# Patient Record
Sex: Female | Born: 1985
Health system: Southern US, Community
[De-identification: ages and names within clinical notes are randomized; demographics above are authoritative.]

## PROBLEM LIST (undated history)

## (undated) ENCOUNTER — Inpatient Hospital Stay (HOSPITAL_COMMUNITY): Payer: Self-pay

## (undated) ENCOUNTER — Emergency Department (HOSPITAL_COMMUNITY): Admission: EM | Payer: Medicaid Other | Source: Home / Self Care

## (undated) DIAGNOSIS — O24419 Gestational diabetes mellitus in pregnancy, unspecified control: Secondary | ICD-10-CM

## (undated) DIAGNOSIS — R51 Headache: Secondary | ICD-10-CM

## (undated) DIAGNOSIS — F410 Panic disorder [episodic paroxysmal anxiety] without agoraphobia: Secondary | ICD-10-CM

## (undated) DIAGNOSIS — R4586 Emotional lability: Secondary | ICD-10-CM

## (undated) DIAGNOSIS — K589 Irritable bowel syndrome without diarrhea: Secondary | ICD-10-CM

## (undated) DIAGNOSIS — E079 Disorder of thyroid, unspecified: Secondary | ICD-10-CM

## (undated) DIAGNOSIS — F419 Anxiety disorder, unspecified: Secondary | ICD-10-CM

## (undated) DIAGNOSIS — A6000 Herpesviral infection of urogenital system, unspecified: Secondary | ICD-10-CM

## (undated) DIAGNOSIS — I10 Essential (primary) hypertension: Secondary | ICD-10-CM

## (undated) DIAGNOSIS — A749 Chlamydial infection, unspecified: Secondary | ICD-10-CM

## (undated) DIAGNOSIS — K219 Gastro-esophageal reflux disease without esophagitis: Secondary | ICD-10-CM

## (undated) DIAGNOSIS — D649 Anemia, unspecified: Secondary | ICD-10-CM

## (undated) DIAGNOSIS — A599 Trichomoniasis, unspecified: Secondary | ICD-10-CM

## (undated) DIAGNOSIS — B999 Unspecified infectious disease: Secondary | ICD-10-CM

## (undated) DIAGNOSIS — G5600 Carpal tunnel syndrome, unspecified upper limb: Secondary | ICD-10-CM

---
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
## 2007-02-17 ENCOUNTER — Emergency Department (HOSPITAL_COMMUNITY): Admission: EM | Admit: 2007-02-17 | Discharge: 2007-02-17 | Payer: Self-pay | Admitting: Family Medicine

## 2007-04-25 ENCOUNTER — Inpatient Hospital Stay (HOSPITAL_COMMUNITY): Admission: AD | Admit: 2007-04-25 | Discharge: 2007-04-25 | Payer: Self-pay | Admitting: Obstetrics

## 2007-04-30 ENCOUNTER — Inpatient Hospital Stay (HOSPITAL_COMMUNITY): Admission: AD | Admit: 2007-04-30 | Discharge: 2007-04-30 | Payer: Self-pay | Admitting: Obstetrics

## 2007-05-02 ENCOUNTER — Encounter (INDEPENDENT_AMBULATORY_CARE_PROVIDER_SITE_OTHER): Payer: Self-pay | Admitting: Obstetrics

## 2007-05-02 ENCOUNTER — Inpatient Hospital Stay (HOSPITAL_COMMUNITY): Admission: AD | Admit: 2007-05-02 | Discharge: 2007-05-02 | Payer: Self-pay | Admitting: Obstetrics

## 2007-05-20 ENCOUNTER — Emergency Department (HOSPITAL_COMMUNITY): Admission: EM | Admit: 2007-05-20 | Discharge: 2007-05-20 | Payer: Self-pay | Admitting: Emergency Medicine

## 2007-05-26 ENCOUNTER — Emergency Department (HOSPITAL_COMMUNITY): Admission: EM | Admit: 2007-05-26 | Discharge: 2007-05-26 | Payer: Self-pay | Admitting: Emergency Medicine

## 2007-06-23 ENCOUNTER — Inpatient Hospital Stay (HOSPITAL_COMMUNITY): Admission: AD | Admit: 2007-06-23 | Discharge: 2007-06-23 | Payer: Self-pay | Admitting: Obstetrics

## 2007-10-02 HISTORY — PX: INDUCED ABORTION: SHX677

## 2007-10-18 ENCOUNTER — Inpatient Hospital Stay (HOSPITAL_COMMUNITY): Admission: AD | Admit: 2007-10-18 | Discharge: 2007-10-18 | Payer: Self-pay | Admitting: Obstetrics

## 2008-01-10 ENCOUNTER — Emergency Department (HOSPITAL_COMMUNITY): Admission: EM | Admit: 2008-01-10 | Discharge: 2008-01-10 | Payer: Self-pay | Admitting: Family Medicine

## 2008-04-02 ENCOUNTER — Inpatient Hospital Stay (HOSPITAL_COMMUNITY): Admission: AD | Admit: 2008-04-02 | Discharge: 2008-04-02 | Payer: Self-pay | Admitting: Obstetrics

## 2008-05-13 ENCOUNTER — Emergency Department (HOSPITAL_COMMUNITY): Admission: EM | Admit: 2008-05-13 | Discharge: 2008-05-14 | Payer: Self-pay | Admitting: Emergency Medicine

## 2008-05-24 ENCOUNTER — Emergency Department (HOSPITAL_COMMUNITY): Admission: EM | Admit: 2008-05-24 | Discharge: 2008-05-24 | Payer: Self-pay | Admitting: Family Medicine

## 2008-06-30 ENCOUNTER — Inpatient Hospital Stay (HOSPITAL_COMMUNITY): Admission: AD | Admit: 2008-06-30 | Discharge: 2008-06-30 | Payer: Self-pay | Admitting: Obstetrics

## 2008-09-01 ENCOUNTER — Ambulatory Visit: Payer: Self-pay | Admitting: Physician Assistant

## 2008-09-01 ENCOUNTER — Inpatient Hospital Stay (HOSPITAL_COMMUNITY): Admission: AD | Admit: 2008-09-01 | Discharge: 2008-09-01 | Payer: Self-pay | Admitting: Obstetrics

## 2008-10-22 ENCOUNTER — Inpatient Hospital Stay (HOSPITAL_COMMUNITY): Admission: AD | Admit: 2008-10-22 | Discharge: 2008-10-22 | Payer: Self-pay | Admitting: Obstetrics

## 2008-11-25 ENCOUNTER — Emergency Department (HOSPITAL_COMMUNITY): Admission: EM | Admit: 2008-11-25 | Discharge: 2008-11-25 | Payer: Self-pay | Admitting: Emergency Medicine

## 2009-01-12 ENCOUNTER — Emergency Department (HOSPITAL_COMMUNITY): Admission: EM | Admit: 2009-01-12 | Discharge: 2009-01-12 | Payer: Self-pay | Admitting: Obstetrics & Gynecology

## 2009-01-28 ENCOUNTER — Ambulatory Visit (HOSPITAL_COMMUNITY): Admission: RE | Admit: 2009-01-28 | Discharge: 2009-01-28 | Payer: Self-pay | Admitting: Obstetrics

## 2009-04-04 ENCOUNTER — Emergency Department (HOSPITAL_COMMUNITY): Admission: EM | Admit: 2009-04-04 | Discharge: 2009-04-05 | Payer: Self-pay | Admitting: Emergency Medicine

## 2009-09-28 ENCOUNTER — Inpatient Hospital Stay (HOSPITAL_COMMUNITY): Admission: AD | Admit: 2009-09-28 | Discharge: 2009-09-28 | Payer: Self-pay | Admitting: Obstetrics

## 2009-09-30 ENCOUNTER — Inpatient Hospital Stay (HOSPITAL_COMMUNITY): Admission: AD | Admit: 2009-09-30 | Discharge: 2009-09-30 | Payer: Self-pay | Admitting: Obstetrics

## 2009-09-30 ENCOUNTER — Ambulatory Visit: Payer: Self-pay | Admitting: Obstetrics and Gynecology

## 2009-10-02 ENCOUNTER — Inpatient Hospital Stay (HOSPITAL_COMMUNITY): Admission: AD | Admit: 2009-10-02 | Discharge: 2009-10-02 | Payer: Self-pay | Admitting: Obstetrics

## 2010-03-02 ENCOUNTER — Emergency Department (HOSPITAL_COMMUNITY): Admission: EM | Admit: 2010-03-02 | Discharge: 2010-03-02 | Payer: Self-pay | Admitting: Emergency Medicine

## 2010-03-31 ENCOUNTER — Emergency Department (HOSPITAL_COMMUNITY): Admission: EM | Admit: 2010-03-31 | Discharge: 2010-03-31 | Payer: Self-pay | Admitting: Emergency Medicine

## 2010-03-31 ENCOUNTER — Encounter: Payer: Self-pay | Admitting: Physician Assistant

## 2010-04-18 ENCOUNTER — Ambulatory Visit: Payer: Self-pay | Admitting: Physician Assistant

## 2010-04-18 DIAGNOSIS — A6009 Herpesviral infection of other urogenital tract: Secondary | ICD-10-CM

## 2010-04-18 DIAGNOSIS — F329 Major depressive disorder, single episode, unspecified: Secondary | ICD-10-CM

## 2010-04-18 DIAGNOSIS — K589 Irritable bowel syndrome without diarrhea: Secondary | ICD-10-CM | POA: Insufficient documentation

## 2010-04-18 DIAGNOSIS — F411 Generalized anxiety disorder: Secondary | ICD-10-CM | POA: Insufficient documentation

## 2010-04-18 DIAGNOSIS — R1013 Epigastric pain: Secondary | ICD-10-CM

## 2010-04-18 DIAGNOSIS — E049 Nontoxic goiter, unspecified: Secondary | ICD-10-CM

## 2010-04-18 DIAGNOSIS — G894 Chronic pain syndrome: Secondary | ICD-10-CM | POA: Insufficient documentation

## 2010-04-18 DIAGNOSIS — K3189 Other diseases of stomach and duodenum: Secondary | ICD-10-CM | POA: Insufficient documentation

## 2010-04-18 DIAGNOSIS — D649 Anemia, unspecified: Secondary | ICD-10-CM

## 2010-04-18 HISTORY — DX: Herpesviral infection of other urogenital tract: A60.09

## 2010-04-18 HISTORY — DX: Nontoxic goiter, unspecified: E04.9

## 2010-04-19 ENCOUNTER — Encounter: Payer: Self-pay | Admitting: Physician Assistant

## 2010-04-19 DIAGNOSIS — E559 Vitamin D deficiency, unspecified: Secondary | ICD-10-CM

## 2010-04-19 LAB — CONVERTED CEMR LAB
Albumin: 4.2 g/dL (ref 3.5–5.2)
Alkaline Phosphatase: 65 units/L (ref 39–117)
BUN: 15 mg/dL (ref 6–23)
CO2: 26 meq/L (ref 19–32)
Calcium: 9.6 mg/dL (ref 8.4–10.5)
Chloride: 104 meq/L (ref 96–112)
Creatinine, Ser: 0.65 mg/dL (ref 0.40–1.20)
Eosinophils Absolute: 0.1 10*3/uL (ref 0.0–0.7)
Eosinophils Relative: 1 % (ref 0–5)
Glucose, Bld: 88 mg/dL (ref 70–99)
HCT: 35.5 % — ABNORMAL LOW (ref 36.0–46.0)
Hemoglobin: 10.9 g/dL — ABNORMAL LOW (ref 12.0–15.0)
Lymphocytes Relative: 45 % (ref 12–46)
Lymphs Abs: 4 10*3/uL (ref 0.7–4.0)
MCV: 76.5 fL — ABNORMAL LOW (ref 78.0–100.0)
Monocytes Absolute: 0.6 10*3/uL (ref 0.1–1.0)
Monocytes Relative: 6 % (ref 3–12)
Potassium: 4.2 meq/L (ref 3.5–5.3)
RBC: 4.64 M/uL (ref 3.87–5.11)
RDW: 16.9 % — ABNORMAL HIGH (ref 11.5–15.5)
Saturation Ratios: 8 % — ABNORMAL LOW (ref 20–55)
TSH: 1.25 microintl units/mL (ref 0.350–4.500)
Vit D, 25-Hydroxy: 18 ng/mL — ABNORMAL LOW (ref 30–89)
WBC: 8.9 10*3/uL (ref 4.0–10.5)

## 2010-04-20 ENCOUNTER — Ambulatory Visit (HOSPITAL_COMMUNITY): Admission: RE | Admit: 2010-04-20 | Discharge: 2010-04-20 | Payer: Self-pay | Admitting: Internal Medicine

## 2010-04-21 ENCOUNTER — Encounter: Payer: Self-pay | Admitting: Physician Assistant

## 2010-05-07 ENCOUNTER — Telehealth: Payer: Self-pay | Admitting: Physician Assistant

## 2010-05-08 ENCOUNTER — Encounter: Payer: Self-pay | Admitting: Physician Assistant

## 2010-05-24 ENCOUNTER — Emergency Department (HOSPITAL_COMMUNITY): Admission: EM | Admit: 2010-05-24 | Discharge: 2010-05-24 | Payer: Self-pay | Admitting: Family Medicine

## 2010-05-29 ENCOUNTER — Ambulatory Visit: Payer: Self-pay | Admitting: Physician Assistant

## 2010-05-29 ENCOUNTER — Telehealth: Payer: Self-pay | Admitting: Physician Assistant

## 2010-05-30 LAB — CONVERTED CEMR LAB: CRP: 3 mg/dL — ABNORMAL HIGH (ref <0.6)

## 2010-06-02 ENCOUNTER — Ambulatory Visit: Payer: Self-pay | Admitting: Physician Assistant

## 2010-06-02 ENCOUNTER — Telehealth: Payer: Self-pay | Admitting: Physician Assistant

## 2010-06-02 DIAGNOSIS — G5600 Carpal tunnel syndrome, unspecified upper limb: Secondary | ICD-10-CM | POA: Insufficient documentation

## 2010-06-02 DIAGNOSIS — K029 Dental caries, unspecified: Secondary | ICD-10-CM | POA: Insufficient documentation

## 2010-06-02 DIAGNOSIS — H538 Other visual disturbances: Secondary | ICD-10-CM

## 2010-06-02 DIAGNOSIS — R700 Elevated erythrocyte sedimentation rate: Secondary | ICD-10-CM

## 2010-06-02 DIAGNOSIS — R350 Frequency of micturition: Secondary | ICD-10-CM

## 2010-06-02 DIAGNOSIS — M722 Plantar fascial fibromatosis: Secondary | ICD-10-CM

## 2010-06-02 LAB — CONVERTED CEMR LAB
Bilirubin Urine: NEGATIVE
Glucose, Urine, Semiquant: NEGATIVE
Ketones, urine, test strip: NEGATIVE
Nitrite: NEGATIVE
Protein, U semiquant: NEGATIVE
Specific Gravity, Urine: 1.025
Urobilinogen, UA: 0.2
pH: 5.5

## 2010-06-03 ENCOUNTER — Encounter: Payer: Self-pay | Admitting: Physician Assistant

## 2010-07-04 ENCOUNTER — Ambulatory Visit: Payer: Self-pay | Admitting: Physician Assistant

## 2010-07-06 LAB — CONVERTED CEMR LAB
Basophils Absolute: 0 10*3/uL (ref 0.0–0.1)
Eosinophils Relative: 1 % (ref 0–5)
HCT: 38.1 % (ref 36.0–46.0)
Hemoglobin: 11.5 g/dL — ABNORMAL LOW (ref 12.0–15.0)
Lymphocytes Relative: 38 % (ref 12–46)
MCHC: 30.2 g/dL (ref 30.0–36.0)
Monocytes Absolute: 0.5 10*3/uL (ref 0.1–1.0)
Monocytes Relative: 6 % (ref 3–12)
Neutro Abs: 4.4 10*3/uL (ref 1.7–7.7)
Platelets: 351 10*3/uL (ref 150–400)
RBC: 4.86 M/uL (ref 3.87–5.11)
RDW: 17.7 % — ABNORMAL HIGH (ref 11.5–15.5)

## 2010-07-17 ENCOUNTER — Encounter: Payer: Self-pay | Admitting: Physician Assistant

## 2010-07-18 ENCOUNTER — Inpatient Hospital Stay (HOSPITAL_COMMUNITY): Admission: AD | Admit: 2010-07-18 | Discharge: 2010-07-18 | Payer: Self-pay | Admitting: Family Medicine

## 2010-07-24 ENCOUNTER — Emergency Department (HOSPITAL_COMMUNITY): Admission: EM | Admit: 2010-07-24 | Discharge: 2010-07-24 | Payer: Self-pay | Admitting: Family Medicine

## 2010-09-20 ENCOUNTER — Emergency Department (HOSPITAL_COMMUNITY)
Admission: EM | Admit: 2010-09-20 | Discharge: 2010-09-20 | Payer: Self-pay | Source: Home / Self Care | Admitting: Family Medicine

## 2010-10-29 LAB — CONVERTED CEMR LAB
Anti Nuclear Antibody(ANA): NEGATIVE
CRP: 2.9 mg/dL — ABNORMAL HIGH (ref <0.6)
Ferritin: 28 ng/mL (ref 10–291)
Rhuematoid fact SerPl-aCnc: <20 intl units/mL (ref 0–20)

## 2010-10-31 NOTE — Assessment & Plan Note (Signed)
Summary: Anxiety; Depression   Vital Signs:  Patient profile:   25 year old female Height:      66 inches Weight:      216 pounds BMI:     34.99 Temp:     98.5 degrees F oral Pulse rate:   76 / minute Pulse rhythm:   regular Resp:     18 per minute BP sitting:   122 / 80  (left arm) Cuff size:   regular  Vitals Entered By: Armenia Shannon (July 04, 2010 8:41 AM) CC: pt says she is here for panic attacks... pt says this happen ever since her child died in 03/04/2005 .Marland Kitchen pt wants to do blood work today.... pt says she needs a note stating her problems.. Is Patient Diabetic? No Pain Assessment Patient in pain? no       Does patient need assistance? Functional Status Self care Ambulation Normal   Primary Care Provider:  Tereso Newcomer PA-C  CC:  pt says she is here for panic attacks... pt says this happen ever since her child died in 04-Mar-2005 .Marland Kitchen pt wants to do blood work today.... pt says she needs a note stating her problems...  History of Present Illness: Here for "panic attacks."  Was going to [facility] of the [place].  Stopped going there.  Was taking Lexapro.  Did not like this medicine.  Makes her feel bad . . . makes panic attacks worse.  Does not feel like it calms her.  Used to be on xanax.  No reported side effects.  PHQ9=24.  Cannot answer question 9.  When asked if she plans to commit suicide, she is clear that she is not.  She laughs and says that this is a silly question.  She denies having any weapons at home.  She has gone to the ED in the past for anxiety.  No admissions to the hospital.  She denies ever attempting suicide.  Denies feeling unsafe.  Notes her family is not supportive for her.  She is having trouble working because of "panic attacks."  Panic attacks:  She gets short of breath, feels lightheaded, has sweaty hands, tingling in hands, chest pain, crying spells.  Feels like she has more panic attacks at work.  Worried she cannot get job done.  Feels stressed  about getting her job done.  Feels like she cannot be still.  Always late to work.  Gets aggravated around other people.  Worried about bills.  Not eating.  States she just got involved with [facility].  She has an appt this week.    Current Medications (verified): 1)  Lexapro 5 Mg Tabs (Escitalopram Oxalate) .... Take 1 Tablet By Mouth Once A Day 2)  Trazodone Hcl 100 Mg Tabs (Trazodone Hcl) .... Take 1 Tab By Mouth At Bedtime As Needed 3)  Valtrex 1 Gm Tabs (Valacyclovir Hcl) .... 1/2 Tab Once Daily 4)  Pepcid 20 Mg Tabs (Famotidine) .... Take 1 Tablet By Mouth Two Times A Day As Needed For Indigestion 5)  Miralax  Powd (Polyethylene Glycol 3350) .... Dissolve One Capful in A Glass of Water and Drink One Glass Daily As Needed 6)  Naproxen 500 Mg Tabs (Naproxen) .... Take 1 Tablet By Mouth Two Times A Day With Food As Needed For Pain 7)  Ferrous Sulfate 325 (65 Fe) Mg Tabs (Ferrous Sulfate) .... Take 1 Tablet By Mouth Three Times A Day 8)  Ergocalciferol 50000 Unit Caps (Ergocalciferol) .... Take  1 By Mouth Once A Week For 12 Weeks 9)  Wrist Splint Misc (Elastic Bandages & Supports) .... Cock Up Wrist Splint; Dx: Carpal Tunnel (Icd-354.0) - Left:    Right:    Bilateral: X  Allergies (verified): No Known Drug Allergies  Family History: DM - MGM Breast CA - PGM Family History Depression - mom, brother  Social History: Single 1 child Never Smoked Alcohol use-no Drug use-no works at [employer] Part time  Review of Systems ENT:  Complains of sore throat; denies earache. Resp:  Denies cough.  Physical Exam  General:  alert, well-developed, and well-nourished.   Head:  normocephalic and atraumatic.   Eyes:  pupils equal, pupils round, and pupils reactive to light.   Ears:  R ear normal and L ear normal.   Mouth:  pharynx pink and moist, no erythema, and no exudates.   Neck:  no cervical lymphadenopathy.   Lungs:  normal breath sounds, no crackles, and no wheezes.     Heart:  normal rate and regular rhythm.   Neurologic:  alert & oriented X3 and cranial nerves II-XII intact.   Psych:  flat affect and slightly anxious.     Impression & Recommendations:  Problem # 1:  DEPRESSION (ICD-311)  very high score on PHQ9 she states she is involved with counseling apparently her counselor could not see her until later this week she was told to come here for earlier eval I am not sure if psychiatry is available will start zoloft hydroxyzine as needed close follow up has multiple somatic complaints that I think are related to her depression  The following medications were removed from the medication list:    Lexapro 5 Mg Tabs (Escitalopram oxalate) .Marland Kitchen... Take 1 tablet by mouth once a day    Trazodone Hcl 100 Mg Tabs (Trazodone hcl) .Marland Kitchen... Take 1 tab by mouth at bedtime as needed Her updated medication list for this problem includes:    Zoloft 50 Mg Tabs (Sertraline hcl) .Marland Kitchen... Take 1 tab by mouth at bedtime    Hydroxyzine Hcl 25 Mg Tabs (Hydroxyzine hcl) .Marland Kitchen... Take 1 tablet by mouth no more than two times a day as needed for anxiety/panic  Orders: Psychology Referral (Psychology)  Problem # 2:  ANXIETY (ICD-300.00)  very high score on PHQ9 she states she is involved with counseling apparently her counselor could not see her until later this week she was told to come here for earlier eval I am not sure if psychiatry is available will start zoloft hydroxyzine as needed close follow up has multiple somatic complaints that I think are related to her depression  The following medications were removed from the medication list:    Lexapro 5 Mg Tabs (Escitalopram oxalate) .Marland Kitchen... Take 1 tablet by mouth once a day    Trazodone Hcl 100 Mg Tabs (Trazodone hcl) .Marland Kitchen... Take 1 tab by mouth at bedtime as needed Her updated medication list for this problem includes:    Zoloft 50 Mg Tabs (Sertraline hcl) .Marland Kitchen... Take 1 tab by mouth at bedtime    Hydroxyzine Hcl 25 Mg  Tabs (Hydroxyzine hcl) .Marland Kitchen... Take 1 tablet by mouth no more than two times a day as needed for anxiety/panic  Orders: Psychology Referral (Psychology)  Problem # 3:  ANEMIA (ICD-285.9)  due for f/u cbc  Her updated medication list for this problem includes:    Ferrous Sulfate 325 (65 Fe) Mg Tabs (Ferrous sulfate) .Marland Kitchen... Take 1 tablet by mouth three times a day  Orders: T-CBC w/Diff ([id number])  Complete Medication List: 1)  Valtrex 1 Gm Tabs (Valacyclovir hcl) .... 1/2 tab once daily 2)  Pepcid 20 Mg Tabs (Famotidine) .... Take 1 tablet by mouth two times a day as needed for indigestion 3)  Miralax Powd (Polyethylene glycol 3350) .... Dissolve one capful in a glass of water and drink one glass daily as needed 4)  Naproxen 500 Mg Tabs (Naproxen) .... Take 1 tablet by mouth two times a day with food as needed for pain 5)  Ferrous Sulfate 325 (65 Fe) Mg Tabs (Ferrous sulfate) .... Take 1 tablet by mouth three times a day 6)  Ergocalciferol 50000 Unit Caps (Ergocalciferol) .... Take 1 by mouth once a week for 12 weeks 7)  Wrist Splint Misc (Elastic bandages & supports) .... Cock up wrist splint; dx: carpal tunnel (icd-354.0) - left:    right:    bilateral: x 8)  Zoloft 50 Mg Tabs (Sertraline hcl) .... Take 1 tab by mouth at bedtime 9)  Hydroxyzine Hcl 25 Mg Tabs (Hydroxyzine hcl) .... Take 1 tablet by mouth no more than two times a day as needed for anxiety/panic  Other Orders: Flu Vaccine 52yrs + (09811) Admin 1st Vaccine (91478) Admin 1st Vaccine [facility]) [phone])  Patient Instructions: 1)  Take the Zoloft in the evening.  Take with food. 2)  You will not notice much of an effect for about 4 weeks with the Zoloft. 3)  I need the phone number of your counselor.  I need to give him/her a call to make sure you are getting close follow up. 4)  Take the hydroxyzine only if you need it.  You can not take it more than every 12 hours.  You can only get 30 tabs in one month. 5)  Schedule appt  with Ethelene Browns (next available). 6)  Schedule appt with provider in 2 weeks. Prescriptions: HYDROXYZINE HCL 25 MG TABS (HYDROXYZINE HCL) Take 1 tablet by mouth no more than two times a day as needed for anxiety/panic  #30 per month x 1   Entered and Authorized by:   Tereso Newcomer PA-C   Signed by:   Tereso Newcomer PA-C on 07/04/2010   Method used:   Print then Give to Patient   RxID:   [id number] ZOLOFT 50 MG TABS (SERTRALINE HCL) Take 1 tab by mouth at bedtime  #30 x 1   Entered and Authorized by:   Tereso Newcomer PA-C   Signed by:   Tereso Newcomer PA-C on 07/04/2010   Method used:   Print then Give to Patient   RxID:   [id number]      Influenza Vaccine    Vaccine Type: Fluvax 3+    Site: right deltoid    Dose: 0.5 ml    Route: IM    Given by: Armenia Shannon    Exp. Date: 03/2011    Lot #: [id number]    VIS given: 04/25/10 version given July 04, 2010.  Flu Vaccine Consent Questions    Do you have a history of severe allergic reactions to this vaccine? no    Any prior history of allergic reactions to egg and/or gelatin? no    Do you have a sensitivity to the preservative Thimersol? no    Do you have a past history of Guillan-Barre Syndrome? no    Do you currently have an acute febrile illness? no    Have you ever had a severe reaction to latex? no  Vaccine information given and explained to patient? yes    Are you currently pregnant? no

## 2010-10-31 NOTE — Letter (Signed)
Summary: RADIOLOGY ORDER  RADIOLOGY ORDER   Imported By: Arta Bruce 04/19/2010 15:17:11  _____________________________________________________________________  External Attachment:    Type:   Image     Comment:   External Document

## 2010-10-31 NOTE — Progress Notes (Signed)
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 Initial call taken by: CMA Student  Follow-up for Phone Call        I spoke to her last time.  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 A instructions are to skip all questions and sign form if answer is "no."  If she can resume training the answer is no. That is why nothing else was filled out. IF she has a physical limitation that precludes her from attending training/welfare reform, I need her to schedule an appt with me so I can discuss this with her and examine her again so that I fill this form out correctly.  Follow-up by: Tereso Newcomer PA-C,  May 29, 2010 2:35 PM  Additional Follow-up for Phone Call Additional follow up Details #1::        spoke with pt and she says just check part time and that is it Additional Follow-up by: Armenia Shannon,  May 30, 2010 2:48 PM

## 2010-10-31 NOTE — Letter (Signed)
Summary: NO PLAN TO HURT SELF  NO PLAN TO HURT SELF   Imported By: Arta Bruce 07/04/2010 11:23:51  _____________________________________________________________________  External Attachment:    Type:   Image     Comment:   External Document

## 2010-10-31 NOTE — Progress Notes (Signed)
Summary: Office Visit//DEPRESSION SCREENING  Office Visit//DEPRESSION SCREENING   Imported By: Arta Bruce 04/24/2010 12:32:28  _____________________________________________________________________  External Attachment:    Type:   Image     Comment:   External Document

## 2010-10-31 NOTE — Assessment & Plan Note (Signed)
Summary: NEW MEDICAID PT//GK   Vital Signs:  Patient profile:   25 year old female Weight:      217 pounds Temp:     97.8 degrees F oral Pulse rate:   88 / minute Pulse rhythm:   regular Resp:     18 per minute BP sitting:   121 / 76  (left arm) Cuff size:   regular  Vitals Entered By: Armenia Shannon (April 18, 2010 2:56 PM) CC: np.... pt says she has body pain..... Is Patient Diabetic? No Pain Assessment Patient in pain? no       Does patient need assistance? Functional Status Self care Ambulation Normal   Primary Care Provider:  Tereso Newcomer PA-C  CC:  np.... pt says she has body pain......  History of Present Illness: New patient.  Pain:  Notes pain in her hands for 5 months.  Hurts most of the time.  Nothing particularly makes worse.  No repetetive hand motion.  No injury.  Notes bilat. heel pain.  Denies pain in mornings.  Does have some stiffness.  No injury.  Notes abdominal pain from time to time.  Admits to cramping and periods of constipation followed by diarrhea.  No unintended weight loss.  Cramping improves with having a stool.  She went to the ED several weeks ago for chest pain.  She had normal CEs and was d/c home.  She has not had a recurrence.  She notes occ. dyspepsia.  Has some trouble with swallowing at times but no dysphagia.  No water brash.  She is seeing [facility] of the [place].  She is on Lexapro but is having some SEs.  She sees her counselor again in a few weeks.  She is going to discuss changing her meds.    Habits & Providers  Alcohol-Tobacco-Diet     Tobacco Status: never  Exercise-Depression-Behavior     Drug Use: no     Gynecologist: Dr. Francoise Ceo  Allergies (verified): No Known Drug Allergies  Past History:  Past Medical History: Depression   a.  goes to [facility] the [place]   b.  gets meds from them Genital Herpes   a. Valtrex per Dr. Gaynell Face Anxiety  Family History: DM - MGM Breast CA -  PGM  Social History: Single Never Smoked Alcohol use-no Drug use-no Smoking Status:  never Drug Use:  no  Review of Systems       see HPI rest of ROS neg.  Physical Exam  General:  alert, well-developed, and well-nourished.   Head:  normocephalic and atraumatic.   Eyes:  pupils equal, pupils round, and pupils reactive to light.   Neck:  supple and thyromegaly.   Lungs:  normal breath sounds.   Heart:  normal rate and regular rhythm.   Abdomen:  soft.   diffusely tender  no masses no hepatomegaly  Msk:  no evidence of synovitis bilat hands Tinnels and Phalens neg bilat feet without deform and no pain with palp over medial and lateral malleoli  Neurologic:  alert & oriented X3 and cranial nerves II-XII intact.   Psych:  normally interactive.     Impression & Recommendations:  Problem # 1:  DEPRESSION (ICD-311) PHQ9=17 needs to f/u with psych and have her meds adjusted (sees [facility] [place])   Her updated medication list for this problem includes:    Lexapro 5 Mg Tabs (Escitalopram oxalate) .Marland Kitchen... Take 1 tablet by mouth once a day    Trazodone  Hcl 100 Mg Tabs (Trazodone hcl) .Marland Kitchen... Take 1 tab by mouth at bedtime as needed  Problem # 2:  CHRONIC PAIN SYNDROME (ICD-338.4)  I suspect her pain is related to depression she needs to discuss with her psych to see if her meds should be changed ? if she would benefit from cymbalta will provide her with a letter to take with her will check labs; check ESR  Orders: T-Comprehensive Metabolic Panel ([id number]) T-Sed Rate (Automated) ([id number])  Problem # 3:  ANEMIA (ICD-285.9)  Hb 11.4 with MCV 74.6 in ED earlier this month check iron levels and repeat cbc may be related to her cycles . . . she sees GYN (Dr. Gaynell Face)  Orders: T-CBC w/Diff [phone]) T-Iron [phone]) T-Iron Binding Capacity (TIBC) ([address]) T-Ferritin ([phone])  Problem # 4:  IRRITABLE BOWEL SYNDROME  (ICD-564.1)  suspect related to depression will give her high fiber diet and Rx for miralax to use as needed  Orders: T-Comprehensive Metabolic Panel ([id number]) T-TSH ([id number])  Problem # 5:  DYSPEPSIA (ICD-536.8) may be cause of chest pain will have her start pepcid as needed  Problem # 6:  THYROMEGALY (ICD-240.9)  chekc u/s check TFTs  Orders: T-TSH ([id number]) T-T4, Free (16606-30160) Ultrasound (Ultrasound)  Problem # 7:  PREVENTIVE HEALTH CARE (ICD-V70.0)  has paps with Dr. 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 Take 1 tablet by mouth once a day 2)  Trazodone Hcl 100 Mg Tabs (Trazodone hcl) .... Take 1 tab by mouth at bedtime as needed 3)  Valtrex 1 Gm Tabs (Valacyclovir hcl) .... 1/2 tab once daily 4)  Pepcid 20 Mg Tabs (Famotidine) .... Take 1 tablet by mouth two times a day as needed for indigestion 5)  Miralax Powd (Polyethylene glycol 3350) .... Dissolve one capful in a glass of water and drink one glass daily as needed 6)  Naproxen 500 Mg Tabs (Naproxen) .... Take 1 tablet by mouth two times a day with food as needed for pain  Patient Instructions: 1)  Follow high fiber diet. 2)  Use Metamucil daily.  If this is not enough to keep you regular, get the Miralax. 3)  Drink plenty of fluids. 4)  Follow up with your psychiatrist and discuss your pain with them.  Ask them if they think you would do good with a medicine called Cymbalta. 5)  Get the thyroid ultrasound. 6)  Continue getting your paps with Dr. Gaynell Face once a year. 7)  We will follow up with you on your tests.  Schedule follow up with Lorin Picket as needed if any of your symptoms do not improve or worsen. Prescriptions: NAPROXEN 500 MG TABS (NAPROXEN) Take 1 tablet by mouth two times a day with food as needed for pain   #30 x 2   Entered and Authorized by:   Tereso Newcomer PA-C   Signed by:   Tereso Newcomer PA-C on 04/18/2010   Method used:   Print then Give to Patient   RxID:   [id number] MIRALAX  POWD (POLYETHYLENE GLYCOL 3350) dissolve one capful in a glass of water and drink one glass daily as needed  #1 bottle x 2   Entered and Authorized by:   Tereso Newcomer PA-C   Signed by:   Tereso Newcomer PA-C on 04/18/2010   Method used:   Print then Give to Patient   RxID:  [id number] PEPCID 20 MG TABS (FAMOTIDINE) Take 1 tablet by mouth two times a day as needed for indigestion  #30 x 2   Entered and Authorized by:   Tereso Newcomer PA-C   Signed by:   Tereso Newcomer PA-C on 04/18/2010   Method used:   Print then Give to Patient   RxID:   [id number]

## 2010-10-31 NOTE — Letter (Signed)
Summary: Generic Letter  [facility]  [address] [place], [place] [address]   Phone: [phone]  Fax: [phone]    07/04/2010  To whom it may concern:  Melinda Jimenez (DOB March 09, 1986) is my patient and is currently under my care.  She will need frequent follow up and will have to take some medications as needed.  Her health problems may limit her ability, at times, to remain in training or at work.  She should be allowed to limit her schedule as she sees fit as it relates to her symptoms.           Sincerely,   Tereso Newcomer PA-C

## 2010-10-31 NOTE — Letter (Signed)
Summary: REPORT OF MEDICAL EXAMINATION   REPORT OF MEDICAL EXAMINATION   Imported By: Arta Bruce 05/15/2010 16:12:35  _____________________________________________________________________  External Attachment:    Type:   Image     Comment:   External Document

## 2010-10-31 NOTE — Letter (Signed)
Summary: MED SOLUTIONS/APPROVED  MED SOLUTIONS/APPROVED   Imported By: Arta Bruce 04/24/2010 14:31:01  _____________________________________________________________________  External Attachment:    Type:   Image     Comment:   External Document

## 2010-10-31 NOTE — Letter (Signed)
Summary: PT INFORMATION SHEET  PT INFORMATION SHEET   Imported By: Arta Bruce 04/19/2010 09:25:39  _____________________________________________________________________  External Attachment:    Type:   Image     Comment:   External Document

## 2010-10-31 NOTE — Assessment & Plan Note (Signed)
Summary: F/U PER [facility]OTT/TMM   Vital Signs:  Patient profile:   25 year old female LMP:     05/31/2010 Height:      66 inches Weight:      225 pounds BMI:     36.45 Temp:     98.1 degrees F oral Pulse rate:   88 / minute Pulse rhythm:   regular Resp:     18 per minute BP sitting:   120 / 80  (left arm) Cuff size:   regular  Vitals Entered By: [place] Shannon (June 02, 2010 9:02 AM) CC: f/u... Is Patient Diabetic? No Pain Assessment Patient in pain? no       Does patient need assistance? Functional Status Self care Ambulation Normal LMP (date): 05/31/2010     Enter LMP: 05/31/2010   Primary Care Provider:  Tereso Newcomer PA-C  CC:  f/u....  History of Present Illness: 25 year old female presents for followup.  She had some hand pain when I initially saw her.  Her sedimentation rate was minimally elevated at 45.  Her CRP was also elevated.  Her ANA and rheumatoid factors were both negative.  A repeat sedimentation rate was still elevated.  I brought her in today for further evaluation.  She's not had any symptoms of infection recently.  She does have some tooth pain from time to time on her left upper molar.  She does have some urinary frequency.  She has some blood in her urine today.  Her hand pain seems to be worse when she uses her hands more.  Question whether or not there is worsening pain in the mornings.  She has foot pain at her heels bilaterally.  This is worse in the mornings.  She has some cramps at night.  She has some stiffness and has to walk on her feet for a while before her symptoms are better.  She wears sandals most of the time.  She occasionally wears tennis shoes.  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 Take 1 Tablet By Mouth Once A Day 2)  Trazodone Hcl 100 Mg Tabs (Trazodone Hcl) .... Take 1 Tab By Mouth At Bedtime As Needed 3)  Valtrex 1 Gm Tabs (Valacyclovir Hcl) .... 1/2 Tab Once Daily 4)  Pepcid 20 Mg Tabs (Famotidine) .... Take 1 Tablet By Mouth Two Times A Day As Needed For Indigestion 5)  Miralax  Powd (Polyethylene Glycol 3350) .... Dissolve One Capful in A Glass of Water and Drink One Glass Daily As Needed 6)  Naproxen 500 Mg Tabs (Naproxen) .... Take 1 Tablet By Mouth Two Times A Day With Food As Needed For Pain 7)  Ferrous Sulfate 325 (65 Fe) Mg Tabs (Ferrous Sulfate) .Marland KitchenMarland KitchenMarland Kitchen  Take 1 Tablet By Mouth Three Times A Day 8)  Ergocalciferol 50000 Unit Caps (Ergocalciferol) .... Take 1 By Mouth Once A Week For 12 Weeks  Allergies (verified): No Known Drug Allergies  Past History:  Past Medical History: Last updated: 04/21/2010 Depression   a.  goes to [facility] of the [place]   b.  gets meds from them Genital Herpes   a. Valtrex per Dr. Gaynell Face Anxiety Goiter   a.  thyroid u/s 7.2011 with mild thyromegaly   b.  TFTs 7.2011 normal  Review of Systems      See HPI General:  Denies fever. Eyes:  Complains of blurring. ENT:  Denies sore throat. CV:  Complains of chest pain or discomfort; denies shortness of breath with exertion. Resp:  Denies cough and sputum productive. GI:  Complains of diarrhea; denies bloody stools and dark tarry stools. GU:  Complains of urinary  frequency and urinary hesitancy; denies dysuria and hematuria. Derm:  Denies lesion(s) and rash. Psych:  Complains of depression; sees psych. Heme:  Denies bleeding.  Physical Exam  General:  alert, well-developed, and well-nourished.   Head:  normocephalic and atraumatic.   Eyes:  pupils equal, pupils round, pupils reactive to light, and no injection.  small cyst on edge of lower lid on right  Ears:  R ear normal and L ear normal.   Mouth:  pharynx pink and moist and fair dentition.  caries noted left upper molar Neck:  supple and no cervical lymphadenopathy.   Lungs:  normal breath sounds, no crackles, and no wheezes.   Heart:  normal rate and regular rhythm.   Msk:  no synovitis  +phalens +/- tinels  ? tend with palp over heels bilat  Extremities:  no edema  Neurologic:  alert & oriented X3 and cranial nerves II-XII intact.   Skin:  no rashes and no suspicious lesions.   Psych:  normally interactive.     Impression & Recommendations:  Problem # 1:  FREQUENCY, URINARY (ICD-788.41)  Orders: UA Dipstick W/ Micro (manual) (84132) T-Culture, Urine (44010-27253)  Problem # 2:  CARPAL TUNNEL SYNDROME (ICD-354.0) braces q nite nsaids as needed   Problem # 3:  FASCIITIS, PLANTAR (ICD-728.71) handout given proper foot wear discussed nsaids as needed  Her updated medication list for this problem includes:    Naproxen 500 Mg Tabs (Naproxen) .Marland Kitchen... Take 1 tablet by mouth two times a day with food as needed for pain  Problem # 4:  ESR, ELEVATED (ICD-790.1) may be related to CTS, Plantar fasciitis, dental issues no sig findings on exam or ROS repeat in 3 mos  Problem # 5:  DENTAL CARIES (ICD-521.00)  Orders: Dental Referral (Dentist)  Problem # 6:  BLURRED VISION (ICD-368.8)  Orders: Ophthalmology Referral (Ophthalmology)  Complete Medication List: 1)  Lexapro 5 Mg Tabs (Escitalopram oxalate) .... Take 1 tablet by mouth once a day 2)  Trazodone Hcl 100 Mg Tabs  (Trazodone hcl) .... Take 1 tab by mouth at bedtime as needed 3)  Valtrex 1 Gm Tabs (Valacyclovir hcl) .... 1/2 tab once daily 4)  Pepcid 20 Mg Tabs (Famotidine) .... Take 1 tablet by mouth two times a day as needed for indigestion 5)  Miralax Powd (Polyethylene glycol 3350) .... Dissolve one capful in a glass of water and drink one glass daily as needed 6)  Naproxen 500 Mg Tabs (Naproxen) .... Take 1 tablet by mouth two times a day with food as needed for pain 7)  Ferrous Sulfate  325 (65 Fe) Mg Tabs (Ferrous sulfate) .... Take 1 tablet by mouth three times a day 8)  Ergocalciferol 50000 Unit Caps (Ergocalciferol) .... Take 1 by mouth once a week for 12 weeks 9)  Wrist Splint Misc (Elastic bandages & supports) .... Cock up wrist splint; dx: carpal tunnel (icd-354.0) - left:    right:    bilateral: x  Patient Instructions: 1)  Lab visit in 3 months:  CRP and ESR 2)  Arrange follow up if hands and feet no better in 2-3 months or sooner if worse. 3)  Someone will arrange: 4)  Dental and Eye appts. 5)  Follow handout for your feet. 6)  Get comfortable tennis shoes for your feet. 7)  Wear the wrist brace at bedtime every night. Prescriptions: WRIST SPLINT MISC (ELASTIC BANDAGES & SUPPORTS) Cock Up Wrist Splint; Dx: Carpal Tunnel (ICD-354.0) - Left:    Right:    Bilateral: x  #1 RT & 1 LT x 0   Entered and Authorized by:   Tereso Newcomer PA-C   Signed by:   Tereso Newcomer PA-C on 06/02/2010   Method used:   Print then Give to Patient   RxID:   [id number]   Laboratory Results   Urine Tests    Routine Urinalysis   Glucose: negative   (Normal Range: Negative) Bilirubin: negative   (Normal Range: Negative) Ketone: negative   (Normal Range: Negative) Spec. Gravity: 1.025   (Normal Range: 1.003-1.035) Blood: large   (Normal Range: Negative) pH: 5.5   (Normal Range: 5.0-8.0) Protein: negative   (Normal Range: Negative) Urobilinogen: 0.2   (Normal Range: 0-1) Nitrite: negative   (Normal  Range: Negative) Leukocyte Esterace: negative   (Normal Range: Negative)

## 2010-10-31 NOTE — Letter (Signed)
Summary: NOTE /HOSPITAL  NOTE /HOSPITAL   Imported By: Arta Bruce 04/24/2010 12:37:28  _____________________________________________________________________  External Attachment:    Type:   Image     Comment:   External Document

## 2010-10-31 NOTE — Progress Notes (Signed)
  Phone Note Outgoing Call   Summary of Call: I have a form for completion form Melinda Jimenez I do not know of any reason why she is incapacitated. If she feels her depession is keeping her from particpating in welfare reform program activities, she should bring this form to her psychiatrist for completion  Initial call taken by: Brynda Rim,  May 07, 2010 9:00 PM  Follow-up for Phone Call        pt says she is not seeing any one else... 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 Told her to pick up tomorrow Tereso Newcomer PA-C  May 08, 2010 12:05 PM     Additional Follow-up for Phone Call Additional follow up Details #2::    thanks Follow-up by: [place] Shannon,  May 08, 2010 12:44 PM

## 2010-10-31 NOTE — Letter (Signed)
Summary: MAILED REQUESTED RECORDS TO [facility]  MAILED REQUESTED RECORDS TO [facility]   Imported By: Arta Bruce 07/17/2010 15:24:01  _____________________________________________________________________  External Attachment:    Type:   Image     Comment:   External Document

## 2010-10-31 NOTE — Progress Notes (Signed)
Summary: Ophthalmology and Dental referral  Phone Note Outgoing Call   Summary of Call: 1. refer to dental for dental caries and pain  2. refer to ophthalmology for visual acuity Initial call taken by: Brynda Rim,  June 02, 2010 2:27 PM  Follow-up for Phone Call        PT HAVE AN APPT [facility]  07-04-10 @ 2:15 PM . LVM TO PT TO RETURN MY CALL Follow-up by: Cheryll Dessert,  June 12, 2010 8:50 AM

## 2010-11-16 NOTE — Letter (Signed)
Summary: PSYCHOLOGY SUMMARY  PSYCHOLOGY SUMMARY   Imported By: Arta Bruce 11/07/2010 10:07:29  _____________________________________________________________________  External Attachment:    Type:   Image     Comment:   External Document

## 2010-12-11 LAB — WET PREP, GENITAL: Clue Cells Wet Prep HPF POC: NONE SEEN

## 2010-12-11 LAB — POCT URINALYSIS DIPSTICK
Glucose, UA: NEGATIVE mg/dL
Ketones, ur: 15 mg/dL — AB
Protein, ur: 100 mg/dL — AB
Specific Gravity, Urine: 1.025 (ref 1.005–1.030)
Urobilinogen, UA: 1 mg/dL (ref 0.0–1.0)
pH: 6.5 (ref 5.0–8.0)

## 2010-12-11 LAB — POCT PREGNANCY, URINE: Preg Test, Ur: NEGATIVE

## 2010-12-11 LAB — GC/CHLAMYDIA PROBE AMP, GENITAL
Chlamydia, DNA Probe: NEGATIVE
GC Probe Amp, Genital: NEGATIVE

## 2010-12-13 LAB — GC/CHLAMYDIA PROBE AMP, GENITAL: Chlamydia, DNA Probe: NEGATIVE

## 2010-12-13 LAB — URINALYSIS, ROUTINE W REFLEX MICROSCOPIC
Glucose, UA: NEGATIVE mg/dL
Ketones, ur: NEGATIVE mg/dL
Nitrite: NEGATIVE
Protein, ur: NEGATIVE mg/dL
Urobilinogen, UA: 1 mg/dL (ref 0.0–1.0)

## 2010-12-13 LAB — POCT URINALYSIS DIPSTICK
Hgb urine dipstick: NEGATIVE
Nitrite: NEGATIVE
Protein, ur: 30 mg/dL — AB
Specific Gravity, Urine: 1.02 (ref 1.005–1.030)
Urobilinogen, UA: 1 mg/dL (ref 0.0–1.0)
pH: 7 (ref 5.0–8.0)

## 2010-12-13 LAB — POCT PREGNANCY, URINE: Preg Test, Ur: NEGATIVE

## 2010-12-13 LAB — WET PREP, GENITAL: Trich, Wet Prep: NONE SEEN

## 2010-12-15 LAB — GC/CHLAMYDIA PROBE AMP, GENITAL
Chlamydia, DNA Probe: NEGATIVE
GC Probe Amp, Genital: NEGATIVE

## 2010-12-15 LAB — POCT URINALYSIS DIPSTICK
Bilirubin Urine: NEGATIVE
Ketones, ur: NEGATIVE mg/dL
Nitrite: NEGATIVE
Specific Gravity, Urine: 1.02 (ref 1.005–1.030)
pH: 5.5 (ref 5.0–8.0)

## 2010-12-15 LAB — POCT PREGNANCY, URINE: Preg Test, Ur: NEGATIVE

## 2010-12-15 LAB — WET PREP, GENITAL
Clue Cells Wet Prep HPF POC: NONE SEEN
Trich, Wet Prep: NONE SEEN

## 2010-12-17 LAB — POCT CARDIAC MARKERS
CKMB, poc: <1 ng/mL — ABNORMAL LOW (ref 1.0–8.0)
Troponin i, poc: <0.05 ng/mL (ref 0.00–0.09)

## 2010-12-17 LAB — DIFFERENTIAL
Basophils Absolute: 0.1 10*3/uL (ref 0.0–0.1)
Eosinophils Relative: 1 % (ref 0–5)
Lymphs Abs: 3.5 10*3/uL (ref 0.7–4.0)
Monocytes Absolute: 0.6 10*3/uL (ref 0.1–1.0)
Monocytes Relative: 6 % (ref 3–12)
Neutro Abs: 6.4 10*3/uL (ref 1.7–7.7)
Neutrophils Relative %: 59 % (ref 43–77)

## 2010-12-17 LAB — CBC
HCT: 35.1 % — ABNORMAL LOW (ref 36.0–46.0)
Hemoglobin: 11.4 g/dL — ABNORMAL LOW (ref 12.0–15.0)
MCH: 24.3 pg — ABNORMAL LOW (ref 26.0–34.0)
MCHC: 32.6 g/dL (ref 30.0–36.0)
MCV: 74.6 fL — ABNORMAL LOW (ref 78.0–100.0)
RBC: 4.71 MIL/uL (ref 3.87–5.11)
RDW: 17 % — ABNORMAL HIGH (ref 11.5–15.5)

## 2010-12-17 LAB — POCT I-STAT, CHEM 8
Calcium, Ion: 1.18 mmol/L (ref 1.12–1.32)
Chloride: 105 mEq/L (ref 96–112)
Creatinine, Ser: 0.8 mg/dL (ref 0.4–1.2)
Glucose, Bld: 93 mg/dL (ref 70–99)
Potassium: 3.8 mEq/L (ref 3.5–5.1)

## 2010-12-17 LAB — HCG, QUANTITATIVE, PREGNANCY: hCG, Beta Chain, Quant, S: 1087 m[IU]/mL — ABNORMAL HIGH (ref <5)

## 2010-12-17 LAB — POCT PREGNANCY, URINE: Preg Test, Ur: NEGATIVE

## 2010-12-18 LAB — CBC
MCHC: 32.6 g/dL (ref 30.0–36.0)
Platelets: 333 10*3/uL (ref 150–400)
RBC: 4.83 MIL/uL (ref 3.87–5.11)
WBC: 9.6 10*3/uL (ref 4.0–10.5)

## 2011-01-01 LAB — URINALYSIS, ROUTINE W REFLEX MICROSCOPIC
Glucose, UA: NEGATIVE mg/dL
Glucose, UA: NEGATIVE mg/dL
Ketones, ur: NEGATIVE mg/dL
Leukocytes, UA: NEGATIVE
Leukocytes, UA: NEGATIVE
Nitrite: NEGATIVE
Protein, ur: NEGATIVE mg/dL
Protein, ur: NEGATIVE mg/dL
Specific Gravity, Urine: 1.015 (ref 1.005–1.030)
Urobilinogen, UA: 0.2 mg/dL (ref 0.0–1.0)
pH: 6 (ref 5.0–8.0)
pH: 7.5 (ref 5.0–8.0)

## 2011-01-01 LAB — WET PREP, GENITAL: Yeast Wet Prep HPF POC: NONE SEEN

## 2011-01-01 LAB — URINE MICROSCOPIC-ADD ON

## 2011-01-01 LAB — HCG, QUANTITATIVE, PREGNANCY: hCG, Beta Chain, Quant, S: 1882 m[IU]/mL — ABNORMAL HIGH (ref <5)

## 2011-01-01 LAB — ABO/RH: ABO/RH(D): A POS

## 2011-01-01 LAB — CBC
Hemoglobin: 11.4 g/dL — ABNORMAL LOW (ref 12.0–15.0)
Platelets: 318 10*3/uL (ref 150–400)
RDW: 16.5 % — ABNORMAL HIGH (ref 11.5–15.5)

## 2011-01-06 ENCOUNTER — Emergency Department (HOSPITAL_COMMUNITY): Payer: Medicaid Other

## 2011-01-06 ENCOUNTER — Emergency Department (HOSPITAL_COMMUNITY)
Admission: EM | Admit: 2011-01-06 | Discharge: 2011-01-07 | Disposition: A | Discharge status: Home / Self Care | Payer: Medicaid Other | Attending: Emergency Medicine | Admitting: Emergency Medicine

## 2011-01-06 DIAGNOSIS — R209 Unspecified disturbances of skin sensation: Secondary | ICD-10-CM | POA: Insufficient documentation

## 2011-01-06 DIAGNOSIS — G589 Mononeuropathy, unspecified: Secondary | ICD-10-CM | POA: Insufficient documentation

## 2011-01-06 DIAGNOSIS — R0602 Shortness of breath: Secondary | ICD-10-CM | POA: Insufficient documentation

## 2011-01-06 DIAGNOSIS — R079 Chest pain, unspecified: Secondary | ICD-10-CM | POA: Insufficient documentation

## 2011-01-06 DIAGNOSIS — R109 Unspecified abdominal pain: Secondary | ICD-10-CM | POA: Insufficient documentation

## 2011-01-06 LAB — POCT PREGNANCY, URINE: Preg Test, Ur: NEGATIVE

## 2011-01-07 LAB — URINALYSIS, ROUTINE W REFLEX MICROSCOPIC
Bilirubin Urine: NEGATIVE
Glucose, UA: NEGATIVE mg/dL
Hgb urine dipstick: NEGATIVE
Hgb urine dipstick: NEGATIVE
Ketones, ur: NEGATIVE mg/dL
Ketones, ur: NEGATIVE mg/dL
Nitrite: NEGATIVE
Nitrite: NEGATIVE
Protein, ur: NEGATIVE mg/dL
Protein, ur: NEGATIVE mg/dL
Specific Gravity, Urine: 1.025 (ref 1.005–1.030)
Specific Gravity, Urine: 1.029 (ref 1.005–1.030)
Urobilinogen, UA: 0.2 mg/dL (ref 0.0–1.0)
Urobilinogen, UA: 1 mg/dL (ref 0.0–1.0)
pH: 5.5 (ref 5.0–8.0)
pH: 7 (ref 5.0–8.0)

## 2011-01-07 LAB — URINE MICROSCOPIC-ADD ON

## 2011-01-07 LAB — RPR: RPR Ser Ql: NONREACTIVE

## 2011-01-07 LAB — POCT I-STAT, CHEM 8
Creatinine, Ser: 0.8 mg/dL (ref 0.4–1.2)
HCT: 39 % (ref 36.0–46.0)
Hemoglobin: 13.3 g/dL (ref 12.0–15.0)
Potassium: 3.5 mEq/L (ref 3.5–5.1)
Sodium: 142 mEq/L (ref 135–145)
TCO2: 24 mmol/L (ref 0–100)

## 2011-01-07 LAB — WET PREP, GENITAL
Clue Cells Wet Prep HPF POC: NONE SEEN
Trich, Wet Prep: NONE SEEN
Yeast Wet Prep HPF POC: NONE SEEN

## 2011-01-07 LAB — POCT PREGNANCY, URINE: Preg Test, Ur: NEGATIVE

## 2011-01-07 LAB — GC/CHLAMYDIA PROBE AMP, GENITAL
Chlamydia, DNA Probe: POSITIVE — AB
GC Probe Amp, Genital: NEGATIVE

## 2011-01-10 LAB — POCT URINALYSIS DIP (DEVICE)
Hgb urine dipstick: NEGATIVE
Ketones, ur: NEGATIVE mg/dL
Protein, ur: 30 mg/dL — AB
Specific Gravity, Urine: 1.015 (ref 1.005–1.030)
pH: 8.5 — ABNORMAL HIGH (ref 5.0–8.0)

## 2011-01-15 LAB — URINALYSIS, ROUTINE W REFLEX MICROSCOPIC
Bilirubin Urine: NEGATIVE
Glucose, UA: NEGATIVE mg/dL
Ketones, ur: NEGATIVE mg/dL
Nitrite: NEGATIVE
pH: 6.5 (ref 5.0–8.0)

## 2011-01-15 LAB — GC/CHLAMYDIA PROBE AMP, GENITAL
Chlamydia, DNA Probe: NEGATIVE
GC Probe Amp, Genital: NEGATIVE

## 2011-01-15 LAB — WET PREP, GENITAL
Clue Cells Wet Prep HPF POC: NONE SEEN
Yeast Wet Prep HPF POC: NONE SEEN

## 2011-01-15 LAB — POCT PREGNANCY, URINE: Preg Test, Ur: NEGATIVE

## 2011-01-16 LAB — URINALYSIS, ROUTINE W REFLEX MICROSCOPIC
Bilirubin Urine: NEGATIVE
Glucose, UA: NEGATIVE mg/dL
Hgb urine dipstick: NEGATIVE
Specific Gravity, Urine: 1.026 (ref 1.005–1.030)
Urobilinogen, UA: 1 mg/dL (ref 0.0–1.0)

## 2011-01-16 LAB — DIFFERENTIAL
Basophils Absolute: 0 10*3/uL (ref 0.0–0.1)
Basophils Relative: 0 % (ref 0–1)
Eosinophils Relative: 1 % (ref 0–5)
Monocytes Absolute: 0.5 10*3/uL (ref 0.1–1.0)
Monocytes Relative: 6 % (ref 3–12)
Neutro Abs: 6.5 10*3/uL (ref 1.7–7.7)

## 2011-01-16 LAB — COMPREHENSIVE METABOLIC PANEL
ALT: 21 U/L (ref 0–35)
AST: 25 U/L (ref 0–37)
Albumin: 3.9 g/dL (ref 3.5–5.2)
Alkaline Phosphatase: 68 U/L (ref 39–117)
BUN: 11 mg/dL (ref 6–23)
Chloride: 104 mEq/L (ref 96–112)
Creatinine, Ser: 0.54 mg/dL (ref 0.4–1.2)
GFR calc Af Amer: >60 mL/min (ref >60)
GFR calc non Af Amer: >60 mL/min (ref >60)
Potassium: 4 mEq/L (ref 3.5–5.1)
Sodium: 137 mEq/L (ref 135–145)
Total Bilirubin: 1.1 mg/dL (ref 0.3–1.2)

## 2011-01-16 LAB — CBC
HCT: 37.2 % (ref 36.0–46.0)
MCV: 77.6 fL — ABNORMAL LOW (ref 78.0–100.0)
Platelets: 276 10*3/uL (ref 150–400)
RBC: 4.8 MIL/uL (ref 3.87–5.11)
WBC: 8.2 10*3/uL (ref 4.0–10.5)

## 2011-01-16 LAB — URINE MICROSCOPIC-ADD ON

## 2011-01-16 LAB — LIPASE, BLOOD: Lipase: 20 U/L (ref 11–59)

## 2011-03-11 ENCOUNTER — Inpatient Hospital Stay (INDEPENDENT_AMBULATORY_CARE_PROVIDER_SITE_OTHER)
Admission: RE | Admit: 2011-03-11 | Discharge: 2011-03-11 | Disposition: A | Discharge status: Home / Self Care | Payer: Medicaid Other | Source: Ambulatory Visit | Attending: Family Medicine | Admitting: Family Medicine

## 2011-03-11 DIAGNOSIS — B373 Candidiasis of vulva and vagina: Secondary | ICD-10-CM

## 2011-03-11 DIAGNOSIS — H5710 Ocular pain, unspecified eye: Secondary | ICD-10-CM

## 2011-03-11 LAB — WET PREP, GENITAL: Trich, Wet Prep: NONE SEEN

## 2011-03-12 LAB — POCT URINALYSIS DIP (DEVICE)
Bilirubin Urine: NEGATIVE
Nitrite: NEGATIVE
Protein, ur: NEGATIVE mg/dL
Specific Gravity, Urine: 1.02 (ref 1.005–1.030)
Urobilinogen, UA: 1 mg/dL (ref 0.0–1.0)
pH: 7 (ref 5.0–8.0)

## 2011-03-13 LAB — GC/CHLAMYDIA PROBE AMP, GENITAL
Chlamydia, DNA Probe: NEGATIVE
GC Probe Amp, Genital: NEGATIVE

## 2011-04-04 ENCOUNTER — Emergency Department (HOSPITAL_COMMUNITY)
Admission: EM | Admit: 2011-04-04 | Discharge: 2011-04-04 | Disposition: A | Discharge status: Home / Self Care | Payer: Medicaid Other | Attending: Emergency Medicine | Admitting: Emergency Medicine

## 2011-04-04 DIAGNOSIS — R51 Headache: Secondary | ICD-10-CM | POA: Insufficient documentation

## 2011-04-04 DIAGNOSIS — R52 Pain, unspecified: Secondary | ICD-10-CM | POA: Insufficient documentation

## 2011-04-04 DIAGNOSIS — F341 Dysthymic disorder: Secondary | ICD-10-CM | POA: Insufficient documentation

## 2011-06-21 LAB — WET PREP, GENITAL: Clue Cells Wet Prep HPF POC: NONE SEEN

## 2011-06-21 LAB — GC/CHLAMYDIA PROBE AMP, GENITAL
Chlamydia, DNA Probe: NEGATIVE
GC Probe Amp, Genital: NEGATIVE

## 2011-06-21 LAB — URINALYSIS, ROUTINE W REFLEX MICROSCOPIC
Bilirubin Urine: NEGATIVE
Glucose, UA: NEGATIVE
Nitrite: NEGATIVE
Specific Gravity, Urine: 1.02
Urobilinogen, UA: 0.2

## 2011-06-21 LAB — POCT PREGNANCY, URINE: Operator id: 263291

## 2011-06-26 LAB — GC/CHLAMYDIA PROBE AMP, GENITAL
Chlamydia, DNA Probe: NEGATIVE
GC Probe Amp, Genital: NEGATIVE

## 2011-06-26 LAB — POCT PREGNANCY, URINE
Operator id: 239701
Preg Test, Ur: NEGATIVE

## 2011-06-26 LAB — WET PREP, GENITAL
Trich, Wet Prep: NONE SEEN
Yeast Wet Prep HPF POC: NONE SEEN

## 2011-06-28 LAB — URINALYSIS, ROUTINE W REFLEX MICROSCOPIC
Bilirubin Urine: NEGATIVE
Glucose, UA: NEGATIVE
pH: 6.5

## 2011-06-29 LAB — GLUCOSE, CAPILLARY: Glucose-Capillary: 104 — ABNORMAL HIGH

## 2011-07-02 LAB — URINALYSIS, ROUTINE W REFLEX MICROSCOPIC
Hgb urine dipstick: NEGATIVE
Ketones, ur: NEGATIVE
Nitrite: NEGATIVE
Specific Gravity, Urine: 1.025
Urobilinogen, UA: 0.2

## 2011-07-02 LAB — GC/CHLAMYDIA PROBE AMP, GENITAL
Chlamydia, DNA Probe: NEGATIVE
GC Probe Amp, Genital: NEGATIVE

## 2011-07-02 LAB — WET PREP, GENITAL

## 2011-07-06 LAB — URINALYSIS, ROUTINE W REFLEX MICROSCOPIC
Bilirubin Urine: NEGATIVE
Leukocytes, UA: NEGATIVE
Nitrite: NEGATIVE
Protein, ur: NEGATIVE mg/dL
Specific Gravity, Urine: 1.02 (ref 1.005–1.030)
pH: 7 (ref 5.0–8.0)

## 2011-07-06 LAB — URINE MICROSCOPIC-ADD ON

## 2011-07-06 LAB — POCT PREGNANCY, URINE: Preg Test, Ur: NEGATIVE

## 2011-07-12 LAB — URINALYSIS, ROUTINE W REFLEX MICROSCOPIC
Bilirubin Urine: NEGATIVE
Ketones, ur: 15 — AB
Nitrite: NEGATIVE
Protein, ur: 30 — AB
Specific Gravity, Urine: 1.02
Urobilinogen, UA: 0.2

## 2011-07-12 LAB — HERPES SIMPLEX VIRUS CULTURE

## 2011-07-12 LAB — GC/CHLAMYDIA PROBE AMP, GENITAL
Chlamydia, DNA Probe: NEGATIVE
GC Probe Amp, Genital: NEGATIVE

## 2011-07-12 LAB — WET PREP, GENITAL: Yeast Wet Prep HPF POC: NONE SEEN

## 2011-07-12 LAB — POCT PREGNANCY, URINE: Operator id: 251141

## 2011-07-12 LAB — URINE MICROSCOPIC-ADD ON

## 2011-07-13 LAB — WET PREP, GENITAL: Yeast Wet Prep HPF POC: NONE SEEN

## 2011-07-13 LAB — GC/CHLAMYDIA PROBE AMP, GENITAL: Chlamydia, DNA Probe: NEGATIVE

## 2011-07-16 LAB — GC/CHLAMYDIA PROBE AMP, GENITAL
Chlamydia, DNA Probe: NEGATIVE
GC Probe Amp, Genital: NEGATIVE

## 2011-07-16 LAB — WET PREP, GENITAL
Trich, Wet Prep: NONE SEEN
Yeast Wet Prep HPF POC: NONE SEEN

## 2011-07-16 LAB — CBC
HCT: 36.9
MCHC: 32.2
MCV: 77.3 — ABNORMAL LOW
Platelets: 331
RDW: 17 — ABNORMAL HIGH
WBC: 7.6

## 2011-07-16 LAB — POCT PREGNANCY, URINE
Operator id: 220991
Preg Test, Ur: POSITIVE

## 2011-07-16 LAB — URINALYSIS, ROUTINE W REFLEX MICROSCOPIC
Glucose, UA: NEGATIVE
Hgb urine dipstick: NEGATIVE
Ketones, ur: NEGATIVE
Protein, ur: NEGATIVE

## 2011-09-30 ENCOUNTER — Other Ambulatory Visit: Payer: Self-pay | Admitting: Obstetrics

## 2011-10-09 ENCOUNTER — Encounter: Payer: Self-pay | Admitting: Cardiology

## 2011-10-09 ENCOUNTER — Emergency Department (INDEPENDENT_AMBULATORY_CARE_PROVIDER_SITE_OTHER)
Admission: EM | Admit: 2011-10-09 | Discharge: 2011-10-09 | Disposition: A | Discharge status: Home / Self Care | Payer: Medicaid Other | Source: Home / Self Care

## 2011-10-09 DIAGNOSIS — B356 Tinea cruris: Secondary | ICD-10-CM

## 2011-10-09 HISTORY — DX: Herpesviral infection of urogenital system, unspecified: A60.00

## 2011-10-09 MED ORDER — NYSTATIN-TRIAMCINOLONE 100000-0.1 UNIT/GM-% EX CREA: TOPICAL_CREAM | CUTANEOUS | Status: DC

## 2011-10-09 NOTE — ED Notes (Signed)
Pt reports red raised "rash" on grion bilat that has been there for the past 5 days. She has a history of HSV and is taking Valtrex as prescribed [facility].  Also reports rash to right forearm. Area to right forearm noted to be scabbed. Area to right forearm is itching.

## 2011-10-09 NOTE — ED Provider Notes (Signed)
Medical screening examination/treatment/procedure(s) were performed by non-physician practitioner and as supervising physician I was immediately available for consultation/collaboration.  Melinda Jimenez,Melinda Jimenez   Melinda Jimenez Laney, MD 10/09/11 2212 

## 2011-10-09 NOTE — ED Provider Notes (Signed)
History     CSN: [id number]  Arrival date & time 10/09/11  1433   None     Chief Complaint  Patient presents with  . Rash    (Consider location/radiation/quality/duration/timing/severity/associated sxs/prior treatment) HPI Comments: Pt presents with an itchy rash to her groin x 5 days. She recently was dx with HSV and is concerned that this is an HSV outbreak. She is taking Valtrex without improvement. She recently had a rash on her forearms also but looked different than her groin and is now scabbed and going away.   Past Medical History  Diagnosis Date  . Genital HSV     Past Surgical History  Procedure Date  . Induced abortion 2009  . Vaginal delivery 2007    No family history on file.  History  Substance Use Topics  . Smoking status: Never Smoker   . Smokeless tobacco: Not on file  . Alcohol Use: Yes     occas    OB History    Grav Para Term Preterm Abortions TAB SAB Ect Mult Living                  Review of Systems  Constitutional: Negative for fever and chills.  Skin: Positive for rash.    Allergies  Review of patient's allergies indicates no known allergies.  Home Medications   Current Outpatient Rx  Name Route Sig Dispense Refill  . VALTREX PO Oral Take by mouth daily. Pt is taking 1/2 tablet daily unsure of dose.     . NYSTATIN-TRIAMCINOLONE 100000-0.1 UNIT/GM-% EX CREA  Apply to affected area twice daily until rash is gone 15 g 0    BP 131/78  Pulse 90  Temp(Src) 98.5 F (36.9 C) (Oral)  Resp 18  SpO2 100%  LMP 10/02/2011  Physical Exam  Nursing note and vitals reviewed. Constitutional: She appears well-developed and well-nourished. No distress.  HENT:  Head: Normocephalic and atraumatic.  Neurological: She is alert. Gait normal.  Skin: Skin is warm and dry.     Psychiatric: She has a normal mood and affect.    ED Course  Procedures (including critical care time)  Labs Reviewed - No data to display No results  found.   1. Tinea cruris       MDM  Scabbed lesions bilat ventral forearms . Lesions are resolving and difficult to assess source of lesion. Tinea corporis of groin.  Reassurance to pt that this is not HSV outbreak.         Melody Comas, [place] 10/09/11 1720

## 2011-10-19 ENCOUNTER — Emergency Department (INDEPENDENT_AMBULATORY_CARE_PROVIDER_SITE_OTHER)
Admission: EM | Admit: 2011-10-19 | Discharge: 2011-10-19 | Disposition: A | Discharge status: Home / Self Care | Payer: Medicaid Other | Source: Home / Self Care | Attending: Family Medicine | Admitting: Family Medicine

## 2011-10-19 ENCOUNTER — Encounter (HOSPITAL_COMMUNITY): Payer: Self-pay

## 2011-10-19 DIAGNOSIS — R109 Unspecified abdominal pain: Secondary | ICD-10-CM

## 2011-10-19 LAB — WET PREP, GENITAL
Clue Cells Wet Prep HPF POC: NONE SEEN
Yeast Wet Prep HPF POC: NONE SEEN

## 2011-10-19 LAB — POCT URINALYSIS DIP (DEVICE)
Ketones, ur: NEGATIVE mg/dL
Leukocytes, UA: NEGATIVE
Protein, ur: NEGATIVE mg/dL
Urobilinogen, UA: 0.2 mg/dL (ref 0.0–1.0)
pH: 5.5 (ref 5.0–8.0)

## 2011-10-19 LAB — POCT PREGNANCY, URINE: Preg Test, Ur: NEGATIVE

## 2011-10-19 MED ORDER — AZITHROMYCIN 250 MG PO TABS
ORAL_TABLET | ORAL | Status: AC
  Filled 2011-10-19: qty 4

## 2011-10-19 MED ORDER — CEFTRIAXONE SODIUM 250 MG IJ SOLR
INTRAMUSCULAR | Status: AC
  Filled 2011-10-19: qty 250

## 2011-10-19 MED ORDER — CEFTRIAXONE SODIUM 250 MG IJ SOLR
250.0000 mg | Freq: Once | INTRAMUSCULAR | Status: AC
  Administered 2011-10-19: 250 mg via INTRAMUSCULAR

## 2011-10-19 MED ORDER — LIDOCAINE HCL (PF) 1 % IJ SOLN
INTRAMUSCULAR | Status: AC
  Filled 2011-10-19: qty 5

## 2011-10-19 MED ORDER — AZITHROMYCIN 250 MG PO TABS
1000.0000 mg | ORAL_TABLET | Freq: Once | ORAL | Status: AC
  Administered 2011-10-19: 1000 mg via ORAL

## 2011-10-19 MED ORDER — NAPROXEN 500 MG PO TABS: 500.0000 mg | ORAL_TABLET | Freq: Two times a day (BID) | ORAL | Status: DC

## 2011-10-19 NOTE — ED Notes (Signed)
No apparent allergic concern observable at d/c

## 2011-10-19 NOTE — ED Provider Notes (Signed)
History     CSN: [id number]  Arrival date & time 10/19/11  1527   First MD Initiated Contact with Patient 10/19/11 1545      Chief Complaint  Patient presents with  . Abdominal Pain    (Consider location/radiation/quality/duration/timing/severity/associated sxs/prior treatment) HPI Comments: Melinda Jimenez presents for evaluation of lower abdominal pain that has been worsening over the last 6 days. She reports that it is intermittent in nature, is across her lower abdomen bilaterally. She denies any fever, nausea, vomiting, changes in bowel habits (had a normal one this morning); no urinary symptoms. She does report that her partner recently cheated on her. She denies any vaginal discharge. Her LMP was beginning of January. Upon further interview, she reports that she has experienced some abdominal pain over the last few months, discussed it with her PCP but did not receive further evaluation or treatment. She also reports daily nausea upon waking.   Patient is a 26 y.o. female presenting with abdominal pain. The history is provided by the patient.  Abdominal Pain The primary symptoms of the illness include abdominal pain. The primary symptoms of the illness do not include fever, fatigue, nausea, vomiting, diarrhea, dysuria, vaginal discharge or vaginal bleeding. The current episode started more than 2 days ago. The onset of the illness was sudden.  The abdominal pain began more than 2 days ago. The pain came on gradually. The abdominal pain has been gradually worsening since its onset. The abdominal pain is located in the LLQ and RLQ. The abdominal pain radiates to the back. The abdominal pain is relieved by nothing.  The patient states that she believes she is currently not pregnant. The patient has not had a change in bowel habit. Symptoms associated with the illness do not include chills, constipation, urgency or frequency.    Past Medical History  Diagnosis Date  . Genital HSV     Past  Surgical History  Procedure Date  . Induced abortion 2009  . Vaginal delivery 2007    History reviewed. No pertinent family history.  History  Substance Use Topics  . Smoking status: Never Smoker   . Smokeless tobacco: Not on file  . Alcohol Use: Yes     occas    OB History    Grav Para Term Preterm Abortions TAB SAB Ect Mult Living                  Review of Systems  Constitutional: Negative.  Negative for fever, chills and fatigue.  HENT: Negative.   Eyes: Negative.   Respiratory: Negative.   Cardiovascular: Negative.   Gastrointestinal: Positive for abdominal pain. Negative for nausea, vomiting, diarrhea and constipation.  Genitourinary: Negative.  Negative for dysuria, urgency, frequency, vaginal bleeding and vaginal discharge.  Musculoskeletal: Negative.   Skin: Negative.   Neurological: Negative.     Allergies  Review of patient's allergies indicates no known allergies.  Home Medications   Current Outpatient Rx  Name Route Sig Dispense Refill  . NYSTATIN-TRIAMCINOLONE 100000-0.1 UNIT/GM-% EX CREA  Apply to affected area twice daily until rash is gone 15 g 0  . VALTREX PO Oral Take by mouth daily. Pt is taking 1/2 tablet daily unsure of dose.       BP 103/71  Pulse 94  Temp(Src) 97.7 F (36.5 C) (Oral)  Resp 16  SpO2 100%  LMP 10/02/2011  Physical Exam  Nursing note and vitals reviewed. Constitutional: She is oriented to person, place, and time. She appears well-developed and well-nourished.  HENT:  Head: Normocephalic and atraumatic.  Eyes: EOM are normal.  Neck: Normal range of motion.  Pulmonary/Chest: Effort normal.  Genitourinary: Vagina normal and uterus normal. Pelvic exam was performed with patient supine. Cervix exhibits no motion tenderness, no discharge and no friability. Right adnexum displays no mass, no tenderness and no fullness. Left adnexum displays tenderness. Left adnexum displays no mass and no fullness.  Musculoskeletal: Normal  range of motion.  Neurological: She is alert and oriented to person, place, and time.  Skin: Skin is warm and dry.  Psychiatric: Her behavior is normal.    ED Course  Procedures (including critical care time)  Labs Reviewed  POCT URINALYSIS DIP (DEVICE) - Abnormal; Notable for the following:    Hgb urine dipstick TRACE (*)    All other components within normal limits  POCT PREGNANCY, URINE  POCT URINALYSIS DIPSTICK  POCT PREGNANCY, URINE   No results found.   No diagnosis found.    MDM  Labs reviewed; treated with ceftriaxone and azithromycin; naproxen; return in 48 hours if symptoms not improving.         Richardo Priest, MD 10/19/11 [phone]

## 2011-10-19 NOTE — ED Notes (Signed)
C/o pain in low abdominal area and concern for poss STD exposure, as she had unprotected sex w new partner

## 2011-10-20 ENCOUNTER — Emergency Department (HOSPITAL_COMMUNITY): Payer: Medicaid Other

## 2011-10-20 ENCOUNTER — Other Ambulatory Visit: Payer: Self-pay

## 2011-10-20 ENCOUNTER — Encounter (HOSPITAL_COMMUNITY): Payer: Self-pay | Admitting: Emergency Medicine

## 2011-10-20 ENCOUNTER — Emergency Department (HOSPITAL_COMMUNITY)
Admission: EM | Admit: 2011-10-20 | Discharge: 2011-10-20 | Disposition: A | Discharge status: Home / Self Care | Payer: Medicaid Other | Attending: Emergency Medicine | Admitting: Emergency Medicine

## 2011-10-20 DIAGNOSIS — R011 Cardiac murmur, unspecified: Secondary | ICD-10-CM | POA: Insufficient documentation

## 2011-10-20 DIAGNOSIS — M79609 Pain in unspecified limb: Secondary | ICD-10-CM | POA: Insufficient documentation

## 2011-10-20 DIAGNOSIS — M542 Cervicalgia: Secondary | ICD-10-CM | POA: Insufficient documentation

## 2011-10-20 DIAGNOSIS — S20219A Contusion of unspecified front wall of thorax, initial encounter: Secondary | ICD-10-CM

## 2011-10-20 DIAGNOSIS — R079 Chest pain, unspecified: Secondary | ICD-10-CM | POA: Insufficient documentation

## 2011-10-20 DIAGNOSIS — R109 Unspecified abdominal pain: Secondary | ICD-10-CM | POA: Insufficient documentation

## 2011-10-20 DIAGNOSIS — IMO0002 Reserved for concepts with insufficient information to code with codable children: Secondary | ICD-10-CM | POA: Insufficient documentation

## 2011-10-20 LAB — URINALYSIS, MICROSCOPIC ONLY
Bilirubin Urine: NEGATIVE
Glucose, UA: NEGATIVE mg/dL
Hgb urine dipstick: NEGATIVE
Ketones, ur: NEGATIVE mg/dL
Leukocytes, UA: NEGATIVE
Nitrite: NEGATIVE
Protein, ur: NEGATIVE mg/dL
Urobilinogen, UA: 0.2 mg/dL (ref 0.0–1.0)

## 2011-10-20 MED ORDER — HYDROCODONE-ACETAMINOPHEN 5-325 MG PO TABS: 2.0000 | ORAL_TABLET | ORAL | Status: AC | PRN

## 2011-10-20 MED ORDER — ONDANSETRON HCL 4 MG/2ML IJ SOLN
4.0000 mg | Freq: Once | INTRAMUSCULAR | Status: AC
  Administered 2011-10-20: 4 mg via INTRAVENOUS
  Filled 2011-10-20: qty 2

## 2011-10-20 MED ORDER — OXYCODONE-ACETAMINOPHEN 5-325 MG PO TABS
1.0000 | ORAL_TABLET | Freq: Once | ORAL | Status: AC
  Administered 2011-10-20: 1 via ORAL

## 2011-10-20 MED ORDER — IBUPROFEN 800 MG PO TABS: 800.0000 mg | ORAL_TABLET | Freq: Three times a day (TID) | ORAL | Status: AC

## 2011-10-20 MED ORDER — IOHEXOL 300 MG/ML  SOLN
100.0000 mL | Freq: Once | INTRAMUSCULAR | Status: AC | PRN
  Administered 2011-10-20: 100 mL via INTRAVENOUS

## 2011-10-20 MED ORDER — FENTANYL CITRATE 0.05 MG/ML IJ SOLN
50.0000 ug | Freq: Once | INTRAMUSCULAR | Status: AC
  Administered 2011-10-20: 50 ug via INTRAVENOUS
  Filled 2011-10-20: qty 2

## 2011-10-20 MED ORDER — OXYCODONE-ACETAMINOPHEN 5-325 MG PO TABS
ORAL_TABLET | ORAL | Status: AC
  Administered 2011-10-20: 1 via ORAL
  Filled 2011-10-20: qty 1

## 2011-10-20 NOTE — ED Notes (Signed)
Pt states pain down to 5/10 from 10/10. Waiting for scans. No distress noted. Pt talking on phone with family

## 2011-10-20 NOTE — ED Notes (Signed)
Pt ambulated and tolerated well. Tolerating po well.
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
## 2011-11-17 ENCOUNTER — Encounter (HOSPITAL_COMMUNITY): Payer: Self-pay | Admitting: Emergency Medicine

## 2011-11-17 ENCOUNTER — Emergency Department (HOSPITAL_COMMUNITY)
Admission: EM | Admit: 2011-11-17 | Discharge: 2011-11-18 | Disposition: A | Discharge status: Home / Self Care | Payer: Medicaid Other | Attending: Emergency Medicine | Admitting: Emergency Medicine

## 2011-11-17 DIAGNOSIS — A499 Bacterial infection, unspecified: Secondary | ICD-10-CM | POA: Insufficient documentation

## 2011-11-17 DIAGNOSIS — R21 Rash and other nonspecific skin eruption: Secondary | ICD-10-CM | POA: Insufficient documentation

## 2011-11-17 DIAGNOSIS — L298 Other pruritus: Secondary | ICD-10-CM | POA: Insufficient documentation

## 2011-11-17 DIAGNOSIS — B9689 Other specified bacterial agents as the cause of diseases classified elsewhere: Secondary | ICD-10-CM | POA: Insufficient documentation

## 2011-11-17 DIAGNOSIS — N760 Acute vaginitis: Secondary | ICD-10-CM

## 2011-11-17 DIAGNOSIS — L2989 Other pruritus: Secondary | ICD-10-CM | POA: Insufficient documentation

## 2011-11-17 DIAGNOSIS — F419 Anxiety disorder, unspecified: Secondary | ICD-10-CM | POA: Insufficient documentation

## 2011-11-17 HISTORY — DX: Anxiety disorder, unspecified: F41.9

## 2011-11-17 HISTORY — DX: Disorder of thyroid, unspecified: E07.9

## 2011-11-17 LAB — WET PREP, GENITAL
Trich, Wet Prep: NONE SEEN
Yeast Wet Prep HPF POC: NONE SEEN

## 2011-11-17 MED ORDER — METRONIDAZOLE 500 MG PO TABS
500.0000 mg | ORAL_TABLET | Freq: Once | ORAL | Status: AC
  Administered 2011-11-17: 500 mg via ORAL
  Filled 2011-11-17: qty 1

## 2011-11-17 MED ORDER — FLUCONAZOLE 50 MG PO TABS
150.0000 mg | ORAL_TABLET | ORAL | Status: AC
  Administered 2011-11-18: 50 mg via ORAL
  Filled 2011-11-17: qty 3

## 2011-11-17 MED ORDER — FAMOTIDINE 20 MG PO TABS: 20.0000 mg | ORAL_TABLET | Freq: Two times a day (BID) | ORAL | Status: DC

## 2011-11-17 MED ORDER — METRONIDAZOLE 500 MG PO TABS: 500.0000 mg | ORAL_TABLET | Freq: Two times a day (BID) | ORAL | Status: AC

## 2011-11-17 NOTE — ED Notes (Signed)
Patient complaining of rash on forearms, legs, and top of feet for the past three weeks; reports itchiness.  Patient also requesting pregnancy test and treatment for possible GC/Chlamydia exposure.

## 2011-11-17 NOTE — Discharge Instructions (Signed)
Bacterial Vaginosis Bacterial vaginosis (BV) is a vaginal infection where the normal balance of bacteria in the vagina is disrupted. The normal balance is then replaced by an overgrowth of certain bacteria. There are several different kinds of bacteria that can cause BV. BV is the most common vaginal infection in women of childbearing age. CAUSES   The cause of BV is not fully understood. BV develops when there is an increase or imbalance of harmful bacteria.   Some activities or behaviors can upset the normal balance of bacteria in the vagina and put women at increased risk including:   Having a new sex partner or multiple sex partners.   Douching.   Using an intrauterine device (IUD) for contraception.   It is not clear what role sexual activity plays in the development of BV. However, women that have never had sexual intercourse are rarely infected with BV.  Women do not get BV from toilet seats, bedding, swimming pools or from touching objects around them.  SYMPTOMS   Grey vaginal discharge.   A fish-like odor with discharge, especially after sexual intercourse.   Itching or burning of the vagina and vulva.   Burning or pain with urination.   Some women have no signs or symptoms at all.  DIAGNOSIS  Your caregiver must examine the vagina for signs of BV. Your caregiver will perform lab tests and look at the sample of vaginal fluid through a microscope. They will look for bacteria and abnormal cells (clue cells), a pH test higher than 4.5, and a positive amine test all associated with BV.  RISKS AND COMPLICATIONS   Pelvic inflammatory disease (PID).   Infections following gynecology surgery.   Developing HIV.   Developing herpes virus.  TREATMENT  Sometimes BV will clear up without treatment. However, all women with symptoms of BV should be treated to avoid complications, especially if gynecology surgery is planned. Female partners generally do not need to be treated. However,  BV may spread between female sex partners so treatment is helpful in preventing a recurrence of BV.   BV may be treated with antibiotics. The antibiotics come in either pill or vaginal cream forms. Either can be used with nonpregnant or pregnant women, but the recommended dosages differ. These antibiotics are not harmful to the baby.   BV can recur after treatment. If this happens, a second round of antibiotics will often be prescribed.   Treatment is important for pregnant women. If not treated, BV can cause a premature delivery, especially for a pregnant woman who had a premature birth in the past. All pregnant women who have symptoms of BV should be checked and treated.   For chronic reoccurrence of BV, treatment with a type of prescribed gel vaginally twice a week is helpful.  HOME CARE INSTRUCTIONS   Finish all medication as directed by your caregiver.   Do not have sex until treatment is completed.   Tell your sexual partner that you have a vaginal infection. They should see their caregiver and be treated if they have problems, such as a mild rash or itching.   Practice safe sex. Use condoms. Only have 1 sex partner.  PREVENTION  Basic prevention steps can help reduce the risk of upsetting the natural balance of bacteria in the vagina and developing BV:  Do not have sexual intercourse (be abstinent).   Do not douche.   Use all of the medicine prescribed for treatment of BV, even if the signs and symptoms go away.     Tell your sex partner if you have BV. That way, they can be treated, if needed, to prevent reoccurrence.  SEEK MEDICAL CARE IF:   Your symptoms are not improving after 3 days of treatment.   You have increased discharge, pain, or fever.  MAKE SURE YOU:   Understand these instructions.   Will watch your condition.   Will get help right away if you are not doing well or get worse.  FOR MORE INFORMATION  Division of STD Prevention (DSTDP), Centers for Disease  Control and Prevention: [employer] American Social Health Association (ASHA): www.ashastd.org  Document Released: 09/17/2005 Document Revised: 05/30/2011 Document Reviewed: 03/10/2009 [facility] Patient Information 2012 [place], [place]. Cultures still pending if it is positive he will be notified to return for further treatment

## 2011-11-17 NOTE — ED Provider Notes (Signed)
Medical screening examination/treatment/procedure(s) were performed by non-physician practitioner and as supervising physician I was immediately available for consultation/collaboration.   Tonye Tancredi L Xaiver Roskelley, MD 11/17/11 2349 

## 2011-11-17 NOTE — ED Provider Notes (Cosign Needed Addendum)
History     CSN: [id number]  Arrival date & time 11/17/11  1939   First MD Initiated Contact with Patient 11/17/11 2107      Chief Complaint  Patient presents with  . Rash    (Consider location/radiation/quality/duration/timing/severity/associated sxs/prior treatment) HPI Comments: Patient initially started 3 days ago with a rash in her groin that was diagnosed as a fungus.  She has been using antifungal cream that rash has resolved.  Now.  She has a pruritic rash on bilateral forearms, that she be using the antifungal cream and hydrocortisone over-the-counter cream without  Resolution.  Her menstrual cycle is also late and she think she may have an STD  Patient is a 26 y.o. female presenting with rash.  Rash  This is a new problem. The current episode started more than 1 week ago. The problem has not changed since onset.The problem is associated with nothing. There has been no fever. The rash is present on the left arm and right arm. The pain is at a severity of 0/10. The patient is experiencing no pain. Associated symptoms include itching. She has tried steriods for the symptoms.    Past Medical History  Diagnosis Date  . Genital HSV   . Anxiety   . Thyroid disease     Past Surgical History  Procedure Date  . Induced abortion 2009  . Vaginal delivery 2007    History reviewed. No pertinent family history.  History  Substance Use Topics  . Smoking status: Never Smoker   . Smokeless tobacco: Not on file  . Alcohol Use: Yes     occas    OB History    Grav Para Term Preterm Abortions TAB SAB Ect Mult Living                  Review of Systems  Constitutional: Negative for fever and chills.  Genitourinary: Positive for vaginal discharge.  Skin: Positive for itching and rash.  Neurological: Negative for dizziness and weakness.    Allergies  Review of patient's allergies indicates no known allergies.  Home Medications   Current Outpatient Rx  Name Route Sig  Dispense Refill  . ALPRAZOLAM 0.5 MG PO TABS Oral Take 0.5 mg by mouth daily as needed. For anxiety    . VALACYCLOVIR HCL 1 G PO TABS Oral Take 500 mg by mouth daily as needed. For outbreaks    . FAMOTIDINE 20 MG PO TABS Oral Take 1 tablet (20 mg total) by mouth 2 (two) times daily. 30 tablet 0  . METRONIDAZOLE 500 MG PO TABS Oral Take 1 tablet (500 mg total) by mouth 2 (two) times daily. 14 tablet 0    BP 99/84  Pulse 93  Temp(Src) 97.8 F (36.6 C) (Oral)  Resp 20  SpO2 99%  LMP 09/29/2011  Physical Exam  Constitutional: She appears well-developed and well-nourished.  HENT:  Head: Normocephalic.  Eyes: Pupils are equal, round, and reactive to light.  Neck: Normal range of motion.  Cardiovascular: Normal rate.   Pulmonary/Chest: Effort normal.  Abdominal: Soft.  Genitourinary: Uterus normal. Vaginal discharge found.  Musculoskeletal: Normal range of motion.  Neurological: She is alert.  Skin: Skin is warm.  Psychiatric: She has a normal mood and affect.    ED Course  Procedures (including critical care time)  Labs Reviewed  WET PREP, GENITAL - Abnormal; Notable for the following:    Clue Cells Wet Prep HPF POC MODERATE (*)    WBC, Wet Prep HPF POC  FEW (*)    All other components within normal limits  POCT PREGNANCY, URINE  GC/CHLAMYDIA PROBE AMP, GENITAL   No results found.   1. Bacterial vaginosis   2. Rash and nonspecific skin eruption     Wet prep reveals positive clue cells.  Will treat with by mouth Flagyl  MDM  We'll perform pelvic exam and obtained urine pregnancy test  at the time of discharge.  Patient informs the nurse that she, knows she's had intercourse with a person, who has tested positive for gonorrhea.  This is the first event occurred of this.  I will add Rocephin and azithromycin to her medication in the emergency department.  Her vagina has been swabbed for these diseases       Arman Filter, NP 11/17/11 2320  Arman Filter,  NP 11/17/11 9604  Arman Filter, NP 11/18/11 0000

## 2011-11-18 MED ORDER — CEFTRIAXONE SODIUM 250 MG IJ SOLR
250.0000 mg | Freq: Once | INTRAMUSCULAR | Status: AC
  Administered 2011-11-18: 250 mg via INTRAMUSCULAR
  Filled 2011-11-18: qty 250

## 2011-11-18 MED ORDER — AZITHROMYCIN 250 MG PO TABS
1000.0000 mg | ORAL_TABLET | Freq: Once | ORAL | Status: AC
  Administered 2011-11-18: 1000 mg via ORAL
  Filled 2011-11-18: qty 4

## 2011-11-19 LAB — GC/CHLAMYDIA PROBE AMP, GENITAL: GC Probe Amp, Genital: NEGATIVE

## 2011-12-15 ENCOUNTER — Emergency Department (INDEPENDENT_AMBULATORY_CARE_PROVIDER_SITE_OTHER)
Admission: EM | Admit: 2011-12-15 | Discharge: 2011-12-15 | Disposition: A | Discharge status: Home / Self Care | Payer: Medicaid Other | Source: Home / Self Care | Attending: Family Medicine | Admitting: Family Medicine

## 2011-12-15 ENCOUNTER — Encounter (HOSPITAL_COMMUNITY): Payer: Self-pay | Admitting: Emergency Medicine

## 2011-12-15 DIAGNOSIS — J020 Streptococcal pharyngitis: Secondary | ICD-10-CM

## 2011-12-15 HISTORY — DX: Emotional lability: R45.86

## 2011-12-15 HISTORY — DX: Panic disorder (episodic paroxysmal anxiety): F41.0

## 2011-12-15 MED ORDER — IBUPROFEN 800 MG PO TABS
ORAL_TABLET | ORAL | Status: AC
  Filled 2011-12-15: qty 1

## 2011-12-15 MED ORDER — IBUPROFEN 800 MG PO TABS
800.0000 mg | ORAL_TABLET | Freq: Once | ORAL | Status: AC
  Administered 2011-12-15: 800 mg via ORAL

## 2011-12-15 MED ORDER — AMOXICILLIN-POT CLAVULANATE 875-125 MG PO TABS: 1.0000 | ORAL_TABLET | Freq: Two times a day (BID) | ORAL | Status: AC

## 2011-12-15 NOTE — Discharge Instructions (Signed)
Drink lots of fluids, take all of medicine, use lozenges as needed.return if needed °

## 2011-12-15 NOTE — ED Provider Notes (Signed)
History     CSN: [id number]  Arrival date & time 12/15/11  1113   First MD Initiated Contact with Patient 12/15/11 1132      Chief Complaint  Patient presents with  . Sore Throat    (Consider location/radiation/quality/duration/timing/severity/associated sxs/prior treatment) Patient is a 26 y.o. female presenting with pharyngitis. The history is provided by the patient.  Sore Throat This is a new problem. The current episode started more than 2 days ago. The problem occurs constantly. The problem has been gradually worsening. Associated symptoms comments: Neck pain this am,. The symptoms are aggravated by swallowing.    Past Medical History  Diagnosis Date  . Genital HSV   . Anxiety   . Thyroid disease     Past Surgical History  Procedure Date  . Induced abortion 2009  . Vaginal delivery 2007    No family history on file.  History  Substance Use Topics  . Smoking status: Never Smoker   . Smokeless tobacco: Not on file  . Alcohol Use: Yes     occas    OB History    Grav Para Term Preterm Abortions TAB SAB Ect Mult Living                  Review of Systems  Constitutional: Positive for chills and appetite change. Negative for fever.  HENT: Positive for ear pain, sore throat and neck pain.   Respiratory: Negative.   Cardiovascular: Negative.   Gastrointestinal: Negative.     Allergies  Review of patient's allergies indicates no known allergies.  Home Medications   Current Outpatient Rx  Name Route Sig Dispense Refill  . ALPRAZOLAM 0.5 MG PO TABS Oral Take 0.5 mg by mouth daily as needed. For anxiety    . AMOXICILLIN-POT CLAVULANATE 875-125 MG PO TABS Oral Take 1 tablet by mouth 2 (two) times daily. 20 tablet 0  . FAMOTIDINE 20 MG PO TABS Oral Take 1 tablet (20 mg total) by mouth 2 (two) times daily. 30 tablet 0  . VALACYCLOVIR HCL 1 G PO TABS Oral Take 500 mg by mouth daily as needed. For outbreaks      BP 124/88  Pulse 90  Temp(Src) 98.7 F (37.1  C) (Oral)  Resp 18  SpO2 100%  LMP 10/05/2010  Physical Exam  Nursing note and vitals reviewed. Constitutional: She is oriented to person, place, and time. She appears well-developed and well-nourished. She appears distressed.  HENT:  Head: Normocephalic.  Right Ear: External ear normal.  Left Ear: External ear normal.  Nose: Nose normal.  Mouth/Throat: Oropharyngeal exudate present.  Eyes: Pupils are equal, round, and reactive to light.  Neck: Normal range of motion. Neck supple.  Cardiovascular: Normal rate, normal heart sounds and intact distal pulses.   Pulmonary/Chest: Breath sounds normal.  Lymphadenopathy:    She has cervical adenopathy.  Neurological: She is alert and oriented to person, place, and time.  Skin: Skin is warm and dry.    ED Course  Procedures (including critical care time)  Labs Reviewed - No data to display No results found.   1. Strep throat       MDM          Linna Hoff, MD 12/15/11 1247

## 2011-12-15 NOTE — ED Notes (Signed)
Sore throat for 4-5 days, swollen and swollen glands noticed this am.  Denies fever this past week

## 2011-12-15 NOTE — ED Notes (Signed)
Prior to discharge, patient asking for pain medicine.  Spoke to dr Artis Flock, suggested ibuprofen

## 2012-01-28 ENCOUNTER — Other Ambulatory Visit (HOSPITAL_COMMUNITY): Payer: Self-pay | Admitting: Family Medicine

## 2012-01-28 DIAGNOSIS — N898 Other specified noninflammatory disorders of vagina: Secondary | ICD-10-CM

## 2012-02-01 ENCOUNTER — Inpatient Hospital Stay (HOSPITAL_COMMUNITY): Admission: RE | Admit: 2012-02-01 | Payer: Medicaid Other | Source: Ambulatory Visit

## 2012-02-04 ENCOUNTER — Inpatient Hospital Stay (HOSPITAL_COMMUNITY)
Admission: EM | Admit: 2012-02-04 | Discharge: 2012-02-06 | DRG: 601 | Disposition: A | Discharge status: Home / Self Care | Payer: Medicaid Other | Source: Ambulatory Visit | Attending: Internal Medicine | Admitting: Internal Medicine

## 2012-02-04 ENCOUNTER — Encounter (HOSPITAL_COMMUNITY): Payer: Self-pay | Admitting: *Deleted

## 2012-02-04 ENCOUNTER — Emergency Department (HOSPITAL_COMMUNITY): Payer: Medicaid Other

## 2012-02-04 DIAGNOSIS — M722 Plantar fascial fibromatosis: Secondary | ICD-10-CM

## 2012-02-04 DIAGNOSIS — N760 Acute vaginitis: Secondary | ICD-10-CM | POA: Diagnosis present

## 2012-02-04 DIAGNOSIS — A6000 Herpesviral infection of urogenital system, unspecified: Secondary | ICD-10-CM

## 2012-02-04 DIAGNOSIS — G5600 Carpal tunnel syndrome, unspecified upper limb: Secondary | ICD-10-CM

## 2012-02-04 DIAGNOSIS — E559 Vitamin D deficiency, unspecified: Secondary | ICD-10-CM

## 2012-02-04 DIAGNOSIS — K029 Dental caries, unspecified: Secondary | ICD-10-CM

## 2012-02-04 DIAGNOSIS — H538 Other visual disturbances: Secondary | ICD-10-CM

## 2012-02-04 DIAGNOSIS — D649 Anemia, unspecified: Secondary | ICD-10-CM

## 2012-02-04 DIAGNOSIS — F3289 Other specified depressive episodes: Secondary | ICD-10-CM

## 2012-02-04 DIAGNOSIS — N61 Mastitis without abscess: Principal | ICD-10-CM

## 2012-02-04 DIAGNOSIS — R112 Nausea with vomiting, unspecified: Secondary | ICD-10-CM | POA: Diagnosis not present

## 2012-02-04 DIAGNOSIS — G894 Chronic pain syndrome: Secondary | ICD-10-CM

## 2012-02-04 DIAGNOSIS — F329 Major depressive disorder, single episode, unspecified: Secondary | ICD-10-CM

## 2012-02-04 DIAGNOSIS — F411 Generalized anxiety disorder: Secondary | ICD-10-CM

## 2012-02-04 DIAGNOSIS — R700 Elevated erythrocyte sedimentation rate: Secondary | ICD-10-CM

## 2012-02-04 DIAGNOSIS — K589 Irritable bowel syndrome without diarrhea: Secondary | ICD-10-CM

## 2012-02-04 DIAGNOSIS — N920 Excessive and frequent menstruation with regular cycle: Secondary | ICD-10-CM | POA: Diagnosis present

## 2012-02-04 DIAGNOSIS — K3189 Other diseases of stomach and duodenum: Secondary | ICD-10-CM

## 2012-02-04 DIAGNOSIS — L0390 Cellulitis, unspecified: Secondary | ICD-10-CM

## 2012-02-04 DIAGNOSIS — T373X5A Adverse effect of other antiprotozoal drugs, initial encounter: Secondary | ICD-10-CM | POA: Diagnosis not present

## 2012-02-04 DIAGNOSIS — B9689 Other specified bacterial agents as the cause of diseases classified elsewhere: Secondary | ICD-10-CM | POA: Diagnosis present

## 2012-02-04 DIAGNOSIS — R350 Frequency of micturition: Secondary | ICD-10-CM

## 2012-02-04 DIAGNOSIS — R51 Headache: Secondary | ICD-10-CM

## 2012-02-04 DIAGNOSIS — N939 Abnormal uterine and vaginal bleeding, unspecified: Secondary | ICD-10-CM

## 2012-02-04 DIAGNOSIS — E049 Nontoxic goiter, unspecified: Secondary | ICD-10-CM

## 2012-02-04 DIAGNOSIS — D72829 Elevated white blood cell count, unspecified: Secondary | ICD-10-CM | POA: Diagnosis present

## 2012-02-04 DIAGNOSIS — F419 Anxiety disorder, unspecified: Secondary | ICD-10-CM

## 2012-02-04 LAB — URINALYSIS, ROUTINE W REFLEX MICROSCOPIC
Bilirubin Urine: NEGATIVE
Glucose, UA: NEGATIVE mg/dL
Ketones, ur: 15 mg/dL — AB
Protein, ur: 30 mg/dL — AB
Urobilinogen, UA: 1 mg/dL (ref 0.0–1.0)

## 2012-02-04 LAB — URINE MICROSCOPIC-ADD ON

## 2012-02-04 LAB — BASIC METABOLIC PANEL
BUN: 10 mg/dL (ref 6–23)
CO2: 23 mEq/L (ref 19–32)
Calcium: 9.6 mg/dL (ref 8.4–10.5)
Creatinine, Ser: 0.69 mg/dL (ref 0.50–1.10)
GFR calc Af Amer: >90 mL/min (ref >90)
GFR calc non Af Amer: >90 mL/min (ref >90)
Glucose, Bld: 120 mg/dL — ABNORMAL HIGH (ref 70–99)
Potassium: 3.6 mEq/L (ref 3.5–5.1)
Sodium: 137 mEq/L (ref 135–145)

## 2012-02-04 LAB — WET PREP, GENITAL
Trich, Wet Prep: NONE SEEN
Yeast Wet Prep HPF POC: NONE SEEN

## 2012-02-04 LAB — CBC
HCT: 34.4 % — ABNORMAL LOW (ref 36.0–46.0)
MCHC: 33.1 g/dL (ref 30.0–36.0)
MCV: 73.5 fL — ABNORMAL LOW (ref 78.0–100.0)
Platelets: 323 10*3/uL (ref 150–400)
RDW: 16.2 % — ABNORMAL HIGH (ref 11.5–15.5)

## 2012-02-04 MED ORDER — SODIUM CHLORIDE 0.9 % IV BOLUS (SEPSIS)
1000.0000 mL | Freq: Once | INTRAVENOUS | Status: AC
  Administered 2012-02-04: 1000 mL via INTRAVENOUS

## 2012-02-04 MED ORDER — MORPHINE SULFATE 4 MG/ML IJ SOLN
4.0000 mg | Freq: Once | INTRAMUSCULAR | Status: AC
  Administered 2012-02-04: 4 mg via INTRAVENOUS
  Filled 2012-02-04: qty 1

## 2012-02-04 MED ORDER — HYDROMORPHONE HCL PF 1 MG/ML IJ SOLN
1.0000 mg | Freq: Once | INTRAMUSCULAR | Status: AC
  Administered 2012-02-04: 1 mg via INTRAVENOUS
  Filled 2012-02-04: qty 1

## 2012-02-04 MED ORDER — VANCOMYCIN HCL IN DEXTROSE 1-5 GM/200ML-% IV SOLN
1000.0000 mg | Freq: Three times a day (TID) | INTRAVENOUS | Status: DC
  Administered 2012-02-04 – 2012-02-06 (×5): 1000 mg via INTRAVENOUS
  Filled 2012-02-04 (×8): qty 200

## 2012-02-04 NOTE — ED Notes (Signed)
Pt presents with c/o lower abdominal cramping and vaginal bleeding x 1 week. Pt reports LMP was 11/30/11. Pt reports passing large clots and using 2 pads per hour. Denies n/v. Also c/o generalized headache since this morning. Denies visual disturbance. Pt resting. Denies further needs at this time.

## 2012-02-04 NOTE — Progress Notes (Addendum)
ANTIBIOTIC CONSULT NOTE - INITIAL  Pharmacy Consult for Vancomycin / Zosyn Indication:  Right breast cellulitis/abscess  No Known Allergies  Patient Measurements: Height: 5\' 7"  (170.2 cm) Weight: 230 lb (104.327 kg) (estimated by patient) IBW/kg (Calculated) : 61.6  Adjusted Body Weight: 104 kg  Vital Signs: Temp: 99.5 F (37.5 C) (05/06 2042) Temp src: Oral (05/06 2042) BP: 118/77 mmHg (05/06 2042) Pulse Rate: 104  (05/06 2042)   Labs:  Basename 02/04/12 1735  WBC 25.0*  HGB 11.4*  PLT 323  LABCREA --  CREATININE 0.69   Estimated Creatinine Clearance: 135.9 ml/min (by C-G formula based on Cr of 0.69).   Microbiology: Recent Results (from the past 720 hour(s))  WET PREP, GENITAL     Status: Abnormal   Collection Time   02/04/12  8:05 PM      Component Value Range Status Comment   Yeast Wet Prep HPF POC NONE SEEN  NONE SEEN  Final    Trich, Wet Prep NONE SEEN  NONE SEEN  Final    Clue Cells Wet Prep HPF POC FEW (*) NONE SEEN  Final    WBC, Wet Prep HPF POC FEW (*) NONE SEEN  Final     Medical History: Past Medical History  Diagnosis Date  . Genital HSV   . Anxiety   . Thyroid disease   . Panic attack   . Mood swings    Assessment:   In ED with lower abdominal cramping and vaginal bleeding x 1 week.  Red rash noted on right breast.   Temp 99.2, WBC up to 25.0.   Pharmacy to assist with Vancomycin dosing. Hospitalist to see.  Goal of Therapy:  Vancomycin trough level 10-15 mcg/ml  Plan:    Vancomycin 1 gram IV q8hrs.   Will follow renal function and clinical status.   Will consider checking trough level if  > 5 days treatment needed.  Dennie Fetters, [place] Pager: [phone] 02/04/2012,9:11 PM  Addendum: Pharmacy consulted to manage Zosyn. Will start Zosyn 3.375gm IV Q8H (4 hr infusion).   Lorre Munroe, PharmD 02/05/2012, 02:00 AM

## 2012-02-04 NOTE — ED Notes (Signed)
Pt has been having a headache all day since she woke up this am.  Pt also has had a red rash on her right breast since she woke up this am.  Pt has also been having a heavier than normal period for one week and has been passing some blood clots with this.  Pt has been having some dizziness with this headache.  No neuro deficits

## 2012-02-05 ENCOUNTER — Encounter (HOSPITAL_COMMUNITY): Payer: Self-pay | Admitting: Internal Medicine

## 2012-02-05 DIAGNOSIS — R112 Nausea with vomiting, unspecified: Secondary | ICD-10-CM

## 2012-02-05 DIAGNOSIS — D649 Anemia, unspecified: Secondary | ICD-10-CM | POA: Diagnosis present

## 2012-02-05 DIAGNOSIS — N61 Mastitis without abscess: Secondary | ICD-10-CM | POA: Diagnosis present

## 2012-02-05 DIAGNOSIS — L03319 Cellulitis of trunk, unspecified: Secondary | ICD-10-CM

## 2012-02-05 DIAGNOSIS — L02219 Cutaneous abscess of trunk, unspecified: Secondary | ICD-10-CM

## 2012-02-05 LAB — GC/CHLAMYDIA PROBE AMP, GENITAL
Chlamydia, DNA Probe: NEGATIVE
GC Probe Amp, Genital: NEGATIVE

## 2012-02-05 LAB — COMPREHENSIVE METABOLIC PANEL
ALT: 6 U/L (ref 0–35)
AST: 9 U/L (ref 0–37)
Alkaline Phosphatase: 59 U/L (ref 39–117)
CO2: 23 mEq/L (ref 19–32)
Calcium: 8.6 mg/dL (ref 8.4–10.5)
GFR calc Af Amer: >90 mL/min (ref >90)
GFR calc non Af Amer: >90 mL/min (ref >90)
Glucose, Bld: 114 mg/dL — ABNORMAL HIGH (ref 70–99)
Potassium: 3 mEq/L — ABNORMAL LOW (ref 3.5–5.1)
Sodium: 137 mEq/L (ref 135–145)
Total Protein: 6.8 g/dL (ref 6.0–8.3)

## 2012-02-05 LAB — FERRITIN: Ferritin: 77 ng/mL (ref 10–291)

## 2012-02-05 LAB — RETICULOCYTES
RBC.: 4.47 MIL/uL (ref 3.87–5.11)
Retic Count, Absolute: 53.6 10*3/uL (ref 19.0–186.0)
Retic Ct Pct: 1.2 % (ref 0.4–3.1)

## 2012-02-05 LAB — DIFFERENTIAL
Basophils Absolute: 0 10*3/uL (ref 0.0–0.1)
Basophils Relative: 0 % (ref 0–1)
Eosinophils Absolute: 0 10*3/uL (ref 0.0–0.7)
Eosinophils Relative: 0 % (ref 0–5)
Lymphs Abs: 2.4 10*3/uL (ref 0.7–4.0)
Monocytes Absolute: 0.8 10*3/uL (ref 0.1–1.0)
Monocytes Relative: 6 % (ref 3–12)
Neutro Abs: 11.1 10*3/uL — ABNORMAL HIGH (ref 1.7–7.7)
Neutrophils Relative %: 77 % (ref 43–77)

## 2012-02-05 LAB — CREATININE, SERUM
Creatinine, Ser: 0.65 mg/dL (ref 0.50–1.10)
GFR calc Af Amer: >90 mL/min (ref >90)
GFR calc non Af Amer: >90 mL/min (ref >90)

## 2012-02-05 LAB — CBC
HCT: 29.7 % — ABNORMAL LOW (ref 36.0–46.0)
Hemoglobin: 10.9 g/dL — ABNORMAL LOW (ref 12.0–15.0)
Hemoglobin: 9.5 g/dL — ABNORMAL LOW (ref 12.0–15.0)
MCH: 23.8 pg — ABNORMAL LOW (ref 26.0–34.0)
MCHC: 32.9 g/dL (ref 30.0–36.0)
MCHC: 32 g/dL (ref 30.0–36.0)
Platelets: 266 10*3/uL (ref 150–400)
RDW: 16.4 % — ABNORMAL HIGH (ref 11.5–15.5)
RDW: 16.5 % — ABNORMAL HIGH (ref 11.5–15.5)
WBC: 16.8 10*3/uL — ABNORMAL HIGH (ref 4.0–10.5)

## 2012-02-05 LAB — IRON AND TIBC
Saturation Ratios: 3 % — ABNORMAL LOW (ref 20–55)
UIBC: 304 ug/dL (ref 125–400)

## 2012-02-05 MED ORDER — ACETAMINOPHEN 650 MG RE SUPP: 650.0000 mg | Freq: Four times a day (QID) | RECTAL | Status: DC | PRN

## 2012-02-05 MED ORDER — ALPRAZOLAM 0.5 MG PO TABS: 0.5000 mg | ORAL_TABLET | Freq: Every day | ORAL | Status: DC | PRN

## 2012-02-05 MED ORDER — METRONIDAZOLE IN NACL 5-0.79 MG/ML-% IV SOLN
500.0000 mg | Freq: Three times a day (TID) | INTRAVENOUS | Status: DC
  Administered 2012-02-05: 500 mg via INTRAVENOUS
  Filled 2012-02-05 (×3): qty 100

## 2012-02-05 MED ORDER — OXYCODONE-ACETAMINOPHEN 5-325 MG PO TABS
1.0000 | ORAL_TABLET | ORAL | Status: DC | PRN
  Administered 2012-02-05 – 2012-02-06 (×3): 1 via ORAL
  Filled 2012-02-05 (×3): qty 1

## 2012-02-05 MED ORDER — METRONIDAZOLE IN NACL 5-0.79 MG/ML-% IV SOLN: 500.0000 mg | Freq: Three times a day (TID) | INTRAVENOUS | Status: DC

## 2012-02-05 MED ORDER — ONDANSETRON HCL 4 MG PO TABS: 4.0000 mg | ORAL_TABLET | Freq: Four times a day (QID) | ORAL | Status: DC | PRN

## 2012-02-05 MED ORDER — FAMOTIDINE 20 MG PO TABS
20.0000 mg | ORAL_TABLET | Freq: Two times a day (BID) | ORAL | Status: DC
  Administered 2012-02-05 – 2012-02-06 (×3): 20 mg via ORAL
  Filled 2012-02-05 (×4): qty 1

## 2012-02-05 MED ORDER — METRONIDAZOLE 500 MG PO TABS
500.0000 mg | ORAL_TABLET | Freq: Two times a day (BID) | ORAL | Status: DC
  Administered 2012-02-05 – 2012-02-06 (×3): 500 mg via ORAL
  Filled 2012-02-05 (×4): qty 1

## 2012-02-05 MED ORDER — MORPHINE SULFATE 2 MG/ML IJ SOLN: 1.0000 mg | INTRAMUSCULAR | Status: DC | PRN

## 2012-02-05 MED ORDER — ONDANSETRON HCL 4 MG/2ML IJ SOLN
4.0000 mg | Freq: Four times a day (QID) | INTRAMUSCULAR | Status: DC | PRN
  Administered 2012-02-06: 4 mg via INTRAVENOUS
  Filled 2012-02-05: qty 2

## 2012-02-05 MED ORDER — SODIUM CHLORIDE 0.9 % IV SOLN
INTRAVENOUS | Status: DC
  Administered 2012-02-05: 18:00:00 via INTRAVENOUS
  Administered 2012-02-05: 125 mL/h via INTRAVENOUS

## 2012-02-05 MED ORDER — ENOXAPARIN SODIUM 40 MG/0.4ML ~~LOC~~ SOLN
40.0000 mg | SUBCUTANEOUS | Status: DC
  Administered 2012-02-05 – 2012-02-06 (×2): 40 mg via SUBCUTANEOUS
  Filled 2012-02-05 (×2): qty 0.4

## 2012-02-05 MED ORDER — PIPERACILLIN-TAZOBACTAM 3.375 G IVPB
3.3750 g | Freq: Three times a day (TID) | INTRAVENOUS | Status: DC
  Administered 2012-02-05 – 2012-02-06 (×4): 3.375 g via INTRAVENOUS
  Filled 2012-02-05 (×7): qty 50

## 2012-02-05 MED ORDER — ACETAMINOPHEN 325 MG PO TABS
650.0000 mg | ORAL_TABLET | Freq: Four times a day (QID) | ORAL | Status: DC | PRN
  Administered 2012-02-05: 650 mg via ORAL
  Filled 2012-02-05: qty 2

## 2012-02-05 MED ORDER — POTASSIUM CHLORIDE CRYS ER 20 MEQ PO TBCR
60.0000 meq | EXTENDED_RELEASE_TABLET | Freq: Four times a day (QID) | ORAL | Status: AC
  Administered 2012-02-05 (×2): 60 meq via ORAL
  Filled 2012-02-05 (×2): qty 3

## 2012-02-05 MED ORDER — PIPERACILLIN-TAZOBACTAM 3.375 G IVPB 30 MIN: 3.3750 g | Freq: Once | INTRAVENOUS | Status: DC

## 2012-02-05 MED ORDER — VALACYCLOVIR HCL 500 MG PO TABS: 500.0000 mg | ORAL_TABLET | Freq: Every day | ORAL | Status: DC | PRN

## 2012-02-05 NOTE — ED Provider Notes (Signed)
History     CSN: [id number]  Arrival date & time 02/04/12  1722   First MD Initiated Contact with Patient 02/04/12 1830      Chief Complaint  Patient presents with  . Migraine  . Vaginal Bleeding    (Consider location/radiation/quality/duration/timing/severity/associated sxs/prior treatment) HPI  25yoF previously healthy pw multiple complaints. She states that for the past one day she has experience redness, swelling, firmness of her right breast. She rates the pain as it has been diffuse. She noticed it was warm as well. She also complains of frontal headache since awakening this morning. She denies photo phobia, phonophobia. She denies neck stiffness. She states the headache was gradual onset. She rates as a 7/10 at this time. She denies numbness, 2, weakness or extremity. She is having mild lightheadedness. She also complains of having heavier than normal periods for the last one week. She's been passing some blood clots. She is not having abdominal cramping with this. She states that this is a normal time for her menstrual cycle. Denies vaginal discharge.   ED Notes, ED Provider Notes from 02/04/12 0000 to 02/04/12 17:35:39       Threasa Alpha Flueckiger, RN 02/04/2012 17:33      Pt has been having a headache all day since she woke up this am. Pt also has had a red rash on her right breast since she woke up this am. Pt has also been having a heavier than normal period for one week and has been passing some blood clots with this. Pt has been having some dizziness with this headache. No neuro deficits     Past Medical History  Diagnosis Date  . Genital HSV   . Anxiety   . Thyroid disease   . Panic attack   . Mood swings     Past Surgical History  Procedure Date  . Induced abortion 2009  . Vaginal delivery 2007    Family History  Problem Relation Age of Onset  . Breast cancer Other     History  Substance Use Topics  . Smoking status: Never Smoker   . Smokeless tobacco:  Not on file  . Alcohol Use: Yes     occas    OB History    Grav Para Term Preterm Abortions TAB SAB Ect Mult Living                  Review of Systems  All other systems reviewed and are negative.   except as noted HPI   Allergies  Review of patient's allergies indicates no known allergies.  Home Medications   Current Outpatient Rx  Name Route Sig Dispense Refill  . ALPRAZOLAM 0.5 MG PO TABS Oral Take 0.5 mg by mouth daily as needed. For anxiety    . FAMOTIDINE 20 MG PO TABS Oral Take 1 tablet (20 mg total) by mouth 2 (two) times daily. 30 tablet 0  . VALACYCLOVIR HCL 1 G PO TABS Oral Take 500 mg by mouth daily as needed. For outbreaks      BP 97/58  Pulse 95  Temp(Src) 97.7 F (36.5 C) (Oral)  Resp 18  Ht 5\' 7"  (1.702 m)  Wt 230 lb (104.327 kg)  BMI 36.02 kg/m2  SpO2 100%  LMP 01/26/2012  Physical Exam  Nursing note and vitals reviewed. Constitutional: She is oriented to person, place, and time. She appears well-developed.  HENT:  Head: Atraumatic.  Mouth/Throat: Oropharynx is clear and moist.  Eyes: Conjunctivae and EOM are  normal. Pupils are equal, round, and reactive to light.  Neck: Normal range of motion. Neck supple.  Cardiovascular: Regular rhythm, normal heart sounds and intact distal pulses.        tachycardic  Pulmonary/Chest: Effort normal and breath sounds normal. No respiratory distress. She has no wheezes. She has no rales.  Abdominal: Soft. She exhibits no distension. There is no tenderness. There is no rebound and no guarding.  Genitourinary: No vaginal discharge found.       Blood in vault No CMT No r/l adnexal ttp  Musculoskeletal: Normal range of motion.  Neurological: She is alert and oriented to person, place, and time. No cranial nerve deficit. She exhibits normal muscle tone. Coordination normal.  Skin: Skin is warm and dry. No rash noted.       Rt breast with diffuse erythema, warmth, ttp. No obvious nipple discharge. No fluctuance    Psychiatric: She has a normal mood and affect.    ED Course  Procedures (including critical care time)  Labs Reviewed  URINALYSIS, ROUTINE W REFLEX MICROSCOPIC - Abnormal; Notable for the following:    Color, Urine AMBER (*) BIOCHEMICALS MAY BE AFFECTED BY COLOR   Hgb urine dipstick LARGE (*)    Ketones, ur 15 (*)    Protein, ur 30 (*)    Leukocytes, UA SMALL (*)    All other components within normal limits  CBC - Abnormal; Notable for the following:    WBC 25.0 (*)    Hemoglobin 11.4 (*)    HCT 34.4 (*)    MCV 73.5 (*)    MCH 24.4 (*)    RDW 16.2 (*)    All other components within normal limits  BASIC METABOLIC PANEL - Abnormal; Notable for the following:    Glucose, Bld 120 (*)    All other components within normal limits  URINE MICROSCOPIC-ADD ON - Abnormal; Notable for the following:    Squamous Epithelial / LPF FEW (*)    All other components within normal limits  WET PREP, GENITAL - Abnormal; Notable for the following:    Clue Cells Wet Prep HPF POC FEW (*)    WBC, Wet Prep HPF POC FEW (*)    All other components within normal limits  POCT PREGNANCY, URINE  GC/CHLAMYDIA PROBE AMP, GENITAL   US Breast Right  02/04/2012  Clinical Data: r/o abscess; ; RIGHT BREAST LUMP. CLINICAL PICTURE OF MASTITIS.  EVALUATE FOR ABSCESS.  RIGHT BREAST ULTRASOUND  Comparison: No comparison studies available.  Findings: Multiple cystic areas are identified within the soft tissues of the right breast, including the 12 o'clock and 1 o'clock positions.  No gross soft tissue abscess can be identified within the right breast.  IMPRESSION: No definite abscess within the right breast by ultrasound.  Given the clinical history of a palpable abnormality, mammographic evaluation is recommended as underlying neoplasm could be occult on ultrasound.  I personally discussed these findings by telephone with Dr. Hyman Hopes. I stressed the importance of mammographic follow-up during this conversation.  Original  Report Authenticated By: ERIC A. MANSELL, M.D.     1. Cellulitis   2. Headache   3. Abnormal vaginal bleeding     MDM  25yoF previously healthy pw likely Rt breast abscess. She has a leukocytosis to 25,000. She is slightly tachycardic. +SIRS. She's been given vancomycin in the emergency department, right breast ultrasound reveals cystic lesions without abscess per radiology. I discussed with radiology. She will need a mammogram to evaluate for cancer.  I relayed this information to [facility] hospitalist as well as to the patient. Her headache is not concerning for meningitis. She will need outpatient OB/GYN followup for her abnormal vaginal bleeding.        Forbes Cellar, MD 02/05/12 [phone]
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
## 2012-02-05 NOTE — Progress Notes (Signed)
DAILY PROGRESS NOTE                              GENERAL INTERNAL MEDICINE [facility]  SUBJECTIVE: Feels much better, less pain and redness.  OBJECTIVE: BP 100/66  Pulse 108  Temp(Src) 98.6 F (37 C) (Oral)  Resp 18  Ht 5\' 7"  (1.702 m)  Wt 104.6 kg (230 lb 9.6 oz)  BMI 36.12 kg/m2  SpO2 99%  LMP 01/26/2012  Intake/Output Summary (Last 24 hours) at 02/05/12 1143 Last data filed at 02/05/12 0900  Gross per 24 hour  Intake    360 ml  Output      0 ml  Net    360 ml                      Weight change:  Physical Exam: General: Alert and awake oriented x3 not in any acute distress. HEENT: anicteric sclera, pupils equal reactive to light and accommodation CVS: S1-S2 heard, no murmur rubs or gallops Chest: clear to auscultation bilaterally, no wheezing rales or rhonchi Abdomen:  normal bowel sounds, soft, nontender, nondistended, no organomegaly Neuro: Cranial nerves II-XII intact, no focal neurological deficits Extremities: no cyanosis, no clubbing or edema noted bilaterally   Lab Results:  Basename 02/05/12 0515 02/05/12 0206 02/04/12 1735  NA 137 -- 137  K 3.0* -- 3.6  CL 105 -- 100  CO2 23 -- 23  GLUCOSE 114* -- 120*  BUN 8 -- 10  CREATININE 0.67 0.65 --  CALCIUM 8.6 -- 9.6  MG -- -- --  PHOS -- -- --    Basename 02/05/12 0515  AST 9  ALT 6  ALKPHOS 59  BILITOT 0.6  PROT 6.8  ALBUMIN 3.1*   No results found for this basename: LIPASE:2,AMYLASE:2 in the last 72 hours  Basename 02/05/12 0515 02/05/12 0206  WBC 14.3* 16.8*  NEUTROABS 11.1* --  HGB 9.5* 10.9*  HCT 29.7* 33.1*  MCV 74.3* 74.0*  PLT 239 266   Basename 02/05/12 0206  VITAMINB12 --  FOLATE --  FERRITIN --  TIBC --  IRON --  RETICCTPCT 1.2    Micro Results: Recent Results (from the past 240 hour(s))  WET PREP, GENITAL     Status: Abnormal   Collection Time   02/04/12  8:05 PM      Component Value Range Status Comment   Yeast Wet Prep HPF POC NONE SEEN  NONE SEEN  Final    Trich, Wet Prep NONE SEEN  NONE SEEN  Final    Clue Cells Wet Prep HPF POC FEW (*) NONE SEEN  Final    WBC, Wet Prep HPF POC FEW (*) NONE SEEN  Final     Studies/Results: US Breast Right  02/04/2012  Clinical Data: r/o abscess; ; RIGHT BREAST LUMP. CLINICAL PICTURE OF MASTITIS.  EVALUATE FOR ABSCESS.  RIGHT BREAST ULTRASOUND  Comparison: No comparison studies available.  Findings: Multiple cystic areas are identified within the soft tissues of the right breast, including the 12 o'clock and 1 o'clock positions.  No gross soft tissue abscess can be identified within the right breast.  IMPRESSION: No definite abscess within the right breast by ultrasound.  Given the clinical history of a palpable abnormality, mammographic evaluation is recommended as underlying neoplasm could be occult on ultrasound.  I personally discussed these findings by telephone with Dr. Hyman Hopes. I stressed the importance of mammographic follow-up during this conversation.  Original Report Authenticated By: ERIC A. MANSELL, M.D.   Medications: Scheduled Meds:   . enoxaparin  40 mg Subcutaneous Q24H  . famotidine  20 mg Oral BID  . HYDROmorphone  1 mg Intravenous Once  . metroNIDAZOLE  500 mg Oral Q12H  .  morphine injection  4 mg Intravenous Once  . piperacillin-tazobactam  3.375 g Intravenous Once  . piperacillin-tazobactam (ZOSYN)  IV  3.375 g Intravenous Q8H  . sodium chloride  1,000 mL Intravenous Once  . vancomycin  1,000 mg Intravenous Q8H  . DISCONTD: metronidazole  500 mg Intravenous Q8H  . DISCONTD: metronidazole  500 mg Intravenous Q8H   Continuous Infusions:   . sodium chloride 125 mL/hr (02/05/12 0207)   PRN Meds:.acetaminophen, acetaminophen, ALPRAZolam, morphine injection, ondansetron (ZOFRAN) IV, ondansetron, oxyCODONE-acetaminophen, valACYclovir  ASSESSMENT & PLAN: Principal Problem:  *Cellulitis of breast Active Problems:  Anemia   Right breast cellulitis -Patient started on broad-spectrum  antibiotics vancomycin and Zosyn. -No recent history of breast-feeding, ultrasound ruled out abscesses and mastitis. -Patient does not have open ulcers/wounds, I will reevaluate for the need for the Zosyn. -Patient reported axillary lymphadenopathy which is improved now.  Bacterial vaginosis -7 days of 500 mg of Flagyl, twice a day.  Anemia -Chronic anemia, baseline hemoglobin is around 11.5 -Hemoglobin dropped from 11.4 to 9.5 over night. -Patient is asymptomatic, this is likely secondary to hemodilution from IV fluids, and patient has her period currently. -Will followup closely all check CBC in the morning.   LOS: 1 day   Melinda Jimenez A 02/05/2012, 11:43 AM

## 2012-02-05 NOTE — Care Management Note (Signed)
    Page 1 of 1   02/06/2012     11:27:55 AM   CARE MANAGEMENT NOTE 02/06/2012  Patient:  Melinda Jimenez, Melinda Jimenez   Account Number:  [id number]  Date Initiated:  02/05/2012  Documentation initiated by:  Donn Pierini  Subjective/Objective Assessment:   Pt admitted with cellulitis of breast     Action/Plan:   PTA pt lived at home with sig. other and young son. independent with ADLs   Anticipated DC Date:  02/06/2012   Anticipated DC Plan:  HOME/SELF CARE      DC Planning Services  CM consult      Choice offered to / List presented to:             Status of service:  Completed, signed off Medicare Important Message given?   (If response is "NO", the following Medicare IM given date fields will be blank) Date Medicare IM given:   Date Additional Medicare IM given:    Discharge Disposition:  HOME/SELF CARE  Per UR Regulation:    If discussed at Long Length of Stay Meetings, dates discussed:    Comments:  PCP- [facility]  02/06/12 11:23 Letha Cape RN, BSN [phone] patient is for discharge today, she has an [facility] apt for 5/22 at 2:30 pm which is a full exam, during the exam she will remind the MD she needs a mammogram referral within a month 's time and they will make the referral from [facility].  This information was given to [facility] as well.  02/05/12- 1630- Donn Pierini RN, BSN [phone] Spoke with pt at bedside- per conversation pt states she lives at home, has transportation at discharge. She reports she has a current Medicaid card and uses it for medications. She states she has an upcoming [facility] appointment at the end of this month. NCM to follow- no anticipated d/c needs.

## 2012-02-05 NOTE — Progress Notes (Signed)
Utilization review complete 

## 2012-02-05 NOTE — ED Notes (Signed)
Pt. Alert and oriented, transferred to room via stretcher, NAD noted

## 2012-02-06 DIAGNOSIS — L03319 Cellulitis of trunk, unspecified: Secondary | ICD-10-CM

## 2012-02-06 DIAGNOSIS — L02219 Cutaneous abscess of trunk, unspecified: Secondary | ICD-10-CM

## 2012-02-06 DIAGNOSIS — R112 Nausea with vomiting, unspecified: Secondary | ICD-10-CM

## 2012-02-06 LAB — CBC
HCT: 31.4 % — ABNORMAL LOW (ref 36.0–46.0)
MCHC: 31.8 g/dL (ref 30.0–36.0)
MCV: 75.5 fL — ABNORMAL LOW (ref 78.0–100.0)
Platelets: 259 10*3/uL (ref 150–400)
RDW: 16.7 % — ABNORMAL HIGH (ref 11.5–15.5)

## 2012-02-06 LAB — BASIC METABOLIC PANEL
BUN: 5 mg/dL — ABNORMAL LOW (ref 6–23)
CO2: 25 mEq/L (ref 19–32)
Calcium: 8.9 mg/dL (ref 8.4–10.5)
Chloride: 106 mEq/L (ref 96–112)
Creatinine, Ser: 0.6 mg/dL (ref 0.50–1.10)
GFR calc Af Amer: >90 mL/min (ref >90)
Potassium: 3.6 mEq/L (ref 3.5–5.1)
Sodium: 138 mEq/L (ref 135–145)

## 2012-02-06 MED ORDER — ONDANSETRON HCL 4 MG PO TABS: 4.0000 mg | ORAL_TABLET | Freq: Three times a day (TID) | ORAL | Status: AC | PRN

## 2012-02-06 MED ORDER — METRONIDAZOLE 500 MG PO TABS
2000.0000 mg | ORAL_TABLET | Freq: Once | ORAL | Status: AC
  Administered 2012-02-06: 2000 mg via ORAL
  Filled 2012-02-06: qty 4

## 2012-02-06 MED ORDER — OXYCODONE-ACETAMINOPHEN 5-325 MG PO TABS: 1.0000 | ORAL_TABLET | Freq: Three times a day (TID) | ORAL | Status: AC | PRN

## 2012-02-06 MED ORDER — SULFAMETHOXAZOLE-TRIMETHOPRIM 800-160 MG PO TABS: 1.0000 | ORAL_TABLET | Freq: Two times a day (BID) | ORAL | Status: AC

## 2012-02-06 NOTE — Progress Notes (Signed)
ANTIBIOTIC CONSULT NOTE - Follow-up  Pharmacy Consult for Vancomycin / Zosyn Indication:  Right breast cellulitis/abscess  No Known Allergies  Patient Measurements: Height: 5\' 7"  (170.2 cm) Weight: 230 lb 9.6 oz (104.6 kg) IBW/kg (Calculated) : 61.6  Adjusted Body Weight: 104 kg  Vital Signs: Temp: 97.3 F (36.3 C) (05/08 0500) Temp src: Oral (05/08 0500) BP: 94/57 mmHg (05/08 0500) Pulse Rate: 84  (05/08 0500) 05/07 0701 - 05/08 0700 In: 1200 [P.O.:1200] Out: -  Labs:  Basename 02/05/12 0515 02/05/12 0206 02/04/12 1735  WBC 14.3* 16.8* 25.0*  HGB 9.5* 10.9* 11.4*  PLT 239 266 323  LABCREA -- -- --  CREATININE 0.67 0.65 0.69   Estimated Creatinine Clearance: 136.1 ml/min (by C-G formula based on Cr of 0.67).   Microbiology: Recent Results (from the past 720 hour(s))  WET PREP, GENITAL     Status: Abnormal   Collection Time   02/04/12  8:05 PM      Component Value Range Status Comment   Yeast Wet Prep HPF POC NONE SEEN  NONE SEEN  Final    Trich, Wet Prep NONE SEEN  NONE SEEN  Final    Clue Cells Wet Prep HPF POC FEW (*) NONE SEEN  Final    WBC, Wet Prep HPF POC FEW (*) NONE SEEN  Final     Medical History: Past Medical History  Diagnosis Date  . Genital HSV   . Anxiety   . Thyroid disease   . Panic attack   . Mood swings    Assessment:   In ED with lower abdominal cramping and vaginal bleeding x 1 week.  Red rash noted on right breast.  U/S negative for abscess and mastitis. Temp = afebrile, WBC (in process), but trending down with 5/7 labs.   D#2 vancomycin and zosyn. Blood cultures pending. Vaginal wet prep = Trich on flagyl.    Goal of Therapy:  Vancomycin trough level 10-15 mcg/ml  Plan:    Vancomycin 1 gram IV q8hrs   Zosyn 3.375gm IV q8h with 4h infusion   Suggest de-escalation and possible change to PO   Will follow renal function and clinical status.   Will consider checking trough level if  > 5 days treatment needed.  Dannielle Huh,  [place] Pager: [phone] 02/06/2012,7:56 AM

## 2012-02-06 NOTE — Progress Notes (Signed)
Metta Clines to be D/C'd Home per MD order.  Discussed with the patient the After Visit Summary and all questions fully answered.  Susann Givens, RN, [facility] 02/06/2012 4:50 PM

## 2012-02-06 NOTE — Discharge Summary (Signed)
PATIENT DETAILS Name: Melinda Jimenez Age: 26 y.o. Sex: female Date of Birth: 1986-02-25 MRN: [id number]. Admit Date: 02/04/2012 Admitting Physician: Eduard Clos, MD WUJ:WJXBJYN,WGNFAOZH, MD, MD  PRIMARY DISCHARGE DIAGNOSIS:  Principal Problem:  *Cellulitis of breast Active Problems:  Anemia Bacterial Vaginosis Nausea/Vomiting      PAST MEDICAL HISTORY: Past Medical History  Diagnosis Date  . Genital HSV   . Anxiety   . Thyroid disease   . Panic attack   . Mood swings     DISCHARGE MEDICATIONS: Medication List  As of 02/06/2012  2:51 PM   TAKE these medications         ALPRAZolam 0.5 MG tablet   Commonly known as: XANAX   Take 0.5 mg by mouth daily as needed. For anxiety      famotidine 20 MG tablet   Commonly known as: PEPCID   Take 1 tablet (20 mg total) by mouth 2 (two) times daily.      ondansetron 4 MG tablet   Commonly known as: ZOFRAN   Take 1 tablet (4 mg total) by mouth every 8 (eight) hours as needed for nausea.      oxyCODONE-acetaminophen 5-325 MG per tablet   Commonly known as: PERCOCET   Take 1-2 tablets by mouth every 8 (eight) hours as needed for pain.      sulfamethoxazole-trimethoprim 800-160 MG per tablet   Commonly known as: BACTRIM DS,SEPTRA DS   Take 1 tablet by mouth 2 (two) times daily.      valACYclovir 1000 MG tablet   Commonly known as: VALTREX   Take 500 mg by mouth daily as needed. For outbreaks             BRIEF HPI:  See H&P, Labs, Consult and Test reports for all details in brief,26 year-old female presented to the ER because of worsening right-sided breast pain since one day prior to admission. Patient has been experiencing pain since she woke up the day of admission in the right breast. Has been having some subjective feeling of fever chills and nausea vomiting. In the ED patient was found to be having leukocytosis with examination showing indurated right breast skin with tenderness and no active discharge. ER physician  also did a pelvic exam and wet mount is positive for Gardnerella vaginalis. Patient had a sonogram of the right breast which does not show any abscess. Patient has been started on IV antibiotics and was admitted for further management.  CONSULTATIONS:   None  PERTINENT RADIOLOGIC STUDIES: US Breast Right  02/04/2012  Clinical Data: r/o abscess; ; RIGHT BREAST LUMP. CLINICAL PICTURE OF MASTITIS.  EVALUATE FOR ABSCESS.  RIGHT BREAST ULTRASOUND  Comparison: No comparison studies available.  Findings: Multiple cystic areas are identified within the soft tissues of the right breast, including the 12 o'clock and 1 o'clock positions.  No gross soft tissue abscess can be identified within the right breast.  IMPRESSION: No definite abscess within the right breast by ultrasound.  Given the clinical history of a palpable abnormality, mammographic evaluation is recommended as underlying neoplasm could be occult on ultrasound.  I personally discussed these findings by telephone with Dr. Hyman Hopes. I stressed the importance of mammographic follow-up during this conversation.  Original Report Authenticated By: ERIC A. MANSELL, M.D.     PERTINENT LAB RESULTS: CBC:  Basename 02/06/12 0655 02/05/12 0515  WBC 6.9 14.3*  HGB 10.0* 9.5*  HCT 31.4* 29.7*  PLT 259 239   CMET CMP  Component Value Date/Time   NA 138 02/06/2012 0655   K 3.6 02/06/2012 0655   CL 106 02/06/2012 0655   CO2 25 02/06/2012 0655   GLUCOSE 142* 02/06/2012 0655   BUN 5* 02/06/2012 0655   CREATININE 0.60 02/06/2012 0655   CALCIUM 8.9 02/06/2012 0655   PROT 6.8 02/05/2012 0515   ALBUMIN 3.1* 02/05/2012 0515   AST 9 02/05/2012 0515   ALT 6 02/05/2012 0515   ALKPHOS 59 02/05/2012 0515   BILITOT 0.6 02/05/2012 0515   GFRNONAA >90 02/06/2012 0655   GFRAA >90 02/06/2012 0655    GFR Estimated Creatinine Clearance: 136.1 ml/min (by C-G formula based on Cr of 0.6). No results found for this basename: LIPASE:2,AMYLASE:2 in the last 72 hours No results found for this  basename: CKTOTAL:3,CKMB:3,CKMBINDEX:3,TROPONINI:3 in the last 72 hours No components found with this basename: POCBNP:3 No results found for this basename: DDIMER:2 in the last 72 hours No results found for this basename: HGBA1C:2 in the last 72 hours No results found for this basename: CHOL:2,HDL:2,LDLCALC:2,TRIG:2,CHOLHDL:2,LDLDIRECT:2 in the last 72 hours No results found for this basename: TSH,T4TOTAL,FREET3,T3FREE,THYROIDAB in the last 72 hours  Basename 02/05/12 0206  VITAMINB12 379  FOLATE 11.2  FERRITIN 77  TIBC 315  IRON 11*  RETICCTPCT 1.2   Coags: No results found for this basename: PT:2,INR:2 in the last 72 hours Microbiology: Recent Results (from the past 240 hour(s))  WET PREP, GENITAL     Status: Abnormal   Collection Time   02/04/12  8:05 PM      Component Value Range Status Comment   Yeast Wet Prep HPF POC NONE SEEN  NONE SEEN  Final    Trich, Wet Prep NONE SEEN  NONE SEEN  Final    Clue Cells Wet Prep HPF POC FEW (*) NONE SEEN  Final    WBC, Wet Prep HPF POC FEW (*) NONE SEEN  Final   CULTURE, BLOOD (ROUTINE X 2)     Status: Normal (Preliminary result)   Collection Time   02/05/12  1:55 AM      Component Value Range Status Comment   Specimen Description BLOOD RIGHT ARM   Final    Special Requests BOTTLES DRAWN AEROBIC ONLY 1CC   Final    Culture  Setup Time [id number]   Final    Culture     Final    Value:        BLOOD CULTURE RECEIVED NO GROWTH TO DATE CULTURE WILL BE HELD FOR 5 DAYS BEFORE ISSUING A FINAL NEGATIVE REPORT   Report Status PENDING   Incomplete   CULTURE, BLOOD (ROUTINE X 2)     Status: Normal (Preliminary result)   Collection Time   02/05/12  2:10 AM      Component Value Range Status Comment   Specimen Description BLOOD LEFT ARM   Final    Special Requests BOTTLES DRAWN AEROBIC ONLY 8CC   Final    Culture  Setup Time [id number]   Final    Culture     Final    Value:        BLOOD CULTURE RECEIVED NO GROWTH TO DATE CULTURE WILL BE HELD FOR 5  DAYS BEFORE ISSUING A FINAL NEGATIVE REPORT   Report Status PENDING   Incomplete      BRIEF HOSPITAL COURSE:   Principal Problem:  *Cellulitis of breast -Patient was admitted to the hospital and started on intravenous vancomycin and Zosyn, she quit in may clinical improvement and her leukocytosis  decreased as well. She has been afebrile during her stay as well. -During my rounds this morning, patient requested she be discharged as she has a 30-year-old son the she needs to go back to, she claims that she has made significant clinical improvement, the right breast specifically in the upper and inner quadrant-on exam has only very minimal erythema in last, significant decrease in induration as well. It is only mildly tender. Both the patient and the patient's boyfriend bedside came significant clinical improvement. On discharge she will be transitioned to Bactrim, she has been asked to followup with her primary care practitioner, and upon followup with her primary care practitioner she has been asked to be referred to the breast Center for a possible mammography. Please also note a ultrasound of the right breast did not show any definite abscess  Bacterial vaginosis -She has not been tolerating oral Flagyl well and has been vomiting/nausea after she takes the Flagyl. I will give her one dose of 2 g of Flagyl prior to discharge. She has also been provided with Zofran. We'll continue to monitor for the next few hours, if she has no further episodes of nausea vomiting she will be discharged later this evening. Please note she has tolerated her diet without any issues of nausea vomiting today.  Nausea with occasional vomiting -This is perhaps related to Flagyl, as noted above she will be given one dose of Flagyl-prior to discharge. She will be observed for the next 2 hours and she continues to do well without any issues of nausea and vomiting she will be discharged home later today.  Anemia -Slight drop  in hemoglobin during this admission was probably secondary to hemodilution from intravenous fluids. Hemoglobin on discharge is 10.0, up from 9.5 on 02/05/2012.   TODAY-DAY OF DISCHARGE:  Subjective:   Melinda Jimenez today has no headache,no chest abdominal pain,no new weakness tingling or numbness, feels much better wants to go home today.   Objective:   Blood pressure 94/57, pulse 84, temperature 97.3 F (36.3 C), temperature source Oral, resp. rate 16, height 5\' 7"  (1.702 m), weight 104.6 kg (230 lb 9.6 oz), last menstrual period 01/26/2012, SpO2 99.00%.  Intake/Output Summary (Last 24 hours) at 02/06/12 1451 Last data filed at 02/06/12 0500  Gross per 24 hour  Intake    600 ml  Output      0 ml  Net    600 ml    Exam Awake Alert, Oriented *3, No new F.N deficits, Normal affect [place].AT,PERRAL Supple Neck,No JVD, No cervical lymphadenopathy appriciated.  Right breast examined with Chaperone-Significant decreased erythema/induration (per patient), very minimal tenderness-in upper and innner quadrant Symmetrical Chest wall movement, Good air movement bilaterally, CTAB RRR,No Gallops,Rubs or new Murmurs, No Parasternal Heave +ve B.Sounds, Abd Soft, Non tender, No organomegaly appriciated, No rebound -guarding or rigidity. No Cyanosis, Clubbing or edema, No new Rash or bruise  DISPOSITION: Home   DISCHARGE INSTRUCTIONS:    Follow-up Information    Follow up with [facility],ELM EUGENE on 02/20/2012. (at 2:30 , [facility] will make referral fo Mammogram for  a month from today, patient  needs to remind them during her apt)    Contact information:   [phone]        Total Time spent on discharge equals 45 minutes.  SignedJeoffrey Massed 02/06/2012 2:51 PM

## 2012-02-07 ENCOUNTER — Other Ambulatory Visit (HOSPITAL_COMMUNITY): Payer: Medicaid Other

## 2012-02-11 LAB — CULTURE, BLOOD (ROUTINE X 2)
Culture  Setup Time: 201305070841
Culture: NO GROWTH

## 2012-02-16 ENCOUNTER — Encounter (HOSPITAL_COMMUNITY): Payer: Self-pay | Admitting: Emergency Medicine

## 2012-02-16 ENCOUNTER — Emergency Department (INDEPENDENT_AMBULATORY_CARE_PROVIDER_SITE_OTHER)
Admission: EM | Admit: 2012-02-16 | Discharge: 2012-02-16 | Disposition: A | Discharge status: Home / Self Care | Payer: Medicaid Other | Source: Home / Self Care | Attending: Emergency Medicine | Admitting: Emergency Medicine

## 2012-02-16 DIAGNOSIS — B373 Candidiasis of vulva and vagina: Secondary | ICD-10-CM

## 2012-02-16 LAB — POCT URINALYSIS DIP (DEVICE)
Bilirubin Urine: NEGATIVE
Hgb urine dipstick: NEGATIVE
Ketones, ur: NEGATIVE mg/dL
Leukocytes, UA: NEGATIVE
Nitrite: NEGATIVE
Protein, ur: NEGATIVE mg/dL
Urobilinogen, UA: 0.2 mg/dL (ref 0.0–1.0)
pH: 6 (ref 5.0–8.0)

## 2012-02-16 LAB — POCT PREGNANCY, URINE: Preg Test, Ur: NEGATIVE

## 2012-02-16 MED ORDER — FLUCONAZOLE 150 MG PO TABS: 150.0000 mg | ORAL_TABLET | Freq: Once | ORAL | Status: AC

## 2012-02-16 NOTE — ED Notes (Addendum)
Patient recently on antibiotics, reports getting yeast infections easily, did not have medicine for yeast infections.  Denies discharge.

## 2012-02-16 NOTE — Discharge Instructions (Signed)
Candida Infection, Adult A candida infection (also called yeast, fungus and Monilia infection) is an overgrowth of yeast that can occur anywhere on the body. A yeast infection commonly occurs in warm, moist body areas. Usually, the infection remains localized but can spread to become a systemic infection. A yeast infection may be a sign of a more severe disease such as diabetes, leukemia, or AIDS. A yeast infection can occur in both men and women. In women, Candida vaginitis is a vaginal infection. It is one of the most common causes of vaginitis. Men usually do not have symptoms or know they have an infection until other problems develop. Men may find out they have a yeast infection because their sex partner has a yeast infection. Uncircumcised men are more likely to get a yeast infection than circumcised men. This is because the uncircumcised glans is not exposed to air and does not remain as dry as that of a circumcised glans. Older adults may develop yeast infections around dentures. CAUSES  Women  Antibiotics.   Steroid medication taken for a long time.   Being overweight (obese).   Diabetes.   Poor immune condition.   Certain serious medical conditions.   Immune suppressive medications for organ transplant patients.   Chemotherapy.   Pregnancy.   Menstration.   Stress and fatigue.   Intravenous drug use.   Oral contraceptives.   Wearing tight-fitting clothes in the crotch area.   Catching it from a sex partner who has a yeast infection.   Spermicide.   Intravenous, urinary, or other catheters.  Men  Catching it from a sex partner who has a yeast infection.   Having oral or anal sex with a person who has the infection.   Spermicide.   Diabetes.   Antibiotics.   Poor immune system.   Medications that suppress the immune system.   Intravenous drug use.   Intravenous, urinary, or other catheters.  SYMPTOMS  Women  Thick, white vaginal discharge.    Vaginal itching.   Redness and swelling in and around the vagina.   Irritation of the lips of the vagina and perineum.   Blisters on the vaginal lips and perineum.   Painful sexual intercourse.   Low blood sugar (hypoglycemia).   Painful urination.   Bladder infections.   Intestinal problems such as constipation, indigestion, bad breath, bloating, increase in gas, diarrhea, or loose stools.  Men  Men may develop intestinal problems such as constipation, indigestion, bad breath, bloating, increase in gas, diarrhea, or loose stools.   Dry, cracked skin on the penis with itching or discomfort.   Jock itch.   Dry, flaky skin.   Athlete's foot.   Hypoglycemia.  DIAGNOSIS  Women  A history and an exam are performed.   The discharge may be examined under a microscope.   A culture may be taken of the discharge.  Men  A history and an exam are performed.   Any discharge from the penis or areas of cracked skin will be looked at under the microscope and cultured.   Stool samples may be cultured.  TREATMENT  Women  Vaginal antifungal suppositories and creams.   Medicated creams to decrease irritation and itching on the outside of the vagina.   Warm compresses to the perineal area to decrease swelling and discomfort.   Oral antifungal medications.   Medicated vaginal suppositories or cream for repeated or recurrent infections.   Wash and dry the irritation areas before applying the cream.     Eating yogurt with lactobacillus may help with prevention and treatment.   Sometimes painting the vagina with gentian violet solution may help if creams and suppositories do not work.  Men  Antifungal creams and oral antifungal medications.   Sometimes treatment must continue for 30 days after the symptoms go away to prevent recurrence.  HOME CARE INSTRUCTIONS  Women  Use cotton underwear and avoid tight-fitting clothing.   Avoid colored, scented toilet paper and  deodorant tampons or pads.   Do not douche.   Keep your diabetes under control.   Finish all the prescribed medications.   Keep your skin clean and dry.   Consume milk or yogurt with lactobacillus active culture regularly. If you get frequent yeast infections and think that is what the infection is, there are over-the-counter medications that you can get. If the infection does not show healing in 3 days, talk to your caregiver.   Tell your sex partner you have a yeast infection. Your partner may need treatment also, especially if your infection does not clear up or recurs.  Men  Keep your skin clean and dry.   Keep your diabetes under control.   Finish all prescribed medications.   Tell your sex partner that you have a yeast infection so they can be treated if necessary.  SEEK MEDICAL CARE IF:   Your symptoms do not clear up or worsen in one week after treatment.   You have an oral temperature above 102 F (38.9 C).   You have trouble swallowing or eating for a prolonged time.   You develop blisters on and around your vagina.   You develop vaginal bleeding and it is not your menstrual period.   You develop abdominal pain.   You develop intestinal problems as mentioned above.   You get weak or lightheaded.   You have painful or increased urination.   You have pain during sexual intercourse.  MAKE SURE YOU:   Understand these instructions.   Will watch your condition.   Will get help right away if you are not doing well or get worse.  Document Released: 10/25/2004 Document Revised: 09/06/2011 Document Reviewed: 02/06/2010 ExitCare Patient Information 2012 [employer].  Vaginitis Vaginitis is an infection. It causes soreness, swelling, and redness (inflammation) of the vagina. Many of these infections are sexually transmitted diseases (STDs). Having unprotected sex can cause further problems and complications such as:  Chronic pelvic pain.   Infertility.     Unwanted pregnancy.   Abortion.   Tubal pregnancy.   Infection passed on to the newborn.   Cancer.  CAUSES   Monilia. This is a yeast or fungus infection, not an STD.   Bacterial vaginosis. The normal balance of bacteria in the vagina is disrupted and is replaced by an overgrowth of certain bacteria.   Gonorrhea, chlamydia. These are bacterial infections that are STDs.   Vaginal sponges, diaphragms, and intrauterine devices.   Trichomoniasis. This is a STD infection caused by a parasite.   Viruses like herpes and human papillomavirus. Both are STDs.   Pregnancy.   Immunosuppression. This occurs with certain conditions such as HIV infection or cancer.   Using bubble bath.   Taking certain antibiotic medicines.   Sporadic recurrence can occur if you become sick.   Diabetes.   Steroids.   Allergic reaction. If you have an allergy to:   Douches.   Soaps.   Spermicides.   Condoms.   Scented tampons or vaginal sprays.  SYMPTOMS     Abnormal vaginal discharge.   Itching of the vagina.   Pain in the vagina.   Swelling of the vagina.  In some cases, there are no symptoms. TREATMENT  Treatment will vary depending on the type of infection.  Bacteria or trichomonas are usually treated with oral antibiotics and sometimes vaginal cream or suppositories.   Monilia vaginitis is usually treated with vaginal creams, suppositories, or oral antifungal pills.   Viral vaginitis has no cure. However, the symptoms of herpes (a viral vaginitis) can be treated to relieve the discomfort. Human papillomavirus has no symptoms. However, there are treatments for the diseases caused by human papillomavirus.   With allergic vaginitis, you need to stop using the product that is causing the problem. Vaginal creams can be used to treat the symptoms.   When treating an STD, the sex partner should also be treated.  HOME CARE INSTRUCTIONS   Take all the medicines as directed by  your caregiver.   Do not use scented tampons, soaps, or vaginal sprays.   Do not douche.   Tell your sex partner if you have a vaginal infection or an STD.   Do not have sexual intercourse until you have treated the vaginitis.   Practice safe sex by using condoms.  SEEK MEDICAL CARE IF:   You have abdominal pain.   Your symptoms get worse during treatment.  Document Released: 07/15/2007 Document Revised: 09/06/2011 Document Reviewed: 03/10/2009 ExitCare Patient Information 2012 [employer]. 

## 2012-02-16 NOTE — ED Provider Notes (Signed)
Chief Complaint  Patient presents with  . Vaginal Itching    History of Present Illness:   Melinda Jimenez is a 26 year old female who has had a four-day history of vaginal itching. She denies any discharge, odor, pain, or external inflammation or lesions. She's had no dysuria, frequency, urgency. She denies any pelvic pain, fever, chills, nausea, or vomiting. She was just hospitalized because of cellulitis of her breast and has been on antibiotics. She states that whenever she is on antibiotics usually gets yeast infection. She tried to get her prescription from her private doctor's office but wasn't able to do so.  Review of Systems:  Other than noted above, the patient denies any of the following symptoms: Systemic:  No fever, chills, sweats, fatigue, or weight loss. GI:  No abdominal pain, nausea, anorexia, vomiting, diarrhea, constipation, melena or hematochezia. GU:  No dysuria, frequency, urgency, hematuria, vaginal discharge, itching, or abnormal vaginal bleeding. Skin:  No rash or itching.   PMFSH:  Past medical history, family history, social history, meds, and allergies were reviewed.  Physical Exam:   Vital signs:  BP 112/75  Pulse 70  Temp(Src) 97.6 F (36.4 C) (Oral)  Resp 18  SpO2 98%  LMP 01/26/2012 General:  Alert, oriented and in no distress. Lungs:  Breath sounds clear and equal bilaterally.  No wheezes, rales or rhonchi. Heart:  Regular rhythm.  No gallops or murmers. Abdomen:  Soft, flat and non-distended.  No organomegaly or mass.  No tenderness, guarding or rebound.  Bowel sounds normally active. Pelvic exam:  External genitalia unremarkable. Vaginal and cervical mucosa are unremarkable. There was minimal discharge. No cervical motion tenderness. Uterus was mid position, normal in size, shape, and nontender. No adnexal masses or tenderness. Skin:  Clear, warm and dry.  Labs:   Results for orders placed during the hospital encounter of 02/16/12  POCT URINALYSIS DIP  (DEVICE)      Component Value Range   Glucose, UA NEGATIVE  NEGATIVE (mg/dL)   Bilirubin Urine NEGATIVE  NEGATIVE    Ketones, ur NEGATIVE  NEGATIVE (mg/dL)   Specific Gravity, Urine 1.020  1.005 - 1.030    Hgb urine dipstick NEGATIVE  NEGATIVE    pH 6.0  5.0 - 8.0    Protein, ur NEGATIVE  NEGATIVE (mg/dL)   Urobilinogen, UA 0.2  0.0 - 1.0 (mg/dL)   Nitrite NEGATIVE  NEGATIVE    Leukocytes, UA NEGATIVE  NEGATIVE   POCT PREGNANCY, URINE      Component Value Range   Preg Test, Ur NEGATIVE  NEGATIVE      Assessment:  The encounter diagnosis was Candida vaginitis.  Plan:   1.  The following meds were prescribed:   New Prescriptions   FLUCONAZOLE (DIFLUCAN) 150 MG TABLET    Take 1 tablet (150 mg total) by mouth once.   2.  The patient was instructed in symptomatic care and handouts were given. 3.  The patient was told to return if becoming worse in any way, if no better in 3 or 4 days, and given some red flag symptoms that would indicate earlier return.    Reuben Likes, MD 02/16/12 [phone]

## 2012-02-18 LAB — GC/CHLAMYDIA PROBE AMP, GENITAL: Chlamydia, DNA Probe: NEGATIVE

## 2012-02-19 ENCOUNTER — Telehealth (HOSPITAL_COMMUNITY): Payer: Self-pay | Admitting: *Deleted

## 2012-02-19 MED ORDER — METRONIDAZOLE 250 MG PO TABS: 250.0000 mg | ORAL_TABLET | Freq: Three times a day (TID) | ORAL | Status: AC

## 2012-02-19 NOTE — ED Notes (Signed)
GC/Chlamydia neg., Wet prep: Many clue cells, mod. WBC's.  Lab shown to Dr. Artis Flock and he E- prescribed Flagyl to [facility] on [address].  I called pt.  Pt. verified x 2 and given results.  Pt. said she was just treated for it on 5/8. I told her she had it again and needs tx. Pt. said the last time she took Flagyl she vomited for 5 days. I told her that is because they gave her 2000 mg. I explained she was only getting 250 mg. TID. She asked if there was any other way to treat. I told her there was Metrogel to insert vaginally, but it is more expensive. Pt. tells me she has Medicaid. I asked her to call her pharmacy and ask if Medicaid would cover it. She did and it does cover. Discussed with Dr. Artis Flock and he verbally changed the order to Metrogel 1 applicator full Q HS x 5 nights.  I called [facility] pharmacist @ [phone] and changed the order as noted. Melinda Jimenez 02/19/2012

## 2012-02-20 ENCOUNTER — Other Ambulatory Visit: Payer: Self-pay | Admitting: Family Medicine

## 2012-02-20 DIAGNOSIS — N63 Unspecified lump in unspecified breast: Secondary | ICD-10-CM

## 2012-02-21 ENCOUNTER — Other Ambulatory Visit: Payer: Self-pay

## 2012-02-21 ENCOUNTER — Other Ambulatory Visit (HOSPITAL_COMMUNITY)
Admission: RE | Admit: 2012-02-21 | Discharge: 2012-02-21 | Disposition: A | Discharge status: Home / Self Care | Payer: Medicaid Other | Source: Ambulatory Visit | Attending: Family Medicine | Admitting: Family Medicine

## 2012-02-21 DIAGNOSIS — Z01419 Encounter for gynecological examination (general) (routine) without abnormal findings: Secondary | ICD-10-CM | POA: Insufficient documentation

## 2012-02-27 ENCOUNTER — Other Ambulatory Visit: Payer: Medicaid Other

## 2012-02-28 ENCOUNTER — Other Ambulatory Visit (HOSPITAL_COMMUNITY): Payer: Medicaid Other

## 2012-02-28 ENCOUNTER — Inpatient Hospital Stay (HOSPITAL_COMMUNITY): Admission: RE | Admit: 2012-02-28 | Payer: Medicaid Other | Source: Ambulatory Visit

## 2012-02-29 ENCOUNTER — Inpatient Hospital Stay: Admission: RE | Admit: 2012-02-29 | Payer: Medicaid Other | Source: Ambulatory Visit

## 2012-03-11 ENCOUNTER — Encounter (HOSPITAL_COMMUNITY): Payer: Self-pay | Admitting: Emergency Medicine

## 2012-03-11 ENCOUNTER — Emergency Department (HOSPITAL_COMMUNITY)
Admission: EM | Admit: 2012-03-11 | Discharge: 2012-03-11 | Disposition: A | Discharge status: Home / Self Care | Payer: Medicaid Other | Attending: Emergency Medicine | Admitting: Emergency Medicine

## 2012-03-11 DIAGNOSIS — N61 Mastitis without abscess: Secondary | ICD-10-CM | POA: Insufficient documentation

## 2012-03-11 DIAGNOSIS — E079 Disorder of thyroid, unspecified: Secondary | ICD-10-CM | POA: Insufficient documentation

## 2012-03-11 DIAGNOSIS — Z79899 Other long term (current) drug therapy: Secondary | ICD-10-CM | POA: Insufficient documentation

## 2012-03-11 LAB — CBC
HCT: 34.8 % — ABNORMAL LOW (ref 36.0–46.0)
Hemoglobin: 11.4 g/dL — ABNORMAL LOW (ref 12.0–15.0)
MCV: 73.3 fL — ABNORMAL LOW (ref 78.0–100.0)
RBC: 4.75 MIL/uL (ref 3.87–5.11)
WBC: 16.1 10*3/uL — ABNORMAL HIGH (ref 4.0–10.5)

## 2012-03-11 LAB — DIFFERENTIAL
Basophils Absolute: 0 10*3/uL (ref 0.0–0.1)
Basophils Relative: 0 % (ref 0–1)
Lymphocytes Relative: 16 % (ref 12–46)
Lymphs Abs: 2.6 10*3/uL (ref 0.7–4.0)
Monocytes Absolute: 0.8 10*3/uL (ref 0.1–1.0)
Monocytes Relative: 5 % (ref 3–12)
Neutro Abs: 12.7 10*3/uL — ABNORMAL HIGH (ref 1.7–7.7)
Neutrophils Relative %: 79 % — ABNORMAL HIGH (ref 43–77)

## 2012-03-11 LAB — BASIC METABOLIC PANEL
BUN: 13 mg/dL (ref 6–23)
CO2: 23 mEq/L (ref 19–32)
Chloride: 101 mEq/L (ref 96–112)
Creatinine, Ser: 0.63 mg/dL (ref 0.50–1.10)
GFR calc Af Amer: >90 mL/min (ref >90)
Potassium: 3.9 mEq/L (ref 3.5–5.1)

## 2012-03-11 MED ORDER — ONDANSETRON HCL 4 MG/2ML IJ SOLN
4.0000 mg | Freq: Once | INTRAMUSCULAR | Status: DC
  Filled 2012-03-11: qty 2

## 2012-03-11 MED ORDER — OXYCODONE-ACETAMINOPHEN 5-325 MG PO TABS: 1.0000 | ORAL_TABLET | Freq: Four times a day (QID) | ORAL | Status: DC | PRN

## 2012-03-11 MED ORDER — DIPHENHYDRAMINE HCL 50 MG/ML IJ SOLN
INTRAMUSCULAR | Status: AC
  Administered 2012-03-11: 25 mg via INTRAVENOUS
  Filled 2012-03-11: qty 1

## 2012-03-11 MED ORDER — MORPHINE SULFATE 4 MG/ML IJ SOLN
4.0000 mg | Freq: Once | INTRAMUSCULAR | Status: AC
  Administered 2012-03-11: 4 mg via INTRAVENOUS
  Filled 2012-03-11: qty 1

## 2012-03-11 MED ORDER — SULFAMETHOXAZOLE-TRIMETHOPRIM 800-160 MG PO TABS: 1.0000 | ORAL_TABLET | Freq: Two times a day (BID) | ORAL | Status: AC

## 2012-03-11 MED ORDER — VANCOMYCIN HCL IN DEXTROSE 1-5 GM/200ML-% IV SOLN
1000.0000 mg | Freq: Once | INTRAVENOUS | Status: AC
  Administered 2012-03-11: 1000 mg via INTRAVENOUS
  Filled 2012-03-11: qty 200

## 2012-03-11 NOTE — ED Notes (Signed)
Pt c/o HA that started today that is frontal in nature; pt sts pain in right breast and swelling; pt sts was treated for infection and was to follow up with breast center but did not go to appointment; pt sts redness and swelling has returned

## 2012-03-11 NOTE — ED Notes (Signed)
Pt reports onset of headache since this am and right breast pain. States breast is swollen, tender. Denies discharge from nipple. States in too much pain last week to go to appt. Pt given antibiotics for infection in breast. States finished course of antibiotics. Also reports lower back pain, pain to bilateral legs/feet.

## 2012-03-11 NOTE — Discharge Instructions (Signed)
Antibiotic twice a day for 10 day. Keep very clean. Return if worse. Medication for pain

## 2012-03-11 NOTE — ED Provider Notes (Signed)
History     CSN: [id number]  Arrival date & time 03/11/12  4098   First MD Initiated Contact with Patient 03/11/12 1011      Chief Complaint  Patient presents with  . Headache  . Breast Pain    (Consider location/radiation/quality/duration/timing/severity/associated sxs/prior treatment) HPI... redness and swelling in right breast for several days. History of breast cellulitis in the past. No fever or chills. No radiation. Symptoms are mild to moderate. No chest pain or shortness of breath  Past Medical History  Diagnosis Date  . Genital HSV   . Anxiety   . Thyroid disease   . Panic attack   . Mood swings     Past Surgical History  Procedure Date  . Induced abortion 2009  . Vaginal delivery 2007  . Breast surgery     Family History  Problem Relation Age of Onset  . Breast cancer Other     History  Substance Use Topics  . Smoking status: Never Smoker   . Smokeless tobacco: Not on file  . Alcohol Use: Yes     occas    OB History    Grav Para Term Preterm Abortions TAB SAB Ect Mult Living                  Review of Systems  All other systems reviewed and are negative.    Allergies  Zofran  Home Medications   Current Outpatient Rx  Name Route Sig Dispense Refill  . ALPRAZOLAM 0.5 MG PO TABS Oral Take 0.5 mg by mouth daily as needed. For anxiety    . DIVALPROEX SODIUM ER 250 MG PO TB24 Oral Take 250 mg by mouth at bedtime.    Marland Kitchen FAMOTIDINE 20 MG PO TABS Oral Take 1 tablet (20 mg total) by mouth 2 (two) times daily. 30 tablet 0  . LORAZEPAM 0.5 MG PO TABS Oral Take 0.5 mg by mouth daily as needed.    Marland Kitchen VALACYCLOVIR HCL 1 G PO TABS Oral Take 500 mg by mouth daily as needed. For outbreaks    . OXYCODONE-ACETAMINOPHEN 5-325 MG PO TABS Oral Take 1-2 tablets by mouth every 6 (six) hours as needed for pain. 15 tablet 0  . SULFAMETHOXAZOLE-TRIMETHOPRIM 800-160 MG PO TABS Oral Take 1 tablet by mouth every 12 (twelve) hours. 20 tablet 0    BP 130/79  Pulse 89   Temp(Src) 98.5 F (36.9 C) (Oral)  Resp 22  SpO2 96%  Physical Exam  Nursing note and vitals reviewed. Constitutional: She is oriented to person, place, and time. She appears well-developed and well-nourished.  HENT:  Head: Normocephalic and atraumatic.  Eyes: Conjunctivae and EOM are normal. Pupils are equal, round, and reactive to light.  Neck: Normal range of motion. Neck supple.  Cardiovascular: Normal rate and regular rhythm.   Pulmonary/Chest: Effort normal and breath sounds normal.  Abdominal: Soft. Bowel sounds are normal.  Musculoskeletal: Normal range of motion.  Neurological: She is alert and oriented to person, place, and time.  Skin:       Right breast: Area of erythema superior medial aspect of breast approximately 7 x 7 cm in diameter  Psychiatric: She has a normal mood and affect.    ED Course  Procedures (including critical care time)  Labs Reviewed  CBC - Abnormal; Notable for the following:    WBC 16.1 (*)    Hemoglobin 11.4 (*)    HCT 34.8 (*)    MCV 73.3 (*)    [facility]  24.0 (*)    RDW 16.4 (*)    All other components within normal limits  DIFFERENTIAL - Abnormal; Notable for the following:    Neutrophils Relative 79 (*)    Neutro Abs 12.7 (*)    All other components within normal limits  BASIC METABOLIC PANEL   No results found.   1. Cellulitis of breast       MDM  History and physical consistent with cellulitis of breath. IV vancomycin ED. Discharge home with Septra DS for 10 days.        Donnetta Hutching, MD 03/11/12 1434

## 2012-03-18 ENCOUNTER — Ambulatory Visit
Admission: RE | Admit: 2012-03-18 | Discharge: 2012-03-18 | Disposition: A | Discharge status: Home / Self Care | Payer: Medicaid Other | Source: Ambulatory Visit | Attending: Family Medicine | Admitting: Family Medicine

## 2012-03-18 DIAGNOSIS — N63 Unspecified lump in unspecified breast: Secondary | ICD-10-CM

## 2012-03-19 ENCOUNTER — Inpatient Hospital Stay (HOSPITAL_COMMUNITY): Payer: Medicaid Other

## 2012-03-19 ENCOUNTER — Inpatient Hospital Stay (HOSPITAL_COMMUNITY)
Admission: AD | Admit: 2012-03-19 | Discharge: 2012-03-19 | Disposition: A | Discharge status: Home / Self Care | Payer: Medicaid Other | Source: Ambulatory Visit | Attending: Obstetrics & Gynecology | Admitting: Obstetrics & Gynecology

## 2012-03-19 ENCOUNTER — Encounter (HOSPITAL_COMMUNITY): Payer: Self-pay | Admitting: *Deleted

## 2012-03-19 DIAGNOSIS — O26899 Other specified pregnancy related conditions, unspecified trimester: Secondary | ICD-10-CM

## 2012-03-19 DIAGNOSIS — R109 Unspecified abdominal pain: Secondary | ICD-10-CM | POA: Insufficient documentation

## 2012-03-19 DIAGNOSIS — Z3201 Encounter for pregnancy test, result positive: Secondary | ICD-10-CM | POA: Insufficient documentation

## 2012-03-19 LAB — CBC
Hemoglobin: 11 g/dL — ABNORMAL LOW (ref 12.0–15.0)
Platelets: 388 10*3/uL (ref 150–400)
RBC: 4.56 MIL/uL (ref 3.87–5.11)
RDW: 17.3 % — ABNORMAL HIGH (ref 11.5–15.5)
WBC: 11.6 10*3/uL — ABNORMAL HIGH (ref 4.0–10.5)

## 2012-03-19 LAB — ABO/RH: ABO/RH(D): A POS

## 2012-03-19 LAB — URINALYSIS, ROUTINE W REFLEX MICROSCOPIC
Bilirubin Urine: NEGATIVE
Glucose, UA: NEGATIVE mg/dL
Ketones, ur: NEGATIVE mg/dL
Leukocytes, UA: NEGATIVE
Nitrite: NEGATIVE
Specific Gravity, Urine: 1.025 (ref 1.005–1.030)
Urobilinogen, UA: 0.2 mg/dL (ref 0.0–1.0)
pH: 6 (ref 5.0–8.0)

## 2012-03-19 LAB — POCT PREGNANCY, URINE: Preg Test, Ur: POSITIVE — AB

## 2012-03-19 LAB — HCG, QUANTITATIVE, PREGNANCY: hCG, Beta Chain, Quant, S: 211 m[IU]/mL — ABNORMAL HIGH (ref <5)

## 2012-03-19 NOTE — MAU Note (Signed)
Pt presents with complaint of no LMP since 02/01/2012, had positive preg test at home. Nausea at times.

## 2012-03-19 NOTE — MAU Provider Note (Signed)
Chief Complaint:  Possible Pregnancy    First Provider Initiated Contact with Patient 03/19/12 2102      Melinda Jimenez is  26 y.o. [id number].  Patient's last menstrual period was 02/01/2012..   She presents complaining of Possible Pregnancy  Reports + UPT at home today. States lower left sided abd cramping x 2 days. Denies vaginal bleeding, discharge, dysuria, back pain, nausea, vomiting, fever, chills, or diarrhea.  Obstetrical/Gynecological History: OB History    Grav Para Term Preterm Abortions TAB SAB Ect Mult Living   5 2 2  2 1 1   1       Past Medical History: Past Medical History  Diagnosis Date  . Genital HSV   . Anxiety   . Thyroid disease   . Panic attack   . Mood swings     Past Surgical History: Past Surgical History  Procedure Date  . Induced abortion 2009  . Vaginal delivery 2007  . Breast surgery     Family History: Family History  Problem Relation Age of Onset  . Breast cancer Other     Social History: History  Substance Use Topics  . Smoking status: Never Smoker   . Smokeless tobacco: Not on file  . Alcohol Use: Yes     occas    Allergies:  Allergies  Allergen Reactions  . Zofran (Ondansetron Hcl) Nausea And Vomiting    Prescriptions prior to admission  Medication Sig Dispense Refill  . ALPRAZolam (XANAX) 0.5 MG tablet Take 0.5 mg by mouth daily as needed. For anxiety      . divalproex (DEPAKOTE ER) 250 MG 24 hr tablet Take 250 mg by mouth at bedtime.      . famotidine (PEPCID) 20 MG tablet Take 1 tablet (20 mg total) by mouth 2 (two) times daily.  30 tablet  0  . LORazepam (ATIVAN) 0.5 MG tablet Take 0.5 mg by mouth daily as needed.      Marland Kitchen oxyCODONE-acetaminophen (PERCOCET) 5-325 MG per tablet Take 1-2 tablets by mouth every 6 (six) hours as needed for pain.  15 tablet  0  . sulfamethoxazole-trimethoprim (SEPTRA DS) 800-160 MG per tablet Take 1 tablet by mouth every 12 (twelve) hours.  20 tablet  0  . valACYclovir (VALTREX) 1000 MG  tablet Take 500 mg by mouth daily as needed. For outbreaks        Review of Systems - Breast ROS: negative for - new or changing breast lumps or nipple changes Respiratory ROS: no cough, shortness of breath, or wheezing Cardiovascular ROS: no chest pain or dyspnea on exertion Gastrointestinal ROS: positive for - LLQ cramping Genito-Urinary ROS: no dysuria, trouble voiding, or hematuria negative for - vulvar/vaginal symptoms Neurological ROS: no TIA or stroke symptoms negative for - headaches  Physical Exam   Blood pressure 124/83, pulse 109, temperature 98.7 F (37.1 C), temperature source Oral, resp. rate 18, height 5\' 8"  (1.727 m), weight 226 lb (102.513 kg), last menstrual period 02/01/2012, SpO2 100.00%.  General: General appearance - alert, well appearing, and in no distress, oriented to person, place, and time and overweight Mental status - alert, oriented to person, place, and time, normal mood, behavior, speech, dress, motor activity, and thought processes, annoyed Abdomen - soft, nontender, nondistended, no masses or organomegaly no CVA tenderness obese Focused Gynecological Exam: exam declined by the patient  Labs: Recent Results (from the past 24 hour(s))  POCT PREGNANCY, URINE   Collection Time   03/19/12  8:43 PM  Component Value Range   Preg Test, Ur POSITIVE (*) NEGATIVE   Imaging Studies:     Assessment:  1. Positive pregnancy test   2. Abdominal pain in pregnancy      Plan: Discharge home Pregnancy Precautions FU Friday for repeat quant  Romano Stigger E. 03/19/2012,9:10 PM

## 2012-03-19 NOTE — Discharge Instructions (Signed)
Abdominal Pain During Pregnancy  Abdominal discomfort is common in pregnancy. Most of the time, it does not cause harm. There are many causes of abdominal pain. Some causes are more serious than others. Some of the causes of abdominal pain in pregnancy are easily diagnosed. Occasionally, the diagnosis takes time to understand. Other times, the cause is not determined. Abdominal pain can be a sign that something is very wrong with the pregnancy, or the pain may have nothing to do with the pregnancy at all. For this reason, always tell your caregiver if you have any abdominal discomfort.  CAUSES  Common and harmless causes of abdominal pain include:   Constipation.   Excess gas and bloating.   Round ligament pain. This is pain that is felt in the folds of the groin.   The position the baby or placenta is in.   Baby kicks.   Braxton-Hicks contractions. These are mild contractions that do not cause cervical dilation.  Serious causes of abdominal pain include:   Ectopic pregnancy. This happens when a fertilized egg implants outside of the uterus.   Miscarriage.   Preterm labor. This is when labor starts at less than 37 weeks of pregnancy.   Placental abruption. This is when the placenta partially or completely separates from the uterus.   Preeclampsia. This is often associated with high blood pressure and has been referred to as "toxemia in pregnancy."   Uterine or amniotic fluid infections.  Causes unrelated to pregnancy include:   Urinary tract infection.   Gallbladder stones or inflammation.   Hepatitis or other liver illness.   Intestinal problems, stomach flu, food poisoning, or ulcer.   Appendicitis.   Kidney (renal) stones.   Kidney infection (pylonephritis).  HOME CARE INSTRUCTIONS   For mild pain:   Do not have sexual intercourse or put anything in your vagina until your symptoms go away completely.   Get plenty of rest until your pain improves. If your pain does not improve in 1 hour, call  your caregiver.   Drink clear fluids if you feel nauseous. Avoid solid food as long as you are uncomfortable or nauseous.   Only take medicine as directed by your caregiver.   Keep all follow-up appointments with your caregiver.  SEEK IMMEDIATE MEDICAL CARE IF:   You are bleeding, leaking fluid, or passing tissue from the vagina.   You have increasing pain or cramping.   You have persistent vomiting.   You have painful or bloody urination.   You have a fever.   You notice a decrease in your baby's movements.   You have extreme weakness or feel faint.   You have shortness of breath, with or without abdominal pain.   You develop a severe headache with abdominal pain.   You have abnormal vaginal discharge with abdominal pain.   You have persistent diarrhea.   You have abdominal pain that continues even after rest, or gets worse.  MAKE SURE YOU:    Understand these instructions.   Will watch your condition.   Will get help right away if you are not doing well or get worse.  Document Released: 09/17/2005 Document Revised: 09/06/2011 Document Reviewed: 04/13/2011  [employer] Patient Information 2012 [employer].

## 2012-03-21 ENCOUNTER — Inpatient Hospital Stay (HOSPITAL_COMMUNITY)
Admission: AD | Admit: 2012-03-21 | Discharge: 2012-03-21 | Disposition: A | Discharge status: Home / Self Care | Payer: Medicaid Other | Source: Ambulatory Visit | Attending: Obstetrics and Gynecology | Admitting: Obstetrics and Gynecology

## 2012-03-21 ENCOUNTER — Inpatient Hospital Stay (HOSPITAL_COMMUNITY): Payer: Medicaid Other

## 2012-03-21 ENCOUNTER — Ambulatory Visit (HOSPITAL_COMMUNITY): Payer: Medicaid Other

## 2012-03-21 DIAGNOSIS — R1032 Left lower quadrant pain: Secondary | ICD-10-CM | POA: Insufficient documentation

## 2012-03-21 DIAGNOSIS — O34599 Maternal care for other abnormalities of gravid uterus, unspecified trimester: Secondary | ICD-10-CM | POA: Insufficient documentation

## 2012-03-21 DIAGNOSIS — N831 Corpus luteum cyst of ovary, unspecified side: Secondary | ICD-10-CM

## 2012-03-21 DIAGNOSIS — O26899 Other specified pregnancy related conditions, unspecified trimester: Secondary | ICD-10-CM

## 2012-03-21 NOTE — MAU Note (Signed)
Patient to MAU for repeat BHCG. Patient states she has some cramping on and off but no bleeding.

## 2012-03-21 NOTE — MAU Provider Note (Signed)
History   Chief Complaint:  Follow-up   First Provider Initiated Contact with Patient 03/21/12 1718     Melinda Jimenez is  26 y.o. [id number] Patient's last menstrual period was 02/01/2012.Marland Kitchen Patient is here for follow up of quantitative HCG and ongoing surveillance of pregnancy status.   She is [redacted]w[redacted]d weeks gestation  by LMP.    Since her last visit, the patient is with new complaint.   The patient reports bleeding as  none now, but LLQ pain has worsened since last visit two days ago enough that she cannot sit in a chair strait. Denies fever, chills, passage if tissue, urinary complaints, GI complaints.  General ROS:  negative  Her previous Quantitative HCG values are: 03/19/12: 211    Physical Exam   Blood pressure 126/78, pulse 103, temperature 98.4 F (36.9 C), temperature source Oral, resp. rate 18, height 5\' 8"  (1.727 m), weight 103.42 kg (228 lb), last menstrual period 02/01/2012, SpO2 100.00%.  Focused Gynecological Exam: examination not indicated  Labs: Recent Results (from the past 24 hour(s))  HCG, QUANTITATIVE, PREGNANCY   Collection Time   03/21/12  2:45 PM      Component Value Range   hCG, Beta Chain, Quant, S 422 (*) <5 mIU/mL    Ultrasound Studies:   US Breast Right  03/18/2012  *RADIOLOGY REPORT*  Clinical Data:  The patient has been on antibiotics for mastitis right breast.  Palpable lump 9-12 o'clock area.  RIGHT BREAST ULTRASOUND  Comparison:  Feb 04, 2012  On physical exam, I do not palpate a mass.  There is no skin thickening or redness.  Findings: Ultrasound is performed, showing two small simple cysts in the right breast 12 o'clock subareolar region, larger one measures 0.5 cm.  No other focal discrete cystic or solid lesion is identified from the right breast 9 o'clock to right breast 12 o'clock positions.  IMPRESSION: Benign findings.  RECOMMENDATION: Routine screening mammogram at age 73.  BI-RADS CATEGORY 2:  Benign finding(s).  Original Report Authenticated By:  Sherian Rein, M.D.   US Ob Comp Less 14 Wks  03/19/2012  [place]: No IUP or adnexal masses seen.    US Ob Transvaginal  03/21/2012  *RADIOLOGY REPORT*  Clinical Data: Pelvic pain.  Pregnant.  OBSTETRIC <14 WK ULTRASOUND  Technique:  Transvaginal ultrasound was performed for evaluation of the gestation as well as the maternal uterus and adnexal regions.  Comparison:  03/19/2012.  Intrauterine gestational sac: Single. Yolk sac: None. Embryo: None. Cardiac Activity: N/A Heart Rate: N/A bpm  MSD: 0.23 mm  fourw  sixd CRL:   mm  w  d         [place] EDC: 11/23/2012.  Maternal uterus/Adnexae: Retroverted uterus. No subchorionic hemorrhage. Normal right ovary. Normal left ovary.  Corpus luteum cyst noted. No free pelvic fluid collections.  IMPRESSION: Single intrauterine gestational sac estimated 4 weeks and 6 days cessation.  No yolk sac or embryo.  Original Report Authenticated By: P. Loralie Champagne, M.D.   Assessment: Early pregnancy w/ appropriately rising quants and possible GS 1. Corpus luteum cyst    Plan: The patient is instructed to follow up in in 2 days for quant and in 10 days for [place] SAB and ectopic precautions.  Comfort measures  Melinda Jimenez, Melinda Jimenez 03/21/2012, 5:14 PM

## 2012-03-21 NOTE — Discharge Instructions (Signed)
You may take extra Strength Tylenol for your pain and warm baths or compresses.  Depakote and Xanax are category D medications in pregnancy and may cause harm to your baby. Consider weaning off of these with the assistance of your doctor if you are taking them for anxiety. Do not stop abruptly due to the risk of seizures.

## 2012-03-23 ENCOUNTER — Inpatient Hospital Stay (HOSPITAL_COMMUNITY)
Admission: AD | Admit: 2012-03-23 | Discharge: 2012-03-23 | Disposition: A | Discharge status: Home / Self Care | Payer: Medicaid Other | Source: Ambulatory Visit | Attending: Obstetrics & Gynecology | Admitting: Obstetrics & Gynecology

## 2012-03-23 DIAGNOSIS — O99891 Other specified diseases and conditions complicating pregnancy: Secondary | ICD-10-CM | POA: Insufficient documentation

## 2012-03-23 DIAGNOSIS — Z3201 Encounter for pregnancy test, result positive: Secondary | ICD-10-CM

## 2012-03-23 NOTE — MAU Provider Note (Signed)
Medical Screening exam and patient care preformed by advanced practice provider.  Agree with the above management.  

## 2012-03-23 NOTE — ED Notes (Signed)
Results and POC discussed with pt by S.Shores,CNM

## 2012-03-23 NOTE — MAU Provider Note (Signed)
History   Chief Complaint:  Follow-up   Melinda Jimenez is  26 y.o. [id number] Patient's last menstrual period was 02/01/2012.Marland Kitchen Patient is here for follow up of quantitative HCG and ongoing surveillance of pregnancy status.     Since her last visit, the patient is without new complaint.   The patient reports bleeding as  none now.    General ROS:  negative  Her previous Quantitative HCG values are: 6/19: 211, 6/23: 422    Physical Exam   Blood pressure 120/66, pulse 109, temperature 98.5 F (36.9 C), resp. rate 18, height 5\' 8"  (1.727 m), weight 229 lb 9.6 oz (104.146 kg), last menstrual period 02/01/2012.  Focused Gynecological Exam: examination not indicated  Labs: Recent Results (from the past 24 hour(s))  HCG, QUANTITATIVE, PREGNANCY   Collection Time   03/23/12  3:20 PM      Component Value Range   hCG, Beta Chain, Quant, S 948 (*) <5 mIU/mL    Ultrasound Studies:    US Ob Transvaginal  03/21/2012  *RADIOLOGY REPORT*  Clinical Data: Pelvic pain.  Pregnant.  OBSTETRIC <14 WK ULTRASOUND  Technique:  Transvaginal ultrasound was performed for evaluation of the gestation as well as the maternal uterus and adnexal regions.  Comparison:  03/19/2012.  Intrauterine gestational sac: Single. Yolk sac: None. Embryo: None. Cardiac Activity: N/A Heart Rate: N/A bpm  MSD: 0.23 mm  fourw  sixd CRL:   mm  w  d         [place] EDC: 11/23/2012.  Maternal uterus/Adnexae: Retroverted uterus. No subchorionic hemorrhage. Normal right ovary. Normal left ovary.  Corpus luteum cyst noted. No free pelvic fluid collections.  IMPRESSION: Single intrauterine gestational sac estimated 4 weeks and 6 days cessation.  No yolk sac or embryo.  Original Report Authenticated By: P. Loralie Champagne, M.D.    Assessment: Positive Preg Test with appropriately rising quants   Plan: Discharge home with precautions  FU as scheduled for repeat US  Dakotah Heiman E. 03/23/2012, 4:26 PM

## 2012-03-23 NOTE — MAU Note (Signed)
Pt denies vaginal bleeding but had a lot of diarrhea this morning no pain now.

## 2012-03-23 NOTE — Discharge Instructions (Signed)
Human Chorionic Gonadotropin (hCG) This is a test to confirm and monitor pregnancy or to diagnose trophoblastic disease or germ cell tumors. As early as 10 days after a missed menstrual period (some methods can detect hCG even earlier, at one week after conception) or if your caregiver thinks that your symptoms suggest ectopic pregnancy, a failing pregnancy, trophoblastic disease, or germ cell tumors. hCG is a protein produced in the placenta of a pregnant woman. A pregnancy test is a specific blood or urine test that can detect hCG and confirm pregnancy. This hormone is able to be detected 10 days after a missed menstrual period, the time period when the fertilized egg is implanted in the woman's uterus. With some methods, hCG can be detected even earlier, at one week after conception.  During the early weeks of pregnancy, hCG is important in maintaining function of the corpus luteum (the mass of cells that forms from a mature egg). Production of hCG increases steadily during the first trimester (8-10 weeks), peaking around the 10th week after the last menstrual cycle. Levels then fall slowly during the remainder of the pregnancy. hCG is no longer detectable within a few weeks after delivery. hCG is also produced by some germ cell tumors and increased levels are seen in trophoblastic disease. SAMPLE COLLECTION hCG is commonly detected in urine. The preferred specimen is a random urine sample collected first thing in the morning. hCG can also be measured in blood drawn from a vein in the arm. NORMAL FINDINGS Qualitative:negative in non-pregnant women; positive in pregnancy Quantitative:   Gestation less than 1 week: 5-50 Whole HCG (milli-international units/mL)   Gestation of 2 weeks: 50-500 Whole HCG (milli-international units/mL)   Gestation of 3 weeks: 100-10,000 Whole HCG (milli-international units/mL)   Gestation of 4 weeks: 1,000-30,000 Whole HCG (milli-international units/mL)   Gestation of  5 weeks 3,500-115,000 Whole HCG (milli-international units/mL)   Gestation of 6-8 weeks: 12,000-270,000 Whole HCG (milli-international units/mL)   Gestation of 12 weeks: 15,000-220,000 Whole HCG (milli-international units/mL)   Males and non-pregnant females: less than 5 Whole HCG (milli-international units/mL)  Beta subunit: depends on the method and test used Ranges for normal findings may vary among different laboratories and hospitals. You should always check with your doctor after having lab work or other tests done to discuss the meaning of your test results and whether your values are considered within normal limits. MEANING OF TEST  Your caregiver will go over the test results with you and discuss the importance and meaning of your results, as well as treatment options and the need for additional tests if necessary. OBTAINING THE TEST RESULTS It is your responsibility to obtain your test results. Ask the lab or department performing the test when and how you will get your results. Document Released: 10/19/2004 Document Revised: 09/06/2011 Document Reviewed: 08/28/2008 [facility] Patient Information 2012 [place], [place].Vaginal Bleeding During Pregnancy, First Trimester A small amount of bleeding (spotting) is relatively common in early pregnancy. It usually stops on its own. There are many causes for bleeding or spotting in early pregnancy. Some bleeding may be related to the pregnancy and some may not. Cramping with the bleeding is more serious and concerning. Tell your caregiver if you have any vaginal bleeding.  CAUSES   It is normal in most cases.   The pregnancy ends (miscarriage).   The pregnancy may end (threatened miscarriage).   Infection or inflammation of the cervix.   Growths (polyps) on the cervix.   Pregnancy happens outside of  the uterus and in a fallopian tube (tubal pregnancy).   Many tiny cysts in the uterus instead of pregnancy tissue (molar pregnancy).    SYMPTOMS  Vaginal bleeding or spotting with or without cramps. DIAGNOSIS  To evaluate the pregnancy, your caregiver may:  Do a pelvic exam.   Take blood tests.   Do an ultrasound.  It is very important to follow your caregiver's instructions.  TREATMENT   Evaluation of the pregnancy with blood tests and ultrasound.   Bed rest (getting up to use the bathroom only).   Rho-gam immunization if the mother is Rh negative and the father is Rh positive.  HOME CARE INSTRUCTIONS   If your caregiver orders bed rest, you may need to make arrangements for the care of other children and for other responsibilities. However, your caregiver may allow you to continue light activity.   Keep track of the number of pads you use each day, how often you change pads and how soaked (saturated) they are. Write this down.   Do not use tampons. Do not douche.   Do not have sexual intercourse or orgasms until approved by your physician.   Save any tissue that you pass for your caregiver to see.   Take medicine for cramps only with your caregiver's permission.   Do not take aspirin because it can make you bleed.  SEEK IMMEDIATE MEDICAL CARE IF:   You experience severe cramps in your stomach, back or belly (abdomen).   You have an oral temperature above 102 F (38.9 C), not controlled by medicine.   You pass large clots or tissue.   Your bleeding increases or you become light-headed, weak or have fainting episodes.   You develop chills.   You are leaking or have a gush of fluid from your vagina.   You pass out while having a bowel movement. That may mean you have a ruptured tubal pregnancy.  Document Released: 06/27/2005 Document Revised: 09/06/2011 Document Reviewed: 01/06/2009 [facility] Patient Information 2012 [place], [place].

## 2012-03-25 NOTE — MAU Provider Note (Signed)
Agree with above note.  Melinda Jimenez 03/25/2012 7:32 AM

## 2012-03-31 ENCOUNTER — Other Ambulatory Visit: Payer: Self-pay | Admitting: Obstetrics and Gynecology

## 2012-03-31 ENCOUNTER — Encounter: Payer: Self-pay | Admitting: Obstetrics and Gynecology

## 2012-03-31 ENCOUNTER — Ambulatory Visit (HOSPITAL_COMMUNITY)
Admission: RE | Admit: 2012-03-31 | Discharge: 2012-03-31 | Disposition: A | Discharge status: Home / Self Care | Payer: Medicaid Other | Source: Ambulatory Visit | Attending: Advanced Practice Midwife | Admitting: Advanced Practice Midwife

## 2012-03-31 DIAGNOSIS — O99891 Other specified diseases and conditions complicating pregnancy: Secondary | ICD-10-CM | POA: Insufficient documentation

## 2012-03-31 DIAGNOSIS — N831 Corpus luteum cyst of ovary, unspecified side: Secondary | ICD-10-CM

## 2012-03-31 DIAGNOSIS — Z3480 Encounter for supervision of other normal pregnancy, unspecified trimester: Secondary | ICD-10-CM

## 2012-03-31 DIAGNOSIS — R109 Unspecified abdominal pain: Secondary | ICD-10-CM

## 2012-03-31 DIAGNOSIS — Z3689 Encounter for other specified antenatal screening: Secondary | ICD-10-CM | POA: Insufficient documentation

## 2012-03-31 MED ORDER — CONCEPT OB 130-92.4-1 MG PO CAPS: 1.0000 | ORAL_CAPSULE | ORAL | Status: DC

## 2012-04-06 ENCOUNTER — Encounter (HOSPITAL_COMMUNITY): Payer: Self-pay | Admitting: Advanced Practice Midwife

## 2012-04-28 ENCOUNTER — Encounter (HOSPITAL_COMMUNITY): Payer: Self-pay | Admitting: *Deleted

## 2012-04-28 ENCOUNTER — Inpatient Hospital Stay (HOSPITAL_COMMUNITY)
Admission: AD | Admit: 2012-04-28 | Discharge: 2012-04-29 | Disposition: A | Discharge status: Hospice | Payer: Medicaid Other | Source: Ambulatory Visit | Attending: Family Medicine | Admitting: Family Medicine

## 2012-04-28 DIAGNOSIS — O219 Vomiting of pregnancy, unspecified: Secondary | ICD-10-CM

## 2012-04-28 DIAGNOSIS — O210 Mild hyperemesis gravidarum: Secondary | ICD-10-CM | POA: Insufficient documentation

## 2012-04-28 DIAGNOSIS — R51 Headache: Secondary | ICD-10-CM

## 2012-04-28 DIAGNOSIS — O99891 Other specified diseases and conditions complicating pregnancy: Secondary | ICD-10-CM | POA: Insufficient documentation

## 2012-04-28 HISTORY — DX: Headache: R51

## 2012-04-28 LAB — URINALYSIS, ROUTINE W REFLEX MICROSCOPIC
Glucose, UA: NEGATIVE mg/dL
Leukocytes, UA: NEGATIVE
Protein, ur: NEGATIVE mg/dL
Specific Gravity, Urine: >1.03 — ABNORMAL HIGH (ref 1.005–1.030)
pH: 6 (ref 5.0–8.0)

## 2012-04-28 MED ORDER — SODIUM CHLORIDE 0.9 % IV SOLN
25.0000 mg | Freq: Once | INTRAVENOUS | Status: AC
  Administered 2012-04-28: 25 mg via INTRAVENOUS
  Filled 2012-04-28: qty 1

## 2012-04-28 MED ORDER — DEXAMETHASONE SODIUM PHOSPHATE 10 MG/ML IJ SOLN
10.0000 mg | Freq: Once | INTRAMUSCULAR | Status: AC
  Administered 2012-04-28: 10 mg via INTRAVENOUS
  Filled 2012-04-28: qty 1

## 2012-04-28 NOTE — MAU Note (Signed)
PT SAYS THIS AM HAD CRAMPS-    THEN HAS BEEN  HAVING SIDE PAIN-  THEN SHOULDERS HURT.  VOMITED   X4 TODAY.   PLANS TO GO TO  [place] [facility].-  APPPOINTMENT-   IS TOMORROW- AT 1030AM..  WHEN SHE WAS HERE - HAD MANY LABS AND  U/S.

## 2012-04-28 NOTE — MAU Provider Note (Signed)
Chief Complaint:  Headache and Emesis    First Provider Initiated Contact with Patient 04/28/12 2241      Melinda Jimenez is  26 y.o. [id number].  Patient's last menstrual period was 02/01/2012.Marland Kitchen [redacted]w[redacted]d     She presents complaining of Headache and Emesis  Obstetrical/Gynecological History: OB History    Grav Para Term Preterm Abortions TAB SAB Ect Mult Living   6 2 2  3 1 2   1       Past Medical History: Past Medical History  Diagnosis Date  . Genital HSV   . Anxiety   . Thyroid disease   . Panic attack   . Mood swings   . Headache     Past Surgical History: Past Surgical History  Procedure Date  . Induced abortion 2009  . Vaginal delivery 2007  . Breast surgery     Family History: Family History  Problem Relation Age of Onset  . Breast cancer Other   . Depression Mother   . Anxiety disorder Mother   . Bipolar disorder Brother   . Diabetes Maternal Grandmother   . Cancer Maternal Grandmother   . Hypertension Maternal Grandmother   . Hypertension Paternal Grandmother     Social History: History  Substance Use Topics  . Smoking status: Never Smoker   . Smokeless tobacco: Not on file  . Alcohol Use: Yes     occas    Allergies:  Allergies  Allergen Reactions  . Zofran (Ondansetron Hcl) Nausea And Vomiting    Prescriptions prior to admission  Medication Sig Dispense Refill  . famotidine (PEPCID) 20 MG tablet Take 1 tablet (20 mg total) by mouth 2 (two) times daily.  30 tablet  0  . Prenat w/o A Vit-FeFum-FePo-FA (CONCEPT OB) 130-92.4-1 MG CAPS Take 1 tablet by mouth 1 day or 1 dose.  30 capsule  5  . valACYclovir (VALTREX) 1000 MG tablet Take 500 mg by mouth daily as needed. For outbreaks        Review of Systems - History obtained from the patient Ophthalmic ROS: negative for - blurry vision or photophobia Breast ROS: negative Respiratory ROS: no cough, shortness of breath, or wheezing Cardiovascular ROS: no chest pain or dyspnea on  exertion Gastrointestinal ROS: no abdominal pain, change in bowel habits, or black or bloody stools positive for - nausea/vomiting Genito-Urinary ROS: no dysuria, trouble voiding, or hematuria negative for - genital discharge or vulvar/vaginal symptoms Neurological ROS: positive for - headaches negative for - numbness/tingling, visual changes or weakness  Physical Exam   Blood pressure 111/71, pulse 91, temperature 98.4 F (36.9 C), temperature source Oral, resp. rate 20, height 5\' 7"  (1.702 m), weight 227 lb 4 oz (103.08 kg), last menstrual period 02/01/2012.  General: General appearance - alert, well appearing, and in no distress, oriented to person, place, and time and overweight Mental status - alert, oriented to person, place, and time, normal mood, behavior, speech, dress, motor activity, and thought processes, affect appropriate to mood Eyes - pupils equal and reactive, extraocular eye movements intact Lymphatics - no palpable lymphadenopathy Abdomen - obese, non tender Neurological - alert, oriented, normal speech, no focal findings or movement disorder noted, screening mental status exam normal, cranial nerves II through XII intact, DTR's normal and symmetric, normal muscle tone, no tremors, strength 5/5, Romberg sign negative, normal gait and station Extremities - peripheral pulses normal, no pedal edema, no clubbing or cyanosis Focused Gynecological Exam: examination not indicated  Labs: Recent Results (from the  past 24 hour(s))  URINALYSIS, ROUTINE W REFLEX MICROSCOPIC   Collection Time   04/28/12  9:37 PM      Component Value Range   Color, Urine YELLOW  YELLOW   APPearance CLEAR  CLEAR   Specific Gravity, Urine >1.030 (*) 1.005 - 1.030   pH 6.0  5.0 - 8.0   Glucose, UA NEGATIVE  NEGATIVE mg/dL   Hgb urine dipstick NEGATIVE  NEGATIVE   Bilirubin Urine NEGATIVE  NEGATIVE   Ketones, ur NEGATIVE  NEGATIVE mg/dL   Protein, ur NEGATIVE  NEGATIVE mg/dL   Urobilinogen, UA  0.2  0.0 - 1.0 mg/dL   Nitrite NEGATIVE  NEGATIVE   Leukocytes, UA NEGATIVE  NEGATIVE   Imaging Studies:  Informal bedside US: IUP + fetal movement, + cardiac activity  ED Course: IVF with HA cocktail  12:54 AM Headache resolved   Assessment: 1. Headache   2. Nausea/vomiting in pregnancy    Plan: Discharge home Rx Phenergan and Flexeril prn Referral for [facility]   Harpreet Signore E. 04/28/2012,11:16 PM

## 2012-04-28 NOTE — MAU Note (Signed)
Pt c/o pain in her neck and behind her eyes and forehead, she took tylenol (2) @ 1730 without any relief.  Vomiting every day for the past three weeks.

## 2012-04-29 DIAGNOSIS — R51 Headache: Secondary | ICD-10-CM

## 2012-04-29 DIAGNOSIS — O219 Vomiting of pregnancy, unspecified: Secondary | ICD-10-CM

## 2012-04-29 MED ORDER — CYCLOBENZAPRINE HCL 10 MG PO TABS: 10.0000 mg | ORAL_TABLET | Freq: Three times a day (TID) | ORAL | Status: DC | PRN

## 2012-04-29 MED ORDER — PROMETHAZINE HCL 12.5 MG PO TABS: 12.5000 mg | ORAL_TABLET | Freq: Four times a day (QID) | ORAL | Status: DC | PRN

## 2012-04-29 NOTE — MAU Provider Note (Signed)
Chart reviewed and agree with management and plan.  

## 2012-06-01 ENCOUNTER — Emergency Department (INDEPENDENT_AMBULATORY_CARE_PROVIDER_SITE_OTHER)
Admission: EM | Admit: 2012-06-01 | Discharge: 2012-06-01 | Disposition: A | Discharge status: Home / Self Care | Payer: Medicaid Other | Source: Home / Self Care | Attending: Family Medicine | Admitting: Family Medicine

## 2012-06-01 ENCOUNTER — Encounter (HOSPITAL_COMMUNITY): Payer: Self-pay | Admitting: Emergency Medicine

## 2012-06-01 DIAGNOSIS — N760 Acute vaginitis: Secondary | ICD-10-CM

## 2012-06-01 LAB — POCT URINALYSIS DIP (DEVICE)
Glucose, UA: NEGATIVE mg/dL
Leukocytes, UA: NEGATIVE
Specific Gravity, Urine: 1.02 (ref 1.005–1.030)
Urobilinogen, UA: 1 mg/dL (ref 0.0–1.0)

## 2012-06-01 LAB — POCT PREGNANCY, URINE: Preg Test, Ur: POSITIVE — AB

## 2012-06-01 LAB — WET PREP, GENITAL
Trich, Wet Prep: NONE SEEN
Yeast Wet Prep HPF POC: NONE SEEN

## 2012-06-01 NOTE — ED Notes (Signed)
Pt states that she had an abortion on august 9,2013. Recent intercourse condom broke. C/o vaginal discharge no odor/itching with mild cramping.  Pt request rx for morning after pill

## 2012-06-01 NOTE — ED Provider Notes (Cosign Needed)
History     CSN: [id number]  Arrival date & time 06/01/12  1312   First MD Initiated Contact with Patient 06/01/12 1412      Chief Complaint  Patient presents with  . Vaginal Discharge    mild cramping    (Consider location/radiation/quality/duration/timing/severity/associated sxs/prior treatment) Patient is a 26 y.o. female presenting with vaginal discharge. The history is provided by the patient.  Vaginal Discharge This is a new problem. The current episode started yesterday (requesting morning after pill, also scant vag d/c, abortion on 8/9, no menses yet.).    Past Medical History  Diagnosis Date  . Genital HSV   . Anxiety   . Thyroid disease   . Panic attack   . Mood swings   . Headache     Past Surgical History  Procedure Date  . Induced abortion 2009  . Vaginal delivery 2007  . Breast surgery     Family History  Problem Relation Age of Onset  . Breast cancer Other   . Depression Mother   . Anxiety disorder Mother   . Bipolar disorder Brother   . Diabetes Maternal Grandmother   . Cancer Maternal Grandmother   . Hypertension Maternal Grandmother   . Hypertension Paternal Grandmother     History  Substance Use Topics  . Smoking status: Never Smoker   . Smokeless tobacco: Not on file  . Alcohol Use: Yes     occas    OB History    Grav Para Term Preterm Abortions TAB SAB Ect Mult Living   6 2 2  3 1 2   1       Review of Systems  Constitutional: Negative.   Gastrointestinal: Negative.   Genitourinary: Positive for vaginal discharge. Negative for vaginal bleeding and vaginal pain.  Musculoskeletal: Negative.     Allergies  Zofran  Home Medications   Current Outpatient Rx  Name Route Sig Dispense Refill  . VALACYCLOVIR HCL 1 G PO TABS Oral Take 500 mg by mouth daily as needed. For outbreaks    . CYCLOBENZAPRINE HCL 10 MG PO TABS Oral Take 1 tablet (10 mg total) by mouth every 8 (eight) hours as needed for muscle spasms. 15 tablet 1  .  DIVALPROEX SODIUM 125 MG PO CPSP Oral Take 125 mg by mouth 2 (two) times daily.    Marland Kitchen FAMOTIDINE 20 MG PO TABS Oral Take 1 tablet (20 mg total) by mouth 2 (two) times daily. 30 tablet 0  . CONCEPT OB 130-92.4-1 MG PO CAPS Oral Take 1 tablet by mouth 1 day or 1 dose. 30 capsule 5  . PROMETHAZINE HCL 12.5 MG PO TABS Oral Take 1 tablet (12.5 mg total) by mouth every 6 (six) hours as needed for nausea. 30 tablet 0    BP 110/71  Pulse 97  Temp 98.1 F (36.7 C) (Oral)  Resp 19  SpO2 97%  LMP 02/01/2012  Physical Exam  Nursing note and vitals reviewed. Constitutional: She appears well-developed and well-nourished.  Abdominal: Soft. Bowel sounds are normal.  Genitourinary: Vagina normal and uterus normal. No vaginal discharge found.  Skin: Skin is warm and dry.    ED Course  Procedures (including critical care time)  Labs Reviewed  POCT URINALYSIS DIP (DEVICE) - Abnormal; Notable for the following:    Bilirubin Urine SMALL (*)     Ketones, ur TRACE (*)     Hgb urine dipstick TRACE (*)     Protein, ur 30 (*)     All  other components within normal limits  POCT PREGNANCY, URINE - Abnormal; Notable for the following:    Preg Test, Ur POSITIVE (*)     All other components within normal limits   No results found.   1. Vaginitis       MDM          Linna Hoff, MD 06/01/12 1446

## 2012-06-01 NOTE — Discharge Instructions (Signed)
We will call with test results and treat as indicated °

## 2012-06-03 LAB — GC/CHLAMYDIA PROBE AMP, GENITAL
Chlamydia, DNA Probe: NEGATIVE
GC Probe Amp, Genital: NEGATIVE

## 2012-06-05 NOTE — ED Notes (Signed)
GC/Chlamydia neg., Wet prep: mod. Clue cells, mod. WBC's.  Lab shown to Dr. Artis Flock on 9/2 and he said no discharge found on exam. No symptoms, no treatment.  Vassie Moselle 06/05/2012

## 2012-06-16 ENCOUNTER — Encounter (HOSPITAL_COMMUNITY): Payer: Self-pay

## 2012-06-16 ENCOUNTER — Emergency Department (HOSPITAL_COMMUNITY)
Admission: EM | Admit: 2012-06-16 | Discharge: 2012-06-16 | Disposition: A | Discharge status: Home / Self Care | Payer: Medicaid Other | Source: Home / Self Care

## 2012-06-16 DIAGNOSIS — H109 Unspecified conjunctivitis: Secondary | ICD-10-CM

## 2012-06-16 MED ORDER — TOBRAMYCIN-DEXAMETHASONE 0.3-0.1 % OP SUSP: 1.0000 [drp] | OPHTHALMIC | Status: DC

## 2012-06-16 NOTE — ED Notes (Signed)
Child had pinkeye, now she has been having syx; had been using her son's RX ; NAD

## 2012-06-16 NOTE — ED Provider Notes (Signed)
History     CSN: [id number]  Arrival date & time 06/16/12  1209   None     Chief Complaint  Patient presents with  . Conjunctivitis    (Consider location/radiation/quality/duration/timing/severity/associated sxs/prior treatment) Patient is a 26 y.o. female presenting with conjunctivitis. The history is provided by the patient.  Conjunctivitis  The current episode started 3 to 5 days ago. The problem occurs continuously. The problem has been gradually improving. The problem is mild. Associated symptoms include eye itching, vomiting, eye discharge, eye pain and eye redness. Pertinent negatives include no orthopnea, no fever, no decreased vision, no double vision, no nausea, no congestion, no ear discharge, no ear pain, no rhinorrhea, no neck pain, no neck stiffness, no cough, no wheezing and no rash. The eye pain is mild. There is pain in both eyes. There were sick contacts at home.    Past Medical History  Diagnosis Date  . Genital HSV   . Anxiety   . Thyroid disease   . Panic attack   . Mood swings   . Headache     Past Surgical History  Procedure Date  . Induced abortion 2009  . Vaginal delivery 2007  . Breast surgery     Family History  Problem Relation Age of Onset  . Breast cancer Other   . Depression Mother   . Anxiety disorder Mother   . Bipolar disorder Brother   . Diabetes Maternal Grandmother   . Cancer Maternal Grandmother   . Hypertension Maternal Grandmother   . Hypertension Paternal Grandmother     History  Substance Use Topics  . Smoking status: Never Smoker   . Smokeless tobacco: Not on file  . Alcohol Use: Yes     occas    OB History    Grav Para Term Preterm Abortions TAB SAB Ect Mult Living   6 2 2  3 1 2   1       Review of Systems  Constitutional: Negative for fever.  HENT: Negative for ear pain, congestion, rhinorrhea, neck pain and ear discharge.   Eyes: Positive for pain, discharge, redness and itching. Negative for double  vision.  Respiratory: Negative for cough and wheezing.   Cardiovascular: Negative for orthopnea.  Gastrointestinal: Positive for vomiting. Negative for nausea.  Skin: Negative for rash.  Neurological: Negative.     Allergies  Zofran  Home Medications   Current Outpatient Rx  Name Route Sig Dispense Refill  . CYCLOBENZAPRINE HCL 10 MG PO TABS Oral Take 1 tablet (10 mg total) by mouth every 8 (eight) hours as needed for muscle spasms. 15 tablet 1  . DIVALPROEX SODIUM 125 MG PO CPSP Oral Take 125 mg by mouth 2 (two) times daily.    Marland Kitchen FAMOTIDINE 20 MG PO TABS Oral Take 1 tablet (20 mg total) by mouth 2 (two) times daily. 30 tablet 0  . CONCEPT OB 130-92.4-1 MG PO CAPS Oral Take 1 tablet by mouth 1 day or 1 dose. 30 capsule 5  . PROMETHAZINE HCL 12.5 MG PO TABS Oral Take 1 tablet (12.5 mg total) by mouth every 6 (six) hours as needed for nausea. 30 tablet 0  . TOBRAMYCIN-DEXAMETHASONE 0.3-0.1 % OP SUSP Both Eyes Place 1 drop into both eyes every 4 (four) hours while awake. 5 mL 0  . VALACYCLOVIR HCL 1 G PO TABS Oral Take 500 mg by mouth daily as needed. For outbreaks      BP 107/71  Pulse 105  Temp 98.5 F (36.9  C) (Oral)  Resp 16  SpO2 98%  LMP 02/01/2012  Physical Exam  Constitutional: She is oriented to person, place, and time. She appears well-developed and well-nourished. No distress.  HENT:  Right Ear: External ear normal.  Left Ear: External ear normal.  Mouth/Throat: Oropharynx is clear and moist. No oropharyngeal exudate.  Eyes: EOM are normal. Pupils are equal, round, and reactive to light.       Bilateral conjunctival redness with minor upper lid puffiness. No purulent drainage, sclera with minor, light injection. Anteiror chamber clear.   Neck: Normal range of motion. Neck supple.  Musculoskeletal: Normal range of motion. She exhibits no edema.  Lymphadenopathy:    She has no cervical adenopathy.  Neurological: She is alert and oriented to person, place, and time.    Skin: Skin is warm and dry.  Psychiatric: She has a normal mood and affect.    ED Course  Procedures (including critical care time)  Labs Reviewed - No data to display No results found.   1. Conjunctivitis       MDM  Tobradex drops for 5 d.  Warm compresses.         Hayden Rasmussen, NP 06/16/12 1506

## 2012-06-17 NOTE — ED Provider Notes (Signed)
Medical screening examination/treatment/procedure(s) were performed by resident physician or non-physician practitioner and as supervising physician I was immediately available for consultation/collaboration.   Kirbi Farrugia DOUGLAS MD.    Lamiracle Chaidez D Damyiah Moxley, MD 06/17/12 1909 

## 2012-07-11 ENCOUNTER — Emergency Department (INDEPENDENT_AMBULATORY_CARE_PROVIDER_SITE_OTHER)
Admission: EM | Admit: 2012-07-11 | Discharge: 2012-07-11 | Disposition: A | Discharge status: Home / Self Care | Payer: Medicaid Other | Source: Home / Self Care | Attending: Emergency Medicine | Admitting: Emergency Medicine

## 2012-07-11 ENCOUNTER — Encounter (HOSPITAL_COMMUNITY): Payer: Self-pay

## 2012-07-11 DIAGNOSIS — N730 Acute parametritis and pelvic cellulitis: Secondary | ICD-10-CM

## 2012-07-11 DIAGNOSIS — A499 Bacterial infection, unspecified: Secondary | ICD-10-CM

## 2012-07-11 DIAGNOSIS — N760 Acute vaginitis: Secondary | ICD-10-CM

## 2012-07-11 LAB — WET PREP, GENITAL
Trich, Wet Prep: NONE SEEN
Yeast Wet Prep HPF POC: NONE SEEN

## 2012-07-11 LAB — POCT URINALYSIS DIP (DEVICE)
Bilirubin Urine: NEGATIVE
Glucose, UA: NEGATIVE mg/dL
Ketones, ur: NEGATIVE mg/dL
Leukocytes, UA: NEGATIVE
Nitrite: NEGATIVE
Protein, ur: 30 mg/dL — AB
Specific Gravity, Urine: 1.015 (ref 1.005–1.030)

## 2012-07-11 LAB — POCT PREGNANCY, URINE: Preg Test, Ur: NEGATIVE

## 2012-07-11 MED ORDER — TRAMADOL HCL 50 MG PO TABS: 100.0000 mg | ORAL_TABLET | Freq: Three times a day (TID) | ORAL | Status: DC | PRN

## 2012-07-11 MED ORDER — METRONIDAZOLE 0.75 % VA GEL: 1.0000 | Freq: Two times a day (BID) | VAGINAL | Status: DC

## 2012-07-11 MED ORDER — DOXYCYCLINE HYCLATE 100 MG PO TABS: 100.0000 mg | ORAL_TABLET | Freq: Two times a day (BID) | ORAL | Status: DC

## 2012-07-11 MED ORDER — CEFTRIAXONE SODIUM 250 MG IJ SOLR
INTRAMUSCULAR | Status: AC
  Filled 2012-07-11: qty 250

## 2012-07-11 MED ORDER — CEFTRIAXONE SODIUM 250 MG IJ SOLR
250.0000 mg | Freq: Once | INTRAMUSCULAR | Status: AC
  Administered 2012-07-11: 250 mg via INTRAMUSCULAR

## 2012-07-11 MED ORDER — LIDOCAINE HCL (PF) 1 % IJ SOLN
INTRAMUSCULAR | Status: AC
  Filled 2012-07-11: qty 5

## 2012-07-11 NOTE — ED Provider Notes (Signed)
Chief Complaint  Patient presents with  . Abdominal Pain    History of Present Illness:    The patient is a 26 year old female who presents today with a four-day history of left lower quadrant abdominal pain that comes and goes. It's described as an ache and is fairly severe, rated 9-10 over 10 in intensity when it comes on. It may last for minutes to hours at a time and then goes away. The pain is worse with intercourse. Nothing particular makes it better. It radiates through to the left lower back area and is associated with nausea and a few loose stools. She denies fever, chills, vomiting, blood in the stool, dysuria, frequency, or urgency. She has had some white discharge and odor. She also has been spotting for the past week. She had a pregnancy termination on August 9. Ever since then she's been sexually active without use of birth control. Last normal menstrual period was September 14.  Review of Systems:  Other than noted above, the patient denies any of the following symptoms: Constitutional:  No fever, chills, fatigue, weight loss or anorexia. Lungs:  No cough or shortness of breath. Heart:  No chest pain, palpitations, syncope or edema.  No cardiac history. Abdomen:  No nausea, vomiting, hematememesis, melena, diarrhea, or hematochezia. GU:  No dysuria, frequency, urgency, or hematuria. Gyn:  No vaginal discharge, itching, abnormal bleeding, dyspareunia, or pelvic pain.  PMFSH:  Past medical history, family history, social history, meds, and allergies were reviewed along with nurse's notes.  No prior abdominal surgeries, past history of GI problems, STDs or GYN problems.  No history of aspirin or NSAID use.  No excessive alcohol intake.  Physical Exam:   Vital signs:  BP 119/83  Pulse 86  Temp 98.5 F (36.9 C) (Oral)  Resp 18  SpO2 99%  LMP 06/26/2012  Breastfeeding? Unknown Gen:  Alert, oriented, in no distress. Lungs:  Breath sounds clear and equal bilaterally.  No wheezes,  rales or rhonchi. Heart:  Regular rhythm.  No gallops or murmers.   Abdomen:  Soft, flat, and nondistended. There is mild tenderness to palpation in the left lower cautery and without guarding or rebound. No organomegaly or mass. Bowel sounds are normally active. Pelvic:  Normal external genitalia, vaginal and cervical mucosa were normal. There was no discharge. She has mild cervical motion tenderness. Uterus is mid position, normal in size and nontender. She has mild bilateral adnexal tenderness but no mass. Skin:  Clear, warm and dry.  No rash.  Labs:   Results for orders placed during the hospital encounter of 07/11/12  WET PREP, GENITAL      Component Value Range   Yeast Wet Prep HPF POC NONE SEEN  NONE SEEN   Trich, Wet Prep NONE SEEN  NONE SEEN   Clue Cells Wet Prep HPF POC MODERATE (*) NONE SEEN   WBC, Wet Prep HPF POC MANY (*) NONE SEEN  POCT URINALYSIS DIP (DEVICE)      Component Value Range   Glucose, UA NEGATIVE  NEGATIVE mg/dL   Bilirubin Urine NEGATIVE  NEGATIVE   Ketones, ur NEGATIVE  NEGATIVE mg/dL   Specific Gravity, Urine 1.015  1.005 - 1.030   Hgb urine dipstick NEGATIVE  NEGATIVE   pH 7.5  5.0 - 8.0   Protein, ur 30 (*) NEGATIVE mg/dL   Urobilinogen, UA 1.0  0.0 - 1.0 mg/dL   Nitrite NEGATIVE  NEGATIVE   Leukocytes, UA NEGATIVE  NEGATIVE  POCT PREGNANCY, URINE  Component Value Range   Preg Test, Ur NEGATIVE  NEGATIVE    Other Labs Obtained at Urgent Care Center:  Urine culture and cervical smear for GC and Chlamydia DNA probe were obtained.  Results are pending at this time and we will call about any positive results.  Course in Urgent Care Center:   She was given Rocephin 250 mg IM and tolerated this well without any immediate side effects.  Assessment:  The primary encounter diagnosis was PID (acute pelvic inflammatory disease). A diagnosis of Bacterial vaginosis was also pertinent to this visit.  Plan:   1.  The following meds were prescribed:   New  Prescriptions   DOXYCYCLINE (VIBRA-TABS) 100 MG TABLET    Take 1 tablet (100 mg total) by mouth 2 (two) times daily.   METRONIDAZOLE (METROGEL) 0.75 % VAGINAL GEL    Place 1 Applicatorful vaginally 2 (two) times daily.   TRAMADOL (ULTRAM) 50 MG TABLET    Take 2 tablets (100 mg total) by mouth every 8 (eight) hours as needed for pain.   2.  The patient was instructed in symptomatic care and handouts were given. 3.  The patient was told to return if becoming worse in any way, in 48 hours for recheck, and given some red flag symptoms that would indicate earlier return.    Melinda Likes, MD 07/11/12 [phone]

## 2012-07-11 NOTE — ED Notes (Signed)
Has been having LLQ abdominal pain w pink d/c

## 2012-07-12 LAB — GC/CHLAMYDIA PROBE AMP, GENITAL
Chlamydia, DNA Probe: NEGATIVE
GC Probe Amp, Genital: NEGATIVE

## 2012-07-13 LAB — URINE CULTURE
Colony Count: 5000
Special Requests: NORMAL

## 2012-07-15 NOTE — ED Notes (Signed)
GC/Chlamydia neg., Wet prep: mod. clue cells, many WBC's, Urine culture: Insignificant growth.  Pt. Adequately treated with Metrogel. Vassie Moselle 07/15/2012

## 2012-10-11 ENCOUNTER — Inpatient Hospital Stay (HOSPITAL_COMMUNITY)
Admission: AD | Admit: 2012-10-11 | Discharge: 2012-10-11 | Disposition: A | Discharge status: Home / Self Care | Payer: Medicaid Other | Source: Ambulatory Visit | Attending: Obstetrics & Gynecology | Admitting: Obstetrics & Gynecology

## 2012-10-11 ENCOUNTER — Encounter (HOSPITAL_COMMUNITY): Payer: Self-pay | Admitting: Family

## 2012-10-11 DIAGNOSIS — R1013 Epigastric pain: Secondary | ICD-10-CM | POA: Insufficient documentation

## 2012-10-11 DIAGNOSIS — O26899 Other specified pregnancy related conditions, unspecified trimester: Secondary | ICD-10-CM

## 2012-10-11 DIAGNOSIS — O210 Mild hyperemesis gravidarum: Secondary | ICD-10-CM | POA: Insufficient documentation

## 2012-10-11 DIAGNOSIS — R12 Heartburn: Secondary | ICD-10-CM | POA: Insufficient documentation

## 2012-10-11 DIAGNOSIS — O219 Vomiting of pregnancy, unspecified: Secondary | ICD-10-CM

## 2012-10-11 HISTORY — DX: Unspecified infectious disease: B99.9

## 2012-10-11 HISTORY — DX: Chlamydial infection, unspecified: A74.9

## 2012-10-11 HISTORY — DX: Trichomoniasis, unspecified: A59.9

## 2012-10-11 LAB — URINALYSIS, ROUTINE W REFLEX MICROSCOPIC
Glucose, UA: NEGATIVE mg/dL
Hgb urine dipstick: NEGATIVE
Protein, ur: NEGATIVE mg/dL
pH: 6 (ref 5.0–8.0)

## 2012-10-11 LAB — POCT PREGNANCY, URINE: Preg Test, Ur: POSITIVE — AB

## 2012-10-11 MED ORDER — PROMETHAZINE HCL 25 MG PO TABS
25.0000 mg | ORAL_TABLET | Freq: Once | ORAL | Status: AC
  Administered 2012-10-11: 25 mg via ORAL
  Filled 2012-10-11: qty 1

## 2012-10-11 MED ORDER — RANITIDINE HCL 150 MG PO TABS: 150.0000 mg | ORAL_TABLET | Freq: Two times a day (BID) | ORAL | Status: DC

## 2012-10-11 MED ORDER — PROMETHAZINE HCL 25 MG PO TABS: 25.0000 mg | ORAL_TABLET | Freq: Four times a day (QID) | ORAL | Status: DC | PRN

## 2012-10-11 NOTE — MAU Provider Note (Signed)
History     CSN: [id number]  Arrival date and time: 10/11/12 1319   None     Chief Complaint  Patient presents with  . Nausea  . Vomiting   HPI 27 y.o. [id number] at [redacted]w[redacted]d with n/v x 1 week. Also c/o some epigastric pain intermittently "when I'm not eating".    Past Medical History  Diagnosis Date  . Genital HSV   . Anxiety   . Thyroid disease   . Panic attack   . Mood swings   . Headache   . Chlamydia   . Trichomonas   . Infection   . Panic attack     Past Surgical History  Procedure Date  . Induced abortion 2009  . Vaginal delivery 2007  . Breast surgery     Family History  Problem Relation Age of Onset  . Breast cancer Other   . Depression Mother   . Anxiety disorder Mother   . Bipolar disorder Brother   . Diabetes Maternal Grandmother   . Cancer Maternal Grandmother   . Hypertension Maternal Grandmother   . Hypertension Paternal Grandmother     History  Substance Use Topics  . Smoking status: Never Smoker   . Smokeless tobacco: Never Used  . Alcohol Use: Yes     Comment: occas    Allergies:  Allergies  Allergen Reactions  . Zofran (Ondansetron Hcl) Nausea And Vomiting    Prescriptions prior to admission  Medication Sig Dispense Refill  . valACYclovir (VALTREX) 1000 MG tablet Take 500 mg by mouth daily as needed. For outbreaks        Review of Systems  Constitutional: Negative.   Respiratory: Negative.   Cardiovascular: Negative.   Gastrointestinal: Positive for heartburn, nausea, vomiting and abdominal pain. Negative for diarrhea and constipation.  Genitourinary: Negative for dysuria, urgency, frequency, hematuria and flank pain.       Negative for vaginal bleeding, vaginal discharge  Musculoskeletal: Negative.   Neurological: Negative.   Psychiatric/Behavioral: Negative.    Physical Exam   Blood pressure 119/72, pulse 105, temperature 98 F (36.7 C), temperature source Oral, resp. rate 18, height 5\' 7"  (1.702 m), weight 232 lb 3.2  oz (105.325 kg), last menstrual period 08/31/2012.  Physical Exam  Nursing note and vitals reviewed. Constitutional: She is oriented to person, place, and time. She appears well-developed and well-nourished. No distress.  Cardiovascular: Normal rate.   Respiratory: Effort normal.  GI: Soft. She exhibits no distension and no mass. There is no tenderness. There is no rebound and no guarding.  Musculoskeletal: Normal range of motion.  Neurological: She is alert and oriented to person, place, and time.  Skin: Skin is warm and dry.  Psychiatric: She has a normal mood and affect.    MAU Course  Procedures Results for orders placed during the hospital encounter of 10/11/12 (from the past 48 hour(s))  URINALYSIS, ROUTINE W REFLEX MICROSCOPIC     Status: Abnormal   Collection Time   10/11/12  1:55 PM      Component Value Range Comment   Color, Urine YELLOW  YELLOW    APPearance CLEAR  CLEAR    Specific Gravity, Urine >1.030 (*) 1.005 - 1.030    pH 6.0  5.0 - 8.0    Glucose, UA NEGATIVE  NEGATIVE mg/dL    Hgb urine dipstick NEGATIVE  NEGATIVE    Bilirubin Urine NEGATIVE  NEGATIVE    Ketones, ur NEGATIVE  NEGATIVE mg/dL    Protein, ur NEGATIVE  NEGATIVE  mg/dL    Urobilinogen, UA 0.2  0.0 - 1.0 mg/dL    Nitrite NEGATIVE  NEGATIVE    Leukocytes, UA NEGATIVE  NEGATIVE MICROSCOPIC NOT DONE ON URINES WITH NEGATIVE PROTEIN, BLOOD, LEUKOCYTES, NITRITE, OR GLUCOSE <1000 mg/dL.  POCT PREGNANCY, URINE     Status: Abnormal   Collection Time   10/11/12  2:04 PM      Component Value Range Comment   Preg Test, Ur POSITIVE (*) NEGATIVE      Assessment and Plan   1. Nausea and vomiting of pregnancy, antepartum   2. Heartburn in pregnancy       Medication List     As of 10/12/2012  6:17 PM    START taking these medications         promethazine 25 MG tablet   Commonly known as: PHENERGAN   Take 1 tablet (25 mg total) by mouth every 6 (six) hours as needed for nausea.      ranitidine 150 MG  tablet   Commonly known as: ZANTAC   Take 1 tablet (150 mg total) by mouth 2 (two) times daily.      CONTINUE taking these medications         valACYclovir 1000 MG tablet   Commonly known as: VALTREX          Where to get your medications    These are the prescriptions that you need to pick up. We sent them to a specific pharmacy, so you will need to go there to get them.   [id number] [place], [place] - [phone] AT [facility] [address]    [address] [place] [address]    Phone: [phone]        promethazine 25 MG tablet   ranitidine 150 MG tablet            Follow-up Information    Follow up with [facility] [place]. (start prenatal care as soon as possible)    Contact information:   [address] [place] [address] [phone]           Marquett Bertoli 10/11/2012, 2:26 PM

## 2012-10-11 NOTE — MAU Note (Signed)
Pt has had 3 +HPT. Reports having abd pain that shoots through her abd and back and Headache pain. Pt reports she has had N/V x 1 week hard to keep food down.

## 2012-10-11 NOTE — MAU Note (Addendum)
Patient presents to MAU with c/o H/A, N/V x one week. Denies fever, chills, or diarrhea. Reports intermittent epigastric pain.  Positive HPTs on 12/26, last week, and Thursday of this week. States she has hx of N/V in first trimester of pregnancy.  LMP 12/1.

## 2012-10-13 NOTE — MAU Provider Note (Signed)
Attestation of Attending Supervision of Advanced Practitioner (PA/CNM/NP): Evaluation and management procedures were performed by the Advanced Practitioner under my supervision and collaboration.  I have reviewed the Advanced Practitioner's note and chart, and I agree with the management and plan.  Taeshaun Rames, MD, FACOG Attending Obstetrician & Gynecologist Faculty Practice, [facility]  

## 2012-11-08 ENCOUNTER — Inpatient Hospital Stay (HOSPITAL_COMMUNITY): Payer: Medicaid Other

## 2012-11-08 ENCOUNTER — Encounter (HOSPITAL_COMMUNITY): Payer: Self-pay | Admitting: Family

## 2012-11-08 ENCOUNTER — Inpatient Hospital Stay (HOSPITAL_COMMUNITY)
Admission: AD | Admit: 2012-11-08 | Discharge: 2012-11-08 | Disposition: A | Discharge status: Home / Self Care | Payer: Medicaid Other | Source: Ambulatory Visit | Attending: Obstetrics & Gynecology | Admitting: Obstetrics & Gynecology

## 2012-11-08 DIAGNOSIS — O26899 Other specified pregnancy related conditions, unspecified trimester: Secondary | ICD-10-CM

## 2012-11-08 DIAGNOSIS — O219 Vomiting of pregnancy, unspecified: Secondary | ICD-10-CM

## 2012-11-08 DIAGNOSIS — R109 Unspecified abdominal pain: Secondary | ICD-10-CM | POA: Insufficient documentation

## 2012-11-08 DIAGNOSIS — O99891 Other specified diseases and conditions complicating pregnancy: Secondary | ICD-10-CM | POA: Insufficient documentation

## 2012-11-08 DIAGNOSIS — O210 Mild hyperemesis gravidarum: Secondary | ICD-10-CM | POA: Insufficient documentation

## 2012-11-08 LAB — URINALYSIS, ROUTINE W REFLEX MICROSCOPIC
Bilirubin Urine: NEGATIVE
Glucose, UA: NEGATIVE mg/dL
Leukocytes, UA: NEGATIVE
Nitrite: NEGATIVE
Specific Gravity, Urine: 1.02 (ref 1.005–1.030)
pH: 7 (ref 5.0–8.0)

## 2012-11-08 LAB — WET PREP, GENITAL
Clue Cells Wet Prep HPF POC: NONE SEEN
Trich, Wet Prep: NONE SEEN

## 2012-11-08 LAB — CBC
HCT: 35.5 % — ABNORMAL LOW (ref 36.0–46.0)
MCH: 23.3 pg — ABNORMAL LOW (ref 26.0–34.0)
MCHC: 31.8 g/dL (ref 30.0–36.0)
MCV: 73.2 fL — ABNORMAL LOW (ref 78.0–100.0)
RDW: 17.7 % — ABNORMAL HIGH (ref 11.5–15.5)
WBC: 9.9 10*3/uL (ref 4.0–10.5)

## 2012-11-08 LAB — ABO/RH: ABO/RH(D): A POS

## 2012-11-08 MED ORDER — PROMETHAZINE HCL 25 MG RE SUPP: 25.0000 mg | Freq: Four times a day (QID) | RECTAL | Status: DC | PRN

## 2012-11-08 NOTE — MAU Note (Signed)
Patient presents to MAU with c/o intermittent bilateral lower abdominal pains and increased urinary frequency x 1 week. Has first prenatal appointment on Wednesday with [facility].

## 2012-11-08 NOTE — MAU Note (Signed)
Pt presents with complaints of abdominal pain and urinating frequently today. States back pain for a couple of weeks. Continues to have nausea and vomiting even though she is taking phenergan

## 2012-11-08 NOTE — MAU Provider Note (Signed)
History     CSN: [id number]  Arrival date and time: 11/08/12 1601   None     Chief Complaint  Patient presents with  . Abdominal Pain  . Back Pain   HPI Melinda Jimenez is 27 y.o. [phone] [redacted]w[redacted]d weeks presenting with cramping that is similar to menstrual cramping that also goes into the vagina. Patient of Dr. Marcia Brash.  Has daily nausea and vomiting even with phenergan.  Muscle spasm in her legs and "a big one in my stomach for 15 minutes".  Vomited X 4 today.  Denies vaginal discharge, bleeding or odor.  Headaches off and on.     Past Medical History  Diagnosis Date  . Genital HSV   . Anxiety   . Thyroid disease   . Panic attack   . Mood swings   . Headache   . Chlamydia   . Trichomonas   . Infection   . Panic attack     Past Surgical History  Procedure Laterality Date  . Induced abortion  2009  . Vaginal delivery  2007  . Breast surgery      Family History  Problem Relation Age of Onset  . Breast cancer Other   . Depression Mother   . Anxiety disorder Mother   . Bipolar disorder Brother   . Diabetes Maternal Grandmother   . Cancer Maternal Grandmother   . Hypertension Maternal Grandmother   . Hypertension Paternal Grandmother     History  Substance Use Topics  . Smoking status: Never Smoker   . Smokeless tobacco: Never Used  . Alcohol Use: Yes     Comment: occas    Allergies:  Allergies  Allergen Reactions  . Zofran (Ondansetron Hcl) Nausea And Vomiting    Prescriptions prior to admission  Medication Sig Dispense Refill  . promethazine (PHENERGAN) 25 MG tablet Take 1 tablet (25 mg total) by mouth every 6 (six) hours as needed for nausea.  60 tablet  1  . ranitidine (ZANTAC) 150 MG tablet Take 1 tablet (150 mg total) by mouth 2 (two) times daily.  60 tablet  1  . valACYclovir (VALTREX) 1000 MG tablet Take 500 mg by mouth daily as needed. For outbreaks        Review of Systems  Constitutional: Negative for fever and chills.  Cardiovascular:  Negative.   Gastrointestinal: Positive for nausea, vomiting and abdominal pain.  Genitourinary:       Negative for vaginal bleeding, discharge or odor  Neurological: Positive for headaches.   Physical Exam   Blood pressure 101/60, pulse 102, temperature 97.8 F (36.6 C), temperature source Oral, resp. rate 18, height 5' 7.5" (1.715 m), weight 228 lb (103.42 kg), last menstrual period 08/31/2012.  Physical Exam  Constitutional: She is oriented to person, place, and time. She appears well-developed and well-nourished. No distress.  HENT:  Head: Normocephalic and atraumatic.  Cardiovascular: Normal rate, regular rhythm and normal heart sounds.   Respiratory: Effort normal and breath sounds normal. No respiratory distress.  GI: Soft. Bowel sounds are normal. She exhibits no distension and no mass. There is no tenderness. There is no rebound and no guarding.  Genitourinary: Vagina normal and uterus normal. Uterus is not enlarged and not tender. Cervix exhibits discharge (small amount of mucus discharge noted). Cervix exhibits no motion tenderness and no friability. Right adnexum displays no mass and no tenderness. Left adnexum displays no mass and no tenderness.  Neurological: She is alert and oriented to person, place, and time.  Skin: Skin is warm and dry. No erythema.  Psychiatric: She has a normal mood and affect.   Results for orders placed during the hospital encounter of 11/08/12 (from the past 24 hour(s))  URINALYSIS, ROUTINE W REFLEX MICROSCOPIC     Status: None   Collection Time    11/08/12  4:15 PM      Result Value Range   Color, Urine YELLOW  YELLOW   APPearance CLEAR  CLEAR   Specific Gravity, Urine 1.020  1.005 - 1.030   pH 7.0  5.0 - 8.0   Glucose, UA NEGATIVE  NEGATIVE mg/dL   Hgb urine dipstick NEGATIVE  NEGATIVE   Bilirubin Urine NEGATIVE  NEGATIVE   Ketones, ur NEGATIVE  NEGATIVE mg/dL   Protein, ur NEGATIVE  NEGATIVE mg/dL   Urobilinogen, UA 0.2  0.0 - 1.0 mg/dL    Nitrite NEGATIVE  NEGATIVE   Leukocytes, UA NEGATIVE  NEGATIVE  ABO/RH     Status: None   Collection Time    11/08/12  5:16 PM      Result Value Range   ABO/RH(D) A POS    HCG, QUANTITATIVE, PREGNANCY     Status: Abnormal   Collection Time    11/08/12  5:16 PM      Result Value Range   hCG, Beta Francene Finders [address] (*) <5 mIU/mL  CBC     Status: Abnormal   Collection Time    11/08/12  5:17 PM      Result Value Range   WBC 9.9  4.0 - 10.5 K/uL   RBC 4.85  3.87 - 5.11 MIL/uL   Hemoglobin 11.3 (*) 12.0 - 15.0 g/dL   HCT 60.4 (*) 54.0 - 98.1 %   MCV 73.2 (*) 78.0 - 100.0 fL   MCH 23.3 (*) 26.0 - 34.0 pg   MCHC 31.8  30.0 - 36.0 g/dL   RDW 19.1 (*) 47.8 - 29.5 %   Platelets 315  150 - 400 K/uL  WET PREP, GENITAL     Status: Abnormal   Collection Time    11/08/12  5:20 PM      Result Value Range   Yeast Wet Prep HPF POC NONE SEEN  NONE SEEN   Trich, Wet Prep NONE SEEN  NONE SEEN   Clue Cells Wet Prep HPF POC NONE SEEN  NONE SEEN   WBC, Wet Prep HPF POC FEW (*) NONE SEEN   *RADIOLOGY REPORT*   Clinical Data: Lower abdominal pain and cramping. Estimated  gestational age by last menstrual period equals 9 weeks 6 days.   OBSTETRIC <14 WK ULTRASOUND   Technique: Transabdominal ultrasound was performed for evaluation  of the gestation as well as the maternal uterus and adnexal  regions.   Comparison: None.   Intrauterine gestational sac: Single  Yolk sac: Present  Embryo: Present  Cardiac Activity: Present  Heart Rate: 165 bpm  CRL: 30 mm mm 9 w six d [place] EDC: 5972 1014  Maternal uterus/Adnexae:  Uterus is retroflexed. The right ovary is normal. Left ovary is  not identified. No free fluid.   IMPRESSION:  1. Single intrauterine gestation with embryo and normal cardiac  activity.  2. Estimated gestational age by crown-rump length equals 9 weeks 6  days.   Original Report Authenticated By: Genevive Bi, M.D.  MAU Course  Procedures  MDM 17:00  Care turned  over to J.Ethier, PA  ASSESSMENT AND PLAN  ETHIER, Jamy Whyte N. 11/08/2012, 7:00 PM   A: Nausea  and vomiting in early pregnancy Abdominal pain in early pregnancy   P: Discharge home Rx for phenergan suppositories sent to patient's pharmacy Keep scheduled appointment with Dr. Tamela Oddi for this week Patient may return to MAU as needed or if her condition should change or worsen  Freddi Starr, PA-C 11/08/2012 7:00 PM

## 2012-11-11 LAB — GC/CHLAMYDIA PROBE AMP
CT Probe RNA: NEGATIVE
GC Probe RNA: NEGATIVE

## 2012-12-02 LAB — OB RESULTS CONSOLE RUBELLA ANTIBODY, IGM: Rubella: IMMUNE

## 2012-12-02 LAB — OB RESULTS CONSOLE ABO/RH

## 2012-12-02 LAB — OB RESULTS CONSOLE ANTIBODY SCREEN: Antibody Screen: NEGATIVE

## 2012-12-03 ENCOUNTER — Inpatient Hospital Stay (HOSPITAL_COMMUNITY)
Admission: AD | Admit: 2012-12-03 | Discharge: 2012-12-03 | Disposition: A | Discharge status: Home / Self Care | Payer: Medicaid Other | Source: Ambulatory Visit | Attending: Obstetrics & Gynecology | Admitting: Obstetrics & Gynecology

## 2012-12-03 ENCOUNTER — Encounter (HOSPITAL_COMMUNITY): Payer: Self-pay | Admitting: Obstetrics and Gynecology

## 2012-12-03 DIAGNOSIS — K602 Anal fissure, unspecified: Secondary | ICD-10-CM | POA: Insufficient documentation

## 2012-12-03 DIAGNOSIS — O99891 Other specified diseases and conditions complicating pregnancy: Secondary | ICD-10-CM | POA: Insufficient documentation

## 2012-12-03 DIAGNOSIS — K625 Hemorrhage of anus and rectum: Secondary | ICD-10-CM | POA: Insufficient documentation

## 2012-12-03 DIAGNOSIS — R1032 Left lower quadrant pain: Secondary | ICD-10-CM | POA: Insufficient documentation

## 2012-12-03 LAB — URINALYSIS, ROUTINE W REFLEX MICROSCOPIC
Bilirubin Urine: NEGATIVE
Glucose, UA: NEGATIVE mg/dL
Hgb urine dipstick: NEGATIVE
Ketones, ur: NEGATIVE mg/dL
Protein, ur: NEGATIVE mg/dL
pH: 6 (ref 5.0–8.0)

## 2012-12-03 NOTE — MAU Note (Signed)
Name and DOB verified, pt confirmed spelling is correct on ID band.

## 2012-12-03 NOTE — MAU Note (Signed)
Had sexual (rectal)intercourse on Mon, was not painful. Has had 2 bm since then.  When had 3rd, noted bleeding and small blot. Has been having pain, cramps on left side.

## 2012-12-03 NOTE — MAU Provider Note (Signed)
History     CSN: [id number]  Arrival date and time: 12/03/12 1043   First Provider Initiated Contact with Patient 12/03/12 1140      Chief Complaint  Patient presents with  . Rectal Bleeding   HPI Melinda Jimenez is 27 y.o. [phone] [redacted]w[redacted]d weeks presenting with rectal bleeding after a bowel movement last night.  Called the doctor last night and told to come in.  Didn't have a ride last night.  Has 1-2 bowel movements a day.  Denies constipation.  Left sided abdominal pain that began 2 days ago.  Worst pain 9/10 with no pain at this time.  Denies vaginal bleeding. Has tried tylenol that helped.  Just started prenatal vitamins 2 days ago.  Saw MD in office 2 days ago.  Denies hemorrhoids.  History of HVS-last outbreak 1 year ago.    Past Medical History  Diagnosis Date  . Genital HSV   . Anxiety   . Thyroid disease   . Panic attack   . Mood swings   . Headache   . Chlamydia   . Trichomonas   . Infection   . Panic attack     Past Surgical History  Procedure Laterality Date  . Induced abortion  2009  . Vaginal delivery  2007  . Breast surgery      Family History  Problem Relation Age of Onset  . Breast cancer Other   . Depression Mother   . Anxiety disorder Mother   . Bipolar disorder Brother   . Diabetes Maternal Grandmother   . Cancer Maternal Grandmother   . Hypertension Maternal Grandmother   . Hypertension Paternal Grandmother     History  Substance Use Topics  . Smoking status: Never Smoker   . Smokeless tobacco: Never Used  . Alcohol Use: Yes     Comment: occas    Allergies:  Allergies  Allergen Reactions  . Zofran (Ondansetron Hcl) Nausea And Vomiting    Prescriptions prior to admission  Medication Sig Dispense Refill  . acetaminophen (TYLENOL) 500 MG tablet Take 1,000 mg by mouth every 6 (six) hours as needed for pain.      . Prenatal Vit-Fe Fumarate-FA (PRENATAL MULTIVITAMIN) TABS Take 1 tablet by mouth daily at 12 noon.      . valACYclovir  (VALTREX) 1000 MG tablet Take 500 mg by mouth daily as needed. For outbreaks        Review of Systems  Constitutional: Negative.   HENT: Negative.   Respiratory: Negative.   Cardiovascular: Negative.   Gastrointestinal: Positive for abdominal pain (lower left last night, none today) and blood in stool (seen when wiping). Negative for nausea, vomiting, diarrhea and constipation.   Physical Exam   Blood pressure 122/74, pulse 115, temperature 98.4 F (36.9 C), temperature source Oral, resp. rate 18, height 5' 7.5" (1.715 m), weight 235 lb (106.595 kg), last menstrual period 08/31/2012.  Physical Exam  Constitutional: She appears well-developed and well-nourished. No distress.  HENT:  Head: Normocephalic.  Neck: Normal range of motion.  Respiratory: Effort normal.  GI: Soft. She exhibits no distension and no mass. There is no tenderness. There is no rebound and no guarding.  Genitourinary: Rectal exam shows fissure (very small tear without bleeding or redness). Rectal exam shows no external hemorrhoid, no internal hemorrhoid, no mass, no tenderness and anal tone normal.   Results for orders placed during the hospital encounter of 12/03/12 (from the past 24 hour(s))  URINALYSIS, ROUTINE W REFLEX MICROSCOPIC  Status: Abnormal   Collection Time    12/03/12 11:05 AM      Result Value Range   Color, Urine YELLOW  YELLOW   APPearance CLEAR  CLEAR   Specific Gravity, Urine >1.030 (*) 1.005 - 1.030   pH 6.0  5.0 - 8.0   Glucose, UA NEGATIVE  NEGATIVE mg/dL   Hgb urine dipstick NEGATIVE  NEGATIVE   Bilirubin Urine NEGATIVE  NEGATIVE   Ketones, ur NEGATIVE  NEGATIVE mg/dL   Protein, ur NEGATIVE  NEGATIVE mg/dL   Urobilinogen, UA 0.2  0.0 - 1.0 mg/dL   Nitrite NEGATIVE  NEGATIVE   Leukocytes, UA NEGATIVE  NEGATIVE   MAU Course  Procedures  MDM  Assessment and Plan  A;  Rectal bleeding after bowel movement      Anal fissure     Left lower abdominal pain  P:  Encouraged to  continue prenatal vitamins      Keep stool soft with adequate hydration, fresh fruits, vegetables and fiber Followup with Dr. Tamela Oddi for recurrent bleeding  KEY,EVE M 12/03/2012, 11:47 AM

## 2012-12-03 NOTE — MAU Note (Signed)
"  I started having bleeding last night about 2140.  I had anal intercourse day before yesterday.  It was not the first time I have had anal intercourse, but the first time I had a problem with bleeding.  I didn't use any lubrication.  I am also having LLQ that started the same night of the anal intercourse."

## 2012-12-10 LAB — OB RESULTS CONSOLE GC/CHLAMYDIA: Gonorrhea: NEGATIVE

## 2012-12-20 ENCOUNTER — Encounter: Payer: Self-pay | Admitting: *Deleted

## 2012-12-23 ENCOUNTER — Encounter: Payer: Self-pay | Admitting: Obstetrics

## 2012-12-23 ENCOUNTER — Ambulatory Visit (INDEPENDENT_AMBULATORY_CARE_PROVIDER_SITE_OTHER): Payer: Medicaid Other | Admitting: Obstetrics

## 2012-12-23 VITALS — BP 106/66 | Temp 98.2°F | Wt 234.0 lb

## 2012-12-23 DIAGNOSIS — Z3482 Encounter for supervision of other normal pregnancy, second trimester: Secondary | ICD-10-CM

## 2012-12-23 DIAGNOSIS — Z3480 Encounter for supervision of other normal pregnancy, unspecified trimester: Secondary | ICD-10-CM

## 2012-12-23 LAB — POCT URINALYSIS DIPSTICK
Bilirubin, UA: NEGATIVE
Blood, UA: NEGATIVE
Glucose, UA: NEGATIVE
Ketones, UA: NEGATIVE
Leukocytes, UA: NEGATIVE
Nitrite, UA: NEGATIVE
Urobilinogen, UA: NEGATIVE

## 2012-12-23 NOTE — Progress Notes (Signed)
Pulse: 88

## 2012-12-23 NOTE — Addendum Note (Signed)
Addended by: Brock Bad on: 12/23/2012 05:28 PM   Modules accepted: Orders

## 2012-12-23 NOTE — Progress Notes (Signed)
No complaints. Doing well.

## 2012-12-26 LAB — AFP, QUAD SCREEN
AFP: 30.6 IU/mL
HCG, Total: 15368 m[IU]/mL
MoM for AFP: 1.18
MoM for INH: 0.45
MoM for hCG: 0.82
Open Spina bifida: NEGATIVE
Osb Risk: 1:14500 {titer}
Tri 18 Scr Risk Est: NEGATIVE
uE3 Value: 0.5 ng/mL

## 2013-01-20 ENCOUNTER — Encounter: Payer: Self-pay | Admitting: Obstetrics

## 2013-01-20 ENCOUNTER — Ambulatory Visit (INDEPENDENT_AMBULATORY_CARE_PROVIDER_SITE_OTHER): Payer: Medicaid Other

## 2013-01-20 ENCOUNTER — Ambulatory Visit (INDEPENDENT_AMBULATORY_CARE_PROVIDER_SITE_OTHER): Payer: Medicaid Other | Admitting: Obstetrics

## 2013-01-20 ENCOUNTER — Encounter: Payer: Medicaid Other | Admitting: Obstetrics

## 2013-01-20 VITALS — BP 117/76 | Temp 97.0°F | Wt 236.0 lb

## 2013-01-20 DIAGNOSIS — Z3480 Encounter for supervision of other normal pregnancy, unspecified trimester: Secondary | ICD-10-CM

## 2013-01-20 DIAGNOSIS — F419 Anxiety disorder, unspecified: Secondary | ICD-10-CM | POA: Insufficient documentation

## 2013-01-20 DIAGNOSIS — Z3482 Encounter for supervision of other normal pregnancy, second trimester: Secondary | ICD-10-CM

## 2013-01-20 DIAGNOSIS — Z3400 Encounter for supervision of normal first pregnancy, unspecified trimester: Secondary | ICD-10-CM

## 2013-01-20 DIAGNOSIS — F341 Dysthymic disorder: Secondary | ICD-10-CM

## 2013-01-20 DIAGNOSIS — F329 Major depressive disorder, single episode, unspecified: Secondary | ICD-10-CM

## 2013-01-20 DIAGNOSIS — K219 Gastro-esophageal reflux disease without esophagitis: Secondary | ICD-10-CM | POA: Insufficient documentation

## 2013-01-20 HISTORY — DX: Gastro-esophageal reflux disease without esophagitis: K21.9

## 2013-01-20 LAB — US OB DETAIL + 14 WK

## 2013-01-20 LAB — POCT URINALYSIS DIPSTICK
Bilirubin, UA: NEGATIVE
Ketones, UA: NEGATIVE
Spec Grav, UA: 1.025
pH, UA: 5

## 2013-01-20 MED ORDER — BUPROPION HCL ER (XL) 150 MG PO TB24: 150.0000 mg | ORAL_TABLET | Freq: Every day | ORAL | Status: DC

## 2013-01-20 MED ORDER — OMEPRAZOLE 20 MG PO CPDR: 20.0000 mg | DELAYED_RELEASE_CAPSULE | Freq: Every day | ORAL | Status: DC

## 2013-01-20 NOTE — Patient Instructions (Signed)
GERD

## 2013-01-20 NOTE — Progress Notes (Signed)
Pulse-96 No complaints.

## 2013-01-20 NOTE — Addendum Note (Signed)
Addended by: Coral Ceo A on: 01/20/2013 04:04 PM   Modules accepted: Orders, Medications

## 2013-01-23 ENCOUNTER — Encounter: Payer: Self-pay | Admitting: Obstetrics

## 2013-02-01 ENCOUNTER — Inpatient Hospital Stay (HOSPITAL_COMMUNITY)
Admission: AD | Admit: 2013-02-01 | Discharge: 2013-02-02 | Disposition: A | Discharge status: Home / Self Care | Payer: Medicaid Other | Source: Ambulatory Visit | Attending: Obstetrics & Gynecology | Admitting: Obstetrics & Gynecology

## 2013-02-01 ENCOUNTER — Encounter (HOSPITAL_COMMUNITY): Payer: Self-pay | Admitting: *Deleted

## 2013-02-01 DIAGNOSIS — O210 Mild hyperemesis gravidarum: Secondary | ICD-10-CM | POA: Insufficient documentation

## 2013-02-01 DIAGNOSIS — O219 Vomiting of pregnancy, unspecified: Secondary | ICD-10-CM

## 2013-02-01 DIAGNOSIS — O26892 Other specified pregnancy related conditions, second trimester: Secondary | ICD-10-CM

## 2013-02-01 DIAGNOSIS — O99891 Other specified diseases and conditions complicating pregnancy: Secondary | ICD-10-CM | POA: Insufficient documentation

## 2013-02-01 DIAGNOSIS — R51 Headache: Secondary | ICD-10-CM | POA: Insufficient documentation

## 2013-02-01 LAB — URINALYSIS, ROUTINE W REFLEX MICROSCOPIC
Nitrite: NEGATIVE
Protein, ur: NEGATIVE mg/dL
Specific Gravity, Urine: 1.015 (ref 1.005–1.030)
Urobilinogen, UA: 1 mg/dL (ref 0.0–1.0)
pH: 6 (ref 5.0–8.0)

## 2013-02-01 LAB — CBC
Hemoglobin: 9.6 g/dL — ABNORMAL LOW (ref 12.0–15.0)
MCHC: 32.9 g/dL (ref 30.0–36.0)
Platelets: 279 10*3/uL (ref 150–400)
RDW: 16.8 % — ABNORMAL HIGH (ref 11.5–15.5)
WBC: 11.3 10*3/uL — ABNORMAL HIGH (ref 4.0–10.5)

## 2013-02-01 LAB — URINE MICROSCOPIC-ADD ON

## 2013-02-01 MED ORDER — ACETAMINOPHEN 500 MG PO TABS
500.0000 mg | ORAL_TABLET | Freq: Once | ORAL | Status: AC
  Administered 2013-02-01: 500 mg via ORAL
  Filled 2013-02-01: qty 1

## 2013-02-01 MED ORDER — PROMETHAZINE HCL 25 MG PO TABS
12.5000 mg | ORAL_TABLET | Freq: Four times a day (QID) | ORAL | Status: DC | PRN
  Administered 2013-02-01: 12.5 mg via ORAL
  Filled 2013-02-01: qty 1

## 2013-02-01 NOTE — MAU Note (Signed)
Patient states she had vomiting that started today. Been feeling light headed, dizzy, and short of breath.

## 2013-02-01 NOTE — MAU Provider Note (Signed)
History     CSN: [id number]  Arrival date and time: 02/01/13 2200   First Provider Initiated Contact with Patient 02/01/13 2316      Chief Complaint  Patient presents with  . Emesis During Pregnancy   HPI  Pt is a [id number] here at 22.0 wks IUP with report of vomiting that started today.  No report of fever, body aches, or chills. Pt reports saw blood in vomit today.  Also states have been feeling lightheaded and short of breath.    Past Medical History  Diagnosis Date  . Genital HSV     Last OB: > 60yr ago; Valtrex 500 mg po prn outbreaks  . Anxiety   . Thyroid disease   . Panic attack   . Mood swings   . Headache   . Chlamydia   . Trichomonas   . Infection     Past Surgical History  Procedure Laterality Date  . Induced abortion  2009  . Vaginal delivery  2007  . Breast surgery      Family History  Problem Relation Age of Onset  . Breast cancer Other   . Depression Mother   . Anxiety disorder Mother   . Bipolar disorder Brother   . Diabetes Maternal Grandmother   . Cancer Maternal Grandmother   . Hypertension Maternal Grandmother   . Hypertension Paternal Grandmother   . Autism    . Schizophrenia      History  Substance Use Topics  . Smoking status: Never Smoker   . Smokeless tobacco: Never Used  . Alcohol Use: No     Comment: occas    Allergies:  Allergies  Allergen Reactions  . Zofran (Ondansetron Hcl) Nausea And Vomiting    Prescriptions prior to admission  Medication Sig Dispense Refill  . acetaminophen (TYLENOL) 500 MG tablet Take 1,000 mg by mouth every 6 (six) hours as needed for pain.      . Prenatal Vit-Fe Fumarate-FA (PRENATAL MULTIVITAMIN) TABS Take 1 tablet by mouth daily at 12 noon.      Marland Kitchen buPROPion (WELLBUTRIN XL) 150 MG 24 hr tablet Take 1 tablet (150 mg total) by mouth daily.  30 tablet  11  . valACYclovir (VALTREX) 1000 MG tablet Take 500 mg by mouth daily as needed. For outbreaks        Review of Systems  Respiratory: Positive  for shortness of breath.   Gastrointestinal: Positive for nausea and vomiting.  Neurological: Positive for dizziness.  All other systems reviewed and are negative.   Physical Exam   Blood pressure 120/69, pulse 100, temperature 97.7 F (36.5 C), temperature source Oral, resp. rate 18, height 5\' 7"  (1.702 m), weight 107.593 kg (237 lb 3.2 oz), last menstrual period 08/31/2012. O2sat 100%  Physical Exam  Constitutional: She is oriented to person, place, and time. She appears well-developed and well-nourished. No distress.  HENT:  Head: Normocephalic.  Neck: Normal range of motion. Neck supple.  Cardiovascular: Normal rate, regular rhythm and normal heart sounds.   Respiratory: Effort normal and breath sounds normal.  GI: Soft. There is no tenderness.  Genitourinary: No bleeding around the vagina.  Musculoskeletal: Normal range of motion.  Neurological: She is alert and oriented to person, place, and time.  Skin: Skin is warm and dry.    MAU Course  Procedures  Results for orders placed during the hospital encounter of 02/01/13 (from the past 24 hour(s))  URINALYSIS, ROUTINE W REFLEX MICROSCOPIC     Status: Abnormal  Collection Time    02/01/13 10:13 PM      Result Value Range   Color, Urine YELLOW  YELLOW   APPearance CLEAR  CLEAR   Specific Gravity, Urine 1.015  1.005 - 1.030   pH 6.0  5.0 - 8.0   Glucose, UA NEGATIVE  NEGATIVE mg/dL   Hgb urine dipstick NEGATIVE  NEGATIVE   Bilirubin Urine NEGATIVE  NEGATIVE   Ketones, ur NEGATIVE  NEGATIVE mg/dL   Protein, ur NEGATIVE  NEGATIVE mg/dL   Urobilinogen, UA 1.0  0.0 - 1.0 mg/dL   Nitrite NEGATIVE  NEGATIVE   Leukocytes, UA TRACE (*) NEGATIVE  URINE MICROSCOPIC-ADD ON     Status: None   Collection Time    02/01/13 10:13 PM      Result Value Range   Squamous Epithelial / LPF RARE  RARE   WBC, UA 0-2  <3 WBC/hpf  CBC     Status: Abnormal   Collection Time    02/01/13 11:27 PM      Result Value Range   WBC 11.3 (*) 4.0  - 10.5 K/uL   RBC 4.08  3.87 - 5.11 MIL/uL   Hemoglobin 9.6 (*) 12.0 - 15.0 g/dL   HCT 16.1 (*) 09.6 - 04.5 %   MCV 71.6 (*) 78.0 - 100.0 fL   MCH 23.5 (*) 26.0 - 34.0 pg   MCHC 32.9  30.0 - 36.0 g/dL   RDW 40.9 (*) 81.1 - 91.4 %   Platelets 279  150 - 400 K/uL  COMPREHENSIVE METABOLIC PANEL     Status: Abnormal   Collection Time    02/01/13 11:27 PM      Result Value Range   Sodium 132 (*) 135 - 145 mEq/L   Potassium 3.7  3.5 - 5.1 mEq/L   Chloride 100  96 - 112 mEq/L   CO2 23  19 - 32 mEq/L   Glucose, Bld 89  70 - 99 mg/dL   BUN 7  6 - 23 mg/dL   Creatinine, Ser 7.82 (*) 0.50 - 1.10 mg/dL   Calcium 9.4  8.4 - 95.6 mg/dL   Total Protein 7.0  6.0 - 8.3 g/dL   Albumin 3.0 (*) 3.5 - 5.2 g/dL   AST 14  0 - 37 U/L   ALT 17  0 - 35 U/L   Alkaline Phosphatase 53  39 - 117 U/L   Total Bilirubin 0.3  0.3 - 1.2 mg/dL   GFR calc non Af Amer >90  >90 mL/min   GFR calc Af Amer >90  >90 mL/min   PO Phenergan Tylenol for headache 0115 Pt reports improvement in symptoms and desires discharge home.  Assessment and Plan  Headache in Pregnancy Nausea and vomiting in pregnancy  Plan: DC to home Encouraged PO fluids Keep scheduled appt  [facility] 02/01/2013, 11:18 PM

## 2013-02-02 DIAGNOSIS — O219 Vomiting of pregnancy, unspecified: Secondary | ICD-10-CM

## 2013-02-02 LAB — COMPREHENSIVE METABOLIC PANEL
ALT: 17 U/L (ref 0–35)
AST: 14 U/L (ref 0–37)
Albumin: 3 g/dL — ABNORMAL LOW (ref 3.5–5.2)
Alkaline Phosphatase: 53 U/L (ref 39–117)
BUN: 7 mg/dL (ref 6–23)
CO2: 23 mEq/L (ref 19–32)
Chloride: 100 mEq/L (ref 96–112)
Creatinine, Ser: 0.45 mg/dL — ABNORMAL LOW (ref 0.50–1.10)
GFR calc non Af Amer: >90 mL/min (ref >90)
Glucose, Bld: 89 mg/dL (ref 70–99)
Potassium: 3.7 mEq/L (ref 3.5–5.1)
Sodium: 132 mEq/L — ABNORMAL LOW (ref 135–145)
Total Protein: 7 g/dL (ref 6.0–8.3)

## 2013-02-16 ENCOUNTER — Other Ambulatory Visit: Payer: Medicaid Other

## 2013-02-16 ENCOUNTER — Encounter: Payer: Medicaid Other | Admitting: Obstetrics

## 2013-02-16 ENCOUNTER — Encounter: Payer: Self-pay | Admitting: Obstetrics

## 2013-02-16 ENCOUNTER — Encounter: Payer: Self-pay | Admitting: Obstetrics & Gynecology

## 2013-02-18 ENCOUNTER — Inpatient Hospital Stay (HOSPITAL_COMMUNITY)
Admission: AD | Admit: 2013-02-18 | Discharge: 2013-02-18 | Disposition: A | Discharge status: Home / Self Care | Payer: Medicaid Other | Source: Ambulatory Visit | Attending: Obstetrics | Admitting: Obstetrics

## 2013-02-18 ENCOUNTER — Encounter (HOSPITAL_COMMUNITY): Payer: Self-pay | Admitting: *Deleted

## 2013-02-18 DIAGNOSIS — M79609 Pain in unspecified limb: Secondary | ICD-10-CM

## 2013-02-18 DIAGNOSIS — O99891 Other specified diseases and conditions complicating pregnancy: Secondary | ICD-10-CM | POA: Insufficient documentation

## 2013-02-18 DIAGNOSIS — M549 Dorsalgia, unspecified: Secondary | ICD-10-CM

## 2013-02-18 DIAGNOSIS — N949 Unspecified condition associated with female genital organs and menstrual cycle: Secondary | ICD-10-CM | POA: Insufficient documentation

## 2013-02-18 LAB — URINALYSIS, ROUTINE W REFLEX MICROSCOPIC
Bilirubin Urine: NEGATIVE
Glucose, UA: NEGATIVE mg/dL
Hgb urine dipstick: NEGATIVE
Nitrite: NEGATIVE
Protein, ur: NEGATIVE mg/dL
Urobilinogen, UA: 1 mg/dL (ref 0.0–1.0)

## 2013-02-18 LAB — GC/CHLAMYDIA PROBE AMP
CT Probe RNA: NEGATIVE
GC Probe RNA: NEGATIVE

## 2013-02-18 LAB — WET PREP, GENITAL: Yeast Wet Prep HPF POC: NONE SEEN

## 2013-02-18 MED ORDER — CYCLOBENZAPRINE HCL 10 MG PO TABS
10.0000 mg | ORAL_TABLET | Freq: Once | ORAL | Status: AC
  Administered 2013-02-18: 10 mg via ORAL
  Filled 2013-02-18: qty 1

## 2013-02-18 MED ORDER — PROMETHAZINE HCL 25 MG PO TABS: 12.5000 mg | ORAL_TABLET | Freq: Once | ORAL | Status: DC

## 2013-02-18 MED ORDER — ACETAMINOPHEN 325 MG PO TABS
650.0000 mg | ORAL_TABLET | Freq: Once | ORAL | Status: AC
  Administered 2013-02-18: 650 mg via ORAL
  Filled 2013-02-18: qty 2

## 2013-02-18 NOTE — MAU Note (Signed)
Pt complaining of headache at this time.

## 2013-02-18 NOTE — Progress Notes (Signed)
Pt states she feel light headed

## 2013-02-18 NOTE — MAU Note (Signed)
Pt states she started having pain in her lower pelvic area about1800. Pt is also in her legs

## 2013-02-18 NOTE — Progress Notes (Signed)
Pt states she had been taking medication prn prior to finding out she was pregnant

## 2013-02-18 NOTE — MAU Provider Note (Signed)
History     CSN: [id number]  Arrival date and time: 02/18/13 0019   First Provider Initiated Contact with Patient 02/18/13 0103      Chief Complaint  Patient presents with  . Pelvic Pain   HPI  Melinda Jimenez is a 27 y.o. [phone] at [redacted]w[redacted]d who presents today with back, hip and leg pain since about 1700 today. She denies any UCs, VB or LOF and confirms fetal movement at the time of presentation. She has had two previous full term vaginal deliveries after IOL 2/2 post-dates.   Past Medical History  Diagnosis Date  . Genital HSV     Last OB: > 81yr ago; Valtrex 500 mg po prn outbreaks  . Anxiety   . Thyroid disease   . Panic attack   . Mood swings   . Headache   . Chlamydia   . Trichomonas   . Infection     Past Surgical History  Procedure Laterality Date  . Induced abortion  2009  . Vaginal delivery  2007  . Breast surgery      Family History  Problem Relation Age of Onset  . Breast cancer Other   . Depression Mother   . Anxiety disorder Mother   . Bipolar disorder Brother   . Diabetes Maternal Grandmother   . Cancer Maternal Grandmother   . Hypertension Maternal Grandmother   . Hypertension Paternal Grandmother   . Autism    . Schizophrenia      History  Substance Use Topics  . Smoking status: Never Smoker   . Smokeless tobacco: Never Used  . Alcohol Use: No     Comment: occas    Allergies:  Allergies  Allergen Reactions  . Zofran (Ondansetron Hcl) Nausea And Vomiting    Prescriptions prior to admission  Medication Sig Dispense Refill  . acetaminophen (TYLENOL) 500 MG tablet Take 1,000 mg by mouth every 6 (six) hours as needed for pain.      Marland Kitchen buPROPion (WELLBUTRIN XL) 150 MG 24 hr tablet Take 1 tablet (150 mg total) by mouth daily.  30 tablet  11  . Prenatal Vit-Fe Fumarate-FA (PRENATAL MULTIVITAMIN) TABS Take 1 tablet by mouth daily at 12 noon.      . valACYclovir (VALTREX) 1000 MG tablet Take 500 mg by mouth daily as needed. For outbreaks         Review of Systems  Constitutional: Negative for fever and chills.  Eyes: Negative for blurred vision.  Gastrointestinal: Positive for nausea. Negative for vomiting, abdominal pain, diarrhea, constipation and blood in stool.  Genitourinary: Negative for dysuria, urgency and frequency.  Musculoskeletal: Negative for myalgias.  Neurological: Negative for dizziness and headaches.   Physical Exam   Blood pressure 115/69, pulse 106, temperature 97.1 F (36.2 C), temperature source Oral, resp. rate 18, last menstrual period 08/31/2012.  Physical Exam  Nursing note and vitals reviewed. Constitutional: She appears well-developed and well-nourished. No distress.  Cardiovascular: Normal rate.   Respiratory: Effort normal.  GI: There is no tenderness.  Genitourinary:   External: no lesion Vagina: small amount of white discharge Cervix: pink, smooth, closed/thick/high Uterus: AGA FHT: 145, moderate with 15x15 accels, no decels Toco: no UCs  Skin: Skin is warm and dry.  Psychiatric: She has a normal mood and affect.    MAU Course  Procedures   Results for orders placed during the hospital encounter of 02/18/13 (from the past 24 hour(s))  WET PREP, GENITAL     Status: Abnormal  Collection Time    02/18/13  1:14 AM      Result Value Range   Yeast Wet Prep HPF POC NONE SEEN  NONE SEEN   Trich, Wet Prep NONE SEEN  NONE SEEN   Clue Cells Wet Prep HPF POC NONE SEEN  NONE SEEN   WBC, Wet Prep HPF POC MODERATE (*) NONE SEEN  URINALYSIS, ROUTINE W REFLEX MICROSCOPIC     Status: None   Collection Time    02/18/13  1:20 AM      Result Value Range   Color, Urine YELLOW  YELLOW   APPearance CLEAR  CLEAR   Specific Gravity, Urine 1.020  1.005 - 1.030   pH 6.0  5.0 - 8.0   Glucose, UA NEGATIVE  NEGATIVE mg/dL   Hgb urine dipstick NEGATIVE  NEGATIVE   Bilirubin Urine NEGATIVE  NEGATIVE   Ketones, ur NEGATIVE  NEGATIVE mg/dL   Protein, ur NEGATIVE  NEGATIVE mg/dL   Urobilinogen, UA  1.0  0.0 - 1.0 mg/dL   Nitrite NEGATIVE  NEGATIVE   Leukocytes, UA NEGATIVE  NEGATIVE    Assessment and Plan   1. Back pain complicating pregnancy in second trimester    Preterm labor danger signs reviewed FU with Dr. Clearance Coots as scheduled  Tawnya Crook 02/18/2013, 1:12 AM

## 2013-02-19 ENCOUNTER — Encounter: Payer: Medicaid Other | Admitting: Obstetrics

## 2013-02-19 ENCOUNTER — Other Ambulatory Visit: Payer: Medicaid Other

## 2013-02-24 ENCOUNTER — Encounter: Payer: Self-pay | Admitting: Obstetrics

## 2013-02-26 ENCOUNTER — Encounter (HOSPITAL_COMMUNITY): Payer: Self-pay

## 2013-02-26 ENCOUNTER — Inpatient Hospital Stay (HOSPITAL_COMMUNITY)
Admission: AD | Admit: 2013-02-26 | Discharge: 2013-02-26 | Disposition: A | Discharge status: Home / Self Care | Payer: Medicaid Other | Source: Ambulatory Visit | Attending: Obstetrics | Admitting: Obstetrics

## 2013-02-26 DIAGNOSIS — A599 Trichomoniasis, unspecified: Secondary | ICD-10-CM

## 2013-02-26 DIAGNOSIS — IMO0002 Reserved for concepts with insufficient information to code with codable children: Secondary | ICD-10-CM | POA: Insufficient documentation

## 2013-02-26 DIAGNOSIS — F4321 Adjustment disorder with depressed mood: Secondary | ICD-10-CM

## 2013-02-26 DIAGNOSIS — O98819 Other maternal infectious and parasitic diseases complicating pregnancy, unspecified trimester: Secondary | ICD-10-CM | POA: Insufficient documentation

## 2013-02-26 DIAGNOSIS — A5901 Trichomonal vulvovaginitis: Secondary | ICD-10-CM | POA: Insufficient documentation

## 2013-02-26 DIAGNOSIS — N949 Unspecified condition associated with female genital organs and menstrual cycle: Secondary | ICD-10-CM | POA: Insufficient documentation

## 2013-02-26 DIAGNOSIS — R109 Unspecified abdominal pain: Secondary | ICD-10-CM | POA: Insufficient documentation

## 2013-02-26 LAB — WET PREP, GENITAL
Clue Cells Wet Prep HPF POC: NONE SEEN
Yeast Wet Prep HPF POC: NONE SEEN

## 2013-02-26 LAB — URINALYSIS, ROUTINE W REFLEX MICROSCOPIC
Nitrite: NEGATIVE
Specific Gravity, Urine: 1.01 (ref 1.005–1.030)
Urobilinogen, UA: 0.2 mg/dL (ref 0.0–1.0)
pH: 6 (ref 5.0–8.0)

## 2013-02-26 LAB — URINE MICROSCOPIC-ADD ON

## 2013-02-26 MED ORDER — HYDROXYZINE PAMOATE 50 MG PO CAPS: 50.0000 mg | ORAL_CAPSULE | Freq: Every evening | ORAL | Status: DC | PRN

## 2013-02-26 MED ORDER — ACETAMINOPHEN 325 MG PO TABS
650.0000 mg | ORAL_TABLET | Freq: Once | ORAL | Status: AC
  Administered 2013-02-26: 650 mg via ORAL
  Filled 2013-02-26: qty 2

## 2013-02-26 MED ORDER — METRONIDAZOLE 500 MG PO TABS
2000.0000 mg | ORAL_TABLET | Freq: Once | ORAL | Status: AC
  Administered 2013-02-26: 2000 mg via ORAL
  Filled 2013-02-26: qty 4

## 2013-02-26 MED ORDER — HYDROXYZINE HCL 50 MG PO TABS
50.0000 mg | ORAL_TABLET | Freq: Once | ORAL | Status: AC
  Administered 2013-02-26: 50 mg via ORAL
  Filled 2013-02-26: qty 1

## 2013-02-26 MED ORDER — METRONIDAZOLE 500 MG PO TABS: ORAL_TABLET | ORAL | Status: DC

## 2013-02-26 NOTE — MAU Note (Addendum)
Pt complains of pelvic pressure in abdomen. Pt also complains of scant vaginal discharge. Pt states grandmother died and admitted to a lot of activity and upset which could have brought on the abdominal pain

## 2013-02-26 NOTE — MAU Provider Note (Signed)
History     CSN: [id number]  Arrival date and time: 02/26/13 4098   First Provider Initiated Contact with Patient 02/26/13 1935      Chief Complaint  Patient presents with  . Abdominal Pain  . Vaginal Discharge   HPI  Pt is a [id number] here at 25.4 wks with report of pelvic pressure in abdomen. Pt also complains of scant vaginal discharge. Pt states grandmother died and admitted to a lot of activity and upset which could have brought on the abdominal pain.  Pt is concerned due to the increased grief and crying and it's potential impact on pregnancy.  Denies desiring to hurt self or others.  Pt has a therapist that she sees for previously diagnosed depression.     Past Medical History  Diagnosis Date  . 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 Anxiety   . Thyroid disease   . Panic attack   . Mood swings   . Headache(784.0)   . Chlamydia   . Trichomonas   . Infection     Past Surgical History  Procedure Laterality Date  . Induced abortion  2009  . Vaginal delivery  2007  . Breast surgery      Family History  Problem Relation Age of Onset  . Breast cancer Other   . Depression Mother   . Anxiety disorder Mother   . Bipolar disorder Brother   . Diabetes Maternal Grandmother   . Cancer Maternal Grandmother   . Hypertension Maternal Grandmother   . Hypertension Paternal Grandmother   . Autism    . Schizophrenia      History  Substance Use Topics  . Smoking status: Former Games developer  . Smokeless tobacco: Never Used  . Alcohol Use: No     Comment: occas    Allergies:  Allergies  Allergen Reactions  . Zofran (Ondansetron Hcl) Nausea And Vomiting    Prescriptions prior to admission  Medication Sig Dispense Refill  . acetaminophen (TYLENOL) 500 MG tablet Take 1,000 mg by mouth every 6 (six) hours as needed for pain.      . Prenatal Vit-Fe Fumarate-FA (PRENATAL MULTIVITAMIN) TABS Take 1 tablet by mouth daily at 12 noon.      .  ranitidine (ZANTAC) 150 MG tablet Take 150 mg by mouth once.      . valACYclovir (VALTREX) 1000 MG tablet Take 500 mg by mouth daily as needed. For outbreaks      . buPROPion (WELLBUTRIN XL) 150 MG 24 hr tablet Take 1 tablet (150 mg total) by mouth daily.  30 tablet  11    Review of Systems  Eyes: Negative for blurred vision.  Cardiovascular: Negative for chest pain.  Gastrointestinal: Positive for abdominal pain (cramping).  Genitourinary:       Vaginal discharge; pelvic pressure  Neurological: Positive for headaches.  Psychiatric/Behavioral: Positive for depression. Negative for suicidal ideas. The patient is nervous/anxious.   All other systems reviewed and are negative.   Physical Exam   Last menstrual period 08/31/2012.  Physical Exam  Constitutional: She is oriented to person, place, and time. She appears well-developed and well-nourished. No distress.  HENT:  Head: Normocephalic.  Neck: Normal range of motion. Neck supple.  Cardiovascular: Normal rate, regular rhythm and normal heart sounds.   Respiratory: Effort normal and breath sounds normal.  GI: Soft.  Genitourinary: No bleeding around the vagina. Vaginal discharge (creamy; no pooling of fluid) found.  Neurological: She is alert and oriented to person, place, and time.  Skin: Skin is warm and dry.    Cervix - closed/thick  FHR 130's, +accels, reactive Toco - irritability  MAU Course  Procedures  Results for orders placed during the hospital encounter of 02/26/13 (from the past 24 hour(s))  URINALYSIS, ROUTINE W REFLEX MICROSCOPIC     Status: Abnormal   Collection Time    02/26/13  7:55 PM      Result Value Range   Color, Urine YELLOW  YELLOW   APPearance CLEAR  CLEAR   Specific Gravity, Urine 1.010  1.005 - 1.030   pH 6.0  5.0 - 8.0   Glucose, UA NEGATIVE  NEGATIVE mg/dL   Hgb urine dipstick NEGATIVE  NEGATIVE   Bilirubin Urine NEGATIVE  NEGATIVE   Ketones, ur NEGATIVE  NEGATIVE mg/dL   Protein, ur  NEGATIVE  NEGATIVE mg/dL   Urobilinogen, UA 0.2  0.0 - 1.0 mg/dL   Nitrite NEGATIVE  NEGATIVE   Leukocytes, UA TRACE (*) NEGATIVE  URINE MICROSCOPIC-ADD ON     Status: Abnormal   Collection Time    02/26/13  7:55 PM      Result Value Range   Squamous Epithelial / LPF FEW (*) RARE   WBC, UA 3-6  <3 WBC/hpf   Bacteria, UA RARE  RARE  WET PREP, GENITAL     Status: Abnormal   Collection Time    02/26/13  7:58 PM      Result Value Range   Yeast Wet Prep HPF POC NONE SEEN  NONE SEEN   Trich, Wet Prep FEW (*) NONE SEEN   Clue Cells Wet Prep HPF POC NONE SEEN  NONE SEEN   WBC, Wet Prep HPF POC MODERATE (*) NONE SEEN    Assessment and Plan  Trichomoniasis Emotional Distress Category I FHR Tracing  Plan: RX Flagyl 2 gm PO Vistaril 50 mg PO in MAU RX Vistaril 50mg  #10  [facility] 02/26/2013, 7:38 PM

## 2013-02-28 ENCOUNTER — Encounter: Payer: Self-pay | Admitting: Obstetrics

## 2013-03-05 ENCOUNTER — Ambulatory Visit (INDEPENDENT_AMBULATORY_CARE_PROVIDER_SITE_OTHER): Payer: Medicaid Other | Admitting: Obstetrics

## 2013-03-05 ENCOUNTER — Other Ambulatory Visit: Payer: Medicaid Other

## 2013-03-05 VITALS — BP 114/78 | Wt 235.0 lb

## 2013-03-05 DIAGNOSIS — A6000 Herpesviral infection of urogenital system, unspecified: Secondary | ICD-10-CM

## 2013-03-05 DIAGNOSIS — Z3482 Encounter for supervision of other normal pregnancy, second trimester: Secondary | ICD-10-CM

## 2013-03-05 DIAGNOSIS — Z3480 Encounter for supervision of other normal pregnancy, unspecified trimester: Secondary | ICD-10-CM

## 2013-03-05 DIAGNOSIS — Z3483 Encounter for supervision of other normal pregnancy, third trimester: Secondary | ICD-10-CM

## 2013-03-05 LAB — POCT URINALYSIS DIPSTICK
Blood, UA: NEGATIVE
Glucose, UA: NEGATIVE
Ketones, UA: NEGATIVE
Leukocytes, UA: NEGATIVE
Nitrite, UA: NEGATIVE
Spec Grav, UA: 1.02
Urobilinogen, UA: NEGATIVE

## 2013-03-05 LAB — CBC
MCH: 22.6 pg — ABNORMAL LOW (ref 26.0–34.0)
MCHC: 32.3 g/dL (ref 30.0–36.0)
MCV: 69.8 fL — ABNORMAL LOW (ref 78.0–100.0)
Platelets: 308 10*3/uL (ref 150–400)
RBC: 4.61 MIL/uL (ref 3.87–5.11)

## 2013-03-05 MED ORDER — VALACYCLOVIR HCL 1 G PO TABS: 1000.0000 mg | ORAL_TABLET | Freq: Two times a day (BID) | ORAL | Status: DC

## 2013-03-05 NOTE — Addendum Note (Signed)
Addended by: George Hugh on: 03/05/2013 10:58 AM   Modules accepted: Orders

## 2013-03-05 NOTE — Progress Notes (Signed)
P 103 patirnt c/o some swelling in feet that comes and goes. Patient has some lower discomfort at times.

## 2013-03-06 LAB — GLUCOSE TOLERANCE, 2 HOURS W/ 1HR: Glucose, Fasting: 110 mg/dL — ABNORMAL HIGH (ref 70–99)

## 2013-03-06 LAB — HIV ANTIBODY (ROUTINE TESTING W REFLEX): HIV: NONREACTIVE

## 2013-03-06 LAB — RPR

## 2013-03-23 ENCOUNTER — Encounter: Payer: Medicaid Other | Admitting: Obstetrics

## 2013-03-27 ENCOUNTER — Ambulatory Visit (INDEPENDENT_AMBULATORY_CARE_PROVIDER_SITE_OTHER): Payer: Medicaid Other | Admitting: Obstetrics

## 2013-03-27 VITALS — BP 96/65 | Temp 98.1°F | Wt 235.4 lb

## 2013-03-27 DIAGNOSIS — O24919 Unspecified diabetes mellitus in pregnancy, unspecified trimester: Secondary | ICD-10-CM

## 2013-03-27 DIAGNOSIS — Z3480 Encounter for supervision of other normal pregnancy, unspecified trimester: Secondary | ICD-10-CM

## 2013-03-27 DIAGNOSIS — O24913 Unspecified diabetes mellitus in pregnancy, third trimester: Secondary | ICD-10-CM

## 2013-03-27 DIAGNOSIS — Z3483 Encounter for supervision of other normal pregnancy, third trimester: Secondary | ICD-10-CM

## 2013-03-27 LAB — POCT URINALYSIS DIPSTICK
Bilirubin, UA: NEGATIVE
Blood, UA: NEGATIVE
Ketones, UA: NEGATIVE
Leukocytes, UA: NEGATIVE
Spec Grav, UA: 1.02
Urobilinogen, UA: NEGATIVE

## 2013-03-27 NOTE — Progress Notes (Signed)
Pulse: 103

## 2013-03-28 ENCOUNTER — Encounter: Payer: Self-pay | Admitting: Obstetrics

## 2013-03-30 ENCOUNTER — Encounter (HOSPITAL_COMMUNITY): Payer: Self-pay | Admitting: Obstetrics

## 2013-04-02 ENCOUNTER — Encounter: Payer: Medicaid Other | Admitting: Obstetrics

## 2013-04-05 ENCOUNTER — Encounter (HOSPITAL_COMMUNITY): Payer: Self-pay | Admitting: *Deleted

## 2013-04-05 ENCOUNTER — Inpatient Hospital Stay (HOSPITAL_COMMUNITY)
Admission: AD | Admit: 2013-04-05 | Discharge: 2013-04-06 | Disposition: A | Discharge status: Home / Self Care | Payer: Medicaid Other | Source: Ambulatory Visit | Attending: Obstetrics | Admitting: Obstetrics

## 2013-04-05 DIAGNOSIS — D649 Anemia, unspecified: Secondary | ICD-10-CM | POA: Insufficient documentation

## 2013-04-05 DIAGNOSIS — H538 Other visual disturbances: Secondary | ICD-10-CM | POA: Insufficient documentation

## 2013-04-05 DIAGNOSIS — R51 Headache: Secondary | ICD-10-CM | POA: Insufficient documentation

## 2013-04-05 DIAGNOSIS — O99019 Anemia complicating pregnancy, unspecified trimester: Secondary | ICD-10-CM | POA: Insufficient documentation

## 2013-04-05 DIAGNOSIS — O99891 Other specified diseases and conditions complicating pregnancy: Secondary | ICD-10-CM | POA: Insufficient documentation

## 2013-04-05 LAB — URINALYSIS, ROUTINE W REFLEX MICROSCOPIC
Glucose, UA: NEGATIVE mg/dL
Hgb urine dipstick: NEGATIVE
Ketones, ur: NEGATIVE mg/dL
Leukocytes, UA: NEGATIVE
Nitrite: NEGATIVE
pH: 6 (ref 5.0–8.0)

## 2013-04-05 LAB — CBC
HCT: 29.4 % — ABNORMAL LOW (ref 36.0–46.0)
Hemoglobin: 9.5 g/dL — ABNORMAL LOW (ref 12.0–15.0)
RBC: 4.2 MIL/uL (ref 3.87–5.11)
WBC: 11.8 10*3/uL — ABNORMAL HIGH (ref 4.0–10.5)

## 2013-04-05 LAB — GLUCOSE, CAPILLARY

## 2013-04-05 MED ORDER — METOCLOPRAMIDE HCL 5 MG/ML IJ SOLN
10.0000 mg | Freq: Once | INTRAMUSCULAR | Status: AC
  Administered 2013-04-05: 10 mg via INTRAVENOUS
  Filled 2013-04-05: qty 2

## 2013-04-05 MED ORDER — DEXAMETHASONE SODIUM PHOSPHATE 10 MG/ML IJ SOLN
10.0000 mg | Freq: Once | INTRAMUSCULAR | Status: AC
  Administered 2013-04-05: 10 mg via INTRAVENOUS
  Filled 2013-04-05: qty 1

## 2013-04-05 MED ORDER — SODIUM CHLORIDE 0.9 % IV SOLN
INTRAVENOUS | Status: DC
  Administered 2013-04-05: via INTRAVENOUS

## 2013-04-05 MED ORDER — DIPHENHYDRAMINE HCL 50 MG/ML IJ SOLN
25.0000 mg | Freq: Once | INTRAMUSCULAR | Status: AC
  Administered 2013-04-05: 25 mg via INTRAVENOUS
  Filled 2013-04-05: qty 1

## 2013-04-05 NOTE — MAU Provider Note (Signed)
History     CSN: [id number]  Arrival date and time: 04/05/13 2215   First Provider Initiated Contact with Patient 04/05/13 2308      Chief Complaint  Patient presents with  . Headache  . Blurred Vision   HPI [name] is a 27 y.o. [phone] at [redacted]w[redacted]d who presents to MAU today with complaint of headache and blurred vision. The patient states that pain is severe and has been presents x 4 days. She has also had occasional N/V without diarrhea or constipation. She has mild LE edema that is usually relieved with rest. She denies abdominal pain, vaginal bleeding, discharge, LOF or contractions. She denies any history of HTN in pregnancy or otherwise. She does have GDM. She is in twice weekly NST testing, but missed her second appointment last week. She reports good fetal movement.   OB History   Grav Para Term Preterm Abortions TAB SAB Ect Mult Living   7 2 2  4 2 2   1       Past Medical History  Diagnosis Date  . Genital HSV     Last OB: > 87yr ago; Valtrex 500 mg po prn outbreaks  . Anxiety   . Thyroid disease   . Panic attack   . Mood swings   . Headache(784.0)   . Chlamydia   . Trichomonas   . Infection     Past Surgical History  Procedure Laterality Date  . Induced abortion  2009  . Vaginal delivery  2007  . Breast surgery      Family History  Problem Relation Age of Onset  . Breast cancer Other   . Depression Mother   . Anxiety disorder Mother   . Bipolar disorder Brother   . Diabetes Maternal Grandmother   . Cancer Maternal Grandmother   . Hypertension Maternal Grandmother   . Hypertension Paternal Grandmother   . Autism    . Schizophrenia      History  Substance Use Topics  . Smoking status: Former Games developer  . Smokeless tobacco: Never Used  . Alcohol Use: No     Comment: occas    Allergies:  Allergies  Allergen Reactions  . Zofran (Ondansetron Hcl) Nausea And Vomiting    Prescriptions prior to admission  Medication Sig Dispense Refill  .  acetaminophen (TYLENOL) 500 MG tablet Take 1,000 mg by mouth every 6 (six) hours as needed for pain.      Marland Kitchen buPROPion (WELLBUTRIN XL) 150 MG 24 hr tablet Take 1 tablet (150 mg total) by mouth daily.  30 tablet  11  . hydrOXYzine (VISTARIL) 50 MG capsule Take 1 capsule (50 mg total) by mouth at bedtime as needed for itching.  10 capsule  0  . Prenatal Vit-Fe Fumarate-FA (PRENATAL MULTIVITAMIN) TABS Take 1 tablet by mouth daily at 12 noon.      . ranitidine (ZANTAC) 150 MG tablet Take 150 mg by mouth once.      . valACYclovir (VALTREX) 1000 MG tablet Take 1 tablet (1,000 mg total) by mouth 2 (two) times daily.  40 tablet  prn    Review of Systems  Constitutional: Negative for fever and malaise/fatigue.  Eyes: Positive for blurred vision.  Gastrointestinal: Positive for nausea and vomiting. Negative for abdominal pain, diarrhea and constipation.  Genitourinary: Negative for dysuria, urgency and frequency.       Neg - vaginal bleeding, discharge, LOF  Neurological: Positive for headaches. Negative for dizziness.   Physical Exam   Blood  pressure 121/74, pulse 99, temperature 97.2 F (36.2 C), temperature source Oral, resp. rate 18, height 5\' 7"  (1.702 m), weight 235 lb (106.595 kg), last menstrual period 08/31/2012.  Physical Exam  Constitutional: She is oriented to person, place, and time. She appears well-developed and well-nourished. No distress.  HENT:  Head: Normocephalic and atraumatic.  Cardiovascular: Normal rate, regular rhythm and normal heart sounds.   Respiratory: Effort normal and breath sounds normal. No respiratory distress.  GI: Soft. Bowel sounds are normal. She exhibits no distension and no mass. There is no tenderness. There is no rebound and no guarding.  Musculoskeletal: She exhibits no edema and no tenderness.  Neurological: She is alert and oriented to person, place, and time. She has normal reflexes.  No clonus  Skin: Skin is warm and dry. No erythema.  Psychiatric:  She has a normal mood and affect.   Results for orders placed during the hospital encounter of 04/05/13 (from the past 24 hour(s))  URINALYSIS, ROUTINE W REFLEX MICROSCOPIC     Status: Abnormal   Collection Time    04/05/13 10:20 PM      Result Value Range   Color, Urine YELLOW  YELLOW   APPearance CLEAR  CLEAR   Specific Gravity, Urine >1.030 (*) 1.005 - 1.030   pH 6.0  5.0 - 8.0   Glucose, UA NEGATIVE  NEGATIVE mg/dL   Hgb urine dipstick NEGATIVE  NEGATIVE   Bilirubin Urine NEGATIVE  NEGATIVE   Ketones, ur NEGATIVE  NEGATIVE mg/dL   Protein, ur NEGATIVE  NEGATIVE mg/dL   Urobilinogen, UA 1.0  0.0 - 1.0 mg/dL   Nitrite NEGATIVE  NEGATIVE   Leukocytes, UA NEGATIVE  NEGATIVE  CBC     Status: Abnormal   Collection Time    04/05/13 11:30 PM      Result Value Range   WBC 11.8 (*) 4.0 - 10.5 K/uL   RBC 4.20  3.87 - 5.11 MIL/uL   Hemoglobin 9.5 (*) 12.0 - 15.0 g/dL   HCT 16.1 (*) 09.6 - 04.5 %   MCV 70.0 (*) 78.0 - 100.0 fL   MCH 22.6 (*) 26.0 - 34.0 pg   MCHC 32.3  30.0 - 36.0 g/dL   RDW 40.9 (*) 81.1 - 91.4 %   Platelets 254  150 - 400 K/uL  GLUCOSE, CAPILLARY     Status: Abnormal   Collection Time    04/05/13 11:33 PM      Result Value Range   Glucose-Capillary 120 (*) 70 - 99 mg/dL   Comment 1 Notify RN     Fetal monitoring: Baseline: 130 bpm, moderate variability, + accelerations, no decelerations.  Contractions: none  MAU Course  Procedures None  MDM UA, CBC, CBG, orthostatic vital signs today IV NS with 25 mg Benadryl, 10 mg Decadron and 10 mg Reglan given for headache Patient is mildly anemic, most likely not a source of feeling faint No evidence of hypoglycemia 0049 - patient reports resolution of symptoms.   Assessment and Plan  A: Headache  P: Discharge home Patient advised to increase PO hydration as tolerated Patient encouraged to keep appointment with Dr. Clearance Coots as scheduled for this week Patient may return to MAU as needed or if her condition  were to change or worsen   Freddi Starr, PA-C  04/06/2013, 12:48 AM

## 2013-04-05 NOTE — MAU Note (Signed)
C/o headache and blurred vision for past 4 days;

## 2013-04-06 DIAGNOSIS — R51 Headache: Secondary | ICD-10-CM

## 2013-04-06 DIAGNOSIS — O9989 Other specified diseases and conditions complicating pregnancy, childbirth and the puerperium: Secondary | ICD-10-CM

## 2013-04-09 ENCOUNTER — Encounter: Payer: Medicaid Other | Admitting: Obstetrics

## 2013-04-14 ENCOUNTER — Other Ambulatory Visit: Payer: Self-pay | Admitting: Obstetrics

## 2013-04-14 DIAGNOSIS — O24919 Unspecified diabetes mellitus in pregnancy, unspecified trimester: Secondary | ICD-10-CM

## 2013-04-14 DIAGNOSIS — Z0489 Encounter for examination and observation for other specified reasons: Secondary | ICD-10-CM

## 2013-04-15 ENCOUNTER — Encounter: Payer: Self-pay | Admitting: Obstetrics

## 2013-04-15 ENCOUNTER — Encounter: Payer: Medicaid Other | Admitting: Obstetrics

## 2013-04-15 LAB — US OB DETAIL + 14 WK

## 2013-04-16 ENCOUNTER — Encounter: Payer: Medicaid Other | Attending: Obstetrics | Admitting: *Deleted

## 2013-04-16 ENCOUNTER — Ambulatory Visit (HOSPITAL_COMMUNITY)
Admission: RE | Admit: 2013-04-16 | Discharge: 2013-04-16 | Disposition: A | Discharge status: Home / Self Care | Payer: Medicaid Other | Source: Ambulatory Visit | Attending: Obstetrics | Admitting: Obstetrics

## 2013-04-16 VITALS — BP 106/66 | HR 108 | Wt 237.0 lb

## 2013-04-16 DIAGNOSIS — Z1389 Encounter for screening for other disorder: Secondary | ICD-10-CM | POA: Insufficient documentation

## 2013-04-16 DIAGNOSIS — O99810 Abnormal glucose complicating pregnancy: Secondary | ICD-10-CM | POA: Insufficient documentation

## 2013-04-16 DIAGNOSIS — IMO0002 Reserved for concepts with insufficient information to code with codable children: Secondary | ICD-10-CM

## 2013-04-16 DIAGNOSIS — E119 Type 2 diabetes mellitus without complications: Secondary | ICD-10-CM

## 2013-04-16 DIAGNOSIS — Z0489 Encounter for examination and observation for other specified reasons: Secondary | ICD-10-CM

## 2013-04-16 DIAGNOSIS — Z713 Dietary counseling and surveillance: Secondary | ICD-10-CM | POA: Insufficient documentation

## 2013-04-16 DIAGNOSIS — O24919 Unspecified diabetes mellitus in pregnancy, unspecified trimester: Secondary | ICD-10-CM

## 2013-04-16 DIAGNOSIS — O358XX0 Maternal care for other (suspected) fetal abnormality and damage, not applicable or unspecified: Secondary | ICD-10-CM | POA: Insufficient documentation

## 2013-04-16 DIAGNOSIS — Z363 Encounter for antenatal screening for malformations: Secondary | ICD-10-CM | POA: Insufficient documentation

## 2013-04-16 NOTE — Consult Note (Addendum)
To write note regarding Diabetes education following

## 2013-04-16 NOTE — Progress Notes (Signed)
Melinda Jimenez  was seen today for an ultrasound appointment.  See full report in [facility].  Impression: Single IUP at 32 4/7 weeks Recent diagnosis of gestational diabetes Fetal growth is appropriate (44th %tile) No anomalies detected Normal amniotic fluid volume  Recommendations: Recommend follow-up ultrasound examination in 4 weeks for interval growth.  Alpha Gula, MD

## 2013-04-16 NOTE — Consult Note (Addendum)
Pt referred by Dr. Clearance Coots for diabetes counseling/education for GDM. Pt brought her son with her to appt which took a biit of her attention away from the education.  Explained repeatedly how to portion size her carbohydrates with meals and snacks.  Explained the pathophysiology of GDM.  Instructed on how to use a cbg meter using the Reli-on meter. Pt was familiar with this procedure as her grandmother had DM and she use to use the machine on herself.  Pt expressed understanding by method of 'teach back', reviewing meal plans, snack plans and glucose monitoring schedule.  Gave her written materials on glucose monitoring times and target ranges.  Pt to return in the next 2 weeks to evaluate her cbg log book, and address concerns/questions. Pt stated after approximately one hour that she understood the directions. Called her pharmacy with prescription for Accucheck [facility] to [facility] on [address] with strips, lancets and prn refills to check cbg's fasting and 2 hrs post-prandial (4 times/day) Thank you, Lenor Coffin, RN, Diabetes CNS [phone])

## 2013-04-22 ENCOUNTER — Ambulatory Visit (INDEPENDENT_AMBULATORY_CARE_PROVIDER_SITE_OTHER): Payer: Medicaid Other | Admitting: Obstetrics

## 2013-04-22 VITALS — BP 127/81 | Temp 97.4°F | Wt 235.4 lb

## 2013-04-22 DIAGNOSIS — Z3480 Encounter for supervision of other normal pregnancy, unspecified trimester: Secondary | ICD-10-CM

## 2013-04-22 DIAGNOSIS — Z3483 Encounter for supervision of other normal pregnancy, third trimester: Secondary | ICD-10-CM

## 2013-04-22 LAB — POCT URINALYSIS DIPSTICK
Blood, UA: NEGATIVE
Spec Grav, UA: 1.015
pH, UA: 6

## 2013-04-22 NOTE — Progress Notes (Signed)
Pulse-100 Pt c/o "a whole lot" of mucus vaginal discharge this morning. Pt c/o contractions x 1 week yesterday every 2 to 3 minutes for 4 to 5 hours. Pt c/o constipation "just today".

## 2013-04-23 ENCOUNTER — Encounter: Payer: Self-pay | Admitting: Obstetrics

## 2013-04-25 ENCOUNTER — Encounter (HOSPITAL_COMMUNITY): Payer: Self-pay | Admitting: *Deleted

## 2013-04-25 ENCOUNTER — Inpatient Hospital Stay (HOSPITAL_COMMUNITY)
Admission: AD | Admit: 2013-04-25 | Discharge: 2013-04-25 | Disposition: A | Discharge status: Home / Self Care | Payer: Medicaid Other | Source: Ambulatory Visit | Attending: Obstetrics | Admitting: Obstetrics

## 2013-04-25 DIAGNOSIS — K5900 Constipation, unspecified: Secondary | ICD-10-CM | POA: Insufficient documentation

## 2013-04-25 DIAGNOSIS — R1032 Left lower quadrant pain: Secondary | ICD-10-CM | POA: Insufficient documentation

## 2013-04-25 DIAGNOSIS — O4700 False labor before 37 completed weeks of gestation, unspecified trimester: Secondary | ICD-10-CM | POA: Insufficient documentation

## 2013-04-25 DIAGNOSIS — K644 Residual hemorrhoidal skin tags: Secondary | ICD-10-CM | POA: Insufficient documentation

## 2013-04-25 DIAGNOSIS — O228X9 Other venous complications in pregnancy, unspecified trimester: Secondary | ICD-10-CM | POA: Insufficient documentation

## 2013-04-25 HISTORY — DX: Anemia, unspecified: D64.9

## 2013-04-25 HISTORY — DX: Gestational diabetes mellitus in pregnancy, unspecified control: O24.419

## 2013-04-25 LAB — URINALYSIS, ROUTINE W REFLEX MICROSCOPIC
Bilirubin Urine: NEGATIVE
Nitrite: NEGATIVE
Specific Gravity, Urine: 1.025 (ref 1.005–1.030)
Urobilinogen, UA: 0.2 mg/dL (ref 0.0–1.0)

## 2013-04-25 MED ORDER — PHENYLEPH-SHARK LIV OIL-MO-PET 0.25-3-14-71.9 % RE OINT: TOPICAL_OINTMENT | Freq: Two times a day (BID) | RECTAL | Status: DC | PRN

## 2013-04-25 MED ORDER — DOCUSATE SODIUM 100 MG PO CAPS: 100.0000 mg | ORAL_CAPSULE | Freq: Two times a day (BID) | ORAL | Status: DC

## 2013-04-25 NOTE — MAU Provider Note (Signed)
History     CSN: [id number]  Arrival date and time: 04/25/13 1623   First Provider Initiated Contact with Patient 04/25/13 1714      Chief Complaint  Patient presents with  . Rectal Bleeding   HPI [name] is a 27 y.o. [phone] at [redacted]w[redacted]d who presents to MAU today with complaint of blood in stool. The patient states that she called the office yesterday and was told to come in for evaluation. She has been constipated x 3 days. She had a firm BM yesterday, none today. She is having mild LLQ pain and rectal pain associated with BM. She noted some blood in her BM yesterday. She has had some dizziness, but feels that she is hydrating well. She denies headache, vaginal bleeding or LOF. She has occasional contractions and some mucus discharge. She reports good fetal movement.   OB History   Grav Para Term Preterm Abortions TAB SAB Ect Mult Living   7 2 2  4 2 2   1       Past Medical History  Diagnosis Date  . Genital HSV     Last OB: > 46yr ago; Valtrex 500 mg po prn outbreaks  . Thyroid disease   . Mood swings   . Headache(784.0)   . Chlamydia   . Trichomonas   . Anemia   . Gestational diabetes     diet controlled  . Infection     UTI  . Anxiety     off meds with preg  . Panic attack     Past Surgical History  Procedure Laterality Date  . Induced abortion  2009  . Vaginal delivery  2007    Family History  Problem Relation Age of Onset  . Breast cancer Other   . Depression Mother   . Anxiety disorder Mother   . Bipolar disorder Brother   . Diabetes Maternal Grandmother   . Cancer Maternal Grandmother   . Hypertension Maternal Grandmother   . Hypertension Paternal Grandmother   . Autism    . Schizophrenia      History  Substance Use Topics  . Smoking status: Former Games developer  . Smokeless tobacco: Never Used     Comment: was not a daily smoker, tried it  . Alcohol Use: Yes     Comment: occas; not with preg    Allergies:  Allergies  Allergen Reactions   . Zofran (Ondansetron Hcl) Nausea And Vomiting    No prescriptions prior to admission    Review of Systems  Constitutional: Negative for fever.  Gastrointestinal: Positive for abdominal pain, constipation and blood in stool. Negative for nausea, vomiting and diarrhea.  Genitourinary: Negative for dysuria, urgency and frequency.       Neg - vaginal bleeding, LOF + vaginal discharge   Physical Exam   Blood pressure 101/56, pulse 99, temperature 98.1 F (36.7 C), temperature source Oral, resp. rate 18, last menstrual period 08/31/2012.  Physical Exam  Constitutional: She is oriented to person, place, and time. She appears well-developed and well-nourished. No distress.  HENT:  Head: Normocephalic and atraumatic.  Cardiovascular: Normal rate.   Respiratory: Effort normal.  GI: Soft.  Genitourinary: Rectal exam shows external hemorrhoid.  Neurological: She is alert and oriented to person, place, and time.  Skin: Skin is warm and dry. No erythema.  Psychiatric: She has a normal mood and affect.   Results for orders placed during the hospital encounter of 04/25/13 (from the past 24 hour(s))  URINALYSIS, ROUTINE  W REFLEX MICROSCOPIC     Status: None   Collection Time    04/25/13  4:50 PM      Result Value Range   Color, Urine YELLOW  YELLOW   APPearance CLEAR  CLEAR   Specific Gravity, Urine 1.025  1.005 - 1.030   pH 6.5  5.0 - 8.0   Glucose, UA NEGATIVE  NEGATIVE mg/dL   Hgb urine dipstick NEGATIVE  NEGATIVE   Bilirubin Urine NEGATIVE  NEGATIVE   Ketones, ur NEGATIVE  NEGATIVE mg/dL   Protein, ur NEGATIVE  NEGATIVE mg/dL   Urobilinogen, UA 0.2  0.0 - 1.0 mg/dL   Nitrite NEGATIVE  NEGATIVE   Leukocytes, UA NEGATIVE  NEGATIVE    Fetal Monitoring: Baseline: 135 bpm, moderate variability, + accelerations, no decelerations Contractions: none  MAU Course  Procedures None  MDM UA  Assessment and Plan  A: External hemorrhoid,  non-thrombosed Constipation  P: Discharge home Rx for preporation H and colace sent to patient's pharmacy Patient advised to increased PO hydration as tolerated Patient encouraged to keep scheduled follow-up with Dr. Clearance Coots this week Patient may return to MAU as needed or if her condition were to change or worsen  Freddi Starr, PA-C  04/25/2013, 5:54 PM

## 2013-04-25 NOTE — MAU Note (Signed)
Was told to come in yesterday.  Bleeding with bowel movements.  Feels pressure, swollen.

## 2013-04-30 ENCOUNTER — Encounter: Payer: Medicaid Other | Admitting: Obstetrics

## 2013-05-07 ENCOUNTER — Ambulatory Visit (HOSPITAL_COMMUNITY)
Admission: RE | Admit: 2013-05-07 | Discharge: 2013-05-07 | Disposition: A | Discharge status: Home / Self Care | Payer: Medicaid Other | Source: Ambulatory Visit | Attending: Obstetrics | Admitting: Obstetrics

## 2013-05-07 DIAGNOSIS — Z3689 Encounter for other specified antenatal screening: Secondary | ICD-10-CM | POA: Insufficient documentation

## 2013-05-07 DIAGNOSIS — E119 Type 2 diabetes mellitus without complications: Secondary | ICD-10-CM

## 2013-05-07 DIAGNOSIS — O99810 Abnormal glucose complicating pregnancy: Secondary | ICD-10-CM | POA: Insufficient documentation

## 2013-05-07 NOTE — ED Notes (Signed)
Pt offered to see diabetic educator.  Declines visit today.  Rescheduled for next Thursday.

## 2013-05-07 NOTE — Progress Notes (Signed)
Melinda Jimenez  was seen today for an ultrasound appointment.  See full report in [facility].  Impression: Single IUP at 35 4/7 weeks A1 diabetes (diet controlled) Interval growth is appropriate (51st %tile) Normal amniotic fluid volume  Recommendations: Follow-up ultrasounds as clinically indicated.  Recommend delivery at 40 weeks if blood sugars remain well cotrolled and no other complications arise.  Alpha Gula, MD

## 2013-05-11 ENCOUNTER — Encounter: Payer: Self-pay | Admitting: Obstetrics

## 2013-05-11 ENCOUNTER — Ambulatory Visit (INDEPENDENT_AMBULATORY_CARE_PROVIDER_SITE_OTHER): Payer: Medicaid Other | Admitting: Obstetrics

## 2013-05-11 ENCOUNTER — Ambulatory Visit: Payer: Medicaid Other | Admitting: Obstetrics

## 2013-05-11 VITALS — BP 103/71 | Temp 98.5°F | Wt 238.2 lb

## 2013-05-11 DIAGNOSIS — Z3480 Encounter for supervision of other normal pregnancy, unspecified trimester: Secondary | ICD-10-CM

## 2013-05-11 DIAGNOSIS — Z3483 Encounter for supervision of other normal pregnancy, third trimester: Secondary | ICD-10-CM

## 2013-05-11 LAB — POCT URINALYSIS DIPSTICK
Blood, UA: NEGATIVE
Leukocytes, UA: NEGATIVE
Spec Grav, UA: 1.015
Urobilinogen, UA: NEGATIVE

## 2013-05-11 NOTE — Progress Notes (Signed)
Pulse: 99 

## 2013-05-14 ENCOUNTER — Ambulatory Visit (HOSPITAL_COMMUNITY): Payer: Medicaid Other | Attending: Obstetrics

## 2013-05-18 ENCOUNTER — Encounter: Payer: Medicaid Other | Admitting: Obstetrics

## 2013-05-20 ENCOUNTER — Inpatient Hospital Stay (HOSPITAL_COMMUNITY)
Admission: AD | Admit: 2013-05-20 | Discharge: 2013-05-23 | DRG: 766 | Disposition: A | Discharge status: Home / Self Care | Payer: Medicaid Other | Source: Ambulatory Visit | Attending: Obstetrics | Admitting: Obstetrics

## 2013-05-20 ENCOUNTER — Encounter (HOSPITAL_COMMUNITY): Payer: Self-pay | Admitting: Anesthesiology

## 2013-05-20 ENCOUNTER — Encounter (HOSPITAL_COMMUNITY)
Admission: AD | Disposition: A | Discharge status: Home / Self Care | Payer: Self-pay | Source: Ambulatory Visit | Attending: Obstetrics

## 2013-05-20 ENCOUNTER — Encounter (HOSPITAL_COMMUNITY): Payer: Self-pay | Admitting: *Deleted

## 2013-05-20 ENCOUNTER — Inpatient Hospital Stay (HOSPITAL_COMMUNITY): Payer: Medicaid Other | Admitting: Anesthesiology

## 2013-05-20 DIAGNOSIS — Z22330 Carrier of Group B streptococcus: Secondary | ICD-10-CM

## 2013-05-20 DIAGNOSIS — O99814 Abnormal glucose complicating childbirth: Secondary | ICD-10-CM | POA: Diagnosis present

## 2013-05-20 DIAGNOSIS — O99892 Other specified diseases and conditions complicating childbirth: Secondary | ICD-10-CM | POA: Diagnosis present

## 2013-05-20 DIAGNOSIS — A6000 Herpesviral infection of urogenital system, unspecified: Secondary | ICD-10-CM | POA: Diagnosis present

## 2013-05-20 DIAGNOSIS — Z98891 History of uterine scar from previous surgery: Secondary | ICD-10-CM

## 2013-05-20 DIAGNOSIS — O98519 Other viral diseases complicating pregnancy, unspecified trimester: Principal | ICD-10-CM | POA: Diagnosis present

## 2013-05-20 DIAGNOSIS — D649 Anemia, unspecified: Secondary | ICD-10-CM | POA: Diagnosis not present

## 2013-05-20 DIAGNOSIS — O9903 Anemia complicating the puerperium: Secondary | ICD-10-CM | POA: Diagnosis not present

## 2013-05-20 LAB — GLUCOSE, CAPILLARY
Glucose-Capillary: 109 mg/dL — ABNORMAL HIGH (ref 70–99)
Glucose-Capillary: 81 mg/dL (ref 70–99)

## 2013-05-20 LAB — CBC
Hemoglobin: 10 g/dL — ABNORMAL LOW (ref 12.0–15.0)
MCH: 22 pg — ABNORMAL LOW (ref 26.0–34.0)
MCHC: 32.2 g/dL (ref 30.0–36.0)
MCV: 68.5 fL — ABNORMAL LOW (ref 78.0–100.0)
Platelets: 273 10*3/uL (ref 150–400)

## 2013-05-20 LAB — TYPE AND SCREEN: ABO/RH(D): A POS

## 2013-05-20 SURGERY — Surgical Case
Anesthesia: Spinal | Site: Abdomen | Wound class: Clean Contaminated

## 2013-05-20 MED ORDER — OXYTOCIN 10 UNIT/ML IJ SOLN
INTRAMUSCULAR | Status: AC
  Filled 2013-05-20: qty 4

## 2013-05-20 MED ORDER — ONDANSETRON HCL 4 MG/2ML IJ SOLN: 4.0000 mg | Freq: Three times a day (TID) | INTRAMUSCULAR | Status: DC | PRN

## 2013-05-20 MED ORDER — VALACYCLOVIR HCL 500 MG PO TABS
1000.0000 mg | ORAL_TABLET | Freq: Every day | ORAL | Status: DC
  Filled 2013-05-20 (×2): qty 2

## 2013-05-20 MED ORDER — DIPHENHYDRAMINE HCL 50 MG/ML IJ SOLN: 12.5000 mg | INTRAMUSCULAR | Status: DC | PRN

## 2013-05-20 MED ORDER — PHENYLEPHRINE HCL 10 MG/ML IJ SOLN
INTRAMUSCULAR | Status: DC | PRN
  Administered 2013-05-20: 80 ug via INTRAVENOUS
  Administered 2013-05-20: 120 ug via INTRAVENOUS
  Administered 2013-05-20 (×2): 80 ug via INTRAVENOUS
  Administered 2013-05-20: 40 ug via INTRAVENOUS

## 2013-05-20 MED ORDER — HYDROMORPHONE HCL PF 1 MG/ML IJ SOLN: 0.2500 mg | INTRAMUSCULAR | Status: DC | PRN

## 2013-05-20 MED ORDER — NALBUPHINE HCL 10 MG/ML IJ SOLN
5.0000 mg | INTRAMUSCULAR | Status: DC | PRN
  Filled 2013-05-20: qty 1

## 2013-05-20 MED ORDER — KETOROLAC TROMETHAMINE 60 MG/2ML IM SOLN
INTRAMUSCULAR | Status: AC
  Administered 2013-05-20: 60 mg via INTRAMUSCULAR
  Filled 2013-05-20: qty 2

## 2013-05-20 MED ORDER — SODIUM CHLORIDE 0.9 % IJ SOLN: 3.0000 mL | INTRAMUSCULAR | Status: DC | PRN

## 2013-05-20 MED ORDER — BUTORPHANOL TARTRATE 1 MG/ML IJ SOLN
1.0000 mg | Freq: Once | INTRAMUSCULAR | Status: AC
  Administered 2013-05-20: 1 mg via INTRAVENOUS
  Filled 2013-05-20: qty 1

## 2013-05-20 MED ORDER — LACTATED RINGERS IV BOLUS (SEPSIS)
500.0000 mL | Freq: Once | INTRAVENOUS | Status: AC
  Administered 2013-05-20: 1000 mL via INTRAVENOUS

## 2013-05-20 MED ORDER — PROMETHAZINE HCL 25 MG/ML IJ SOLN
INTRAMUSCULAR | Status: AC
  Filled 2013-05-20: qty 1

## 2013-05-20 MED ORDER — PROMETHAZINE HCL 25 MG/ML IJ SOLN: 6.2500 mg | INTRAMUSCULAR | Status: DC | PRN

## 2013-05-20 MED ORDER — CEFAZOLIN SODIUM-DEXTROSE 2-3 GM-% IV SOLR
2.0000 g | INTRAVENOUS | Status: AC
  Administered 2013-05-20: 2 g via INTRAVENOUS
  Filled 2013-05-20: qty 50

## 2013-05-20 MED ORDER — FENTANYL CITRATE 0.05 MG/ML IJ SOLN
INTRAMUSCULAR | Status: DC | PRN
  Administered 2013-05-20: 12.5 ug via INTRATHECAL

## 2013-05-20 MED ORDER — OXYCODONE-ACETAMINOPHEN 5-325 MG PO TABS
1.0000 | ORAL_TABLET | ORAL | Status: DC | PRN
  Administered 2013-05-21 – 2013-05-23 (×12): 1 via ORAL
  Filled 2013-05-20 (×3): qty 1
  Filled 2013-05-20: qty 2
  Filled 2013-05-20 (×10): qty 1

## 2013-05-20 MED ORDER — IBUPROFEN 600 MG PO TABS
600.0000 mg | ORAL_TABLET | Freq: Four times a day (QID) | ORAL | Status: DC
  Administered 2013-05-20 – 2013-05-23 (×10): 600 mg via ORAL
  Filled 2013-05-20 (×11): qty 1

## 2013-05-20 MED ORDER — TERBUTALINE SULFATE 1 MG/ML IJ SOLN
INTRAMUSCULAR | Status: AC
  Filled 2013-05-20: qty 1

## 2013-05-20 MED ORDER — ZOLPIDEM TARTRATE 5 MG PO TABS: 5.0000 mg | ORAL_TABLET | Freq: Every evening | ORAL | Status: DC | PRN

## 2013-05-20 MED ORDER — DIPHENHYDRAMINE HCL 25 MG PO CAPS: 25.0000 mg | ORAL_CAPSULE | Freq: Four times a day (QID) | ORAL | Status: DC | PRN

## 2013-05-20 MED ORDER — LACTATED RINGERS IV SOLN
INTRAVENOUS | Status: DC
  Administered 2013-05-20: 20:00:00 via INTRAVENOUS

## 2013-05-20 MED ORDER — ONDANSETRON HCL 4 MG PO TABS: 4.0000 mg | ORAL_TABLET | ORAL | Status: DC | PRN

## 2013-05-20 MED ORDER — MEPERIDINE HCL 25 MG/ML IJ SOLN: 6.2500 mg | INTRAMUSCULAR | Status: DC | PRN

## 2013-05-20 MED ORDER — NALOXONE HCL 0.4 MG/ML IJ SOLN: 0.4000 mg | INTRAMUSCULAR | Status: DC | PRN

## 2013-05-20 MED ORDER — PHENYLEPHRINE 40 MCG/ML (10ML) SYRINGE FOR IV PUSH (FOR BLOOD PRESSURE SUPPORT)
PREFILLED_SYRINGE | INTRAVENOUS | Status: AC
  Filled 2013-05-20: qty 10

## 2013-05-20 MED ORDER — OXYTOCIN 40 UNITS IN LACTATED RINGERS INFUSION - SIMPLE MED: 62.5000 mL/h | INTRAVENOUS | Status: AC

## 2013-05-20 MED ORDER — NALOXONE HCL 1 MG/ML IJ SOLN
1.0000 ug/kg/h | INTRAVENOUS | Status: DC | PRN
  Filled 2013-05-20: qty 2

## 2013-05-20 MED ORDER — SCOPOLAMINE 1 MG/3DAYS TD PT72
MEDICATED_PATCH | TRANSDERMAL | Status: AC
  Administered 2013-05-20: 1.5 mg via TRANSDERMAL
  Filled 2013-05-20: qty 1

## 2013-05-20 MED ORDER — WITCH HAZEL-GLYCERIN EX PADS: 1.0000 "application " | MEDICATED_PAD | CUTANEOUS | Status: DC | PRN

## 2013-05-20 MED ORDER — SCOPOLAMINE 1 MG/3DAYS TD PT72: 1.0000 | MEDICATED_PATCH | Freq: Once | TRANSDERMAL | Status: AC

## 2013-05-20 MED ORDER — MEDROXYPROGESTERONE ACETATE 150 MG/ML IM SUSP: 150.0000 mg | INTRAMUSCULAR | Status: DC | PRN

## 2013-05-20 MED ORDER — TERBUTALINE SULFATE 1 MG/ML IJ SOLN
0.2500 mg | Freq: Once | INTRAMUSCULAR | Status: AC
  Administered 2013-05-20: 0.25 mg via SUBCUTANEOUS

## 2013-05-20 MED ORDER — KETOROLAC TROMETHAMINE 30 MG/ML IJ SOLN: 30.0000 mg | Freq: Four times a day (QID) | INTRAMUSCULAR | Status: AC | PRN

## 2013-05-20 MED ORDER — OXYTOCIN 10 UNIT/ML IJ SOLN
40.0000 [IU] | INTRAVENOUS | Status: DC | PRN
  Administered 2013-05-20: 40 [IU] via INTRAVENOUS

## 2013-05-20 MED ORDER — SIMETHICONE 80 MG PO CHEW: 80.0000 mg | CHEWABLE_TABLET | ORAL | Status: DC | PRN

## 2013-05-20 MED ORDER — KETOROLAC TROMETHAMINE 30 MG/ML IJ SOLN: 15.0000 mg | Freq: Once | INTRAMUSCULAR | Status: DC | PRN

## 2013-05-20 MED ORDER — MORPHINE SULFATE (PF) 0.5 MG/ML IJ SOLN
INTRAMUSCULAR | Status: DC | PRN
  Administered 2013-05-20: .2 mg via INTRATHECAL

## 2013-05-20 MED ORDER — BUPIVACAINE IN DEXTROSE 0.75-8.25 % IT SOLN
INTRATHECAL | Status: DC | PRN
  Administered 2013-05-20: 1.4 mL via INTRATHECAL

## 2013-05-20 MED ORDER — TETANUS-DIPHTH-ACELL PERTUSSIS 5-2.5-18.5 LF-MCG/0.5 IM SUSP: 0.5000 mL | Freq: Once | INTRAMUSCULAR | Status: DC

## 2013-05-20 MED ORDER — LACTATED RINGERS IV SOLN
INTRAVENOUS | Status: DC
  Administered 2013-05-20: 10:00:00 via INTRAVENOUS

## 2013-05-20 MED ORDER — METOCLOPRAMIDE HCL 5 MG/ML IJ SOLN: 10.0000 mg | Freq: Three times a day (TID) | INTRAMUSCULAR | Status: DC | PRN

## 2013-05-20 MED ORDER — SENNOSIDES-DOCUSATE SODIUM 8.6-50 MG PO TABS
2.0000 | ORAL_TABLET | Freq: Every day | ORAL | Status: DC
  Administered 2013-05-20 – 2013-05-22 (×3): 2 via ORAL

## 2013-05-20 MED ORDER — KETOROLAC TROMETHAMINE 60 MG/2ML IM SOLN: 60.0000 mg | Freq: Once | INTRAMUSCULAR | Status: AC | PRN

## 2013-05-20 MED ORDER — PRENATAL MULTIVITAMIN CH
1.0000 | ORAL_TABLET | Freq: Every day | ORAL | Status: DC
  Administered 2013-05-21 – 2013-05-23 (×3): 1 via ORAL
  Filled 2013-05-20 (×3): qty 1

## 2013-05-20 MED ORDER — KETOROLAC TROMETHAMINE 30 MG/ML IJ SOLN
30.0000 mg | Freq: Four times a day (QID) | INTRAMUSCULAR | Status: AC | PRN
  Administered 2013-05-20: 30 mg via INTRAVENOUS
  Filled 2013-05-20: qty 1

## 2013-05-20 MED ORDER — CITRIC ACID-SODIUM CITRATE 334-500 MG/5ML PO SOLN
30.0000 mL | Freq: Once | ORAL | Status: AC
  Administered 2013-05-20: 30 mL via ORAL
  Filled 2013-05-20: qty 15

## 2013-05-20 MED ORDER — DIPHENHYDRAMINE HCL 50 MG/ML IJ SOLN: 25.0000 mg | INTRAMUSCULAR | Status: DC | PRN

## 2013-05-20 MED ORDER — SIMETHICONE 80 MG PO CHEW
80.0000 mg | CHEWABLE_TABLET | Freq: Three times a day (TID) | ORAL | Status: DC
  Administered 2013-05-20 – 2013-05-23 (×8): 80 mg via ORAL

## 2013-05-20 MED ORDER — LANOLIN HYDROUS EX OINT: 1.0000 "application " | TOPICAL_OINTMENT | CUTANEOUS | Status: DC | PRN

## 2013-05-20 MED ORDER — DIBUCAINE 1 % RE OINT: 1.0000 "application " | TOPICAL_OINTMENT | RECTAL | Status: DC | PRN

## 2013-05-20 MED ORDER — MENTHOL 3 MG MT LOZG: 1.0000 | LOZENGE | OROMUCOSAL | Status: DC | PRN

## 2013-05-20 MED ORDER — FAMOTIDINE IN NACL 20-0.9 MG/50ML-% IV SOLN
20.0000 mg | Freq: Once | INTRAVENOUS | Status: AC
  Administered 2013-05-20: 20 mg via INTRAVENOUS
  Filled 2013-05-20: qty 50

## 2013-05-20 MED ORDER — ONDANSETRON HCL 4 MG/2ML IJ SOLN: 4.0000 mg | INTRAMUSCULAR | Status: DC | PRN

## 2013-05-20 MED ORDER — DIPHENHYDRAMINE HCL 25 MG PO CAPS
25.0000 mg | ORAL_CAPSULE | ORAL | Status: DC | PRN
  Filled 2013-05-20: qty 1

## 2013-05-20 MED ORDER — LACTATED RINGERS IV SOLN
INTRAVENOUS | Status: DC | PRN
  Administered 2013-05-20: 11:00:00 via INTRAVENOUS

## 2013-05-20 SURGICAL SUPPLY — 43 items
ADH SKN CLS APL DERMABOND .7 (GAUZE/BANDAGES/DRESSINGS) ×1
CANISTER WOUND CARE 500ML ATS (WOUND CARE) IMPLANT
CLAMP CORD UMBIL (MISCELLANEOUS) IMPLANT
CLOTH BEACON ORANGE TIMEOUT ST (SAFETY) ×2 IMPLANT
CONTAINER PREFILL 10% NBF 15ML (MISCELLANEOUS) ×4 IMPLANT
DERMABOND ADVANCED (GAUZE/BANDAGES/DRESSINGS) ×1
DERMABOND ADVANCED .7 DNX12 (GAUZE/BANDAGES/DRESSINGS) ×1 IMPLANT
DRAPE LG THREE QUARTER DISP (DRAPES) ×2 IMPLANT
DRSG OPSITE POSTOP 4X10 (GAUZE/BANDAGES/DRESSINGS) ×2 IMPLANT
DRSG VAC ATS LRG SENSATRAC (GAUZE/BANDAGES/DRESSINGS) IMPLANT
DRSG VAC ATS MED SENSATRAC (GAUZE/BANDAGES/DRESSINGS) IMPLANT
DRSG VAC ATS SM SENSATRAC (GAUZE/BANDAGES/DRESSINGS) IMPLANT
DURAPREP 26ML APPLICATOR (WOUND CARE) ×2 IMPLANT
ELECT REM PT RETURN 9FT ADLT (ELECTROSURGICAL) ×2
ELECTRODE REM PT RTRN 9FT ADLT (ELECTROSURGICAL) ×1 IMPLANT
EXTRACTOR VACUUM M CUP 4 TUBE (SUCTIONS) IMPLANT
GLOVE BIO SURGEON STRL SZ8 (GLOVE) ×4 IMPLANT
GOWN PREVENTION PLUS XLARGE (GOWN DISPOSABLE) ×2 IMPLANT
GOWN STRL REIN XL XLG (GOWN DISPOSABLE) ×4 IMPLANT
KIT ABG SYR 3ML LUER SLIP (SYRINGE) IMPLANT
NDL HYPO 25X5/8 SAFETYGLIDE (NEEDLE) ×1 IMPLANT
NEEDLE HYPO 25X5/8 SAFETYGLIDE (NEEDLE) ×2 IMPLANT
NS IRRIG 1000ML POUR BTL (IV SOLUTION) ×2 IMPLANT
PACK C SECTION WH (CUSTOM PROCEDURE TRAY) ×2 IMPLANT
PAD OB MATERNITY 4.3X12.25 (PERSONAL CARE ITEMS) ×2 IMPLANT
RTRCTR C-SECT PINK 25CM LRG (MISCELLANEOUS) ×2 IMPLANT
STAPLER VISISTAT 35W (STAPLE) IMPLANT
SUT GUT PLAIN 0 CT-3 TAN 27 (SUTURE) IMPLANT
SUT MNCRL 0 VIOLET CTX 36 (SUTURE) ×3 IMPLANT
SUT MNCRL AB 4-0 PS2 18 (SUTURE) IMPLANT
SUT MON AB 2-0 CT1 27 (SUTURE) ×2 IMPLANT
SUT MON AB 3-0 SH 27 (SUTURE)
SUT MON AB 3-0 SH27 (SUTURE) IMPLANT
SUT MONOCRYL 0 CTX 36 (SUTURE) ×3
SUT PDS AB 0 CTX 60 (SUTURE) IMPLANT
SUT PLAIN 2 0 XLH (SUTURE) IMPLANT
SUT VIC AB 0 CTX 36 (SUTURE)
SUT VIC AB 0 CTX36XBRD ANBCTRL (SUTURE) IMPLANT
SUT VIC AB 2-0 CT1 27 (SUTURE)
SUT VIC AB 2-0 CT1 TAPERPNT 27 (SUTURE) IMPLANT
TOWEL OR 17X24 6PK STRL BLUE (TOWEL DISPOSABLE) ×2 IMPLANT
TRAY FOLEY CATH 14FR (SET/KITS/TRAYS/PACK) ×2 IMPLANT
WATER STERILE IRR 1000ML POUR (IV SOLUTION) ×2 IMPLANT

## 2013-05-20 NOTE — Transfer of Care (Signed)
Immediate Anesthesia Transfer of Care Note  Patient: Melinda Jimenez  Procedure(s) Performed: Procedure(s): CESAREAN SECTION (N/A)  Patient Location: PACU  Anesthesia Type:Spinal  Level of Consciousness: awake, alert  and oriented  Airway & Oxygen Therapy: Patient Spontanous Breathing  Post-op Assessment: Report given to PACU RN and Post -op Vital signs reviewed and stable  Post vital signs: Reviewed and stable  Complications: No apparent anesthesia complications

## 2013-05-20 NOTE — H&P (Signed)
Melinda Jimenez is a 27 y.o. female presenting for rupture of membranes. Maternal Medical History:  Fetal activity: Perceived fetal activity is normal.   Last perceived fetal movement was within the past hour.    Prenatal complications: Infection.   Prenatal Complications - Diabetes: gestational. Diabetes is managed by diet.      OB History   Grav Para Term Preterm Abortions TAB SAB Ect Mult Living   7 2 2  4 2 2   1      Past Medical History  Diagnosis Date  . Genital HSV     Last OB: > 5yr ago; Valtrex 500 mg po prn outbreaks  . Thyroid disease   . Mood swings   . Headache(784.0)   . Chlamydia   . Trichomonas   . Anemia   . Gestational diabetes     diet controlled  . Infection     UTI  . Anxiety     off meds with preg  . Panic attack    Past Surgical History  Procedure Laterality Date  . Induced abortion  2009  . Vaginal delivery  2007   Family History: family history includes Anxiety disorder in her mother; Autism in an other family member; Bipolar disorder in her brother; Breast cancer in her other; Cancer in her maternal grandmother; Depression in her mother; Diabetes in her maternal grandmother; Hypertension in her maternal grandmother and paternal grandmother; Schizophrenia in an other family member. Social History:  reports that she has quit smoking. She has never used smokeless tobacco. She reports that  drinks alcohol. She reports that she does not use illicit drugs.   Prenatal Transfer Tool  Maternal Diabetes: Yes:  Diabetes Type:  Diet controlled -  Gestational Genetic Screening: Normal Maternal Ultrasounds/Referrals: Normal Fetal Ultrasounds or other Referrals:  Referred to Materal Fetal Medicine  Maternal Substance Abuse:  No Significant Maternal Medications:  None Significant Maternal Lab Results:  Lab values include: Group B Strep positive Other Comments:  None  Review of Systems  All other systems reviewed and are negative.    Dilation:  2.5 Effacement (%): 80 Station: -1 Exam by:: DCALLAWAY, RN Blood pressure 104/63, pulse 101, temperature 97.7 F (36.5 C), temperature source Oral, resp. rate 20, height 5\' 7"  (1.702 m), weight 236 lb 4 oz (107.162 kg), last menstrual period 08/31/2012, SpO2 100.00%. Maternal Exam:  Uterine Assessment: Contraction strength is mild.  Abdomen: Patient reports no abdominal tenderness. Fetal presentation: vertex  Introitus: Vulva is positive for lesion. Cervix: Cervix evaluated by digital exam.     Physical Exam  Constitutional: She is oriented to person, place, and time. She appears well-developed and well-nourished.  HENT:  Head: Normocephalic and atraumatic.  Eyes: Conjunctivae are normal. Pupils are equal, round, and reactive to light.  Neck: Normal range of motion. Neck supple.  Cardiovascular: Normal rate and regular rhythm.   Respiratory: Effort normal and breath sounds normal.  GI: Soft.  Genitourinary: Vagina normal and uterus normal. Vulva exhibits lesion.  Musculoskeletal: Normal range of motion.  Neurological: She is alert and oriented to person, place, and time.  Skin: Skin is warm and dry.  Psychiatric: She has a normal mood and affect. Her behavior is normal. Judgment and thought content normal.    Prenatal labs: ABO, Rh: --/--/A POS (08/20 0415) Antibody: NEG (08/20 0415) Rubella: Immune (03/04 0000) RPR: NON REAC (06/05 1105)  HBsAg: Negative (03/03 0000)  HIV: NON REACTIVE (06/05 1105)  GBS: POSITIVE (07/23 1711)   Assessment/Plan: 37  weeks.  SROM.  Active Herpes lesion.   HARPER,CHARLES A 05/20/2013, 8:33 AM

## 2013-05-20 NOTE — Anesthesia Procedure Notes (Signed)
Spinal  Patient location during procedure: OR Start time: 05/20/2013 10:30 AM End time: 05/20/2013 10:45 AM Staffing Anesthesiologist: Sandrea Hughs Performed by: anesthesiologist  Preanesthetic Checklist Completed: patient identified, site marked, surgical consent, pre-op evaluation, timeout performed, IV checked, risks and benefits discussed and monitors and equipment checked Spinal Block Patient position: sitting Prep: DuraPrep Patient monitoring: heart rate, cardiac monitor, continuous pulse ox and blood pressure Approach: midline Location: L3-4 Injection technique: single-shot Needle Needle type: Tuohy and Sprotte  Needle gauge: 24 G Needle length: 9 cm Needle insertion depth: 11 cm Catheter size: 19 g Catheter at skin depth: 13 cm Assessment Sensory level: T4 Additional Notes Needed 18G Tuohy X 1 to 8cm followed by 24 G Sprotte to + CSF after 6 attempts at traditional spinal.

## 2013-05-20 NOTE — Anesthesia Postprocedure Evaluation (Signed)
  Anesthesia Post Note  Patient: Melinda Jimenez  Procedure(s) Performed: Procedure(s) (LRB): CESAREAN SECTION (N/A)  Anesthesia type: Spinal  Patient location: PACU  Post pain: Pain level controlled  Post assessment: Post-op Vital signs reviewed  Last Vitals:  Filed Vitals:   05/20/13 0940  BP: 119/68  Pulse: 106  Temp: 36.7 C  Resp: 18    Post vital signs: Reviewed  Level of consciousness: awake  Complications: No apparent anesthesia complications

## 2013-05-20 NOTE — Anesthesia Postprocedure Evaluation (Signed)
Anesthesia Post Note  Patient: Melinda Jimenez  Procedure(s) Performed: Procedure(s) (LRB): CESAREAN SECTION (N/A)  Anesthesia type: Spinal  Patient location: Mother/Baby  Post pain: Pain level controlled  Post assessment: Post-op Vital signs reviewed  Last Vitals:  Filed Vitals:   05/20/13 1535  BP: 107/73  Pulse: 95  Temp: 36.1 C  Resp: 19    Post vital signs: Reviewed  Level of consciousness: awake  Complications: No apparent anesthesia complications

## 2013-05-20 NOTE — Op Note (Signed)
Cesarean Section Procedure Note   MARCELLINE TEMKIN   05/20/2013  Indications: 37 weeks.  SROM.  Active genital herpes.   Pre-operative Diagnosis: HSV OUT BREAK.  37 weeks.  SROM.  Post-operative Diagnosis: Same   Surgeon: Coral Ceo A  Assistants: Francoise Ceo  Anesthesia: epidural  Procedure Details:  The patient was seen in the Holding Room. The risks, benefits, complications, treatment options, and expected outcomes were discussed with the patient. The patient concurred with the proposed plan, giving informed consent. The patient was identified as Melinda Jimenez and the procedure verified as C-Section Delivery. A Time Out was held and the above information confirmed.  After induction of anesthesia, the patient was draped and prepped in the usual sterile manner. A transverse incision was made and carried down through the subcutaneous tissue to the fascia. The fascial incision was made and extended transversely. The fascia was separated from the underlying rectus tissue superiorly and inferiorly. The peritoneum was identified and entered. The peritoneal incision was extended longitudinally. The utero-vesical peritoneal reflection was incised transversely and the bladder flap was bluntly freed from the lower uterine segment. A low transverse uterine incision was made. Delivered from cephalic presentation was a 2835 gram living newborn female infant(s). APGAR (1 MIN): 9   APGAR (5 MINS): 9   APGAR (10 MINS):    A cord ph was not sent. The umbilical cord was clamped and cut cord. A sample was obtained for evaluation. The placenta was removed Intact and appeared normal.  The uterine incision was closed with running locked sutures of 1-0 Monocryl. A second imbricating layer of the same suture was placed.  Hemostasis was observed. The paracolic gutters were irrigated. The parieto peritoneum was closed in a running fashion with 2-0 Vicryl.  The fascia was then reapproximated with running sutures of  0 Vicryl.  The skin was closed with staples.  Instrument, sponge, and needle counts were correct prior the abdominal closure and were correct at the conclusion of the case.    Findings:  Normal uterus, ovaries and tubes.   Estimated Blood Loss:  Total IV Fluids:   Urine Output: 150CC OF clear urine  Specimens: @ORSPECIMEN @   Complications: no complications  Disposition: PACU - hemodynamically stable.  Maternal Condition: stable   Baby condition / location:  nursery-stable    Signed: Surgeon(s): Brock Bad, MD

## 2013-05-20 NOTE — Anesthesia Preprocedure Evaluation (Addendum)
Anesthesia Evaluation   Patient awake    Reviewed: Allergy & Precautions, H&P , NPO status , Patient's Chart, lab work & pertinent test results  Airway Mallampati: II TM Distance: >3 FB Neck ROM: full    Dental no notable dental hx.    Pulmonary neg pulmonary ROS,    Pulmonary exam normal       Cardiovascular negative cardio ROS  Rhythm:regular Rate:Normal     Neuro/Psych    GI/Hepatic Neg liver ROS,   Endo/Other    Renal/GU negative Renal ROS     Musculoskeletal   Abdominal Normal abdominal exam  (+)   Peds  Hematology negative hematology ROS (+)   Anesthesia Other Findings   Reproductive/Obstetrics (+) Pregnancy                           Anesthesia Physical Anesthesia Plan  ASA: II  Anesthesia Plan: Spinal   Post-op Pain Management:    Induction:   Airway Management Planned:   Additional Equipment:   Intra-op Plan:   Post-operative Plan:   Informed Consent:   Plan Discussed with: CRNA and Surgeon  Anesthesia Plan Comments:         Anesthesia Quick Evaluation

## 2013-05-20 NOTE — MAU Note (Signed)
PT SAYS SHE AWOKE AT 0110- SHE SAT ON BED AND FELT  LIQUID RUN OUT  THEN WENT TO B-ROOM.  SAYS FLUID CONTINUES TO COME OUT. NO VE IN  OFFICE .  HAS HX HSV- TAKING VALTREX- STARTED ON MONDAY SAYS HAS AN OUTBREAK NOW-  STARTED  ON Monday..  DENIES MRSA.

## 2013-05-21 ENCOUNTER — Encounter (HOSPITAL_COMMUNITY): Payer: Self-pay | Admitting: Obstetrics

## 2013-05-21 ENCOUNTER — Encounter: Payer: Medicaid Other | Admitting: Obstetrics

## 2013-05-21 DIAGNOSIS — A6000 Herpesviral infection of urogenital system, unspecified: Secondary | ICD-10-CM

## 2013-05-21 LAB — CBC
HCT: 24.5 % — ABNORMAL LOW (ref 36.0–46.0)
MCHC: 32.2 g/dL (ref 30.0–36.0)
Platelets: 223 10*3/uL (ref 150–400)
RDW: 18.2 % — ABNORMAL HIGH (ref 11.5–15.5)
WBC: 10.6 10*3/uL — ABNORMAL HIGH (ref 4.0–10.5)

## 2013-05-21 LAB — RPR: RPR Ser Ql: NONREACTIVE

## 2013-05-21 NOTE — Progress Notes (Signed)
Subjective: Postpartum Day 1: Cesarean Delivery Patient reports tolerating PO, + flatus and no problems voiding.    Objective: Vital signs in last 24 hours: Temp:  [97 F (36.1 C)-98.5 F (36.9 C)] 98.5 F (36.9 C) (08/21 0545) Pulse Rate:  [86-122] 95 (08/21 0545) Resp:  [18-24] 20 (08/21 0545) BP: (96-119)/(65-80) 108/76 mmHg (08/21 0545) SpO2:  [98 %-100 %] 98 % (08/21 0545)  Physical Exam:  General: alert and no distress Lochia: appropriate Uterine Fundus: firm Incision: healing well DVT Evaluation: No evidence of DVT seen on physical exam.   Recent Labs  05/20/13 0345 05/21/13 0614  HGB 10.0* 7.9*  HCT 31.1* 24.5*    Assessment/Plan: Status post Cesarean section. Doing well postoperatively.  Anemia.  Clinically stable.  Iron Rx. Continue current care.  Jauan Wohl A 05/21/2013, 9:08 AM
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
## 2013-05-21 NOTE — Progress Notes (Signed)
UR chart review completed.  

## 2013-05-22 NOTE — Progress Notes (Signed)
Subjective: Postpartum Day 1: Cesarean Delivery Patient reports incisional pain, tolerating PO and + flatus.    Objective: Vital signs in last 24 hours: Temp:  [97.3 F (36.3 C)-98 F (36.7 C)] 97.3 F (36.3 C) (08/22 0501) Pulse Rate:  [90-100] 90 (08/22 0501) Resp:  [18] 18 (08/22 0501) BP: (106-110)/(71-72) 110/71 mmHg (08/22 0501)  Physical Exam:  General: alert and cooperative Lochia: appropriate Uterine Fundus: firm Incision: healing well DVT Evaluation: No evidence of DVT seen on physical exam.   Recent Labs  05/20/13 0345 05/21/13 0614  HGB 10.0* 7.9*  HCT 31.1* 24.5*    Assessment/Plan: Status post Cesarean section. Doing well postoperatively.  Continue current care.  Azadeh Hyder 05/22/2013, 8:56 AM

## 2013-05-23 MED ORDER — IBUPROFEN 600 MG PO TABS: 600.0000 mg | ORAL_TABLET | Freq: Four times a day (QID) | ORAL | Status: DC | PRN

## 2013-05-23 MED ORDER — FUSION PLUS PO CAPS: 1.0000 | ORAL_CAPSULE | Freq: Every day | ORAL | Status: DC

## 2013-05-23 MED ORDER — OXYCODONE-ACETAMINOPHEN 5-325 MG PO TABS: 1.0000 | ORAL_TABLET | ORAL | Status: DC | PRN

## 2013-05-23 NOTE — Progress Notes (Signed)
Subjective: Postpartum Day 3: Cesarean Delivery Patient reports no complaints.  Objective: Vital signs in last 24 hours: Temp:  [97.5 F (36.4 C)-97.8 F (36.6 C)] 97.5 F (36.4 C) (08/23 0618) Pulse Rate:  [88-98] 88 (08/23 0618) Resp:  [18] 18 (08/23 0618) BP: (110-113)/(65-76) 110/76 mmHg (08/23 0618)  Physical Exam:  General: alert and no distress Lochia: appropriate Uterine Fundus: firm Incision: healing well DVT Evaluation: No evidence of DVT seen on physical exam.   Recent Labs  05/21/13 0614  HGB 7.9*  HCT 24.5*    Assessment/Plan: Status post Cesarean section. Doing well postoperatively.  Anemia.  Clinically stable.  Iron Rx. Discharge home with standard precautions and return to clinic in 2 weeks.  Camil Hausmann A 05/23/2013, 6:18 AM

## 2013-05-23 NOTE — Discharge Summary (Signed)
Obstetric Discharge Summary Reason for Admission: cesarean section Prenatal Procedures: ultrasound Intrapartum Procedures: cesarean: low cervical, transverse Postpartum Procedures: none Complications-Operative and Postpartum: none Hemoglobin  Date Value Range Status  05/21/2013 7.9* 12.0 - 15.0 g/dL Final     DELTA CHECK NOTED     REPEATED TO VERIFY     HCT  Date Value Range Status  05/21/2013 24.5* 36.0 - 46.0 % Final    Physical Exam:  General: alert and no distress Lochia: appropriate Uterine Fundus: firm Incision: healing well DVT Evaluation: No evidence of DVT seen on physical exam.  Discharge Diagnoses: Term Pregnancy-delivered  Discharge Information: Date: 05/23/2013 Activity: pelvic rest Diet: routine Medications: PNV, Ibuprofen, Colace, Iron and Percocet Condition: stable Instructions: refer to practice specific booklet Discharge to: home Follow-up Information   Follow up with HARPER,CHARLES A, MD. Schedule an appointment as soon as possible for a visit in 4 days. (Removal of bandage and staples in office)    Specialty:  Obstetrics and Gynecology   Contact information:   [address] [place] [address] [phone]       Newborn Data: Live born female  Birth Weight: 6 lb 4 oz (2835 g) APGAR: 9, 9  Home with mother.  HARPER,CHARLES A 05/23/2013, 6:31 AM

## 2013-05-25 ENCOUNTER — Encounter: Payer: Medicaid Other | Admitting: Obstetrics

## 2013-05-26 ENCOUNTER — Ambulatory Visit (INDEPENDENT_AMBULATORY_CARE_PROVIDER_SITE_OTHER): Payer: Medicaid Other | Admitting: Obstetrics

## 2013-05-26 ENCOUNTER — Encounter: Payer: Self-pay | Admitting: Obstetrics

## 2013-05-26 DIAGNOSIS — Z3009 Encounter for other general counseling and advice on contraception: Secondary | ICD-10-CM

## 2013-05-26 DIAGNOSIS — N390 Urinary tract infection, site not specified: Secondary | ICD-10-CM

## 2013-05-26 MED ORDER — MEDROXYPROGESTERONE ACETATE 150 MG/ML IM SUSP: 150.0000 mg | INTRAMUSCULAR | Status: DC

## 2013-05-26 MED ORDER — CEPHALEXIN 500 MG PO CAPS: 500.0000 mg | ORAL_CAPSULE | Freq: Three times a day (TID) | ORAL | Status: DC

## 2013-05-26 NOTE — Progress Notes (Signed)
Subjective:     Melinda Jimenez is a 27 y.o. female who presents for a postpartum visit. She is 1 week postpartum following a low cervical transverse Cesarean section. I have fully reviewed the prenatal and intrapartum course. The delivery was at 37.3 gestational weeks. Outcome: primary cesarean section, low transverse incision. Anesthesia: spinal. Postpartum course has been normal- but patient has questions about bilateral wrist and hand numbness, swelling in feet and blurred vision for 2 days.. Baby's course has been normal. Baby is feeding by bottle - gerber. Bleeding red and clots. Bowel function is abnormal: increased gas causing cramping and constipation.. Bladder function is abnormal: "pulling sensation- when urinates". Patient is not sexually active. Contraception method is none- patient wants to try the Depo. Postpartum depression screening: negative.  The following portions of the patient's history were reviewed and updated as appropriate: allergies, current medications, past family history, past medical history, past social history, past surgical history and problem list.  Review of Systems Pertinent items are noted in HPI.   Objective:    BP 120/88  Pulse 89  Temp(Src) 97.2 F (36.2 C)  Wt 230 lb (104.327 kg)  BMI 36.01 kg/m2  Breastfeeding? No  General:  alert and no distress Abdomen:  Incision C, D, I.  Staples removed and steri strips applied in routine fashion.   Assessment:     Normal postpartum exam. Pap smear not done at today's visit.   Plan:    1. Contraception: Depo Provera 2. Contraceptive options discussed. 3. Follow up in: 6 weeks or as needed.

## 2013-05-27 ENCOUNTER — Ambulatory Visit: Payer: Medicaid Other | Admitting: Obstetrics

## 2013-05-27 LAB — URINE CULTURE: Colony Count: NO GROWTH

## 2013-06-11 ENCOUNTER — Other Ambulatory Visit: Payer: Self-pay | Admitting: *Deleted

## 2013-06-11 DIAGNOSIS — Z7251 High risk heterosexual behavior: Secondary | ICD-10-CM

## 2013-06-11 MED ORDER — LEVONORGESTREL 0.75 MG PO TABS: 0.7500 mg | ORAL_TABLET | Freq: Two times a day (BID) | ORAL | Status: DC

## 2013-06-12 ENCOUNTER — Encounter: Payer: Self-pay | Admitting: Obstetrics

## 2013-06-16 ENCOUNTER — Encounter: Payer: Self-pay | Admitting: Obstetrics & Gynecology

## 2013-06-18 ENCOUNTER — Ambulatory Visit: Payer: Medicaid Other | Admitting: Obstetrics

## 2013-06-19 ENCOUNTER — Encounter: Payer: Self-pay | Admitting: Advanced Practice Midwife

## 2013-06-19 ENCOUNTER — Ambulatory Visit (INDEPENDENT_AMBULATORY_CARE_PROVIDER_SITE_OTHER): Payer: Medicaid Other | Admitting: Advanced Practice Midwife

## 2013-06-19 VITALS — BP 120/79 | HR 97 | Temp 98.3°F

## 2013-06-19 DIAGNOSIS — IMO0001 Reserved for inherently not codable concepts without codable children: Secondary | ICD-10-CM

## 2013-06-19 DIAGNOSIS — Z309 Encounter for contraceptive management, unspecified: Secondary | ICD-10-CM

## 2013-06-19 MED ORDER — MEDROXYPROGESTERONE ACETATE 150 MG/ML IM SUSP
150.0000 mg | Freq: Once | INTRAMUSCULAR | Status: AC
  Administered 2013-06-19: 150 mg via INTRAMUSCULAR
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
## 2013-06-26 ENCOUNTER — Emergency Department (HOSPITAL_COMMUNITY)
Admission: EM | Admit: 2013-06-26 | Discharge: 2013-06-26 | Disposition: A | Discharge status: Home / Self Care | Payer: Medicaid Other | Attending: Emergency Medicine | Admitting: Emergency Medicine

## 2013-06-26 ENCOUNTER — Emergency Department (HOSPITAL_COMMUNITY)
Admission: EM | Admit: 2013-06-26 | Discharge: 2013-06-26 | Disposition: A | Discharge status: Home / Self Care | Payer: Self-pay | Source: Home / Self Care

## 2013-06-26 ENCOUNTER — Encounter (HOSPITAL_COMMUNITY): Payer: Self-pay | Admitting: Emergency Medicine

## 2013-06-26 DIAGNOSIS — L738 Other specified follicular disorders: Secondary | ICD-10-CM | POA: Insufficient documentation

## 2013-06-26 DIAGNOSIS — Z87891 Personal history of nicotine dependence: Secondary | ICD-10-CM | POA: Insufficient documentation

## 2013-06-26 DIAGNOSIS — Z8632 Personal history of gestational diabetes: Secondary | ICD-10-CM | POA: Insufficient documentation

## 2013-06-26 DIAGNOSIS — Z8639 Personal history of other endocrine, nutritional and metabolic disease: Secondary | ICD-10-CM | POA: Insufficient documentation

## 2013-06-26 DIAGNOSIS — Z862 Personal history of diseases of the blood and blood-forming organs and certain disorders involving the immune mechanism: Secondary | ICD-10-CM | POA: Insufficient documentation

## 2013-06-26 DIAGNOSIS — Z8659 Personal history of other mental and behavioral disorders: Secondary | ICD-10-CM | POA: Insufficient documentation

## 2013-06-26 DIAGNOSIS — L739 Follicular disorder, unspecified: Secondary | ICD-10-CM

## 2013-06-26 DIAGNOSIS — D649 Anemia, unspecified: Secondary | ICD-10-CM | POA: Insufficient documentation

## 2013-06-26 DIAGNOSIS — Z8669 Personal history of other diseases of the nervous system and sense organs: Secondary | ICD-10-CM | POA: Insufficient documentation

## 2013-06-26 DIAGNOSIS — Z8619 Personal history of other infectious and parasitic diseases: Secondary | ICD-10-CM | POA: Insufficient documentation

## 2013-06-26 DIAGNOSIS — Z87440 Personal history of urinary (tract) infections: Secondary | ICD-10-CM | POA: Insufficient documentation

## 2013-06-26 DIAGNOSIS — Z79899 Other long term (current) drug therapy: Secondary | ICD-10-CM | POA: Insufficient documentation

## 2013-06-26 MED ORDER — CEPHALEXIN 500 MG PO CAPS: 500.0000 mg | ORAL_CAPSULE | Freq: Four times a day (QID) | ORAL | Status: DC

## 2013-06-26 NOTE — ED Notes (Signed)
PT with singular papule to outer lateral left leg. Intact without oozing. PT states that she has been bitten by spiders in house previously and Had a Family member with MRSA. Unknown etiology.

## 2013-06-26 NOTE — ED Provider Notes (Signed)
CSN: [id number]     Arrival date & time 06/26/13  1134 History  This chart was scribed for non-physician practitioner Magnus Sinning, PA-C working with Loren Racer, MD by Danella Maiers, ED Scribe. This patient was seen in room TR06C/TR06C and the patient's care was started at 1:11 PM.    Chief Complaint  Patient presents with  . Abscess   The history is provided by the patient. No language interpreter was used.   HPI Comments: RUSSIA SCHEIDERER is a 27 y.o. female with a h/o gestational diabetes who presents to the Emergency Department complaining of an abscess to the left lower leg with associated tenderness. She denies h/o abscesses. She denies drainage from the area. Area has become more tender since onset, but has not grown in size.  She denies fever, chills. Her grandmother who lives with her had MRSA recently.  She has not had any treatment prior to arrival.    Past Medical History  Diagnosis Date  . Genital HSV     Last OB: > 26yr ago; Valtrex 500 mg po prn outbreaks  . Thyroid disease   . Mood swings   . Headache(784.0)   . Chlamydia   . Trichomonas   . Anemia   . Gestational diabetes     diet controlled  . Infection     UTI  . Anxiety     off meds with preg  . Panic attack    Past Surgical History  Procedure Laterality Date  . Induced abortion  2009  . Vaginal delivery  2007  . Cesarean section N/A 05/20/2013    Procedure: CESAREAN SECTION;  Surgeon: Brock Bad, MD;  Location: [facility];  Service: Obstetrics;  Laterality: N/A;   Family History  Problem Relation Age of Onset  . Breast cancer Other   . Depression Mother   . Anxiety disorder Mother   . Bipolar disorder Brother   . Diabetes Maternal Grandmother   . Cancer Maternal Grandmother   . Hypertension Maternal Grandmother   . Hypertension Paternal Grandmother   . Autism    . Schizophrenia     History  Substance Use Topics  . Smoking status: Former Games developer  . Smokeless tobacco: Never Used      Comment: was not a daily smoker, tried it  . Alcohol Use: Yes     Comment: occas; not with preg   OB History   Grav Para Term Preterm Abortions TAB SAB Ect Mult Living   7 3 3  4 2 2   1      Review of Systems  All other systems reviewed and are negative.    Allergies  Zofran  Home Medications   Current Outpatient Rx  Name  Route  Sig  Dispense  Refill  . ibuprofen (ADVIL,MOTRIN) 600 MG tablet   Oral   Take 1 tablet (600 mg total) by mouth every 6 (six) hours as needed for pain.   30 tablet   5   . Iron-FA-B Cmp-C-Biot-Probiotic (FUSION PLUS) CAPS   Oral   Take 1 capsule by mouth daily before breakfast.   30 capsule   5   . levonorgestrel (PLAN B) 0.75 MG tablet   Oral   Take 1 tablet (0.75 mg total) by mouth every 12 (twelve) hours.   2 tablet   0   . medroxyPROGESTERone (DEPO-PROVERA) 150 MG/ML injection   Intramuscular   Inject 1 mL (150 mg total) into the muscle every 3 (three) months.  1 mL   0   . oxyCODONE-acetaminophen (PERCOCET/ROXICET) 5-325 MG per tablet   Oral   Take 1-2 tablets by mouth every 4 (four) hours as needed for pain.   30 tablet   0   . cephALEXin (KEFLEX) 500 MG capsule   Oral   Take 1 capsule (500 mg total) by mouth 3 (three) times daily.   21 capsule   0   . valACYclovir (VALTREX) 1000 MG tablet   Oral   Take 1,000 mg by mouth daily.          BP 126/94  Pulse 93  Temp(Src) 99 F (37.2 C) (Oral)  Resp 16  Ht 5\' 7"  (1.702 m)  Wt 220 lb (99.791 kg)  BMI 34.45 kg/m2  SpO2 98% Physical Exam  Nursing note and vitals reviewed. Constitutional: She appears well-developed and well-nourished.  HENT:  Head: Normocephalic and atraumatic.  Mouth/Throat: Oropharynx is clear and moist.  Eyes: EOM are normal. Pupils are equal, round, and reactive to light.  Neck: Normal range of motion. Neck supple.  Cardiovascular: Normal rate, regular rhythm and normal heart sounds.   Pulmonary/Chest: Effort normal and breath sounds normal.  She has no wheezes.  Musculoskeletal: Normal range of motion.  Neurological: She is alert.  Skin: Skin is warm and dry.  Inflammation of the hair follicle of the left lower anterior leg. No drainage.  No fluctuance.  Psychiatric: She has a normal mood and affect. Her behavior is normal.    ED Course  Procedures (including critical care time) Medications - No data to display  DIAGNOSTIC STUDIES: Oxygen Saturation is 98% on room air, normal by my interpretation.    COORDINATION OF CARE: 1:18 PM- Discussed treatment plan with pt which includes antibiotics and pt agrees to plan.    Labs Review Labs Reviewed - No data to display Imaging Review No results found.  MDM  No diagnosis found. Patient with folliculitis of the left lower leg.  No fluctuance present.  Patient given antibiotic.  Return precautions given.  I personally performed the services described in this documentation, which was scribed in my presence. The recorded information has been reviewed and is accurate.    Pascal Lux Clementon, PA-C 06/26/13 1552

## 2013-06-28 NOTE — ED Provider Notes (Signed)
Medical screening examination/treatment/procedure(s) were performed by non-physician practitioner and as supervising physician I was immediately available for consultation/collaboration.   Loren Racer, MD 06/28/13 1505

## 2013-07-07 ENCOUNTER — Ambulatory Visit: Payer: Medicaid Other | Admitting: Advanced Practice Midwife

## 2013-09-14 ENCOUNTER — Ambulatory Visit: Payer: Medicaid Other | Admitting: Obstetrics

## 2013-10-16 ENCOUNTER — Ambulatory Visit: Payer: Medicaid Other | Admitting: Obstetrics

## 2013-10-26 ENCOUNTER — Other Ambulatory Visit: Payer: Self-pay | Admitting: Obstetrics & Gynecology

## 2013-10-26 DIAGNOSIS — Z7251 High risk heterosexual behavior: Secondary | ICD-10-CM

## 2013-10-26 NOTE — Telephone Encounter (Signed)
Rx called to pharmacy

## 2013-11-10 ENCOUNTER — Ambulatory Visit: Payer: Medicaid Other | Admitting: Obstetrics

## 2013-12-24 ENCOUNTER — Ambulatory Visit: Payer: Medicaid Other | Admitting: Obstetrics

## 2014-01-13 ENCOUNTER — Ambulatory Visit: Payer: Medicaid Other | Admitting: Obstetrics

## 2014-04-01 ENCOUNTER — Telehealth: Payer: Self-pay | Admitting: *Deleted

## 2014-04-01 DIAGNOSIS — B009 Herpesviral infection, unspecified: Secondary | ICD-10-CM

## 2014-04-01 MED ORDER — VALACYCLOVIR HCL 500 MG PO TABS: 500.0000 mg | ORAL_TABLET | Freq: Every day | ORAL | Status: DC

## 2014-04-01 NOTE — Telephone Encounter (Signed)
Patient is calling for a refill on her Valtrex. Patient states she uses suppressive therapy and is out. Patient has an appointment Monday- Rx sent to pharmacy.

## 2014-04-05 ENCOUNTER — Encounter (HOSPITAL_COMMUNITY): Payer: Self-pay | Admitting: Emergency Medicine

## 2014-04-05 ENCOUNTER — Other Ambulatory Visit (HOSPITAL_COMMUNITY)
Admission: RE | Admit: 2014-04-05 | Discharge: 2014-04-05 | Disposition: A | Discharge status: Home / Self Care | Payer: Medicaid Other | Source: Ambulatory Visit | Attending: Emergency Medicine | Admitting: Emergency Medicine

## 2014-04-05 ENCOUNTER — Emergency Department (INDEPENDENT_AMBULATORY_CARE_PROVIDER_SITE_OTHER)
Admission: EM | Admit: 2014-04-05 | Discharge: 2014-04-05 | Disposition: A | Discharge status: Home / Self Care | Payer: Medicaid Other | Source: Home / Self Care | Attending: Emergency Medicine | Admitting: Emergency Medicine

## 2014-04-05 DIAGNOSIS — Z113 Encounter for screening for infections with a predominantly sexual mode of transmission: Secondary | ICD-10-CM | POA: Insufficient documentation

## 2014-04-05 DIAGNOSIS — R300 Dysuria: Secondary | ICD-10-CM

## 2014-04-05 DIAGNOSIS — N760 Acute vaginitis: Secondary | ICD-10-CM | POA: Insufficient documentation

## 2014-04-05 DIAGNOSIS — N730 Acute parametritis and pelvic cellulitis: Secondary | ICD-10-CM

## 2014-04-05 LAB — POCT URINALYSIS DIP (DEVICE)
Bilirubin Urine: NEGATIVE
Glucose, UA: NEGATIVE mg/dL
Hgb urine dipstick: NEGATIVE
Ketones, ur: NEGATIVE mg/dL
LEUKOCYTES UA: NEGATIVE
Nitrite: NEGATIVE
PH: 6 (ref 5.0–8.0)
PROTEIN: 30 mg/dL — AB
Specific Gravity, Urine: 1.02 (ref 1.005–1.030)
UROBILINOGEN UA: 1 mg/dL (ref 0.0–1.0)

## 2014-04-05 LAB — POCT PREGNANCY, URINE: Preg Test, Ur: NEGATIVE

## 2014-04-05 MED ORDER — AZITHROMYCIN 250 MG PO TABS
ORAL_TABLET | ORAL | Status: AC
  Filled 2014-04-05: qty 4

## 2014-04-05 MED ORDER — CEPHALEXIN 500 MG PO CAPS: 500.0000 mg | ORAL_CAPSULE | Freq: Three times a day (TID) | ORAL | Status: DC

## 2014-04-05 MED ORDER — DOXYCYCLINE HYCLATE 100 MG PO TABS: 100.0000 mg | ORAL_TABLET | Freq: Two times a day (BID) | ORAL | Status: DC

## 2014-04-05 MED ORDER — HYDROCODONE-ACETAMINOPHEN 5-325 MG PO TABS: ORAL_TABLET | ORAL | Status: DC

## 2014-04-05 MED ORDER — LIDOCAINE HCL (PF) 1 % IJ SOLN
INTRAMUSCULAR | Status: AC
  Filled 2014-04-05: qty 5

## 2014-04-05 MED ORDER — CEFTRIAXONE SODIUM 250 MG IJ SOLR
250.0000 mg | Freq: Once | INTRAMUSCULAR | Status: AC
  Administered 2014-04-05: 250 mg via INTRAMUSCULAR

## 2014-04-05 MED ORDER — AZITHROMYCIN 250 MG PO TABS
1000.0000 mg | ORAL_TABLET | Freq: Once | ORAL | Status: AC
  Administered 2014-04-05: 1000 mg via ORAL

## 2014-04-05 MED ORDER — METRONIDAZOLE 500 MG PO TABS: ORAL_TABLET | ORAL | Status: DC

## 2014-04-05 MED ORDER — CEFTRIAXONE SODIUM 250 MG IJ SOLR
INTRAMUSCULAR | Status: AC
  Filled 2014-04-05: qty 250

## 2014-04-05 NOTE — ED Provider Notes (Signed)
Chief Complaint   Chief Complaint  Patient presents with  . Urinary Tract Infection  . Vaginal Discharge    History of Present Illness   Melinda Jimenez is a 28 year old female who has had a six-day history of left lower quadrant pain. This radiates through to her back. She's noticed some urinary frequency, urgency, and dysuria. She's also had some vaginal odor. She's concerned about STDs, since she had sexual relations with him as a little over a week ago. She's concerned about Trichomonas and she's had this before and this feels the same way as it did when she had Trichomonas. Her last menstrual period was 2 weeks ago. She is sexually active with occasional use of condoms. She had a urinary tract infection about 3 months ago. She denies any fever, chills, nausea, or vomiting. She's had no diarrhea, constipation, or blood in the stool. She denies any hematuria. She's had no vaginal discharge or itching.  Review of Systems   Other than as noted above, the patient denies any of the following symptoms: Systemic:  No fever or chills GI:  No abdominal pain, nausea, vomiting, diarrhea, constipation, melena or hematochezia. GU:  No dysuria, frequency, urgency, hematuria, vaginal discharge, itching, or abnormal vaginal bleeding.  PMFSH   Past medical history, family history, social history, meds, and allergies were reviewed.  She is allergic to Zofran. She takes Valtrex, Xanax, Abilify. She had gestational diabetes and has a thyroid problem.  Physical Examination    Vital signs:  BP 93/71  Pulse 87  Temp(Src) 98.1 F (36.7 C)  Resp 14  SpO2 100%  LMP 03/22/2014  Breastfeeding? No General:  Alert, oriented and in no distress. Lungs:  Breath sounds clear and equal bilaterally.  No wheezes, rales or rhonchi. Heart:  Regular rhythm.  No gallops or murmers. Abdomen:  Soft, flat and non-distended.  No organomegaly or mass.  There was mild tenderness to palpation in the left lower quadrant  without guarding or rebound.  Bowel sounds normally active. Pelvic exam:  Normal external genitalia. Vaginal and cervical mucosa were normal. There was a scant amount of whitish discharge. She has moderate pain on cervical motion. Uterus is normal in size and shape and nontender. There is moderate left adnexal tenderness, no mass. No tenderness or mass in the right.  DNA probes for gonorrhea, Chlamydia, Trichomonas, Gardnerella, Candida were obtained. Skin:  Clear, warm and dry.  Chaperoned by Leta JunglingMarchella Willard, CMA who was present throughout the pelvic exam.   Labs   Results for orders placed during the hospital encounter of 04/05/14  POCT PREGNANCY, URINE      Result Value Ref Range   Preg Test, Ur NEGATIVE  NEGATIVE  POCT URINALYSIS DIP (DEVICE)      Result Value Ref Range   Glucose, UA NEGATIVE  NEGATIVE mg/dL   Bilirubin Urine NEGATIVE  NEGATIVE   Ketones, ur NEGATIVE  NEGATIVE mg/dL   Specific Gravity, Urine 1.020  1.005 - 1.030   Hgb urine dipstick NEGATIVE  NEGATIVE   pH 6.0  5.0 - 8.0   Protein, ur 30 (*) NEGATIVE mg/dL   Urobilinogen, UA 1.0  0.0 - 1.0 mg/dL   Nitrite NEGATIVE  NEGATIVE   Leukocytes, UA NEGATIVE  NEGATIVE     Course in Urgent Care Center   The patient desires to be treated for STDs, therefore she was given Rocephin 250 mg IM and azithromycin 1000 mg by mouth.  Assessment   The primary encounter diagnosis was PID (acute  pelvic inflammatory disease). A diagnosis of Dysuria was also pertinent to this visit.       Plan    1.  Meds:  The following meds were prescribed:   Discharge Medication List as of 04/05/2014  6:14 PM    START taking these medications   Details  !! cephALEXin (KEFLEX) 500 MG capsule Take 1 capsule (500 mg total) by mouth 3 (three) times daily., Starting 04/05/2014, Until Discontinued, Normal    doxycycline (VIBRA-TABS) 100 MG tablet Take 1 tablet (100 mg total) by mouth 2 (two) times daily., Starting 04/05/2014, Until Discontinued,  Normal    HYDROcodone-acetaminophen (NORCO/VICODIN) 5-325 MG per tablet 1 to 2 tabs every 4 to 6 hours as needed for pain., Print    metroNIDAZOLE (FLAGYL) 500 MG tablet Take all 4 pills at one time., Normal     !! - Potential duplicate medications found. Please discuss with provider.      2.  Patient Education/Counseling:  The patient was given appropriate handouts, self care instructions, and instructed in symptomatic relief.    3.  Follow up:  The patient was told to follow up here if no better in 3 to 4 days, or sooner if becoming worse in any way, and given some red flag symptoms such as worsening pain, fever, persistent vomiting, or heavy vaginal bleeding which would prompt immediate return.       Reuben Likesavid C Sherilee Smotherman, MD 04/05/14 [phone]

## 2014-04-05 NOTE — ED Notes (Signed)
C/o urinary frequency with left flank pain.  Vaginal odor with white discharge. X 6 days.   Denies fever, n/v   No otc treatments tried.

## 2014-04-05 NOTE — Discharge Instructions (Signed)
Pelvic Inflammatory Disease °Pelvic inflammatory disease (PID) refers to an infection in some or all of the female organs. The infection can be in the uterus, ovaries, fallopian tubes, or the surrounding tissues in the pelvis. PID can cause abdominal or pelvic pain that comes on suddenly (acute pelvic pain). PID is a serious infection because it can lead to lasting (chronic) pelvic pain or the inability to have children (infertile).  °CAUSES  °The infection is often caused by the normal bacteria found in the vaginal tissues. PID may also be caused by an infection that is spread during sexual contact. PID can also occur following:  °· The birth of a baby.   °· A miscarriage.   °· An abortion.   °· Major pelvic surgery.   °· The use of an intrauterine device (IUD).   °· A sexual assault.   °RISK FACTORS °Certain factors can put a person at higher risk for PID, such as: °· Being younger than 25 years. °· Being sexually active at a young age. °· Using nonbarrier contraception. °· Having multiple sexual partners. °· Having sex with someone who has symptoms of a genital infection. °· Using oral contraception. °Other times, certain behaviors can increase the possibility of getting PID, such as: °· Having sex during your period. °· Using a vaginal douche. °· Having an intrauterine device (IUD) in place. °SYMPTOMS  °· Abdominal or pelvic pain.   °· Fever.   °· Chills.   °· Abnormal vaginal discharge. °· Abnormal uterine bleeding.   °· Unusual pain shortly after finishing your period. °DIAGNOSIS  °Your caregiver will choose some of the following methods to make a diagnosis, such as:  °· Performing a physical exam and history. A pelvic exam typically reveals a very tender uterus and surrounding pelvis.   °· Ordering laboratory tests including a pregnancy test, blood tests, and urine test.  °· Ordering cultures of the vagina and cervix to check for a sexually transmitted infection (STI). °· Performing an ultrasound.    °· Performing a laparoscopic procedure to look inside the pelvis.   °TREATMENT  °· Antibiotic medicines may be prescribed and taken by mouth.   °· Sexual partners may be treated when the infection is caused by a sexually transmitted disease (STD).   °· Hospitalization may be needed to give antibiotics intravenously. °· Surgery may be needed, but this is rare. °It may take weeks until you are completely well. If you are diagnosed with PID, you should also be checked for human immunodeficiency virus (HIV).   °HOME CARE INSTRUCTIONS  °· If given, take your antibiotics as directed. Finish the medicine even if you start to feel better.   °· Only take over-the-counter or prescription medicines for pain, discomfort, or fever as directed by your caregiver.   °· Do not have sexual intercourse until treatment is completed or as directed by your caregiver. If PID is confirmed, your recent sexual partner(s) will need treatment.   °· Keep your follow-up appointments. °SEEK MEDICAL CARE IF:  °· You have increased or abnormal vaginal discharge.   °· You need prescription medicine for your pain.   °· You vomit.   °· You cannot take your medicines.   °· Your partner has an STD.   °SEEK IMMEDIATE MEDICAL CARE IF:  °· You have a fever.   °· You have increased abdominal or pelvic pain.   °· You have chills.   °· You have pain when you urinate.   °· You are not better after 72 hours following treatment.   °MAKE SURE YOU:  °· Understand these instructions. °· Will watch your condition. °· Will get help right away if you are not doing well or get worse. °  Document Released: 09/17/2005 Document Revised: 01/12/2013 Document Reviewed: 09/13/2011 °ExitCare® Patient Information ©2015 [employer]. This information is not intended to replace advice given to you by your health care provider. Make sure you discuss any questions you have with your health care provider. ° °

## 2014-04-06 LAB — URINE CULTURE
Colony Count: 25000
Special Requests: NORMAL

## 2014-06-17 ENCOUNTER — Telehealth: Payer: Self-pay | Admitting: *Deleted

## 2014-06-17 DIAGNOSIS — Z30013 Encounter for initial prescription of injectable contraceptive: Secondary | ICD-10-CM

## 2014-06-17 DIAGNOSIS — Z7251 High risk heterosexual behavior: Secondary | ICD-10-CM

## 2014-06-17 MED ORDER — LEVONORGESTREL 0.75 MG PO TABS: 0.7500 mg | ORAL_TABLET | Freq: Two times a day (BID) | ORAL | Status: DC

## 2014-06-17 MED ORDER — MEDROXYPROGESTERONE ACETATE 150 MG/ML IM SUSP: 150.0000 mg | INTRAMUSCULAR | Status: DC

## 2014-06-17 NOTE — Telephone Encounter (Signed)
Patient is calling requesting plan B and Depo Rx. 3:05  Spoke to patient - she had unprotected intercourse yesterday and she wants to start Depo for birth control.  Per Dr Clearance Coots may call in plan B- with instruction to do UPT 2 weeks after treatment. May also call in Depo and schedule patient for annual exam. Depending on cycle and compliance with protected intercourse- patient may be able to receive shot at annual exam or may have to do 2 week Depo protocol. Patient is aware.

## 2014-07-12 ENCOUNTER — Encounter (HOSPITAL_COMMUNITY): Payer: Self-pay | Admitting: Emergency Medicine

## 2014-07-12 ENCOUNTER — Emergency Department (HOSPITAL_COMMUNITY)
Admission: EM | Admit: 2014-07-12 | Discharge: 2014-07-12 | Disposition: A | Discharge status: Home / Self Care | Payer: Medicaid Other | Attending: Emergency Medicine | Admitting: Emergency Medicine

## 2014-07-12 DIAGNOSIS — Z79899 Other long term (current) drug therapy: Secondary | ICD-10-CM | POA: Insufficient documentation

## 2014-07-12 DIAGNOSIS — Z87891 Personal history of nicotine dependence: Secondary | ICD-10-CM | POA: Insufficient documentation

## 2014-07-12 DIAGNOSIS — Z792 Long term (current) use of antibiotics: Secondary | ICD-10-CM | POA: Diagnosis not present

## 2014-07-12 DIAGNOSIS — F419 Anxiety disorder, unspecified: Secondary | ICD-10-CM | POA: Insufficient documentation

## 2014-07-12 DIAGNOSIS — Z8619 Personal history of other infectious and parasitic diseases: Secondary | ICD-10-CM | POA: Diagnosis not present

## 2014-07-12 DIAGNOSIS — Z8632 Personal history of gestational diabetes: Secondary | ICD-10-CM | POA: Diagnosis not present

## 2014-07-12 DIAGNOSIS — J069 Acute upper respiratory infection, unspecified: Secondary | ICD-10-CM | POA: Insufficient documentation

## 2014-07-12 DIAGNOSIS — D649 Anemia, unspecified: Secondary | ICD-10-CM | POA: Diagnosis not present

## 2014-07-12 DIAGNOSIS — Z8639 Personal history of other endocrine, nutritional and metabolic disease: Secondary | ICD-10-CM | POA: Diagnosis not present

## 2014-07-12 DIAGNOSIS — R05 Cough: Secondary | ICD-10-CM | POA: Diagnosis present

## 2014-07-12 MED ORDER — AMOXICILLIN 500 MG PO CAPS: 1000.0000 mg | ORAL_CAPSULE | Freq: Two times a day (BID) | ORAL | Status: DC

## 2014-07-12 MED ORDER — ALBUTEROL SULFATE HFA 108 (90 BASE) MCG/ACT IN AERS: 1.0000 | INHALATION_SPRAY | Freq: Four times a day (QID) | RESPIRATORY_TRACT | Status: DC | PRN

## 2014-07-12 NOTE — Discharge Instructions (Signed)
Upper Respiratory Infection, Adult An upper respiratory infection (URI) is also sometimes known as the common cold. The upper respiratory tract includes the nose, sinuses, throat, trachea, and bronchi. Bronchi are the airways leading to the lungs. Most people improve within 1 week, but symptoms can last up to 2 weeks. A residual cough may last even longer.  CAUSES Many different viruses can infect the tissues lining the upper respiratory tract. The tissues become irritated and inflamed and often become very moist. Mucus production is also common. A cold is contagious. You can easily spread the virus to others by oral contact. This includes kissing, sharing a glass, coughing, or sneezing. Touching your mouth or nose and then touching a surface, which is then touched by another person, can also spread the virus. SYMPTOMS  Symptoms typically develop 1 to 3 days after you come in contact with a cold virus. Symptoms vary from person to person. They may include:  Runny nose.  Sneezing.  Nasal congestion.  Sinus irritation.  Sore throat.  Loss of voice (laryngitis).  Cough.  Fatigue.  Muscle aches.  Loss of appetite.  Headache.  Low-grade fever. DIAGNOSIS  You might diagnose your own cold based on familiar symptoms, since most people get a cold 2 to 3 times a year. Your caregiver can confirm this based on your exam. Most importantly, your caregiver can check that your symptoms are not due to another disease such as strep throat, sinusitis, pneumonia, asthma, or epiglottitis. Blood tests, throat tests, and X-rays are not necessary to diagnose a common cold, but they may sometimes be helpful in excluding other more serious diseases. Your caregiver will decide if any further tests are required. RISKS AND COMPLICATIONS  You may be at risk for a more severe case of the common cold if you smoke cigarettes, have chronic heart disease (such as heart failure) or lung disease (such as asthma), or if  you have a weakened immune system. The very young and very old are also at risk for more serious infections. Bacterial sinusitis, middle ear infections, and bacterial pneumonia can complicate the common cold. The common cold can worsen asthma and chronic obstructive pulmonary disease (COPD). Sometimes, these complications can require emergency medical care and may be life-threatening. PREVENTION  The best way to protect against getting a cold is to practice good hygiene. Avoid oral or hand contact with people with cold symptoms. Wash your hands often if contact occurs. There is no clear evidence that vitamin C, vitamin E, echinacea, or exercise reduces the chance of developing a cold. However, it is always recommended to get plenty of rest and practice good nutrition. TREATMENT  Treatment is directed at relieving symptoms. There is no cure. Antibiotics are not effective, because the infection is caused by a virus, not by bacteria. Treatment may include:  Increased fluid intake. Sports drinks offer valuable electrolytes, sugars, and fluids.  Breathing heated mist or steam (vaporizer or shower).  Eating chicken soup or other clear broths, and maintaining good nutrition.  Getting plenty of rest.  Using gargles or lozenges for comfort.  Controlling fevers with ibuprofen or acetaminophen as directed by your caregiver.  Increasing usage of your inhaler if you have asthma. Zinc gel and zinc lozenges, taken in the first 24 hours of the common cold, can shorten the duration and lessen the severity of symptoms. Pain medicines may help with fever, muscle aches, and throat pain. A variety of non-prescription medicines are available to treat congestion and runny nose. Your caregiver   can make recommendations and may suggest nasal or lung inhalers for other symptoms.  HOME CARE INSTRUCTIONS   Only take over-the-counter or prescription medicines for pain, discomfort, or fever as directed by your  caregiver.  Use a warm mist humidifier or inhale steam from a shower to increase air moisture. This may keep secretions moist and make it easier to breathe.  Drink enough water and fluids to keep your urine clear or pale yellow.  Rest as needed.  Return to work when your temperature has returned to normal or as your caregiver advises. You may need to stay home longer to avoid infecting others. You can also use a face mask and careful hand washing to prevent spread of the virus. SEEK MEDICAL CARE IF:   After the first few days, you feel you are getting worse rather than better.  You need your caregiver's advice about medicines to control symptoms.  You develop chills, worsening shortness of breath, or brown or red sputum. These may be signs of pneumonia.  You develop yellow or brown nasal discharge or pain in the face, especially when you bend forward. These may be signs of sinusitis.  You develop a fever, swollen neck glands, pain with swallowing, or white areas in the back of your throat. These may be signs of strep throat. SEEK IMMEDIATE MEDICAL CARE IF:   You have a fever.  You develop severe or persistent headache, ear pain, sinus pain, or chest pain.  You develop wheezing, a prolonged cough, cough up blood, or have a change in your usual mucus (if you have chronic lung disease).  You develop sore muscles or a stiff neck. Document Released: 03/13/2001 Document Revised: 12/10/2011 Document Reviewed: 12/23/2013 ExitCare Patient Information 2015 [employer]. This information is not intended to replace advice given to you by your health care provider. Make sure you discuss any questions you have with your health care provider.  

## 2014-07-12 NOTE — ED Notes (Signed)
Cold like symptoms: productive cough, pleurisy with cough, fevers, body aches, and occ. Nausea.

## 2014-07-14 NOTE — ED Provider Notes (Signed)
CSN: [id number]     Arrival date & time 07/12/14  1222 History   First MD Initiated Contact with Patient 07/12/14 1331     Chief Complaint  Patient presents with  . Cough  . Nasal Congestion     (Consider location/radiation/quality/duration/timing/severity/associated sxs/prior Treatment) Patient is a 28 y.o. female presenting with cough. The history is provided by the patient. No language interpreter was used.  Cough Cough characteristics:  Productive Severity:  Moderate Onset quality:  Gradual Duration:  3 days Smoker: no   Associated symptoms: chills and rhinorrhea   Associated symptoms: no fever, no myalgias, no rash and no shortness of breath   Associated symptoms comment:  Cough, chest tightness and congestion for 3 days. She has felt hot/cold chills so presumes a fever but has not taken her temperature. No N, V. She denies SOB. No history of asthma.   Past Medical History  Diagnosis Date  . 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 Thyroid disease   . Mood swings   . Headache(784.0)   . Chlamydia   . Trichomonas   . Anemia   . Gestational diabetes     diet controlled  . Infection     UTI  . Anxiety     off meds with preg  . Panic attack    Past Surgical History  Procedure Laterality Date  . Induced abortion  2009  . Vaginal delivery  2007  . Cesarean section N/A 05/20/2013    Procedure: CESAREAN SECTION;  Surgeon: Brock Bad, MD;  Location: [facility];  Service: Obstetrics;  Laterality: N/A;   Family History  Problem Relation Age of Onset  . Breast cancer Other   . Depression Mother   . Anxiety disorder Mother   . Bipolar disorder Brother   . Diabetes Maternal Grandmother   . Cancer Maternal Grandmother   . Hypertension Maternal Grandmother   . Hypertension Paternal Grandmother   . Autism    . Schizophrenia     History  Substance Use Topics  . Smoking status: Former Games developer  . Smokeless tobacco: Never Used   Comment: was not a daily smoker, tried it  . Alcohol Use: Yes     Comment: occas; not with preg   OB History   Grav Para Term Preterm Abortions TAB SAB Ect Mult Living   7 3 3  4 2 2   1      Review of Systems  Constitutional: Positive for chills. Negative for fever.  HENT: Positive for congestion, rhinorrhea and sinus pressure. Negative for trouble swallowing.   Respiratory: Positive for cough and chest tightness. Negative for shortness of breath.   Cardiovascular: Negative.   Gastrointestinal: Negative.  Negative for vomiting and abdominal pain.  Musculoskeletal: Negative.  Negative for myalgias.  Skin: Negative.  Negative for rash.  Neurological: Negative.       Allergies  Zofran  Home Medications   Prior to Admission medications   Medication Sig Start Date End Date Taking? Authorizing Provider  albuterol (PROVENTIL HFA;VENTOLIN HFA) 108 (90 BASE) MCG/ACT inhaler Inhale 1-2 puffs into the lungs every 6 (six) hours as needed for wheezing or shortness of breath. 07/12/14   Stephonie Wilcoxen A Lavi Sheehan, PA-C  ALPRAZolam (XANAX) 1 MG tablet Take 1 mg by mouth at bedtime as needed for anxiety.    Historical Provider, MD  amoxicillin (AMOXIL) 500 MG capsule Take 2 capsules (1,000 mg total) by mouth 2 (  two) times daily. 07/12/14   Shamya Macfadden A Iviana Blasingame, PA-C  ARIPiprazole (ABILIFY PO) Take by mouth.    Historical Provider, MD  cephALEXin (KEFLEX) 500 MG capsule Take 1 capsule (500 mg total) by mouth 3 (three) times daily. 05/26/13   [name], MD  cephALEXin (KEFLEX) 500 MG capsule Take 1 capsule (500 mg total) by mouth 4 (four) times daily. 06/26/13   Heather Laisure, PA-C  cephALEXin (KEFLEX) 500 MG capsule Take 1 capsule (500 mg total) by mouth 3 (three) times daily. 04/05/14   Reuben Likesavid C Keller, MD  doxycycline (VIBRA-TABS) 100 MG tablet Take 1 tablet (100 mg total) by mouth 2 (two) times daily. 04/05/14   Reuben Likesavid C Keller, MD  HYDROcodone-acetaminophen (NORCO/VICODIN) 5-325 MG per tablet 1 to 2 tabs  every 4 to 6 hours as needed for pain. 04/05/14   Reuben Likesavid C Keller, MD  ibuprofen (ADVIL,MOTRIN) 600 MG tablet Take 1 tablet (600 mg total) by mouth every 6 (six) hours as needed for pain. 05/23/13   [name], MD  Iron-FA-B Cmp-C-Biot-Probiotic (FUSION PLUS) CAPS Take 1 capsule by mouth daily before breakfast. 05/23/13   [name], MD  levonorgestrel (PLAN B) 0.75 MG tablet Take 1 tablet (0.75 mg total) by mouth every 12 (twelve) hours. 06/17/14   [name], MD  medroxyPROGESTERone (DEPO-PROVERA) 150 MG/ML injection Inject 1 mL (150 mg total) into the muscle every 3 (three) months. 06/17/14   [name], MD  metroNIDAZOLE (FLAGYL) 500 MG tablet Take all 4 pills at one time. 04/05/14   Reuben Likesavid C Keller, MD  oxyCODONE-acetaminophen (PERCOCET/ROXICET) 5-325 MG per tablet Take 1-2 tablets by mouth every 4 (four) hours as needed for pain. 05/23/13   [name], MD  valACYclovir (VALTREX) 1000 MG tablet Take 1,000 mg by mouth daily.    Historical Provider, MD  valACYclovir (VALTREX) 500 MG tablet Take 1 tablet (500 mg total) by mouth daily. May use bid for 3 days for outbreak. 04/01/14   Antionette CharLisa Jackson-Moore, MD   BP 110/77  Pulse 99  Temp(Src) 98.6 F (37 C) (Oral)  Resp 22  SpO2 100%  LMP 06/01/2014 Physical Exam  Constitutional: She is oriented to person, place, and time. She appears well-developed and well-nourished.  HENT:  Head: Normocephalic.  Nose: Mucosal edema present.  Mouth/Throat: Mucous membranes are normal. Posterior oropharyngeal erythema present. No posterior oropharyngeal edema.  Neck: Normal range of motion. Neck supple.  Cardiovascular: Normal rate and regular rhythm.   Pulmonary/Chest: Effort normal.  Scattered rhonchi.  Abdominal: Soft. Bowel sounds are normal. There is no tenderness. There is no rebound and no guarding.  Musculoskeletal: Normal range of motion.  Neurological: She is alert and oriented to person, place, and time.  Skin: Skin is warm and  dry. No rash noted.  Psychiatric: She has a normal mood and affect.    ED Course  Procedures (including critical care time) Labs Review Labs Reviewed - No data to display  Imaging Review No results found.   EKG Interpretation   Date/Time:  Monday July 12 2014 12:35:44 EDT Ventricular Rate:  105 PR Interval:  126 QRS Duration: 84 QT Interval:  322 QTC Calculation: 425 R Axis:   56 Text Interpretation:  Sinus tachycardia Otherwise normal ECG ED PHYSICIAN  INTERPRETATION AVAILABLE IN [facility] Confirmed by TEST, Record  ([id number]) on 07/14/2014 7:22:46 AM      MDM   Final diagnoses:  URI (upper respiratory infection)    1. Will treat based  on symptoms with Amoxil. Patient is non-toxic in appearance. Stable for outpatient follow up.    Arnoldo HookerShari A Skeeter Sheard, PA-C 07/14/14 1851

## 2014-07-17 NOTE — ED Provider Notes (Signed)
Medical screening examination/treatment/procedure(s) were performed by non-physician practitioner and as supervising physician I was immediately available for consultation/collaboration.   EKG Interpretation   Date/Time:  Monday July 12 2014 12:35:44 EDT Ventricular Rate:  105 PR Interval:  126 QRS Duration: 84 QT Interval:  322 QTC Calculation: 425 R Axis:   56 Text Interpretation:  Sinus tachycardia Otherwise normal ECG ED PHYSICIAN  INTERPRETATION AVAILABLE IN [facility] Confirmed by TEST, Record  ([id number]) on 07/14/2014 7:22:46 AM        Purvis SheffieldForrest Aubrianne Molyneux, MD 07/17/14 1149

## 2014-07-20 ENCOUNTER — Ambulatory Visit (INDEPENDENT_AMBULATORY_CARE_PROVIDER_SITE_OTHER): Payer: Medicaid Other | Admitting: Obstetrics

## 2014-07-20 DIAGNOSIS — Z0000 Encounter for general adult medical examination without abnormal findings: Secondary | ICD-10-CM

## 2014-07-26 ENCOUNTER — Encounter (HOSPITAL_COMMUNITY): Payer: Self-pay | Admitting: *Deleted

## 2014-07-26 ENCOUNTER — Inpatient Hospital Stay (HOSPITAL_COMMUNITY): Payer: Medicaid Other

## 2014-07-26 ENCOUNTER — Inpatient Hospital Stay (HOSPITAL_COMMUNITY)
Admission: AD | Admit: 2014-07-26 | Discharge: 2014-07-26 | Disposition: A | Discharge status: Home / Self Care | Payer: Medicaid Other | Source: Ambulatory Visit | Attending: Obstetrics and Gynecology | Admitting: Obstetrics and Gynecology

## 2014-07-26 DIAGNOSIS — R109 Unspecified abdominal pain: Secondary | ICD-10-CM | POA: Diagnosis not present

## 2014-07-26 DIAGNOSIS — M549 Dorsalgia, unspecified: Secondary | ICD-10-CM

## 2014-07-26 DIAGNOSIS — O99891 Other specified diseases and conditions complicating pregnancy: Secondary | ICD-10-CM

## 2014-07-26 DIAGNOSIS — O26891 Other specified pregnancy related conditions, first trimester: Secondary | ICD-10-CM | POA: Insufficient documentation

## 2014-07-26 DIAGNOSIS — M545 Low back pain: Secondary | ICD-10-CM | POA: Diagnosis not present

## 2014-07-26 DIAGNOSIS — Z3A01 Less than 8 weeks gestation of pregnancy: Secondary | ICD-10-CM | POA: Diagnosis not present

## 2014-07-26 DIAGNOSIS — O9989 Other specified diseases and conditions complicating pregnancy, childbirth and the puerperium: Secondary | ICD-10-CM

## 2014-07-26 DIAGNOSIS — R1030 Lower abdominal pain, unspecified: Secondary | ICD-10-CM

## 2014-07-26 LAB — URINALYSIS, ROUTINE W REFLEX MICROSCOPIC
Bilirubin Urine: NEGATIVE
Glucose, UA: NEGATIVE mg/dL
Hgb urine dipstick: NEGATIVE
Ketones, ur: 15 mg/dL — AB
Leukocytes, UA: NEGATIVE
Nitrite: NEGATIVE
PROTEIN: 30 mg/dL — AB
Specific Gravity, Urine: >1.03 — ABNORMAL HIGH (ref 1.005–1.030)
UROBILINOGEN UA: 0.2 mg/dL (ref 0.0–1.0)
pH: 5.5 (ref 5.0–8.0)

## 2014-07-26 LAB — WET PREP, GENITAL
CLUE CELLS WET PREP: NONE SEEN
TRICH WET PREP: NONE SEEN
YEAST WET PREP: NONE SEEN

## 2014-07-26 LAB — CBC
HEMATOCRIT: 34.3 % — AB (ref 36.0–46.0)
HEMOGLOBIN: 10.9 g/dL — AB (ref 12.0–15.0)
MCH: 23.3 pg — AB (ref 26.0–34.0)
MCHC: 31.8 g/dL (ref 30.0–36.0)
MCV: 73.4 fL — AB (ref 78.0–100.0)
Platelets: 314 10*3/uL (ref 150–400)
RBC: 4.67 MIL/uL (ref 3.87–5.11)
RDW: 18.2 % — ABNORMAL HIGH (ref 11.5–15.5)
WBC: 14.6 10*3/uL — ABNORMAL HIGH (ref 4.0–10.5)

## 2014-07-26 LAB — HCG, QUANTITATIVE, PREGNANCY: hCG, Beta Chain, Quant, S: 1217 m[IU]/mL — ABNORMAL HIGH (ref <5)

## 2014-07-26 LAB — URINE MICROSCOPIC-ADD ON

## 2014-07-26 LAB — POCT PREGNANCY, URINE: PREG TEST UR: POSITIVE — AB

## 2014-07-26 MED ORDER — CYCLOBENZAPRINE HCL 10 MG PO TABS: 10.0000 mg | ORAL_TABLET | Freq: Three times a day (TID) | ORAL | Status: DC | PRN

## 2014-07-26 NOTE — MAU Note (Addendum)
LLQ pain, UPT + results today. Back pain, PLAN B  IN SEPT, FOLLOWED WITH BLEEDING AND THEN   UNPROTECTED  SEX, LMP EARLY PART OF SEPT , DATE UNKNOW

## 2014-07-26 NOTE — MAU Provider Note (Signed)
History     CSN: [id number]  Arrival date and time: 07/26/14 1440   None     Chief Complaint  Patient presents with  . Back Pain  . Abdominal Pain    LLQ   HPI TIMERA WINDT 28 y.o. [id number] presents to MAU suspecting pregnancy and concerned about onset of pain in abdomen and back x 3 days.  She has used tylenol for her pain without success.  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  She is not used to this pain prior to this week.  She denies vaginal bleeding, discharge, fever, weakness.  She is having hot flashes, nausea, vomiting, headache.   OB History   Grav Para Term Preterm Abortions TAB SAB Ect Mult Living   8 3 3  4 2 2   1       Past Medical History  Diagnosis Date  . Genital HSV     Last OB: > 56yr ago; Valtrex 500 mg po prn outbreaks  . Thyroid disease   . Mood swings   . Headache(784.0)   . Chlamydia   . Trichomonas   . Anemia   . Gestational diabetes     diet controlled  . Infection     UTI  . Anxiety     off meds with preg  . Panic attack     Past Surgical History  Procedure Laterality Date  . Induced abortion  2009  . Vaginal delivery  2007  . Cesarean section N/A 05/20/2013    Procedure: CESAREAN SECTION;  Surgeon: Brock Bad, MD;  Location: [facility];  Service: Obstetrics;  Laterality: N/A;    Family History  Problem Relation Age of Onset  . Breast cancer Other   . Depression Mother   . Anxiety disorder Mother   . Bipolar disorder Brother   . Diabetes Maternal Grandmother   . Cancer Maternal Grandmother   . Hypertension Maternal Grandmother   . Hypertension Paternal Grandmother   . Autism    . Schizophrenia      History  Substance Use Topics  . Smoking status: Former Games developer  . Smokeless tobacco: Never Used     Comment: was not a daily smoker, tried it  . Alcohol Use: Yes     Comment: occas; not with preg    Allergies:  Allergies  Allergen Reactions  . Zofran [Ondansetron Hcl] Nausea And Vomiting     Prescriptions prior to admission  Medication Sig Dispense Refill  . ALPRAZolam (XANAX) 1 MG tablet Take 1 mg by mouth at bedtime as needed for anxiety.      . valACYclovir (VALTREX) 1000 MG tablet Take 1,000 mg by mouth daily.      Marland Kitchen albuterol (PROVENTIL HFA;VENTOLIN HFA) 108 (90 BASE) MCG/ACT inhaler Inhale 1-2 puffs into the lungs every 6 (six) hours as needed for wheezing or shortness of breath.  1 Inhaler  0  . medroxyPROGESTERone (DEPO-PROVERA) 150 MG/ML injection Inject 1 mL (150 mg total) into the muscle every 3 (three) months.  1 mL  3  . valACYclovir (VALTREX) 500 MG tablet Take 1 tablet (500 mg total) by mouth daily. May use bid for 3 days for outbreak.  40 tablet  prn  . [DISCONTINUED] cephALEXin (KEFLEX) 500 MG capsule Take 1 capsule (500 mg total) by mouth 3 (three) times daily.  21 capsule  0  . [DISCONTINUED] cephALEXin (KEFLEX) 500 MG capsule Take 1 capsule (500 mg total) by mouth 4 (four) times daily.  28 capsule  0  . [DISCONTINUED] cephALEXin (KEFLEX) 500 MG capsule Take 1 capsule (500 mg total) by mouth 3 (three) times daily.  30 capsule  0  . [DISCONTINUED] doxycycline (VIBRA-TABS) 100 MG tablet Take 1 tablet (100 mg total) by mouth 2 (two) times daily.  20 tablet  0  . [DISCONTINUED] HYDROcodone-acetaminophen (NORCO/VICODIN) 5-325 MG per tablet 1 to 2 tabs every 4 to 6 hours as needed for pain.  20 tablet  0  . [DISCONTINUED] ibuprofen (ADVIL,MOTRIN) 600 MG tablet Take 1 tablet (600 mg total) by mouth every 6 (six) hours as needed for pain.  30 tablet  5  . [DISCONTINUED] Iron-FA-B Cmp-C-Biot-Probiotic (FUSION PLUS) CAPS Take 1 capsule by mouth daily before breakfast.  30 capsule  5  . [DISCONTINUED] levonorgestrel (PLAN B) 0.75 MG tablet Take 1 tablet (0.75 mg total) by mouth every 12 (twelve) hours.  2 tablet  0  . [DISCONTINUED] metroNIDAZOLE (FLAGYL) 500 MG tablet Take all 4 pills at one time.  4 tablet  0  . [DISCONTINUED] oxyCODONE-acetaminophen (PERCOCET/ROXICET)  5-325 MG per tablet Take 1-2 tablets by mouth every 4 (four) hours as needed for pain.  30 tablet  0    ROS  Pertinent ROS in HPI Physical Exam   Blood pressure 131/66, pulse 81, temperature 98.6 F (37 C), resp. rate 18, last menstrual period 06/01/2014, not currently breastfeeding.  Physical Exam  Constitutional: She is oriented to person, place, and time. She appears well-developed and well-nourished. No distress.  HENT:  Head: Normocephalic and atraumatic.  Eyes: EOM are normal.  Neck: Normal range of motion.  Cardiovascular: Normal rate, regular rhythm and normal heart sounds.   Respiratory: Effort normal and breath sounds normal. No respiratory distress.  GI: Soft. Bowel sounds are normal. She exhibits no distension. There is no tenderness.  Genitourinary:  Vagina with moderate amount of white homogenous malodorous discharge.  No CMT/adnexal mass or tenderness  Musculoskeletal:  Tenderness in paraspinal muscles of lower back  Neurological: She is alert and oriented to person, place, and time.  Skin: Skin is warm and dry.  Psychiatric: She has a normal mood and affect.   Results for orders placed during the hospital encounter of 07/26/14 (from the past 24 hour(s))  URINALYSIS, ROUTINE W REFLEX MICROSCOPIC     Status: Abnormal   Collection Time    07/26/14  3:38 PM      Result Value Ref Range   Color, Urine YELLOW  YELLOW   APPearance CLEAR  CLEAR   Specific Gravity, Urine >1.030 (*) 1.005 - 1.030   pH 5.5  5.0 - 8.0   Glucose, UA NEGATIVE  NEGATIVE mg/dL   Hgb urine dipstick NEGATIVE  NEGATIVE   Bilirubin Urine NEGATIVE  NEGATIVE   Ketones, ur 15 (*) NEGATIVE mg/dL   Protein, ur 30 (*) NEGATIVE mg/dL   Urobilinogen, UA 0.2  0.0 - 1.0 mg/dL   Nitrite NEGATIVE  NEGATIVE   Leukocytes, UA NEGATIVE  NEGATIVE  URINE MICROSCOPIC-ADD ON     Status: Abnormal   Collection Time    07/26/14  3:38 PM      Result Value Ref Range   Squamous Epithelial / LPF MANY (*) RARE   WBC,  UA 3-6  <3 WBC/hpf   RBC / HPF 0-2  <3 RBC/hpf   Bacteria, UA FEW (*) RARE  POCT PREGNANCY, URINE     Status: Abnormal   Collection Time    07/26/14  3:43 PM  Result Value Ref Range   Preg Test, Ur POSITIVE (*) NEGATIVE  CBC     Status: Abnormal   Collection Time    07/26/14  6:18 PM      Result Value Ref Range   WBC 14.6 (*) 4.0 - 10.5 K/uL   RBC 4.67  3.87 - 5.11 MIL/uL   Hemoglobin 10.9 (*) 12.0 - 15.0 g/dL   HCT 16.134.3 (*) 09.636.0 - 04.546.0 %   MCV 73.4 (*) 78.0 - 100.0 fL   MCH 23.3 (*) 26.0 - 34.0 pg   MCHC 31.8  30.0 - 36.0 g/dL   RDW 40.918.2 (*) 81.111.5 - 91.415.5 %   Platelets 314  150 - 400 K/uL  HCG, QUANTITATIVE, PREGNANCY     Status: Abnormal   Collection Time    07/26/14  6:18 PM      Result Value Ref Range   hCG, Beta Chain, Quant, S 1217 (*) <5 mIU/mL  WET PREP, GENITAL     Status: Abnormal   Collection Time    07/26/14  6:57 PM      Result Value Ref Range   Yeast Wet Prep HPF POC NONE SEEN  NONE SEEN   Trich, Wet Prep NONE SEEN  NONE SEEN   Clue Cells Wet Prep HPF POC NONE SEEN  NONE SEEN   WBC, Wet Prep HPF POC FEW (*) NONE SEEN    MAU Course  Procedures  MDM No ectopic visualized on ultrasound.  Early pregnancy at 4weeks suggested per prelim report.  No obvious sign of infection on u/a or wet prep Pt did not appear to be uncomfortable but was offered medication in MAU.  She declined as she did not want to stay for the observation period.    Assessment and Plan  A:  1. Abdominal pain   2. Back pain affecting pregnancy   3. Lower abdominal pain    P: Discharge to home Return in 2 days for Quant HCG levels Flexeril rx for back pain.   [facility] Clearance CootsHarper Increase po fluids Ectopic precautions discussed.  Return to MAU for vaginal bleeding/abdominal pain/emergency  Bertram Denvereague Clark, Lulani Bour E 07/26/2014, 5:53 PM

## 2014-07-26 NOTE — Discharge Instructions (Signed)

## 2014-07-27 LAB — RPR

## 2014-07-28 LAB — GC/CHLAMYDIA PROBE AMP
CT Probe RNA: NEGATIVE
GC Probe RNA: NEGATIVE

## 2014-07-30 NOTE — Progress Notes (Signed)
Patient not seen by physician.  Patient did not show for appointment.  Melinda Ceoharles Damareon Lanni MD.

## 2014-08-02 ENCOUNTER — Encounter (HOSPITAL_COMMUNITY): Payer: Self-pay | Admitting: *Deleted

## 2014-08-10 ENCOUNTER — Inpatient Hospital Stay (HOSPITAL_COMMUNITY): Payer: Medicaid Other

## 2014-08-10 ENCOUNTER — Encounter (HOSPITAL_COMMUNITY): Payer: Self-pay

## 2014-08-10 ENCOUNTER — Inpatient Hospital Stay (HOSPITAL_COMMUNITY)
Admission: AD | Admit: 2014-08-10 | Discharge: 2014-08-10 | Disposition: A | Discharge status: Home / Self Care | Payer: Medicaid Other | Source: Ambulatory Visit | Attending: Obstetrics & Gynecology | Admitting: Obstetrics & Gynecology

## 2014-08-10 DIAGNOSIS — O209 Hemorrhage in early pregnancy, unspecified: Secondary | ICD-10-CM | POA: Insufficient documentation

## 2014-08-10 DIAGNOSIS — O418X10 Other specified disorders of amniotic fluid and membranes, first trimester, not applicable or unspecified: Secondary | ICD-10-CM

## 2014-08-10 DIAGNOSIS — Z3A01 Less than 8 weeks gestation of pregnancy: Secondary | ICD-10-CM | POA: Diagnosis not present

## 2014-08-10 DIAGNOSIS — R109 Unspecified abdominal pain: Secondary | ICD-10-CM | POA: Diagnosis present

## 2014-08-10 DIAGNOSIS — O219 Vomiting of pregnancy, unspecified: Secondary | ICD-10-CM

## 2014-08-10 DIAGNOSIS — O208 Other hemorrhage in early pregnancy: Secondary | ICD-10-CM

## 2014-08-10 DIAGNOSIS — Z87891 Personal history of nicotine dependence: Secondary | ICD-10-CM | POA: Insufficient documentation

## 2014-08-10 DIAGNOSIS — Z3491 Encounter for supervision of normal pregnancy, unspecified, first trimester: Secondary | ICD-10-CM

## 2014-08-10 DIAGNOSIS — O468X1 Other antepartum hemorrhage, first trimester: Secondary | ICD-10-CM

## 2014-08-10 HISTORY — DX: Gastro-esophageal reflux disease without esophagitis: K21.9

## 2014-08-10 HISTORY — DX: Irritable bowel syndrome, unspecified: K58.9

## 2014-08-10 HISTORY — DX: Carpal tunnel syndrome, unspecified upper limb: G56.00

## 2014-08-10 LAB — URINALYSIS, ROUTINE W REFLEX MICROSCOPIC
Bilirubin Urine: NEGATIVE
Glucose, UA: NEGATIVE mg/dL
Hgb urine dipstick: NEGATIVE
KETONES UR: NEGATIVE mg/dL
LEUKOCYTES UA: NEGATIVE
Nitrite: NEGATIVE
PROTEIN: NEGATIVE mg/dL
Specific Gravity, Urine: 1.02 (ref 1.005–1.030)
UROBILINOGEN UA: 1 mg/dL (ref 0.0–1.0)
pH: 7.5 (ref 5.0–8.0)

## 2014-08-10 LAB — CBC
HEMATOCRIT: 34.5 % — AB (ref 36.0–46.0)
Hemoglobin: 11 g/dL — ABNORMAL LOW (ref 12.0–15.0)
MCH: 23.4 pg — ABNORMAL LOW (ref 26.0–34.0)
MCHC: 31.9 g/dL (ref 30.0–36.0)
MCV: 73.4 fL — ABNORMAL LOW (ref 78.0–100.0)
Platelets: 295 10*3/uL (ref 150–400)
RBC: 4.7 MIL/uL (ref 3.87–5.11)
RDW: 18.2 % — AB (ref 11.5–15.5)
WBC: 12.7 10*3/uL — ABNORMAL HIGH (ref 4.0–10.5)

## 2014-08-10 LAB — HCG, QUANTITATIVE, PREGNANCY: hCG, Beta Chain, Quant, S: 20384 m[IU]/mL — ABNORMAL HIGH (ref <5)

## 2014-08-10 MED ORDER — PROMETHAZINE HCL 25 MG PO TABS
25.0000 mg | ORAL_TABLET | ORAL | Status: AC
  Administered 2014-08-10: 25 mg via ORAL
  Filled 2014-08-10: qty 1

## 2014-08-10 MED ORDER — PROMETHAZINE HCL 25 MG PO TABS: 12.5000 mg | ORAL_TABLET | Freq: Four times a day (QID) | ORAL | Status: DC | PRN

## 2014-08-10 NOTE — MAU Provider Note (Signed)
Chief Complaint: Abdominal Pain   First Provider Initiated Contact with Patient 08/10/14 1242     SUBJECTIVE HPI: Melinda Jimenez is a 28 y.o. [id number] at [redacted]w[redacted]d by LMP who presents to maternity admissions reporting abdominal cramping and vaginal bleeding in pregnancy.  She was seen 10/26 for similar symptoms and was supposed to come back to MAU on 10/28 for repeat quant hcg but did not come in.  She reports bleeding increased and was bright red but light x2 days and is now scant and brown.  Her abdominal pain is mild, described as occasional sharp pain on both right and left lower sides of abdomen.  She denies vaginal itching/burning, urinary symptoms, h/a, dizziness, n/v, or fever/chills.     Past Medical History  Diagnosis Date  . Genital HSV     Last OB: > 34yr ago; Valtrex 500 mg po prn outbreaks  . Thyroid disease   . Mood swings   . Headache(784.0)   . Chlamydia   . Trichomonas   . Anemia   . Gestational diabetes     diet controlled  . Infection     UTI  . Anxiety     off meds with preg  . Panic attack   . Carpal tunnel syndrome   . GERD (gastroesophageal reflux disease)   . IBS (irritable bowel syndrome)    Past Surgical History  Procedure Laterality Date  . Induced abortion  2009  . Vaginal delivery  2007  . Cesarean section N/A 05/20/2013    Procedure: CESAREAN SECTION;  Surgeon: Brock Bad, MD;  Location: [facility];  Service: Obstetrics;  Laterality: N/A;   History   Social History  . Marital Status: Single    Spouse Name: N/A    Number of Children: 1  . Years of Education: N/A   Occupational History  . Unemployed    Social History Main Topics  . Smoking status: Former Games developer  . Smokeless tobacco: Never Used     Comment: was not a daily smoker, tried it  . Alcohol Use: Yes     Comment: occas; not with preg  . Drug Use: No  . Sexual Activity: Yes    Birth Control/ Protection: None   Other Topics Concern  . Not on file   Social History Narrative   No  current facility-administered medications on file prior to encounter.   Current Outpatient Prescriptions on File Prior to Encounter  Medication Sig Dispense Refill  . albuterol (PROVENTIL HFA;VENTOLIN HFA) 108 (90 BASE) MCG/ACT inhaler Inhale 1-2 puffs into the lungs every 6 (six) hours as needed for wheezing or shortness of breath. 1 Inhaler 0  . cyclobenzaprine (FLEXERIL) 10 MG tablet Take 1 tablet (10 mg total) by mouth 3 (three) times daily as needed for muscle spasms. (Patient not taking: Reported on 08/10/2014) 20 tablet 0  . valACYclovir (VALTREX) 1000 MG tablet Take 1,000 mg by mouth daily.    . valACYclovir (VALTREX) 500 MG tablet Take 1 tablet (500 mg total) by mouth daily. May use bid for 3 days for outbreak. 40 tablet prn   Allergies  Allergen Reactions  . Zofran [Ondansetron Hcl] Nausea And Vomiting    ROS: Pertinent items in HPI  OBJECTIVE Blood pressure 129/75, pulse 92, temperature 98.4 F (36.9 C), temperature source Oral, resp. rate 16, height 5\' 9"  (1.753 m), weight 110.406 kg (243 lb 6.4 oz), last menstrual period 06/23/2014, SpO2 100 %, unknown if currently breastfeeding. GENERAL: Well-developed, well-nourished female in no acute  distress.  HEENT: Normocephalic HEART: normal rate RESP: normal effort ABDOMEN: Soft, non-tender EXTREMITIES: Nontender, no edema NEURO: Alert and oriented Pelvic exam: Cervix pink, visually closed, without lesion, scant light brown discharge, vaginal walls and external genitalia normal Bimanual exam: Cervix 0/long/high, firm, anterior, neg CMT, uterus nontender, nonenlarged, adnexa without tenderness, enlargement, or mass  LAB RESULTS Results for orders placed or performed during the hospital encounter of 08/10/14 (from the past 24 hour(s))  Urinalysis, Routine w reflex microscopic     Status: None   Collection Time: 08/10/14 12:05 PM  Result Value Ref Range   Color, Urine YELLOW YELLOW   APPearance CLEAR CLEAR   Specific Gravity,  Urine 1.020 1.005 - 1.030   pH 7.5 5.0 - 8.0   Glucose, UA NEGATIVE NEGATIVE mg/dL   Hgb urine dipstick NEGATIVE NEGATIVE   Bilirubin Urine NEGATIVE NEGATIVE   Ketones, ur NEGATIVE NEGATIVE mg/dL   Protein, ur NEGATIVE NEGATIVE mg/dL   Urobilinogen, UA 1.0 0.0 - 1.0 mg/dL   Nitrite NEGATIVE NEGATIVE   Leukocytes, UA NEGATIVE NEGATIVE  hCG, quantitative, pregnancy     Status: Abnormal   Collection Time: 08/10/14 12:24 PM  Result Value Ref Range   hCG, Beta Chain, Quant, S [id number] (H) <5 mIU/mL  CBC     Status: Abnormal   Collection Time: 08/10/14 12:25 PM  Result Value Ref Range   WBC 12.7 (H) 4.0 - 10.5 K/uL   RBC 4.70 3.87 - 5.11 MIL/uL   Hemoglobin 11.0 (L) 12.0 - 15.0 g/dL   HCT 60.434.5 (L) 54.036.0 - 98.146.0 %   MCV 73.4 (L) 78.0 - 100.0 fL   MCH 23.4 (L) 26.0 - 34.0 pg   MCHC 31.9 30.0 - 36.0 g/dL   RDW 19.118.2 (H) 47.811.5 - 29.515.5 %   Platelets 295 150 - 400 K/uL    IMAGING [place] Ob Comp Less 14 Wks  07/26/2014   CLINICAL DATA:  Eight weeks 2 days pregnant with abdominal painAbdominal pain R10.9 (ICD-10-CM).  EXAM: OBSTETRIC <14 WK US AND TRANSVAGINAL OB US  TECHNIQUE: Both transabdominal and transvaginal ultrasound examinations were performed for complete evaluation of the gestation as well as the maternal uterus, adnexal regions, and pelvic cul-de-sac. Transvaginal technique was performed to assess early pregnancy.  COMPARISON:  05/07/2013  FINDINGS: Intrauterine gestational sac: Likely identified.  Yolk sac:  Absent  Embryo:  Absent  Cardiac Activity: Absent  MSD:  4  mm   4 w   6  d  US EDC: 03/29/2015  Maternal uterus/adnexae: No subchorionic hemorrhage. Normal appearance of both ovaries.  Trace free pelvic fluid is likely physiologic.  IMPRESSION: 1. Tiny collection within the endometrium is likely a small gestational sac. Lack of visualization of fetal pole or yolk sac, likely due to early gestational age. Recommend beta HCG and possibly ultrasound followup. 2. No evidence of adnexal mass or  other explanation for pain.   Electronically Signed   By: Jeronimo GreavesKyle  Talbot M.D.   On: 07/26/2014 20:19   [place] Ob Transvaginal  08/10/2014   CLINICAL DATA:  Vaginal bleeding. Pelvic pain. First trimester pregnancy.  EXAM: OBSTETRIC <14 WK US AND TRANSVAGINAL OB US  TECHNIQUE: Both transabdominal and transvaginal ultrasound examinations were performed for complete evaluation of the gestation as well as the maternal uterus, adnexal regions, and pelvic cul-de-sac. Transvaginal technique was performed to assess early pregnancy.  COMPARISON:  07/26/2014  FINDINGS: Intrauterine gestational sac: Visualized/normal in shape.  Yolk sac:  Visualized  Embryo:  Visualized  Cardiac Activity:  Visualized  Heart Rate:  131 bpm  CRL:   6  mm   6 w 4 d                  US EDC: 04/01/2015  Maternal uterus/adnexae: Small subchorionic hemorrhage noted. Left ovary is normal in appearance. Right ovary not directly visualized, however no adnexal mass or free fluid identified.  IMPRESSION: [facility] living [facility] 6 weeks 4 days [facility]  Small subchorionic hemorrhage noted.   Electronically Signed   By: Myles RosenthalJohn  Stahl M.D.   On: 08/10/2014 13:45   [place] Ob Transvaginal  07/26/2014   CLINICAL DATA:  Eight weeks 2 days pregnant with abdominal painAbdominal pain R10.9 (ICD-10-CM).  EXAM: OBSTETRIC <14 WK US AND TRANSVAGINAL OB US  TECHNIQUE: Both transabdominal and transvaginal ultrasound examinations were performed for complete evaluation of the gestation as well as the maternal uterus, adnexal regions, and pelvic cul-de-sac. Transvaginal technique was performed to assess early pregnancy.  COMPARISON:  05/07/2013  FINDINGS: Intrauterine gestational sac: Likely identified.  Yolk sac:  Absent  Embryo:  Absent  Cardiac Activity: Absent  MSD:  4  mm   4 w   6  d  US EDC: 03/29/2015  Maternal uterus/adnexae: No subchorionic hemorrhage. Normal appearance of both ovaries.  Trace free pelvic fluid is likely physiologic.  IMPRESSION: 1.  Tiny collection within the endometrium is likely a small gestational sac. Lack of visualization of fetal pole or yolk sac, likely due to early gestational age. Recommend beta HCG and possibly ultrasound followup. 2. No evidence of adnexal mass or other explanation for pain.   Electronically Signed   By: Jeronimo GreavesKyle  Talbot M.D.   On: 07/26/2014 20:19    ASSESSMENT 1. Vaginal bleeding in pregnancy, first trimester     PLAN Discharge home with bleeding precautions F/U with early prenatal care Phenergan 12.5-25 mg PO Q 6 hours PRN nausea Return to MAU as needed for emergencies    Medication List    ASK your doctor about these medications        albuterol 108 (90 BASE) MCG/ACT inhaler  Commonly known as:  PROVENTIL HFA;VENTOLIN HFA  Inhale 1-2 puffs into the lungs every 6 (six) hours as needed for wheezing or shortness of breath.     cyclobenzaprine 10 MG tablet  Commonly known as:  FLEXERIL  Take 1 tablet (10 mg total) by mouth 3 (three) times daily as needed for muscle spasms.     valACYclovir 500 MG tablet  Commonly known as:  VALTREX  Take 1 tablet (500 mg total) by mouth daily. May use bid for 3 days for outbreak.     valACYclovir 1000 MG tablet  Commonly known as:  VALTREX  Take 1,000 mg by mouth daily.         Sharen CounterLisa Leftwich-Kirby Certified Nurse-Midwife 08/10/2014  1:49 PM

## 2014-08-10 NOTE — MAU Note (Signed)
States bleeding had stopped, but started again last night. Was instructed to return for F/U BHCG on 10/28, but did not come. States she has a migraine.

## 2014-08-10 NOTE — Discharge Instructions (Signed)
First Trimester of Pregnancy The first trimester of pregnancy is from week 1 until the end of week 12 (months 1 through 3). A week after a sperm fertilizes an egg, the egg will implant on the wall of the uterus. This embryo will begin to develop into a baby. Genes from you and your partner are forming the baby. The female genes determine whether the baby is a boy or a girl. At 6-8 weeks, the eyes and face are formed, and the heartbeat can be seen on ultrasound. At the end of 12 weeks, all the baby's organs are formed.  Now that you are pregnant, you will want to do everything you can to have a healthy baby. Two of the most important things are to get good prenatal care and to follow your health care provider's instructions. Prenatal care is all the medical care you receive before the baby's birth. This care will help prevent, find, and treat any problems during the pregnancy and childbirth. BODY CHANGES Your body goes through many changes during pregnancy. The changes vary from woman to woman.   You may gain or lose a couple of pounds at first.  You may feel sick to your stomach (nauseous) and throw up (vomit). If the vomiting is uncontrollable, call your health care provider.  You may tire easily.  You may develop headaches that can be relieved by medicines approved by your health care provider.  You may urinate more often. Painful urination may mean you have a bladder infection.  You may develop heartburn as a result of your pregnancy.  You may develop constipation because certain hormones are causing the muscles that push waste through your intestines to slow down.  You may develop hemorrhoids or swollen, bulging veins (varicose veins).  Your breasts may begin to grow larger and become tender. Your nipples may stick out more, and the tissue that surrounds them (areola) may become darker.  Your gums may bleed and may be sensitive to brushing and flossing.  Dark spots or blotches (chloasma,  mask of pregnancy) may develop on your face. This will likely fade after the baby is born.  Your menstrual periods will stop.  You may have a loss of appetite.  You may develop cravings for certain kinds of food.  You may have changes in your emotions from day to day, such as being excited to be pregnant or being concerned that something may go wrong with the pregnancy and baby.  You may have more vivid and strange dreams.  You may have changes in your hair. These can include thickening of your hair, rapid growth, and changes in texture. Some women also have hair loss during or after pregnancy, or hair that feels dry or thin. Your hair will most likely return to normal after your baby is born. WHAT TO EXPECT AT YOUR PRENATAL VISITS During a routine prenatal visit:  You will be weighed to make sure you and the baby are growing normally.  Your blood pressure will be taken.  Your abdomen will be measured to track your baby's growth.  The fetal heartbeat will be listened to starting around week 10 or 12 of your pregnancy.  Test results from any previous visits will be discussed. Your health care provider may ask you:  How you are feeling.  If you are feeling the baby move.  If you have had any abnormal symptoms, such as leaking fluid, bleeding, severe headaches, or abdominal cramping.  If you have any questions. Other tests   that may be performed during your first trimester include:  Blood tests to find your blood type and to check for the presence of any previous infections. They will also be used to check for low iron levels (anemia) and Rh antibodies. Later in the pregnancy, blood tests for diabetes will be done along with other tests if problems develop.  Urine tests to check for infections, diabetes, or protein in the urine.  An ultrasound to confirm the proper growth and development of the baby.  An amniocentesis to check for possible genetic problems.  Fetal screens for  spina bifida and Down syndrome.  You may need other tests to make sure you and the baby are doing well. HOME CARE INSTRUCTIONS  Medicines  Follow your health care provider's instructions regarding medicine use. Specific medicines may be either safe or unsafe to take during pregnancy.  Take your prenatal vitamins as directed.  If you develop constipation, try taking a stool softener if your health care provider approves. Diet  Eat regular, well-balanced meals. Choose a variety of foods, such as meat or vegetable-based protein, fish, milk and low-fat dairy products, vegetables, fruits, and whole grain breads and cereals. Your health care provider will help you determine the amount of weight gain that is right for you.  Avoid raw meat and uncooked cheese. These carry germs that can cause birth defects in the baby.  Eating four or five small meals rather than three large meals a day may help relieve nausea and vomiting. If you start to feel nauseous, eating a few soda crackers can be helpful. Drinking liquids between meals instead of during meals also seems to help nausea and vomiting.  If you develop constipation, eat more high-fiber foods, such as fresh vegetables or fruit and whole grains. Drink enough fluids to keep your urine clear or pale yellow. Activity and Exercise  Exercise only as directed by your health care provider. Exercising will help you:  Control your weight.  Stay in shape.  Be prepared for labor and delivery.  Experiencing pain or cramping in the lower abdomen or low back is a good sign that you should stop exercising. Check with your health care provider before continuing normal exercises.  Try to avoid standing for long periods of time. Move your legs often if you must stand in one place for a long time.  Avoid heavy lifting.  Wear low-heeled shoes, and practice good posture.  You may continue to have sex unless your health care provider directs you  otherwise. Relief of Pain or Discomfort  Wear a good support bra for breast tenderness.   Take warm sitz baths to soothe any pain or discomfort caused by hemorrhoids. Use hemorrhoid cream if your health care provider approves.   Rest with your legs elevated if you have leg cramps or low back pain.  If you develop varicose veins in your legs, wear support hose. Elevate your feet for 15 minutes, 3-4 times a day. Limit salt in your diet. Prenatal Care  Schedule your prenatal visits by the twelfth week of pregnancy. They are usually scheduled monthly at first, then more often in the last 2 months before delivery.  Write down your questions. Take them to your prenatal visits.  Keep all your prenatal visits as directed by your health care provider. Safety  Wear your seat belt at all times when driving.  Make a list of emergency phone numbers, including numbers for family, friends, the hospital, and police and fire departments. General Tips    Ask your health care provider for a referral to a local prenatal education class. Begin classes no later than at the beginning of month 6 of your pregnancy.  Ask for help if you have counseling or nutritional needs during pregnancy. Your health care provider can offer advice or refer you to specialists for help with various needs.  Do not use hot tubs, steam rooms, or saunas.  Do not douche or use tampons or scented sanitary pads.  Do not cross your legs for long periods of time.  Avoid cat litter boxes and soil used by cats. These carry germs that can cause birth defects in the baby and possibly loss of the fetus by miscarriage or stillbirth.  Avoid all smoking, herbs, alcohol, and medicines not prescribed by your health care provider. Chemicals in these affect the formation and growth of the baby.  Schedule a dentist appointment. At home, brush your teeth with a soft toothbrush and be gentle when you floss. SEEK MEDICAL CARE IF:   You have  dizziness.  You have mild pelvic cramps, pelvic pressure, or nagging pain in the abdominal area.  You have persistent nausea, vomiting, or diarrhea.  You have a bad smelling vaginal discharge.  You have pain with urination.  You notice increased swelling in your face, hands, legs, or ankles. SEEK IMMEDIATE MEDICAL CARE IF:   You have a fever.  You are leaking fluid from your vagina.  You have spotting or bleeding from your vagina.  You have severe abdominal cramping or pain.  You have rapid weight gain or loss.  You vomit blood or material that looks like coffee grounds.  You are exposed to German measles and have never had them.  You are exposed to fifth disease or chickenpox.  You develop a severe headache.  You have shortness of breath.  You have any kind of trauma, such as from a fall or a car accident. Document Released: 09/11/2001 Document Revised: 02/01/2014 Document Reviewed: 07/28/2013 [employer] Patient Information 2015 [employer]. This information is not intended to replace advice given to you by your health care provider. Make sure you discuss any questions you have with your health care provider.  

## 2014-08-10 NOTE — MAU Note (Signed)
Patient states she had light spotting last night but not today. Has had pain up the right side from the thigh to the back.

## 2014-08-31 ENCOUNTER — Telehealth: Payer: Self-pay | Admitting: *Deleted

## 2014-08-31 NOTE — Telephone Encounter (Signed)
Patient called [redacted] weeks pregnant with bleeding. 11:20 Call to patient- patient states she had bleeding after intercourse. Bleeding was dark red with no tissue or clots noticed. Bleeding stopped after 2 hours and has not returned. Patient states she had spotting after intercourse previous. Patient advised that intercourse can cause bleeding in early pregnancy- US did show small subchorionic hemorrhage and that could be the cause of bleeding. Patient has appointment for NOB on 09/07/2014- patient advised to abstain from intercourse until that appointment monitor for any other bleeding and call with occurrence not related to intercourse. Advisement discussed with provider and agreed upon

## 2014-09-07 ENCOUNTER — Encounter: Payer: Medicaid Other | Admitting: Obstetrics

## 2014-09-27 ENCOUNTER — Encounter: Payer: Self-pay | Admitting: *Deleted

## 2014-09-28 ENCOUNTER — Encounter: Payer: Self-pay | Admitting: Obstetrics

## 2014-10-04 ENCOUNTER — Ambulatory Visit: Payer: Self-pay | Admitting: Obstetrics

## 2014-10-15 ENCOUNTER — Encounter (HOSPITAL_COMMUNITY): Payer: Self-pay

## 2014-10-15 ENCOUNTER — Inpatient Hospital Stay (HOSPITAL_COMMUNITY): Payer: Medicaid Other

## 2014-10-15 ENCOUNTER — Inpatient Hospital Stay (HOSPITAL_COMMUNITY)
Admission: AD | Admit: 2014-10-15 | Discharge: 2014-10-15 | Disposition: A | Discharge status: Home / Self Care | Payer: Medicaid Other | Source: Ambulatory Visit | Attending: Obstetrics | Admitting: Obstetrics

## 2014-10-15 DIAGNOSIS — O074 Failed attempted termination of pregnancy without complication: Secondary | ICD-10-CM

## 2014-10-15 DIAGNOSIS — R109 Unspecified abdominal pain: Secondary | ICD-10-CM | POA: Diagnosis present

## 2014-10-15 DIAGNOSIS — N939 Abnormal uterine and vaginal bleeding, unspecified: Secondary | ICD-10-CM | POA: Diagnosis present

## 2014-10-15 DIAGNOSIS — Z87891 Personal history of nicotine dependence: Secondary | ICD-10-CM | POA: Insufficient documentation

## 2014-10-15 DIAGNOSIS — O034 Incomplete spontaneous abortion without complication: Secondary | ICD-10-CM | POA: Diagnosis not present

## 2014-10-15 DIAGNOSIS — Z3A11 11 weeks gestation of pregnancy: Secondary | ICD-10-CM | POA: Diagnosis not present

## 2014-10-15 DIAGNOSIS — IMO0001 Reserved for inherently not codable concepts without codable children: Secondary | ICD-10-CM

## 2014-10-15 LAB — CBC WITH DIFFERENTIAL/PLATELET
BASOS ABS: 0 10*3/uL (ref 0.0–0.1)
Basophils Relative: 0 % (ref 0–1)
EOS ABS: 0.2 10*3/uL (ref 0.0–0.7)
Eosinophils Relative: 2 % (ref 0–5)
HCT: 34.5 % — ABNORMAL LOW (ref 36.0–46.0)
Hemoglobin: 10.9 g/dL — ABNORMAL LOW (ref 12.0–15.0)
LYMPHS PCT: 43 % (ref 12–46)
Lymphs Abs: 4.8 10*3/uL — ABNORMAL HIGH (ref 0.7–4.0)
MCH: 23.7 pg — ABNORMAL LOW (ref 26.0–34.0)
MCHC: 31.6 g/dL (ref 30.0–36.0)
MCV: 75.2 fL — ABNORMAL LOW (ref 78.0–100.0)
MONO ABS: 0.5 10*3/uL (ref 0.1–1.0)
MONOS PCT: 5 % (ref 3–12)
NEUTROS ABS: 5.8 10*3/uL (ref 1.7–7.7)
Neutrophils Relative %: 50 % (ref 43–77)
Platelets: 279 10*3/uL (ref 150–400)
RBC: 4.59 MIL/uL (ref 3.87–5.11)
RDW: 16.6 % — ABNORMAL HIGH (ref 11.5–15.5)
WBC: 11.3 10*3/uL — AB (ref 4.0–10.5)

## 2014-10-15 LAB — URINE MICROSCOPIC-ADD ON

## 2014-10-15 LAB — URINALYSIS, ROUTINE W REFLEX MICROSCOPIC
Bilirubin Urine: NEGATIVE
Glucose, UA: NEGATIVE mg/dL
Ketones, ur: NEGATIVE mg/dL
Leukocytes, UA: NEGATIVE
NITRITE: NEGATIVE
PH: 6 (ref 5.0–8.0)
PROTEIN: NEGATIVE mg/dL
Specific Gravity, Urine: 1.025 (ref 1.005–1.030)
Urobilinogen, UA: 0.2 mg/dL (ref 0.0–1.0)

## 2014-10-15 LAB — POCT PREGNANCY, URINE: Preg Test, Ur: NEGATIVE

## 2014-10-15 LAB — WET PREP, GENITAL
Clue Cells Wet Prep HPF POC: NONE SEEN
TRICH WET PREP: NONE SEEN

## 2014-10-15 MED ORDER — KETOROLAC TROMETHAMINE 60 MG/2ML IM SOLN
60.0000 mg | Freq: Once | INTRAMUSCULAR | Status: AC
  Administered 2014-10-15: 60 mg via INTRAMUSCULAR
  Filled 2014-10-15: qty 2

## 2014-10-15 MED ORDER — FLUCONAZOLE 150 MG PO TABS: 150.0000 mg | ORAL_TABLET | Freq: Every day | ORAL | Status: DC

## 2014-10-15 MED ORDER — HYDROCODONE-ACETAMINOPHEN 5-325 MG PO TABS: 1.0000 | ORAL_TABLET | Freq: Four times a day (QID) | ORAL | Status: DC | PRN

## 2014-10-15 MED ORDER — MISOPROSTOL 200 MCG PO TABS: 800.0000 ug | ORAL_TABLET | Freq: Once | ORAL | Status: DC

## 2014-10-15 MED ORDER — OXYCODONE-ACETAMINOPHEN 5-325 MG PO TABS: 1.0000 | ORAL_TABLET | Freq: Four times a day (QID) | ORAL | Status: DC | PRN

## 2014-10-15 NOTE — MAU Provider Note (Signed)
History     CSN: [id number]  Arrival date and time: 10/15/14 1809   None     Chief Complaint  Patient presents with  . Vaginal Bleeding  . Back Pain  . Abdominal Pain   HPI Melinda Jimenez is 29 y.o. [id number] presents with heavy vaginal bleeding and abdominal and back pain.  She is a patient of Dr. Verdell Carmine.  She had termination 09/15/14 at reported [redacted] weeks gestation. She bled 1 week, stopped X 1- 2 days and returned.  Off and on pattern since.  Since yesterday, heavy bleeding with clots, tissue.  Hx of miscarriage, states it looks and feels like that.  Intermittent cramping 10/10.  She denies fever and chills.  She tried Ibuprofen 800mg  without relief.  Had intercourse for the first time since procedure 2 days (condoms).  Used K-Y jelly for lubrication and she washed with [place] and now is having itching.  She is upset because she states her son is being evaluated schizophrenia.     Past Medical History  Diagnosis Date  . 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 Thyroid disease   . Mood swings   . Headache(784.0)   . Chlamydia   . Trichomonas   . Anemia   . Gestational diabetes     diet controlled  . Infection     UTI  . Anxiety     off meds with preg  . Panic attack   . Carpal tunnel syndrome   . GERD (gastroesophageal reflux disease)   . IBS (irritable bowel syndrome)     Past Surgical History  Procedure Laterality Date  . Induced abortion  2009  . Vaginal delivery  2007  . Cesarean section N/A 05/20/2013    Procedure: CESAREAN SECTION;  Surgeon: Brock Bad, MD;  Location: [facility];  Service: Obstetrics;  Laterality: N/A;    Family History  Problem Relation Age of Onset  . Breast cancer Other   . Depression Mother   . Anxiety disorder Mother   . Bipolar disorder Brother   . Diabetes Maternal Grandmother   . Cancer Maternal Grandmother   . Hypertension Maternal Grandmother   . Hypertension Paternal Grandmother   .  Autism    . Schizophrenia      History  Substance Use Topics  . Smoking status: Former Games developer  . Smokeless tobacco: Never Used     Comment: was not a daily smoker, tried it  . Alcohol Use: Yes     Comment: occas; not with preg    Allergies:  Allergies  Allergen Reactions  . Zofran [Ondansetron Hcl] Nausea And Vomiting    Prescriptions prior to admission  Medication Sig Dispense Refill Last Dose  . ALPRAZolam (XANAX) 1 MG tablet Take 1 mg by mouth 3 (three) times daily as needed for anxiety.   10/14/2014 at Unknown time  . gabapentin (NEURONTIN) 300 MG capsule Take 300 mg by mouth 2 (two) times daily.   10/14/2014 at Unknown time  . ibuprofen (ADVIL,MOTRIN) 200 MG tablet Take 200 mg by mouth every 6 (six) hours as needed.   10/14/2014 at Unknown time  . albuterol (PROVENTIL HFA;VENTOLIN HFA) 108 (90 BASE) MCG/ACT inhaler Inhale 1-2 puffs into the lungs every 6 (six) hours as needed for wheezing or shortness of breath. 1 Inhaler 0   . cyclobenzaprine (FLEXERIL) 10 MG tablet Take 1 tablet (10 mg total) by mouth 3 (three) times  daily as needed for muscle spasms. (Patient not taking: Reported on 10/15/2014) 20 tablet 0   . promethazine (PHENERGAN) 25 MG tablet Take 0.5 tablets (12.5 mg total) by mouth every 6 (six) hours as needed for nausea or vomiting. (Patient not taking: Reported on 10/15/2014) 30 tablet 2 prn  . valACYclovir (VALTREX) 500 MG tablet Take 1 tablet (500 mg total) by mouth daily. May use bid for 3 days for outbreak. 40 tablet prn 04/05/2014 at Unknown time    Review of Systems  Constitutional: Negative for fever and chills.  Respiratory: Negative for shortness of breath.   Cardiovascular: Negative for chest pain.  Gastrointestinal: Positive for abdominal pain. Negative for nausea and vomiting.  Genitourinary: Negative for dysuria, urgency and frequency.       + for heavy vaginal bleeding and clots  Neurological: Negative for headaches.   Physical Exam   Blood pressure  133/80, pulse 91, temperature 98.6 F (37 C), resp. rate 18, height 5\' 7"  (1.702 m), weight 244 lb 12.8 oz (111.041 kg), last menstrual period 06/23/2014, unknown if currently breastfeeding.  Physical Exam  Constitutional: She is oriented to person, place, and time. She appears well-developed and well-nourished. No distress.  HENT:  Head: Normocephalic.  Neck: Normal range of motion.  Cardiovascular: Normal rate.   Respiratory: Effort normal.  GI: Soft. She exhibits no distension and no mass. There is tenderness (moderate tenderness lower abdomen.  ). There is no rebound and no guarding.  Genitourinary: There is no rash, tenderness or lesion on the right labia. There is no rash, tenderness or lesion on the left labia. Uterus is enlarged and tender (moderate tenderness on exam). Cervix exhibits no motion tenderness, no discharge and no friability. Right adnexum displays no mass, no tenderness and no fullness. Left adnexum displays no mass, no tenderness and no fullness. There is bleeding (small amount of blood /clot in the cervical os.) in the vagina. No vaginal discharge found.  Neurological: She is alert and oriented to person, place, and time.  Skin: Skin is warm and dry.  Psychiatric: She has a normal mood and affect. Her behavior is normal.   Results for orders placed or performed during the hospital encounter of 10/15/14 (from the past 24 hour(s))  Urinalysis, Routine w reflex microscopic     Status: Abnormal   Collection Time: 10/15/14  7:07 PM  Result Value Ref Range   Color, Urine YELLOW YELLOW   APPearance CLEAR CLEAR   Specific Gravity, Urine 1.025 1.005 - 1.030   pH 6.0 5.0 - 8.0   Glucose, UA NEGATIVE NEGATIVE mg/dL   Hgb urine dipstick MODERATE (A) NEGATIVE   Bilirubin Urine NEGATIVE NEGATIVE   Ketones, ur NEGATIVE NEGATIVE mg/dL   Protein, ur NEGATIVE NEGATIVE mg/dL   Urobilinogen, UA 0.2 0.0 - 1.0 mg/dL   Nitrite NEGATIVE NEGATIVE   Leukocytes, UA NEGATIVE NEGATIVE   Urine microscopic-add on     Status: None   Collection Time: 10/15/14  7:07 PM  Result Value Ref Range   Squamous Epithelial / LPF RARE RARE   WBC, UA 0-2 <3 WBC/hpf   RBC / HPF 0-2 <3 RBC/hpf   Bacteria, UA RARE RARE  Pregnancy, urine POC     Status: None   Collection Time: 10/15/14  7:09 PM  Result Value Ref Range   Preg Test, Ur NEGATIVE NEGATIVE  Wet prep, genital     Status: Abnormal   Collection Time: 10/15/14  7:10 PM  Result Value Ref Range  Yeast Wet Prep HPF POC FEW (A) NONE SEEN   Trich, Wet Prep NONE SEEN NONE SEEN   Clue Cells Wet Prep HPF POC NONE SEEN NONE SEEN   WBC, Wet Prep HPF POC FEW (A) NONE SEEN  CBC with Differential     Status: Abnormal   Collection Time: 10/15/14  7:15 PM  Result Value Ref Range   WBC 11.3 (H) 4.0 - 10.5 K/uL   RBC 4.59 3.87 - 5.11 MIL/uL   Hemoglobin 10.9 (L) 12.0 - 15.0 g/dL   HCT 45.4 (L) 09.8 - 11.9 %   MCV 75.2 (L) 78.0 - 100.0 fL   MCH 23.7 (L) 26.0 - 34.0 pg   MCHC 31.6 30.0 - 36.0 g/dL   RDW 14.7 (H) 82.9 - 56.2 %   Platelets 279 150 - 400 K/uL   Neutrophils Relative % 50 43 - 77 %   Neutro Abs 5.8 1.7 - 7.7 K/uL   Lymphocytes Relative 43 12 - 46 %   Lymphs Abs 4.8 (H) 0.7 - 4.0 K/uL   Monocytes Relative 5 3 - 12 %   Monocytes Absolute 0.5 0.1 - 1.0 K/uL   Eosinophils Relative 2 0 - 5 %   Eosinophils Absolute 0.2 0.0 - 0.7 K/uL   Basophils Relative 0 0 - 1 %   Basophils Absolute 0.0 0.0 - 0.1 K/uL    BLOOD TYPE FROM PREVIOUS RECORD--A Positive  MAU Course  Procedures   Toradol  IM given in MAU                       GC/CHL culture pending.  MDM  20:00 Care turned over to [place], [place].  US Transvaginal Non-ob  10/15/2014   CLINICAL DATA:  29 year old G9 P3 AB6, therapeutic abortion on 09/15/2014, presenting with heavy vaginal bleeding, lower abdominal pain and lower back pain.  EXAM: TRANSABDOMINAL ULTRASOUND OF PELVIS  TECHNIQUE: Transabdominal ultrasound examination of the pelvis was performed including  evaluation of the uterus, ovaries, adnexal regions, and pelvic cul-de-sac.  COMPARISON:  None.  FINDINGS: Uterus  Measurements: Approximately 8.8 x 5.8 x 5.7 cm. Homogeneous echotexture without focal fibroid or other myometrial abnormality. Normal-appearing uterine cervix.  Endometrium  Thickness: Approximately 28 mm. Heterogeneous endometrial contents with evidence of power Doppler flow in the endometrial contents.  Right ovary  Measurements: Approximately 3.7 x 2.2 x 2.3 cm. Small follicular cysts. No dominant cyst or solid mass. Normal color Doppler flow within the ovary.  Left ovary  Measurements: Approximately 3.9 x 2.9 x 1.9 cm. Small follicular cysts. No dominant cyst or solid mass. Normal color Doppler flow within the ovary.  Other findings:  No free pelvic fluid.  IMPRESSION: 1. Thickened, heterogeneous endometrial contents with blood flow, indicating retained products of conception. 2. Normal-appearing ovaries.  No free pelvic fluid.   Electronically Signed   By: Hulan Saas M.D.   On: 10/15/2014 20:03   Results for orders placed or performed during the hospital encounter of 10/15/14 (from the past 24 hour(s))  Urinalysis, Routine w reflex microscopic     Status: Abnormal   Collection Time: 10/15/14  7:07 PM  Result Value Ref Range   Color, Urine YELLOW YELLOW   APPearance CLEAR CLEAR   Specific Gravity, Urine 1.025 1.005 - 1.030   pH 6.0 5.0 - 8.0   Glucose, UA NEGATIVE NEGATIVE mg/dL   Hgb urine dipstick MODERATE (A) NEGATIVE   Bilirubin Urine NEGATIVE NEGATIVE   Ketones, ur NEGATIVE NEGATIVE mg/dL  Protein, ur NEGATIVE NEGATIVE mg/dL   Urobilinogen, UA 0.2 0.0 - 1.0 mg/dL   Nitrite NEGATIVE NEGATIVE   Leukocytes, UA NEGATIVE NEGATIVE  Urine microscopic-add on     Status: None   Collection Time: 10/15/14  7:07 PM  Result Value Ref Range   Squamous Epithelial / LPF RARE RARE   WBC, UA 0-2 <3 WBC/hpf   RBC / HPF 0-2 <3 RBC/hpf   Bacteria, UA RARE RARE  Pregnancy, urine POC      Status: None   Collection Time: 10/15/14  7:09 PM  Result Value Ref Range   Preg Test, Ur NEGATIVE NEGATIVE  Wet prep, genital     Status: Abnormal   Collection Time: 10/15/14  7:10 PM  Result Value Ref Range   Yeast Wet Prep HPF POC FEW (A) NONE SEEN   Trich, Wet Prep NONE SEEN NONE SEEN   Clue Cells Wet Prep HPF POC NONE SEEN NONE SEEN   WBC, Wet Prep HPF POC FEW (A) NONE SEEN  CBC with Differential     Status: Abnormal   Collection Time: 10/15/14  7:15 PM  Result Value Ref Range   WBC 11.3 (H) 4.0 - 10.5 K/uL   RBC 4.59 3.87 - 5.11 MIL/uL   Hemoglobin 10.9 (L) 12.0 - 15.0 g/dL   HCT 16.1 (L) 09.6 - 04.5 %   MCV 75.2 (L) 78.0 - 100.0 fL   MCH 23.7 (L) 26.0 - 34.0 pg   MCHC 31.6 30.0 - 36.0 g/dL   RDW 40.9 (H) 81.1 - 91.4 %   Platelets 279 150 - 400 K/uL   Neutrophils Relative % 50 43 - 77 %   Neutro Abs 5.8 1.7 - 7.7 K/uL   Lymphocytes Relative 43 12 - 46 %   Lymphs Abs 4.8 (H) 0.7 - 4.0 K/uL   Monocytes Relative 5 3 - 12 %   Monocytes Absolute 0.5 0.1 - 1.0 K/uL   Eosinophils Relative 2 0 - 5 %   Eosinophils Absolute 0.2 0.0 - 0.7 K/uL   Basophils Relative 0 0 - 1 %   Basophils Absolute 0.0 0.0 - 0.1 K/uL   Case reviewed with Dr. Gaynell Face.  He advises for administration of cytotec intravaginally, along with pain medication and discharge.  Follow up with Dr. Clearance Coots on Monday.   Assessment and Plan  A:  Retained POC post termination       Vaginal bleeding      Abdominal Pain      Yeast      Negative Pregnancy Test  P: Discharge to home Follow up with Dr. Clearance Coots on Monday Diflucan  po x 1 Early Intrauterine Pregnancy Failure Protocol X  Retained products following termination X  No serious current illness  X  Baseline Hgb greater than or equal to 10g/dl  X  Patient has easily accessible transportation to the hospital  X  Clear preference  X  Practitioner/physician deems patient reliable  X  Counseling by practitioner or physician  X  Patient education  by RN     onsent form signed       Rho-Gam given by RN if indicated  X  Medication dispensed  X  Cytotec 800 mcg Intravaginally by patient at home       Intravaginally by NP in MAU       Rectally by patient at home       Rectally by RN in MAU  X   Ibuprofen 600 mg 1 tablet by  mouth every 6 hours as needed #30 - prescribed  X   Hydrococodone/acetaminophen by mouth every 6 hours as needed - prescribed    Reviewed with pt cytotec procedure.  Pt verbalizes that she lives close to the hospital and has transportation readily available.  Pt appears reliable and verbalizes understanding and agrees with plan of care Patient may return to MAU as needed or if her condition were to change or worsen         KEY,EVE M 10/15/2014, 7:53 PM

## 2014-10-15 NOTE — MAU Note (Signed)
Pt presents complaining of heavy vaginal bleeding that started yesterday with clots. Had termination 09/15/14. Pt also having back pain and lower abdominal cramps that she says are coming every 4 minutes. States she is also having vaginal itching. Used a new lubrication 2 days ago and thinks that may be causing the itching

## 2014-10-15 NOTE — Discharge Instructions (Signed)
Abnormal Uterine Bleeding Abnormal uterine bleeding can affect women at various stages in life, including teenagers, women in their reproductive years, pregnant women, and women who have reached menopause. Several kinds of uterine bleeding are considered abnormal, including:  Bleeding or spotting between periods.   Bleeding after sexual intercourse.   Bleeding that is heavier or more than normal.   Periods that last longer than usual.  Bleeding after menopause.  Many cases of abnormal uterine bleeding are minor and simple to treat, while others are more serious. Any type of abnormal bleeding should be evaluated by your health care provider. Treatment will depend on the cause of the bleeding. HOME CARE INSTRUCTIONS Monitor your condition for any changes. The following actions may help to alleviate any discomfort you are experiencing:  Avoid the use of tampons and douches as directed by your health care provider.  Change your pads frequently. You should get regular pelvic exams and Pap tests. Keep all follow-up appointments for diagnostic tests as directed by your health care provider.  SEEK MEDICAL CARE IF:   Your bleeding lasts more than 1 week.   You feel dizzy at times.  SEEK IMMEDIATE MEDICAL CARE IF:   You pass out.   You are changing pads every 15 to 30 minutes.   You have abdominal pain.  You have a fever.   You become sweaty or weak.   You are passing large blood clots from the vagina.   You start to feel nauseous and vomit. MAKE SURE YOU:   Understand these instructions.  Will watch your condition.  Will get help right away if you are not doing well or get worse. Document Released: 09/17/2005 Document Revised: 09/22/2013 Document Reviewed: 04/16/2013 [facility] Patient Information 2015 [place], [place]. This information is not intended to replace advice given to you by your health care provider. Make sure you discuss any questions you have with your  health care provider.  FACTS YOU SHOULD KNOW  Medicine (CYTOTEC)  The complete procedure may take days to weeks  No Surgery  Bleeding may be heavy at times  There may be drug side effects  Patient has more control   CYTOTEC MANAGEMENT Prostaglandins (cytotec) are the most widely used drug for this purpose. They cause the uterus to cramp and contract. You will place the medicine yourself inside your vagina in the privacy of your home. Empting of the uterus should occur within 3 days but the process may continue for several weeks. The bleeding may seem heavy at times. POSSIBLE SIDE EFFECTS FROM CYTOTEC  Nausea   Vomiting  Diarrhea Fever  Chills  Hot Flashes Side effects  from the process of the early pregnancy failure include:  Cramping  Bleeding  Headaches  Dizziness RISKS: This is a low risk procedure. Less than 1 in 100 women has a complication. An incomplete passage of the early pregnancy may occur. Also, Hemorrhage (heavy bleeding) could happen.  Rarely the pregnancy will not be passed completely. Excessively heavy bleeding may occur.  Your doctor may need to perform surgery to empty the uterus (D&E). Afterwards: Everybody will feel differently after the early pregnancy completion. You may have soreness or cramps for a day or two. You may have soreness or cramps for day or two.  You may have light bleeding for up to 2 weeks. You may be as active as you feel like being. If you have any of the following problems you may call [facility] at [phone].  If you have pain  that does not get better  with pain medication  Bleeding that soaks through 2 thick full-sized sanitary pads in an hour  Cramps that last longer than 2 days  Foul smelling discharge  Fever above 100.4 degrees F Even if you do not have any of these symptoms, you should have a follow-up exam to make sure you are healing properly. This appointment will be made for you before you leave the  hospital. Your next normal period will start again in 4-6 week after the loss. You can get pregnant soon after the loss, so use birth control right away. Finally: Make sure all your questions are answered before during and after any procedure. Follow up with medical care and family planning methods.

## 2014-10-18 LAB — GC/CHLAMYDIA PROBE AMP (~~LOC~~) NOT AT ARMC
Chlamydia: NEGATIVE
Neisseria Gonorrhea: NEGATIVE

## 2014-10-25 ENCOUNTER — Telehealth: Payer: Self-pay | Admitting: *Deleted

## 2014-10-25 NOTE — Telephone Encounter (Signed)
Patient called regarding an appointment for birth control.  Attempted to contact the patient and left message for patient to call the office.  * Patient needs birth control consult.

## 2014-10-26 NOTE — Telephone Encounter (Signed)
Patient contacted the office. Patient scheduled for a birth control consult on 10-28-14 @ 2:30 pm.

## 2014-10-28 ENCOUNTER — Ambulatory Visit: Payer: Self-pay | Admitting: Obstetrics

## 2014-12-18 ENCOUNTER — Encounter (HOSPITAL_COMMUNITY): Payer: Self-pay | Admitting: Emergency Medicine

## 2014-12-18 ENCOUNTER — Emergency Department (HOSPITAL_COMMUNITY)
Admission: EM | Admit: 2014-12-18 | Discharge: 2014-12-18 | Disposition: A | Discharge status: Home / Self Care | Payer: Medicaid Other | Attending: Emergency Medicine | Admitting: Emergency Medicine

## 2014-12-18 DIAGNOSIS — Z862 Personal history of diseases of the blood and blood-forming organs and certain disorders involving the immune mechanism: Secondary | ICD-10-CM | POA: Insufficient documentation

## 2014-12-18 DIAGNOSIS — R21 Rash and other nonspecific skin eruption: Secondary | ICD-10-CM | POA: Insufficient documentation

## 2014-12-18 DIAGNOSIS — Z8719 Personal history of other diseases of the digestive system: Secondary | ICD-10-CM | POA: Insufficient documentation

## 2014-12-18 DIAGNOSIS — Z8669 Personal history of other diseases of the nervous system and sense organs: Secondary | ICD-10-CM | POA: Diagnosis not present

## 2014-12-18 DIAGNOSIS — F410 Panic disorder [episodic paroxysmal anxiety] without agoraphobia: Secondary | ICD-10-CM | POA: Diagnosis not present

## 2014-12-18 DIAGNOSIS — Z8632 Personal history of gestational diabetes: Secondary | ICD-10-CM | POA: Diagnosis not present

## 2014-12-18 DIAGNOSIS — L299 Pruritus, unspecified: Secondary | ICD-10-CM | POA: Insufficient documentation

## 2014-12-18 DIAGNOSIS — Z79899 Other long term (current) drug therapy: Secondary | ICD-10-CM | POA: Diagnosis not present

## 2014-12-18 DIAGNOSIS — Z87891 Personal history of nicotine dependence: Secondary | ICD-10-CM | POA: Insufficient documentation

## 2014-12-18 DIAGNOSIS — Z87440 Personal history of urinary (tract) infections: Secondary | ICD-10-CM | POA: Diagnosis not present

## 2014-12-18 DIAGNOSIS — Z8639 Personal history of other endocrine, nutritional and metabolic disease: Secondary | ICD-10-CM | POA: Insufficient documentation

## 2014-12-18 DIAGNOSIS — Z8619 Personal history of other infectious and parasitic diseases: Secondary | ICD-10-CM | POA: Diagnosis not present

## 2014-12-18 MED ORDER — HYDROCORTISONE 1 % EX CREA: TOPICAL_CREAM | CUTANEOUS | Status: DC

## 2014-12-18 MED ORDER — DIPHENHYDRAMINE HCL 25 MG PO TABS: 25.0000 mg | ORAL_TABLET | Freq: Four times a day (QID) | ORAL | Status: DC

## 2014-12-18 NOTE — Discharge Instructions (Signed)
Apply hydrocortisone cream twice daily. Take Benadryl every 6 hours as needed for itching.  Rash A rash is a change in the color or texture of your skin. There are many different types of rashes. You may have other problems that accompany your rash. CAUSES   Infections.  Allergic reactions. This can include allergies to pets or foods.  Certain medicines.  Exposure to certain chemicals, soaps, or cosmetics.  Heat.  Exposure to poisonous plants.  Tumors, both cancerous and noncancerous. SYMPTOMS   Redness.  Scaly skin.  Itchy skin.  Dry or cracked skin.  Bumps.  Blisters.  Pain. DIAGNOSIS  Your caregiver may do a physical exam to determine what type of rash you have. A skin sample (biopsy) may be taken and examined under a microscope. TREATMENT  Treatment depends on the type of rash you have. Your caregiver may prescribe certain medicines. For serious conditions, you may need to see a skin doctor (dermatologist). HOME CARE INSTRUCTIONS   Avoid the substance that caused your rash.  Do not scratch your rash. This can cause infection.  You may take cool baths to help stop itching.  Only take over-the-counter or prescription medicines as directed by your caregiver.  Keep all follow-up appointments as directed by your caregiver. SEEK IMMEDIATE MEDICAL CARE IF:  You have increasing pain, swelling, or redness.  You have a fever.  You have new or severe symptoms.  You have body aches, diarrhea, or vomiting.  Your rash is not better after 3 days. MAKE SURE YOU:  Understand these instructions.  Will watch your condition.  Will get help right away if you are not doing well or get worse. Document Released: 09/07/2002 Document Revised: 12/10/2011 Document Reviewed: 07/02/2011 [facility] Patient Information 2015 [place]. This information is not intended to replace advice given to you by your health care provider. Make sure you discuss any questions you have  with your health care provider.

## 2014-12-18 NOTE — ED Notes (Signed)
Patient here with painful itching from bite on upper extremities. No obvious bites.

## 2014-12-18 NOTE — ED Notes (Signed)
Pt c/o itching on right inner upper arm since yesterday.

## 2014-12-18 NOTE — ED Provider Notes (Signed)
CSN: [id number]     Arrival date & time 12/18/14  2235 History  This chart was scribed for Celene Skeen, PA-C working with Linwood Dibbles, MD by Elveria Rising, ED Scribe. This patient was seen in room TR05C/TR05C and the patient's care was started at 10:54 PM.   Chief Complaint  Patient presents with  . Insect Bite  . Pruritis   The history is provided by the patient. No language interpreter was used.   HPI Comments: Melinda Jimenez is a 29 y.o. female who presents to the Emergency Department complaining of pain and itching from bites that she noticed yesterday morning. Patient complaints of itching, redness and swelling to posterior aspect right arm. Patient reports treatment with an old prescription cream she had at home.  Patient denies introduction of new dermatological products. Patient denies contacts at home with similar symptoms.   Past Medical History  Diagnosis Date  . 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 Thyroid disease   . Mood swings   . Headache(784.0)   . Chlamydia   . Trichomonas   . Anemia   . Gestational diabetes     diet controlled  . Infection     UTI  . Anxiety     off meds with preg  . Panic attack   . Carpal tunnel syndrome   . GERD (gastroesophageal reflux disease)   . IBS (irritable bowel syndrome)    Past Surgical History  Procedure Laterality Date  . Induced abortion  2009  . Vaginal delivery  2007  . Cesarean section N/A 05/20/2013    Procedure: CESAREAN SECTION;  Surgeon: Brock Bad, MD;  Location: [facility];  Service: Obstetrics;  Laterality: N/A;   Family History  Problem Relation Age of Onset  . Breast cancer Other   . Depression Mother   . Anxiety disorder Mother   . Bipolar disorder Brother   . Diabetes Maternal Grandmother   . Cancer Maternal Grandmother   . Hypertension Maternal Grandmother   . Hypertension Paternal Grandmother   . Autism    . Schizophrenia     History  Substance Use Topics  .  Smoking status: Former Games developer  . Smokeless tobacco: Never Used     Comment: was not a daily smoker, tried it  . Alcohol Use: Yes     Comment: occas; not with preg   OB History    Gravida Para Term Preterm AB TAB SAB Ectopic Multiple Living   Review of Systems  Constitutional: Negative for fever and chills.  HENT: Negative for facial swelling and trouble swallowing.   Respiratory: Negative for chest tightness, shortness of breath and wheezing.   Skin: Positive for rash.   Allergies  Zofran  Home Medications   Prior to Admission medications   Medication Sig Start Date End Date Taking? Authorizing Provider  albuterol (PROVENTIL HFA;VENTOLIN HFA) 108 (90 BASE) MCG/ACT inhaler Inhale 1-2 puffs into the lungs every 6 (six) hours as needed for wheezing or shortness of breath. 07/12/14   Elpidio Anis, PA-C  ALPRAZolam Prudy Feeler) 1 MG tablet Take 1 mg by mouth 3 (three) times daily as needed for anxiety.    Historical Provider, MD  diphenhydrAMINE (BENADRYL) 25 MG tablet Take 1 tablet (25 mg total) by mouth every 6 (six) hours. 12/18/14   Anae Hams M Drury Ardizzone, PA-C  fluconazole (DIFLUCAN) 150  MG tablet Take 1 tablet (150 mg total) by mouth daily. 10/15/14   Bertram DenverKaren E Teague Clark, PA-C  gabapentin (NEURONTIN) 300 MG capsule Take 300 mg by mouth 2 (two) times daily.    Historical Provider, MD  HYDROcodone-acetaminophen (NORCO/VICODIN) 5-325 MG per tablet Take 1-2 tablets by mouth every 6 (six) hours as needed for moderate pain. 10/15/14   Bertram DenverKaren E Teague Clark, PA-C  hydrocortisone cream 1 % Apply to affected area 2 times daily 12/18/14   Nada Boozerobyn M Aksel Bencomo, PA-C  ibuprofen (ADVIL,MOTRIN) 200 MG tablet Take 200 mg by mouth every 6 (six) hours as needed.    Historical Provider, MD  misoprostol (CYTOTEC) 200 MCG tablet Place 4 tablets (800 mcg total) vaginally once. Insert all 4 tablets into the vagina at once. 10/15/14   Bertram DenverKaren E Teague Clark, PA-C  promethazine (PHENERGAN) 25 MG tablet Take 0.5  tablets (12.5 mg total) by mouth every 6 (six) hours as needed for nausea or vomiting. Patient not taking: Reported on 10/15/2014 08/10/14   Wilmer FloorLisa A Leftwich-Kirby, CNM  valACYclovir (VALTREX) 500 MG tablet Take 1 tablet (500 mg total) by mouth daily. May use bid for 3 days for outbreak. 04/01/14   Antionette CharLisa Jackson-Moore, MD   Triage Vitals: BP 124/81 mmHg  Pulse 106  Temp(Src) 97.7 F (36.5 C) (Oral)  Resp 24  Ht 5\' 7"  (1.702 m)  Wt 248 lb (112.492 kg)  BMI 38.83 kg/m2  SpO2 98%  LMP 11/27/2014 (Exact Date) Physical Exam  Constitutional: She is oriented to person, place, and time. She appears well-developed and well-nourished. No distress.  HENT:  Head: Normocephalic and atraumatic.  Mouth/Throat: Oropharynx is clear and moist.  Eyes: Conjunctivae and EOM are normal.  Neck: Normal range of motion. Neck supple.  Cardiovascular: Normal rate, regular rhythm and normal heart sounds.   Pulmonary/Chest: Effort normal and breath sounds normal. No respiratory distress.  Musculoskeletal: Normal range of motion. She exhibits no edema.  Neurological: She is alert and oriented to person, place, and time. No sensory deficit.  Skin: Skin is warm and dry.  Small area of erythematous maculopapular rash on right upper arm. No secondary infection. Spares palms. No mucosal lesions.  Psychiatric: She has a normal mood and affect. Her behavior is normal.  Nursing note and vitals reviewed.   ED Course  Procedures (including critical care time)  COORDINATION OF CARE: 10:58 PM- Discussed treatment plan with patient at bedside and patient agreed to plan.   Labs Review Labs Reviewed - No data to display  Imaging Review No results found.   EKG Interpretation None      MDM   Final diagnoses:  Rash   NAD. No tachycardia on my exam. No secondary infection. Rash has appearance of possible bug bites, however does not have appearance of scabies. Treatment with topical hydrocortisone cream, Benadryl for  itching. Stable for discharge. Return precautions given. Patient states understanding of treatment care plan and is agreeable.  I personally performed the services described in this documentation, which was scribed in my presence. The recorded information has been reviewed and is accurate.   Kathrynn SpeedRobyn M Rim Thatch, PA-C 12/18/14 [id number]  Linwood DibblesJon Knapp, MD 12/19/14 1245

## 2015-01-16 ENCOUNTER — Encounter (HOSPITAL_COMMUNITY): Payer: Self-pay | Admitting: General Practice

## 2015-01-16 ENCOUNTER — Emergency Department (HOSPITAL_COMMUNITY): Payer: Medicaid Other

## 2015-01-16 ENCOUNTER — Emergency Department (HOSPITAL_COMMUNITY)
Admission: EM | Admit: 2015-01-16 | Discharge: 2015-01-16 | Disposition: A | Discharge status: Home / Self Care | Payer: Medicaid Other | Attending: Emergency Medicine | Admitting: Emergency Medicine

## 2015-01-16 DIAGNOSIS — R079 Chest pain, unspecified: Secondary | ICD-10-CM | POA: Insufficient documentation

## 2015-01-16 DIAGNOSIS — Z862 Personal history of diseases of the blood and blood-forming organs and certain disorders involving the immune mechanism: Secondary | ICD-10-CM | POA: Insufficient documentation

## 2015-01-16 DIAGNOSIS — Z8639 Personal history of other endocrine, nutritional and metabolic disease: Secondary | ICD-10-CM | POA: Insufficient documentation

## 2015-01-16 DIAGNOSIS — Z7952 Long term (current) use of systemic steroids: Secondary | ICD-10-CM | POA: Insufficient documentation

## 2015-01-16 DIAGNOSIS — Z8719 Personal history of other diseases of the digestive system: Secondary | ICD-10-CM | POA: Insufficient documentation

## 2015-01-16 DIAGNOSIS — Z79899 Other long term (current) drug therapy: Secondary | ICD-10-CM | POA: Diagnosis not present

## 2015-01-16 DIAGNOSIS — Z8739 Personal history of other diseases of the musculoskeletal system and connective tissue: Secondary | ICD-10-CM | POA: Insufficient documentation

## 2015-01-16 DIAGNOSIS — Z87891 Personal history of nicotine dependence: Secondary | ICD-10-CM | POA: Diagnosis not present

## 2015-01-16 DIAGNOSIS — E663 Overweight: Secondary | ICD-10-CM | POA: Diagnosis not present

## 2015-01-16 DIAGNOSIS — Z8632 Personal history of gestational diabetes: Secondary | ICD-10-CM | POA: Insufficient documentation

## 2015-01-16 DIAGNOSIS — Z3202 Encounter for pregnancy test, result negative: Secondary | ICD-10-CM | POA: Insufficient documentation

## 2015-01-16 DIAGNOSIS — Z8619 Personal history of other infectious and parasitic diseases: Secondary | ICD-10-CM | POA: Diagnosis not present

## 2015-01-16 DIAGNOSIS — F419 Anxiety disorder, unspecified: Secondary | ICD-10-CM | POA: Insufficient documentation

## 2015-01-16 DIAGNOSIS — Z87440 Personal history of urinary (tract) infections: Secondary | ICD-10-CM | POA: Diagnosis not present

## 2015-01-16 LAB — POC URINE PREG, ED: Preg Test, Ur: NEGATIVE

## 2015-01-16 MED ORDER — ASPIRIN 81 MG PO CHEW
324.0000 mg | CHEWABLE_TABLET | Freq: Once | ORAL | Status: AC
  Administered 2015-01-16: 324 mg via ORAL
  Filled 2015-01-16: qty 4

## 2015-01-16 MED ORDER — NAPROXEN 500 MG PO TABS: 500.0000 mg | ORAL_TABLET | Freq: Two times a day (BID) | ORAL | Status: DC

## 2015-01-16 NOTE — Discharge Instructions (Signed)

## 2015-01-16 NOTE — ED Provider Notes (Signed)
CSN: [id number]     Arrival date & time 01/16/15  1803 History   First MD Initiated Contact with Patient 01/16/15 1805     Chief Complaint  Patient presents with  . Chest Pain    HPI patient presents to the emergency room with complaints of sharp central chest pain that started this afternoon. Patient states the pain is somewhat intermittent in her central chest. Sometimes it radiates up towards her neck. At times it's a 7 out of 10. Nothing seems to make it particularly better or worse. She denies any trouble with fevers or coughing. She denies any shortness of breath. After the pain started today she took a Xanax. She started to feel itching in her face as well as itching in her arms. She also felt like her face and arms were swollen. She took her Zyrtec and that seemed to improve her symptoms. She denies any prior history of heart disease or DVT. She does not take any birth control medications.  Past Medical History  Diagnosis Date  . Genital HSV     Last OB: > 50yr ago; Valtrex 500 mg po prn outbreaks  . Thyroid disease   . Mood swings   . Headache(784.0)   . Chlamydia   . Trichomonas   . Anemia   . Gestational diabetes     diet controlled  . Infection     UTI  . Anxiety     off meds with preg  . Panic attack   . Carpal tunnel syndrome   . GERD (gastroesophageal reflux disease)   . IBS (irritable bowel syndrome)    Past Surgical History  Procedure Laterality Date  . Induced abortion  2009  . Vaginal delivery  2007  . Cesarean section N/A 05/20/2013    Procedure: CESAREAN SECTION;  Surgeon: Brock Bad, MD;  Location: [facility];  Service: Obstetrics;  Laterality: N/A;   Family History  Problem Relation Age of Onset  . Breast cancer Other   . Depression Mother   . Anxiety disorder Mother   . Bipolar disorder Brother   . Diabetes Maternal Grandmother   . Cancer Maternal Grandmother   . Hypertension Maternal Grandmother   . Hypertension Paternal Grandmother   . Autism     . Schizophrenia     History  Substance Use Topics  . Smoking status: Former Games developer  . Smokeless tobacco: Never Used     Comment: was not a daily smoker, tried it  . Alcohol Use: Yes     Comment: occas; not with preg   OB History    Gravida Para Term Preterm AB TAB SAB Ectopic Multiple Living   Review of Systems  All other systems reviewed and are negative.     Allergies  Zofran  Home Medications   Prior to Admission medications   Medication Sig Start Date End Date Taking? Authorizing Provider  albuterol (PROVENTIL HFA;VENTOLIN HFA) 108 (90 BASE) MCG/ACT inhaler Inhale 1-2 puffs into the lungs every 6 (six) hours as needed for wheezing or shortness of breath. 07/12/14   Elpidio Anis, PA-C  ALPRAZolam Prudy Feeler) 1 MG tablet Take 1 mg by mouth 3 (three) times daily as needed for anxiety.    Historical Provider, MD  diphenhydrAMINE (BENADRYL) 25 MG tablet Take 1 tablet (25 mg total) by mouth every 6 (six) hours. 12/18/14   Robyn M Hess, PA-C  fluconazole (DIFLUCAN) 150 MG tablet  Take 1 tablet (150 mg total) by mouth daily. 10/15/14   Bertram DenverKaren E Teague Clark, PA-C  gabapentin (NEURONTIN) 300 MG capsule Take 300 mg by mouth 2 (two) times daily.    Historical Provider, MD  HYDROcodone-acetaminophen (NORCO/VICODIN) 5-325 MG per tablet Take 1-2 tablets by mouth every 6 (six) hours as needed for moderate pain. 10/15/14   Bertram DenverKaren E Teague Clark, PA-C  hydrocortisone cream 1 % Apply to affected area 2 times daily 12/18/14   Kathrynn Speedobyn M Hess, PA-C  misoprostol (CYTOTEC) 200 MCG tablet Place 4 tablets (800 mcg total) vaginally once. Insert all 4 tablets into the vagina at once. 10/15/14   Bertram DenverKaren E Teague Clark, PA-C  naproxen (NAPROSYN) 500 MG tablet Take 1 tablet (500 mg total) by mouth 2 (two) times daily with a meal. As needed for pain 01/16/15   Linwood DibblesJon Kemiah Booz, MD  promethazine (PHENERGAN) 25 MG tablet Take 0.5 tablets (12.5 mg total) by mouth every 6 (six) hours as needed for nausea or  vomiting. Patient not taking: Reported on 10/15/2014 08/10/14   Wilmer FloorLisa A Leftwich-Kirby, CNM  valACYclovir (VALTREX) 500 MG tablet Take 1 tablet (500 mg total) by mouth daily. May use bid for 3 days for outbreak. 04/01/14   Antionette CharLisa Jackson-Moore, MD   BP 115/63 mmHg  Pulse 94  Temp(Src) 98.4 F (36.9 C) (Oral)  Resp 24  Ht 5\' 7"  (1.702 m)  Wt 256 lb (116.121 kg)  BMI 40.09 kg/m2  SpO2 99%  LMP 01/12/2015 Physical Exam  Constitutional: She appears well-developed and well-nourished. No distress.  Overweight  HENT:  Head: Normocephalic and atraumatic.  Right Ear: External ear normal.  Left Ear: External ear normal.  Eyes: Conjunctivae are normal. Right eye exhibits no discharge. Left eye exhibits no discharge. No scleral icterus.  Neck: Neck supple. No tracheal deviation present.  Cardiovascular: Normal rate, regular rhythm and intact distal pulses.   Pulmonary/Chest: Effort normal and breath sounds normal. No stridor. No respiratory distress. She has no wheezes. She has no rales.  Abdominal: Soft. Bowel sounds are normal. She exhibits no distension. There is no tenderness. There is no rebound and no guarding.  Musculoskeletal: She exhibits no edema or tenderness.  Neurological: She is alert. She has normal strength. No cranial nerve deficit (no facial droop, extraocular movements intact, no slurred speech) or sensory deficit. She exhibits normal muscle tone. She displays no seizure activity. Coordination normal.  Skin: Skin is warm and dry. No rash noted.  Psychiatric: She has a normal mood and affect.  Nursing note and vitals reviewed.   ED Course  Procedures (including critical care time) Labs Review Labs Reviewed  POC URINE PREG, ED    Imaging Review Dg Chest 2 View  01/16/2015   CLINICAL DATA:  Patient with chest pain, shortness of breath denies.  EXAM: CHEST  2 VIEW  COMPARISON:  Chest radiograph 10/20/2011  FINDINGS: The heart size and mediastinal contours are within normal  limits. Both lungs are clear. The visualized skeletal structures are unremarkable.  IMPRESSION: No acute cardiopulmonary process.   Electronically Signed   By: Annia Beltrew  Davis M.D.   On: 01/16/2015 18:53     EKG Interpretation   Date/Time:  Sunday January 16 2015 18:08:23 EDT Ventricular Rate:  92 PR Interval:  136 QRS Duration: 91 QT Interval:  337 QTC Calculation: 417 R Axis:   36 Text Interpretation:  Sinus rhythm Since last tracing rate slower  Confirmed by Debera Sterba  MD-J, Delitha Elms ([id number]) on 01/16/2015 6:18:52 PM  MDM   Final diagnoses:  Chest pain, unspecified chest pain type    Doubt symptoms are related to acute coronary syndrome. The patient is low risk for PE.  PERC Negative.  Patient had several questions while she was here about her thyroid, whether or not she could be a diabetic, what has been causing some of these rashes.  I suspect there could be an anxiety component. I did talk to her about gestational diabetes. Patient has had subsequent urinalysis that did not show any glucose urea.  Patient also describes issues with her thyroid that sounds like a thyroid nodule. She has never been on any medications. She was told to have a routine screening. She does have plans to see her primary doctor.  Patient does not have any rash on exam right now. I recommended follow-up with her primary doctor.    Linwood Dibbles, MD 01/16/15 [id number]

## 2015-01-16 NOTE — ED Notes (Signed)
Pt brought in the ED via [facility] with complaints of chest pain. Pt describes chest pain as intermittent and sharp located in central chest. Pt rates pain a 7/10. Pt took 1mg  of Xanax and started itching, and noticed she was having some facial and bilateral arm swelling. Pt medicated with zyrtec. EMS V/S HR 98, CBG 122, RR 16 SPO2 97%, BP 110/76.

## 2015-01-25 ENCOUNTER — Ambulatory Visit: Payer: Medicaid Other | Admitting: Certified Nurse Midwife

## 2015-02-26 ENCOUNTER — Other Ambulatory Visit (HOSPITAL_COMMUNITY)
Admission: RE | Admit: 2015-02-26 | Discharge: 2015-02-26 | Disposition: A | Discharge status: Home / Self Care | Payer: Medicaid Other | Source: Ambulatory Visit | Attending: Family Medicine | Admitting: Family Medicine

## 2015-02-26 ENCOUNTER — Emergency Department (INDEPENDENT_AMBULATORY_CARE_PROVIDER_SITE_OTHER)
Admission: EM | Admit: 2015-02-26 | Discharge: 2015-02-26 | Disposition: A | Discharge status: Home / Self Care | Payer: Medicaid Other | Source: Home / Self Care | Attending: Family Medicine | Admitting: Family Medicine

## 2015-02-26 ENCOUNTER — Encounter (HOSPITAL_COMMUNITY): Payer: Self-pay

## 2015-02-26 DIAGNOSIS — Z113 Encounter for screening for infections with a predominantly sexual mode of transmission: Secondary | ICD-10-CM | POA: Insufficient documentation

## 2015-02-26 DIAGNOSIS — R109 Unspecified abdominal pain: Secondary | ICD-10-CM

## 2015-02-26 DIAGNOSIS — N898 Other specified noninflammatory disorders of vagina: Secondary | ICD-10-CM

## 2015-02-26 DIAGNOSIS — B373 Candidiasis of vulva and vagina: Secondary | ICD-10-CM | POA: Diagnosis not present

## 2015-02-26 DIAGNOSIS — N760 Acute vaginitis: Secondary | ICD-10-CM | POA: Insufficient documentation

## 2015-02-26 DIAGNOSIS — B3731 Acute candidiasis of vulva and vagina: Secondary | ICD-10-CM

## 2015-02-26 LAB — POCT URINALYSIS DIP (DEVICE)
GLUCOSE, UA: 100 mg/dL — AB
HGB URINE DIPSTICK: NEGATIVE
Ketones, ur: NEGATIVE mg/dL
Leukocytes, UA: NEGATIVE
NITRITE: NEGATIVE
PROTEIN: 100 mg/dL — AB
Urobilinogen, UA: >=8 mg/dL (ref 0.0–1.0)
pH: 6.5 (ref 5.0–8.0)

## 2015-02-26 LAB — POCT PREGNANCY, URINE: Preg Test, Ur: NEGATIVE

## 2015-02-26 MED ORDER — KETOROLAC TROMETHAMINE 60 MG/2ML IM SOLN
60.0000 mg | Freq: Once | INTRAMUSCULAR | Status: AC
  Administered 2015-02-26: 60 mg via INTRAMUSCULAR

## 2015-02-26 MED ORDER — IBUPROFEN 600 MG PO TABS: 600.0000 mg | ORAL_TABLET | Freq: Four times a day (QID) | ORAL | Status: DC | PRN

## 2015-02-26 MED ORDER — VALACYCLOVIR HCL 1 G PO TABS: 500.0000 mg | ORAL_TABLET | Freq: Three times a day (TID) | ORAL | Status: AC

## 2015-02-26 MED ORDER — FLUCONAZOLE 150 MG PO TABS: 150.0000 mg | ORAL_TABLET | Freq: Once | ORAL | Status: DC

## 2015-02-26 MED ORDER — KETOROLAC TROMETHAMINE 60 MG/2ML IM SOLN
INTRAMUSCULAR | Status: AC
  Filled 2015-02-26: qty 2

## 2015-02-26 NOTE — ED Provider Notes (Addendum)
CSN: [id number]     Arrival date & time 02/26/15  1100 History   First MD Initiated Contact with Patient 02/26/15 1346     Chief Complaint  Patient presents with  . Abdominal Pain   (Consider location/radiation/quality/duration/timing/severity/associated sxs/prior Treatment) Patient is a 29 y.o. female presenting with abdominal pain. The history is provided by the patient.  Abdominal Pain Pain location:  LLQ and RLQ Pain quality: aching and cramping   Pain severity:  Severe Onset quality:  Sudden Duration:  2 days Progression:  Waxing and waning Context: not sick contacts and not trauma   Relieved by:  NSAIDs Worsened by:  Nothing tried Ineffective treatments:  OTC medications and NSAIDs Associated symptoms: vaginal discharge   Associated symptoms: no anorexia, no chest pain, no chills, no cough, no diarrhea, no dysuria, no fever, no hematemesis, no hematochezia, no hematuria, no melena, no nausea, no shortness of breath and no vomiting   Risk factors: obesity   Risk factors: no aspirin use     Past Medical History  Diagnosis Date  . Genital HSV     Last OB: > 12yr ago; Valtrex 500 mg po prn outbreaks  . Thyroid disease   . Mood swings   . Headache(784.0)   . Chlamydia   . Trichomonas   . Anemia   . Gestational diabetes     diet controlled  . Infection     UTI  . Anxiety     off meds with preg  . Panic attack   . Carpal tunnel syndrome   . GERD (gastroesophageal reflux disease)   . IBS (irritable bowel syndrome)    Past Surgical History  Procedure Laterality Date  . Induced abortion  2009  . Vaginal delivery  2007  . Cesarean section N/A 05/20/2013    Procedure: CESAREAN SECTION;  Surgeon: Brock Bad, MD;  Location: [facility];  Service: Obstetrics;  Laterality: N/A;   Family History  Problem Relation Age of Onset  . Breast cancer Other   . Depression Mother   . Anxiety disorder Mother   . Bipolar disorder Brother   . Diabetes Maternal Grandmother   .  Cancer Maternal Grandmother   . Hypertension Maternal Grandmother   . Hypertension Paternal Grandmother   . Autism    . Schizophrenia     History  Substance Use Topics  . Smoking status: Former Games developer  . Smokeless tobacco: Never Used     Comment: was not a daily smoker, tried it  . Alcohol Use: Yes     Comment: occas; not with preg   OB History    Gravida Para Term Preterm AB TAB SAB Ectopic Multiple Living   Review of Systems  Constitutional: Negative for fever and chills.  Respiratory: Negative for cough and shortness of breath.   Cardiovascular: Negative for chest pain.  Gastrointestinal: Positive for abdominal pain. Negative for nausea, vomiting, diarrhea, melena, hematochezia, anorexia and hematemesis.  Genitourinary: Positive for vaginal discharge. Negative for dysuria and hematuria.    Allergies  Zofran  Home Medications   Prior to Admission medications   Medication Sig Start Date End Date Taking? Authorizing Provider  albuterol (PROVENTIL HFA;VENTOLIN HFA) 108 (90 BASE) MCG/ACT inhaler Inhale 1-2 puffs into the lungs every 6 (six) hours as needed for wheezing or shortness of breath. 07/12/14   Elpidio Anis, PA-C  ALPRAZolam Prudy Feeler) 1 MG tablet Take 1 mg by mouth 3 (  three) times daily as needed for anxiety.    Historical Provider, MD  fluconazole (DIFLUCAN) 150 MG tablet Take 1 tablet (150 mg total) by mouth once. Repeat if needed 02/26/15   Ofilia NeasMichael L Amai Cappiello, PA-C  misoprostol (CYTOTEC) 200 MCG tablet Place 4 tablets (800 mcg total) vaginally once. Insert all 4 tablets into the vagina at once. 10/15/14   Bertram DenverKaren E Teague Zoey Bidwell, PA-C  valACYclovir (VALTREX) 1000 MG tablet Take 0.5 tablets (500 mg total) by mouth 3 (three) times daily. Take 3 times daily for three days at onset of outbreak. 02/26/15 03/12/15  Ofilia NeasMichael L Eloy Fehl, PA-C   BP 124/85 mmHg  Pulse 82  Temp(Src) 97.6 F (36.4 C) (Oral)  Resp 20  SpO2 100%  LMP 02/08/2015 (Approximate)   Breastfeeding? No Physical Exam  Constitutional: She is oriented to person, place, and time. She appears well-developed and well-nourished. No distress.  Neck: Normal range of motion.  Cardiovascular: Normal rate, regular rhythm and normal heart sounds.  Exam reveals no gallop and no friction rub.   No murmur heard. Pulmonary/Chest: Effort normal and breath sounds normal.  Abdominal: Soft. Bowel sounds are normal. She exhibits no distension and no mass. There is no tenderness. There is no rebound and no guarding.  Genitourinary: There is no rash, tenderness, lesion or injury on the right labia. There is no rash, tenderness, lesion or injury on the left labia. Cervix exhibits discharge (White. Curd like. ). Cervix exhibits no motion tenderness and no friability. Right adnexum displays no mass, no tenderness and no fullness. Left adnexum displays no mass, no tenderness and no fullness. Vaginal discharge (White. Curd like. ) found.  Musculoskeletal: Normal range of motion. She exhibits no edema or tenderness.  Neurological: She is alert and oriented to person, place, and time.  Skin: Skin is warm and dry. She is not diaphoretic.  Psychiatric: She has a normal mood and affect.  Vitals reviewed.   ED Course  Procedures (including critical care time) Labs Review Labs Reviewed  POCT URINALYSIS DIP (DEVICE) - Abnormal; Notable for the following:    Glucose, UA 100 (*)    Bilirubin Urine SMALL (*)    Protein, ur 100 (*)    All other components within normal limits  POCT PREGNANCY, URINE    Imaging Review No results found.   MDM   1. Vaginal discharge   2. Abdominal cramping   3. Yeast vaginitis    Patient with vague symptoms and reassuring belly exam.  Vaginal mucosa with copious yeast.  Will treat with diflucan. Patient with good relief of pain with 60 im toradol.      Ofilia NeasMichael L Laquesha Holcomb, PA-C 02/26/15 1432  Ofilia NeasMichael L Knoah Nedeau, PA-C 02/26/15 [phone]

## 2015-02-26 NOTE — ED Notes (Signed)
C/o abdominal pain and cramping x2-3 days

## 2015-03-01 LAB — GC/CHLAMYDIA PROBE AMP (~~LOC~~) NOT AT ARMC
Chlamydia: NEGATIVE
NEISSERIA GONORRHEA: NEGATIVE

## 2015-03-01 LAB — CERVICOVAGINAL ANCILLARY ONLY: Wet Prep (BD Affirm): NEGATIVE

## 2015-04-20 ENCOUNTER — Inpatient Hospital Stay (HOSPITAL_COMMUNITY)
Admission: AD | Admit: 2015-04-20 | Discharge: 2015-04-20 | Disposition: A | Discharge status: Home / Self Care | Payer: Medicaid Other | Source: Ambulatory Visit | Attending: Obstetrics and Gynecology | Admitting: Obstetrics and Gynecology

## 2015-04-20 ENCOUNTER — Encounter (HOSPITAL_COMMUNITY): Payer: Self-pay | Admitting: *Deleted

## 2015-04-20 DIAGNOSIS — Z3202 Encounter for pregnancy test, result negative: Secondary | ICD-10-CM | POA: Insufficient documentation

## 2015-04-20 DIAGNOSIS — N912 Amenorrhea, unspecified: Secondary | ICD-10-CM | POA: Insufficient documentation

## 2015-04-20 DIAGNOSIS — R350 Frequency of micturition: Secondary | ICD-10-CM | POA: Diagnosis not present

## 2015-04-20 DIAGNOSIS — Z87891 Personal history of nicotine dependence: Secondary | ICD-10-CM | POA: Insufficient documentation

## 2015-04-20 DIAGNOSIS — Z3200 Encounter for pregnancy test, result unknown: Secondary | ICD-10-CM | POA: Diagnosis present

## 2015-04-20 DIAGNOSIS — N926 Irregular menstruation, unspecified: Secondary | ICD-10-CM

## 2015-04-20 LAB — URINALYSIS, ROUTINE W REFLEX MICROSCOPIC
Bilirubin Urine: NEGATIVE
Glucose, UA: NEGATIVE mg/dL
HGB URINE DIPSTICK: NEGATIVE
Ketones, ur: NEGATIVE mg/dL
LEUKOCYTES UA: NEGATIVE
Nitrite: NEGATIVE
Protein, ur: NEGATIVE mg/dL
SPECIFIC GRAVITY, URINE: 1.02 (ref 1.005–1.030)
Urobilinogen, UA: 1 mg/dL (ref 0.0–1.0)
pH: 6.5 (ref 5.0–8.0)

## 2015-04-20 LAB — POCT PREGNANCY, URINE: Preg Test, Ur: NEGATIVE

## 2015-04-20 NOTE — MAU Provider Note (Signed)
History     CSN: [id number]  Arrival date and time: 04/20/15 1539   First Provider Initiated Contact with Patient 04/20/15 1616      Chief Complaint  Patient presents with  . Urinary Frequency  . Back Pain   HPI   [name] is a 29 y.o. female [phone] presenting to MAU for a pregnancy test. She has symptoms of pregnancy, missed period, urine frequency, and back pain. She would like to have her urine checked for UTI.   Patient has a history of HSV and has been taking valtrex incase the frequency is due to an outbreak.  Patient would like a blood hcg test today.    OB History    Gravida Para Term Preterm AB TAB SAB Ectopic Multiple Living   Past Medical History  Diagnosis Date  . Genital HSV     Last OB: > 41yr ago; Valtrex 500 mg po prn outbreaks  . Thyroid disease   . Mood swings   . Headache(784.0)   . Chlamydia   . Trichomonas   . Anemia   . Gestational diabetes     diet controlled  . Infection     UTI  . Anxiety     off meds with preg  . Panic attack   . Carpal tunnel syndrome   . GERD (gastroesophageal reflux disease)   . IBS (irritable bowel syndrome)     Past Surgical History  Procedure Laterality Date  . Induced abortion  2009  . Vaginal delivery  2007  . Cesarean section N/A 05/20/2013    Procedure: CESAREAN SECTION;  Surgeon: Brock Bad, MD;  Location: [facility];  Service: Obstetrics;  Laterality: N/A;    Family History  Problem Relation Age of Onset  . Breast cancer Other   . Depression Mother   . Anxiety disorder Mother   . Bipolar disorder Brother   . Diabetes Maternal Grandmother   . Cancer Maternal Grandmother   . Hypertension Maternal Grandmother   . Hypertension Paternal Grandmother   . Autism    . Schizophrenia      History  Substance Use Topics  . Smoking status: Former Games developer  . Smokeless tobacco: Never Used     Comment: was not a daily smoker, tried it  . Alcohol Use: Yes     Comment:  occas; not with preg    Allergies:  Allergies  Allergen Reactions  . Zofran [Ondansetron Hcl] Nausea And Vomiting    Prescriptions prior to admission  Medication Sig Dispense Refill Last Dose  . ALPRAZolam (XANAX) 1 MG tablet Take 1 mg by mouth 3 (three) times daily as needed for anxiety.   04/20/2015 at Unknown time  . UNKNOWN TO PATIENT      . albuterol (PROVENTIL HFA;VENTOLIN HFA) 108 (90 BASE) MCG/ACT inhaler Inhale 1-2 puffs into the lungs every 6 (six) hours as needed for wheezing or shortness of breath. 1 Inhaler 0   . fluconazole (DIFLUCAN) 150 MG tablet Take 1 tablet (150 mg total) by mouth once. Repeat if needed 2 tablet 0   . ibuprofen (ADVIL,MOTRIN) 600 MG tablet Take 1 tablet (600 mg total) by mouth every 6 (six) hours as needed. 30 tablet 0   . misoprostol (CYTOTEC) 200 MCG tablet Place 4 tablets (800 mcg total) vaginally once. Insert all 4 tablets into the vagina at once. 4 tablet 0    Results for  orders placed or performed during the hospital encounter of 04/20/15 (from the past 48 hour(s))  Urinalysis, Routine w reflex microscopic ([facility])     Status: None   Collection Time: 04/20/15  3:48 PM  Result Value Ref Range   Color, Urine YELLOW YELLOW   APPearance CLEAR CLEAR   Specific Gravity, Urine 1.020 1.005 - 1.030   pH 6.5 5.0 - 8.0   Glucose, UA NEGATIVE NEGATIVE mg/dL   Hgb urine dipstick NEGATIVE NEGATIVE   Bilirubin Urine NEGATIVE NEGATIVE   Ketones, ur NEGATIVE NEGATIVE mg/dL   Protein, ur NEGATIVE NEGATIVE mg/dL   Urobilinogen, UA 1.0 0.0 - 1.0 mg/dL   Nitrite NEGATIVE NEGATIVE   Leukocytes, UA NEGATIVE NEGATIVE    Comment: MICROSCOPIC NOT DONE ON URINES WITH NEGATIVE PROTEIN, BLOOD, LEUKOCYTES, NITRITE, OR GLUCOSE <1000 mg/dL.  Pregnancy, urine POC     Status: None   Collection Time: 04/20/15  4:10 PM  Result Value Ref Range   Preg Test, Ur NEGATIVE NEGATIVE    Comment:        THE SENSITIVITY OF THIS METHODOLOGY IS >24 mIU/mL     Review of  Systems  Constitutional: Negative for fever.  Genitourinary: Positive for urgency and frequency. Negative for dysuria and hematuria.   Physical Exam   Blood pressure 116/72, pulse 99, temperature 98 F (36.7 C), temperature source Oral, resp. rate 18, height 5\' 7"  (1.702 m), last menstrual period 03/15/2015, not currently breastfeeding.  Physical Exam  Constitutional: She is oriented to person, place, and time. She appears well-developed and well-nourished. No distress.  HENT:  Head: Normocephalic.  Eyes: Pupils are equal, round, and reactive to light.  Musculoskeletal: Normal range of motion.  Neurological: She is alert and oriented to person, place, and time.  Skin: Skin is warm. She is not diaphoretic.  Psychiatric: Her behavior is normal.    MAU Course  Procedures  None  MDM   Assessment and Plan   A:  1. Missed period   2. Urine frequency    P:  Discharge home in stable condition Home pregnancy tests encouraged Return to MAU for emergencies only Condoms only. Contact the [facility] or [facility] for nexplanon or IUD placement per patient request.   Duane LopeJennifer I Rasch, NP 04/20/2015 4:18 PM

## 2015-04-20 NOTE — MAU Note (Signed)
Patient states she has been having urinary frequency and back pain and wonders if she might be pregnant.  Her LMP was 03/15/15.  Patient concerned that she is taking antianxiety medications and wants to know if she is pregnant.

## 2015-08-02 ENCOUNTER — Emergency Department (HOSPITAL_COMMUNITY)
Admission: EM | Admit: 2015-08-02 | Discharge: 2015-08-02 | Disposition: A | Discharge status: Home / Self Care | Payer: Medicaid Other | Attending: Emergency Medicine | Admitting: Emergency Medicine

## 2015-08-02 ENCOUNTER — Encounter (HOSPITAL_COMMUNITY): Payer: Self-pay | Admitting: Cardiology

## 2015-08-02 DIAGNOSIS — Z8719 Personal history of other diseases of the digestive system: Secondary | ICD-10-CM | POA: Diagnosis not present

## 2015-08-02 DIAGNOSIS — Z87440 Personal history of urinary (tract) infections: Secondary | ICD-10-CM | POA: Insufficient documentation

## 2015-08-02 DIAGNOSIS — F419 Anxiety disorder, unspecified: Secondary | ICD-10-CM | POA: Insufficient documentation

## 2015-08-02 DIAGNOSIS — N760 Acute vaginitis: Secondary | ICD-10-CM | POA: Diagnosis not present

## 2015-08-02 DIAGNOSIS — Z79899 Other long term (current) drug therapy: Secondary | ICD-10-CM | POA: Diagnosis not present

## 2015-08-02 DIAGNOSIS — B9689 Other specified bacterial agents as the cause of diseases classified elsewhere: Secondary | ICD-10-CM

## 2015-08-02 DIAGNOSIS — Z87891 Personal history of nicotine dependence: Secondary | ICD-10-CM | POA: Diagnosis not present

## 2015-08-02 DIAGNOSIS — Z8632 Personal history of gestational diabetes: Secondary | ICD-10-CM | POA: Diagnosis not present

## 2015-08-02 DIAGNOSIS — Z3202 Encounter for pregnancy test, result negative: Secondary | ICD-10-CM | POA: Insufficient documentation

## 2015-08-02 DIAGNOSIS — R1084 Generalized abdominal pain: Secondary | ICD-10-CM | POA: Diagnosis present

## 2015-08-02 DIAGNOSIS — Z862 Personal history of diseases of the blood and blood-forming organs and certain disorders involving the immune mechanism: Secondary | ICD-10-CM | POA: Diagnosis not present

## 2015-08-02 DIAGNOSIS — Z8619 Personal history of other infectious and parasitic diseases: Secondary | ICD-10-CM | POA: Diagnosis not present

## 2015-08-02 LAB — URINE MICROSCOPIC-ADD ON

## 2015-08-02 LAB — WET PREP, GENITAL
TRICH WET PREP: NONE SEEN
WBC, Wet Prep HPF POC: NONE SEEN
Yeast Wet Prep HPF POC: NONE SEEN

## 2015-08-02 LAB — CBC
HCT: 34.3 % — ABNORMAL LOW (ref 36.0–46.0)
Hemoglobin: 10.5 g/dL — ABNORMAL LOW (ref 12.0–15.0)
MCH: 22.6 pg — ABNORMAL LOW (ref 26.0–34.0)
MCHC: 30.6 g/dL (ref 30.0–36.0)
MCV: 73.8 fL — ABNORMAL LOW (ref 78.0–100.0)
Platelets: 319 10*3/uL (ref 150–400)
RBC: 4.65 MIL/uL (ref 3.87–5.11)
RDW: 16.8 % — ABNORMAL HIGH (ref 11.5–15.5)
WBC: 9.7 10*3/uL (ref 4.0–10.5)

## 2015-08-02 LAB — URINALYSIS, ROUTINE W REFLEX MICROSCOPIC
Bilirubin Urine: NEGATIVE
Glucose, UA: NEGATIVE mg/dL
Hgb urine dipstick: NEGATIVE
Ketones, ur: NEGATIVE mg/dL
Leukocytes, UA: NEGATIVE
Nitrite: NEGATIVE
Protein, ur: 30 mg/dL — AB
Specific Gravity, Urine: 1.023 (ref 1.005–1.030)
Urobilinogen, UA: 1 mg/dL (ref 0.0–1.0)
pH: 7 (ref 5.0–8.0)

## 2015-08-02 LAB — COMPREHENSIVE METABOLIC PANEL
ALT: 15 U/L (ref 14–54)
AST: 14 U/L — ABNORMAL LOW (ref 15–41)
Albumin: 3.6 g/dL (ref 3.5–5.0)
Alkaline Phosphatase: 68 U/L (ref 38–126)
Anion gap: 10 (ref 5–15)
BUN: 10 mg/dL (ref 6–20)
CO2: 25 mmol/L (ref 22–32)
Calcium: 9.5 mg/dL (ref 8.9–10.3)
Chloride: 104 mmol/L (ref 101–111)
Creatinine, Ser: 0.6 mg/dL (ref 0.44–1.00)
GFR calc Af Amer: >60 mL/min (ref >60)
GFR calc non Af Amer: >60 mL/min (ref >60)
Glucose, Bld: 124 mg/dL — ABNORMAL HIGH (ref 65–99)
Potassium: 3.6 mmol/L (ref 3.5–5.1)
Sodium: 139 mmol/L (ref 135–145)
Total Bilirubin: 0.8 mg/dL (ref 0.3–1.2)
Total Protein: 7.6 g/dL (ref 6.5–8.1)

## 2015-08-02 LAB — LIPASE, BLOOD: Lipase: 24 U/L (ref 11–51)

## 2015-08-02 LAB — I-STAT BETA HCG BLOOD, ED (MC, WL, AP ONLY): I-stat hCG, quantitative: <5 m[IU]/mL (ref <5)

## 2015-08-02 LAB — POC URINE PREG, ED: Preg Test, Ur: NEGATIVE

## 2015-08-02 MED ORDER — CEFTRIAXONE SODIUM 250 MG IJ SOLR
250.0000 mg | Freq: Once | INTRAMUSCULAR | Status: AC
  Administered 2015-08-02: 250 mg via INTRAMUSCULAR
  Filled 2015-08-02: qty 250

## 2015-08-02 MED ORDER — METRONIDAZOLE 0.75 % VA GEL: 1.0000 | Freq: Two times a day (BID) | VAGINAL | Status: DC

## 2015-08-02 MED ORDER — AZITHROMYCIN 250 MG PO TABS
1000.0000 mg | ORAL_TABLET | Freq: Once | ORAL | Status: AC
  Administered 2015-08-02: 1000 mg via ORAL
  Filled 2015-08-02: qty 4

## 2015-08-02 NOTE — ED Notes (Signed)
Reports abd pain, lower back pain and vaginal discharge for the past week. Reports unprotected sex recently and wants to be checked.

## 2015-08-02 NOTE — ED Provider Notes (Signed)
CSN: [id number]     Arrival date & time 08/02/15  1222 History   [id number] MD Initiated Contact with Patient 08/02/15 1514     Chief Complaint  Patient presents with  . Abdominal Pain     (Consider location/radiation/quality/duration/timing/severity/associated sxs/prior Treatment) HPI  29 year old female with history of of GERD and IBS who presents with intermittent generalized abdominal discomfort for the past 5 days. This is associated with copious white vaginal discharge, which she states that she developed after having unprotected intercourse. Has had similar presentation in the past, which she states has been due to an STD. With concern for STD today and presents to the ED for evaluation.   No vaginal bleeding No vomiting or diarrhea No fever No dysuria or frequency  Past Medical History  Diagnosis Date  . Genital HSV     Last OB: > 3841yr ago; Valtrex 500 mg po prn outbreaks  . Thyroid disease   . Mood swings (HCC)   . Headache(784.0)   . Chlamydia   . Trichomonas   . Anemia   . Gestational diabetes     diet controlled  . Infection     UTI  . Anxiety     off meds with preg  . Panic attack   . Carpal tunnel syndrome   . GERD (gastroesophageal reflux disease)   . IBS (irritable bowel syndrome)    Past Surgical History  Procedure Laterality Date  . Induced abortion  2009  . Vaginal delivery  2007  . Cesarean section N/A 05/20/2013    Procedure: CESAREAN SECTION;  Surgeon: [name], MD;  Location: [facility];  Service: Obstetrics;  Laterality: N/A;   Family History  Problem Relation Age of Onset  . Breast cancer Other   . Depression Mother   . Anxiety disorder Mother   . Bipolar disorder Brother   . Diabetes Maternal Grandmother   . Cancer Maternal Grandmother   . Hypertension Maternal Grandmother   . Hypertension Paternal Grandmother   . Autism    . Schizophrenia     Social History  Substance Use Topics  . Smoking status: Former Games developermoker  . Smokeless  tobacco: Never Used     Comment: was not a daily smoker, tried it  . Alcohol Use: Yes     Comment: occas; not with preg   OB History    Gravida Para Term Preterm AB TAB SAB Ectopic Multiple Living   8 3 3  5 3 2   2      Review of Systems 10/14 systems reviewed and are negative other than those stated in the HPI    Allergies  Zofran  Home Medications   Prior to Admission medications   Medication Sig Start Date End Date Taking? Authorizing Provider  ALPRAZolam Prudy Feeler(XANAX) 1 MG tablet Take 1 mg by mouth 3 (three) times daily as needed for anxiety.   Yes Historical Provider, MD  divalproex (DEPAKOTE) 250 MG DR tablet Take 250 mg by mouth 2 (two) times daily.   Yes Historical Provider, MD  metroNIDAZOLE (METROGEL VAGINAL) 0.75 % vaginal gel Place 1 Applicatorful vaginally 2 (two) times daily. 08/02/15   Lavera Guiseana Duo Shoshannah Faubert, MD  UNKNOWN TO PATIENT     Historical Provider, MD   BP 129/83 mmHg  Pulse 104  Temp(Src) 97.9 F (36.6 C) (Oral)  Resp 16  Wt 245 lb (111.131 kg)  SpO2 100%  LMP 07/18/2015 Physical Exam Physical Exam  Nursing note and vitals reviewed. Constitutional: Well developed, well  nourished, non-toxic, and in no acute distress Head: Normocephalic and atraumatic.  Mouth/Throat: Oropharynx is clear and moist.  Neck: Normal range of motion. Neck supple.  Cardiovascular: Normal rate and regular rhythm.   Pulmonary/Chest: Effort normal and breath sounds normal.  Abdominal: Soft. There is no tenderness. There is no rebound and no guarding.  Musculoskeletal: Normal range of motion.  Neurological: Alert, no facial droop, fluent speech, moves all extremities symmetrically Skin: Skin is warm and dry.  Psychiatric: Cooperative  Pelvic: Normal external genitalia. Normal internal genitalia. Copious white discharge. No blood within the vagina. No cervical motion tenderness. No adnexal masses or tenderness.  ED Course  Procedures (including critical care time) Labs Review Labs  Reviewed  WET PREP, GENITAL - Abnormal; Notable for the following:    Clue Cells Wet Prep HPF POC FEW (*)    All other components within normal limits  COMPREHENSIVE METABOLIC PANEL - Abnormal; Notable for the following:    Glucose, Bld 124 (*)    AST 14 (*)    All other components within normal limits  CBC - Abnormal; Notable for the following:    Hemoglobin 10.5 (*)    HCT 34.3 (*)    MCV 73.8 (*)    MCH 22.6 (*)    RDW 16.8 (*)    All other components within normal limits  URINALYSIS, ROUTINE W REFLEX MICROSCOPIC (NOT AT [facility]) - Abnormal; Notable for the following:    Protein, ur 30 (*)    All other components within normal limits  URINE MICROSCOPIC-ADD ON - Abnormal; Notable for the following:    Squamous Epithelial / LPF FEW (*)    All other components within normal limits  LIPASE, BLOOD  RPR  HIV ANTIBODY (ROUTINE TESTING)  I-STAT BETA HCG BLOOD, ED ([facility], [facility], [facility] ONLY)  POC URINE PREG, ED  GC/CHLAMYDIA PROBE AMP ([facility]) NOT AT [facility]   I have personally reviewed and evaluated these images and lab results as part of my medical decision-making.    MDM   Final diagnoses:  Bacterial vaginitis    29 year old female with history of GERD and IBS who presents with generalized abdominal discomfort and vaginal discharge. She is well-appearing and in no acute distress on presentation. Vital signs are within normal limits. On my exam, she states that she was no longer having any abdominal discomfort. Pelvic exam revealed diffuse copious white vaginal discharge without evidence of adnexal tenderness or cervical motion tenderness. No concern for PID. Given her risk factors, she is treated empirically for STDs with ceftriaxone IM and azithromycin. Also evidence of bacterial vaginitis on what prep. She is given a course of intravaginal metronidazole gel per patient's request for treatment. She is not pregnant, UA is unremarkable. Remainder of her blood work including CBC, comp metabolic  panel, lipase is unremarkable. I do not suspect serious or toxic etiology of her abdominal pain, which is no longer present on my evaluation. Strict return and follow-up instructions reviewed. She expressed understanding of all discharge instructions for comfortable to plan of care.    Lavera Guise, MD 08/02/15 [phone]

## 2015-08-02 NOTE — Discharge Instructions (Signed)
return without fail for worsening symptoms including worsening pain, vomiting unable to keep down food or fluids, fevers, or any other symptoms concerning to you.  Bacterial Vaginosis Bacterial vaginosis is a vaginal infection that occurs when the normal balance of bacteria in the vagina is disrupted. It results from an overgrowth of certain bacteria. This is the most common vaginal infection in women of childbearing age. Treatment is important to prevent complications, especially in pregnant women, as it can cause a premature delivery. CAUSES  Bacterial vaginosis is caused by an increase in harmful bacteria that are normally present in smaller amounts in the vagina. Several different kinds of bacteria can cause bacterial vaginosis. However, the reason that the condition develops is not fully understood. RISK FACTORS Certain activities or behaviors can put you at an increased risk of developing bacterial vaginosis, including:  Having a new sex partner or multiple sex partners.  Douching.  Using an intrauterine device (IUD) for contraception. Women do not get bacterial vaginosis from toilet seats, bedding, swimming pools, or contact with objects around them. SIGNS AND SYMPTOMS  Some women with bacterial vaginosis have no signs or symptoms. Common symptoms include:  Grey vaginal discharge.  A fishlike odor with discharge, especially after sexual intercourse.  Itching or burning of the vagina and vulva.  Burning or pain with urination. DIAGNOSIS  Your health care provider will take a medical history and examine the vagina for signs of bacterial vaginosis. A sample of vaginal fluid may be taken. Your health care provider will look at this sample under a microscope to check for bacteria and abnormal cells. A vaginal pH test may also be done.  TREATMENT  Bacterial vaginosis may be treated with antibiotic medicines. These may be given in the form of a pill or a vaginal cream. A second round of  antibiotics may be prescribed if the condition comes back after treatment. Because bacterial vaginosis increases your risk for sexually transmitted diseases, getting treated can help reduce your risk for chlamydia, gonorrhea, HIV, and herpes. HOME CARE INSTRUCTIONS   Only take over-the-counter or prescription medicines as directed by your health care provider.  If antibiotic medicine was prescribed, take it as directed. Make sure you finish it even if you start to feel better.  Tell all sexual partners that you have a vaginal infection. They should see their health care provider and be treated if they have problems, such as a mild rash or itching.  During treatment, it is important that you follow these instructions:  Avoid sexual activity or use condoms correctly.  Do not douche.  Avoid alcohol as directed by your health care provider.  Avoid breastfeeding as directed by your health care provider. SEEK MEDICAL CARE IF:   Your symptoms are not improving after 3 days of treatment.  You have increased discharge or pain.  You have a fever. MAKE SURE YOU:   Understand these instructions.  Will watch your condition.  Will get help right away if you are not doing well or get worse. FOR MORE INFORMATION  Centers for Disease Control and Prevention, Division of STD Prevention: SolutionApps.co.zawww.cdc.gov/std American Sexual Health Association (ASHA): www.ashastd.org    This information is not intended to replace advice given to you by your health care provider. Make sure you discuss any questions you have with your health care provider.   Document Released: 09/17/2005 Document Revised: 10/08/2014 Document Reviewed: 04/29/2013 Elsevier Interactive Patient Education [employer]2016 Elsevier Inc.

## 2015-08-02 NOTE — ED Notes (Signed)
Pt in a hurry to pick up son unable to get DC vitals.

## 2015-08-03 LAB — HIV ANTIBODY (ROUTINE TESTING W REFLEX): HIV Screen 4th Generation wRfx: NONREACTIVE

## 2015-08-03 LAB — RPR: RPR Ser Ql: NONREACTIVE

## 2015-08-05 LAB — GC/CHLAMYDIA PROBE AMP (~~LOC~~) NOT AT ARMC
Chlamydia: NEGATIVE
Neisseria Gonorrhea: NEGATIVE

## 2015-11-01 ENCOUNTER — Inpatient Hospital Stay (HOSPITAL_COMMUNITY)
Admission: AD | Admit: 2015-11-01 | Discharge: 2015-11-01 | Disposition: A | Discharge status: Home / Self Care | Payer: Medicaid Other | Source: Ambulatory Visit | Attending: Obstetrics & Gynecology | Admitting: Obstetrics & Gynecology

## 2015-11-01 ENCOUNTER — Encounter (HOSPITAL_COMMUNITY): Payer: Self-pay | Admitting: *Deleted

## 2015-11-01 DIAGNOSIS — R109 Unspecified abdominal pain: Secondary | ICD-10-CM | POA: Diagnosis present

## 2015-11-01 DIAGNOSIS — M549 Dorsalgia, unspecified: Secondary | ICD-10-CM | POA: Diagnosis not present

## 2015-11-01 DIAGNOSIS — R309 Painful micturition, unspecified: Secondary | ICD-10-CM | POA: Diagnosis not present

## 2015-11-01 DIAGNOSIS — Z3202 Encounter for pregnancy test, result negative: Secondary | ICD-10-CM | POA: Diagnosis not present

## 2015-11-01 DIAGNOSIS — M545 Low back pain, unspecified: Secondary | ICD-10-CM

## 2015-11-01 DIAGNOSIS — Z87891 Personal history of nicotine dependence: Secondary | ICD-10-CM | POA: Insufficient documentation

## 2015-11-01 LAB — WET PREP, GENITAL
CLUE CELLS WET PREP: NONE SEEN
Sperm: NONE SEEN
Trich, Wet Prep: NONE SEEN
Yeast Wet Prep HPF POC: NONE SEEN

## 2015-11-01 LAB — URINALYSIS, ROUTINE W REFLEX MICROSCOPIC
Bilirubin Urine: NEGATIVE
Glucose, UA: NEGATIVE mg/dL
Hgb urine dipstick: NEGATIVE
KETONES UR: NEGATIVE mg/dL
LEUKOCYTES UA: NEGATIVE
Nitrite: NEGATIVE
PROTEIN: NEGATIVE mg/dL
Specific Gravity, Urine: 1.02 (ref 1.005–1.030)
pH: 6 (ref 5.0–8.0)

## 2015-11-01 LAB — POCT PREGNANCY, URINE: Preg Test, Ur: NEGATIVE

## 2015-11-01 NOTE — MAU Provider Note (Signed)
History     CSN: [id number]  Arrival date and time: 11/01/15 1237   None     No chief complaint on file.  HPI  Pt is not pregnant [id number].  Pt presents with back pain and abdominal pain for over one week. Pt is bisexual but has recently been with a female.  Pt's female partner has a "strap" and pt thinks it is too big for her. Pt is concerned that she may have a vaginal infection; pt has some vaginal irritation and pain with urination.  Pt also states that she has had thyroid issues and does not have a PCP.  RN note:  Original Note by Job Founds Spurlock-Frizzell, RN (Registered Nurse) filed at 11/01/2015 12:53 PM   Expand All Collapse All   Been having back pain and abd for over a wk, started before cycle came on . cyle was late. Pt is bi, was with a man- thinks she may have bv or an infection. Diarrhea past 3 days. Some pain with urination neg CVA tenderness       Past Medical History  Diagnosis Date  . Genital HSV     Last OB: > 53yr ago; Valtrex 500 mg po prn outbreaks  . Thyroid disease   . Mood swings (HCC)   . Headache(784.0)   . Chlamydia   . Trichomonas   . Anemia   . Gestational diabetes     diet controlled  . Infection     UTI  . Anxiety     off meds with preg  . Panic attack   . Carpal tunnel syndrome   . GERD (gastroesophageal reflux disease)   . IBS (irritable bowel syndrome)     Past Surgical History  Procedure Laterality Date  . Induced abortion  2009  . Vaginal delivery  2007  . Cesarean section N/A 05/20/2013    Procedure: CESAREAN SECTION;  Surgeon: Brock Bad, MD;  Location: [facility];  Service: Obstetrics;  Laterality: N/A;    Family History  Problem Relation Age of Onset  . Breast cancer Other   . Depression Mother   . Anxiety disorder Mother   . Bipolar disorder Brother   . Diabetes Maternal Grandmother   . Cancer Maternal Grandmother   . Hypertension Maternal Grandmother   . Hypertension Paternal Grandmother   . Autism    .  Schizophrenia      Social History  Substance Use Topics  . Smoking status: Former Games developer  . Smokeless tobacco: Never Used     Comment: was not a daily smoker, tried it  . Alcohol Use: Yes     Comment: occas; not with preg    Allergies:  Allergies  Allergen Reactions  . Zofran [Ondansetron Hcl] Nausea And Vomiting    Prescriptions prior to admission  Medication Sig Dispense Refill Last Dose  . ALPRAZolam (XANAX) 1 MG tablet Take 1 mg by mouth 3 (three) times daily as needed for anxiety.   08/02/2015 at Unknown time  . divalproex (DEPAKOTE) 250 MG DR tablet Take 250 mg by mouth 2 (two) times daily.   08/01/2015 at Unknown time  . metroNIDAZOLE (METROGEL VAGINAL) 0.75 % vaginal gel Place 1 Applicatorful vaginally 2 (two) times daily. 70 g 0   . UNKNOWN TO PATIENT        Review of Systems  Constitutional: Negative for fever and chills.  Gastrointestinal: Positive for abdominal pain and diarrhea. Negative for nausea, vomiting and constipation.  Genitourinary: Negative for urgency, frequency  and hematuria.  Musculoskeletal: Positive for back pain.   Physical Exam   Blood pressure 125/88, pulse 85, temperature 98.2 F (36.8 C), temperature source Oral, resp. rate 20, weight 237 lb (107.502 kg), last menstrual period 10/23/2015.  Physical Exam  Nursing note and vitals reviewed. Constitutional: She is oriented to person, place, and time. She appears well-developed and well-nourished. No distress.  HENT:  Head: Normocephalic.  Eyes: Pupils are equal, round, and reactive to light.  Neck: Normal range of motion. Neck supple.  Cardiovascular: Normal rate.   Respiratory: Effort normal.  GI: Soft. She exhibits no distension. There is no tenderness. There is no rebound and no guarding.  No CVA tenderness  Genitourinary:  Small amount of white vaginal discharge.cervix clean, NT; adnexa without papable enlargement or tenderness  Musculoskeletal: Normal range of motion.  Neurological:  She is alert and oriented to person, place, and time.  Skin: Skin is warm and dry.  Psychiatric: She has a normal mood and affect.    MAU Course  Procedures Results for orders placed or performed during the hospital encounter of 11/01/15 (from the past 24 hour(s))  Urinalysis, Routine w reflex microscopic (not at [facility])     Status: None   Collection Time: 11/01/15  1:00 PM  Result Value Ref Range   Color, Urine YELLOW YELLOW   APPearance CLEAR CLEAR   Specific Gravity, Urine 1.020 1.005 - 1.030   pH 6.0 5.0 - 8.0   Glucose, UA NEGATIVE NEGATIVE mg/dL   Hgb urine dipstick NEGATIVE NEGATIVE   Bilirubin Urine NEGATIVE NEGATIVE   Ketones, ur NEGATIVE NEGATIVE mg/dL   Protein, ur NEGATIVE NEGATIVE mg/dL   Nitrite NEGATIVE NEGATIVE   Leukocytes, UA NEGATIVE NEGATIVE  Pregnancy, urine POC     Status: None   Collection Time: 11/01/15  1:03 PM  Result Value Ref Range   Preg Test, Ur NEGATIVE NEGATIVE  Wet prep, genital     Status: Abnormal   Collection Time: 11/01/15  2:22 PM  Result Value Ref Range   Yeast Wet Prep HPF POC NONE SEEN NONE SEEN   Trich, Wet Prep NONE SEEN NONE SEEN   Clue Cells Wet Prep HPF POC NONE SEEN NONE SEEN   WBC, Wet Prep HPF POC FEW (A) NONE SEEN   Sperm NONE SEEN     Assessment and Plan  Abdominal pain in female Pain with urination- increase water intake; no evidence of UTI Back pain- comfort measures/tylenol F/u with provider of choice- physicians given- Medicaid-recommended [facility]  Dawnmarie Breon 11/01/2015, 2:03 PM

## 2015-11-01 NOTE — MAU Note (Addendum)
Been having back pain and abd for over a wk, started before cycle came on . cyle was late. Pt is bi, was with a man- thinks she may have bv or an infection.  Diarrhea past 3 days. Some pain with urination neg CVA tenderness

## 2015-11-02 LAB — GC/CHLAMYDIA PROBE AMP (~~LOC~~) NOT AT ARMC
Chlamydia: NEGATIVE
Neisseria Gonorrhea: NEGATIVE

## 2015-11-22 ENCOUNTER — Inpatient Hospital Stay (HOSPITAL_COMMUNITY)
Admission: AD | Admit: 2015-11-22 | Discharge: 2015-11-22 | Disposition: A | Discharge status: Home / Self Care | Payer: Medicaid Other | Source: Ambulatory Visit | Attending: Family Medicine | Admitting: Family Medicine

## 2015-11-22 ENCOUNTER — Encounter (HOSPITAL_COMMUNITY): Payer: Self-pay | Admitting: *Deleted

## 2015-11-22 ENCOUNTER — Encounter (HOSPITAL_COMMUNITY): Payer: Self-pay | Admitting: Family Medicine

## 2015-11-22 ENCOUNTER — Emergency Department (HOSPITAL_COMMUNITY)
Admission: EM | Admit: 2015-11-22 | Discharge: 2015-11-22 | Disposition: A | Discharge status: Home / Self Care | Payer: Medicaid Other | Source: Home / Self Care

## 2015-11-22 DIAGNOSIS — G5600 Carpal tunnel syndrome, unspecified upper limb: Secondary | ICD-10-CM | POA: Insufficient documentation

## 2015-11-22 DIAGNOSIS — M25512 Pain in left shoulder: Secondary | ICD-10-CM | POA: Insufficient documentation

## 2015-11-22 DIAGNOSIS — B009 Herpesviral infection, unspecified: Secondary | ICD-10-CM | POA: Insufficient documentation

## 2015-11-22 DIAGNOSIS — F419 Anxiety disorder, unspecified: Secondary | ICD-10-CM | POA: Diagnosis not present

## 2015-11-22 DIAGNOSIS — E079 Disorder of thyroid, unspecified: Secondary | ICD-10-CM | POA: Diagnosis not present

## 2015-11-22 DIAGNOSIS — R200 Anesthesia of skin: Secondary | ICD-10-CM | POA: Diagnosis present

## 2015-11-22 DIAGNOSIS — M791 Myalgia: Secondary | ICD-10-CM | POA: Insufficient documentation

## 2015-11-22 DIAGNOSIS — N939 Abnormal uterine and vaginal bleeding, unspecified: Secondary | ICD-10-CM | POA: Insufficient documentation

## 2015-11-22 DIAGNOSIS — Z87891 Personal history of nicotine dependence: Secondary | ICD-10-CM | POA: Insufficient documentation

## 2015-11-22 DIAGNOSIS — K589 Irritable bowel syndrome without diarrhea: Secondary | ICD-10-CM | POA: Diagnosis not present

## 2015-11-22 DIAGNOSIS — R109 Unspecified abdominal pain: Secondary | ICD-10-CM | POA: Diagnosis present

## 2015-11-22 DIAGNOSIS — K219 Gastro-esophageal reflux disease without esophagitis: Secondary | ICD-10-CM | POA: Diagnosis not present

## 2015-11-22 DIAGNOSIS — N898 Other specified noninflammatory disorders of vagina: Secondary | ICD-10-CM | POA: Insufficient documentation

## 2015-11-22 DIAGNOSIS — M7918 Myalgia, other site: Secondary | ICD-10-CM

## 2015-11-22 LAB — WET PREP, GENITAL
CLUE CELLS WET PREP: NONE SEEN
SPERM: NONE SEEN
TRICH WET PREP: NONE SEEN
Yeast Wet Prep HPF POC: NONE SEEN

## 2015-11-22 LAB — CBC
HCT: 35 % — ABNORMAL LOW (ref 36.0–46.0)
Hemoglobin: 11.1 g/dL — ABNORMAL LOW (ref 12.0–15.0)
MCH: 23.2 pg — ABNORMAL LOW (ref 26.0–34.0)
MCHC: 31.7 g/dL (ref 30.0–36.0)
MCV: 73.2 fL — AB (ref 78.0–100.0)
PLATELETS: 332 10*3/uL (ref 150–400)
RBC: 4.78 MIL/uL (ref 3.87–5.11)
RDW: 17.8 % — AB (ref 11.5–15.5)
WBC: 9.6 10*3/uL (ref 4.0–10.5)

## 2015-11-22 LAB — URINALYSIS, ROUTINE W REFLEX MICROSCOPIC
Bilirubin Urine: NEGATIVE
GLUCOSE, UA: NEGATIVE mg/dL
KETONES UR: NEGATIVE mg/dL
Leukocytes, UA: NEGATIVE
Nitrite: NEGATIVE
Protein, ur: 30 mg/dL — AB
Specific Gravity, Urine: 1.02 (ref 1.005–1.030)
pH: 6 (ref 5.0–8.0)

## 2015-11-22 LAB — URINE MICROSCOPIC-ADD ON: WBC, UA: NONE SEEN WBC/hpf (ref 0–5)

## 2015-11-22 LAB — POCT PREGNANCY, URINE: Preg Test, Ur: NEGATIVE

## 2015-11-22 MED ORDER — IBUPROFEN 600 MG PO TABS: 600.0000 mg | ORAL_TABLET | Freq: Four times a day (QID) | ORAL | Status: DC | PRN

## 2015-11-22 MED ORDER — KETOROLAC TROMETHAMINE 60 MG/2ML IM SOLN
60.0000 mg | Freq: Once | INTRAMUSCULAR | Status: AC
  Administered 2015-11-22: 60 mg via INTRAMUSCULAR
  Filled 2015-11-22: qty 2

## 2015-11-22 NOTE — Discharge Instructions (Signed)
Musculoskeletal Pain Musculoskeletal pain is muscle and boney aches and pains. These pains can occur in any part of the body. Your caregiver may treat you without knowing the cause of the pain. They may treat you if blood or urine tests, X-rays, and other tests were normal.  CAUSES There is often not a definite cause or reason for these pains. These pains may be caused by a type of germ (virus). The discomfort may also come from overuse. Overuse includes working out too hard when your body is not fit. Boney aches also come from weather changes. Bone is sensitive to atmospheric pressure changes. HOME CARE INSTRUCTIONS   Ask when your test results will be ready. Make sure you get your test results.  Only take over-the-counter or prescription medicines for pain, discomfort, or fever as directed by your caregiver. If you were given medications for your condition, do not drive, operate machinery or power tools, or sign legal documents for 24 hours. Do not drink alcohol. Do not take sleeping pills or other medications that may interfere with treatment.  Continue all activities unless the activities cause more pain. When the pain lessens, slowly resume normal activities. Gradually increase the intensity and duration of the activities or exercise.  During periods of severe pain, bed rest may be helpful. Lay or sit in any position that is comfortable.  Putting ice on the injured area.  Put ice in a bag.  Place a towel between your skin and the bag.  Leave the ice on for 15 to 20 minutes, 3 to 4 times a day.  Follow up with your caregiver for continued problems and no reason can be found for the pain. If the pain becomes worse or does not go away, it may be necessary to repeat tests or do additional testing. Your caregiver may need to look further for a possible cause. SEEK IMMEDIATE MEDICAL CARE IF:  You have pain that is getting worse and is not relieved by medications.  You develop chest pain  that is associated with shortness or breath, sweating, feeling sick to your stomach (nauseous), or throw up (vomit).  Your pain becomes localized to the abdomen.  You develop any new symptoms that seem different or that concern you. MAKE SURE YOU:   Understand these instructions.  Will watch your condition.  Will get help right away if you are not doing well or get worse.   This information is not intended to replace advice given to you by your health care provider. Make sure you discuss any questions you have with your health care provider.   Document Released: 09/17/2005 Document Revised: 12/10/2011 Document Reviewed: 05/22/2013 Elsevier Interactive Patient Education 2016 Else[employer] Inc[employer]  Abnormal Uterine Bleeding Abnormal uterine bleeding can affect women at various stages in life, including teenagers, women in their reproductive years, pregnant women, and women who have reached menopause. Several kinds of uterine bleeding are considered abnormal, including:  Bleeding or spotting between periods.   Bleeding after sexual intercourse.   Bleeding that is heavier or more than normal.   Periods that last longer than usual.  Bleeding after menopause.  Many cases of abnormal uterine bleeding are minor and simple to treat, while others are more serious. Any type of abnormal bleeding should be evaluated by your health care provider. Treatment will depend on the cause of the bleeding. HOME CARE INSTRUCTIONS Monitor your condition for any changes. The following actions may help to alleviate any discomfort you are experiencing:  Avoid the use  of tampons and douches as directed by your health care provider.  Change your pads frequently. You should get regular pelvic exams and Pap tests. Keep all follow-up appointments for diagnostic tests as directed by your health care provider.  SEEK MEDICAL CARE IF:   Your bleeding lasts more than 1 week.   You feel dizzy at times.  SEEK  IMMEDIATE MEDICAL CARE IF:   You pass out.   You are changing pads every 15 to 30 minutes.   You have abdominal pain.  You have a fever.   You become sweaty or weak.   You are passing large blood clots from the vagina.   You start to feel nauseous and vomit. MAKE SURE YOU:   Understand these instructions.  Will watch your condition.  Will get help right away if you are not doing well or get worse.   This information is not intended to replace advice given to you by your health care provider. Make sure you discuss any questions you have with your health care provider.   Document Released: 09/17/2005 Document Revised: 09/22/2013 Document Reviewed: 04/16/2013 Elsevier Interactive Patient Education [employer]

## 2015-11-22 NOTE — MAU Note (Signed)
Pt C/O pain in L shoulder that is starting radiating into her R - started 2 days ago.  Also C/O lower abd cramping, spotting since Friday, dark brown.  Also c/O numbness in hands & feet.

## 2015-11-22 NOTE — ED Notes (Addendum)
Patient is from home and transported via [facility]. Complaining of brown discharge with abd pain for one day. Also, pt complains of left shoulder pain. Pt ambulated into facility.
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
## 2015-11-22 NOTE — ED Notes (Signed)
Pt left the [facility] waiting room stating overheard by registration asking other visitors for transportation home. Pt is nowhere in sight on property.

## 2015-11-22 NOTE — Progress Notes (Signed)
Written and verbal d/c instructions given and understanding voiced. Pt states does not have any questions regarding d/c

## 2015-11-22 NOTE — MAU Note (Addendum)
Numbness in hands and feet for 3 or more wks. Shoulder pain last couple days. Gets "charie horse" in legs on occ. Lower abd pain since Friday

## 2015-11-23 LAB — GC/CHLAMYDIA PROBE AMP (~~LOC~~) NOT AT ARMC
Chlamydia: NEGATIVE
NEISSERIA GONORRHEA: NEGATIVE

## 2015-11-23 LAB — HIV ANTIBODY (ROUTINE TESTING W REFLEX): HIV SCREEN 4TH GENERATION: NONREACTIVE

## 2015-11-23 LAB — RPR: RPR Ser Ql: NONREACTIVE

## 2015-12-26 ENCOUNTER — Ambulatory Visit: Payer: Medicaid Other | Admitting: Internal Medicine

## 2016-01-24 ENCOUNTER — Emergency Department (HOSPITAL_COMMUNITY)
Admission: EM | Admit: 2016-01-24 | Discharge: 2016-01-24 | Disposition: A | Discharge status: Home / Self Care | Payer: Medicaid Other | Attending: Emergency Medicine | Admitting: Emergency Medicine

## 2016-01-24 ENCOUNTER — Encounter (HOSPITAL_COMMUNITY): Payer: Self-pay | Admitting: *Deleted

## 2016-01-24 DIAGNOSIS — J069 Acute upper respiratory infection, unspecified: Secondary | ICD-10-CM | POA: Diagnosis not present

## 2016-01-24 DIAGNOSIS — Y9289 Other specified places as the place of occurrence of the external cause: Secondary | ICD-10-CM | POA: Insufficient documentation

## 2016-01-24 DIAGNOSIS — R059 Cough, unspecified: Secondary | ICD-10-CM

## 2016-01-24 DIAGNOSIS — Z8632 Personal history of gestational diabetes: Secondary | ICD-10-CM | POA: Diagnosis not present

## 2016-01-24 DIAGNOSIS — S80861A Insect bite (nonvenomous), right lower leg, initial encounter: Secondary | ICD-10-CM | POA: Diagnosis not present

## 2016-01-24 DIAGNOSIS — Z862 Personal history of diseases of the blood and blood-forming organs and certain disorders involving the immune mechanism: Secondary | ICD-10-CM | POA: Insufficient documentation

## 2016-01-24 DIAGNOSIS — J029 Acute pharyngitis, unspecified: Secondary | ICD-10-CM | POA: Diagnosis present

## 2016-01-24 DIAGNOSIS — Z8639 Personal history of other endocrine, nutritional and metabolic disease: Secondary | ICD-10-CM | POA: Insufficient documentation

## 2016-01-24 DIAGNOSIS — Z79899 Other long term (current) drug therapy: Secondary | ICD-10-CM | POA: Diagnosis not present

## 2016-01-24 DIAGNOSIS — S80862A Insect bite (nonvenomous), left lower leg, initial encounter: Secondary | ICD-10-CM | POA: Insufficient documentation

## 2016-01-24 DIAGNOSIS — Y998 Other external cause status: Secondary | ICD-10-CM | POA: Insufficient documentation

## 2016-01-24 DIAGNOSIS — Y9389 Activity, other specified: Secondary | ICD-10-CM | POA: Insufficient documentation

## 2016-01-24 DIAGNOSIS — W57XXXA Bitten or stung by nonvenomous insect and other nonvenomous arthropods, initial encounter: Secondary | ICD-10-CM | POA: Diagnosis not present

## 2016-01-24 DIAGNOSIS — Z8619 Personal history of other infectious and parasitic diseases: Secondary | ICD-10-CM | POA: Diagnosis not present

## 2016-01-24 DIAGNOSIS — Z87891 Personal history of nicotine dependence: Secondary | ICD-10-CM | POA: Diagnosis not present

## 2016-01-24 DIAGNOSIS — R05 Cough: Secondary | ICD-10-CM

## 2016-01-24 MED ORDER — GUAIFENESIN-CODEINE 100-10 MG/5ML PO SOLN: 5.0000 mL | Freq: Three times a day (TID) | ORAL | Status: DC | PRN

## 2016-01-24 MED ORDER — IBUPROFEN 800 MG PO TABS
800.0000 mg | ORAL_TABLET | Freq: Once | ORAL | Status: AC
  Administered 2016-01-24: 800 mg via ORAL
  Filled 2016-01-24: qty 1

## 2016-01-24 MED ORDER — DIPHENHYDRAMINE HCL 25 MG PO CAPS
25.0000 mg | ORAL_CAPSULE | Freq: Once | ORAL | Status: AC
  Administered 2016-01-24: 25 mg via ORAL
  Filled 2016-01-24: qty 1

## 2016-01-24 MED ORDER — PREDNISONE 20 MG PO TABS
60.0000 mg | ORAL_TABLET | Freq: Once | ORAL | Status: AC
  Administered 2016-01-24: 60 mg via ORAL
  Filled 2016-01-24: qty 3

## 2016-01-24 MED ORDER — METOCLOPRAMIDE HCL 10 MG PO TABS
10.0000 mg | ORAL_TABLET | Freq: Once | ORAL | Status: DC
  Filled 2016-01-24 (×2): qty 1

## 2016-01-24 MED ORDER — PREDNISONE 20 MG PO TABS: 40.0000 mg | ORAL_TABLET | Freq: Every day | ORAL | Status: DC

## 2016-01-24 MED ORDER — DIPHENHYDRAMINE HCL 25 MG PO TABS: 25.0000 mg | ORAL_TABLET | Freq: Four times a day (QID) | ORAL | Status: DC | PRN

## 2016-01-24 NOTE — ED Notes (Signed)
No answer

## 2016-01-24 NOTE — ED Notes (Signed)
No answer x 3 attempts... Will move patient back to waiting room and reattempt

## 2016-01-24 NOTE — Discharge Instructions (Signed)
Insect Bite Mosquitoes, flies, fleas, bedbugs, and many other insects can bite. Insect bites are different from insect stings. A sting is when poison (venom) is injected into the skin. Insect bites can cause pain or itching for a few days, but they are usually not serious. Some insects can spread diseases to people through a bite. SYMPTOMS  Symptoms of an insect bite include:  Itching or pain in the bite area.  Redness and swelling in the bite area.  An open wound (skin ulcer). In many cases, symptoms last for 2-4 days.  DIAGNOSIS  This condition is usually diagnosed based on symptoms and a physical exam. TREATMENT  Treatment is usually not needed for an insect bite. Symptoms often go away on their own. Your health care provider may recommend creams or lotions to help reduce itching. Antibiotic medicines may be prescribed if the bite becomes infected. A tetanus shot may be given in some cases. If you develop an allergic reaction to an insect bite, your health care provider will prescribe medicines to treat the reaction (antihistamines). This is rare. HOME CARE INSTRUCTIONS  Do not scratch the bite area.  Keep the bite area clean and dry. Wash the bite area daily with soap and water as told by your health care provider.  If directed, applyice to the bite area.  Put ice in a plastic bag.  Place a towel between your skin and the bag.  Leave the ice on for 20 minutes, 2-3 times per day.  To help reduce itching and swelling, try applying a baking soda paste, cortisone cream, or calamine lotion to the bite area as told by your health care provider.  Apply or take over-the-counter and prescription medicines only as told by your health care provider.  If you were prescribed an antibiotic medicine, use it as told by your health care provider. Do not stop using the antibiotic even if your condition improves.  Keep all follow-up visits as told by your health care provider. This is  important. PREVENTION   Use insect repellent. The best insect repellents contain:  DEET, picaridin, oil of lemon eucalyptus (OLE), or IR3535.  Higher amounts of an active ingredient.  When you are outdoors, wear clothing that covers your arms and legs.  Avoid opening windows that do not have window screens. SEEK MEDICAL CARE IF:  You have increased redness, swelling, or pain in the bite area.  You have a fever. SEEK IMMEDIATE MEDICAL CARE IF:   You have joint pain.   You have fluid, blood, or pus coming from the bite area.  You have a headache or neck pain.  You have unusual weakness.  You have a rash.  You have chest pain or shortness of breath.  You have abdominal pain, nausea, or vomiting.  You feel unusually tired or sleepy.   This information is not intended to replace advice given to you by your health care provider. Make sure you discuss any questions you have with your health care provider.   Document Released: 10/25/2004 Document Revised: 06/08/2015 Document Reviewed: 02/02/2015 Elsevier Interactive Patient Education 2016 Elsevier Inc.   Pharyngitis Pharyngitis is redness, pain, and swelling (inflammation) of your pharynx.  CAUSES  Pharyngitis is usually caused by infection. Most of the time, these infections are from viruses (viral) and are part of a cold. However, sometimes pharyngitis is caused by bacteria (bacterial). Pharyngitis can also be caused by allergies. Viral pharyngitis may be spread from person to person by coughing, sneezing, and personal items  or utensils (cups, forks, spoons, toothbrushes). Bacterial pharyngitis may be spread from person to person by more intimate contact, such as kissing.  SIGNS AND SYMPTOMS  Symptoms of pharyngitis include:   Sore throat.   Tiredness (fatigue).   Low-grade fever.   Headache.  Joint pain and muscle aches.  Skin rashes.  Swollen lymph nodes.  Plaque-like film on throat or tonsils (often  seen with bacterial pharyngitis). DIAGNOSIS  Your health care provider will ask you questions about your illness and your symptoms. Your medical history, along with a physical exam, is often all that is needed to diagnose pharyngitis. Sometimes, a rapid strep test is done. Other lab tests may also be done, depending on the suspected cause.  TREATMENT  Viral pharyngitis will usually get better in 3-4 days without the use of medicine. Bacterial pharyngitis is treated with medicines that kill germs (antibiotics).  HOME CARE INSTRUCTIONS   Drink enough water and fluids to keep your urine clear or pale yellow.   Only take over-the-counter or prescription medicines as directed by your health care provider:   If you are prescribed antibiotics, make sure you finish them even if you start to feel better.   Do not take aspirin.   Get lots of rest.   Gargle with 8 oz of salt water ( tsp of salt per 1 qt of water) as often as every 1-2 hours to soothe your throat.   Throat lozenges (if you are not at risk for choking) or sprays may be used to soothe your throat. SEEK MEDICAL CARE IF:   You have large, tender lumps in your neck.  You have a rash.  You cough up green, yellow-brown, or bloody spit. SEEK IMMEDIATE MEDICAL CARE IF:   Your neck becomes stiff.  You drool or are unable to swallow liquids.  You vomit or are unable to keep medicines or liquids down.  You have severe pain that does not go away with the use of recommended medicines.  You have trouble breathing (not caused by a stuffy nose). MAKE SURE YOU:   Understand these instructions.  Will watch your condition.  Will get help right away if you are not doing well or get worse.   This information is not intended to replace advice given to you by your health care provider. Make sure you discuss any questions you have with your health care provider.   Document Released: 09/17/2005 Document Revised: 07/08/2013 Document  Reviewed: 05/25/2013 Elsevier Interactive Patient Education 2016 Elsevier Inc.  Upper Respiratory Infection, Adult Most upper respiratory infections (URIs) are a viral infection of the air passages leading to the lungs. A URI affects the nose, throat, and upper air passages. The most common type of URI is nasopharyngitis and is typically referred to as "the common cold." URIs run their course and usually go away on their own. Most of the time, a URI does not require medical attention, but sometimes a bacterial infection in the upper airways can follow a viral infection. This is called a secondary infection. Sinus and middle ear infections are common types of secondary upper respiratory infections. Bacterial pneumonia can also complicate a URI. A URI can worsen asthma and chronic obstructive pulmonary disease (COPD). Sometimes, these complications can require emergency medical care and may be life threatening.  CAUSES Almost all URIs are caused by viruses. A virus is a type of germ and can spread from one person to another.  RISKS FACTORS You may be at risk for a URI  if:   You smoke.   You have chronic heart or lung disease.  You have a weakened defense (immune) system.   You are very young or very old.   You have nasal allergies or asthma.  You work in crowded or poorly ventilated areas.  You work in health care facilities or schools. SIGNS AND SYMPTOMS  Symptoms typically develop 2-3 days after you come in contact with a cold virus. Most viral URIs last 7-10 days. However, viral URIs from the influenza virus (flu virus) can last 14-18 days and are typically more severe. Symptoms may include:   Runny or stuffy (congested) nose.   Sneezing.   Cough.   Sore throat.   Headache.   Fatigue.   Fever.   Loss of appetite.   Pain in your forehead, behind your eyes, and over your cheekbones (sinus pain).  Muscle aches.  DIAGNOSIS  Your health care provider may  diagnose a URI by:  Physical exam.  Tests to check that your symptoms are not due to another condition such as:  Strep throat.  Sinusitis.  Pneumonia.  Asthma. TREATMENT  A URI goes away on its own with time. It cannot be cured with medicines, but medicines may be prescribed or recommended to relieve symptoms. Medicines may help:  Reduce your fever.  Reduce your cough.  Relieve nasal congestion. HOME CARE INSTRUCTIONS   Take medicines only as directed by your health care provider.   Gargle warm saltwater or take cough drops to comfort your throat as directed by your health care provider.  Use a warm mist humidifier or inhale steam from a shower to increase air moisture. This may make it easier to breathe.  Drink enough fluid to keep your urine clear or pale yellow.   Eat soups and other clear broths and maintain good nutrition.   Rest as needed.   Return to work when your temperature has returned to normal or as your health care provider advises. You may need to stay home longer to avoid infecting others. You can also use a face mask and careful hand washing to prevent spread of the virus.  Increase the usage of your inhaler if you have asthma.   Do not use any tobacco products, including cigarettes, chewing tobacco, or electronic cigarettes. If you need help quitting, ask your health care provider. PREVENTION  The best way to protect yourself from getting a cold is to practice good hygiene.   Avoid oral or hand contact with people with cold symptoms.   Wash your hands often if contact occurs.  There is no clear evidence that vitamin C, vitamin E, echinacea, or exercise reduces the chance of developing a cold. However, it is always recommended to get plenty of rest, exercise, and practice good nutrition.  SEEK MEDICAL CARE IF:   You are getting worse rather than better.   Your symptoms are not controlled by medicine.   You have chills.  You have  worsening shortness of breath.  You have brown or red mucus.  You have yellow or brown nasal discharge.  You have pain in your face, especially when you bend forward.  You have a fever.  You have swollen neck glands.  You have pain while swallowing.  You have white areas in the back of your throat. SEEK IMMEDIATE MEDICAL CARE IF:   You have severe or persistent:  Headache.  Ear pain.  Sinus pain.  Chest pain.  You have chronic lung disease and any of  the following:  Wheezing.  Prolonged cough.  Coughing up blood.  A change in your usual mucus.  You have a stiff neck.  You have changes in your:  Vision.  Hearing.  Thinking.  Mood. MAKE SURE YOU:   Understand these instructions.  Will watch your condition.  Will get help right away if you are not doing well or get worse.   This information is not intended to replace advice given to you by your health care provider. Make sure you discuss any questions you have with your health care provider.   Document Released: 03/13/2001 Document Revised: 02/01/2015 Document Reviewed: 12/23/2013 Elsevier Interactive Patient Education 2016 Elsevier Inc.   Cough, Adult Coughing is a reflex that clears your throat and your airways. Coughing helps to heal and protect your lungs. It is normal to cough occasionally, but a cough that happens with other symptoms or lasts a long time may be a sign of a condition that needs treatment. A cough may last only 2-3 weeks (acute), or it may last longer than 8 weeks (chronic). CAUSES Coughing is commonly caused by:  Breathing in substances that irritate your lungs.  A viral or bacterial respiratory infection.  Allergies.  Asthma.  Postnasal drip.  Smoking.  Acid backing up from the stomach into the esophagus (gastroesophageal reflux).  Certain medicines.  Chronic lung problems, including COPD (or rarely, lung cancer).  Other medical conditions such as heart  failure. HOME CARE INSTRUCTIONS  Pay attention to any changes in your symptoms. Take these actions to help with your discomfort:  Take medicines only as told by your health care provider.  If you were prescribed an antibiotic medicine, take it as told by your health care provider. Do not stop taking the antibiotic even if you start to feel better.  Talk with your health care provider before you take a cough suppressant medicine.  Drink enough fluid to keep your urine clear or pale yellow.  If the air is dry, use a cold steam vaporizer or humidifier in your bedroom or your home to help loosen secretions.  Avoid anything that causes you to cough at work or at home.  If your cough is worse at night, try sleeping in a semi-upright position.  Avoid cigarette smoke. If you smoke, quit smoking. If you need help quitting, ask your health care provider.  Avoid caffeine.  Avoid alcohol.  Rest as needed. SEEK MEDICAL CARE IF:   You have new symptoms.  You cough up pus.  Your cough does not get better after 2-3 weeks, or your cough gets worse.  You cannot control your cough with suppressant medicines and you are losing sleep.  You develop pain that is getting worse or pain that is not controlled with pain medicines.  You have a fever.  You have unexplained weight loss.  You have night sweats. SEEK IMMEDIATE MEDICAL CARE IF:  You cough up blood.  You have difficulty breathing.  Your heartbeat is very fast.   This information is not intended to replace advice given to you by your health care provider. Make sure you discuss any questions you have with your health care provider.   Document Released: 03/16/2011 Document Revised: 06/08/2015 Document Reviewed: 11/24/2014 Elsevier Interactive Patient Education [employer]
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
## 2016-01-24 NOTE — ED Notes (Signed)
Pt woke up with a sore throat yesterday morning.  She is c/o vomiting - vomit x 3 today.  She also has a rash on her lower legs - with bite marks from a bug pt has has pictures of.  No fevers.

## 2016-01-24 NOTE — ED Notes (Signed)
Per Selena BattenKim, RN ([facility]) [facility] to be seen also

## 2016-02-05 ENCOUNTER — Emergency Department (HOSPITAL_COMMUNITY)
Admission: EM | Admit: 2016-02-05 | Discharge: 2016-02-05 | Disposition: A | Discharge status: Home / Self Care | Payer: Medicaid Other | Attending: Emergency Medicine | Admitting: Emergency Medicine

## 2016-02-05 ENCOUNTER — Encounter (HOSPITAL_COMMUNITY): Payer: Self-pay | Admitting: Emergency Medicine

## 2016-02-05 DIAGNOSIS — Z87891 Personal history of nicotine dependence: Secondary | ICD-10-CM | POA: Insufficient documentation

## 2016-02-05 DIAGNOSIS — Z791 Long term (current) use of non-steroidal anti-inflammatories (NSAID): Secondary | ICD-10-CM | POA: Insufficient documentation

## 2016-02-05 DIAGNOSIS — J029 Acute pharyngitis, unspecified: Secondary | ICD-10-CM | POA: Diagnosis present

## 2016-02-05 DIAGNOSIS — J020 Streptococcal pharyngitis: Secondary | ICD-10-CM | POA: Diagnosis not present

## 2016-02-05 DIAGNOSIS — Z7952 Long term (current) use of systemic steroids: Secondary | ICD-10-CM | POA: Diagnosis not present

## 2016-02-05 DIAGNOSIS — Z79899 Other long term (current) drug therapy: Secondary | ICD-10-CM | POA: Insufficient documentation

## 2016-02-05 DIAGNOSIS — K219 Gastro-esophageal reflux disease without esophagitis: Secondary | ICD-10-CM | POA: Insufficient documentation

## 2016-02-05 DIAGNOSIS — K589 Irritable bowel syndrome without diarrhea: Secondary | ICD-10-CM | POA: Diagnosis not present

## 2016-02-05 LAB — RAPID STREP SCREEN (MED CTR MEBANE ONLY): Streptococcus, Group A Screen (Direct): POSITIVE — AB

## 2016-02-05 MED ORDER — PENICILLIN G BENZATHINE & PROC 900000-300000 UNIT/2ML IM SUSP
1.2000 10*6.[IU] | Freq: Once | INTRAMUSCULAR | Status: AC
  Administered 2016-02-05: 1.2 10*6.[IU] via INTRAMUSCULAR
  Filled 2016-02-05: qty 2

## 2016-02-05 MED ORDER — KETOROLAC TROMETHAMINE 30 MG/ML IJ SOLN
60.0000 mg | Freq: Once | INTRAMUSCULAR | Status: AC
  Administered 2016-02-05: 60 mg via INTRAMUSCULAR
  Filled 2016-02-05: qty 2

## 2016-02-05 MED ORDER — PENICILLIN G BENZATHINE & PROC 1200000 UNIT/2ML IM SUSP
1.2000 10*6.[IU] | Freq: Once | INTRAMUSCULAR | Status: DC
  Filled 2016-02-05: qty 2

## 2016-02-05 NOTE — ED Notes (Addendum)
Pt c/o sore throat, "it feels like my throat is swelling," x 2 weeks. Physical appearance makes it difficult to differentiate obvious swelling. Able to speak and maintain salivary secretions without difficulty. Says this has been on-going x 2 weeks and [facility] without [facility]. Patient states she's worried she has an STD in her throat.

## 2016-02-05 NOTE — Progress Notes (Signed)
Pt states her throat has been swollen times 2 weeks. Pt states the steriods did not work. Pt does not appear in distress.

## 2016-02-05 NOTE — ED Provider Notes (Signed)
CSN: [id number]     Arrival date & time 02/05/16  1454 History  By signing my name below, I, Melinda Jimenez, attest that this documentation has been prepared under the direction and in the presence of  Melinda MeresAshley Meyer, PA-C. Electronically Signed: Doreatha MartinEva Jimenez, ED Scribe. 02/05/2016. 4:04 PM.     Chief Complaint  Patient presents with  . Sore Throat   The history is provided by the patient. No language interpreter was used.   HPI Comments: Melinda Jimenez is a 30 y.o. female with h/o thyroid disease (non-medicated) who presents to the Emergency Department complaining of moderate sore throat onset 2 weeks ago with associated sensation of throat swelling, bilateral ear pain, productive cough with clear phlegm, post-tussive emesis, chills, HA. She also complains of mild difficulty breathing secondary to pain, which is worsened at night. Pt also complains of chest wall tenderness secondary to cough and emesis. Pt states she was seen in the ED for the same complaints 2 weeks ago and given rx for steroids, codeine with no relief. Pt reports adequate food and fluid intake. No known sick contacts with similar symptoms. Denies fever, SOB, rhinorrhea, nasal congestion, eye discharge, ear discharge, wheezing, difficulty tolerating secretions or swallowing, leg pain or swelling. No Hx of PE/DVT, recent long travel, surgery, fracture, prolonged immobilization, hormone use. NKDA.   Past Medical History  Diagnosis Date  . Genital HSV     Last OB: > 163yr ago; Valtrex 500 mg po prn outbreaks  . Thyroid disease   . Mood swings (HCC)   . Headache(784.0)   . Chlamydia   . Trichomonas   . Anemia   . Gestational diabetes     diet controlled  . Infection     UTI  . Anxiety     off meds with preg  . Panic attack   . Carpal tunnel syndrome   . GERD (gastroesophageal reflux disease)   . IBS (irritable bowel syndrome)    Past Surgical History  Procedure Laterality Date  . Induced abortion  2009  . Vaginal delivery   2007  . Cesarean section N/A 05/20/2013    Procedure: CESAREAN SECTION;  Surgeon: [name], MD;  Location: [facility];  Service: Obstetrics;  Laterality: N/A;   Family History  Problem Relation Age of Onset  . Breast cancer Other   . Depression Mother   . Anxiety disorder Mother   . Bipolar disorder Brother   . Diabetes Maternal Grandmother   . Cancer Maternal Grandmother   . Hypertension Maternal Grandmother   . Hypertension Paternal Grandmother   . Autism    . Schizophrenia     Social History  Substance Use Topics  . Smoking status: Former Games developermoker  . Smokeless tobacco: Never Used     Comment: was not a daily smoker, tried it  . Alcohol Use: Yes     Comment: Twice a month   OB History    Gravida Para Term Preterm AB TAB SAB Ectopic Multiple Living   8 3 3  5 3 2   2      Review of Systems  Constitutional: Positive for chills. Negative for fever and diaphoresis.  HENT: Positive for ear pain and sore throat. Negative for congestion, ear discharge, rhinorrhea, sinus pressure and trouble swallowing.   Eyes: Negative for discharge.  Respiratory: Positive for cough. Negative for shortness of breath and wheezing.   Cardiovascular: Negative for leg swelling.  Gastrointestinal: Positive for vomiting (post-tussive). Negative for nausea.  Musculoskeletal:  Negative for myalgias and arthralgias.  Neurological: Positive for headaches. Negative for dizziness, light-headedness and numbness.   Allergies  Zofran  Home Medications   Prior to Admission medications   Medication Sig Start Date End Date Taking? Authorizing Provider  ALPRAZolam Prudy Feeler) 1 MG tablet Take 0.5 mg by mouth 3 (three) times daily as needed for anxiety.     Historical Provider, MD  diphenhydrAMINE (BENADRYL) 25 MG tablet Take 1 tablet (25 mg total) by mouth every 6 (six) hours as needed for itching. 01/24/16   Danelle Berry, PA-C  divalproex (DEPAKOTE) 250 MG DR tablet Take 250 mg by mouth 2 (two) times daily.     Historical Provider, MD  gabapentin (NEURONTIN) 300 MG capsule Take 300 mg by mouth 2 (two) times daily.    Historical Provider, MD  guaiFENesin-codeine 100-10 MG/5ML syrup Take 5 mLs by mouth 3 (three) times daily as needed for cough. 01/24/16   Danelle Berry, PA-C  ibuprofen (ADVIL,MOTRIN) 600 MG tablet Take 1 tablet (600 mg total) by mouth every 6 (six) hours as needed. 11/22/15   Misty Stanley A Leftwich-Kirby, CNM  predniSONE (DELTASONE) 20 MG tablet Take 2 tablets (40 mg total) by mouth daily. Take 40 mg by mouth daily for 3 days, then 20mg  by mouth daily for 3 days, then 10mg  daily for 3 days 01/24/16   Danelle Berry, PA-C  valACYclovir (VALTREX) 1000 MG tablet Take 500 mg by mouth daily. 11/11/15   Historical Provider, MD   BP 113/74 mmHg  Pulse 85  Temp(Src) 99 F (37.2 C) (Oral)  Resp 18  Ht 5\' 7"  (1.702 m)  Wt 108.863 kg  BMI 37.58 kg/m2  SpO2 100% Physical Exam  Constitutional: She appears well-developed and well-nourished. No distress.  HENT:  Head: Normocephalic and atraumatic.  Right Ear: Tympanic membrane normal.  Nose: Nose normal. Right sinus exhibits no maxillary sinus tenderness and no frontal sinus tenderness. Left sinus exhibits no maxillary sinus tenderness and no frontal sinus tenderness.  Mouth/Throat: Uvula is midline and mucous membranes are normal. No oral lesions. No trismus in the jaw. No uvula swelling. Posterior oropharyngeal erythema present. No oropharyngeal exudate, posterior oropharyngeal edema or tonsillar abscesses.  Bilateral tonsillar enlargement. No exudate. Mild erythema noted to bilateral tonsils. No evidence of peritonsillar abscess. No ludwig's angina. Able to tolerate secretions. Left TM visibility limited d/t ear wax. Right TM visualized - TM normal. Bilateral nasal turbinates pale and swollen.   Eyes: Conjunctivae and EOM are normal. Pupils are equal, round, and reactive to light. Right eye exhibits no discharge. Left eye exhibits no discharge. No scleral  icterus.  Neck: Normal range of motion. Neck supple. No tracheal deviation present.  Cardiovascular: Normal rate, regular rhythm and normal heart sounds.  Exam reveals no gallop and no friction rub.   No murmur heard. Pulmonary/Chest: Effort normal and breath sounds normal. No accessory muscle usage or stridor. No respiratory distress. She has no wheezes. She has no rales.  Lungs CTA bilaterally.   Abdominal: She exhibits no distension.  Musculoskeletal: Normal range of motion.  Lymphadenopathy:    She has no cervical adenopathy.  Neurological: She is alert. Coordination normal.  Skin: Skin is warm and dry. She is not diaphoretic.  Psychiatric: She has a normal mood and affect. Her behavior is normal.  Nursing note and vitals reviewed.   ED Course  Procedures (including critical care time) DIAGNOSTIC STUDIES: Oxygen Saturation is 95% on RA, adequate by my interpretation.    COORDINATION OF CARE: 4:01 PM  Discussed treatment plan with pt at bedside which includes rapid strep and pt agreed to plan.   Labs Review Labs Reviewed  RAPID STREP SCREEN (NOT AT [facility]) - Abnormal; Notable for the following:    Streptococcus, Group A Screen (Direct) POSITIVE (*)    All other components within normal limits    I have personally reviewed and evaluated these lab results as part of my medical decision-making.   MDM   Final diagnoses:  Strep pharyngitis   Patient presents to ED with complaint of sore throat x 2 weeks. Patient is afebrile and non-toxic. She is resting comfortably in examination chair. VSS.  No airway concern - No stridor, tachypnea, accessory muscle use; patient is tolerating oral secretions. No ludwig's angina. Mild posterior pharynx erythema with b/l tonsillar hypertrophy without exudate. No PTA. Lugs are CTABL. No wheezing. Well's Criteria 0. Rapid strep positive for group A streptococcus. Provided patient toradol and PCN in ED with symptomatic relief and treatment of strep  pharyngitis. Discussed result, findings, treatment, and follow up  with patient.  Pt given return precautions.  Pt verbalizes understanding and agrees with plan.       I personally performed the services described in this documentation, which was scribed in my presence. The recorded information has been reviewed and is accurate.   Lona Kettle, [place] 02/05/16 2353  Lorre Nick, MD 02/11/16 [phone]

## 2016-02-05 NOTE — Discharge Instructions (Signed)
Read the information below.  Use the prescribed medication as directed.  Please discuss all new medications with your pharmacist.  You may return to the Emergency Department at any time for worsening condition or any new symptoms that concern you. Be sure to establish PCP. If your symptoms worsen or you develop a fever, difficulty swallowing, difficulty breathing return to the ED. If your older child starts to have symptoms, follow up with pediatrician.   [employer]stateCommunity Resource Guide Financial Assistance The United Ways 211 is a great source of information about community services available.  Access by dialing 2-1-1 from anywhere in [place], or by website -  PooledIncome.plwww.nc211.org.   Other Local Resources (Updated 10/2015)  Financial Assistance   Services    Phone Number and Address  [facility]  Low-cost medical care - 1st and 3rd Saturday of every month  Must not qualify for public or private insurance and must have limited income [phone] [address] [place], [place]    [place] Department of Social Services  Child care  Emergency assistance for housing and Kimberly-Clarkutilities  Food stamps  Medicaid [phone] [address] [place], [place] [address]   [facility]  Low-cost medical care for children, communicable diseases, sexually-transmitted diseases, immunizations, maternity care, womens health and family planning [phone] [address] [place], [place] [address]  [facility]   Medication assistance for [facility] residents  Must meet income requirements [phone] [address] [place], [place].    [facility]  Child care  Emergency assistance for housing and Kimberly-Clarkutilities  Food stamps  Medicaid [phone] [address] [place], [place] [address]  [facility]   Low-cost medical care,   Monday  through Friday, 9 am to 6 pm.   Accepts Medicare/Medicaid, and self-pay [phone] [address] [place], [place] [address]  [facility]  Low-cost medical care - Monday through Friday, 8:30 am - 5:30 pm  Accepts Medicaid and self-pay [phone] [address] [place], [place] [address]   [facility]  Primary medical care, including for those with sickle cell disease  Accepts Medicare, Medicaid, insurance and self-pay [phone] [address] [place], [place]  [facility]   Primary medical care  Accepts Medicare, [place], insurance and self-pay [phone] Rivers[address], Jr[address] [place], [place] [address]   [facility] Department of Social Services  Child care  Emergency assistance for housing and Kimberly-Clarkutilities  Food stamps  Medicaid [phone] [address] [place], [place] [address]  [facility] of Health and [employer]Human Services  Child care  Emergency assistance for housing and Kimberly-Clarkutilities  Food stamps  Medicaid [phone] [address] [place], [place] [address]   [facility]  Medication assistance for [facility] residents with no insurance only  Must have a primary care doctor [phone] [address], [address] [place], [place]  [facility]   Primary medical care  [place] Medicare, [place], insurance  [phone] [address] [place], [place]  [facility]   Medication assistance [phone]  [facility]   Primary medical care  Accepts Medicare, [place], insurance and self-pay [phone] [place], [place] [address]  [facility]   Primary medical care  Accepts Medicare, [place], insurance and self-pay [phone] [address] [place], [place] [address]  [facility]  For [facility] residents between the  ages of 53 and 4 who do not have any form of health insurance, Medicare, [place], or [place] benefits.  Services are provided free of charge to uninsured patients who fall within federal poverty guidelines.    Hours: Tuesdays and Thursdays, 4:15 - 8 pm [phone] [address] [place], [place] [address]  [facility]     Primary medical care  Dental care  Nutritional counseling  Pharmacy  Accepts Medicaid, Medicare, most insurance.  Fees are adjusted based on ability to pay.   [phone] [facility] [address] [place], [place]  [phone] [facility] [address] [place], [place]  [phone] [facility] [place], [place]  [phone] [facility], [address] [place], [place]  [phone] [facility] [address] [place], [place]  [facility]  Womens health and family planning [phone] [address] [place], [place]  [facility] Department of Social Services  Child care  Emergency assistance for housing and [employer]  Medicaid [phone] [address], [place], [place] [address]   [facility]    Ages 78 and older  Hours: Mondays and Thursdays, 7:00 am - 9:00 am Patients are seen on a first come, first served basis. [phone], ext. [phone] [place], [place]  [facility] Division of Social Services  Child care  Emergency assistance for housing and [employer]  Medicaid [phone] [address] [place], [place] [address]  [facility] [employer]  Medication assistance  Rental assistance  Food pantry  Medication assistance  Housing assistance  Emergency food distribution  Utility assistance [phone] [address] [place], [place]  [phone]  [address] [place], [place] [address] Hours:  Tuesdays and Thursdays from 9am - 12 noon by appointment only  [phone] [address] [place], [place] [address]  [facility] Lanae Boast   Accepts private insurance, [place], and [place].  Payment is based on a sliding scale for those without insurance.  Hours: Mondays, Tuesdays and Thursdays, 8:30 am - 5:30 pm.   [phone] [address] [place], [place]  [facility] - Family Medicine at [facility], [place], and [place].  Payment is based on a sliding scale for those without insurance. [phone] [address] [place], [place]  [facility] - Pediatrics at E. Scientist, research (physical sciences), [employer], and [place].  Payment is based on a sliding scale for those without insurance [phone] [address], [employer], [place]  [facility] - Pediatrics at [employer], [place], and [place].  Payment is based on a sliding scale for those without insurance. [phone] [address] [place], [place]  [facility] - Pediatrics at [facility], [place], and [place].  Payment is based on a sliding scale for those without insurance. [phone], [phone] [place], [place].    [facility]  Maternity care.  Accepts Medicaid and self-pay. [phone] [address] [place], [place]      Strep Throat Strep throat is an infection of the throat. It is caused by germs. Strep throat spreads from person to person because of coughing, sneezing, or close contact. HOME CARE Medicines  Take over-the-counter and prescription medicines only  as told by your doctor.  Take your antibiotic medicine as told by your doctor. Do not stop taking the medicine even if you feel better.  Have family members who also have  a sore throat or fever go to a doctor. Eating and Drinking  Do not share food, drinking cups, or personal items.  Try eating soft foods until your sore throat feels better.  Drink enough fluid to keep your pee (urine) clear or pale yellow. General Instructions  Rinse your mouth (gargle) with a salt-water mixture 3-4 times per day or as needed. To make a salt-water mixture, stir -1 tsp of salt into 1 cup of warm water.  Make sure that all people in your house wash their hands well.  Rest.  Stay home from school or work until you have been taking antibiotics for 24 hours.  Keep all follow-up visits as told by your doctor. This is important. GET HELP IF:  Your neck keeps getting bigger.  You get a rash, cough, or earache.  You cough up thick liquid that is green, yellow-brown, or bloody.  You have pain that does not get better with medicine.  Your problems get worse instead of getting better.  You have a fever. GET HELP RIGHT AWAY IF:  You throw up (vomit).  You get a very bad headache.  You neck hurts or it feels stiff.  You have chest pain or you are short of breath.  You have drooling, very bad throat pain, or changes in your voice.  Your neck is swollen or the skin gets red and tender.  Your mouth is dry or you are peeing less than normal.  You keep feeling more tired or it is hard to wake up.  Your joints are red or they hurt.   This information is not intended to replace advice given to you by your health care provider. Make sure you discuss any questions you have with your health care provider.   Document Released: 03/05/2008 Document Revised: 06/08/2015 Document Reviewed: 01/10/2015 Elsevier Interactive Patient Education [employer]

## 2016-08-22 ENCOUNTER — Encounter (HOSPITAL_COMMUNITY): Payer: Self-pay | Admitting: Emergency Medicine

## 2016-08-22 ENCOUNTER — Ambulatory Visit (HOSPITAL_COMMUNITY)
Admission: EM | Admit: 2016-08-22 | Discharge: 2016-08-22 | Disposition: A | Discharge status: Home / Self Care | Payer: Medicaid Other | Attending: Emergency Medicine | Admitting: Emergency Medicine

## 2016-08-22 DIAGNOSIS — Z818 Family history of other mental and behavioral disorders: Secondary | ICD-10-CM | POA: Insufficient documentation

## 2016-08-22 DIAGNOSIS — B9689 Other specified bacterial agents as the cause of diseases classified elsewhere: Secondary | ICD-10-CM | POA: Insufficient documentation

## 2016-08-22 DIAGNOSIS — Z79899 Other long term (current) drug therapy: Secondary | ICD-10-CM | POA: Insufficient documentation

## 2016-08-22 DIAGNOSIS — Z888 Allergy status to other drugs, medicaments and biological substances status: Secondary | ICD-10-CM | POA: Diagnosis not present

## 2016-08-22 DIAGNOSIS — N760 Acute vaginitis: Secondary | ICD-10-CM | POA: Diagnosis not present

## 2016-08-22 DIAGNOSIS — N898 Other specified noninflammatory disorders of vagina: Secondary | ICD-10-CM | POA: Diagnosis present

## 2016-08-22 DIAGNOSIS — Z87891 Personal history of nicotine dependence: Secondary | ICD-10-CM | POA: Diagnosis not present

## 2016-08-22 DIAGNOSIS — Z3201 Encounter for pregnancy test, result positive: Secondary | ICD-10-CM | POA: Diagnosis not present

## 2016-08-22 LAB — POCT URINALYSIS DIP (DEVICE)
Glucose, UA: NEGATIVE mg/dL
KETONES UR: NEGATIVE mg/dL
LEUKOCYTES UA: NEGATIVE
Nitrite: NEGATIVE
PH: 6 (ref 5.0–8.0)
Protein, ur: 100 mg/dL — AB
SPECIFIC GRAVITY, URINE: 1.025 (ref 1.005–1.030)
Urobilinogen, UA: 1 mg/dL (ref 0.0–1.0)

## 2016-08-22 LAB — POCT PREGNANCY, URINE: Preg Test, Ur: POSITIVE — AB

## 2016-08-22 MED ORDER — METRONIDAZOLE 0.75 % VA GEL: 1.0000 | Freq: Two times a day (BID) | VAGINAL | 0 refills | Status: AC

## 2016-08-22 NOTE — ED Provider Notes (Signed)
[place]    CSN: [id number] Arrival date & time: 08/22/16  1805     History   Chief Complaint Chief Complaint  Patient presents with  . Vaginal Discharge    HPI Melinda Jimenez is a 30 y.o. female.   HPI  She is a 30 year old woman here for evaluation of vaginal discharge. She reports a white discharge for the last 3 days. There is associated odor. Denies itching. She has had a little bit of lower abdominal comfort. She also reports nausea and headaches.  Past Medical History:  Diagnosis Date  . Anemia   . Anxiety    off meds with preg  . Carpal tunnel syndrome   . Chlamydia   . Genital HSV    Last OB: > 71yr[age]; Valtrex 500 mg po prn outbreaks  . GERD (gastroesophageal reflux disease)   . Gestational diabetes    diet controlled  . Headache(784.0)   . IBS (irritable bowel syndrome)   . Infection    UTI  . Mood swings ([place]   . Panic attack   . Thyroid disease   . Trichomonas     Patient Active Problem List   Diagnosis Date Noted  . Urinary tract infection, site not specified 05/26/2013  . Diabetes mellitus, antepartum(648.03) 03/27/2013  . GERD without esophagitis 01/20/2013  . Anxiety and depression 01/20/2013  . Cellulitis of breast 02/05/2012  . Anemia 02/05/2012  . Anxiety   . CARPAL TUNNEL SYNDROME 06/02/2010  . BLURRED VISION 06/02/2010  . DENTAL CARIES 06/02/2010  . FGlandorf PG[place]09/11/2009  . FREQUENCY, URINARY 06/02/2010  . ESR, ELEVATED 06/02/2010  . VITAMIN D DEFICIENCY 04/19/2010  . GENITAL HERPES 04/18/2010  . Goiter, unspecified 04/18/2010  . ANEMIA 04/18/2010  . ANXIETY 04/18/2010  . DEPRESSION 04/18/2010  . CHRONIC PAIN SYNDROME 04/18/2010  . DYSPEPSIA 04/18/2010  . IRRITABLE BOWEL SYNDROME 04/18/2010    Past Surgical History:  Procedure Laterality Date  . CESAREAN SECTION N/A 05/20/2013   Procedure: CESAREAN SECTION;  Surgeon: CShelly Bombard MD;  [facility];  Service: Obstetrics;  Laterality: N/A;  .  INDUCED ABORTION  2009  . VAGINAL DELIVERY  2007    OB History    Gravida Para Term Preterm AB Living   '8 3 3   5 2   ' SAB TAB Ectopic Multiple Live Births   '2 3     2       ' Home Medications    Prior to Admission medications   Medication Sig Start Date End Date Taking? Authorizing Provider  ALPRAZolam (Duanne Moron 1 MG tablet Take 0.5 mg by mouth 3 (three) times daily as needed for anxiety.    Yes Historical Provider, MD  valACYclovir (VALTREX) 1000 MG tablet Take 500 mg by mouth daily. 11/11/15  Yes Historical Provider, MD  diphenhydrAMINE (BENADRYL) 25 MG tablet Take 1 tablet (25 mg total) by mouth every 6 (six) hours as needed for itching. 01/24/16   LDelsa Grana PA-C  divalproex (DEPAKOTE) 250 MG DR tablet Take 250 mg by mouth 2 (two) times daily.    Historical Provider, MD  gabapentin (NEURONTIN) 300 MG capsule Take 300 mg by mouth 2 (two) times daily.    Historical Provider, MD  guaiFENesin-codeine 100-10 MG/5ML syrup Take 5 mLs by mouth 3 (three) times daily as needed for cough. 01/24/16   LDelsa Grana PA-C  ibuprofen (ADVIL,MOTRIN) 600 MG tablet Take 1 tablet (600 mg total) by mouth every 6 (six) hours as needed. 11/22/15  Lisa A Leftwich-Kirby, CNM  metroNIDAZOLE (METROGEL VAGINAL) 0.75 % vaginal gel Place 1 Applicatorful vaginally 2 (two) times daily. 08/22/16 08/27/16  Melony Overly, MD  predniSONE (DELTASONE) 20 MG tablet Take 2 tablets (40 mg total) by mouth daily. Take 40 mg by mouth daily for 3 days, then 29m by mouth daily for 3 days, then 180mdaily for 3 days 01/24/16   LeDelsa GranaPA-C    Family History Family History  Problem Relation Age of Onset  . Breast cancer Other   . Depression Mother   . Anxiety disorder Mother   . Bipolar disorder Brother   . Diabetes Maternal Grandmother   . Cancer Maternal Grandmother   . Hypertension Maternal Grandmother   . Hypertension Paternal Grandmother   . Autism    . Schizophrenia      Social History Social History  Substance  Use Topics  . Smoking status: Former SmResearch scientist (life sciences). Smokeless tobacco: Never Used     Comment: was not a daily smoker, tried it  . Alcohol use Yes     Comment: Twice a month     Allergies   Zofran [ondansetron hcl]   Review of Systems Review of Systems As in history of present illness  Physical Exam Triage Vital Signs ED Triage Vitals  Enc Vitals Group     BP 08/22/16 1823 125/80     Pulse Rate 08/22/16 1823 101     Resp 08/22/16 1823 16     Temp 08/22/16 1823 97.6 F (36.4 C)     Temp Source 08/22/16 1823 Oral     SpO2 08/22/16 1823 100 %     Weight --      Height --      Head Circumference --      Peak Flow --      Pain Score 08/22/16 1830 4     Pain Loc --      Pain Edu? --      Excl. in [place]    No data found.   Updated Vital Signs BP 125/80 (BP Location: Left Arm)   Pulse 101   Temp 97.6 F (36.4 C) (Oral)   Resp 16   LMP 07/22/2016 (Exact Date)   SpO2 100%   Visual Acuity Right Eye Distance:   Left Eye Distance:   Bilateral Distance:    Right Eye Near:   Left Eye Near:    Bilateral Near:     Physical Exam  Constitutional: She is oriented to person, place, and time. She appears well-developed and well-nourished. No distress.  Cardiovascular: Normal rate.   Pulmonary/Chest: Effort normal.  Genitourinary: There is no rash on the right labia. There is no rash on the left labia. Cervix exhibits no motion tenderness and no discharge. No bleeding in the vagina. Vaginal discharge (scant amount of white discharge.  Fishy odor.) found.  Neurological: She is alert and oriented to person, place, and time.     UC Treatments / Results  Labs (all labs ordered are listed, but only abnormal results are displayed) Labs Reviewed  POCT URINALYSIS DIP (DEVICE) - Abnormal; Notable for the following:       Result Value   Bilirubin Urine SMALL (*)    Hgb urine dipstick TRACE (*)    Protein, ur 100 (*)    All other components within normal limits  POCT PREGNANCY,  URINE - Abnormal; Notable for the following:    Preg Test, Ur POSITIVE (*)    All  other components within normal limits  CERVICOVAGINAL ANCILLARY ONLY    EKG  EKG Interpretation None       Radiology No results found.  Procedures Procedures (including critical care time)  Medications Ordered in UC Medications - No data to display   Initial Impression / Assessment and Plan / UC Course  I have reviewed the triage vital signs and the nursing notes.  Pertinent labs & imaging results that were available during my care of the patient were reviewed by me and considered in my medical decision making (see chart for details).  Clinical Course     Treat for BV with MetroGel. Pregnancy precautions reviewed. Follow-up with OB/GYN to establish care.  Final Clinical Impressions(s) / UC Diagnoses   Final diagnoses:  BV (bacterial vaginosis)  Positive pregnancy test    New Prescriptions New Prescriptions   METRONIDAZOLE (METROGEL VAGINAL) 0.75 % VAGINAL GEL    Place 1 Applicatorful vaginally 2 (two) times daily.     Melony Overly, MD 08/22/16 1902

## 2016-08-22 NOTE — Discharge Instructions (Signed)
Congratulations on your pregnancy! Try to eat small meals throughout the day to help with nausea. Please start a prenatal vitamin once a day. You do need to get set up with an OB/GYN for this pregnancy. Use the MetroGel for 5 days for BV. We will call if any of your testing comes back positive. If you develop worsening abdominal pain or vaginal bleeding, [facility]

## 2016-08-22 NOTE — ED Triage Notes (Signed)
[facility] a [facility] of a vaginal discharge x 3 days. The patient also reported that she has had some abdominal cramping. The patient reported that she tried an OTC monistat treatment. The patient reported that she has recently become sexually active again.

## 2016-08-24 LAB — CERVICOVAGINAL ANCILLARY ONLY
Chlamydia: NEGATIVE
Neisseria Gonorrhea: NEGATIVE

## 2016-08-25 ENCOUNTER — Telehealth (HOSPITAL_COMMUNITY): Payer: Self-pay | Admitting: Emergency Medicine

## 2016-08-25 NOTE — Telephone Encounter (Signed)
LM on [phone] Need to give lab results and to see how pt is doing from recent visit on 11/22 Notified pt in gen message that there is NO need to call back Unless not feeling any better, not tolerating meds well or if wanting to know lab results. Also let pt know labs can be obtained from MyChart

## 2016-08-25 NOTE — Telephone Encounter (Signed)
-----   Message from Charm RingsErin J Honig, MD sent at 08/24/2016  5:57 PM EST ----- Please notify patient of negative STD testing.  F/u with OB/GYN to establish prenatal care.  EH

## 2016-08-27 LAB — CERVICOVAGINAL ANCILLARY ONLY: WET PREP (BD AFFIRM): POSITIVE — AB

## 2016-09-07 ENCOUNTER — Inpatient Hospital Stay (HOSPITAL_COMMUNITY): Payer: Medicaid Other

## 2016-09-07 ENCOUNTER — Encounter (HOSPITAL_COMMUNITY): Payer: Self-pay | Admitting: Certified Nurse Midwife

## 2016-09-07 ENCOUNTER — Inpatient Hospital Stay (HOSPITAL_COMMUNITY)
Admission: AD | Admit: 2016-09-07 | Discharge: 2016-09-07 | Disposition: A | Discharge status: Home / Self Care | Payer: Medicaid Other | Source: Ambulatory Visit | Attending: Obstetrics and Gynecology | Admitting: Obstetrics and Gynecology

## 2016-09-07 DIAGNOSIS — Z3A01 Less than 8 weeks gestation of pregnancy: Secondary | ICD-10-CM | POA: Diagnosis not present

## 2016-09-07 DIAGNOSIS — R102 Pelvic and perineal pain: Secondary | ICD-10-CM | POA: Diagnosis present

## 2016-09-07 DIAGNOSIS — Z87891 Personal history of nicotine dependence: Secondary | ICD-10-CM | POA: Insufficient documentation

## 2016-09-07 DIAGNOSIS — O26891 Other specified pregnancy related conditions, first trimester: Secondary | ICD-10-CM | POA: Insufficient documentation

## 2016-09-07 DIAGNOSIS — Z3490 Encounter for supervision of normal pregnancy, unspecified, unspecified trimester: Secondary | ICD-10-CM

## 2016-09-07 LAB — URINALYSIS, ROUTINE W REFLEX MICROSCOPIC
Bilirubin Urine: NEGATIVE
GLUCOSE, UA: NEGATIVE mg/dL
Hgb urine dipstick: NEGATIVE
Ketones, ur: NEGATIVE mg/dL
Leukocytes, UA: NEGATIVE
Nitrite: NEGATIVE
PROTEIN: 30 mg/dL — AB
RBC / HPF: NONE SEEN RBC/hpf (ref 0–5)
Specific Gravity, Urine: 1.018 (ref 1.005–1.030)
pH: 6 (ref 5.0–8.0)

## 2016-09-07 LAB — POCT PREGNANCY, URINE: Preg Test, Ur: POSITIVE — AB

## 2016-09-07 LAB — CBC
HCT: 33.9 % — ABNORMAL LOW (ref 36.0–46.0)
Hemoglobin: 11.1 g/dL — ABNORMAL LOW (ref 12.0–15.0)
MCH: 24.2 pg — AB (ref 26.0–34.0)
MCHC: 32.7 g/dL (ref 30.0–36.0)
MCV: 73.9 fL — ABNORMAL LOW (ref 78.0–100.0)
Platelets: 318 10*3/uL (ref 150–400)
RBC: 4.59 MIL/uL (ref 3.87–5.11)
RDW: 17 % — ABNORMAL HIGH (ref 11.5–15.5)
WBC: 12.1 10*3/uL — AB (ref 4.0–10.5)

## 2016-09-07 LAB — RPR: RPR: NONREACTIVE

## 2016-09-07 LAB — HIV ANTIBODY (ROUTINE TESTING W REFLEX): HIV Screen 4th Generation wRfx: NONREACTIVE

## 2016-09-07 LAB — WET PREP, GENITAL
Clue Cells Wet Prep HPF POC: NONE SEEN
Sperm: NONE SEEN
TRICH WET PREP: NONE SEEN
YEAST WET PREP: NONE SEEN

## 2016-09-07 LAB — HCG, QUANTITATIVE, PREGNANCY: hCG, Beta Chain, Quant, S: 47640 m[IU]/mL — ABNORMAL HIGH (ref <5)

## 2016-09-07 NOTE — Discharge Instructions (Signed)
Safe Medications in Pregnancy   Acne: Benzoyl Peroxide Salicylic Acid  Backache/Headache: Tylenol: 2 regular strength every 4 hours OR              2 Extra strength every 6 hours  Colds/Coughs/Allergies: Benadryl (alcohol free) 25 mg every 6 hours as needed Breath right strips Claritin Cepacol throat lozenges Chloraseptic throat spray Cold-Eeze- up to three times per day Cough drops, alcohol free Flonase (by prescription only) Guaifenesin Mucinex Robitussin DM (plain only, alcohol free) Saline nasal spray/drops Sudafed (pseudoephedrine) & Actifed ** use only after [redacted] weeks gestation and if you do not have high blood pressure Tylenol Vicks Vaporub Zinc lozenges Zyrtec   Constipation: Colace Ducolax suppositories Fleet enema Glycerin suppositories Metamucil Milk of magnesia Miralax Senokot Smooth move tea  Diarrhea: Kaopectate Imodium A-D  *NO pepto Bismol  Hemorrhoids: Anusol Anusol HC Preparation H Tucks  Indigestion: Tums Maalox Mylanta Zantac  Pepcid  Insomnia: Benadryl (alcohol free) 25mg  every 6 hours as needed Tylenol PM Unisom, no Gelcaps  Leg Cramps: Tums MagGel  Nausea/Vomiting:  Bonine Dramamine Emetrol Ginger extract Sea bands Meclizine  Nausea medication to take during pregnancy:  Unisom (doxylamine succinate 25 mg tablets) Take one tablet daily at bedtime. If symptoms are not adequately controlled, the dose can be increased to a maximum recommended dose of two tablets daily (1/2 tablet in the morning, 1/2 tablet mid-afternoon and one at bedtime). Vitamin B6 100mg  tablets. Take one tablet twice a day (up to 200 mg per day).  Skin Rashes: Aveeno products Benadryl cream or 25mg  every 6 hours as needed Calamine Lotion 1% cortisone cream  Yeast infection: Gyne-lotrimin 7 Monistat 7   **If taking multiple medications, please check labels to avoid duplicating the same active ingredients **take medication as directed on  the label ** Do not exceed 4000 mg of tylenol in 24 hours **Do not take medications that contain aspirin or ibuprofen     First Trimester of Pregnancy The first trimester of pregnancy is from week 1 until the end of week 12 (months 1 through 3). A week after a sperm fertilizes an egg, the egg will implant on the wall of the uterus. This embryo will begin to develop into a baby. Genes from you and your partner are forming the baby. The female genes determine whether the baby is a boy or a girl. At 6-8 weeks, the eyes and face are formed, and the heartbeat can be seen on ultrasound. At the end of 12 weeks, all the baby's organs are formed.  Now that you are pregnant, you will want to do everything you can to have a healthy baby. Two of the most important things are to get good prenatal care and to follow your health care provider's instructions. Prenatal care is all the medical care you receive before the baby's birth. This care will help prevent, find, and treat any problems during the pregnancy and childbirth. BODY CHANGES Your body goes through many changes during pregnancy. The changes vary from woman to woman.   You may gain or lose a couple of pounds at first.  You may feel sick to your stomach (nauseous) and throw up (vomit). If the vomiting is uncontrollable, call your health care provider.  You may tire easily.  You may develop headaches that can be relieved by medicines approved by your health care provider.  You may urinate more often. Painful urination may mean you have a bladder infection.  You may develop heartburn as a result of  your pregnancy.  You may develop constipation because certain hormones are causing the muscles that push waste through your intestines to slow down.  You may develop hemorrhoids or swollen, bulging veins (varicose veins).  Your breasts may begin to grow larger and become tender. Your nipples may stick out more, and the tissue that surrounds them  (areola) may become darker.  Your gums may bleed and may be sensitive to brushing and flossing.  Dark spots or blotches (chloasma, mask of pregnancy) may develop on your face. This will likely fade after the baby is born.  Your menstrual periods will stop.  You may have a loss of appetite.  You may develop cravings for certain kinds of food.  You may have changes in your emotions from day to day, such as being excited to be pregnant or being concerned that something may go wrong with the pregnancy and baby.  You may have more vivid and strange dreams.  You may have changes in your hair. These can include thickening of your hair, rapid growth, and changes in texture. Some women also have hair loss during or after pregnancy, or hair that feels dry or thin. Your hair will most likely return to normal after your baby is born. WHAT TO EXPECT AT YOUR PRENATAL VISITS During a routine prenatal visit:  You will be weighed to make sure you and the baby are growing normally.  Your blood pressure will be taken.  Your abdomen will be measured to track your baby's growth.  The fetal heartbeat will be listened to starting around week 10 or 12 of your pregnancy.  Test results from any previous visits will be discussed. Your health care provider may ask you:  How you are feeling.  If you are feeling the baby move.  If you have had any abnormal symptoms, such as leaking fluid, bleeding, severe headaches, or abdominal cramping.  If you are using any tobacco products, including cigarettes, chewing tobacco, and electronic cigarettes.  If you have any questions. Other tests that may be performed during your first trimester include:  Blood tests to find your blood type and to check for the presence of any previous infections. They will also be used to check for low iron levels (anemia) and Rh antibodies. Later in the pregnancy, blood tests for diabetes will be done along with other tests if  problems develop.  Urine tests to check for infections, diabetes, or protein in the urine.  An ultrasound to confirm the proper growth and development of the baby.  An amniocentesis to check for possible genetic problems.  Fetal screens for spina bifida and Down syndrome.  You may need other tests to make sure you and the baby are doing well.  HIV (human immunodeficiency virus) testing. Routine prenatal testing includes screening for HIV, unless you choose not to have this test. HOME CARE INSTRUCTIONS  Medicines   Follow your health care provider's instructions regarding medicine use. Specific medicines may be either safe or unsafe to take during pregnancy.  Take your prenatal vitamins as directed.  If you develop constipation, try taking a stool softener if your health care provider approves. Diet   Eat regular, well-balanced meals. Choose a variety of foods, such as meat or vegetable-based protein, fish, milk and low-fat dairy products, vegetables, fruits, and whole grain breads and cereals. Your health care provider will help you determine the amount of weight gain that is right for you.  Avoid raw meat and uncooked cheese. These carry germs  that can cause birth defects in the baby.  Eating four or five small meals rather than three large meals a day may help relieve nausea and vomiting. If you start to feel nauseous, eating a few soda crackers can be helpful. Drinking liquids between meals instead of during meals also seems to help nausea and vomiting.  If you develop constipation, eat more high-fiber foods, such as fresh vegetables or fruit and whole grains. Drink enough fluids to keep your urine clear or pale yellow. Activity and Exercise   Exercise only as directed by your health care provider. Exercising will help you:  Control your weight.  Stay in shape.  Be prepared for labor and delivery.  Experiencing pain or cramping in the lower abdomen or low back is a good  sign that you should stop exercising. Check with your health care provider before continuing normal exercises.  Try to avoid standing for long periods of time. Move your legs often if you must stand in one place for a long time.  Avoid heavy lifting.  Wear low-heeled shoes, and practice good posture.  You may continue to have sex unless your health care provider directs you otherwise. Relief of Pain or Discomfort   Wear a good support bra for breast tenderness.   Take warm sitz baths to soothe any pain or discomfort caused by hemorrhoids. Use hemorrhoid cream if your health care provider approves.   Rest with your legs elevated if you have leg cramps or low back pain.  If you develop varicose veins in your legs, wear support hose. Elevate your feet for 15 minutes, 3-4 times a day. Limit salt in your diet. Prenatal Care   Schedule your prenatal visits by the twelfth week of pregnancy. They are usually scheduled monthly at first, then more often in the last 2 months before delivery.  Write down your questions. Take them to your prenatal visits.  Keep all your prenatal visits as directed by your health care provider. Safety   Wear your seat belt at all times when driving.  Make a list of emergency phone numbers, including numbers for family, friends, the hospital, and police and fire departments. General Tips   Ask your health care provider for a referral to a local prenatal education class. Begin classes no later than at the beginning of month 6 of your pregnancy.  Ask for help if you have counseling or nutritional needs during pregnancy. Your health care provider can offer advice or refer you to specialists for help with various needs.  Do not use hot tubs, steam rooms, or saunas.  Do not douche or use tampons or scented sanitary pads.  Do not cross your legs for long periods of time.  Avoid cat litter boxes and soil used by cats. These carry germs that can cause birth  defects in the baby and possibly loss of the fetus by miscarriage or stillbirth.  Avoid all smoking, herbs, alcohol, and medicines not prescribed by your health care provider. Chemicals in these affect the formation and growth of the baby.  Do not use any tobacco products, including cigarettes, chewing tobacco, and electronic cigarettes. If you need help quitting, ask your health care provider. You may receive counseling support and other resources to help you quit.  Schedule a dentist appointment. At home, brush your teeth with a soft toothbrush and be gentle when you floss. SEEK MEDICAL CARE IF:   You have dizziness.  You have mild pelvic cramps, pelvic pressure, or nagging pain  in the abdominal area.  You have persistent nausea, vomiting, or diarrhea.  You have a bad smelling vaginal discharge.  You have pain with urination.  You notice increased swelling in your face, hands, legs, or ankles. SEEK IMMEDIATE MEDICAL CARE IF:   You have a fever.  You are leaking fluid from your vagina.  You have spotting or bleeding from your vagina.  You have severe abdominal cramping or pain.  You have rapid weight gain or loss.  You vomit blood or material that looks like coffee grounds.  You are exposed to [place] measles and have never had them.  You are exposed to fifth disease or chickenpox.  You develop a severe headache.  You have shortness of breath.  You have any kind of trauma, such as from a fall or a car accident. This information is not intended to replace advice given to you by your health care provider. Make sure you discuss any questions you have with your health care provider. Document Released: 09/11/2001 Document Revised: 10/08/2014 Document Reviewed: 07/28/2013 Elsevier Interactive Patient Education  2017 [employer]

## 2016-09-07 NOTE — MAU Provider Note (Signed)
History     CSN: [id number]  Arrival date and time: 09/07/16 8295   First Provider Initiated Contact with Patient 09/07/16 [phone]      Chief Complaint  Patient presents with  . Pelvic Pain   Pelvic Pain  The patient's primary symptoms include pelvic pain. This is a new problem. The current episode started in the past 7 days. The problem occurs constantly. The problem has been unchanged. Pain severity now: 7/10. The problem affects the left side. She is pregnant. Associated symptoms include abdominal pain, back pain, constipation, diarrhea, nausea and vomiting. Pertinent negatives include no chills, dysuria, fever, frequency or urgency. The vaginal discharge was normal. There has been no bleeding. Nothing aggravates the symptoms. She has tried acetaminophen for the symptoms. The treatment provided no relief. She uses nothing for contraception. Her menstrual history has been regular (LMP approx "end of october").     Past Medical History:  Diagnosis Date  . Anemia   . Anxiety    off meds with preg  . Carpal tunnel syndrome   . Chlamydia   . 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 GERD (gastroesophageal reflux disease)   . Gestational diabetes    diet controlled  . Headache(784.0)   . IBS (irritable bowel syndrome)   . Infection    UTI  . Mood swings (HCC)   . Panic attack   . Thyroid disease   . Trichomonas     Past Surgical History:  Procedure Laterality Date  . CESAREAN SECTION N/A 05/20/2013   Procedure: CESAREAN SECTION;  Surgeon: Brock Bad, MD;  Location: [facility];  Service: Obstetrics;  Laterality: N/A;  . INDUCED ABORTION  2009  . VAGINAL DELIVERY  2007    Family History  Problem Relation Age of Onset  . Breast cancer Other   . Depression Mother   . Anxiety disorder Mother   . Bipolar disorder Brother   . Diabetes Maternal Grandmother   . Cancer Maternal Grandmother   . Hypertension Maternal Grandmother   . Hypertension  Paternal Grandmother   . Autism    . Schizophrenia      Social History  Substance Use Topics  . Smoking status: Former Games developer  . Smokeless tobacco: Never Used     Comment: was not a daily smoker, tried it  . Alcohol use Yes     Comment: Twice a month    Allergies:  Allergies  Allergen Reactions  . Zofran [Ondansetron Hcl] Nausea And Vomiting    Prescriptions Prior to Admission  Medication Sig Dispense Refill Last Dose  . ALPRAZolam (XANAX) 1 MG tablet Take 0.5 mg by mouth 3 (three) times daily as needed for anxiety.    Past Month at Unknown time  . diphenhydrAMINE (BENADRYL) 25 MG tablet Take 1 tablet (25 mg total) by mouth every 6 (six) hours as needed for itching. 20 tablet 0   . divalproex (DEPAKOTE) 250 MG DR tablet Take 250 mg by mouth 2 (two) times daily.   More than a month at Unknown time  . gabapentin (NEURONTIN) 300 MG capsule Take 300 mg by mouth 2 (two) times daily.   More than a month at Unknown time  . guaiFENesin-codeine 100-10 MG/5ML syrup Take 5 mLs by mouth 3 (three) times daily as needed for cough. 120 mL 0   . ibuprofen (ADVIL,MOTRIN) 600 MG tablet Take 1 tablet (600 mg total) by mouth every 6 (six) hours  as needed. 30 tablet 0   . predniSONE (DELTASONE) 20 MG tablet Take 2 tablets (40 mg total) by mouth daily. Take 40 mg by mouth daily for 3 days, then 20mg  by mouth daily for 3 days, then 10mg  daily for 3 days 12 tablet 0   . valACYclovir (VALTREX) 1000 MG tablet Take 500 mg by mouth daily.  3 Past Month at Unknown time    Review of Systems  Constitutional: Negative for chills and fever.  Gastrointestinal: Positive for abdominal pain, constipation, diarrhea, nausea and vomiting.  Genitourinary: Positive for pelvic pain. Negative for dysuria, frequency and urgency.  Musculoskeletal: Positive for back pain.   Physical Exam   Blood pressure 115/66, pulse 78, temperature 97.5 F (36.4 C), temperature source Oral, resp. rate 16, height 5\' 7"  (1.702 m), weight  241 lb (109.3 kg), last menstrual period 07/22/2016, SpO2 98 %.  Physical Exam  Nursing note and vitals reviewed. Constitutional: She is oriented to person, place, and time. She appears well-developed and well-nourished. No distress.  HENT:  Head: Normocephalic.  Cardiovascular: Normal rate.   Respiratory: Effort normal.  GI: Soft. There is no tenderness. There is no rebound.  Neurological: She is alert and oriented to person, place, and time.  Skin: Skin is warm and dry.  Psychiatric: She has a normal mood and affect.   Results for orders placed or performed during the hospital encounter of 09/07/16 (from the past 24 hour(s))  Urinalysis, Routine w reflex microscopic     Status: Abnormal   Collection Time: 09/07/16  3:20 AM  Result Value Ref Range   Color, Urine YELLOW YELLOW   APPearance CLEAR CLEAR   Specific Gravity, Urine 1.018 1.005 - 1.030   pH 6.0 5.0 - 8.0   Glucose, UA NEGATIVE NEGATIVE mg/dL   Hgb urine dipstick NEGATIVE NEGATIVE   Bilirubin Urine NEGATIVE NEGATIVE   Ketones, ur NEGATIVE NEGATIVE mg/dL   Protein, ur 30 (A) NEGATIVE mg/dL   Nitrite NEGATIVE NEGATIVE   Leukocytes, UA NEGATIVE NEGATIVE   RBC / HPF NONE SEEN 0 - 5 RBC/hpf   WBC, UA 0-5 0 - 5 WBC/hpf   Bacteria, UA RARE (A) NONE SEEN   Squamous Epithelial / LPF 0-5 (A) NONE SEEN   Mucous PRESENT   Pregnancy, urine POC     Status: Abnormal   Collection Time: 09/07/16  3:40 AM  Result Value Ref Range   Preg Test, Ur POSITIVE (A) NEGATIVE  Wet prep, genital     Status: Abnormal   Collection Time: 09/07/16  4:24 AM  Result Value Ref Range   Yeast Wet Prep HPF POC NONE SEEN NONE SEEN   Trich, Wet Prep NONE SEEN NONE SEEN   Clue Cells Wet Prep HPF POC NONE SEEN NONE SEEN   WBC, Wet Prep HPF POC MODERATE (A) NONE SEEN   Sperm NONE SEEN   CBC     Status: Abnormal   Collection Time: 09/07/16  4:33 AM  Result Value Ref Range   WBC 12.1 (H) 4.0 - 10.5 K/uL   RBC 4.59 3.87 - 5.11 MIL/uL   Hemoglobin 11.1  (L) 12.0 - 15.0 g/dL   HCT 60.433.9 (L) 54.036.0 - 98.146.0 %   MCV 73.9 (L) 78.0 - 100.0 fL   MCH 24.2 (L) 26.0 - 34.0 pg   MCHC 32.7 30.0 - 36.0 g/dL   RDW 19.117.0 (H) 47.811.5 - 29.515.5 %   Platelets 318 150 - 400 K/uL  hCG, quantitative, pregnancy  Status: Abnormal   Collection Time: 09/07/16  4:33 AM  Result Value Ref Range   hCG, Beta Chain, Quant, S 47,640 (H) <5 mIU/mL   [place] Ob Comp Less 14 Wks  Result Date: 09/07/2016 CLINICAL DATA:  Back pain for 5 days. EXAM: OBSTETRIC <14 WK US AND TRANSVAGINAL OB US TECHNIQUE: Both transabdominal and transvaginal ultrasound examinations were performed for complete evaluation of the gestation as well as the maternal uterus, adnexal regions, and pelvic cul-de-sac. Transvaginal technique was performed to assess early pregnancy. COMPARISON:  None. FINDINGS: Intrauterine gestational sac: Single Yolk sac:  Visible Embryo:  Visible Cardiac Activity: Visible Heart Rate: 121  bpm MSD:   mm    w     d CRL:  8.1  mm   6 w   5 d                  US EDC: 04/28/2017 Subchorionic hemorrhage:  None visualized. Maternal uterus/adnexae: Normal ovaries. No abnormal pelvic fluid collections. IMPRESSION: Single living intrauterine gestation measuring 6 weeks 5 days by crown-rump length. Electronically Signed   By: Ellery Plunkaniel R Mitchell M.D.   On: 09/07/2016 06:11   [place] Ob Transvaginal  Result Date: 09/07/2016 CLINICAL DATA:  Back pain for 5 days. EXAM: OBSTETRIC <14 WK US AND TRANSVAGINAL OB US TECHNIQUE: Both transabdominal and transvaginal ultrasound examinations were performed for complete evaluation of the gestation as well as the maternal uterus, adnexal regions, and pelvic cul-de-sac. Transvaginal technique was performed to assess early pregnancy. COMPARISON:  None. FINDINGS: Intrauterine gestational sac: Single Yolk sac:  Visible Embryo:  Visible Cardiac Activity: Visible Heart Rate: 121  bpm MSD:   mm    w     d CRL:  8.1  mm   6 w   5 d                  US EDC: 04/28/2017 Subchorionic  hemorrhage:  None visualized. Maternal uterus/adnexae: Normal ovaries. No abnormal pelvic fluid collections. IMPRESSION: Single living intrauterine gestation measuring 6 weeks 5 days by crown-rump length. Electronically Signed   By: Ellery Plunkaniel R Mitchell M.D.   On: 09/07/2016 06:11    MAU Course  Procedures  MDM   Assessment and Plan   1. Intrauterine pregnancy   2. Pelvic pain affecting pregnancy in first trimester, antepartum   3. [redacted] weeks gestation of pregnancy    DC home Comfort measures reviewed  1st Trimester precautions  RX: none  Return to MAU as needed FU with OB as planned  Follow-up Information    [facility]   Contact information: [address] [place] [address] [phone]            Tawnya CrookHogan, Airabella Barley Donovan 09/07/2016, 6:22 AM

## 2016-09-07 NOTE — MAU Note (Signed)
Patient presents to MAU with back pain. Patient states back pain is on left side that began 5 days ago. Patient denies lower abdominal cramping, discharge or bleeding

## 2016-09-08 ENCOUNTER — Encounter (HOSPITAL_COMMUNITY): Payer: Self-pay | Admitting: Certified Nurse Midwife

## 2016-09-10 LAB — GC/CHLAMYDIA PROBE AMP (~~LOC~~) NOT AT ARMC
Chlamydia: NEGATIVE
NEISSERIA GONORRHEA: NEGATIVE

## 2016-09-27 ENCOUNTER — Encounter: Payer: Self-pay | Admitting: *Deleted

## 2016-09-27 ENCOUNTER — Other Ambulatory Visit (HOSPITAL_COMMUNITY)
Admission: RE | Admit: 2016-09-27 | Discharge: 2016-09-27 | Disposition: A | Discharge status: Home / Self Care | Payer: Medicaid Other | Source: Ambulatory Visit | Attending: Obstetrics | Admitting: Obstetrics

## 2016-09-27 ENCOUNTER — Ambulatory Visit (INDEPENDENT_AMBULATORY_CARE_PROVIDER_SITE_OTHER): Payer: Medicaid Other | Admitting: Certified Nurse Midwife

## 2016-09-27 ENCOUNTER — Encounter: Payer: Self-pay | Admitting: Certified Nurse Midwife

## 2016-09-27 VITALS — BP 115/77 | HR 91

## 2016-09-27 DIAGNOSIS — Z3491 Encounter for supervision of normal pregnancy, unspecified, first trimester: Secondary | ICD-10-CM | POA: Diagnosis not present

## 2016-09-27 DIAGNOSIS — F329 Major depressive disorder, single episode, unspecified: Secondary | ICD-10-CM

## 2016-09-27 DIAGNOSIS — Z01419 Encounter for gynecological examination (general) (routine) without abnormal findings: Secondary | ICD-10-CM | POA: Insufficient documentation

## 2016-09-27 DIAGNOSIS — O34219 Maternal care for unspecified type scar from previous cesarean delivery: Secondary | ICD-10-CM | POA: Diagnosis not present

## 2016-09-27 DIAGNOSIS — F32A Depression, unspecified: Secondary | ICD-10-CM

## 2016-09-27 DIAGNOSIS — Z98891 History of uterine scar from previous surgery: Secondary | ICD-10-CM

## 2016-09-27 DIAGNOSIS — Z3481 Encounter for supervision of other normal pregnancy, first trimester: Secondary | ICD-10-CM | POA: Diagnosis not present

## 2016-09-27 DIAGNOSIS — F418 Other specified anxiety disorders: Secondary | ICD-10-CM

## 2016-09-27 DIAGNOSIS — O0990 Supervision of high risk pregnancy, unspecified, unspecified trimester: Secondary | ICD-10-CM | POA: Insufficient documentation

## 2016-09-27 DIAGNOSIS — O98511 Other viral diseases complicating pregnancy, first trimester: Secondary | ICD-10-CM

## 2016-09-27 DIAGNOSIS — O219 Vomiting of pregnancy, unspecified: Secondary | ICD-10-CM | POA: Diagnosis not present

## 2016-09-27 DIAGNOSIS — O98311 Other infections with a predominantly sexual mode of transmission complicating pregnancy, first trimester: Secondary | ICD-10-CM | POA: Diagnosis not present

## 2016-09-27 DIAGNOSIS — F419 Anxiety disorder, unspecified: Secondary | ICD-10-CM

## 2016-09-27 DIAGNOSIS — A6009 Herpesviral infection of other urogenital tract: Secondary | ICD-10-CM | POA: Diagnosis not present

## 2016-09-27 DIAGNOSIS — B009 Herpesviral infection, unspecified: Secondary | ICD-10-CM

## 2016-09-27 DIAGNOSIS — Z3490 Encounter for supervision of normal pregnancy, unspecified, unspecified trimester: Secondary | ICD-10-CM

## 2016-09-27 HISTORY — DX: History of uterine scar from previous surgery: Z98.891

## 2016-09-27 MED ORDER — OB COMPLETE PETITE 35-5-1-200 MG PO CAPS: 1.0000 | ORAL_CAPSULE | Freq: Every day | ORAL | 12 refills | Status: DC

## 2016-09-27 MED ORDER — DOXYLAMINE-PYRIDOXINE 10-10 MG PO TBEC: DELAYED_RELEASE_TABLET | ORAL | 4 refills | Status: DC

## 2016-09-27 MED ORDER — VALACYCLOVIR HCL 1 G PO TABS: 500.0000 mg | ORAL_TABLET | Freq: Every day | ORAL | 3 refills | Status: DC

## 2016-09-27 NOTE — Progress Notes (Signed)
Subjective:    Melinda Jimenez is being seen today for her first obstetrical visit.  This is a planned pregnancy. She is at 6456w4d gestation. Her obstetrical history is significant for obesity and previous C-section, hx of HSV, Hx of GDM. Relationship with FOB: significant other, living together. Patient does intend to breast feed. Pregnancy history fully reviewed.  Reports bright, red vaginal bleeding after sexual intercourse.    The information documented in the HPI was reviewed and verified.  Menstrual History: OB History    Gravida Para Term Preterm AB Living   9 3 3   5 2    SAB TAB Ectopic Multiple Live Births   2 3     2        Patient's last menstrual period was 07/22/2016 (exact date).    Past Medical History:  Diagnosis Date  . Anemia   . Anxiety    off meds with preg  . Carpal tunnel syndrome   . Chlamydia   . Genital HSV    Last OB: > 6434yr ago; Valtrex 500 mg po prn outbreaks  . GERD (gastroesophageal reflux disease)   . Gestational diabetes    diet controlled  . Headache(784.0)   . IBS (irritable bowel syndrome)   . Infection    UTI  . Mood swings (HCC)   . Panic attack   . Thyroid disease   . Trichomonas     Past Surgical History:  Procedure Laterality Date  . CESAREAN SECTION N/A 05/20/2013   Procedure: CESAREAN SECTION;  Surgeon: [name], MD;  Location: [facility];  Service: Obstetrics;  Laterality: N/A;  . INDUCED ABORTION  2009  . VAGINAL DELIVERY  2007     (Not in a hospital admission) Allergies  Allergen Reactions  . Zofran [Ondansetron Hcl] Nausea And Vomiting    Social History  Substance Use Topics  . Smoking status: Former Games developermoker  . Smokeless tobacco: Never Used     Comment: was not a daily smoker, tried it  . Alcohol use No     Comment: Twice a month    Family History  Problem Relation Age of Onset  . Depression Mother   . Anxiety disorder Mother   . Breast cancer Other   . Bipolar disorder Brother   . Diabetes Maternal  Grandmother   . Cancer Maternal Grandmother   . Hypertension Maternal Grandmother   . Hypertension Paternal Grandmother   . Autism    . Schizophrenia       Review of Systems Constitutional: negative for weight loss Gastrointestinal: negative for vomiting Genitourinary:negative for genital lesions and vaginal discharge and dysuria Musculoskeletal:negative for back pain Behavioral/Psych: negative for abusive relationship, depression, illegal drug usage and tobacco use    Objective:    BP 115/77   Pulse 91   LMP 07/22/2016 (Exact Date)  General Appearance:    Alert, cooperative, no distress, appears stated age  Head:    Normocephalic, without obvious abnormality, atraumatic  Eyes:    PERRL, conjunctiva/corneas clear, EOM's intact, fundi    benign, both eyes  Ears:    Normal TM's and external ear canals, both ears  Nose:   Nares normal, septum midline, mucosa normal, no drainage    or sinus tenderness  Throat:   Lips, mucosa, and tongue normal; teeth and gums normal  Neck:   Supple, symmetrical, trachea midline, no adenopathy;    thyroid:  no enlargement/tenderness/nodules; no carotid   bruit or JVD  Back:  Symmetric, no curvature, ROM normal, no CVA tenderness  Lungs:     Clear to auscultation bilaterally, respirations unlabored  Chest Wall:    No tenderness or deformity   Heart:    Regular rate and rhythm, S1 and S2 normal, no murmur, rub   or gallop  Breast Exam:    No tenderness, masses, or nipple abnormality  Abdomen:     Soft, non-tender, bowel sounds active all four quadrants,    no masses, no organomegaly  Genitalia:    Normal female without lesion, discharge or tenderness  Extremities:   Extremities normal, atraumatic, no cyanosis or edema  Pulses:   2+ and symmetric all extremities  Skin:   Skin color, texture, turgor normal, no rashes or lesions  Lymph nodes:   Cervical, supraclavicular, and axillary nodes normal  Neurologic:   CNII-XII intact, normal strength,  sensation and reflexes    throughout     Cervix:  Long, thick closed and posterior.  FHR:  160's by doppler.  FH: roughly 9 weeks.     Lab Review Urine pregnancy test Labs reviewed yes Radiologic studies reviewed yes Assessment:    Pregnancy at [redacted]w[redacted]d weeks   H/O Herpes  H/O C-section X1: tolac planned  H/O Depression/anxiety  H/O GDM  Probable placenta previa: precautions given  H/O infant death at 30 year of age.   Plan:    Hx of Depression/anxiety: start back on medications after 13 weeks TOLAC consent completed.  Prenatal vitamins.  Counseling provided regarding continued use of seat belts, cessation of alcohol consumption, smoking or use of illicit drugs; infection precautions i.e., influenza/TDAP immunizations, toxoplasmosis,CMV, parvovirus, listeria and varicella; workplace safety, exercise during pregnancy; routine dental care, safe medications, sexual activity, hot tubs, saunas, pools, travel, caffeine use, fish and methlymercury, potential toxins, hair treatments, varicose veins Weight gain recommendations per IOM guidelines reviewed: underweight/BMI< 18.5--> gain 28 - 40 lbs; normal weight/BMI 18.5 - 24.9--> gain 25 - 35 lbs; overweight/BMI 25 - 29.9--> gain 15 - 25 lbs; obese/BMI >30->gain  11 - 20 lbs Problem list reviewed and updated. FIRST/CF mutation testing/NIPT/QUAD SCREEN/fragile X/Ashkenazi Jewish population testing/Spinal muscular atrophy discussed: ordered. Role of ultrasound in pregnancy discussed; fetal survey: requested. Amniocentesis discussed: not indicated. VBAC calculator score: VBAC consent form provided Meds ordered this encounter  Medications  . Doxylamine-Pyridoxine (DICLEGIS) 10-10 MG TBEC    Sig: Take 1 tablet with breakfast and lunch.  Take 2 tablets at bedtime.    Dispense:  100 tablet    Refill:  4  . valACYclovir (VALTREX) 1000 MG tablet    Sig: Take 0.5 tablets (500 mg total) by mouth daily.    Dispense:  30 tablet    Refill:  3  .  Prenat-FeCbn-FeAspGl-FA-Omega (OB COMPLETE PETITE) 35-5-1-200 MG CAPS    Sig: Take 1 tablet by mouth daily.    Dispense:  30 capsule    Refill:  12   Orders Placed This Encounter  Procedures  . Culture, OB Urine  . Hemoglobinopathy evaluation  . Varicella zoster antibody, IgG  . VITAMIN D 25 Hydroxy (Vit-D Deficiency, Fractures)  . Cystic Fibrosis Mutation 97  . Obstetric Panel, Including HIV  . ToxASSURE Select 13 (MW), Urine  . MaterniT21 PLUS Core+SCA    Order Specific Question:   Is the patient insulin dependent?    Answer:   No    Order Specific Question:   Please enter gestational age. This should be expressed as weeks AND days, i.e. 16w 6d. Enter weeks here.  Enter days in next question.    Answer:   1099    Order Specific Question:   Please enter gestational age. This should be expressed as weeks AND days, i.e. 16w 6d. Enter days here. Enter weeks in previous question.    Answer:   4    Order Specific Question:   How was gestational age calculated?    Answer:   LMP    Order Specific Question:   Please give the date of LMP OR Ultrasound OR Estimated date of delivery.    Answer:   04/28/2017    Order Specific Question:   Number of Fetuses (Type of Pregnancy):    Answer:   1    Order Specific Question:   Indications for performing the test? (please choose all that apply):    Answer:   Routine screening    Order Specific Question:   Other Indications? (Y=Yes, N=No)    Answer:   N    Order Specific Question:   If this is a repeat specimen, please indicate the reason:    Answer:   Not indicated    Order Specific Question:   Please specify the patient's race: (C=White/Caucasion, B=Black, I=Native American, A=Asian, H=Hispanic, O=Other, U=Unknown)    Answer:   B    Order Specific Question:   Donor Egg - indicate if the egg was obtained from in vitro fertilization.    Answer:   N    Order Specific Question:   Age of Egg Donor.    Answer:   [age]    Order Specific Question:   Prior Down  Syndrome/ONTD screening during current pregnancy.    Answer:   N    Order Specific Question:   Prior First Trimester Testing    Answer:   N    Order Specific Question:   Prior Second Trimester Testing    Answer:   N    Order Specific Question:   Family History of Neural Tube Defects    Answer:   N    Order Specific Question:   Prior Pregnancy with Down Syndrome    Answer:   N    Order Specific Question:   Please give the patient's weight (in pounds)    Answer:   241  . Hemoglobin A1c    Follow up in 4 weeks. 50% of 45 min visit spent on counseling and coordination of care.

## 2016-09-27 NOTE — Progress Notes (Signed)
Pt needs Rx for N&V. Pt is having increased anxiety, pt states she has been on Prozac and Alprazolam in past. Pt has not had any meds in the past month. Pt did not like some side effects from medication.

## 2016-09-30 LAB — NUSWAB VG+, CANDIDA 6SP
Atopobium vaginae: HIGH Score — AB
BVAB 2: HIGH Score — AB
CANDIDA ALBICANS, NAA: POSITIVE — AB
CANDIDA GLABRATA, NAA: NEGATIVE
Candida krusei, NAA: NEGATIVE
Candida lusitaniae, NAA: NEGATIVE
Candida parapsilosis, NAA: NEGATIVE
Candida tropicalis, NAA: NEGATIVE
Chlamydia trachomatis, NAA: NEGATIVE
Megasphaera 1: HIGH Score — AB
Neisseria gonorrhoeae, NAA: NEGATIVE
Trich vag by NAA: NEGATIVE

## 2016-10-02 ENCOUNTER — Telehealth: Payer: Self-pay | Admitting: *Deleted

## 2016-10-02 ENCOUNTER — Other Ambulatory Visit: Payer: Self-pay | Admitting: Certified Nurse Midwife

## 2016-10-02 LAB — URINE CULTURE, OB REFLEX

## 2016-10-02 LAB — CULTURE, OB URINE

## 2016-10-02 NOTE — Telephone Encounter (Signed)
They did not check for that at her 6 week ultrasound, she has an appointment scheduled for later this month. I suspect that she did based on her symptoms of bright red vaginal bleeding, no sexual intercourse. They will check for this at her anatomy US.  She can go back to work/school next week.  If she has increased bleeding then she needs to go to MAU for evaluation.  About the hip pain, 2 extra strength tylenol every 6 hours, yoga stretching, heating pad.

## 2016-10-02 NOTE — Telephone Encounter (Signed)
Pt called to office stating she was told she had a placenta previa. Pt states she is now having left sided hip/back pain. Pt denies bleeding at this time. Pt states that she is to start school next week and return to work. Pt would like to know what plan she should have regarding pain. Pt was made aware that no previa was noted at last u/s. Pt does not have f/u scheduled.  Pt was made aware that she can take Tylenol and use heat pack for hip pain. Pt was made that message would be sent to provider for follow up recommendations.  Please advise on u/s, pain control.

## 2016-10-03 ENCOUNTER — Encounter: Payer: Self-pay | Admitting: Certified Nurse Midwife

## 2016-10-04 ENCOUNTER — Other Ambulatory Visit: Payer: Self-pay | Admitting: Certified Nurse Midwife

## 2016-10-04 DIAGNOSIS — Z3480 Encounter for supervision of other normal pregnancy, unspecified trimester: Secondary | ICD-10-CM

## 2016-10-04 DIAGNOSIS — B9689 Other specified bacterial agents as the cause of diseases classified elsewhere: Secondary | ICD-10-CM

## 2016-10-04 DIAGNOSIS — B3731 Acute candidiasis of vulva and vagina: Secondary | ICD-10-CM

## 2016-10-04 DIAGNOSIS — N760 Acute vaginitis: Secondary | ICD-10-CM

## 2016-10-04 DIAGNOSIS — B373 Candidiasis of vulva and vagina: Secondary | ICD-10-CM

## 2016-10-04 DIAGNOSIS — O99820 Streptococcus B carrier state complicating pregnancy: Secondary | ICD-10-CM

## 2016-10-04 DIAGNOSIS — R7989 Other specified abnormal findings of blood chemistry: Secondary | ICD-10-CM

## 2016-10-04 LAB — MATERNIT21 PLUS CORE+SCA
CHROMOSOME 13: NEGATIVE
CHROMOSOME 18: NEGATIVE
Chromosome 21: NEGATIVE
PDF: 0
Y CHROMOSOME: NOT DETECTED

## 2016-10-04 LAB — CYTOLOGY - PAP: DIAGNOSIS: NEGATIVE

## 2016-10-04 MED ORDER — TERCONAZOLE 0.8 % VA CREA: 1.0000 | TOPICAL_CREAM | Freq: Every day | VAGINAL | 0 refills | Status: DC

## 2016-10-04 MED ORDER — VITAMIN D (ERGOCALCIFEROL) 1.25 MG (50000 UNIT) PO CAPS: 50000.0000 [IU] | ORAL_CAPSULE | ORAL | 2 refills | Status: DC

## 2016-10-04 MED ORDER — NITROFURANTOIN MONOHYD MACRO 100 MG PO CAPS: 100.0000 mg | ORAL_CAPSULE | Freq: Two times a day (BID) | ORAL | 0 refills | Status: DC

## 2016-10-04 MED ORDER — METRONIDAZOLE 0.75 % VA GEL: 1.0000 | Freq: Two times a day (BID) | VAGINAL | 0 refills | Status: DC

## 2016-10-04 NOTE — Telephone Encounter (Signed)
Pt made aware of recommendations. Pt also made aware of Materni21 results.

## 2016-10-05 LAB — OBSTETRIC PANEL, INCLUDING HIV
Antibody Screen: NEGATIVE
Basophils Absolute: 0 10*3/uL (ref 0.0–0.2)
Basos: 0 %
EOS (ABSOLUTE): 0.1 10*3/uL (ref 0.0–0.4)
Eos: 1 %
HEMOGLOBIN: 11.8 g/dL (ref 11.1–15.9)
HIV SCREEN 4TH GENERATION: NONREACTIVE
Hematocrit: 35.8 % (ref 34.0–46.6)
Hepatitis B Surface Ag: NEGATIVE
Immature Grans (Abs): 0 10*3/uL (ref 0.0–0.1)
Immature Granulocytes: 0 %
LYMPHS ABS: 3.6 10*3/uL — AB (ref 0.7–3.1)
Lymphs: 34 %
MCH: 24 pg — AB (ref 26.6–33.0)
MCHC: 33 g/dL (ref 31.5–35.7)
MCV: 73 fL — ABNORMAL LOW (ref 79–97)
Monocytes Absolute: 0.9 10*3/uL (ref 0.1–0.9)
Monocytes: 8 %
NEUTROS ABS: 5.9 10*3/uL (ref 1.4–7.0)
Neutrophils: 57 %
PLATELETS: 317 10*3/uL (ref 150–379)
RBC: 4.91 x10E6/uL (ref 3.77–5.28)
RDW: 17.4 % — ABNORMAL HIGH (ref 12.3–15.4)
RH TYPE: POSITIVE
RPR: NONREACTIVE
Rubella Antibodies, IGG: 2.29 index (ref >0.99)
WBC: 10.5 10*3/uL (ref 3.4–10.8)

## 2016-10-05 LAB — HEMOGLOBINOPATHY EVALUATION
HGB C: 0 %
HGB S: 0 %
Hemoglobin A2 Quantitation: 2.2 % (ref 1.8–3.2)
Hemoglobin F Quantitation: 0 % (ref 0.0–2.0)
Hgb A: 97.8 % (ref 96.4–98.8)

## 2016-10-05 LAB — VITAMIN D 25 HYDROXY (VIT D DEFICIENCY, FRACTURES): Vit D, 25-Hydroxy: 12.3 ng/mL — ABNORMAL LOW (ref 30.0–100.0)

## 2016-10-05 LAB — HEMOGLOBIN A1C
Est. average glucose Bld gHb Est-mCnc: 126 mg/dL
Hgb A1c MFr Bld: 6 % — ABNORMAL HIGH (ref 4.8–5.6)

## 2016-10-05 LAB — CYSTIC FIBROSIS MUTATION 97: Interpretation: NOT DETECTED

## 2016-10-05 LAB — VARICELLA ZOSTER ANTIBODY, IGG: VARICELLA: 984 {index} (ref >165)

## 2016-10-05 LAB — TOXASSURE SELECT 13 (MW), URINE

## 2016-10-08 ENCOUNTER — Other Ambulatory Visit: Payer: Self-pay | Admitting: Certified Nurse Midwife

## 2016-10-12 ENCOUNTER — Encounter (HOSPITAL_COMMUNITY): Payer: Self-pay | Admitting: *Deleted

## 2016-10-12 ENCOUNTER — Inpatient Hospital Stay (HOSPITAL_COMMUNITY)
Admission: AD | Admit: 2016-10-12 | Discharge: 2016-10-12 | Disposition: A | Discharge status: Home / Self Care | Payer: Medicaid Other | Source: Ambulatory Visit | Attending: Obstetrics & Gynecology | Admitting: Obstetrics & Gynecology

## 2016-10-12 DIAGNOSIS — Z3A11 11 weeks gestation of pregnancy: Secondary | ICD-10-CM | POA: Diagnosis not present

## 2016-10-12 DIAGNOSIS — Z87891 Personal history of nicotine dependence: Secondary | ICD-10-CM | POA: Insufficient documentation

## 2016-10-12 DIAGNOSIS — O26851 Spotting complicating pregnancy, first trimester: Secondary | ICD-10-CM

## 2016-10-12 DIAGNOSIS — O219 Vomiting of pregnancy, unspecified: Secondary | ICD-10-CM | POA: Diagnosis not present

## 2016-10-12 DIAGNOSIS — Z888 Allergy status to other drugs, medicaments and biological substances status: Secondary | ICD-10-CM | POA: Diagnosis not present

## 2016-10-12 DIAGNOSIS — N939 Abnormal uterine and vaginal bleeding, unspecified: Secondary | ICD-10-CM | POA: Diagnosis present

## 2016-10-12 DIAGNOSIS — O209 Hemorrhage in early pregnancy, unspecified: Secondary | ICD-10-CM

## 2016-10-12 LAB — URINALYSIS, ROUTINE W REFLEX MICROSCOPIC
Bilirubin Urine: NEGATIVE
GLUCOSE, UA: NEGATIVE mg/dL
HGB URINE DIPSTICK: NEGATIVE
KETONES UR: 5 mg/dL — AB
Leukocytes, UA: NEGATIVE
NITRITE: NEGATIVE
PH: 6 (ref 5.0–8.0)
Protein, ur: 30 mg/dL — AB
Specific Gravity, Urine: 1.024 (ref 1.005–1.030)

## 2016-10-12 NOTE — Discharge Instructions (Signed)
Vaginal Bleeding During Pregnancy, First Trimester °A small amount of bleeding (spotting) from the vagina is relatively common in early pregnancy. It usually stops on its own. Various things may cause bleeding or spotting in early pregnancy. Some bleeding may be related to the pregnancy, and some may not. In most cases, the bleeding is normal and is not a problem. However, bleeding can also be a sign of something serious. Be sure to tell your health care provider about any vaginal bleeding right away. °Some possible causes of vaginal bleeding during the first trimester include: °· Infection or inflammation of the cervix. °· Growths (polyps) on the cervix. °· Miscarriage or threatened miscarriage. °· Pregnancy tissue has developed outside of the uterus and in a fallopian tube (tubal pregnancy). °· Tiny cysts have developed in the uterus instead of pregnancy tissue (molar pregnancy). °Follow these instructions at home: °Watch your condition for any changes. The following actions may help to lessen any discomfort you are feeling: °· Follow your health care provider's instructions for limiting your activity. If your health care provider orders bed rest, you may need to stay in bed and only get up to use the bathroom. However, your health care provider may allow you to continue light activity. °· If needed, make plans for someone to help with your regular activities and responsibilities while you are on bed rest. °· Keep track of the number of pads you use each day, how often you change pads, and how soaked (saturated) they are. Write this down. °· Do not use tampons. Do not douche. °· Do not have sexual intercourse or orgasms until approved by your health care provider. °· If you pass any tissue from your vagina, save the tissue so you can show it to your health care provider. °· Only take over-the-counter or prescription medicines as directed by your health care provider. °· Do not take aspirin because it can make you  bleed. °· Keep all follow-up appointments as directed by your health care provider. °Contact a health care provider if: °· You have any vaginal bleeding during any part of your pregnancy. °· You have cramps or labor pains. °· You have a fever, not controlled by medicine. °Get help right away if: °· You have severe cramps in your back or belly (abdomen). °· You pass large clots or tissue from your vagina. °· Your bleeding increases. °· You feel light-headed or weak, or you have fainting episodes. °· You have chills. °· You are leaking fluid or have a gush of fluid from your vagina. °· You pass out while having a bowel movement. °This information is not intended to replace advice given to you by your health care provider. Make sure you discuss any questions you have with your health care provider. °Document Released: 06/27/2005 Document Revised: 02/23/2016 Document Reviewed: 05/25/2013 °Elsevier Interactive Patient Education © 2017 [employer] ° °

## 2016-10-12 NOTE — MAU Note (Addendum)
Earlier today I wiped and saw light pink on tissue. Later wiped and saw small red clot. ? Previa. Did not wear in pad. Just see spotting when I wipe. Been on pelvic rest. Just finished med today for uti

## 2016-10-12 NOTE — MAU Provider Note (Signed)
History     CSN: [id number]  Arrival date and time: 10/12/16 2018   First Provider Initiated Contact with Patient 10/12/16 2151      Chief Complaint  Patient presents with  . Vaginal Bleeding   HPI Metta Clinesikki D Kist is a 31 y.o. [id number] at 2029w5d who presents with vaginal bleeding. Reports episode of pink spotting on toilet paper this evening at 6 pm. Had 1 additional episode of bright red blood with a clot smaller than the size of a pea. No bleeding since then. Denies recent intercourse, abdominal pain, diarrhea, constipation, dysuria, or vaginal discharge. Nausea & vomiting throughout pregnancy. Vomited once this morning. Last Bm this morning; did not strain.   OB History    Gravida Para Term Preterm AB Living   9 3 3   5 2    SAB TAB Ectopic Multiple Live Births   2 3     2       Past Medical History:  Diagnosis Date  . Anemia   . Anxiety    off meds with preg  . Carpal tunnel syndrome   . Chlamydia   . Genital HSV    Last OB: > 5567yr ago; Valtrex 500 mg po prn outbreaks  . GERD (gastroesophageal reflux disease)   . Gestational diabetes    diet controlled  . Headache(784.0)   . IBS (irritable bowel syndrome)   . Infection    UTI  . Mood swings (HCC)   . Panic attack   . Thyroid disease   . Trichomonas     Past Surgical History:  Procedure Laterality Date  . CESAREAN SECTION N/A 05/20/2013   Procedure: CESAREAN SECTION;  Surgeon: [name], MD;  Location: [facility];  Service: Obstetrics;  Laterality: N/A;  . INDUCED ABORTION  2009  . VAGINAL DELIVERY  2007    Family History  Problem Relation Age of Onset  . Depression Mother   . Anxiety disorder Mother   . Breast cancer Other   . Bipolar disorder Brother   . Diabetes Maternal Grandmother   . Cancer Maternal Grandmother   . Hypertension Maternal Grandmother   . Hypertension Paternal Grandmother   . Autism    . Schizophrenia      Social History  Substance Use Topics  . Smoking status: Former Games developermoker  .  Smokeless tobacco: Never Used     Comment: was not a daily smoker, tried it  . Alcohol use No     Comment: Twice a month    Allergies:  Allergies  Allergen Reactions  . Zofran [Ondansetron Hcl] Nausea And Vomiting    Prescriptions Prior to Admission  Medication Sig Dispense Refill Last Dose  . Prenat-FeCbn-FeAspGl-FA-Omega (OB COMPLETE PETITE) 35-5-1-200 MG CAPS Take 1 tablet by mouth daily. 30 capsule 12 10/11/2016 at Unknown time  . Vitamin D, Ergocalciferol, (DRISDOL) 50000 units CAPS capsule Take 1 capsule (50,000 Units total) by mouth every 7 (seven) days. 30 capsule 2 10/12/2016 at Unknown time  . Doxylamine-Pyridoxine (DICLEGIS) 10-10 MG TBEC Take 1 tablet with breakfast and lunch.  Take 2 tablets at bedtime. (Patient not taking: Reported on 10/12/2016) 100 tablet 4 Not Taking at Unknown time  . metroNIDAZOLE (METROGEL VAGINAL) 0.75 % vaginal gel Place 1 Applicatorful vaginally 2 (two) times daily. (Patient not taking: Reported on 10/12/2016) 70 g 0 Completed Course at Unknown time  . nitrofurantoin, macrocrystal-monohydrate, (MACROBID) 100 MG capsule Take 1 capsule (100 mg total) by mouth 2 (two) times daily. (Patient not taking: Reported  on 10/12/2016) 14 capsule 0 Completed Course at Unknown time  . terconazole (TERAZOL 3) 0.8 % vaginal cream Place 1 applicator vaginally at bedtime. (Patient not taking: Reported on 10/12/2016) 20 g 0 Completed Course at Unknown time  . valACYclovir (VALTREX) 1000 MG tablet Take 0.5 tablets (500 mg total) by mouth daily. (Patient not taking: Reported on 10/12/2016) 30 tablet 3 Not Taking at Unknown time    Review of Systems  Constitutional: Negative.   Gastrointestinal: Positive for nausea and vomiting. Negative for abdominal pain, constipation and diarrhea.  Genitourinary: Positive for vaginal bleeding. Negative for dysuria, hematuria and vaginal discharge.   Physical Exam   Blood pressure 127/71, pulse 100, temperature 98.3 F (36.8 C), resp. rate  18, height 5' 7.5" (1.715 m), weight 244 lb (110.7 kg), last menstrual period 07/22/2016.  Physical Exam  Nursing note and vitals reviewed. Constitutional: She is oriented to person, place, and time. She appears well-developed and well-nourished. No distress.  HENT:  Head: Normocephalic and atraumatic.  Eyes: Conjunctivae are normal. Right eye exhibits no discharge. Left eye exhibits no discharge. No scleral icterus.  Neck: Normal range of motion.  Respiratory: Effort normal. No respiratory distress.  GI: Soft. There is no tenderness.  Genitourinary: Cervix exhibits no friability. No bleeding in the vagina. Vaginal discharge (small amount of thin yellow discharge) found.  Genitourinary Comments: Cervix closed  Neurological: She is alert and oriented to person, place, and time.  Skin: Skin is warm and dry. She is not diaphoretic.  Psychiatric: She has a normal mood and affect. Her behavior is normal. Judgment and thought content normal.    MAU Course  Procedures Results for orders placed or performed during the hospital encounter of 10/12/16 (from the past 24 hour(s))  Urinalysis, Routine w reflex microscopic     Status: Abnormal   Collection Time: 10/12/16  8:47 PM  Result Value Ref Range   Color, Urine YELLOW YELLOW   APPearance HAZY (A) CLEAR   Specific Gravity, Urine 1.024 1.005 - 1.030   pH 6.0 5.0 - 8.0   Glucose, UA NEGATIVE NEGATIVE mg/dL   Hgb urine dipstick NEGATIVE NEGATIVE   Bilirubin Urine NEGATIVE NEGATIVE   Ketones, ur 5 (A) NEGATIVE mg/dL   Protein, ur 30 (A) NEGATIVE mg/dL   Nitrite NEGATIVE NEGATIVE   Leukocytes, UA NEGATIVE NEGATIVE   RBC / HPF 0-5 0 - 5 RBC/hpf   WBC, UA 0-5 0 - 5 WBC/hpf   Bacteria, UA RARE (A) NONE SEEN   Squamous Epithelial / LPF 0-5 (A) NONE SEEN   Mucous PRESENT     MDM FHT 160 by doppler No blood on exam Cervix closed A positive Assessment and Plan  A; 1. Vaginal bleeding in pregnancy, first trimester    P; Discharge  home Keep f/u with OB Discussed reasons to return to MAU Pelvic rest x 1 week after bleeding stops  Judeth Horn 10/12/2016, 9:50 PM

## 2016-10-12 NOTE — MAU Note (Signed)
PT SAYS SHE HAD LIGHT  SPOTTING  WHEN  SHE  WIPED AT  6PM.     IN MAU  WITH UA COLLECTION-   NOTHING WHEN SHE  WIPED.      [facility]  TOLD  PT  SHE  MIGHT  HAVE  PREVIA.        LAST SEX-  09-25-2016.     HAS   CRAMPS  SOMETIMES.

## 2016-10-25 ENCOUNTER — Encounter: Payer: Medicaid Other | Admitting: Certified Nurse Midwife

## 2016-10-25 ENCOUNTER — Other Ambulatory Visit: Payer: Medicaid Other

## 2016-10-25 ENCOUNTER — Ambulatory Visit (INDEPENDENT_AMBULATORY_CARE_PROVIDER_SITE_OTHER): Payer: Medicaid Other | Admitting: Certified Nurse Midwife

## 2016-10-25 VITALS — BP 113/77 | HR 95 | Wt 245.4 lb

## 2016-10-25 DIAGNOSIS — O26892 Other specified pregnancy related conditions, second trimester: Secondary | ICD-10-CM

## 2016-10-25 DIAGNOSIS — K219 Gastro-esophageal reflux disease without esophagitis: Secondary | ICD-10-CM

## 2016-10-25 DIAGNOSIS — O219 Vomiting of pregnancy, unspecified: Secondary | ICD-10-CM | POA: Diagnosis not present

## 2016-10-25 DIAGNOSIS — Z98891 History of uterine scar from previous surgery: Secondary | ICD-10-CM

## 2016-10-25 DIAGNOSIS — R51 Headache: Secondary | ICD-10-CM

## 2016-10-25 DIAGNOSIS — O99820 Streptococcus B carrier state complicating pregnancy: Secondary | ICD-10-CM | POA: Diagnosis not present

## 2016-10-25 DIAGNOSIS — O98312 Other infections with a predominantly sexual mode of transmission complicating pregnancy, second trimester: Secondary | ICD-10-CM

## 2016-10-25 DIAGNOSIS — F418 Other specified anxiety disorders: Secondary | ICD-10-CM

## 2016-10-25 DIAGNOSIS — O34219 Maternal care for unspecified type scar from previous cesarean delivery: Secondary | ICD-10-CM | POA: Diagnosis not present

## 2016-10-25 DIAGNOSIS — Z3490 Encounter for supervision of normal pregnancy, unspecified, unspecified trimester: Secondary | ICD-10-CM

## 2016-10-25 DIAGNOSIS — A6009 Herpesviral infection of other urogenital tract: Secondary | ICD-10-CM | POA: Diagnosis not present

## 2016-10-25 LAB — GLUCOSE, POCT (MANUAL RESULT ENTRY): POC Glucose: 158 mg/dl — AB (ref 70–99)

## 2016-10-25 MED ORDER — OMEPRAZOLE 20 MG PO CPDR: 20.0000 mg | DELAYED_RELEASE_CAPSULE | Freq: Two times a day (BID) | ORAL | 5 refills | Status: DC

## 2016-10-25 MED ORDER — FLUOXETINE HCL 20 MG PO CAPS: 20.0000 mg | ORAL_CAPSULE | Freq: Every day | ORAL | 3 refills | Status: DC

## 2016-10-25 MED ORDER — PROCHLORPERAZINE MALEATE 10 MG PO TABS: 10.0000 mg | ORAL_TABLET | Freq: Four times a day (QID) | ORAL | 0 refills | Status: DC | PRN

## 2016-10-25 MED ORDER — PROCHLORPERAZINE 25 MG RE SUPP: 25.0000 mg | Freq: Two times a day (BID) | RECTAL | 0 refills | Status: DC | PRN

## 2016-10-25 MED ORDER — BUTALBITAL-APAP-CAFFEINE 50-325-40 MG PO TABS: 1.0000 | ORAL_TABLET | Freq: Four times a day (QID) | ORAL | 4 refills | Status: DC | PRN

## 2016-10-25 MED ORDER — METOCLOPRAMIDE HCL 10 MG PO TABS: 10.0000 mg | ORAL_TABLET | Freq: Four times a day (QID) | ORAL | 2 refills | Status: DC

## 2016-10-25 NOTE — Progress Notes (Signed)
Pt c/o Ha's. She admits to not wearing her glasses like she should. Tylenol works sometimes. Pt did not fast and was unable to complete 2 gtt today.

## 2016-10-25 NOTE — Progress Notes (Signed)
PRENATAL VISIT NOTE  Subjective:  Melinda Jimenez is a 31 y.o. [id number] at 437w4d being seen today for ongoing prenatal care.  She is currently monitored for the following issues for this low-risk pregnancy and has Herpes genitalis in women; Goiter, unspecified; Vitamin D deficiency; CARPAL TUNNEL SYNDROME; GERD without esophagitis; Anxiety and depression; Supervision of normal pregnancy, antepartum; H/O: cesarean section; and GBS (group B Streptococcus carrier), +RV culture, currently pregnant on her problem list.  Patient reports nausea, no bleeding, no contractions, no cramping, no leaking, vomiting and anxiety, GERD symptoms, N&V on a daily basis, states that she cannot eat anything with sugar.  Ate a full meal this morning.  CBG random: 151.Marland Kitchen.  Contractions: Not present. Vag. Bleeding: None.  Movement: Present. Denies leaking of fluid.   The following portions of the patient's history were reviewed and updated as appropriate: allergies, current medications, past family history, past medical history, past social history, past surgical history and problem list. Problem list updated.  Objective:   Vitals:   10/25/16 0917  BP: 113/77  Pulse: 95  Weight: 245 lb 6.4 oz (111.3 kg)    Fetal Status: Fetal Heart Rate (bpm): 152   Movement: Present     General:  Alert, oriented and cooperative. Patient is in no acute distress.  Skin: Skin is warm and dry. No rash noted.   Cardiovascular: Normal heart rate noted  Respiratory: Normal respiratory effort, no problems with respiration noted  Abdomen: Soft, gravid, appropriate for gestational age. Pain/Pressure: Present     Pelvic:  Cervical exam deferred        Extremities: Normal range of motion.  Edema: None  Mental Status: Normal mood and affect. Normal behavior. Normal judgment and thought content.   Assessment and Plan:  Pregnancy: [id number] at 227w4d  1. Encounter for supervision of normal pregnancy, antepartum, unspecified gravidity    Hx of  GDM, current elevated A1C: early 2 hour OGTT recommended.   - POCT Glucose (CBG): 151  2. Headache in pregnancy, antepartum, second trimester     Tylenol OTC not helping, has HA every time that she has emesis.   - butalbital-acetaminophen-caffeine (FIORICET, ESGIC) 50-325-40 MG tablet; Take 1-2 tablets by mouth every 6 (six) hours as needed.  Dispense: 45 tablet; Refill: 4  3. GERD without esophagitis    - omeprazole (PRILOSEC) 20 MG capsule; Take 1 capsule (20 mg total) by mouth 2 (two) times daily before a meal.  Dispense: 60 capsule; Refill: 5  4. Herpes genitalis in women     Valtrex @36  weeks  5. GBS (group B Streptococcus carrier), +RV culture, currently pregnant      PCN @ delivery  6. H/O: cesarean section    Desires TOLAC, consent for VBAC on file under media tab.    7. Nausea and vomiting in pregnancy prior to [redacted] weeks gestation   Allergic to Zofran  - prochlorperazine (COMPAZINE) 10 MG tablet; Take 1 tablet (10 mg total) by mouth every 6 (six) hours as needed for nausea or vomiting.  Dispense: 30 tablet; Refill: 0 - prochlorperazine (COMPAZINE) 25 MG suppository; Place 1 suppository (25 mg total) rectally every 12 (twelve) hours as needed for nausea or vomiting.  Dispense: 12 suppository; Refill: 0 - metoCLOPramide (REGLAN) 10 MG tablet; Take 1 tablet (10 mg total) by mouth 4 (four) times daily.  Dispense: 120 tablet; Refill: 2  8. Depression with anxiety Has a hx of depression and anxiety, not currently in counseling.    BHC done.    -  FLUoxetine (PROZAC) 20 MG capsule; Take 1 capsule (20 mg total) by mouth daily.  Dispense: 30 capsule; Refill: 3  Preterm labor symptoms and general obstetric precautions including but not limited to vaginal bleeding, contractions, leaking of fluid and fetal movement were reviewed in detail with the patient. Please refer to After Visit Summary for other counseling recommendations.  Return in about 4 weeks (around 11/22/2016) for reschedule  2 hr OGTT.   Roe Coombs, CNM

## 2016-10-31 ENCOUNTER — Other Ambulatory Visit: Payer: Medicaid Other

## 2016-11-02 ENCOUNTER — Institutional Professional Consult (permissible substitution): Payer: Medicaid Other

## 2016-11-02 NOTE — BH Specialist Note (Deleted)
Session Start time: ***   End Time: *** Total Time:  *** Type of Service: Behavioral Health - Individual/Family Interpreter: No.   Interpreter Name & Language: n/a # [facility]-June [facility]  SUBJECTIVE: Melinda Jimenez is a 31 y.o. female  Pt. was referred by Dr Clearance CootsHarper for:  anxiety and depression. Pt. reports the following symptoms/concerns: *** Duration of problem:  *** Severity: {DESC;mild/moderate/severe:33302} Previous treatment: ***  OBJECTIVE: Mood: {BHH MOOD:22306} & Affect: {BHH AFFECT:22307} Risk of harm to self or others: *** Assessments administered: PHQ9: ***/ GAD7: ***  LIFE CONTEXT:  Family & Social: ***  School/ Work: ***  Self-Care: ***  Life changes: *** What is important to pt/family (values): ***  GOALS ADDRESSED:  -Reduce symptoms of anxiety and depression  INTERVENTIONS: {CHL AMB BH Type of Intervention:[id number]}   ASSESSMENT:  Pt currently experiencing ***.  Pt may benefit from psychoeducation and brief therapeutic interventions regarding coping with symptoms of anxiety and depression.   PLAN: 1. F/U with behavioral health clinician: *** 2. Behavioral Health meds: Prozac 3. Behavioral recommendations: *** 4. Referral: {BH Clinician Interventions:[phone]} 5. From scale of 1-10, how likely are you to follow plan: ***  Woc-Behavioral Health Clinician  Behavioral Health Clinician  Marlon PelWarmhandoff: no

## 2016-11-07 ENCOUNTER — Inpatient Hospital Stay (HOSPITAL_COMMUNITY)
Admission: AD | Admit: 2016-11-07 | Discharge: 2016-11-07 | Disposition: A | Discharge status: Home / Self Care | Payer: Medicaid Other | Source: Ambulatory Visit | Attending: Obstetrics & Gynecology | Admitting: Obstetrics & Gynecology

## 2016-11-07 DIAGNOSIS — Z3A15 15 weeks gestation of pregnancy: Secondary | ICD-10-CM | POA: Diagnosis not present

## 2016-11-07 DIAGNOSIS — O99612 Diseases of the digestive system complicating pregnancy, second trimester: Secondary | ICD-10-CM | POA: Insufficient documentation

## 2016-11-07 DIAGNOSIS — R509 Fever, unspecified: Secondary | ICD-10-CM | POA: Diagnosis not present

## 2016-11-07 DIAGNOSIS — O26892 Other specified pregnancy related conditions, second trimester: Secondary | ICD-10-CM | POA: Diagnosis not present

## 2016-11-07 DIAGNOSIS — O99282 Endocrine, nutritional and metabolic diseases complicating pregnancy, second trimester: Secondary | ICD-10-CM | POA: Insufficient documentation

## 2016-11-07 DIAGNOSIS — Z20828 Contact with and (suspected) exposure to other viral communicable diseases: Secondary | ICD-10-CM

## 2016-11-07 DIAGNOSIS — R05 Cough: Secondary | ICD-10-CM | POA: Insufficient documentation

## 2016-11-07 DIAGNOSIS — R6889 Other general symptoms and signs: Secondary | ICD-10-CM

## 2016-11-07 DIAGNOSIS — Z3490 Encounter for supervision of normal pregnancy, unspecified, unspecified trimester: Secondary | ICD-10-CM

## 2016-11-07 DIAGNOSIS — O219 Vomiting of pregnancy, unspecified: Secondary | ICD-10-CM

## 2016-11-07 MED ORDER — PROMETHAZINE HCL 25 MG PO TABS: 12.5000 mg | ORAL_TABLET | Freq: Four times a day (QID) | ORAL | 0 refills | Status: DC | PRN

## 2016-11-07 MED ORDER — OSELTAMIVIR PHOSPHATE 75 MG PO CAPS
75.0000 mg | ORAL_CAPSULE | Freq: Two times a day (BID) | ORAL | 0 refills | Status: DC
Start: 2016-11-07 — End: 2016-12-13

## 2016-11-07 NOTE — MAU Provider Note (Signed)
Chief Complaint: influenza symptoms   First Provider Initiated Contact with Patient 11/07/16 1809      SUBJECTIVE HPI: Melinda Jimenez is a 31 y.o. [id number] at 5817w3d by LMP who presents to maternity admissions reporting flu like symptoms with fever/chills, cough, body aches, sneezing and nausea with vomiting 3-4x/day.  All symptoms started yesterday and are rapidly worsening today. This is a new symptom.  Her 2 children both tested positive for influenza and are being treated with Tamiflu.  She has Zofran at home but has not taken it, and prefers Phenergan for nausea to help her rest.  She is trying to drink fluids but nausea is making that difficult. She has tried some OTC cold medicine but her symptoms are not improved.   She denies vaginal bleeding, vaginal itching/burning, urinary symptoms, h/a, or dizziness.   HPI  Past Medical History:  Diagnosis Date  . Anemia   . Anxiety    off meds with preg  . Carpal tunnel syndrome   . Chlamydia   . Genital HSV    Last OB: > 8825yr ago; Valtrex 500 mg po prn outbreaks  . GERD (gastroesophageal reflux disease)   . Gestational diabetes    diet controlled  . Headache(784.0)   . IBS (irritable bowel syndrome)   . Infection    UTI  . Mood swings (HCC)   . Panic attack   . Thyroid disease   . Trichomonas    Past Surgical History:  Procedure Laterality Date  . CESAREAN SECTION N/A 05/20/2013   Procedure: CESAREAN SECTION;  Surgeon: [name], MD;  Location: [facility];  Service: Obstetrics;  Laterality: N/A;  . INDUCED ABORTION  2009  . VAGINAL DELIVERY  2007   Social History   Social History  . Marital status: Single    Spouse name: N/A  . Number of children: 1  . Years of education: N/A   Occupational History  . Unemployed    Social History Main Topics  . Smoking status: Former Games developermoker  . Smokeless tobacco: Never Used     Comment: was not a daily smoker, tried it  . Alcohol use No     Comment: Twice a month  . Drug use: No   . Sexual activity: Not on file   Other Topics Concern  . Not on file   Social History Narrative  . No narrative on file   No current facility-administered medications on file prior to encounter.    Current Outpatient Prescriptions on File Prior to Encounter  Medication Sig Dispense Refill  . acetaminophen (TYLENOL) 325 MG tablet Take 325 mg by mouth every 6 (six) hours as needed.    . butalbital-acetaminophen-caffeine (FIORICET, ESGIC) 50-325-40 MG tablet Take 1-2 tablets by mouth every 6 (six) hours as needed. 45 tablet 4  . FLUoxetine (PROZAC) 20 MG capsule Take 1 capsule (20 mg total) by mouth daily. 30 capsule 3  . omeprazole (PRILOSEC) 20 MG capsule Take 1 capsule (20 mg total) by mouth 2 (two) times daily before a meal. 60 capsule 5   Allergies  Allergen Reactions  . Zofran [Ondansetron Hcl] Nausea And Vomiting    ROS:  Review of Systems  Constitutional: Positive for chills, fatigue and fever.  HENT: Positive for congestion, rhinorrhea and sneezing.   Respiratory: Negative for shortness of breath.   Cardiovascular: Negative for chest pain.  Gastrointestinal: Positive for abdominal pain.  Genitourinary: Negative for difficulty urinating, dysuria, flank pain, pelvic pain, vaginal bleeding, vaginal discharge and  vaginal pain.  Musculoskeletal: Positive for myalgias.  Neurological: Negative for dizziness and headaches.  Psychiatric/Behavioral: Negative.      I have reviewed patient's Past Medical Hx, Surgical Hx, Family Hx, Social Hx, medications and allergies.   Physical Exam   Patient Vitals for the past 24 hrs:  SpO2  11/07/16 1759 97 %   Temperature 99.3 per RN  Constitutional: Well-developed, well-nourished female in no acute distress.  Cardiovascular: normal rate Respiratory: normal effort GI: Abd soft, non-tender. Pos BS x 4 MS: Extremities nontender, no edema, normal ROM Neurologic: Alert and oriented x 4.  GU:    FHT present by doppler per RN  LAB  RESULTS No results found for this or any previous visit (from the past 24 hour(s)).  A/Positive/-- (12/28 1342)  IMAGING No results found.  MAU Management/MDM: Evaluated pt in triage area.  With known exposure to influenza and flu-like symptoms in pregnancy, will treat with Tamiflu 75 mg PO BID x 5 days.  Tylenol OTC PRN for fever and Phenergan 12.5-25 mg PO Q 6 hours PRN for nausea.  Increase PO fluids.  F/U with prenatal care as scheduled, return to MAU for emergencies.  Pt stable at time of discharge.  ASSESSMENT 1. Flu-like symptoms   2. Encounter for supervision of normal pregnancy, antepartum, unspecified gravidity   3. Exposure to the flu   4. Nausea and vomiting during pregnancy prior to [redacted] weeks gestation     PLAN Discharge home   Follow-up Information    [facility] Follow up.   Specialty:  Obstetrics and Gynecology Why:  As scheduled, return to MAU as needed for emergencies Contact information: [address] [place] [address] [phone]          Sharen Counter Certified Nurse-Midwife 11/07/2016  7:38 PM

## 2016-11-08 ENCOUNTER — Other Ambulatory Visit: Payer: Medicaid Other

## 2016-11-15 ENCOUNTER — Telehealth: Payer: Self-pay

## 2016-11-15 ENCOUNTER — Other Ambulatory Visit: Payer: Self-pay | Admitting: Certified Nurse Midwife

## 2016-11-15 DIAGNOSIS — O4692 Antepartum hemorrhage, unspecified, second trimester: Secondary | ICD-10-CM

## 2016-11-15 DIAGNOSIS — Z3480 Encounter for supervision of other normal pregnancy, unspecified trimester: Secondary | ICD-10-CM

## 2016-11-15 DIAGNOSIS — Z98891 History of uterine scar from previous surgery: Secondary | ICD-10-CM

## 2016-11-19 ENCOUNTER — Telehealth: Payer: Self-pay

## 2016-11-19 NOTE — Telephone Encounter (Signed)
Called patient to advise patient that requested letter is at front desk, no answer, left vm

## 2016-11-19 NOTE — Telephone Encounter (Signed)
Patient called in stating that she needs a letter excusing her from court in relation to issues that she has been having during this pregnancy. Advised that I would check with provider and follow up with her.

## 2016-11-20 ENCOUNTER — Encounter (HOSPITAL_COMMUNITY): Payer: Self-pay | Admitting: Certified Nurse Midwife

## 2016-11-22 ENCOUNTER — Ambulatory Visit (INDEPENDENT_AMBULATORY_CARE_PROVIDER_SITE_OTHER): Payer: Medicaid Other | Admitting: Certified Nurse Midwife

## 2016-11-22 VITALS — BP 113/75 | HR 105 | Wt 249.0 lb

## 2016-11-22 DIAGNOSIS — O34219 Maternal care for unspecified type scar from previous cesarean delivery: Secondary | ICD-10-CM

## 2016-11-22 DIAGNOSIS — Z3482 Encounter for supervision of other normal pregnancy, second trimester: Secondary | ICD-10-CM

## 2016-11-22 DIAGNOSIS — O99820 Streptococcus B carrier state complicating pregnancy: Secondary | ICD-10-CM | POA: Diagnosis not present

## 2016-11-22 DIAGNOSIS — Z3480 Encounter for supervision of other normal pregnancy, unspecified trimester: Secondary | ICD-10-CM

## 2016-11-22 DIAGNOSIS — Z98891 History of uterine scar from previous surgery: Secondary | ICD-10-CM

## 2016-11-22 NOTE — Progress Notes (Signed)
   PRENATAL VISIT NOTE  Subjective:  Melinda Jimenez is a 31 y.o. [id number] at 3196w4d being seen today for ongoing prenatal care.  She is currently monitored for the following issues for this low-risk pregnancy and has Herpes genitalis in women; Goiter, unspecified; Vitamin D deficiency; CARPAL TUNNEL SYNDROME; GERD without esophagitis; Anxiety and depression; Supervision of normal pregnancy, antepartum; H/O: cesarean section; and GBS (group B Streptococcus carrier), +RV culture, currently pregnant on her problem list.  Patient reports backache, no bleeding, no contractions, no cramping and no leaking.  Contractions: Irritability. Vag. Bleeding: None.  Movement: Present. Denies leaking of fluid.   The following portions of the patient's history were reviewed and updated as appropriate: allergies, current medications, past family history, past medical history, past social history, past surgical history and problem list. Problem list updated.  Objective:   Vitals:   11/22/16 1540  BP: 113/75  Pulse: (!) 105  Weight: 249 lb (112.9 kg)    Fetal Status: Fetal Heart Rate (bpm): 146   Movement: Present     General:  Alert, oriented and cooperative. Patient is in no acute distress.  Skin: Skin is warm and dry. No rash noted.   Cardiovascular: Normal heart rate noted  Respiratory: Normal respiratory effort, no problems with respiration noted  Abdomen: Soft, gravid, appropriate for gestational age. Pain/Pressure: Present     Pelvic:  Cervical exam deferred        Extremities: Normal range of motion.     Mental Status: Normal mood and affect. Normal behavior. Normal judgment and thought content.   Assessment and Plan:  Pregnancy: [id number] at 3996w4d  1. H/O: cesarean section     TOLAC: signed 09/27/16  2. GBS (group B Streptococcus carrier), +RV culture, currently pregnant     PCN for delivery  3. Supervision of other normal pregnancy, antepartum     Elevated A1C: early 2 hour OGTT ordered -  AFP, Serum, Open Spina Bifida  Preterm labor symptoms and general obstetric precautions including but not limited to vaginal bleeding, contractions, leaking of fluid and fetal movement were reviewed in detail with the patient. Please refer to After Visit Summary for other counseling recommendations.  Return in about 4 weeks (around 12/20/2016) for ROB, schedule early 2 hr OGTT asap.   Roe Coombsachelle A Denney, CNM

## 2016-11-26 ENCOUNTER — Other Ambulatory Visit: Payer: Medicaid Other

## 2016-11-26 DIAGNOSIS — Z3492 Encounter for supervision of normal pregnancy, unspecified, second trimester: Secondary | ICD-10-CM

## 2016-11-27 ENCOUNTER — Other Ambulatory Visit: Payer: Self-pay | Admitting: Certified Nurse Midwife

## 2016-11-27 DIAGNOSIS — O0990 Supervision of high risk pregnancy, unspecified, unspecified trimester: Secondary | ICD-10-CM

## 2016-11-27 DIAGNOSIS — O24419 Gestational diabetes mellitus in pregnancy, unspecified control: Secondary | ICD-10-CM

## 2016-11-27 LAB — GLUCOSE TOLERANCE, 2 HOURS W/ 1HR
GLUCOSE, 1 HOUR: 221 mg/dL — AB (ref 65–179)
Glucose, 2 hour: 174 mg/dL — ABNORMAL HIGH (ref 65–152)
Glucose, Fasting: 119 mg/dL — ABNORMAL HIGH (ref 65–91)

## 2016-11-28 ENCOUNTER — Other Ambulatory Visit: Payer: Self-pay | Admitting: *Deleted

## 2016-11-28 ENCOUNTER — Other Ambulatory Visit: Payer: Self-pay | Admitting: Certified Nurse Midwife

## 2016-11-28 DIAGNOSIS — O24419 Gestational diabetes mellitus in pregnancy, unspecified control: Secondary | ICD-10-CM

## 2016-11-28 DIAGNOSIS — O219 Vomiting of pregnancy, unspecified: Secondary | ICD-10-CM

## 2016-11-28 MED ORDER — PROCHLORPERAZINE MALEATE 10 MG PO TABS: 10.0000 mg | ORAL_TABLET | Freq: Four times a day (QID) | ORAL | 0 refills | Status: DC | PRN

## 2016-11-28 MED ORDER — DOXYLAMINE-PYRIDOXINE 10-10 MG PO TBEC: DELAYED_RELEASE_TABLET | ORAL | 4 refills | Status: DC

## 2016-11-28 MED ORDER — BLOOD GLUCOSE METER KIT: PACK | 99 refills | Status: DC

## 2016-11-29 ENCOUNTER — Other Ambulatory Visit: Payer: Self-pay

## 2016-11-30 ENCOUNTER — Other Ambulatory Visit: Payer: Self-pay | Admitting: Certified Nurse Midwife

## 2016-11-30 DIAGNOSIS — O0990 Supervision of high risk pregnancy, unspecified, unspecified trimester: Secondary | ICD-10-CM

## 2016-11-30 LAB — AFP, SERUM, OPEN SPINA BIFIDA
AFP MoM: 1.01
AFP Value: 33.5 ng/mL
Gest. Age on Collection Date: 17.6 weeks
Maternal Age At EDD: 29 years
OSBR RISK 1 IN: 10000
TEST RESULTS AFP: NEGATIVE
WEIGHT: 249 [lb_av]

## 2016-12-03 ENCOUNTER — Other Ambulatory Visit: Payer: Self-pay | Admitting: Certified Nurse Midwife

## 2016-12-03 ENCOUNTER — Ambulatory Visit (HOSPITAL_COMMUNITY)
Admission: RE | Admit: 2016-12-03 | Discharge: 2016-12-03 | Disposition: A | Discharge status: Home / Self Care | Payer: Medicaid Other | Source: Ambulatory Visit | Attending: Certified Nurse Midwife | Admitting: Certified Nurse Midwife

## 2016-12-03 DIAGNOSIS — Z3689 Encounter for other specified antenatal screening: Secondary | ICD-10-CM | POA: Diagnosis not present

## 2016-12-03 DIAGNOSIS — Z3A19 19 weeks gestation of pregnancy: Secondary | ICD-10-CM | POA: Diagnosis not present

## 2016-12-03 DIAGNOSIS — O4692 Antepartum hemorrhage, unspecified, second trimester: Secondary | ICD-10-CM

## 2016-12-03 DIAGNOSIS — O34211 Maternal care for low transverse scar from previous cesarean delivery: Secondary | ICD-10-CM | POA: Insufficient documentation

## 2016-12-03 DIAGNOSIS — O24410 Gestational diabetes mellitus in pregnancy, diet controlled: Secondary | ICD-10-CM | POA: Diagnosis not present

## 2016-12-03 DIAGNOSIS — O99212 Obesity complicating pregnancy, second trimester: Secondary | ICD-10-CM | POA: Diagnosis not present

## 2016-12-03 DIAGNOSIS — Z3480 Encounter for supervision of other normal pregnancy, unspecified trimester: Secondary | ICD-10-CM

## 2016-12-03 DIAGNOSIS — Z98891 History of uterine scar from previous surgery: Secondary | ICD-10-CM

## 2016-12-04 ENCOUNTER — Telehealth: Payer: Self-pay

## 2016-12-04 DIAGNOSIS — R253 Fasciculation: Secondary | ICD-10-CM

## 2016-12-04 MED ORDER — CYCLOBENZAPRINE HCL 10 MG PO TABS: 10.0000 mg | ORAL_TABLET | Freq: Four times a day (QID) | ORAL | 0 refills | Status: DC | PRN

## 2016-12-04 NOTE — Telephone Encounter (Signed)
Returned call and patient stated that she has been experiencing anxiety because her grandmother recently died and has been having uncontrollable twitching in shoulders. Patient states that she would like something for anxiety. Also advised patient that she has diabetic counseling app on 3-8 and needs to pick up supplies from pharmacy. Sent rx per provider order.

## 2016-12-06 ENCOUNTER — Ambulatory Visit (HOSPITAL_COMMUNITY): Admission: RE | Admit: 2016-12-06 | Payer: Medicaid Other | Source: Ambulatory Visit

## 2016-12-13 ENCOUNTER — Inpatient Hospital Stay (HOSPITAL_COMMUNITY)
Admission: AD | Admit: 2016-12-13 | Discharge: 2016-12-13 | Disposition: A | Discharge status: Home / Self Care | Payer: Medicaid Other | Source: Ambulatory Visit | Attending: Obstetrics & Gynecology | Admitting: Obstetrics & Gynecology

## 2016-12-13 DIAGNOSIS — R1030 Lower abdominal pain, unspecified: Secondary | ICD-10-CM | POA: Insufficient documentation

## 2016-12-13 DIAGNOSIS — Z87891 Personal history of nicotine dependence: Secondary | ICD-10-CM | POA: Diagnosis not present

## 2016-12-13 DIAGNOSIS — O0990 Supervision of high risk pregnancy, unspecified, unspecified trimester: Secondary | ICD-10-CM

## 2016-12-13 DIAGNOSIS — R109 Unspecified abdominal pain: Secondary | ICD-10-CM | POA: Diagnosis not present

## 2016-12-13 DIAGNOSIS — Z3A20 20 weeks gestation of pregnancy: Secondary | ICD-10-CM | POA: Diagnosis not present

## 2016-12-13 DIAGNOSIS — O26892 Other specified pregnancy related conditions, second trimester: Secondary | ICD-10-CM

## 2016-12-13 LAB — URINALYSIS, ROUTINE W REFLEX MICROSCOPIC
BACTERIA UA: NONE SEEN
Bilirubin Urine: NEGATIVE
Glucose, UA: 50 mg/dL — AB
HGB URINE DIPSTICK: NEGATIVE
Ketones, ur: NEGATIVE mg/dL
Leukocytes, UA: NEGATIVE
NITRITE: NEGATIVE
Protein, ur: 30 mg/dL — AB
SPECIFIC GRAVITY, URINE: 1.028 (ref 1.005–1.030)
pH: 5 (ref 5.0–8.0)

## 2016-12-13 NOTE — MAU Note (Signed)
Pt stated she started having abd cramping since yesterday. Pain is worse. C/o increased pelvic pressure. Denies vag discharge or bleeding. Good fetal movement felt.

## 2016-12-13 NOTE — MAU Provider Note (Signed)
Obstetric Attending MAU Note  Chief Complaint:  Abdominal Pain   First Provider Initiated Contact with Patient 12/13/16 1414     HPI: [name] is a 31 y.o. [id number] at 5w4dwho presents to maternity admissions reporting lower abdominal cramping since yesterday. Pain is in a band-like distribution across mid abdomen, radiating to bilateral groin areas, back and legs.  Denies contractions, leakage of fluid or vaginal bleeding. Reports good fetal movement. Denies any abnormal vaginal discharge, fevers, chills, sweats, dysuria, nausea, vomiting, other GI or GU symptoms or other general symptoms.  [facility]   Diagnosis Date Noted  . Gestational diabetes mellitus (GDM) in second trimester 11/27/2016  . GBS (group B Streptococcus carrier), +RV culture, currently pregnant 10/04/2016  . Supervision of high risk pregnancy, antepartum 09/27/2016  . H/O: cesarean section 09/27/2016  . GERD without esophagitis 01/20/2013  . Anxiety and depression 01/20/2013  . CARPAL TUNNEL SYNDROME 06/02/2010  . Vitamin D deficiency 04/19/2010  . Herpes genitalis in women 04/18/2010  . Goiter, unspecified 04/18/2010    Past Medical History:  Diagnosis Date  . Anemia   . Anxiety    off meds with preg  . Carpal tunnel syndrome   . Chlamydia   . Genital HSV    Last OB: > 111yrgo; Valtrex 500 mg po prn outbreaks  . GERD (gastroesophageal reflux disease)   . Gestational diabetes    diet controlled  . Headache(784.0)   . IBS (irritable bowel syndrome)   . Infection    UTI  . Mood swings ([place]  . Panic attack   . Thyroid disease   . Trichomonas     OB History  Gravida Para Term Preterm AB Living  '9 3 3   5 2  ' SAB TAB Ectopic Multiple Live Births  '2 3     2    ' # Outcome Date GA Lbr Len/2nd Weight Sex Delivery Anes PTL Lv  9 Current           8 Term 05/20/13 37109w3d lb 4 oz (2.835 kg) M CS-LTranv Spinal, EPI       Birth Comments:  C-cestion due to active HSV lesions at time of delivery  7 Term 2007 40w20w0d Vag-Spont EPI  LIV  6 Term 2005 40w06w0dVag-Spont EPI  DEC  5 TAB           4 TAB              Birth Comments: System Generated. Please review and update pregnancy details.  3 TAB           2 SAB         ND  1 SAB               Past Surgical History:  Procedure Laterality Date  . CESAREAN SECTION N/A 05/20/2013   Procedure: CESAREAN SECTION;  Surgeon: CharlShelly Bombard  [facility]  Service: Obstetrics;  Laterality: N/A;  . INDUCED ABORTION  2009  . VAGINAL DELIVERY  2007    Family History: Family History  Problem Relation Age of Onset  . Depression Mother   . Anxiety disorder Mother   . Breast cancer Other   . Bipolar disorder Brother   . Diabetes Maternal Grandmother   . Cancer Maternal Grandmother   . Hypertension Maternal Grandmother   . Hypertension Paternal Grandmother   . Autism    .  Schizophrenia      Social History: Social History  Substance Use Topics  . Smoking status: Former Research scientist (life sciences)  . Smokeless tobacco: Never Used     Comment: was not a daily smoker, tried it  . Alcohol use No     Comment: Twice a month    Allergies:  Allergies  Allergen Reactions  . Zofran [Ondansetron Hcl] Nausea And Vomiting    Prescriptions Prior to Admission  Medication Sig Dispense Refill Last Dose  . acetaminophen (TYLENOL) 325 MG tablet Take 325 mg by mouth every 6 (six) hours as needed.   Taking  . blood glucose meter kit and supplies Dispense based on patient and insurance preference. Use up to four times daily as directed. Check fasting glucose, and 2 hours post meals. Accucheck Guide for Medicaid. Need strips and lancets for kit also. 1 each prn   . butalbital-acetaminophen-caffeine (FIORICET, ESGIC) 50-325-40 MG tablet Take 1-2 tablets by mouth every 6 (six) hours as needed. 45 tablet 4   . cyclobenzaprine (FLEXERIL) 10 MG tablet Take 1 tablet (10 mg total) by mouth every 6 (six)  hours as needed for muscle spasms. 30 tablet 0   . Doxylamine-Pyridoxine (DICLEGIS) 10-10 MG TBEC Take 1 tablet with breakfast and lunch.  Take 2 tablets at bedtime. 100 tablet 4   . FLUoxetine (PROZAC) 20 MG capsule Take 1 capsule (20 mg total) by mouth daily. 30 capsule 3   . omeprazole (PRILOSEC) 20 MG capsule Take 1 capsule (20 mg total) by mouth 2 (two) times daily before a meal. 60 capsule 5   . oseltamivir (TAMIFLU) 75 MG capsule Take 1 capsule (75 mg total) by mouth 2 (two) times daily. 10 capsule 0   . prochlorperazine (COMPAZINE) 10 MG tablet Take 1 tablet (10 mg total) by mouth every 6 (six) hours as needed for nausea or vomiting. 30 tablet 0   . promethazine (PHENERGAN) 25 MG tablet Take 0.5-1 tablets (12.5-25 mg total) by mouth every 6 (six) hours as needed for nausea. 30 tablet 0     ROS: Pertinent findings in history of present illness.  Physical Exam  Blood pressure 126/70, pulse (!) 116, temperature 98.6 F (37 C), temperature source Oral, resp. rate 18, height '5\' 7"'  (1.702 m), weight 251 lb 0.6 oz (113.9 kg), last menstrual period 07/22/2016. FHR 154 bpm CONSTITUTIONAL: Well-developed, well-nourished female in no acute distress.  HENT:  Normocephalic, atraumatic, External right and left ear normal. Oropharynx is clear and moist EYES: Conjunctivae and EOM are normal. Pupils are equal, round, and reactive to light. No scleral icterus.  NECK: Normal range of motion, supple, no masses SKIN: Skin is warm and dry. No rash noted. Not diaphoretic. No erythema. No pallor. [place]: Alert and oriented to person, place, and time. Normal reflexes, muscle tone coordination. No cranial nerve deficit noted. PSYCHIATRIC: Normal mood and affect. Normal behavior. Normal judgment and thought content. CARDIOVASCULAR: Normal heart rate noted, regular rhythm RESPIRATORY: Effort and breath sounds normal, no problems with respiration noted ABDOMEN: Soft, obese, nontender, nondistended, gravid  appropriate for gestational age MUSCULOSKELETAL: Normal range of motion. No edema and no tenderness. 2+ distal pulses.  PELVIC: NEFG, physiologic discharge, no blood Dilation: Closed Effacement (%): Thick Cervical Position: Posterior Exam by:: Dr. Harolyn Rutherford  Labs: Results for orders placed or performed during the hospital encounter of 12/13/16 (from the past 24 hour(s))  Urinalysis, Routine w reflex microscopic     Status: Abnormal   Collection Time: 12/13/16 12:32 PM  Result Value Ref  Range   Color, Urine YELLOW YELLOW   APPearance CLEAR CLEAR   Specific Gravity, Urine 1.028 1.005 - 1.030   pH 5.0 5.0 - 8.0   Glucose, UA 50 (A) NEGATIVE mg/dL   Hgb urine dipstick NEGATIVE NEGATIVE   Bilirubin Urine NEGATIVE NEGATIVE   Ketones, ur NEGATIVE NEGATIVE mg/dL   Protein, ur 30 (A) NEGATIVE mg/dL   Nitrite NEGATIVE NEGATIVE   Leukocytes, UA NEGATIVE NEGATIVE   RBC / HPF 0-5 0 - 5 RBC/hpf   WBC, UA 0-5 0 - 5 WBC/hpf   Bacteria, UA NONE SEEN NONE SEEN   Squamous Epithelial / LPF 0-5 (A) NONE SEEN   Mucous PRESENT     Imaging:  [place] Mfm Ob Detail +14 Wk  Result Date: 12/04/2016 ----------------------------------------------------------------------  OBSTETRICS REPORT                      (Signed Final 12/04/2016 07:55 am) ---------------------------------------------------------------------- Patient Info  ID #:       [id number]                         D.O.B.:   Oct 26, 1985 (30 yrs)  Name:       Melinda Jimenez                  Visit Date:  12/03/2016 01:38 pm ---------------------------------------------------------------------- Performed By  Performed By:     Corky Crafts             Ref. Address:     [address] [place]                                                             [place]  Attending:        Renella Cunas MD        Location:         [facility]  Referred By:      Morene Crocker CNM ---------------------------------------------------------------------- Orders   #  Description                                 Code   1  [place] MFM OB DETAIL +14 [place]  85631.49  ----------------------------------------------------------------------   #  Ordered By               Order #        Accession #    Episode #   1  Renella Cunas            [id number]      [id number]     [id number]  ---------------------------------------------------------------------- Indications   [redacted] weeks gestation of pregnancy                Z3A.19   Encounter for fetal anatomic survey            O70.96   Obesity complicating pregnancy, second         O99.212   trimester   Previous cesarean delivery, antepartum         O34.219   Gestational diabetes in pregnancy, diet        O24.410   controlled  ---------------------------------------------------------------------- OB History  Blood Type:            Height:  5'7"   Weight (lb):  241      BMI:   37.74  Gravidity:    9         Term:   3        Prem:   0        SAB:   2  TOP:          3       Ectopic:  0        Living: 2 ---------------------------------------------------------------------- Fetal Evaluation  Num Of Fetuses:     1  Fetal Heart         144  Rate(bpm):  Cardiac Activity:   Observed  Presentation:       Cephalic  Placenta:           Anterior, above cervical os  P. Cord Insertion:  Visualized, central  Amniotic Fluid  AFI FV:      Subjectively within normal limits                              Largest Pocket(cm)                              4.6 ---------------------------------------------------------------------- Biometry  BPD:      45.3  mm     G. Age:  19w 5d         74  %    CI:        71.35   %   70 - 86                                                          FL/HC:      17.4   %   16.1 - 18.3  HC:      170.8  mm     G. Age:  19w 5d         68  %    HC/AC:      1.22        1.09 - 1.39  AC:      140.3  mm     G. Age:  19w 3d         55  %    FL/BPD:     65.8   %  FL:       29.8  mm     G. Age:  19w 2d         45  %    FL/AC:      21.2   %   20 - 24  HUM:      30.3  mm     G. Age:  20w 0d         73  %  Est. FW:     288  gm    0 lb 10 oz      50  % ---------------------------------------------------------------------- Gestational Age  LMP:           19w 1d       Date:   07/22/16                 EDD:   04/28/17  U/S Today:     19w 4d                                        EDD:   04/25/17  Best:          19w 1d    Det. By:   LMP  (07/22/16)          EDD:   04/28/17 ---------------------------------------------------------------------- Anatomy  Cranium:               Appears normal         Aortic Arch:            Not well visualized  Cavum:                 Appears normal         Ductal Arch:            Appears normal  Ventricles:            Appears normal         Diaphragm:              Appears normal  Choroid Plexus:        Appears normal         Stomach:                Appears normal, left                                                                        sided  Cerebellum:            Not well visualized    Abdomen:                Appears normal  Posterior Fossa:       Not well visualized    Abdominal Wall:         Appears nml (cord  insert, abd wall)  Nuchal Fold:           Not well visualized    Cord Vessels:           Appears normal (3                                                                        vessel cord)  Face:                  Not well visualized    Kidneys:                Appear normal  Lips:                  Not well visualized    Bladder:                Appears normal  Thoracic:              Appears normal         Spine:                  Not well visualized  Heart:                 Appears normal         Upper Extremities:      Appears normal                         (4CH, axis, and situs  RVOT:                   Appears normal         Lower Extremities:      Appears normal  LVOT:                  Appears normal  Other:  Gender not well visualized. Nasal bone visualized. Heels and 5th digit          not well visualized. Technically difficult due to maternal habitus and          fetal position. ---------------------------------------------------------------------- Cervix Uterus Adnexa  Cervix  Length:           4.56  cm.  Normal appearance by transabdominal scan.  Uterus  No abnormality visualized.  Left Ovary  Not visualized.  Right Ovary  Not visualized. ---------------------------------------------------------------------- Impression  SIUP at 19+1 weeks  Normal detailed fetal anatomy; limited views of PF, face, AA  and spine  Markers of aneuploidy: none  Normal amniotic fluid volume  Measurements consistent with LMP dating ---------------------------------------------------------------------- Recommendations  Follow-up ultrasound in 4-6 weeks to complete anatomy  survey ----------------------------------------------------------------------                 Renella Cunas, MD Electronically Signed Final Report   12/04/2016 07:55 am ----------------------------------------------------------------------   MAU Course:   Assessment: 1. Abdominal pain during pregnancy in second trimester   2. Supervision of high risk pregnancy, antepartum     Plan: No signs of PTL, UA negative, patient reassured. Likely round ligament pain; pains due to enlarging gravid uterus.  Told to take Flexeril and Tylenol as needed Discharge home Labor precautions and fetal kick counts  reviewed Follow up with OB provider    Allergies as of 12/13/2016      Reactions   Zofran [ondansetron Hcl] Nausea And Vomiting      Medication List    STOP taking these medications   oseltamivir 75 MG capsule Commonly known as:  TAMIFLU     TAKE these medications   acetaminophen 325 MG tablet Commonly known as:  TYLENOL Take  325 mg by mouth every 6 (six) hours as needed.   blood glucose meter kit and supplies Dispense based on patient and insurance preference. Use up to four times daily as directed. Check fasting glucose, and 2 hours post meals. Accucheck Guide for Medicaid. Need strips and lancets for kit also.   butalbital-acetaminophen-caffeine 50-325-40 MG tablet Commonly known as:  FIORICET, ESGIC Take 1-2 tablets by mouth every 6 (six) hours as needed.   cyclobenzaprine 10 MG tablet Commonly known as:  FLEXERIL Take 1 tablet (10 mg total) by mouth every 6 (six) hours as needed for muscle spasms.   Doxylamine-Pyridoxine 10-10 MG Tbec Commonly known as:  DICLEGIS Take 1 tablet with breakfast and lunch.  Take 2 tablets at bedtime.   FLUoxetine 20 MG capsule Commonly known as:  PROZAC Take 1 capsule (20 mg total) by mouth daily.   omeprazole 20 MG capsule Commonly known as:  PRILOSEC Take 1 capsule (20 mg total) by mouth 2 (two) times daily before a meal.   prochlorperazine 10 MG tablet Commonly known as:  COMPAZINE Take 1 tablet (10 mg total) by mouth every 6 (six) hours as needed for nausea or vomiting.   promethazine 25 MG tablet Commonly known as:  PHENERGAN Take 0.5-1 tablets (12.5-25 mg total) by mouth every 6 (six) hours as needed for nausea.       Osborne Oman, MD 12/13/2016 2:19 PM

## 2016-12-13 NOTE — Discharge Instructions (Signed)
Abdominal Pain During Pregnancy  Abdominal pain is common in pregnancy. Most of the time, it does not cause harm. There are many causes of abdominal pain. Some causes are more serious than others and sometimes the cause is not known. Abdominal pain can be a sign that something is very wrong with the pregnancy or the pain may have nothing to do with the pregnancy. Always tell your health care provider if you have any abdominal pain.  Follow these instructions at home:  · Do not have sex or put anything in your vagina until your symptoms go away completely.  · Watch your abdominal pain for any changes.  · Get plenty of rest until your pain improves.  · Drink enough fluid to keep your urine clear or pale yellow.  · Take over-the-counter or prescription medicines only as told by your health care provider.  · Keep all follow-up visits as told by your health care provider. This is important.  Contact a health care provider if:  · You have a fever.  · Your pain gets worse or you have cramping.  · Your pain continues after resting.  Get help right away if:  · You are bleeding, leaking fluid, or passing tissue from the vagina.  · You have vomiting or diarrhea that does not go away.  · You have painful or bloody urination.  · You notice a decrease in your baby's movements.  · You feel very weak or faint.  · You have shortness of breath.  · You develop a severe headache with abdominal pain.  · You have abnormal vaginal discharge with abdominal pain.  This information is not intended to replace advice given to you by your health care provider. Make sure you discuss any questions you have with your health care provider.  Document Released: 09/17/2005 Document Revised: 06/28/2016 Document Reviewed: 04/16/2013  Elsevier Interactive Patient Education © 2017 [employer]

## 2016-12-20 ENCOUNTER — Encounter: Payer: Medicaid Other | Admitting: Certified Nurse Midwife

## 2017-01-01 ENCOUNTER — Encounter (HOSPITAL_COMMUNITY): Payer: Self-pay | Admitting: *Deleted

## 2017-01-01 ENCOUNTER — Inpatient Hospital Stay (HOSPITAL_COMMUNITY)
Admission: AD | Admit: 2017-01-01 | Discharge: 2017-01-01 | Disposition: A | Discharge status: Home / Self Care | Payer: Medicaid Other | Source: Ambulatory Visit | Attending: Family Medicine | Admitting: Family Medicine

## 2017-01-01 ENCOUNTER — Inpatient Hospital Stay (HOSPITAL_COMMUNITY): Payer: Medicaid Other

## 2017-01-01 DIAGNOSIS — O26899 Other specified pregnancy related conditions, unspecified trimester: Secondary | ICD-10-CM

## 2017-01-01 DIAGNOSIS — Z87891 Personal history of nicotine dependence: Secondary | ICD-10-CM | POA: Diagnosis not present

## 2017-01-01 DIAGNOSIS — R109 Unspecified abdominal pain: Secondary | ICD-10-CM | POA: Diagnosis not present

## 2017-01-01 DIAGNOSIS — Z79899 Other long term (current) drug therapy: Secondary | ICD-10-CM | POA: Insufficient documentation

## 2017-01-01 DIAGNOSIS — O26892 Other specified pregnancy related conditions, second trimester: Secondary | ICD-10-CM | POA: Insufficient documentation

## 2017-01-01 DIAGNOSIS — Z3A23 23 weeks gestation of pregnancy: Secondary | ICD-10-CM | POA: Diagnosis not present

## 2017-01-01 DIAGNOSIS — O9989 Other specified diseases and conditions complicating pregnancy, childbirth and the puerperium: Secondary | ICD-10-CM

## 2017-01-01 LAB — URINALYSIS, ROUTINE W REFLEX MICROSCOPIC
Bilirubin Urine: NEGATIVE
Glucose, UA: 150 mg/dL — AB
Hgb urine dipstick: NEGATIVE
Ketones, ur: 5 mg/dL — AB
Leukocytes, UA: NEGATIVE
Nitrite: NEGATIVE
PROTEIN: 30 mg/dL — AB
SPECIFIC GRAVITY, URINE: 1.027 (ref 1.005–1.030)
pH: 6 (ref 5.0–8.0)

## 2017-01-01 NOTE — MAU Provider Note (Signed)
History     CSN: [id number]  Arrival date and time: 01/01/17 2703   First Provider Initiated Contact with Patient 01/01/17 2018      Chief Complaint  Patient presents with  . Contractions   HPI Melinda Jimenez is a 31 y.o. [phone] at 109w3dwho presents with complaint of contractions. Reports intermittent abdominal pain & tightening. Symptoms began this morning. Can't tell how frequently. Rates pain 5/10. Has not treated. Denies LOF, vaginal bleeding, n/v/d, dysuria, or recent intercourse. Positive fetal movement.   OB History    Gravida Para Term Preterm AB Living   _0 SAB TAB Ectopic Multiple Live Births   _1 Past Medical History:  Diagnosis Date  . Anemia   . Anxiety    off meds with preg  . Carpal tunnel syndrome   . Chlamydia   . Genital HSV    Last OB: > 180yrgo; Valtrex 500 mg po prn outbreaks  . GERD (gastroesophageal reflux disease)   . Gestational diabetes    diet controlled  . Headache(784.0)   . IBS (irritable bowel syndrome)   . Infection    UTI  . Mood swings ([place]  . Panic attack   . Thyroid disease   . Trichomonas     Past Surgical History:  Procedure Laterality Date  . CESAREAN SECTION N/A 05/20/2013   Procedure: CESAREAN SECTION;  Surgeon: ChShelly BombardMD;  [facility];  Service: Obstetrics;  Laterality: N/A;  . INDUCED ABORTION  2009  . VAGINAL DELIVERY  2007    Family History  Problem Relation Age of Onset  . Depression Mother   . Anxiety disorder Mother   . Breast cancer Other   . Bipolar disorder Brother   . Diabetes Maternal Grandmother   . Cancer Maternal Grandmother   . Hypertension Maternal Grandmother   . Hypertension Paternal Grandmother   . Autism    . Schizophrenia      Social History  Substance Use Topics  . Smoking status: Former SmResearch scientist (life sciences). Smokeless tobacco: Never Used     Comment: was not a daily smoker, tried it  . Alcohol use No     Comment: Twice a month    Allergies:   Allergies  Allergen Reactions  . Zofran [Ondansetron Hcl] Nausea And Vomiting    Prescriptions Prior to Admission  Medication Sig Dispense Refill Last Dose  . acetaminophen (TYLENOL) 325 MG tablet Take 325 mg by mouth every 6 (six) hours as needed.   Taking  . blood glucose meter kit and supplies Dispense based on patient and insurance preference. Use up to four times daily as directed. Check fasting glucose, and 2 hours post meals. Accucheck Guide for Medicaid. Need strips and lancets for kit also. 1 each prn   . butalbital-acetaminophen-caffeine (FIORICET, ESGIC) 50-325-40 MG tablet Take 1-2 tablets by mouth every 6 (six) hours as needed. 45 tablet 4   . cyclobenzaprine (FLEXERIL) 10 MG tablet Take 1 tablet (10 mg total) by mouth every 6 (six) hours as needed for muscle spasms. 30 tablet 0   . Doxylamine-Pyridoxine (DICLEGIS) 10-10 MG TBEC Take 1 tablet with breakfast and lunch.  Take 2 tablets at bedtime. 100 tablet 4   . FLUoxetine (PROZAC) 20 MG capsule Take 1 capsule (20 mg total) by mouth daily. 30 capsule 3   . omeprazole (PRILOSEC) 20 MG capsule Take  1 capsule (20 mg total) by mouth 2 (two) times daily before a meal. 60 capsule 5   . prochlorperazine (COMPAZINE) 10 MG tablet Take 1 tablet (10 mg total) by mouth every 6 (six) hours as needed for nausea or vomiting. 30 tablet 0   . promethazine (PHENERGAN) 25 MG tablet Take 0.5-1 tablets (12.5-25 mg total) by mouth every 6 (six) hours as needed for nausea. 30 tablet 0     Review of Systems  Constitutional: Negative.   Gastrointestinal: Positive for abdominal pain. Negative for constipation, diarrhea, nausea and vomiting.  Genitourinary: Negative for dysuria, vaginal bleeding and vaginal discharge.  Musculoskeletal: Positive for back pain.   Physical Exam   Blood pressure (!) 104/59, pulse 100, resp. rate 16, height _0  (1.702 m), weight 245 lb (111.1 kg), last menstrual period 07/22/2016.  Physical Exam  Nursing note and  vitals reviewed. Constitutional: She is oriented to person, place, and time. She appears well-developed and well-nourished. No distress.  HENT:  Head: Normocephalic and atraumatic.  Eyes: Conjunctivae are normal. Right eye exhibits no discharge. Left eye exhibits no discharge. No scleral icterus.  Neck: Normal range of motion.  Cardiovascular: Normal rate, regular rhythm and normal heart sounds.   No murmur heard. Respiratory: Effort normal and breath sounds normal. No respiratory distress. She has no wheezes.  GI: Soft. There is no tenderness. There is no CVA tenderness.  Neurological: She is alert and oriented to person, place, and time.  Skin: Skin is warm and dry. She is not diaphoretic.  Psychiatric: She has a normal mood and affect. Her behavior is normal. Judgment and thought content normal.   Dilation: Closed Effacement (%): Thick Cervical Position: Posterior Exam by:: Robyne Askew NP  Fetal Tracing:  Baseline: 150 Variability: moderate Accelerations: 10x10 Decelerations: none   Toco: irr UI MAU Course  Procedures Results for orders placed or performed during the hospital encounter of 01/01/17 (from the past 24 hour(s))  Urinalysis, Routine w reflex microscopic     Status: Abnormal   Collection Time: 01/01/17  7:40 PM  Result Value Ref Range   Color, Urine YELLOW YELLOW   APPearance CLEAR CLEAR   Specific Gravity, Urine 1.027 1.005 - 1.030   pH 6.0 5.0 - 8.0   Glucose, UA 150 (A) NEGATIVE mg/dL   Hgb urine dipstick NEGATIVE NEGATIVE   Bilirubin Urine NEGATIVE NEGATIVE   Ketones, ur 5 (A) NEGATIVE mg/dL   Protein, ur 30 (A) NEGATIVE mg/dL   Nitrite NEGATIVE NEGATIVE   Leukocytes, UA NEGATIVE NEGATIVE   RBC / HPF 0-5 0 - 5 RBC/hpf   WBC, UA 0-5 0 - 5 WBC/hpf   Bacteria, UA RARE (A) NONE SEEN   Squamous Epithelial / LPF 0-5 (A) NONE SEEN   Mucous PRESENT     MDM Fetal tracing appropriate for gestation Irregular UI lasting 10-20 sec noted on TOCO, no  contractions palpated, cervix ext fingertip/firm TVUS -- CL 3.6 cm Assessment and Plan  A; 1. [redacted] weeks gestation of pregnancy   2. Abdominal pain affecting pregnancy    P: Discharge home Preterm labor precautions Increase water intake Recommended maternity support belt Keep f/u with OB  Jorje Guild 01/01/2017, 8:18 PM

## 2017-01-01 NOTE — Discharge Instructions (Signed)
. °Preterm Labor and Birth Information °The normal length of a pregnancy is 39-41 weeks. Preterm labor is when labor starts before 37 completed weeks of pregnancy. °What are the risk factors for preterm labor? °Preterm labor is more likely to occur in women who: °· Have certain infections during pregnancy such as a bladder infection, sexually transmitted infection, or infection inside the uterus (chorioamnionitis). °· Have a shorter-than-normal cervix. °· Have gone into preterm labor before. °· Have had surgery on their cervix. °· Are younger than age 17 or older than age 35. °· Are African American. °· Are pregnant with twins or multiple babies (multiple gestation). °· Take street drugs or smoke while pregnant. °· Do not gain enough weight while pregnant. °· Became pregnant shortly after having been pregnant. °What are the symptoms of preterm labor? °Symptoms of preterm labor include: °· Cramps similar to those that can happen during a menstrual period. The cramps may happen with diarrhea. °· Pain in the abdomen or lower back. °· Regular uterine contractions that may feel like tightening of the abdomen. °· A feeling of increased pressure in the pelvis. °· Increased watery or bloody mucus discharge from the vagina. °· Water breaking (ruptured amniotic sac). °Why is it important to recognize signs of preterm labor? °It is important to recognize signs of preterm labor because babies who are born prematurely may not be fully developed. This can put them at an increased risk for: °· Long-term (chronic) heart and lung problems. °· Difficulty immediately after birth with regulating body systems, including blood sugar, body temperature, heart rate, and breathing rate. °· Bleeding in the brain. °· Cerebral palsy. °· Learning difficulties. °· Death. °These risks are highest for babies who are born before 34 weeks of pregnancy. °How is preterm labor treated? °Treatment depends on the length of your pregnancy, your condition,  and the health of your baby. It may involve: °· Having a stitch (suture) placed in your cervix to prevent your cervix from opening too early (cerclage). °· Taking or being given medicines, such as: °¨ Hormone medicines. These may be given early in pregnancy to help support the pregnancy. °¨ Medicine to stop contractions. °¨ Medicines to help mature the baby’s lungs. These may be prescribed if the risk of delivery is high. °¨ Medicines to prevent your baby from developing cerebral palsy. °If the labor happens before 34 weeks of pregnancy, you may need to stay in the hospital. °What should I do if I think I am in preterm labor? °If you think that you are going into preterm labor, call your health care provider right away. °How can I prevent preterm labor in future pregnancies? °To increase your chance of having a full-term pregnancy: °· Do not use any tobacco products, such as cigarettes, chewing tobacco, and e-cigarettes. If you need help quitting, ask your health care provider. °· Do not use street drugs or medicines that have not been prescribed to you during your pregnancy. °· Talk with your health care provider before taking any herbal supplements, even if you have been taking them regularly. °· Make sure you gain a healthy amount of weight during your pregnancy. °· Watch for infection. If you think that you might have an infection, get it checked right away. °· Make sure to tell your health care provider if you have gone into preterm labor before. °This information is not intended to replace advice given to you by your health care provider. Make sure you discuss any questions you have with your   health care provider. °Document Released: 12/08/2003 Document Revised: 02/28/2016 Document Reviewed: 02/08/2016 °Elsevier Interactive Patient Education © 2017 Elsevier Inc. ° °

## 2017-01-01 NOTE — MAU Note (Signed)
Pt c/o mid tightness and sharp, low abd pain that she feels all the time. Reports good fetal movement. Denies any vag bleeding or leaking. She thinks she may have BV, and had started using metrogel last night that was prescribed by [facility].

## 2017-01-07 ENCOUNTER — Ambulatory Visit (INDEPENDENT_AMBULATORY_CARE_PROVIDER_SITE_OTHER): Payer: Medicaid Other | Admitting: Obstetrics

## 2017-01-07 ENCOUNTER — Encounter: Payer: Medicaid Other | Admitting: Certified Nurse Midwife

## 2017-01-07 VITALS — BP 117/73 | HR 96 | Wt 257.0 lb

## 2017-01-07 DIAGNOSIS — O0990 Supervision of high risk pregnancy, unspecified, unspecified trimester: Secondary | ICD-10-CM

## 2017-01-07 DIAGNOSIS — B009 Herpesviral infection, unspecified: Secondary | ICD-10-CM

## 2017-01-07 DIAGNOSIS — A6009 Herpesviral infection of other urogenital tract: Secondary | ICD-10-CM

## 2017-01-07 DIAGNOSIS — K5901 Slow transit constipation: Secondary | ICD-10-CM | POA: Diagnosis not present

## 2017-01-07 DIAGNOSIS — O98312 Other infections with a predominantly sexual mode of transmission complicating pregnancy, second trimester: Secondary | ICD-10-CM

## 2017-01-07 DIAGNOSIS — O24410 Gestational diabetes mellitus in pregnancy, diet controlled: Secondary | ICD-10-CM | POA: Diagnosis not present

## 2017-01-07 DIAGNOSIS — O98519 Other viral diseases complicating pregnancy, unspecified trimester: Secondary | ICD-10-CM

## 2017-01-07 MED ORDER — DOCUSATE SODIUM 100 MG PO CAPS: 100.0000 mg | ORAL_CAPSULE | Freq: Two times a day (BID) | ORAL | 5 refills | Status: DC

## 2017-01-07 MED ORDER — PRENATE MINI 29-0.6-0.4-350 MG PO CAPS: 1.0000 | ORAL_CAPSULE | Freq: Every day | ORAL | 3 refills | Status: DC

## 2017-01-07 MED ORDER — VALACYCLOVIR HCL 1 G PO TABS: 1000.0000 mg | ORAL_TABLET | Freq: Every day | ORAL | 11 refills | Status: DC

## 2017-01-07 NOTE — Progress Notes (Signed)
Patient complains of back pains.

## 2017-01-08 ENCOUNTER — Encounter: Payer: Self-pay | Admitting: Obstetrics

## 2017-01-08 NOTE — Progress Notes (Addendum)
Subjective:  Melinda Jimenez is a 31 y.o. [phone] at [redacted]w[redacted]d being seen today for ongoing prenatal care.  She is currently monitored for the following issues for this high-risk pregnancy and has Herpes genitalis in women; Goiter, unspecified; Vitamin D deficiency; CARPAL TUNNEL SYNDROME; GERD without esophagitis; Anxiety and depression; Supervision of high risk pregnancy, antepartum; H/O: cesarean section; GBS (group B Streptococcus carrier), +RV culture, currently pregnant; and Gestational diabetes mellitus (GDM) in second trimester on her problem list.  Patient reports backache.  Contractions: Not present. Vag. Bleeding: None.  Movement: Present. Denies leaking of fluid.   The following portions of the patient's history were reviewed and updated as appropriate: allergies, current medications, past family history, past medical history, past social history, past surgical history and problem list. Problem list updated.  Objective:   Vitals:   01/07/17 1553  BP: 117/73  Pulse: 96  Weight: 257 lb (116.6 kg)    Fetal Status: Fetal Heart Rate (bpm): 150   Movement: Present     General:  Alert, oriented and cooperative. Patient is in no acute distress.  Skin: Skin is warm and dry. No rash noted.   Cardiovascular: Normal heart rate noted  Respiratory: Normal respiratory effort, no problems with respiration noted  Abdomen: Soft, gravid, appropriate for gestational age. Pain/Pressure: Present     Pelvic:  Cervical exam deferred        Extremities: Normal range of motion.  Edema: None  Mental Status: Normal mood and affect. Normal behavior. Normal judgment and thought content.   Urinalysis:      Assessment and Plan:  Pregnancy: [id number] at [redacted]w[redacted]d  1. Supervision of high risk pregnancy, antepartum Rx: - Prenat w/o A-FeCbn-Meth-FA-DHA (PRENATE MINI) 29-0.6-0.4-350 MG CAPS; Take 1 capsule by mouth daily before breakfast.  Dispense: 90 capsule; Refill: 3  2. Diet controlled gestational diabetes  mellitus (GDM), antepartum Rx: - Prenat w/o A-FeCbn-Meth-FA-DHA (PRENATE MINI) 29-0.6-0.4-350 MG CAPS; Take 1 capsule by mouth daily before breakfast.  Dispense: 90 capsule; Refill: 3  3. Constipation by delayed colonic transit Rx: - Prenat w/o A-FeCbn-Meth-FA-DHA (PRENATE MINI) 29-0.6-0.4-350 MG CAPS; Take 1 capsule by mouth daily before breakfast.  Dispense: 90 capsule; Refill: 3 - docusate sodium (COLACE) 100 MG capsule; Take 1 capsule (100 mg total) by mouth 2 (two) times daily.  Dispense: 60 capsule; Refill: 5  4. HSV-2 infection complicating pregnancy, unspecified trimester Rx: - valACYclovir (VALTREX) 1000 MG tablet; Take 1 tablet (1,000 mg total) by mouth daily.  Dispense: 30 tablet; Refill: 11  5. Backache Rx: - Maternity Belt Rx Preterm labor symptoms and general obstetric precautions including but not limited to vaginal bleeding, contractions, leaking of fluid and fetal movement were reviewed in detail with the patient. Please refer to After Visit Summary for other counseling recommendations.  Return in 4 weeks (on 02/04/2017).   Brock Bad, MDPatient ID: Metta Clines, female   DOB: [birth year], 31 y.o.   MRN: [id number]

## 2017-01-22 ENCOUNTER — Other Ambulatory Visit: Payer: Self-pay | Admitting: Certified Nurse Midwife

## 2017-01-22 ENCOUNTER — Telehealth: Payer: Self-pay | Admitting: *Deleted

## 2017-01-22 DIAGNOSIS — B373 Candidiasis of vulva and vagina: Secondary | ICD-10-CM

## 2017-01-22 DIAGNOSIS — B3731 Acute candidiasis of vulva and vagina: Secondary | ICD-10-CM

## 2017-01-22 MED ORDER — FLUCONAZOLE 150 MG PO TABS: 150.0000 mg | ORAL_TABLET | Freq: Once | ORAL | 0 refills | Status: AC

## 2017-01-22 MED ORDER — TERCONAZOLE 0.8 % VA CREA: 1.0000 | TOPICAL_CREAM | Freq: Every day | VAGINAL | 0 refills | Status: DC

## 2017-01-22 NOTE — Telephone Encounter (Signed)
Patient states she has yeast and would like Diflucan for her treatment. Told patient I would check with her provider.

## 2017-01-22 NOTE — Telephone Encounter (Signed)
Please let her know that I have sent in diflucan and terazole cream to the pharmacy for her to use.  Thank you. R.Terryn Rosenkranz CNM

## 2017-01-22 NOTE — Telephone Encounter (Signed)
Patient notified

## 2017-01-27 ENCOUNTER — Encounter (HOSPITAL_COMMUNITY): Payer: Self-pay | Admitting: *Deleted

## 2017-01-27 ENCOUNTER — Inpatient Hospital Stay (HOSPITAL_COMMUNITY)
Admission: AD | Admit: 2017-01-27 | Discharge: 2017-01-27 | Disposition: A | Discharge status: Home / Self Care | Payer: Medicaid Other | Source: Ambulatory Visit | Attending: Obstetrics & Gynecology | Admitting: Obstetrics & Gynecology

## 2017-01-27 DIAGNOSIS — Z3A27 27 weeks gestation of pregnancy: Secondary | ICD-10-CM | POA: Insufficient documentation

## 2017-01-27 DIAGNOSIS — O99282 Endocrine, nutritional and metabolic diseases complicating pregnancy, second trimester: Secondary | ICD-10-CM | POA: Diagnosis not present

## 2017-01-27 DIAGNOSIS — O99612 Diseases of the digestive system complicating pregnancy, second trimester: Secondary | ICD-10-CM | POA: Diagnosis not present

## 2017-01-27 DIAGNOSIS — M545 Low back pain: Secondary | ICD-10-CM | POA: Diagnosis present

## 2017-01-27 DIAGNOSIS — O99352 Diseases of the nervous system complicating pregnancy, second trimester: Secondary | ICD-10-CM | POA: Insufficient documentation

## 2017-01-27 DIAGNOSIS — O98812 Other maternal infectious and parasitic diseases complicating pregnancy, second trimester: Secondary | ICD-10-CM | POA: Diagnosis not present

## 2017-01-27 DIAGNOSIS — O26892 Other specified pregnancy related conditions, second trimester: Secondary | ICD-10-CM | POA: Diagnosis not present

## 2017-01-27 DIAGNOSIS — Z87891 Personal history of nicotine dependence: Secondary | ICD-10-CM | POA: Diagnosis not present

## 2017-01-27 DIAGNOSIS — O99342 Other mental disorders complicating pregnancy, second trimester: Secondary | ICD-10-CM | POA: Diagnosis not present

## 2017-01-27 DIAGNOSIS — K589 Irritable bowel syndrome without diarrhea: Secondary | ICD-10-CM | POA: Insufficient documentation

## 2017-01-27 DIAGNOSIS — O99012 Anemia complicating pregnancy, second trimester: Secondary | ICD-10-CM | POA: Insufficient documentation

## 2017-01-27 DIAGNOSIS — O26899 Other specified pregnancy related conditions, unspecified trimester: Secondary | ICD-10-CM | POA: Diagnosis not present

## 2017-01-27 DIAGNOSIS — O24419 Gestational diabetes mellitus in pregnancy, unspecified control: Secondary | ICD-10-CM | POA: Insufficient documentation

## 2017-01-27 DIAGNOSIS — R102 Pelvic and perineal pain: Secondary | ICD-10-CM | POA: Diagnosis not present

## 2017-01-27 DIAGNOSIS — R109 Unspecified abdominal pain: Secondary | ICD-10-CM | POA: Diagnosis present

## 2017-01-27 LAB — URINALYSIS, ROUTINE W REFLEX MICROSCOPIC
Bilirubin Urine: NEGATIVE
GLUCOSE, UA: NEGATIVE mg/dL
Hgb urine dipstick: NEGATIVE
Ketones, ur: NEGATIVE mg/dL
LEUKOCYTES UA: NEGATIVE
NITRITE: NEGATIVE
Protein, ur: 30 mg/dL — AB
Specific Gravity, Urine: 1.025 (ref 1.005–1.030)
pH: 6 (ref 5.0–8.0)

## 2017-01-27 MED ORDER — COMFORT FIT MATERNITY SUPP LG MISC: 1.0000 [IU] | Freq: Every day | 0 refills | Status: DC

## 2017-01-27 NOTE — MAU Note (Signed)
Urine in lab 

## 2017-01-27 NOTE — MAU Note (Signed)
Pt here with c/o back pain, abdominal pain, legs hurt when walking. Denies any bleeding or leaking of fluid.

## 2017-01-27 NOTE — MAU Provider Note (Signed)
History     CSN: [id number]  Arrival date and time: 01/27/17 2123  First Provider Initiated Contact with Patient 01/27/17 2224      Chief Complaint  Patient presents with  . Contractions  . Back Pain   HPI Melinda Jimenez is a 31 y.o. [phone] at [redacted]w[redacted]d who presents with abdominal cramping & low back pain. Symptoms began today around 6 pm. Pain comes & goes. Pain occurs with position changes & walking. Rates pain 9/10. Pain does not occur in seated position. Has not treated pain. Was previously prescribed maternity support belt that she never picked up. Denies n/v/d, constipation, dysuria, vaginal discharge, LOF, or recent intercourse. Positive fetal movement.   OB History    Gravida Para Term Preterm AB Living   SAB TAB Ectopic Multiple Live Births   Past Medical History:  Diagnosis Date  . Anemia   . Anxiety    off meds with preg  . Carpal tunnel syndrome   . Chlamydia   . Genital HSV    Last OB: > 23yr ago; Valtrex 500 mg po prn outbreaks  . GERD (gastroesophageal reflux disease)   . Gestational diabetes    diet controlled  . Headache(784.0)   . IBS (irritable bowel syndrome)   . Infection    UTI  . Mood swings (HCC)   . Panic attack   . Thyroid disease   . Trichomonas     Past Surgical History:  Procedure Laterality Date  . CESAREAN SECTION N/A 05/20/2013   Procedure: CESAREAN SECTION;  Surgeon: Brock Bad, MD;  Location: [facility];  Service: Obstetrics;  Laterality: N/A;  . INDUCED ABORTION  2009  . VAGINAL DELIVERY  2007    Family History  Problem Relation Age of Onset  . Depression Mother   . Anxiety disorder Mother   . Breast cancer Other   . Bipolar disorder Brother   . Diabetes Maternal Grandmother   . Cancer Maternal Grandmother   . Hypertension Maternal Grandmother   . Hypertension Paternal Grandmother   . Autism    . Schizophrenia      Social History  Substance Use Topics  . Smoking status: Former Games developer  .  Smokeless tobacco: Never Used     Comment: was not a daily smoker, tried it  . Alcohol use No     Comment: Twice a month    Allergies:  Allergies  Allergen Reactions  . Zofran [Ondansetron Hcl] Nausea And Vomiting    Prescriptions Prior to Admission  Medication Sig Dispense Refill Last Dose  . acetaminophen (TYLENOL) 325 MG tablet Take 325 mg by mouth every 6 (six) hours as needed.   Taking  . butalbital-acetaminophen-caffeine (FIORICET, ESGIC) 50-325-40 MG tablet Take 1-2 tablets by mouth every 6 (six) hours as needed. (Patient not taking: Reported on 01/01/2017) 45 tablet 4 Not Taking at Unknown time  . cyclobenzaprine (FLEXERIL) 10 MG tablet Take 1 tablet (10 mg total) by mouth every 6 (six) hours as needed for muscle spasms. (Patient not taking: Reported on 01/07/2017) 30 tablet 0 Not Taking  . docusate sodium (COLACE) 100 MG capsule Take 1 capsule (100 mg total) by mouth 2 (two) times daily. 60 capsule 5   . Doxylamine-Pyridoxine (DICLEGIS) 10-10 MG TBEC Take 1 tablet with breakfast and lunch.  Take 2 tablets at bedtime. (Patient not taking: Reported on 01/01/2017) 100 tablet  4 Not Taking at Unknown time  . FLUoxetine (PROZAC) 20 MG capsule Take 1 capsule (20 mg total) by mouth daily. (Patient not taking: Reported on 01/01/2017) 30 capsule 3 Not Taking at Unknown time  . omeprazole (PRILOSEC) 20 MG capsule Take 1 capsule (20 mg total) by mouth 2 (two) times daily before a meal. (Patient not taking: Reported on 01/01/2017) 60 capsule 5 Not Taking at Unknown time  . Prenat w/o A-FeCbn-Meth-FA-DHA (PRENATE MINI) 29-0.6-0.4-350 MG CAPS Take 1 capsule by mouth daily before breakfast. 90 capsule 3   . prochlorperazine (COMPAZINE) 10 MG tablet Take 1 tablet (10 mg total) by mouth every 6 (six) hours as needed for nausea or vomiting. (Patient not taking: Reported on 01/01/2017) 30 tablet 0 Not Taking at Unknown time  . promethazine (PHENERGAN) 25 MG tablet Take 0.5-1 tablets (12.5-25 mg total) by mouth every  6 (six) hours as needed for nausea. (Patient not taking: Reported on 01/01/2017) 30 tablet 0 Not Taking at Unknown time  . terconazole (TERAZOL 3) 0.8 % vaginal cream Place 1 applicator vaginally at bedtime. 20 g 0   . valACYclovir (VALTREX) 1000 MG tablet Take 1 tablet (1,000 mg total) by mouth daily. 30 tablet 11     Review of Systems  Constitutional: Negative.   Gastrointestinal: Positive for abdominal pain. Negative for constipation, diarrhea, nausea and vomiting.  Genitourinary: Negative.  Negative for dysuria, vaginal bleeding and vaginal discharge.  Musculoskeletal: Positive for back pain.   Physical Exam   Blood pressure 121/73, pulse 97, temperature 97.9 F (36.6 C), temperature source Oral, resp. rate 18, height  (1.727 m), weight 258 lb (117 kg), last menstrual period 07/22/2016, SpO2 98 %.  Physical Exam  Nursing note and vitals reviewed. Constitutional: She is oriented to person, place, and time. She appears well-developed and well-nourished. No distress.  HENT:  Head: Normocephalic and atraumatic.  Eyes: Conjunctivae are normal. Right eye exhibits no discharge. Left eye exhibits no discharge. No scleral icterus.  Neck: Normal range of motion.  Respiratory: Effort normal. No respiratory distress.  GI: Soft. There is no tenderness.  No ctx palpated  Neurological: She is alert and oriented to person, place, and time.  Skin: Skin is warm and dry. She is not diaphoretic.  Psychiatric: She has a normal mood and affect. Her behavior is normal. Judgment and thought content normal.   Dilation: Closed Effacement (%): Thick Cervical Position: Posterior Exam by:: Judeth Horn NP  Fetal Tracing:  Baseline: 140 Variability: moderate Accelerations: 15x15 Decelerations: none  Toco:  irr UI MAU Course  Procedures Results for orders placed or performed during the hospital encounter of 01/27/17 (from the past 24 hour(s))  Urinalysis, Routine w reflex microscopic      Status: Abnormal   Collection Time: 01/27/17  9:40 PM  Result Value Ref Range   Color, Urine YELLOW YELLOW   APPearance CLEAR CLEAR   Specific Gravity, Urine 1.025 1.005 - 1.030   pH 6.0 5.0 - 8.0   Glucose, UA NEGATIVE NEGATIVE mg/dL   Hgb urine dipstick NEGATIVE NEGATIVE   Bilirubin Urine NEGATIVE NEGATIVE   Ketones, ur NEGATIVE NEGATIVE mg/dL   Protein, ur 30 (A) NEGATIVE mg/dL   Nitrite NEGATIVE NEGATIVE   Leukocytes, UA NEGATIVE NEGATIVE   RBC / HPF 0-5 0 - 5 RBC/hpf   WBC, UA 0-5 0 - 5 WBC/hpf   Bacteria, UA RARE (A) NONE SEEN   Squamous Epithelial / LPF 0-5 (A) NONE SEEN   Mucous PRESENT  MDM Reactive fetal tracing No ctx on TOCO -- no ctx palpated Cervix closed VSS, NAD  Assessment and Plan  A:  1. Pain of round ligament during pregnancy    P: Discharge home Rx maternity support belt Discussed reasons to return to MAU -- preterm labor precautions Keep follow up appointment with OB/PCP   Judeth Horn 01/27/2017, 10:24 PM

## 2017-01-27 NOTE — Discharge Instructions (Signed)
Round Ligament Pain The round ligament is a cord of muscle and tissue that helps to support the uterus. It can become a source of pain during pregnancy if it becomes stretched or twisted as the baby grows. The pain usually begins in the second trimester of pregnancy, and it can come and go until the baby is delivered. It is not a serious problem, and it does not cause harm to the baby. Round ligament pain is usually a short, sharp, and pinching pain, but it can also be a dull, lingering, and aching pain. The pain is felt in the lower side of the abdomen or in the groin. It usually starts deep in the groin and moves up to the outside of the hip area. Pain can occur with:  A sudden change in position.  Rolling over in bed.  Coughing or sneezing.  Physical activity. Follow these instructions at home: Watch your condition for any changes. Take these steps to help with your pain:  When the pain starts, relax. Then try:  Sitting down.  Flexing your knees up to your abdomen.  Lying on your side with one pillow under your abdomen and another pillow between your legs.  Sitting in a warm bath for 15-20 minutes or until the pain goes away.  Take over-the-counter and prescription medicines only as told by your health care provider.  Move slowly when you sit and stand.  Avoid long walks if they cause pain.  Stop or lessen your physical activities if they cause pain. Contact a health care provider if:  Your pain does not go away with treatment.  You feel pain in your back that you did not have before.  Your medicine is not helping. Get help right away if:  You develop a fever or chills.  You develop uterine contractions.  You develop vaginal bleeding.  You develop nausea or vomiting.  You develop diarrhea.  You have pain when you urinate. This information is not intended to replace advice given to you by your health care provider. Make sure you discuss any questions you have with  your health care provider. Document Released: 06/26/2008 Document Revised: 02/23/2016 Document Reviewed: 11/24/2014 Elsevier Interactive Patient Education  2017 [employer].     Preterm Labor and Birth Information The normal length of a pregnancy is 39-41 weeks. Preterm labor is when labor starts before 37 completed weeks of pregnancy. What are the risk factors for preterm labor? Preterm labor is more likely to occur in women who:  Have certain infections during pregnancy such as a bladder infection, sexually transmitted infection, or infection inside the uterus (chorioamnionitis).  Have a shorter-than-normal cervix.  Have gone into preterm labor before.  Have had surgery on their cervix.  Are younger than age 78 or older than age 56.  Are African American.  Are pregnant with twins or multiple babies (multiple gestation).  Take street drugs or smoke while pregnant.  Do not gain enough weight while pregnant.  Became pregnant shortly after having been pregnant. What are the symptoms of preterm labor? Symptoms of preterm labor include:  Cramps similar to those that can happen during a menstrual period. The cramps may happen with diarrhea.  Pain in the abdomen or lower back.  Regular uterine contractions that may feel like tightening of the abdomen.  A feeling of increased pressure in the pelvis.  Increased watery or bloody mucus discharge from the vagina.  Water breaking (ruptured amniotic sac). Why is it important to recognize signs of  preterm labor? It is important to recognize signs of preterm labor because babies who are born prematurely may not be fully developed. This can put them at an increased risk for:  Long-term (chronic) heart and lung problems.  Difficulty immediately after birth with regulating body systems, including blood sugar, body temperature, heart rate, and breathing rate.  Bleeding in the brain.  Cerebral palsy.  Learning  difficulties.  Death. These risks are highest for babies who are born before 34 weeks of pregnancy. How is preterm labor treated? Treatment depends on the length of your pregnancy, your condition, and the health of your baby. It may involve:  Having a stitch (suture) placed in your cervix to prevent your cervix from opening too early (cerclage).  Taking or being given medicines, such as:  Hormone medicines. These may be given early in pregnancy to help support the pregnancy.  Medicine to stop contractions.  Medicines to help mature the babys lungs. These may be prescribed if the risk of delivery is high.  Medicines to prevent your baby from developing cerebral palsy. If the labor happens before 34 weeks of pregnancy, you may need to stay in the hospital. What should I do if I think I am in preterm labor? If you think that you are going into preterm labor, call your health care provider right away. How can I prevent preterm labor in future pregnancies? To increase your chance of having a full-term pregnancy:  Do not use any tobacco products, such as cigarettes, chewing tobacco, and e-cigarettes. If you need help quitting, ask your health care provider.  Do not use street drugs or medicines that have not been prescribed to you during your pregnancy.  Talk with your health care provider before taking any herbal supplements, even if you have been taking them regularly.  Make sure you gain a healthy amount of weight during your pregnancy.  Watch for infection. If you think that you might have an infection, get it checked right away.  Make sure to tell your health care provider if you have gone into preterm labor before. This information is not intended to replace advice given to you by your health care provider. Make sure you discuss any questions you have with your health care provider. Document Released: 12/08/2003 Document Revised: 02/28/2016 Document Reviewed: 02/08/2016 Elsevier  Interactive Patient Education  2017 [employer].

## 2017-02-04 ENCOUNTER — Ambulatory Visit (INDEPENDENT_AMBULATORY_CARE_PROVIDER_SITE_OTHER): Payer: Medicaid Other | Admitting: Certified Nurse Midwife

## 2017-02-04 ENCOUNTER — Other Ambulatory Visit (HOSPITAL_COMMUNITY)
Admission: RE | Admit: 2017-02-04 | Discharge: 2017-02-04 | Disposition: A | Discharge status: Home / Self Care | Payer: Medicaid Other | Source: Ambulatory Visit | Attending: Certified Nurse Midwife | Admitting: Certified Nurse Midwife

## 2017-02-04 VITALS — BP 108/73 | HR 103 | Wt 254.7 lb

## 2017-02-04 DIAGNOSIS — Z91199 Patient's noncompliance with other medical treatment and regimen due to unspecified reason: Secondary | ICD-10-CM

## 2017-02-04 DIAGNOSIS — O0993 Supervision of high risk pregnancy, unspecified, third trimester: Secondary | ICD-10-CM | POA: Diagnosis not present

## 2017-02-04 DIAGNOSIS — O99820 Streptococcus B carrier state complicating pregnancy: Secondary | ICD-10-CM

## 2017-02-04 DIAGNOSIS — O26843 Uterine size-date discrepancy, third trimester: Secondary | ICD-10-CM

## 2017-02-04 DIAGNOSIS — E559 Vitamin D deficiency, unspecified: Secondary | ICD-10-CM

## 2017-02-04 DIAGNOSIS — O24419 Gestational diabetes mellitus in pregnancy, unspecified control: Secondary | ICD-10-CM | POA: Diagnosis not present

## 2017-02-04 DIAGNOSIS — Z98891 History of uterine scar from previous surgery: Secondary | ICD-10-CM

## 2017-02-04 DIAGNOSIS — N898 Other specified noninflammatory disorders of vagina: Secondary | ICD-10-CM | POA: Insufficient documentation

## 2017-02-04 DIAGNOSIS — Z9119 Patient's noncompliance with other medical treatment and regimen: Secondary | ICD-10-CM

## 2017-02-04 DIAGNOSIS — A6009 Herpesviral infection of other urogenital tract: Secondary | ICD-10-CM | POA: Diagnosis not present

## 2017-02-04 DIAGNOSIS — O0990 Supervision of high risk pregnancy, unspecified, unspecified trimester: Secondary | ICD-10-CM

## 2017-02-04 NOTE — Progress Notes (Signed)
Patient complains of contractions that come and go about every hour, reports good fetal movement.

## 2017-02-04 NOTE — Progress Notes (Signed)
   PRENATAL VISIT NOTE  Subjective:  Melinda Jimenez is a 31 y.o. [phone] at 1232w1d being seen today for ongoing prenatal care.  She is currently monitored for the following issues for this high-risk pregnancy and has Herpes genitalis in women; Goiter, unspecified; Vitamin D deficiency; CARPAL TUNNEL SYNDROME; GERD without esophagitis; Anxiety and depression; Supervision of high risk pregnancy, antepartum; H/O: cesarean section; GBS (group B Streptococcus carrier), +RV culture, currently pregnant; and Gestational diabetes mellitus (GDM) in third trimester on her problem list.  Patient reports backache, no bleeding, no leaking and occasional contractions.  Contractions: Irregular. Vag. Bleeding: None.  Movement: Present. Denies leaking of fluid.   The following portions of the patient's history were reviewed and updated as appropriate: allergies, current medications, past family history, past medical history, past social history, past surgical history and problem list. Problem list updated.  Objective:   Vitals:   02/04/17 1612  BP: 108/73  Pulse: (!) 103  Weight: 254 lb 11.2 oz (115.5 kg)    Fetal Status: Fetal Heart Rate (bpm): 145 Fundal Height: 40 cm Movement: Present     General:  Alert, oriented and cooperative. Patient is in no acute distress.  Skin: Skin is warm and dry. No rash noted.   Cardiovascular: Normal heart rate noted  Respiratory: Normal respiratory effort, no problems with respiration noted  Abdomen: Soft, gravid, appropriate for gestational age. Pain/Pressure: Present     Pelvic:  Cervical exam performed Dilation: Closed Effacement (%): 0    Extremities: Normal range of motion.  Edema: None  Mental Status: Normal mood and affect. Normal behavior. Normal judgment and thought content.   Assessment and Plan:  Pregnancy: [id number] at 1132w1d  1. Supervision of high risk pregnancy, antepartum      - Hemoglobin A1c - CBC with Differential/Platelet - HIV antibody - RPR -  US MFM OB FOLLOW UP; Future - Fetal fibronectin  2. Herpes genitalis in women     Valtrex @36  weeks  3. Gestational diabetes mellitus (GDM) in third trimester, gestational diabetes method of control unspecified     Medical non-compliance: has not been checking her blood sugars - Hemoglobin A1c - US MFM OB FOLLOW UP; Future  4. H/O: cesarean section     Desires Repeat C-section  5. Vitamin D deficiency     Taking weekly vitamin D  6. GBS (group B Streptococcus carrier), +RV culture, currently pregnant     PCN for labor  7. Uterine size date discrepancy pregnancy, third trimester      Size > Dates - US MFM OB FOLLOW UP; Future  8. Medical non-compliance     Not checking GDM blood sugars - US MFM OB FOLLOW UP; Future  9. Vaginal irritation       - Cervicovaginal ancillary only  Preterm labor symptoms and general obstetric precautions including but not limited to vaginal bleeding, contractions, leaking of fluid and fetal movement were reviewed in detail with the patient. Please refer to After Visit Summary for other counseling recommendations.  [facility] (around 02/18/2017[facility] Needs to see FP MD here.   Roe Coombsenney, Gioia Ranes A, CNM

## 2017-02-05 ENCOUNTER — Telehealth: Payer: Self-pay | Admitting: *Deleted

## 2017-02-05 ENCOUNTER — Other Ambulatory Visit: Payer: Self-pay | Admitting: Certified Nurse Midwife

## 2017-02-05 DIAGNOSIS — R102 Pelvic and perineal pain: Principal | ICD-10-CM

## 2017-02-05 DIAGNOSIS — M549 Dorsalgia, unspecified: Secondary | ICD-10-CM

## 2017-02-05 DIAGNOSIS — O99891 Other specified diseases and conditions complicating pregnancy: Secondary | ICD-10-CM

## 2017-02-05 DIAGNOSIS — O26899 Other specified pregnancy related conditions, unspecified trimester: Secondary | ICD-10-CM

## 2017-02-05 DIAGNOSIS — O9989 Other specified diseases and conditions complicating pregnancy, childbirth and the puerperium: Secondary | ICD-10-CM

## 2017-02-05 LAB — CBC WITH DIFFERENTIAL/PLATELET
Basophils Absolute: 0 10*3/uL (ref 0.0–0.2)
Basos: 0 %
EOS (ABSOLUTE): 0.2 10*3/uL (ref 0.0–0.4)
Eos: 2 %
HEMATOCRIT: 32.3 % — AB (ref 34.0–46.6)
HEMOGLOBIN: 10.6 g/dL — AB (ref 11.1–15.9)
Immature Grans (Abs): 0 10*3/uL (ref 0.0–0.1)
Immature Granulocytes: 0 %
LYMPHS ABS: 3 10*3/uL (ref 0.7–3.1)
Lymphs: 28 %
MCH: 23.3 pg — ABNORMAL LOW (ref 26.6–33.0)
MCHC: 32.8 g/dL (ref 31.5–35.7)
MCV: 71 fL — AB (ref 79–97)
Monocytes Absolute: 0.8 10*3/uL (ref 0.1–0.9)
Monocytes: 7 %
NEUTROS ABS: 6.9 10*3/uL (ref 1.4–7.0)
Neutrophils: 63 %
Platelets: 311 10*3/uL (ref 150–379)
RBC: 4.54 x10E6/uL (ref 3.77–5.28)
RDW: 16.2 % — ABNORMAL HIGH (ref 12.3–15.4)
WBC: 10.9 10*3/uL — AB (ref 3.4–10.8)

## 2017-02-05 LAB — HEMOGLOBIN A1C
Est. average glucose Bld gHb Est-mCnc: 148 mg/dL
Hgb A1c MFr Bld: 6.8 % — ABNORMAL HIGH (ref 4.8–5.6)

## 2017-02-05 LAB — RPR: RPR: NONREACTIVE

## 2017-02-05 LAB — FETAL FIBRONECTIN: Fetal Fibronectin: NEGATIVE

## 2017-02-05 LAB — HIV ANTIBODY (ROUTINE TESTING W REFLEX): HIV Screen 4th Generation wRfx: NONREACTIVE

## 2017-02-05 MED ORDER — CYCLOBENZAPRINE HCL 10 MG PO TABS: 10.0000 mg | ORAL_TABLET | Freq: Three times a day (TID) | ORAL | 1 refills | Status: DC | PRN

## 2017-02-05 NOTE — Telephone Encounter (Signed)
Pt called to office for lab results and u/s appt. Labs were reviewed and [facility]. Spoke with pt regarding back pain, made aware that Boykin ReaperRachelle has sent her Rx to her pharmacy. Pt advised of at home measures, warm bath, heat compress to back. Pt advised that if back pain severe enough that [facility]. Pt states understanding.

## 2017-02-06 LAB — CERVICOVAGINAL ANCILLARY ONLY
Bacterial vaginitis: NEGATIVE
Candida vaginitis: NEGATIVE
Chlamydia: NEGATIVE
Neisseria Gonorrhea: NEGATIVE
Trichomonas: NEGATIVE

## 2017-02-07 ENCOUNTER — Encounter (HOSPITAL_COMMUNITY): Payer: Self-pay

## 2017-02-07 ENCOUNTER — Encounter: Payer: Self-pay | Admitting: *Deleted

## 2017-02-08 ENCOUNTER — Encounter (HOSPITAL_COMMUNITY): Payer: Self-pay

## 2017-02-12 ENCOUNTER — Other Ambulatory Visit (HOSPITAL_COMMUNITY): Payer: Self-pay | Admitting: *Deleted

## 2017-02-12 ENCOUNTER — Other Ambulatory Visit: Payer: Self-pay | Admitting: *Deleted

## 2017-02-12 ENCOUNTER — Ambulatory Visit (HOSPITAL_COMMUNITY)
Admission: RE | Admit: 2017-02-12 | Discharge: 2017-02-12 | Disposition: A | Discharge status: Home / Self Care | Payer: Medicaid Other | Source: Ambulatory Visit | Attending: Certified Nurse Midwife | Admitting: Certified Nurse Midwife

## 2017-02-12 ENCOUNTER — Encounter (HOSPITAL_COMMUNITY): Payer: Self-pay

## 2017-02-12 DIAGNOSIS — Z3A29 29 weeks gestation of pregnancy: Secondary | ICD-10-CM | POA: Diagnosis not present

## 2017-02-12 DIAGNOSIS — O24419 Gestational diabetes mellitus in pregnancy, unspecified control: Secondary | ICD-10-CM

## 2017-02-12 DIAGNOSIS — Z9119 Patient's noncompliance with other medical treatment and regimen: Secondary | ICD-10-CM | POA: Insufficient documentation

## 2017-02-12 DIAGNOSIS — O0993 Supervision of high risk pregnancy, unspecified, third trimester: Secondary | ICD-10-CM | POA: Insufficient documentation

## 2017-02-12 DIAGNOSIS — Z91199 Patient's noncompliance with other medical treatment and regimen due to unspecified reason: Secondary | ICD-10-CM

## 2017-02-12 DIAGNOSIS — O0990 Supervision of high risk pregnancy, unspecified, unspecified trimester: Secondary | ICD-10-CM

## 2017-02-12 DIAGNOSIS — O26843 Uterine size-date discrepancy, third trimester: Secondary | ICD-10-CM | POA: Insufficient documentation

## 2017-02-12 MED ORDER — ACCU-CHEK MULTICLIX LANCETS MISC: 5 refills | Status: DC

## 2017-02-12 MED ORDER — GLUCOSE BLOOD VI STRP: ORAL_STRIP | 5 refills | Status: DC

## 2017-02-12 MED ORDER — ACCU-CHEK GUIDE W/DEVICE KIT: 1.0000 | PACK | Freq: Once | 0 refills | Status: AC

## 2017-02-12 NOTE — ED Notes (Signed)
Pt states that she has not been checking her blood sugars, she ran out of lancets and strips.  Pt unsure of which meter she has, message left at her office to request strips and lancets be called into pharmacy listed in EPIC.

## 2017-02-12 NOTE — Progress Notes (Signed)
Call received from [facility] during pt appt there today.  Pt tells them that she is not checking her blood sugar due to not having supplies. Supplies ordered to pharmacy today.  Pt to be made aware.

## 2017-02-14 ENCOUNTER — Other Ambulatory Visit: Payer: Self-pay | Admitting: Certified Nurse Midwife

## 2017-02-14 DIAGNOSIS — O0990 Supervision of high risk pregnancy, unspecified, unspecified trimester: Secondary | ICD-10-CM

## 2017-02-18 ENCOUNTER — Encounter: Payer: Medicaid Other | Admitting: Obstetrics and Gynecology

## 2017-02-24 ENCOUNTER — Encounter (HOSPITAL_COMMUNITY): Payer: Self-pay | Admitting: *Deleted

## 2017-02-24 ENCOUNTER — Inpatient Hospital Stay (HOSPITAL_COMMUNITY)
Admission: AD | Admit: 2017-02-24 | Discharge: 2017-02-24 | Disposition: A | Discharge status: Home / Self Care | Payer: Medicaid Other | Source: Ambulatory Visit | Attending: Obstetrics and Gynecology | Admitting: Obstetrics and Gynecology

## 2017-02-24 DIAGNOSIS — O219 Vomiting of pregnancy, unspecified: Secondary | ICD-10-CM

## 2017-02-24 DIAGNOSIS — Z3A31 31 weeks gestation of pregnancy: Secondary | ICD-10-CM | POA: Insufficient documentation

## 2017-02-24 DIAGNOSIS — O26893 Other specified pregnancy related conditions, third trimester: Secondary | ICD-10-CM | POA: Diagnosis present

## 2017-02-24 DIAGNOSIS — R112 Nausea with vomiting, unspecified: Secondary | ICD-10-CM | POA: Insufficient documentation

## 2017-02-24 DIAGNOSIS — O4703 False labor before 37 completed weeks of gestation, third trimester: Secondary | ICD-10-CM | POA: Diagnosis not present

## 2017-02-24 DIAGNOSIS — M549 Dorsalgia, unspecified: Secondary | ICD-10-CM

## 2017-02-24 DIAGNOSIS — O24419 Gestational diabetes mellitus in pregnancy, unspecified control: Secondary | ICD-10-CM

## 2017-02-24 DIAGNOSIS — O479 False labor, unspecified: Secondary | ICD-10-CM

## 2017-02-24 DIAGNOSIS — O9989 Other specified diseases and conditions complicating pregnancy, childbirth and the puerperium: Secondary | ICD-10-CM

## 2017-02-24 DIAGNOSIS — O99891 Other specified diseases and conditions complicating pregnancy: Secondary | ICD-10-CM

## 2017-02-24 LAB — COMPREHENSIVE METABOLIC PANEL
ALT: 10 U/L — AB (ref 14–54)
AST: 20 U/L (ref 15–41)
Albumin: 3.1 g/dL — ABNORMAL LOW (ref 3.5–5.0)
Alkaline Phosphatase: 63 U/L (ref 38–126)
Anion gap: 10 (ref 5–15)
BUN: 10 mg/dL (ref 6–20)
CHLORIDE: 103 mmol/L (ref 101–111)
CO2: 20 mmol/L — AB (ref 22–32)
CREATININE: 0.53 mg/dL (ref 0.44–1.00)
Calcium: 9.2 mg/dL (ref 8.9–10.3)
GFR calc Af Amer: >60 mL/min (ref >60)
GFR calc non Af Amer: >60 mL/min (ref >60)
Glucose, Bld: 148 mg/dL — ABNORMAL HIGH (ref 65–99)
POTASSIUM: 4.2 mmol/L (ref 3.5–5.1)
SODIUM: 133 mmol/L — AB (ref 135–145)
Total Bilirubin: 0.6 mg/dL (ref 0.3–1.2)
Total Protein: 7.3 g/dL (ref 6.5–8.1)

## 2017-02-24 LAB — URINALYSIS, ROUTINE W REFLEX MICROSCOPIC
BACTERIA UA: NONE SEEN
Bilirubin Urine: NEGATIVE
Glucose, UA: 150 mg/dL — AB
Hgb urine dipstick: NEGATIVE
Ketones, ur: 5 mg/dL — AB
Leukocytes, UA: NEGATIVE
Nitrite: NEGATIVE
PROTEIN: 30 mg/dL — AB
Specific Gravity, Urine: 1.023 (ref 1.005–1.030)
pH: 6 (ref 5.0–8.0)

## 2017-02-24 MED ORDER — METFORMIN HCL 1000 MG PO TABS: 1000.0000 mg | ORAL_TABLET | Freq: Two times a day (BID) | ORAL | 0 refills | Status: DC

## 2017-02-24 MED ORDER — LACTATED RINGERS IV BOLUS (SEPSIS)
1000.0000 mL | Freq: Once | INTRAVENOUS | Status: AC
  Administered 2017-02-24: 1000 mL via INTRAVENOUS

## 2017-02-24 MED ORDER — METOCLOPRAMIDE HCL 10 MG PO TABS: 10.0000 mg | ORAL_TABLET | Freq: Three times a day (TID) | ORAL | 0 refills | Status: DC

## 2017-02-24 MED ORDER — METOCLOPRAMIDE HCL 5 MG/ML IJ SOLN
10.0000 mg | Freq: Once | INTRAMUSCULAR | Status: AC
  Administered 2017-02-24: 10 mg via INTRAVENOUS
  Filled 2017-02-24: qty 2

## 2017-02-24 MED ORDER — PROMETHAZINE HCL 25 MG/ML IJ SOLN: 12.5000 mg | Freq: Once | INTRAMUSCULAR | Status: DC

## 2017-02-24 MED ORDER — CYCLOBENZAPRINE HCL 10 MG PO TABS
10.0000 mg | ORAL_TABLET | Freq: Once | ORAL | Status: AC
  Administered 2017-02-24: 10 mg via ORAL
  Filled 2017-02-24: qty 1

## 2017-02-24 MED ORDER — NIFEDIPINE 10 MG PO CAPS
20.0000 mg | ORAL_CAPSULE | Freq: Once | ORAL | Status: AC
  Administered 2017-02-24: 20 mg via ORAL
  Filled 2017-02-24: qty 2

## 2017-02-24 NOTE — Discharge Instructions (Signed)

## 2017-02-24 NOTE — MAU Provider Note (Signed)
History     CSN: [id number]  Arrival date and time: 02/24/17 0116   First Provider Initiated Contact with Patient 02/24/17 0151      Chief Complaint  Patient presents with  . Contractions   HPI   [name] is a 31 y.o. female [phone] @ 33w0dhere in MAU with contractions, back pain and nausea. Symptoms started yesterday at 8 pm. She has had contractions in this pregnancy; she denies history of preterm delivery. The back pain is located in the middle of her back "above my butt cheek". She has not taken anything for the pain. She denies vaginal bleeding or leaking of fluid. No recent intercourse.   Patient arrived via EMS.  BS on truck was 177. Patient last ate at 8 pm. Patient says she has not been checking her BS in the morning. She ran out of lancets and was never able to pick them up.    OB History    Gravida Para Term Preterm AB Living   '9 3 3   5 2   ' SAB TAB Ectopic Multiple Live Births   '2 3     2      ' Past Medical History:  Diagnosis Date  . Anemia   . Anxiety    off meds with preg  . Carpal tunnel syndrome   . Chlamydia   . Genital HSV    Last OB: > 89yrgo; Valtrex 500 mg po prn outbreaks  . GERD (gastroesophageal reflux disease)   . Gestational diabetes    diet controlled  . Headache(784.0)   . IBS (irritable bowel syndrome)   . Infection    UTI  . Mood swings (HCVanlue  . Panic attack   . Thyroid disease   . Trichomonas     Past Surgical History:  Procedure Laterality Date  . CESAREAN SECTION N/A 05/20/2013   Procedure: CESAREAN SECTION;  Surgeon: ChShelly BombardMD;  [facility];  Service: Obstetrics;  Laterality: N/A;  . INDUCED ABORTION  2009  . VAGINAL DELIVERY  2007    Family History  Problem Relation Age of Onset  . Depression Mother   . Anxiety disorder Mother   . Breast cancer Other   . Bipolar disorder Brother   . Diabetes Maternal Grandmother   . Cancer Maternal Grandmother   . Hypertension Maternal Grandmother   .  Hypertension Paternal Grandmother   . Autism Unknown   . Schizophrenia Unknown     Social History  Substance Use Topics  . Smoking status: Former SmResearch scientist (life sciences). Smokeless tobacco: Never Used     Comment: was not a daily smoker, tried it  . Alcohol use No     Comment: Twice a month    Allergies:  Allergies  Allergen Reactions  . Zofran [Ondansetron Hcl] Nausea And Vomiting    Prescriptions Prior to Admission  Medication Sig Dispense Refill Last Dose  . acetaminophen (TYLENOL) 325 MG tablet Take 325 mg by mouth every 6 (six) hours as needed.   Taking  . cyclobenzaprine (FLEXERIL) 10 MG tablet Take 1 tablet (10 mg total) by mouth every 8 (eight) hours as needed for muscle spasms. 30 tablet 1 Taking  . docusate sodium (COLACE) 100 MG capsule Take 1 capsule (100 mg total) by mouth 2 (two) times daily. (Patient not taking: Reported on 02/04/2017) 60 capsule 5 Not Taking  . Elastic Bandages & Supports (COMFORT FIT MATERNITY SUPP LG) MISC 1 Units by Does not apply route  daily. (Patient not taking: Reported on 02/04/2017) 1 each 0 Not Taking  . glucose blood (ACCU-CHEK GUIDE) test strip Use 1 strip to test blood glucose 4 times daily. 100 each 5   . Lancets (ACCU-CHEK MULTICLIX) lancets Use 1 lancet for fingerstick 4 times daily for glucose check. 100 each 5   . Prenat w/o A-FeCbn-Meth-FA-DHA (PRENATE MINI) 29-0.6-0.4-350 MG CAPS Take 1 capsule by mouth daily before breakfast. 90 capsule 3 Taking  . promethazine (PHENERGAN) 25 MG tablet Take 0.5-1 tablets (12.5-25 mg total) by mouth every 6 (six) hours as needed for nausea. (Patient not taking: Reported on 01/01/2017) 30 tablet 0 Not Taking  . terconazole (TERAZOL 3) 0.8 % vaginal cream Place 1 applicator vaginally at bedtime. (Patient not taking: Reported on 02/04/2017) 20 g 0 Not Taking  . valACYclovir (VALTREX) 1000 MG tablet Take 1 tablet (1,000 mg total) by mouth daily. 30 tablet 11 Taking   Results for orders placed or performed during the hospital  encounter of 02/24/17 (from the past 48 hour(s))  Urinalysis, Routine w reflex microscopic     Status: Abnormal   Collection Time: 02/24/17  1:20 AM  Result Value Ref Range   Color, Urine YELLOW YELLOW   APPearance CLEAR CLEAR   Specific Gravity, Urine 1.023 1.005 - 1.030   pH 6.0 5.0 - 8.0   Glucose, UA 150 (A) NEGATIVE mg/dL   Hgb urine dipstick NEGATIVE NEGATIVE   Bilirubin Urine NEGATIVE NEGATIVE   Ketones, ur 5 (A) NEGATIVE mg/dL   Protein, ur 30 (A) NEGATIVE mg/dL   Nitrite NEGATIVE NEGATIVE   Leukocytes, UA NEGATIVE NEGATIVE   RBC / HPF 0-5 0 - 5 RBC/hpf   WBC, UA 0-5 0 - 5 WBC/hpf   Bacteria, UA NONE SEEN NONE SEEN   Squamous Epithelial / LPF 0-5 (A) NONE SEEN   Mucous PRESENT   Comprehensive metabolic panel     Status: Abnormal   Collection Time: 02/24/17  2:10 AM  Result Value Ref Range   Sodium 133 (L) 135 - 145 mmol/L   Potassium 4.2 3.5 - 5.1 mmol/L   Chloride 103 101 - 111 mmol/L   CO2 20 (L) 22 - 32 mmol/L   Glucose, Bld 148 (H) 65 - 99 mg/dL   BUN 10 6 - 20 mg/dL   Creatinine, Ser 0.53 0.44 - 1.00 mg/dL   Calcium 9.2 8.9 - 10.3 mg/dL   Total Protein 7.3 6.5 - 8.1 g/dL   Albumin 3.1 (L) 3.5 - 5.0 g/dL   AST 20 15 - 41 U/L   ALT 10 (L) 14 - 54 U/L   Alkaline Phosphatase 63 38 - 126 U/L   Total Bilirubin 0.6 0.3 - 1.2 mg/dL   GFR calc non Af Amer >60 >60 mL/min   GFR calc Af Amer >60 >60 mL/min    Comment: (NOTE) The eGFR has been calculated using the CKD EPI equation. This calculation has not been validated in all clinical situations. eGFR's persistently <60 mL/min signify possible Chronic Kidney Disease.    Anion gap 10 5 - 15   Review of Systems  Constitutional: Negative for fever.  Gastrointestinal: Positive for abdominal pain, nausea and vomiting.  Genitourinary: Negative for dysuria and flank pain.  Musculoskeletal: Positive for back pain.   Physical Exam   Blood pressure 136/64, pulse (!) 105, temperature 98.5 F (36.9 C), temperature source  Oral, resp. rate 18, last menstrual period 07/22/2016, SpO2 96 %.  Physical Exam  Constitutional: She is oriented to person,  place, and time. She appears well-developed and well-nourished. No distress.  GI: Soft. There is no CVA tenderness.  Genitourinary:  Genitourinary Comments: Dilation: Closed Effacement (%): 0 Cervical Position: Posterior Exam by:: J.rasch, NP  Musculoskeletal: Normal range of motion.  Neurological: She is alert and oriented to person, place, and time.  Skin: Skin is warm. She is not diaphoretic.  Psychiatric: Her behavior is normal.   Fetal Tracing: Baseline: 135 bpm Variability: Moderate  Accelerations: 15x15 Decelerations: None Toco: Q4 initially, none following procardia.   MAU Course  Procedures  None  MDM  Procardia 20 mg PO & Flexeril 10 mg PO & Reglan 10 mg PO Patient says she feels much better, contraction pain and back pain gone 0/10 Discussed A1C and non compliance with Dr. Ilda Basset who agrees with starting PO medication at this time; Metformin to be initiated: patient not checking her BS's at home.   Assessment and Plan   A:  1. Gestational diabetes mellitus (GDM) in third trimester, gestational diabetes method of control unspecified   2. Braxton Hicks contractions   3. Back pain affecting pregnancy in third trimester   4. Nausea and vomiting in pregnancy        P:  Discharge home in stable condition Lengthy discussion about importance of testing blood sugars, bringing log book and keeping appointments. Discussed risks of uncontrolled DM in pregnancy including delayed fetal lung maturity, IUFD and shoulder dystocia possibly resulting brachial plexus palsy, brain damage, intrapartum death and extensive obstetric lacerations. Patient verbalizes understanding. Rx: Metformin, Reglan Return to MAU if symptoms worsen Keep next OB appointment. Bring your BS log.   Noni Saupe I, NP 02/24/2017 3:59 AM

## 2017-02-24 NOTE — MAU Note (Signed)
Pt arrived EMS with c/o ctx. Woke up with back pain and abd cramping about an hour ago. C/o nausea as well. Vomited after getting into room. Denies any vaginal discharge or bleeding . Good fetal movement reported

## 2017-03-05 ENCOUNTER — Ambulatory Visit (INDEPENDENT_AMBULATORY_CARE_PROVIDER_SITE_OTHER): Payer: Medicaid Other | Admitting: Obstetrics and Gynecology

## 2017-03-05 VITALS — BP 125/82 | HR 109 | Wt 256.2 lb

## 2017-03-05 DIAGNOSIS — O99820 Streptococcus B carrier state complicating pregnancy: Secondary | ICD-10-CM

## 2017-03-05 DIAGNOSIS — O24419 Gestational diabetes mellitus in pregnancy, unspecified control: Secondary | ICD-10-CM | POA: Diagnosis not present

## 2017-03-05 DIAGNOSIS — A6009 Herpesviral infection of other urogenital tract: Secondary | ICD-10-CM

## 2017-03-05 DIAGNOSIS — O0993 Supervision of high risk pregnancy, unspecified, third trimester: Secondary | ICD-10-CM | POA: Diagnosis not present

## 2017-03-05 DIAGNOSIS — O0990 Supervision of high risk pregnancy, unspecified, unspecified trimester: Secondary | ICD-10-CM

## 2017-03-05 DIAGNOSIS — Z98891 History of uterine scar from previous surgery: Secondary | ICD-10-CM

## 2017-03-05 NOTE — Progress Notes (Signed)
   PRENATAL VISIT NOTE  Subjective:  Melinda Jimenez is a 31 y.o. [id number] at 4211w2d being seen today for ongoing prenatal care.  She is currently monitored for the following issues for this high-risk pregnancy and has Herpes genitalis in women; Goiter, unspecified; Vitamin D deficiency; CARPAL TUNNEL SYNDROME; GERD without esophagitis; Anxiety and depression; Supervision of high risk pregnancy, antepartum; H/O: cesarean section; GBS (group B Streptococcus carrier), +RV culture, currently pregnant; and Gestational diabetes mellitus (GDM) in third trimester on her problem list.  Patient reports increased vaginal discharge.  Contractions: Irregular. Vag. Bleeding: None.  Movement: Present. Denies leaking of fluid.   The following portions of the patient's history were reviewed and updated as appropriate: allergies, current medications, past family history, past medical history, past social history, past surgical history and problem list. Problem list updated.  Objective:   Vitals:   03/05/17 1550  BP: 125/82  Pulse: (!) 109  Weight: 256 lb 3.2 oz (116.2 kg)    Fetal Status: Fetal Heart Rate (bpm): 148 Fundal Height: 36 cm Movement: Present     General:  Alert, oriented and cooperative. Patient is in no acute distress.  Skin: Skin is warm and dry. No rash noted.   Cardiovascular: Normal heart rate noted  Respiratory: Normal respiratory effort, no problems with respiration noted  Abdomen: Soft, gravid, appropriate for gestational age. Pain/Pressure: Absent     Pelvic:  Cervical exam performed Dilation: Closed Effacement (%): 0 Station: Ballotable  Extremities: Normal range of motion.  Edema: Trace  Mental Status: Normal mood and affect. Normal behavior. Normal judgment and thought content.   Assessment and Plan:  Pregnancy: [id number] at 7411w2d  1. Supervision of high risk pregnancy, antepartum Patient is doing well without complaints SSE: normal discharge without evidence of rupture (neg  pooling, neg nitrazine)  2. Gestational diabetes mellitus (GDM) in third trimester, gestational diabetes method of control unspecified Patient stopped metformin due to GI side effects She has been checking CBG immediately after meals not 2 hours as instructed Will have patient come in next week for CBG check Size greater than dates Follow up growth ultrasound on 6/22 Patient with early diagnosis of GDM at 18 weeks- Fetal echo ultrasound ordered today Patient needs baseline protein: creatine ratio at her next appointment (Urine sample discarded before order could be placed)  3. H/O: cesarean section Plan for a repeat c-section at 39 weeks. Scheduled with Dr. Erin FullingHarraway-Riga on 7/22. Patient would like to meet her before her surgery. Will arrange this appointment  4. Herpes genitalis in women Valtrex for prophylaxis starting at 36 weeks  5. GBS (group B Streptococcus carrier), +RV culture, currently pregnant   Preterm labor symptoms and general obstetric precautions including but not limited to vaginal bleeding, contractions, leaking of fluid and fetal movement were reviewed in detail with the patient. Please refer to After Visit Summary for other counseling recommendations.  Return in about 1 week (around 03/12/2017) for ROB with CBG check.   Catalina AntiguaPeggy Tiffaney Heimann, MD

## 2017-03-07 ENCOUNTER — Telehealth: Payer: Self-pay | Admitting: General Practice

## 2017-03-07 NOTE — Telephone Encounter (Signed)
Left message on voicemail regarding appointment with Dr. Erin FullingHarraway-Sagen on 04/04/17 at 1:40pm.

## 2017-03-12 ENCOUNTER — Encounter: Payer: Medicaid Other | Admitting: Obstetrics & Gynecology

## 2017-03-20 ENCOUNTER — Encounter (HOSPITAL_COMMUNITY): Payer: Self-pay

## 2017-03-22 ENCOUNTER — Inpatient Hospital Stay (HOSPITAL_COMMUNITY)
Admission: AD | Admit: 2017-03-22 | Discharge: 2017-03-22 | Disposition: A | Discharge status: Home / Self Care | Payer: Medicaid Other | Source: Ambulatory Visit | Attending: Obstetrics & Gynecology | Admitting: Obstetrics & Gynecology

## 2017-03-22 ENCOUNTER — Encounter (HOSPITAL_COMMUNITY): Payer: Self-pay

## 2017-03-22 DIAGNOSIS — G5600 Carpal tunnel syndrome, unspecified upper limb: Secondary | ICD-10-CM | POA: Insufficient documentation

## 2017-03-22 DIAGNOSIS — O09893 Supervision of other high risk pregnancies, third trimester: Secondary | ICD-10-CM | POA: Diagnosis not present

## 2017-03-22 DIAGNOSIS — R000 Tachycardia, unspecified: Secondary | ICD-10-CM | POA: Insufficient documentation

## 2017-03-22 DIAGNOSIS — K219 Gastro-esophageal reflux disease without esophagitis: Secondary | ICD-10-CM | POA: Insufficient documentation

## 2017-03-22 DIAGNOSIS — K589 Irritable bowel syndrome without diarrhea: Secondary | ICD-10-CM | POA: Insufficient documentation

## 2017-03-22 DIAGNOSIS — Z3A34 34 weeks gestation of pregnancy: Secondary | ICD-10-CM | POA: Diagnosis not present

## 2017-03-22 DIAGNOSIS — Z3689 Encounter for other specified antenatal screening: Secondary | ICD-10-CM

## 2017-03-22 DIAGNOSIS — O99353 Diseases of the nervous system complicating pregnancy, third trimester: Secondary | ICD-10-CM | POA: Insufficient documentation

## 2017-03-22 DIAGNOSIS — O9989 Other specified diseases and conditions complicating pregnancy, childbirth and the puerperium: Secondary | ICD-10-CM

## 2017-03-22 DIAGNOSIS — O26893 Other specified pregnancy related conditions, third trimester: Secondary | ICD-10-CM | POA: Diagnosis present

## 2017-03-22 DIAGNOSIS — O0990 Supervision of high risk pregnancy, unspecified, unspecified trimester: Secondary | ICD-10-CM

## 2017-03-22 LAB — URINALYSIS, ROUTINE W REFLEX MICROSCOPIC
Bilirubin Urine: NEGATIVE
Glucose, UA: >=500 mg/dL — AB
Hgb urine dipstick: NEGATIVE
Ketones, ur: NEGATIVE mg/dL
Leukocytes, UA: NEGATIVE
NITRITE: NEGATIVE
Protein, ur: 30 mg/dL — AB
Specific Gravity, Urine: 1.029 (ref 1.005–1.030)
pH: 5 (ref 5.0–8.0)

## 2017-03-22 LAB — GLUCOSE, CAPILLARY: Glucose-Capillary: 170 mg/dL — ABNORMAL HIGH (ref 65–99)

## 2017-03-22 NOTE — Discharge Instructions (Signed)

## 2017-03-22 NOTE — MAU Provider Note (Signed)
History     CSN: [id number]  Arrival date and time: 03/22/17 2121   First Provider Initiated Contact with Patient 03/22/17 2209      Chief Complaint  Patient presents with  . rapid heartrate  . Dizziness  . Leg Swelling   [id number] @34 .5 wks here with "racing heart beat" earlier today. She felt her heart beating fast around 3 pm this afternoon. She layed down on her left side and had some water. It lasted for about 5 min. She also reports intermittent dizziness episodes for the last 1-2 weeks. She's also concerned that her feet were swollen about 2 days ago. She endorses intermittent SOB over the last week, mostly when she is lying down or when the baby is in a "weird position". She reports blood sugars to 120s after meals and 98 fasting this am. She reports good hydration today, about 10 bottles of water.   OB History    Gravida Para Term Preterm AB Living   9 3 3   5 2    SAB TAB Ectopic Multiple Live Births   2 3     2       Past Medical History:  Diagnosis Date  . Anemia   . Anxiety    off meds with preg  . Carpal tunnel syndrome   . Chlamydia   . Genital HSV    Last OB: > 17yr ago; Valtrex 500 mg po prn outbreaks  . GERD (gastroesophageal reflux disease)   . Gestational diabetes    diet controlled  . Headache(784.0)   . IBS (irritable bowel syndrome)   . Infection    UTI  . Mood swings (HCC)   . Panic attack   . Thyroid disease   . Trichomonas     Past Surgical History:  Procedure Laterality Date  . CESAREAN SECTION N/A 05/20/2013   Procedure: CESAREAN SECTION;  Surgeon: Brock Bad, MD;  Location: [facility];  Service: Obstetrics;  Laterality: N/A;  . INDUCED ABORTION  2009  . VAGINAL DELIVERY  2007    Family History  Problem Relation Age of Onset  . Depression Mother   . Anxiety disorder Mother   . Breast cancer Other   . Bipolar disorder Brother   . Diabetes Maternal Grandmother   . Cancer Maternal Grandmother   . Hypertension Maternal Grandmother    . Hypertension Paternal Grandmother   . Autism Unknown   . Schizophrenia Unknown     Social History  Substance Use Topics  . Smoking status: Former Games developer  . Smokeless tobacco: Never Used     Comment: was not a daily smoker, tried it  . Alcohol use No     Comment: Twice a month    Allergies:  Allergies  Allergen Reactions  . Zofran [Ondansetron Hcl] Nausea And Vomiting    Prescriptions Prior to Admission  Medication Sig Dispense Refill Last Dose  . acetaminophen (TYLENOL) 325 MG tablet Take 325 mg by mouth every 6 (six) hours as needed for moderate pain.    Past Week at Unknown time  . cyclobenzaprine (FLEXERIL) 10 MG tablet Take 1 tablet (10 mg total) by mouth every 8 (eight) hours as needed for muscle spasms. 30 tablet 1 Past Week at Unknown time  . valACYclovir (VALTREX) 1000 MG tablet Take 1 tablet (1,000 mg total) by mouth daily. 30 tablet 11 03/21/2017 at Unknown time  . docusate sodium (COLACE) 100 MG capsule Take 1 capsule (100 mg total) by mouth 2 (two) times daily. (  Patient not taking: Reported on 02/04/2017) 60 capsule 5 Not Taking  . metFORMIN (GLUCOPHAGE) 1000 MG tablet Take 1 tablet (1,000 mg total) by mouth 2 (two) times daily with a meal. (Patient not taking: Reported on 03/22/2017) 30 tablet 0 Not Taking at Unknown time  . metoCLOPramide (REGLAN) 10 MG tablet Take 1 tablet (10 mg total) by mouth 3 (three) times daily before meals. (Patient not taking: Reported on 03/05/2017) 30 tablet 0 Not Taking  . Prenat w/o A-FeCbn-Meth-FA-DHA (PRENATE MINI) 29-0.6-0.4-350 MG CAPS Take 1 capsule by mouth daily before breakfast. (Patient not taking: Reported on 03/05/2017) 90 capsule 3 Not Taking    Review of Systems  Respiratory: Positive for shortness of breath (intermittent).   Cardiovascular: Negative for chest pain.  Gastrointestinal: Negative for abdominal pain.  Neurological: Positive for dizziness.   Physical Exam   Blood pressure 112/72, pulse (!) 109, temperature 97.8 F  (36.6 C), resp. rate 18, height 5\' 8"  (1.727 m), weight 116.1 kg (256 lb), last menstrual period 07/22/2016, SpO2 99 %.  Physical Exam  Nursing note and vitals reviewed. Constitutional: She is oriented to person, place, and time. She appears well-developed and well-nourished.  HENT:  Head: Normocephalic and atraumatic.  Neck: Normal range of motion.  Cardiovascular: Normal rate, regular rhythm and normal heart sounds.   Respiratory: Effort normal and breath sounds normal. No respiratory distress. She has no wheezes. She has no rales.  GI: Soft. She exhibits no distension. There is no tenderness.  gravid  Musculoskeletal: Normal range of motion.  Neurological: She is alert and oriented to person, place, and time.  Skin: Skin is warm and dry.  Psychiatric: She has a normal mood and affect.   EFM: 145 bpm, mod variability, + accels, no decels Toco: none  Results for orders placed or performed during the hospital encounter of 03/22/17 (from the past 24 hour(s))  Urinalysis, Routine w reflex microscopic     Status: Abnormal   Collection Time: 03/22/17  9:40 PM  Result Value Ref Range   Color, Urine YELLOW YELLOW   APPearance CLEAR CLEAR   Specific Gravity, Urine 1.029 1.005 - 1.030   pH 5.0 5.0 - 8.0   Glucose, UA >=500 (A) NEGATIVE mg/dL   Hgb urine dipstick NEGATIVE NEGATIVE   Bilirubin Urine NEGATIVE NEGATIVE   Ketones, ur NEGATIVE NEGATIVE mg/dL   Protein, ur 30 (A) NEGATIVE mg/dL   Nitrite NEGATIVE NEGATIVE   Leukocytes, UA NEGATIVE NEGATIVE   RBC / HPF 0-5 0 - 5 RBC/hpf   WBC, UA 0-5 0 - 5 WBC/hpf   Bacteria, UA RARE (A) NONE SEEN   Squamous Epithelial / LPF 0-5 (A) NONE SEEN   Mucous PRESENT   Glucose, capillary     Status: Abnormal   Collection Time: 03/22/17 10:27 PM  Result Value Ref Range   Glucose-Capillary 170 (H) 65 - 99 mg/dL   MAU Course  Procedures  MDM Labs and EKG ordered. EKG interpreted by Dr. Virgina Organ >normal. No evidence of cardiac pathology. Sx  likely caused by normal pregnancy changes. Discussed with pt BS is high and spilling glucose in urine. She admits to having 4 pieces of pizza and mountain dew for dinner. Discussed importance of good glucose control and dietary choices. She is stable for discharge home.  Assessment and Plan   1. [redacted] weeks gestation of pregnancy   2. Supervision of high risk pregnancy, antepartum   3. NST (non-stress test) reactive   4. Tachycardia    Discharge home  Follow up in office as scheduled Return precautions  Allergies as of 03/22/2017      Reactions   Zofran [ondansetron Hcl] Nausea And Vomiting      Medication List    TAKE these medications   acetaminophen 325 MG tablet Commonly known as:  TYLENOL Take 325 mg by mouth every 6 (six) hours as needed for moderate pain.   cyclobenzaprine 10 MG tablet Commonly known as:  FLEXERIL Take 1 tablet (10 mg total) by mouth every 8 (eight) hours as needed for muscle spasms.   docusate sodium 100 MG capsule Commonly known as:  COLACE Take 1 capsule (100 mg total) by mouth 2 (two) times daily.   metFORMIN 1000 MG tablet Commonly known as:  GLUCOPHAGE Take 1 tablet (1,000 mg total) by mouth 2 (two) times daily with a meal.   metoCLOPramide 10 MG tablet Commonly known as:  REGLAN Take 1 tablet (10 mg total) by mouth 3 (three) times daily before meals.   PRENATE MINI 29-0.6-0.4-350 MG Caps Take 1 capsule by mouth daily before breakfast.   valACYclovir 1000 MG tablet Commonly known as:  VALTREX Take 1 tablet (1,000 mg total) by mouth daily.      Donette LarryMelanie Denna Fryberger, CNM 03/22/2017, 10:13 PM

## 2017-03-22 NOTE — MAU Note (Addendum)
Having dizzy spells. One time today felt like heartrate was going fast but subsided after 5 mins. Swelling in feet and hands. Buring bottom of feet. Occ ctxs but no pain currently

## 2017-03-23 ENCOUNTER — Encounter (HOSPITAL_COMMUNITY): Payer: Self-pay | Admitting: *Deleted

## 2017-03-23 ENCOUNTER — Inpatient Hospital Stay (HOSPITAL_COMMUNITY)
Admission: AD | Admit: 2017-03-23 | Discharge: 2017-03-24 | DRG: 778 | Disposition: A | Discharge status: Home / Self Care | Payer: Medicaid Other | Source: Ambulatory Visit | Attending: Obstetrics and Gynecology | Admitting: Obstetrics and Gynecology

## 2017-03-23 DIAGNOSIS — Z87891 Personal history of nicotine dependence: Secondary | ICD-10-CM

## 2017-03-23 DIAGNOSIS — A6000 Herpesviral infection of urogenital system, unspecified: Secondary | ICD-10-CM | POA: Diagnosis present

## 2017-03-23 DIAGNOSIS — O24410 Gestational diabetes mellitus in pregnancy, diet controlled: Secondary | ICD-10-CM | POA: Diagnosis present

## 2017-03-23 DIAGNOSIS — O0990 Supervision of high risk pregnancy, unspecified, unspecified trimester: Secondary | ICD-10-CM

## 2017-03-23 DIAGNOSIS — O34211 Maternal care for low transverse scar from previous cesarean delivery: Secondary | ICD-10-CM | POA: Diagnosis present

## 2017-03-23 DIAGNOSIS — O99820 Streptococcus B carrier state complicating pregnancy: Secondary | ICD-10-CM | POA: Diagnosis present

## 2017-03-23 DIAGNOSIS — O4703 False labor before 37 completed weeks of gestation, third trimester: Secondary | ICD-10-CM

## 2017-03-23 DIAGNOSIS — O98313 Other infections with a predominantly sexual mode of transmission complicating pregnancy, third trimester: Secondary | ICD-10-CM | POA: Diagnosis present

## 2017-03-23 DIAGNOSIS — Z3A34 34 weeks gestation of pregnancy: Secondary | ICD-10-CM

## 2017-03-23 LAB — URINALYSIS, ROUTINE W REFLEX MICROSCOPIC
BACTERIA UA: NONE SEEN
Bilirubin Urine: NEGATIVE
GLUCOSE, UA: NEGATIVE mg/dL
Hgb urine dipstick: NEGATIVE
Ketones, ur: 80 mg/dL — AB
LEUKOCYTES UA: NEGATIVE
Nitrite: NEGATIVE
PH: 5 (ref 5.0–8.0)
PROTEIN: 100 mg/dL — AB
Specific Gravity, Urine: 1.021 (ref 1.005–1.030)

## 2017-03-23 LAB — GLUCOSE, CAPILLARY: Glucose-Capillary: 94 mg/dL (ref 65–99)

## 2017-03-23 MED ORDER — MORPHINE SULFATE (PF) 10 MG/ML IV SOLN
10.0000 mg | Freq: Once | INTRAVENOUS | Status: AC
  Administered 2017-03-23: 10 mg via INTRAMUSCULAR
  Filled 2017-03-23: qty 1

## 2017-03-23 MED ORDER — BETAMETHASONE SOD PHOS & ACET 6 (3-3) MG/ML IJ SUSP
12.0000 mg | INTRAMUSCULAR | Status: DC
Start: 2017-03-23 — End: 2017-03-24
  Administered 2017-03-23: 12 mg via INTRAMUSCULAR
  Filled 2017-03-23: qty 2

## 2017-03-23 MED ORDER — NIFEDIPINE 10 MG PO CAPS
10.0000 mg | ORAL_CAPSULE | ORAL | Status: AC
  Administered 2017-03-23 (×3): 10 mg via ORAL
  Filled 2017-03-23 (×3): qty 1

## 2017-03-23 MED ORDER — TERBUTALINE SULFATE 1 MG/ML IJ SOLN
0.2500 mg | Freq: Once | INTRAMUSCULAR | Status: AC
  Administered 2017-03-23: 0.25 mg via SUBCUTANEOUS
  Filled 2017-03-23: qty 1

## 2017-03-23 MED ORDER — NIFEDIPINE ER OSMOTIC RELEASE 30 MG PO TB24
30.0000 mg | ORAL_TABLET | Freq: Every day | ORAL | Status: DC
  Administered 2017-03-23: 30 mg via ORAL
  Filled 2017-03-23: qty 1

## 2017-03-23 MED ORDER — LACTATED RINGERS IV BOLUS (SEPSIS)
1000.0000 mL | Freq: Once | INTRAVENOUS | Status: AC
  Administered 2017-03-23: 1000 mL via INTRAVENOUS

## 2017-03-23 NOTE — MAU Note (Signed)
Urine sent to lab @2016 .

## 2017-03-23 NOTE — MAU Note (Signed)
Ctxs since 1800 and leaking clear fld since then as well.

## 2017-03-23 NOTE — MAU Note (Signed)
PT  SAYS  SHE  HAD  SEX- AT  5 PM   AND  THEN THINKS  SROM AT 6 PM  AND UC STARTED AT 6 PM.  [facility]  [facility].    HX- HSV - LAST OUTBREAK -  NONE   [facility]  [facility]-              [facility]

## 2017-03-23 NOTE — MAU Provider Note (Signed)
History     CSN: [id number]  Arrival date and time: 03/23/17 7829   First Provider Initiated Contact with Patient 03/23/17 2029      Chief Complaint  Patient presents with  . Contractions  . Vaginal Discharge   [id number] @34 .6 wks here with LOF and ctx. She reports loss of clear fluid about 1 hr after IC (1800), then shortly after started having ctx. Reports good FM. Pregnancy complicated by GDM, previous CS, GBS+, h/o HSV.    OB History    Gravida Para Term Preterm AB Living   9 3 3   5 2    SAB TAB Ectopic Multiple Live Births   2 3     2       Past Medical History:  Diagnosis Date  . Anemia   . Anxiety    off meds with preg  . Carpal tunnel syndrome   . Chlamydia   . 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 GERD (gastroesophageal reflux disease)   . Gestational diabetes    diet controlled  . Headache(784.0)   . IBS (irritable bowel syndrome)   . Infection    UTI  . Mood swings (HCC)   . Panic attack   . Thyroid disease   . Trichomonas     Past Surgical History:  Procedure Laterality Date  . CESAREAN SECTION N/A 05/20/2013   Procedure: CESAREAN SECTION;  Surgeon: Brock Bad, MD;  Location: [facility];  Service: Obstetrics;  Laterality: N/A;  . INDUCED ABORTION  2009  . VAGINAL DELIVERY  2007    Family History  Problem Relation Age of Onset  . Depression Mother   . Anxiety disorder Mother   . Breast cancer Other   . Bipolar disorder Brother   . Diabetes Maternal Grandmother   . Cancer Maternal Grandmother   . Hypertension Maternal Grandmother   . Hypertension Paternal Grandmother   . Autism Unknown   . Schizophrenia Unknown     Social History  Substance Use Topics  . Smoking status: Former Games developer  . Smokeless tobacco: Never Used     Comment: was not a daily smoker, tried it  . Alcohol use No     Comment: Twice a month    Allergies:  Allergies  Allergen Reactions  . Zofran [Ondansetron Hcl] Nausea And Vomiting     Prescriptions Prior to Admission  Medication Sig Dispense Refill Last Dose  . cyclobenzaprine (FLEXERIL) 10 MG tablet Take 1 tablet (10 mg total) by mouth every 8 (eight) hours as needed for muscle spasms. 30 tablet 1 Past Week at Unknown time  . valACYclovir (VALTREX) 1000 MG tablet Take 1 tablet (1,000 mg total) by mouth daily. 30 tablet 11 Past Week at Unknown time  . acetaminophen (TYLENOL) 325 MG tablet Take 325 mg by mouth every 6 (six) hours as needed for moderate pain.    Past Week at Unknown time  . docusate sodium (COLACE) 100 MG capsule Take 1 capsule (100 mg total) by mouth 2 (two) times daily. (Patient not taking: Reported on 02/04/2017) 60 capsule 5 Not Taking  . metFORMIN (GLUCOPHAGE) 1000 MG tablet Take 1 tablet (1,000 mg total) by mouth 2 (two) times daily with a meal. (Patient not taking: Reported on 03/22/2017) 30 tablet 0 Not Taking at Unknown time  . metoCLOPramide (REGLAN) 10 MG tablet Take 1 tablet (10 mg total) by mouth 3 (three) times daily before meals. (Patient not taking:  Reported on 03/05/2017) 30 tablet 0 Not Taking  . Prenat w/o A-FeCbn-Meth-FA-DHA (PRENATE MINI) 29-0.6-0.4-350 MG CAPS Take 1 capsule by mouth daily before breakfast. (Patient not taking: Reported on 03/05/2017) 90 capsule 3 Not Taking    Review of Systems Physical Exam   Blood pressure 108/77, pulse (!) 104, temperature 98.2 F (36.8 C), resp. rate 18, height 5\' 8"  (1.727 m), weight 115.2 kg (254 lb), last menstrual period 07/22/2016, SpO2 97 %.  Physical Exam  Constitutional: She is oriented to person, place, and time. She appears well-developed and well-nourished. No distress.  HENT:  Head: Normocephalic and atraumatic.  Neck: Normal range of motion.  Respiratory: Effort normal.  GI: Soft. She exhibits no distension. There is no tenderness.  gravid  Genitourinary:  Genitourinary Comments: External: no lesions or erythema Vagina: rugated, pink, moist, small milky discharge, no pool, fern  negative SVE: 2/70/-2, vtx   Musculoskeletal: Normal range of motion.  Neurological: She is alert and oriented to person, place, and time.  Skin: Skin is warm and dry.  Psychiatric: She has a normal mood and affect.   Results for orders placed or performed during the hospital encounter of 03/23/17 (from the past 24 hour(s))  Urinalysis, Routine w reflex microscopic     Status: Abnormal   Collection Time: 03/23/17  8:05 PM  Result Value Ref Range   Color, Urine YELLOW YELLOW   APPearance HAZY (A) CLEAR   Specific Gravity, Urine 1.021 1.005 - 1.030   pH 5.0 5.0 - 8.0   Glucose, UA NEGATIVE NEGATIVE mg/dL   Hgb urine dipstick NEGATIVE NEGATIVE   Bilirubin Urine NEGATIVE NEGATIVE   Ketones, ur 80 (A) NEGATIVE mg/dL   Protein, ur 161100 (A) NEGATIVE mg/dL   Nitrite NEGATIVE NEGATIVE   Leukocytes, UA NEGATIVE NEGATIVE   RBC / HPF 0-5 0 - 5 RBC/hpf   WBC, UA 0-5 0 - 5 WBC/hpf   Bacteria, UA NONE SEEN NONE SEEN   Squamous Epithelial / LPF 0-5 (A) NONE SEEN   Mucous PRESENT    Sperm, UA PRESENT   Glucose, capillary     Status: None   Collection Time: 03/23/17  8:46 PM  Result Value Ref Range   Glucose-Capillary 94 65 - 99 mg/dL   MAU Course  Procedures LR 1 L Procardia x3 Terbutaline x2 Betamethasone Morphine Procardia XL  MDM Labs ordered and reviewed. Cervix recheck shows some change > 3/70/-2, ctx continue q2-4, pt reports slight improvement. Dr. Alysia PennaErvin updated. Dr. Genevie AnnSchenk and Dr. Alysia PennaErvin in to see pt, ctx spaced, Nifedipine XL ordered. Will recheck in few hrs and consider discharge home if no change. Cervix unchanged but continue to have reg ctx and becoming uncomfortable again. Dr. Genevie AnnSchenk notified, plan for admit.  Assessment and Plan  34.6 weeks Preterm contractions Admit to Ante See H&P  Donette LarryMelanie Serafina Topham, CNM 03/23/2017, 8:43 PM

## 2017-03-24 DIAGNOSIS — Z87891 Personal history of nicotine dependence: Secondary | ICD-10-CM | POA: Diagnosis not present

## 2017-03-24 DIAGNOSIS — Z3A34 34 weeks gestation of pregnancy: Secondary | ICD-10-CM | POA: Diagnosis not present

## 2017-03-24 DIAGNOSIS — A6000 Herpesviral infection of urogenital system, unspecified: Secondary | ICD-10-CM | POA: Diagnosis present

## 2017-03-24 DIAGNOSIS — O34211 Maternal care for low transverse scar from previous cesarean delivery: Secondary | ICD-10-CM | POA: Diagnosis present

## 2017-03-24 DIAGNOSIS — O99820 Streptococcus B carrier state complicating pregnancy: Secondary | ICD-10-CM | POA: Diagnosis present

## 2017-03-24 DIAGNOSIS — O98313 Other infections with a predominantly sexual mode of transmission complicating pregnancy, third trimester: Secondary | ICD-10-CM | POA: Diagnosis present

## 2017-03-24 DIAGNOSIS — O24410 Gestational diabetes mellitus in pregnancy, diet controlled: Secondary | ICD-10-CM | POA: Diagnosis present

## 2017-03-24 LAB — TYPE AND SCREEN
ABO/RH(D): A POS
ANTIBODY SCREEN: NEGATIVE

## 2017-03-24 LAB — GLUCOSE, CAPILLARY: GLUCOSE-CAPILLARY: 153 mg/dL — AB (ref 65–99)

## 2017-03-24 MED ORDER — SODIUM CHLORIDE 0.9 % IV SOLN: 250.0000 mL | INTRAVENOUS | Status: DC | PRN

## 2017-03-24 MED ORDER — SODIUM CHLORIDE 0.9% FLUSH: 3.0000 mL | INTRAVENOUS | Status: DC | PRN

## 2017-03-24 MED ORDER — PRENATAL MULTIVITAMIN CH
1.0000 | ORAL_TABLET | Freq: Every day | ORAL | Status: DC
  Filled 2017-03-24: qty 1

## 2017-03-24 MED ORDER — BUTORPHANOL TARTRATE 1 MG/ML IJ SOLN: 1.0000 mg | INTRAMUSCULAR | Status: DC | PRN

## 2017-03-24 MED ORDER — DOCUSATE SODIUM 100 MG PO CAPS
100.0000 mg | ORAL_CAPSULE | Freq: Every day | ORAL | Status: DC
  Filled 2017-03-24: qty 1

## 2017-03-24 MED ORDER — NIFEDIPINE ER OSMOTIC RELEASE 30 MG PO TB24: 30.0000 mg | ORAL_TABLET | Freq: Two times a day (BID) | ORAL | Status: DC

## 2017-03-24 MED ORDER — NIFEDIPINE ER 30 MG PO TB24: 30.0000 mg | ORAL_TABLET | Freq: Two times a day (BID) | ORAL | 0 refills | Status: DC

## 2017-03-24 MED ORDER — SODIUM CHLORIDE 0.9% FLUSH
3.0000 mL | Freq: Two times a day (BID) | INTRAVENOUS | Status: DC
  Administered 2017-03-24: 3 mL via INTRAVENOUS

## 2017-03-24 MED ORDER — ZOLPIDEM TARTRATE 5 MG PO TABS: 5.0000 mg | ORAL_TABLET | Freq: Every evening | ORAL | Status: DC | PRN

## 2017-03-24 MED ORDER — ACETAMINOPHEN 325 MG PO TABS: 650.0000 mg | ORAL_TABLET | ORAL | Status: DC | PRN

## 2017-03-24 MED ORDER — CALCIUM CARBONATE ANTACID 500 MG PO CHEW: 2.0000 | CHEWABLE_TABLET | ORAL | Status: DC | PRN

## 2017-03-24 NOTE — H&P (Signed)
Melinda Jimenez is a 31 y.o. female presenting for post coital preterm uterine contractions. Evaluation in MAU revealed slight cervical change initially (2>3cm) then no further change however she continued to have regular painful contractions. Her pregnancy is complicated by A1GDM w/some noncompliance, previous CS (planning rpt), hx HSV-no recent outbreak, and GBS carrier.   OB History    Gravida Para Term Preterm AB Living   9 3 3   5 2    SAB TAB Ectopic Multiple Live Births   2 3     2      Past Medical History:  Diagnosis Date  . Anemia   . Anxiety    off meds with preg  . Carpal tunnel syndrome   . Chlamydia   . Genital HSV    Last OB: > 64yr ago; Valtrex 500 mg po prn outbreaks  . GERD (gastroesophageal reflux disease)   . Gestational diabetes    diet controlled  . Headache(784.0)   . IBS (irritable bowel syndrome)   . Infection    UTI  . Mood swings (HCC)   . Panic attack   . Thyroid disease   . Trichomonas    Past Surgical History:  Procedure Laterality Date  . CESAREAN SECTION N/A 05/20/2013   Procedure: CESAREAN SECTION;  Surgeon: Brock Bad, MD;  Location: [facility];  Service: Obstetrics;  Laterality: N/A;  . INDUCED ABORTION  2009  . VAGINAL DELIVERY  2007   Family History: family history includes Anxiety disorder in her mother; Bipolar disorder in her brother; Breast cancer in her other; Cancer in her maternal grandmother; Depression in her mother; Diabetes in her maternal grandmother; Hypertension in her maternal grandmother and paternal grandmother. Social History:  reports that she has quit smoking. She has never used smokeless tobacco. She reports that she does not drink alcohol or use drugs.     Maternal Diabetes: Yes:  Diabetes Type:  Diet controlled Genetic Screening: Normal Maternal Ultrasounds/Referrals: Normal Fetal Ultrasounds or other Referrals:  None Maternal Substance Abuse:  No Significant Maternal Medications:  None Significant Maternal Lab  Results:  Lab values include: Group B Strep positive Other Comments:  None  Review of Systems  Constitutional: Negative.   Gastrointestinal: Positive for abdominal pain.   Maternal Medical History:  Reason for admission: Contractions.   Contractions: Onset was 6-12 hours ago.   Frequency: regular.   Perceived severity is moderate.    Fetal activity: Perceived fetal activity is normal.   Last perceived fetal movement was within the past hour.    Prenatal Complications - Diabetes: gestational. Diabetes is managed by diet.      Dilation: 3 Effacement (%): 70 Station: -2 Exam by:: Avis Tirone B., cnm Blood pressure 94/63, pulse (!) 128, temperature 98.2 F (36.8 C), resp. rate 18, height 5\' 8"  (1.727 m), weight 115.2 kg (254 lb), last menstrual period 07/22/2016, SpO2 98 %. Maternal Exam:  Uterine Assessment: Contraction strength is moderate.  Contraction duration is 2 minutes. Contraction frequency is regular.   Abdomen: Patient reports no abdominal tenderness. Fetal presentation: vertex  Introitus: Normal vulva. Normal vagina.    Fetal Exam Fetal Monitor Review: Mode: ultrasound.   Baseline rate: 145.  Variability: moderate (6-25 bpm).   Pattern: accelerations present and no decelerations.    Fetal State Assessment: Category I - tracings are normal.     Physical Exam  Nursing note and vitals reviewed. Constitutional: She is oriented to person, place, and time. She appears well-developed and well-nourished. No distress.  HENT:  Head: Normocephalic and atraumatic.  Neck: Normal range of motion.  Cardiovascular: Normal rate.   Respiratory: Effort normal.  GI: Soft. She exhibits no distension. There is no tenderness.  Musculoskeletal: Normal range of motion.  Neurological: She is alert and oriented to person, place, and time.  Skin: Skin is warm and dry.  Psychiatric: She has a normal mood and affect.    Prenatal labs: ABO, Rh: A/Positive/-- (12/28 1342) Antibody:  Negative (12/28 1342) Rubella: 2.29 (12/28 1342) RPR: Non Reactive (05/07 1701)  HBsAg: Negative (12/28 1342)  HIV: Non Reactive (05/07 1701)  GBS:     Assessment/Plan: 34.[redacted] weeks gestation Preterm contractions A1GDM Admit to Antepartum unit Procardia XL bid Continue Betamethasone series Management per attending MD    Donette LarryMelanie Trustin Jimenez 03/24/2017, 12:05 AM

## 2017-03-24 NOTE — Discharge Summary (Signed)
Physician Discharge Summary  Patient ID: Melinda Jimenez MRN: [id number] DOB/AGE: 1986/09/12 31 y.o.  Admit date: 03/23/2017 Discharge date: 03/24/2017  Admission Diagnoses: IUP 34 56/7 weeks                                         Preterm Uterine contractions                                         GDM                                         Prior c section  Discharge Diagnoses: SAA                                          Undeliveried   Discharged Condition: good  Hospital Course: Pt had intercourse @ 5 PM day of admission. Started had uuerine contractions shortly afterwards. No VB. Pt was given oral tocolytics in the MAU and slowly responded. However since priro c section and desire for repeat and preterm. Pt observed over night with continuation of oral tocolytics. Pt had resolution of uterine contractions.  Cervix remained unchanged as well.Pt did receive one dose of BMZ. Felted amendable for discharge home with continue of preadmission labs and Procardia bid.   Consults: None  Significant Diagnostic Studies: none  Treatments: IV hydration, analgesia: Morphine and Procardial  Discharge Exam: Blood pressure (!) 107/59, pulse (!) 113, temperature 98.6 F (37 C), temperature source Oral, resp. rate 18, height 5\' 8"  (1.727 m), weight 115.2 kg (254 lb), last menstrual period 07/22/2016, SpO2 100 %.  Lungs clear Heart RRR Abd soft + BS gravid non tender GU 3/50/-2 Ext non tender  Disposition: 01-Home or Self Care  Discharge Instructions    Discharge activity:  No Restrictions    Complete by:  As directed    Pelvic rest   Discharge diet:    Complete by:  As directed    2000 cal ADA   Do not have sex or do anything that might make you have an orgasm    Complete by:  As directed    Fetal Kick Count:  Lie on our left side for one hour after a meal, and count the number of times your baby kicks.  If it is less than 5 times, get up, move around and drink some juice.  Repeat the  test 30 minutes later.  If it is still less than 5 kicks in an hour, notify your doctor.    Complete by:  As directed    LABOR:  When conractions begin, you should start to time them from the beginning of one contraction to the beginning  of the next.  When contractions are 5 - 10 minutes apart or less and have been regular for at least an hour, you should call your health care provider.    Complete by:  As directed    Notify physician for bleeding from the vagina    Complete by:  As directed    Notify physician for blurring of vision or spots before the  eyes    Complete by:  As directed    Notify physician for chills or fever    Complete by:  As directed    Notify physician for fainting spells, "black outs" or loss of consciousness    Complete by:  As directed    Notify physician for increase in vaginal discharge    Complete by:  As directed    Notify physician for leaking of fluid    Complete by:  As directed    Notify physician for pain or burning when urinating    Complete by:  As directed    Notify physician for pelvic pressure (sudden increase)    Complete by:  As directed    Notify physician for severe or continued nausea or vomiting    Complete by:  As directed    Notify physician for sudden gushing of fluid from the vagina (with or without continued leaking)    Complete by:  As directed    Notify physician for sudden, constant, or occasional abdominal pain    Complete by:  As directed    Notify physician if baby moving less than usual    Complete by:  As directed      Allergies as of 03/24/2017      Reactions   Zofran [ondansetron Hcl] Nausea And Vomiting      Medication List    TAKE these medications   acetaminophen 325 MG tablet Commonly known as:  TYLENOL Take 325 mg by mouth every 6 (six) hours as needed for moderate pain.   cyclobenzaprine 10 MG tablet Commonly known as:  FLEXERIL Take 1 tablet (10 mg total) by mouth every 8 (eight) hours as needed for muscle  spasms.   docusate sodium 100 MG capsule Commonly known as:  COLACE Take 1 capsule (100 mg total) by mouth 2 (two) times daily.   metFORMIN 1000 MG tablet Commonly known as:  GLUCOPHAGE Take 1 tablet (1,000 mg total) by mouth 2 (two) times daily with a meal.   metoCLOPramide 10 MG tablet Commonly known as:  REGLAN Take 1 tablet (10 mg total) by mouth 3 (three) times daily before meals.   NIFEdipine 30 MG 24 hr tablet Commonly known as:  PROCARDIA-XL/ADALAT CC Take 1 tablet (30 mg total) by mouth 2 (two) times daily.   PRENATE MINI 29-0.6-0.4-350 MG Caps Take 1 capsule by mouth daily before breakfast.   valACYclovir 1000 MG tablet Commonly known as:  VALTREX Take 1 tablet (1,000 mg total) by mouth daily.      Follow-up Information    [facility] Follow up.   Why:  Pt already has appt next week Contact information: [address] [place] [address] [phone]          Signed: Hermina Staggers 03/24/2017, 7:26 AM

## 2017-03-26 ENCOUNTER — Encounter (HOSPITAL_COMMUNITY): Payer: Self-pay

## 2017-03-26 ENCOUNTER — Ambulatory Visit (HOSPITAL_COMMUNITY)
Admission: RE | Admit: 2017-03-26 | Discharge: 2017-03-26 | Disposition: A | Discharge status: Home / Self Care | Payer: Medicaid Other | Source: Ambulatory Visit | Attending: Certified Nurse Midwife | Admitting: Certified Nurse Midwife

## 2017-03-26 ENCOUNTER — Inpatient Hospital Stay (HOSPITAL_COMMUNITY)
Admission: AD | Admit: 2017-03-26 | Discharge: 2017-03-26 | Disposition: A | Discharge status: Home / Self Care | Payer: Medicaid Other | Source: Ambulatory Visit | Attending: Obstetrics and Gynecology | Admitting: Obstetrics and Gynecology

## 2017-03-26 ENCOUNTER — Other Ambulatory Visit (HOSPITAL_COMMUNITY): Payer: Self-pay | Admitting: Maternal and Fetal Medicine

## 2017-03-26 DIAGNOSIS — Z87891 Personal history of nicotine dependence: Secondary | ICD-10-CM | POA: Diagnosis not present

## 2017-03-26 DIAGNOSIS — O24415 Gestational diabetes mellitus in pregnancy, controlled by oral hypoglycemic drugs: Secondary | ICD-10-CM | POA: Diagnosis not present

## 2017-03-26 DIAGNOSIS — Z7984 Long term (current) use of oral hypoglycemic drugs: Secondary | ICD-10-CM | POA: Insufficient documentation

## 2017-03-26 DIAGNOSIS — Z3A35 35 weeks gestation of pregnancy: Secondary | ICD-10-CM | POA: Insufficient documentation

## 2017-03-26 DIAGNOSIS — K589 Irritable bowel syndrome without diarrhea: Secondary | ICD-10-CM | POA: Insufficient documentation

## 2017-03-26 DIAGNOSIS — O99283 Endocrine, nutritional and metabolic diseases complicating pregnancy, third trimester: Secondary | ICD-10-CM | POA: Insufficient documentation

## 2017-03-26 DIAGNOSIS — O4703 False labor before 37 completed weeks of gestation, third trimester: Secondary | ICD-10-CM | POA: Diagnosis not present

## 2017-03-26 DIAGNOSIS — O0990 Supervision of high risk pregnancy, unspecified, unspecified trimester: Secondary | ICD-10-CM

## 2017-03-26 DIAGNOSIS — O24419 Gestational diabetes mellitus in pregnancy, unspecified control: Secondary | ICD-10-CM

## 2017-03-26 DIAGNOSIS — O99343 Other mental disorders complicating pregnancy, third trimester: Secondary | ICD-10-CM | POA: Diagnosis not present

## 2017-03-26 DIAGNOSIS — G5600 Carpal tunnel syndrome, unspecified upper limb: Secondary | ICD-10-CM | POA: Insufficient documentation

## 2017-03-26 DIAGNOSIS — K219 Gastro-esophageal reflux disease without esophagitis: Secondary | ICD-10-CM | POA: Insufficient documentation

## 2017-03-26 DIAGNOSIS — F410 Panic disorder [episodic paroxysmal anxiety] without agoraphobia: Secondary | ICD-10-CM | POA: Diagnosis not present

## 2017-03-26 DIAGNOSIS — E079 Disorder of thyroid, unspecified: Secondary | ICD-10-CM | POA: Diagnosis not present

## 2017-03-26 DIAGNOSIS — O99613 Diseases of the digestive system complicating pregnancy, third trimester: Secondary | ICD-10-CM | POA: Diagnosis not present

## 2017-03-26 DIAGNOSIS — Z98891 History of uterine scar from previous surgery: Secondary | ICD-10-CM

## 2017-03-26 DIAGNOSIS — O99213 Obesity complicating pregnancy, third trimester: Secondary | ICD-10-CM

## 2017-03-26 MED ORDER — LACTATED RINGERS IV BOLUS (SEPSIS): 1000.0000 mL | Freq: Once | INTRAVENOUS | Status: DC

## 2017-03-26 MED ORDER — NIFEDIPINE 10 MG PO CAPS
10.0000 mg | ORAL_CAPSULE | ORAL | Status: AC
  Administered 2017-03-26 (×2): 10 mg via ORAL
  Filled 2017-03-26 (×2): qty 1

## 2017-03-26 NOTE — MAU Note (Signed)
Pt states that it feels like "baby is balled up" for over two hours. States she was seen the other night for PTL, was given medicine for contractions. States she last took a dose around 2am-has helped some but she can still feel pain. Pt denies LOF or vaginal bleeding. Reports fetal movement. States cervix was 3cm on exam.

## 2017-03-26 NOTE — MAU Note (Signed)
Pt states pain is better and that she is not feeling contractions. Pt states she is ready to go home.

## 2017-03-26 NOTE — MAU Provider Note (Signed)
History     CSN: [id number]  Arrival date and time: 03/26/17 [id number]   First Provider Initiated Contact with Patient 03/26/17 0424      Chief Complaint  Patient presents with  . Contractions   HPI  Ms. Denver Fasterikki Cargo is a 31 yo [id number] at 35.[redacted] wks gestation presenting with complaints of contractions and a lot of vaginal pressure tonight.  She reports last taking Procardia at 0200 this AM. She reports (+) FM. Denies VB or LOF.  Past Medical History:  Diagnosis Date  . Anemia   . Anxiety    off meds with preg  . Carpal tunnel syndrome   . Chlamydia   . Genital HSV    Last OB: > 4852yr ago; Valtrex 500 mg po prn outbreaks  . GERD (gastroesophageal reflux disease)   . Gestational diabetes    diet controlled  . Headache(784.0)   . IBS (irritable bowel syndrome)   . Infection    UTI  . Mood swings (HCC)   . Panic attack   . Thyroid disease   . Trichomonas     Past Surgical History:  Procedure Laterality Date  . CESAREAN SECTION N/A 05/20/2013   Procedure: CESAREAN SECTION;  Surgeon: [name], MD;  Location: [facility];  Service: Obstetrics;  Laterality: N/A;  . INDUCED ABORTION  2009  . VAGINAL DELIVERY  2007    Family History  Problem Relation Age of Onset  . Depression Mother   . Anxiety disorder Mother   . Breast cancer Other   . Bipolar disorder Brother   . Diabetes Maternal Grandmother   . Cancer Maternal Grandmother   . Hypertension Maternal Grandmother   . Hypertension Paternal Grandmother   . Autism Unknown   . Schizophrenia Unknown     Social History  Substance Use Topics  . Smoking status: Former Games developermoker  . Smokeless tobacco: Never Used     Comment: was not a daily smoker, tried it  . Alcohol use No     Comment: Twice a month    Allergies:  Allergies  Allergen Reactions  . Zofran [Ondansetron Hcl] Nausea And Vomiting    Prescriptions Prior to Admission  Medication Sig Dispense Refill Last Dose  . NIFEdipine (PROCARDIA-XL/ADALAT CC) 30 MG 24  hr tablet Take 1 tablet (30 mg total) by mouth 2 (two) times daily. 60 tablet 0 03/26/2017 at 0200  . acetaminophen (TYLENOL) 325 MG tablet Take 325 mg by mouth every 6 (six) hours as needed for moderate pain.    Past Week at Unknown time  . cyclobenzaprine (FLEXERIL) 10 MG tablet Take 1 tablet (10 mg total) by mouth every 8 (eight) hours as needed for muscle spasms. 30 tablet 1 Past Week at Unknown time  . docusate sodium (COLACE) 100 MG capsule Take 1 capsule (100 mg total) by mouth 2 (two) times daily. (Patient not taking: Reported on 02/04/2017) 60 capsule 5 Not Taking  . metFORMIN (GLUCOPHAGE) 1000 MG tablet Take 1 tablet (1,000 mg total) by mouth 2 (two) times daily with a meal. (Patient not taking: Reported on 03/22/2017) 30 tablet 0 Not Taking at Unknown time  . metoCLOPramide (REGLAN) 10 MG tablet Take 1 tablet (10 mg total) by mouth 3 (three) times daily before meals. (Patient not taking: Reported on 03/05/2017) 30 tablet 0 Not Taking  . Prenat w/o A-FeCbn-Meth-FA-DHA (PRENATE MINI) 29-0.6-0.4-350 MG CAPS Take 1 capsule by mouth daily before breakfast. (Patient not taking: Reported on 03/05/2017) 90 capsule 3 Not Taking  .  valACYclovir (VALTREX) 1000 MG tablet Take 1 tablet (1,000 mg total) by mouth daily. 30 tablet 11 Past Week at Unknown time    Review of Systems  Constitutional: Negative.   HENT: Negative.   Eyes: Negative.   Respiratory: Negative.   Cardiovascular: Negative.   Gastrointestinal: Negative.   Endocrine: Negative.   Genitourinary: Positive for pelvic pain (UC's and lots of pressure).  Musculoskeletal: Negative.   Skin: Negative.   Allergic/Immunologic: Negative.   Hematological: Negative.   Psychiatric/Behavioral: Negative.    Physical Exam   Blood pressure 108/70, pulse (!) 101, temperature 98.4 F (36.9 C), temperature source Oral, resp. rate 20, last menstrual period 07/22/2016, SpO2 96 %.  Physical Exam  Constitutional: She is oriented to person, place, and time.  She appears well-developed and well-nourished.  HENT:  Head: Normocephalic and atraumatic.  Eyes: Pupils are equal, round, and reactive to light.  Neck: Normal range of motion.  Cardiovascular: Normal rate, regular rhythm, normal heart sounds and intact distal pulses.   Respiratory: Effort normal and breath sounds normal.  GI: Soft. Bowel sounds are normal.  Genitourinary:  Genitourinary Comments: Uterus: gravid, S=D, cx: very posterior and difficult to reach by CNM & RN - pt unable to tolerate any further   Musculoskeletal: Normal range of motion.  Neurological: She is alert and oriented to person, place, and time. She has normal reflexes.  Skin: Skin is warm and dry.  Psychiatric: She has a normal mood and affect. Her behavior is normal. Judgment and thought content normal.   Dilation:  (unchanged; very hard posterior and hard to reach by CNM and RN) Cervical Position: Posterior Exam by:: Raelyn Mora CNM/Danielle Simpson RN  MAU Course  Procedures  MDM NST: FHR: 145 bpm / moderate variability / accels present / decels absent TOCO: regular every 3-5 mins LR 1000 ml bolus - patient refused Procardia 10 mg po every 20 mins x 3 doses --> only 2 doses needed / patient requesting to go home stating she was "just tired"  Assessment and Plan  Preterm uterine contractions in third trimester, antepartum - Continue Procardia 10 mg as previously prescribed - Keep scheduled appt on 03/26/17 for U/S and OB visit on 04/04/17  Discharge home Patient verbalized an understanding of the plan of care and agrees.  Raelyn Mora, MSN, CNM 03/26/2017, 4:42 AM

## 2017-03-26 NOTE — MAU Note (Signed)
Pt is refusing IV start and IVF's. Discussed with pt on why IVF's were ordered. Pt understands but does not want IV at this time.

## 2017-03-26 NOTE — MAU Note (Signed)
Pt asking if she can be discharged home. Pt is contractions every 4 mins at this time. RN discussed with pt that she is contracting and we are giving medication to help with contractions. Pt agrees to take 2nd dose of procardia.

## 2017-03-26 NOTE — Discharge Instructions (Signed)
Preterm Labor and Birth Information °Pregnancy normally lasts 39-41 weeks. Preterm labor is when labor starts early. It starts before you have been pregnant for 37 whole weeks. °What are the risk factors for preterm labor? °Preterm labor is more likely to occur in women who: °· Have an infection while pregnant. °· Have a cervix that is short. °· Have gone into preterm labor before. °· Have had surgery on their cervix. °· Are younger than age 29. °· Are older than age 35. °· Are African American. °· Are pregnant with two or more babies. °· Take street drugs while pregnant. °· Smoke while pregnant. °· Do not gain enough weight while pregnant. °· Got pregnant right after another pregnancy. ° °What are the symptoms of preterm labor? °Symptoms of preterm labor include: °· Cramps. The cramps may feel like the cramps some women get during their period. The cramps may happen with watery poop (diarrhea). °· Pain in the belly (abdomen). °· Pain in the lower back. °· Regular contractions or tightening. It may feel like your belly is getting tighter. °· Pressure in the lower belly that seems to get stronger. °· More fluid (discharge) leaking from the vagina. The fluid may be watery or bloody. °· Water breaking. ° °Why is it important to notice signs of preterm labor? °Babies who are born early may not be fully developed. They have a higher chance for: °· Long-term heart problems. °· Long-term lung problems. °· Trouble controlling body systems, like breathing. °· Bleeding in the brain. °· A condition called cerebral palsy. °· Learning difficulties. °· Death. ° °These risks are highest for babies who are born before 34 weeks of pregnancy. °How is preterm labor treated? °Treatment depends on: °· How long you were pregnant. °· Your condition. °· The health of your baby. ° °Treatment may involve: °· Having a stitch (suture) placed in your cervix. When you give birth, your cervix opens so the baby can come out. The stitch keeps the  cervix from opening too soon. °· Staying at the hospital. °· Taking or getting medicines, such as: °? Hormone medicines. °? Medicines to stop contractions. °? Medicines to help the baby’s lungs develop. °? Medicines to prevent your baby from having cerebral palsy. ° °What should I do if I am in preterm labor? °If you think you are going into labor too soon, call your doctor right away. °How can I prevent preterm labor? °· Do not use any tobacco products. °? Examples of these are cigarettes, chewing tobacco, and e-cigarettes. °? If you need help quitting, ask your doctor. °· Do not use street drugs. °· Do not use any medicines unless you ask your doctor if they are safe for you. °· Talk with your doctor before taking any herbal supplements. °· Make sure you gain enough weight. °· Watch for infection. If you think you might have an infection, get it checked right away. °· If you have gone into preterm labor before, tell your doctor. °This information is not intended to replace advice given to you by your health care provider. Make sure you discuss any questions you have with your health care provider. °Document Released: 12/14/2008 Document Revised: 02/28/2016 Document Reviewed: 02/08/2016 °Elsevier Interactive Patient Education © 2018 [employer] ° ° ° ° °Braxton Hicks Contractions °Contractions of the uterus can occur throughout pregnancy, but they are not always a sign that you are in labor. You may have practice contractions called Braxton Hicks contractions. These false labor contractions are sometimes confused with   true labor. °What are Braxton Hicks contractions? °Braxton Hicks contractions are tightening movements that occur in the muscles of the uterus before labor. Unlike true labor contractions, these contractions do not result in opening (dilation) and thinning of the cervix. Toward the end of pregnancy (32-34 weeks), Braxton Hicks contractions can happen more often and may become stronger. These  contractions are sometimes difficult to tell apart from true labor because they can be very uncomfortable. You should not feel embarrassed if you go to the hospital with false labor. °Sometimes, the only way to tell if you are in true labor is for your health care provider to look for changes in the cervix. The health care provider will do a physical exam and may monitor your contractions. If you are not in true labor, the exam should show that your cervix is not dilating and your water has not broken. °If there are no prenatal problems or other health problems associated with your pregnancy, it is completely safe for you to be sent home with false labor. You may continue to have Braxton Hicks contractions until you go into true labor. °How can I tell the difference between true labor and false labor? °· Differences °? False labor °? Contractions last 30-70 seconds.: Contractions are usually shorter and not as strong as true labor contractions. °? Contractions become very regular.: Contractions are usually irregular. °? Discomfort is usually felt in the top of the uterus, and it spreads to the lower abdomen and low back.: Contractions are often felt in the front of the lower abdomen and in the groin. °? Contractions do not go away with walking.: Contractions may go away when you walk around or change positions while lying down. °? Contractions usually become more intense and increase in frequency.: Contractions get weaker and are shorter-lasting as time goes on. °? The cervix dilates and gets thinner.: The cervix usually does not dilate or become thin. °Follow these instructions at home: °· Take over-the-counter and prescription medicines only as told by your health care provider. °· Keep up with your usual exercises and follow other instructions from your health care provider. °· Eat and drink lightly if you think you are going into labor. °· If Braxton Hicks contractions are making you uncomfortable: °? Change  your position from lying down or resting to walking, or change from walking to resting. °? Sit and rest in a tub of warm water. °? Drink enough fluid to keep your urine clear or pale yellow. Dehydration may cause these contractions. °? Do slow and deep breathing several times an hour. °· Keep all follow-up prenatal visits as told by your health care provider. This is important. °Contact a health care provider if: °· You have a fever. °· You have continuous pain in your abdomen. °Get help right away if: °· Your contractions become stronger, more regular, and closer together. °· You have fluid leaking or gushing from your vagina. °· You pass blood-tinged mucus (bloody show). °· You have bleeding from your vagina. °· You have low back pain that you never had before. °· You feel your baby’s head pushing down and causing pelvic pressure. °· Your baby is not moving inside you as much as it used to. °Summary °· Contractions that occur before labor are called Braxton Hicks contractions, false labor, or practice contractions. °· Braxton Hicks contractions are usually shorter, weaker, farther apart, and less regular than true labor contractions. True labor contractions usually become progressively stronger and regular and they become more   frequent. °· Manage discomfort from Braxton Hicks contractions by changing position, resting in a warm bath, drinking plenty of water, or practicing deep breathing. °This information is not intended to replace advice given to you by your health care provider. Make sure you discuss any questions you have with your health care provider. °Document Released: 09/17/2005 Document Revised: 08/06/2016 Document Reviewed: 08/06/2016 °Elsevier Interactive Patient Education © 2017 [employer]. ° °

## 2017-04-04 ENCOUNTER — Ambulatory Visit (INDEPENDENT_AMBULATORY_CARE_PROVIDER_SITE_OTHER): Payer: Medicaid Other | Admitting: Obstetrics & Gynecology

## 2017-04-04 ENCOUNTER — Other Ambulatory Visit (HOSPITAL_COMMUNITY)
Admission: RE | Admit: 2017-04-04 | Discharge: 2017-04-04 | Disposition: A | Discharge status: Home / Self Care | Payer: Medicaid Other | Source: Ambulatory Visit | Attending: Obstetrics & Gynecology | Admitting: Obstetrics & Gynecology

## 2017-04-04 ENCOUNTER — Encounter: Payer: Medicaid Other | Admitting: Obstetrics & Gynecology

## 2017-04-04 VITALS — BP 116/73 | HR 102 | Wt 259.4 lb

## 2017-04-04 DIAGNOSIS — O99820 Streptococcus B carrier state complicating pregnancy: Secondary | ICD-10-CM

## 2017-04-04 DIAGNOSIS — Z113 Encounter for screening for infections with a predominantly sexual mode of transmission: Secondary | ICD-10-CM

## 2017-04-04 DIAGNOSIS — F419 Anxiety disorder, unspecified: Secondary | ICD-10-CM

## 2017-04-04 DIAGNOSIS — O34219 Maternal care for unspecified type scar from previous cesarean delivery: Secondary | ICD-10-CM | POA: Diagnosis not present

## 2017-04-04 DIAGNOSIS — O0993 Supervision of high risk pregnancy, unspecified, third trimester: Secondary | ICD-10-CM | POA: Diagnosis present

## 2017-04-04 DIAGNOSIS — O0990 Supervision of high risk pregnancy, unspecified, unspecified trimester: Secondary | ICD-10-CM

## 2017-04-04 DIAGNOSIS — O24410 Gestational diabetes mellitus in pregnancy, diet controlled: Secondary | ICD-10-CM | POA: Diagnosis not present

## 2017-04-04 DIAGNOSIS — A6009 Herpesviral infection of other urogenital tract: Secondary | ICD-10-CM | POA: Diagnosis not present

## 2017-04-04 DIAGNOSIS — F329 Major depressive disorder, single episode, unspecified: Secondary | ICD-10-CM

## 2017-04-04 DIAGNOSIS — K219 Gastro-esophageal reflux disease without esophagitis: Secondary | ICD-10-CM

## 2017-04-04 DIAGNOSIS — Z98891 History of uterine scar from previous surgery: Secondary | ICD-10-CM

## 2017-04-04 LAB — POCT URINALYSIS DIP (DEVICE)
Bilirubin Urine: NEGATIVE
GLUCOSE, UA: 100 mg/dL — AB
HGB URINE DIPSTICK: NEGATIVE
KETONES UR: NEGATIVE mg/dL
Leukocytes, UA: NEGATIVE
Nitrite: NEGATIVE
PH: 6.5 (ref 5.0–8.0)
PROTEIN: 100 mg/dL — AB
Specific Gravity, Urine: 1.025 (ref 1.005–1.030)
UROBILINOGEN UA: 0.2 mg/dL (ref 0.0–1.0)

## 2017-04-04 MED ORDER — GLYBURIDE 2.5 MG PO TABS: 2.5000 mg | ORAL_TABLET | Freq: Two times a day (BID) | ORAL | 3 refills | Status: DC

## 2017-04-04 NOTE — Patient Instructions (Signed)

## 2017-04-04 NOTE — Progress Notes (Signed)
   PRENATAL VISIT NOTE  Subjective:  Melinda Jimenez is a 31 y.o. [id number] at 4479w4d being seen today for ongoing prenatal care.  She is currently monitored for the following issues for this high-risk pregnancy and has Herpes genitalis in women; Goiter, unspecified; Vitamin D deficiency; CARPAL TUNNEL SYNDROME; GERD without esophagitis; Anxiety and depression; Supervision of high risk pregnancy, antepartum; H/O: cesarean section; GBS (group B Streptococcus carrier), +RV culture, currently pregnant; and Gestational diabetes mellitus (GDM) in third trimester on her problem list.  Patient reports contractions since last night.  Contractions: Irregular. Vag. Bleeding: None.  Movement: Present. Denies leaking of fluid.   The following portions of the patient's history were reviewed and updated as appropriate: allergies, current medications, past family history, past medical history, past social history, past surgical history and problem list. Problem list updated.  Objective:   Vitals:   04/04/17 1352  BP: 116/73  Pulse: (!) 102  Weight: 259 lb 6.4 oz (117.7 kg)    Fetal Status:     Movement: Present     General:  Alert, oriented and cooperative. Patient is in no acute distress.  Skin: Skin is warm and dry. No rash noted.   Cardiovascular: Normal heart rate noted  Respiratory: Normal respiratory effort, no problems with respiration noted  Abdomen: Soft, gravid, appropriate for gestational age. Pain/Pressure: Present     Pelvic:  Cervical exam performed        Extremities: Normal range of motion.  Edema: Trace  Mental Status: Normal mood and affect. Normal behavior. Normal judgment and thought content.   Assessment and Plan:  Pregnancy: [id number] at 4679w4d  1. Supervision of high risk pregnancy, antepartum  2. Herpes genitalis in women Pt on Valtrex daily  3. H/O: cesarean section Desires repeat cesareans sectio for 'religious reasons'- pt did not want to state what those reasons were.    4. Diet controlled gestational diabetes mellitus (GDM) in third trimester Pt did not bring log fastings ~93 Glucose 120-170's (pt reports that she is rarely under 130)  Pt reports that she was taken off Glucophage to   Glyburide 2.5mg  bid  5. GERD without esophagitis  6. GBS (group B Streptococcus carrier), +RV culture, currently pregnant Atbx if she labors  7. Anxiety and depression   Preterm labor symptoms and general obstetric precautions including but not limited to vaginal bleeding, contractions, leaking of fluid and fetal movement were reviewed in detail with the patient. Please refer to After Visit Summary for other counseling recommendations.  F/u 1 week  Willodean Rosenthalarolyn Harraway-Proch, MD

## 2017-04-05 ENCOUNTER — Inpatient Hospital Stay (HOSPITAL_COMMUNITY)
Admission: AD | Admit: 2017-04-05 | Discharge: 2017-04-08 | DRG: 765 | Disposition: A | Discharge status: Home / Self Care | Payer: Medicaid Other | Source: Ambulatory Visit | Attending: Obstetrics and Gynecology | Admitting: Obstetrics and Gynecology

## 2017-04-05 ENCOUNTER — Encounter (HOSPITAL_COMMUNITY): Payer: Self-pay

## 2017-04-05 DIAGNOSIS — A6000 Herpesviral infection of urogenital system, unspecified: Secondary | ICD-10-CM | POA: Diagnosis present

## 2017-04-05 DIAGNOSIS — Z3A36 36 weeks gestation of pregnancy: Secondary | ICD-10-CM

## 2017-04-05 DIAGNOSIS — O24425 Gestational diabetes mellitus in childbirth, controlled by oral hypoglycemic drugs: Secondary | ICD-10-CM | POA: Diagnosis present

## 2017-04-05 DIAGNOSIS — O99824 Streptococcus B carrier state complicating childbirth: Secondary | ICD-10-CM | POA: Diagnosis present

## 2017-04-05 DIAGNOSIS — O42013 Preterm premature rupture of membranes, onset of labor within 24 hours of rupture, third trimester: Principal | ICD-10-CM | POA: Diagnosis present

## 2017-04-05 DIAGNOSIS — O9832 Other infections with a predominantly sexual mode of transmission complicating childbirth: Secondary | ICD-10-CM | POA: Diagnosis present

## 2017-04-05 DIAGNOSIS — O0990 Supervision of high risk pregnancy, unspecified, unspecified trimester: Secondary | ICD-10-CM

## 2017-04-05 DIAGNOSIS — Z87891 Personal history of nicotine dependence: Secondary | ICD-10-CM

## 2017-04-05 DIAGNOSIS — O34211 Maternal care for low transverse scar from previous cesarean delivery: Secondary | ICD-10-CM | POA: Diagnosis present

## 2017-04-05 LAB — GC/CHLAMYDIA PROBE AMP (~~LOC~~) NOT AT ARMC
CHLAMYDIA, DNA PROBE: NEGATIVE
NEISSERIA GONORRHEA: NEGATIVE

## 2017-04-05 LAB — POCT FERN TEST: POCT Fern Test: POSITIVE

## 2017-04-05 NOTE — MAU Note (Signed)
Leaking clear fld for about . Ctxs started after water broke.

## 2017-04-05 NOTE — H&P (Signed)
Melinda Jimenez is a 31 y.o. female [id number] IUP 36 5/7 weeks presenting for SROM @ 10 PM. No vaginal bleeding. Ut ctx started shortly after SROM. Prenatal care complicated by GDM A2 not well controlled on Glyburide 2.5 mg bid. U/S 03/26/17 EFW 7# 2 oz Prior c section d/t active HSV. Currently on suppressive therapy and no active lesions presently.  OB History    Gravida Para Term Preterm AB Living   9 3 3   5 2    SAB TAB Ectopic Multiple Live Births   2 3     2      Past Medical History:  Diagnosis Date  . Anemia   . Anxiety    off meds with preg  . Carpal tunnel syndrome   . Chlamydia   . Genital HSV    Last OB: > 7755yr ago; Valtrex 500 mg po prn outbreaks  . GERD (gastroesophageal reflux disease)   . Gestational diabetes    diet controlled  . Headache(784.0)   . IBS (irritable bowel syndrome)   . Infection    UTI  . Mood swings (HCC)   . Panic attack   . Thyroid disease   . Trichomonas    Past Surgical History:  Procedure Laterality Date  . CESAREAN SECTION N/A 05/20/2013   Procedure: CESAREAN SECTION;  Surgeon: [name], MD;  Location: [facility];  Service: Obstetrics;  Laterality: N/A;  . INDUCED ABORTION  2009  . VAGINAL DELIVERY  2007   Family History: family history includes Anxiety disorder in her mother; Bipolar disorder in her brother; Breast cancer in her other; Cancer in her maternal grandmother; Depression in her mother; Diabetes in her maternal grandmother; Hypertension in her maternal grandmother and paternal grandmother. Social History:  reports that she has quit smoking. She has never used smokeless tobacco. She reports that she does not drink alcohol or use drugs.     Maternal Diabetes: Yes:  Diabetes Type:  Insulin/Medication controlled Genetic Screening: Normal Maternal Ultrasounds/Referrals: Normal Fetal Ultrasounds or other Referrals:  None Maternal Substance Abuse:  No Significant Maternal Medications:  Meds include: Other:  Significant Maternal  Lab Results:  None Other Comments:  None  Review of Systems  Constitutional: Negative.   Respiratory: Negative.   Cardiovascular: Negative.   Gastrointestinal: Negative.   Genitourinary: Negative.    History Dilation: 4 Effacement (%): 70 Station: -3 Exam by:: benji Forensic scientiststanley RN Blood pressure 119/76, pulse (!) 115, temperature 98.3 F (36.8 C), resp. rate 20, height 5\' 8"  (1.727 m), weight 117.5 kg (259 lb), last menstrual period 07/22/2016. Exam Physical Exam  Constitutional: She appears well-developed and well-nourished.  Cardiovascular: Normal rate, regular rhythm and normal heart sounds.   Respiratory: Effort normal and breath sounds normal.  GI: Soft. Bowel sounds are normal.  gravid  Genitourinary:  Genitourinary Comments: Grossly rupture 4/60%/-2  Vertex No active lesions noted    Prenatal labs: ABO, Rh: --/--/A POS (06/23 2050) Antibody: NEG (06/23 2050) Rubella: 2.29 (12/28 1342) RPR: Non Reactive (05/07 1701)  HBsAg: Negative (12/28 1342)  HIV: Non Reactive (05/07 1701)  GBS:  Positive   Assessment/Plan: IUP 36 5/7 weeks SROM Prior c section GDM GBS +  H/O HSV  Pt previously had signed for TOLAC but desires repeat c section. TOLAC vs RLTCS reviewed with pt. R/B/Post op care of each discussed. After discussion pt still desires RLTCS. Pt unfortunately ate a heavy meal @ 10 PM. Discussed with anesthesia and will need to wait. c section scheduled  for @ 4:30 AM. Admit with routine orders, antibiotics for GBS.    Hermina Staggers 04/05/2017, 11:51 PM

## 2017-04-06 ENCOUNTER — Encounter (HOSPITAL_COMMUNITY)
Admission: AD | Disposition: A | Discharge status: Home / Self Care | Payer: Self-pay | Source: Ambulatory Visit | Attending: Obstetrics and Gynecology

## 2017-04-06 ENCOUNTER — Inpatient Hospital Stay (HOSPITAL_COMMUNITY): Payer: Medicaid Other | Admitting: Anesthesiology

## 2017-04-06 ENCOUNTER — Encounter (HOSPITAL_COMMUNITY): Payer: Self-pay | Admitting: Neonatal-Perinatal Medicine

## 2017-04-06 DIAGNOSIS — Z3A36 36 weeks gestation of pregnancy: Secondary | ICD-10-CM | POA: Diagnosis not present

## 2017-04-06 DIAGNOSIS — O9832 Other infections with a predominantly sexual mode of transmission complicating childbirth: Secondary | ICD-10-CM | POA: Diagnosis present

## 2017-04-06 DIAGNOSIS — Z87891 Personal history of nicotine dependence: Secondary | ICD-10-CM | POA: Diagnosis not present

## 2017-04-06 DIAGNOSIS — O24425 Gestational diabetes mellitus in childbirth, controlled by oral hypoglycemic drugs: Secondary | ICD-10-CM | POA: Diagnosis present

## 2017-04-06 DIAGNOSIS — O42013 Preterm premature rupture of membranes, onset of labor within 24 hours of rupture, third trimester: Secondary | ICD-10-CM | POA: Diagnosis present

## 2017-04-06 DIAGNOSIS — O99824 Streptococcus B carrier state complicating childbirth: Secondary | ICD-10-CM | POA: Diagnosis not present

## 2017-04-06 DIAGNOSIS — A6000 Herpesviral infection of urogenital system, unspecified: Secondary | ICD-10-CM | POA: Diagnosis present

## 2017-04-06 DIAGNOSIS — O34211 Maternal care for low transverse scar from previous cesarean delivery: Secondary | ICD-10-CM | POA: Diagnosis present

## 2017-04-06 LAB — CBC
HEMATOCRIT: 33.6 % — AB (ref 36.0–46.0)
HEMOGLOBIN: 10.6 g/dL — AB (ref 12.0–15.0)
MCH: 22.6 pg — ABNORMAL LOW (ref 26.0–34.0)
MCHC: 31.5 g/dL (ref 30.0–36.0)
MCV: 71.5 fL — AB (ref 78.0–100.0)
Platelets: 291 10*3/uL (ref 150–400)
RBC: 4.7 MIL/uL (ref 3.87–5.11)
RDW: 17 % — AB (ref 11.5–15.5)
WBC: 10.2 10*3/uL (ref 4.0–10.5)

## 2017-04-06 LAB — GLUCOSE, CAPILLARY
Glucose-Capillary: 146 mg/dL — ABNORMAL HIGH (ref 65–99)
Glucose-Capillary: 125 mg/dL — ABNORMAL HIGH (ref 65–99)

## 2017-04-06 LAB — RPR: RPR Ser Ql: NONREACTIVE

## 2017-04-06 LAB — TYPE AND SCREEN
ABO/RH(D): A POS
ANTIBODY SCREEN: NEGATIVE

## 2017-04-06 SURGERY — Surgical Case
Anesthesia: Spinal

## 2017-04-06 MED ORDER — FAMOTIDINE IN NACL 20-0.9 MG/50ML-% IV SOLN: 20.0000 mg | Freq: Once | INTRAVENOUS | Status: DC

## 2017-04-06 MED ORDER — PHENYLEPHRINE HCL 10 MG/ML IJ SOLN
INTRAMUSCULAR | Status: DC | PRN
  Administered 2017-04-06 (×2): 120 ug via INTRAVENOUS

## 2017-04-06 MED ORDER — SIMETHICONE 80 MG PO CHEW
80.0000 mg | CHEWABLE_TABLET | Freq: Three times a day (TID) | ORAL | Status: DC
  Administered 2017-04-06 – 2017-04-08 (×5): 80 mg via ORAL
  Filled 2017-04-06 (×12): qty 1

## 2017-04-06 MED ORDER — BUTORPHANOL TARTRATE 1 MG/ML IJ SOLN
1.0000 mg | Freq: Once | INTRAMUSCULAR | Status: AC
  Administered 2017-04-06: 1 mg via INTRAVENOUS
  Filled 2017-04-06: qty 1

## 2017-04-06 MED ORDER — KETOROLAC TROMETHAMINE 30 MG/ML IJ SOLN
INTRAMUSCULAR | Status: DC | PRN
  Administered 2017-04-06: 30 mg via INTRAVENOUS

## 2017-04-06 MED ORDER — KETOROLAC TROMETHAMINE 30 MG/ML IJ SOLN
30.0000 mg | Freq: Once | INTRAMUSCULAR | Status: AC
  Administered 2017-04-06: 30 mg via INTRAVENOUS

## 2017-04-06 MED ORDER — MORPHINE SULFATE (PF) 0.5 MG/ML IJ SOLN
INTRAMUSCULAR | Status: DC | PRN
  Administered 2017-04-06: .2 mg via INTRATHECAL

## 2017-04-06 MED ORDER — PRENATAL MULTIVITAMIN CH
1.0000 | ORAL_TABLET | Freq: Every day | ORAL | Status: DC
  Administered 2017-04-07 – 2017-04-08 (×2): 1 via ORAL
  Filled 2017-04-06 (×4): qty 1

## 2017-04-06 MED ORDER — OXYCODONE HCL 5 MG PO TABS
5.0000 mg | ORAL_TABLET | Freq: Once | ORAL | Status: DC | PRN
Start: 2017-04-06 — End: 2017-04-06

## 2017-04-06 MED ORDER — DIPHENHYDRAMINE HCL 25 MG PO CAPS
25.0000 mg | ORAL_CAPSULE | Freq: Four times a day (QID) | ORAL | Status: DC | PRN
  Administered 2017-04-06: 25 mg via ORAL
  Filled 2017-04-06 (×2): qty 1

## 2017-04-06 MED ORDER — PENICILLIN G POT IN DEXTROSE 60000 UNIT/ML IV SOLN
3.0000 10*6.[IU] | INTRAVENOUS | Status: DC
  Filled 2017-04-06 (×3): qty 50

## 2017-04-06 MED ORDER — SOD CITRATE-CITRIC ACID 500-334 MG/5ML PO SOLN
30.0000 mL | ORAL | Status: AC
  Administered 2017-04-06: 30 mL via ORAL
  Filled 2017-04-06: qty 15

## 2017-04-06 MED ORDER — PROMETHAZINE HCL 25 MG/ML IJ SOLN: 6.2500 mg | INTRAMUSCULAR | Status: DC | PRN

## 2017-04-06 MED ORDER — OXYCODONE-ACETAMINOPHEN 5-325 MG PO TABS
1.0000 | ORAL_TABLET | ORAL | Status: DC | PRN
  Administered 2017-04-07 – 2017-04-08 (×6): 1 via ORAL
  Filled 2017-04-06 (×6): qty 1

## 2017-04-06 MED ORDER — FAMOTIDINE IN NACL 20-0.9 MG/50ML-% IV SOLN
20.0000 mg | INTRAVENOUS | Status: AC
  Administered 2017-04-06: 20 mg via INTRAVENOUS
  Filled 2017-04-06: qty 50

## 2017-04-06 MED ORDER — SIMETHICONE 80 MG PO CHEW
80.0000 mg | CHEWABLE_TABLET | ORAL | Status: DC | PRN
  Filled 2017-04-06: qty 1

## 2017-04-06 MED ORDER — COCONUT OIL OIL
1.0000 "application " | TOPICAL_OIL | Status: DC | PRN
  Filled 2017-04-06: qty 120

## 2017-04-06 MED ORDER — MEPERIDINE HCL 25 MG/ML IJ SOLN
INTRAMUSCULAR | Status: DC | PRN
  Administered 2017-04-06 (×2): 12.5 mg via INTRAVENOUS

## 2017-04-06 MED ORDER — PENICILLIN G POT IN DEXTROSE 60000 UNIT/ML IV SOLN: 3.0000 10*6.[IU] | INTRAVENOUS | Status: DC

## 2017-04-06 MED ORDER — LACTATED RINGERS IV SOLN
INTRAVENOUS | Status: DC
  Administered 2017-04-06 (×3): via INTRAVENOUS

## 2017-04-06 MED ORDER — ZOLPIDEM TARTRATE 5 MG PO TABS
5.0000 mg | ORAL_TABLET | Freq: Every evening | ORAL | Status: DC | PRN
  Filled 2017-04-06: qty 1

## 2017-04-06 MED ORDER — IBUPROFEN 800 MG PO TABS: 800.0000 mg | ORAL_TABLET | Freq: Three times a day (TID) | ORAL | Status: DC

## 2017-04-06 MED ORDER — FENTANYL CITRATE (PF) 100 MCG/2ML IJ SOLN
INTRAMUSCULAR | Status: DC | PRN
  Administered 2017-04-06: 40 ug via INTRAVENOUS
  Administered 2017-04-06: 10 ug via INTRATHECAL
  Administered 2017-04-06: 50 ug via INTRAVENOUS

## 2017-04-06 MED ORDER — KETOROLAC TROMETHAMINE 30 MG/ML IJ SOLN: 30.0000 mg | Freq: Four times a day (QID) | INTRAMUSCULAR | Status: AC

## 2017-04-06 MED ORDER — OXYTOCIN 40 UNITS IN LACTATED RINGERS INFUSION - SIMPLE MED: 2.5000 [IU]/h | INTRAVENOUS | Status: AC

## 2017-04-06 MED ORDER — PENICILLIN G POTASSIUM 5000000 UNITS IJ SOLR
5.0000 10*6.[IU] | Freq: Once | INTRAVENOUS | Status: AC
  Administered 2017-04-06: 5 10*6.[IU] via INTRAVENOUS
  Filled 2017-04-06: qty 5

## 2017-04-06 MED ORDER — TETANUS-DIPHTH-ACELL PERTUSSIS 5-2.5-18.5 LF-MCG/0.5 IM SUSP
0.5000 mL | Freq: Once | INTRAMUSCULAR | Status: DC
  Filled 2017-04-06: qty 0.5

## 2017-04-06 MED ORDER — ACETAMINOPHEN 325 MG PO TABS: 650.0000 mg | ORAL_TABLET | ORAL | Status: DC | PRN

## 2017-04-06 MED ORDER — SCOPOLAMINE 1 MG/3DAYS TD PT72
MEDICATED_PATCH | TRANSDERMAL | Status: DC | PRN
  Administered 2017-04-06: 1 via TRANSDERMAL

## 2017-04-06 MED ORDER — KETOROLAC TROMETHAMINE 30 MG/ML IJ SOLN
INTRAMUSCULAR | Status: AC
  Administered 2017-04-06: 30 mg via INTRAVENOUS
  Filled 2017-04-06: qty 1

## 2017-04-06 MED ORDER — OXYTOCIN 10 UNIT/ML IJ SOLN
INTRAVENOUS | Status: DC | PRN
  Administered 2017-04-06: 40 [IU] via INTRAVENOUS

## 2017-04-06 MED ORDER — SODIUM CHLORIDE 0.9 % IR SOLN
Status: DC | PRN
  Administered 2017-04-06: 300 mL

## 2017-04-06 MED ORDER — SIMETHICONE 80 MG PO CHEW
80.0000 mg | CHEWABLE_TABLET | ORAL | Status: DC
  Administered 2017-04-07 – 2017-04-08 (×2): 80 mg via ORAL
  Filled 2017-04-06 (×4): qty 1

## 2017-04-06 MED ORDER — WITCH HAZEL-GLYCERIN EX PADS: 1.0000 "application " | MEDICATED_PAD | CUTANEOUS | Status: DC | PRN

## 2017-04-06 MED ORDER — CEFAZOLIN SODIUM-DEXTROSE 2-4 GM/100ML-% IV SOLN
2.0000 g | INTRAVENOUS | Status: AC
  Administered 2017-04-06: 2 g via INTRAVENOUS

## 2017-04-06 MED ORDER — MENTHOL 3 MG MT LOZG
1.0000 | LOZENGE | OROMUCOSAL | Status: DC | PRN
  Filled 2017-04-06: qty 9

## 2017-04-06 MED ORDER — SENNOSIDES-DOCUSATE SODIUM 8.6-50 MG PO TABS
2.0000 | ORAL_TABLET | ORAL | Status: DC
  Administered 2017-04-07 – 2017-04-08 (×2): 2 via ORAL
  Filled 2017-04-06 (×4): qty 2

## 2017-04-06 MED ORDER — BUPIVACAINE IN DEXTROSE 0.75-8.25 % IT SOLN
INTRATHECAL | Status: DC | PRN
  Administered 2017-04-06: 1.6 mL via INTRATHECAL

## 2017-04-06 MED ORDER — IBUPROFEN 800 MG PO TABS
800.0000 mg | ORAL_TABLET | Freq: Three times a day (TID) | ORAL | Status: DC
  Administered 2017-04-06 – 2017-04-08 (×6): 800 mg via ORAL
  Filled 2017-04-06 (×6): qty 1

## 2017-04-06 MED ORDER — NALBUPHINE HCL 10 MG/ML IJ SOLN
5.0000 mg | INTRAMUSCULAR | Status: DC | PRN
  Filled 2017-04-06: qty 0.5

## 2017-04-06 MED ORDER — OXYCODONE HCL 5 MG/5ML PO SOLN: 5.0000 mg | Freq: Once | ORAL | Status: DC | PRN

## 2017-04-06 MED ORDER — LACTATED RINGERS IV SOLN
INTRAVENOUS | Status: DC
  Administered 2017-04-06: 12:00:00 via INTRAVENOUS

## 2017-04-06 MED ORDER — OXYCODONE-ACETAMINOPHEN 5-325 MG PO TABS
2.0000 | ORAL_TABLET | ORAL | Status: DC | PRN
  Administered 2017-04-06: 1 via ORAL
  Filled 2017-04-06 (×2): qty 2

## 2017-04-06 MED ORDER — SOD CITRATE-CITRIC ACID 500-334 MG/5ML PO SOLN: 30.0000 mL | Freq: Once | ORAL | Status: DC

## 2017-04-06 MED ORDER — HYDROMORPHONE HCL 1 MG/ML IJ SOLN: 0.2500 mg | INTRAMUSCULAR | Status: DC | PRN

## 2017-04-06 MED ORDER — DIBUCAINE 1 % RE OINT
1.0000 "application " | TOPICAL_OINTMENT | RECTAL | Status: DC | PRN
  Filled 2017-04-06: qty 28

## 2017-04-06 MED ORDER — PHENYLEPHRINE 8 MG IN D5W 100 ML (0.08MG/ML) PREMIX OPTIME
INJECTION | INTRAVENOUS | Status: DC | PRN
  Administered 2017-04-06: 60 ug/min via INTRAVENOUS

## 2017-04-06 SURGICAL SUPPLY — 46 items
APL SKNCLS STERI-STRIP NONHPOA (GAUZE/BANDAGES/DRESSINGS) ×1
BENZOIN TINCTURE PRP APPL 2/3 (GAUZE/BANDAGES/DRESSINGS) ×3 IMPLANT
CHLORAPREP W/TINT 26ML (MISCELLANEOUS) ×3 IMPLANT
CLAMP CORD UMBIL (MISCELLANEOUS) IMPLANT
CLOSURE STERI-STRIP 1/4X4 (GAUZE/BANDAGES/DRESSINGS) ×2 IMPLANT
CLOSURE WOUND 1/2 X4 (GAUZE/BANDAGES/DRESSINGS) ×1
CLOTH BEACON ORANGE TIMEOUT ST (SAFETY) ×3 IMPLANT
DRAPE C SECTION CLR SCREEN (DRAPES) ×3 IMPLANT
DRSG OPSITE POSTOP 4X10 (GAUZE/BANDAGES/DRESSINGS) ×3 IMPLANT
ELECT REM PT RETURN 9FT ADLT (ELECTROSURGICAL) ×3
ELECTRODE REM PT RTRN 9FT ADLT (ELECTROSURGICAL) ×1 IMPLANT
EXTRACTOR VACUUM M CUP 4 TUBE (SUCTIONS) IMPLANT
EXTRACTOR VACUUM M CUP 4' TUBE (SUCTIONS)
GLOVE BIO SURGEON STRL SZ7.5 (GLOVE) ×3 IMPLANT
GLOVE BIOGEL PI IND STRL 7.0 (GLOVE) ×1 IMPLANT
GLOVE BIOGEL PI INDICATOR 7.0 (GLOVE) ×2
GOWN STRL REUS W/TWL 2XL LVL3 (GOWN DISPOSABLE) ×3 IMPLANT
GOWN STRL REUS W/TWL LRG LVL3 (GOWN DISPOSABLE) ×6 IMPLANT
HOVERMATT SINGLE USE (MISCELLANEOUS) ×2 IMPLANT
KIT ABG SYR 3ML LUER SLIP (SYRINGE) IMPLANT
NDL HYPO 25X5/8 SAFETYGLIDE (NEEDLE) IMPLANT
NEEDLE HYPO 22GX1.5 SAFETY (NEEDLE) ×3 IMPLANT
NEEDLE HYPO 25X5/8 SAFETYGLIDE (NEEDLE) IMPLANT
NS IRRIG 1000ML POUR BTL (IV SOLUTION) ×3 IMPLANT
PACK C SECTION WH (CUSTOM PROCEDURE TRAY) ×3 IMPLANT
PAD OB MATERNITY 4.3X12.25 (PERSONAL CARE ITEMS) ×3 IMPLANT
PENCIL SMOKE EVAC W/HOLSTER (ELECTROSURGICAL) ×3 IMPLANT
RETRACTOR TRAXI PANNICULUS (MISCELLANEOUS) IMPLANT
RTRCTR C-SECT PINK 25CM LRG (MISCELLANEOUS) ×3 IMPLANT
STRIP CLOSURE SKIN 1/2X4 (GAUZE/BANDAGES/DRESSINGS) ×2 IMPLANT
SUT CHROMIC 1 CTX 36 (SUTURE) ×6 IMPLANT
SUT VIC AB 1 CT1 27 (SUTURE) ×6
SUT VIC AB 1 CT1 27XBRD ANTBC (SUTURE) ×2 IMPLANT
SUT VIC AB 2-0 CT1 (SUTURE) ×3 IMPLANT
SUT VIC AB 2-0 CT1 27 (SUTURE) ×3
SUT VIC AB 2-0 CT1 TAPERPNT 27 (SUTURE) ×1 IMPLANT
SUT VIC AB 3-0 CT1 27 (SUTURE) ×6
SUT VIC AB 3-0 CT1 TAPERPNT 27 (SUTURE) ×2 IMPLANT
SUT VIC AB 3-0 SH 27 (SUTURE)
SUT VIC AB 3-0 SH 27X BRD (SUTURE) IMPLANT
SUT VIC AB 4-0 KS 27 (SUTURE) ×3 IMPLANT
SYR BULB IRRIGATION 50ML (SYRINGE) ×3 IMPLANT
SYR CONTROL 10ML LL (SYRINGE) ×3 IMPLANT
TOWEL OR 17X24 6PK STRL BLUE (TOWEL DISPOSABLE) ×3 IMPLANT
TRAXI PANNICULUS RETRACTOR (MISCELLANEOUS) ×2
TRAY FOLEY BAG SILVER LF 14FR (SET/KITS/TRAYS/PACK) ×3 IMPLANT

## 2017-04-06 NOTE — Anesthesia Postprocedure Evaluation (Signed)
Anesthesia Post Note  Patient: Melinda Jimenez  Procedure(s) Performed: Procedure(s) (LRB): CESAREAN SECTION (N/A)     Anesthesia Type: Spinal    Last Vitals:  Vitals:   04/06/17 1020 04/06/17 1100  BP: 107/67   Pulse: 86   Resp: 20   Temp: (!) 35.9 C (!) 36.3 C    Last Pain:  Vitals:   04/06/17 1145  TempSrc:   PainSc: 0-No pain   Pain Goal: Patients Stated Pain Goal: 0 (04/06/17 0401)               Cephus ShellingBURGER,Niala Stcharles

## 2017-04-06 NOTE — Anesthesia Procedure Notes (Signed)
Spinal  Patient location during procedure: OB Start time: 04/06/2017 4:50 AM End time: 04/06/2017 4:55 AM Staffing Anesthesiologist: Anitra LauthMILLER, Emran Molzahn RAY Performed: anesthesiologist  Preanesthetic Checklist Completed: patient identified, surgical consent, pre-op evaluation, timeout performed, IV checked, risks and benefits discussed and monitors and equipment checked Spinal Block Patient position: sitting Prep: Betadine and site prepped and draped Patient monitoring: heart rate, cardiac monitor, continuous pulse ox and blood pressure Approach: midline Location: L3-4 Injection technique: single-shot Needle Needle type: Pencan  Needle gauge: 24 G Needle length: 10 cm Assessment Sensory level: T4

## 2017-04-06 NOTE — Transfer of Care (Signed)
Immediate Anesthesia Transfer of Care Note  Patient: Melinda Jimenez  Procedure(s) Performed: Procedure(s): CESAREAN SECTION (N/A)  Patient Location: PACU  Anesthesia Type:Spinal  Level of Consciousness: awake, alert  and oriented  Airway & Oxygen Therapy: Patient Spontanous Breathing  Post-op Assessment: Report given to RN and Post -op Vital signs reviewed and stable  Post vital signs: Reviewed and stable  Last Vitals:  Vitals:   04/06/17 0151 04/06/17 0308  BP: 118/68 (!) 109/55  Pulse: (!) 102 (!) 105  Resp: 16 16  Temp: 36.7 C 36.7 C    Last Pain:  Vitals:   04/06/17 0401  TempSrc:   PainSc: 10-Worst pain ever      Patients Stated Pain Goal: 0 (04/06/17 0401)  Complications: No apparent anesthesia complications

## 2017-04-06 NOTE — Anesthesia Preprocedure Evaluation (Signed)
Anesthesia Evaluation   Patient awake    Reviewed: Allergy & Precautions, H&P , NPO status , Patient's Chart, lab work & pertinent test results  Airway Mallampati: II  TM Distance: >3 FB Neck ROM: full    Dental no notable dental hx.    Pulmonary neg pulmonary ROS, former smoker,    Pulmonary exam normal        Cardiovascular negative cardio ROS Normal cardiovascular exam Rhythm:regular Rate:Normal     Neuro/Psych    GI/Hepatic Neg liver ROS,   Endo/Other  diabetes  Renal/GU negative Renal ROS     Musculoskeletal   Abdominal Normal abdominal exam  (+)   Peds  Hematology negative hematology ROS (+)   Anesthesia Other Findings   Reproductive/Obstetrics (+) Pregnancy                             Anesthesia Physical  Anesthesia Plan  ASA: II  Anesthesia Plan: Spinal   Post-op Pain Management:    Induction:   PONV Risk Score and Plan:   Airway Management Planned:   Additional Equipment:   Intra-op Plan:   Post-operative Plan:   Informed Consent:   Plan Discussed with: CRNA and Surgeon  Anesthesia Plan Comments:         Anesthesia Quick Evaluation

## 2017-04-06 NOTE — Op Note (Signed)
Cesarean Section Procedure Note  04/05/2017 - 04/06/2017  7:48 AM  PATIENT:  Melinda Jimenez  31 y.o. female  PRE-OPERATIVE DIAGNOSIS:  CESAREAN SECTION  POST-OPERATIVE DIAGNOSIS:  Repeat CESAREAN SECTION spontaneous rupture of membrances  PROCEDURE:  Procedure(s): CESAREAN SECTION (N/A)  SURGEON:  Surgeon(s) and Role:    * Hermina StaggersErvin, Gerardo Territo L, MD - Primary  ASSISTANTS: none   ANESTHESIA:   spinal  EBL: 1000 cc  BLOOD ADMINISTERED:none  DRAINS: none   LOCAL MEDICATIONS USED:  NONE  SPECIMEN:  Source of Specimen:  Placenta  DISPOSITION OF SPECIMEN:  PATHOLOGY   Procedure Details   The patient was seen in the Holding Room. The risks, benefits, complications, treatment options, and expected outcomes were discussed with the patient.  The patient concurred with the proposed plan, giving informed consent.  The site of surgery properly noted/marked. The patient was taken to Operating Room # 9, identified as Melinda Jimenez and the procedure verified as C-Section Delivery. A Time Out was held and the above information confirmed.  After induction of anesthesia, the patient was draped and prepped in the usual sterile manner. A Pfannenstiel incision was made and carried down through the subcutaneous tissue to the fascia. Fascial incision was made and extended transversely. The fascia was separated from the underlying rectus tissue superiorly and inferiorly. The peritoneum was identified and entered. Peritoneal incision was extended longitudinally. The utero-vesical peritoneal reflection was incised transversely and the bladder flap was bluntly freed from the lower uterine segment. A low transverse uterine incision was made. Delivered from cephalic presentation was a Female with Apgar scores of pending at one minute and pending at five minutes. After the umbilical cord was clamped and cut cord blood was obtained for evaluation. The placenta was removed intact and appeared normal. The uterine outline,  tubes and ovaries appeared normal. The uterine incision was closed with running locked sutures of Chromic.A second layer was completed with same suture.  Hemostasis was observed. Lavage was carried out until clear.Abd muscles and peritoneum was reapproximated with 2/0 Vicryl. The fascia was then reapproximated with running sutures of Vicryl from conner to conner. Interrupted 3/0 vircyl was placed in the subcutaneous tissue.  The skin was reapproximated with Vicryl.  Instrument, sponge, and needle counts were correct prior the abdominal closure and at the conclusion of the case.   Complications:  None; patient tolerated the procedure well.  COUNTS:  YES  PLAN OF CARE: Admit to inpatient   PATIENT DISPOSITION:  PACU - hemodynamically stable.   Delay start of Pharmacological VTE agent (>24hrs) due to surgical blood loss or risk of bleeding: not applicable             Disposition: PACU - hemodynamically stable.         Condition: stable   Hermina StaggersErvin, Leticia Mcdiarmid L, MD 04/06/2017 7:48 AM

## 2017-04-07 LAB — CBC
HCT: 24.9 % — ABNORMAL LOW (ref 36.0–46.0)
Hemoglobin: 8.1 g/dL — ABNORMAL LOW (ref 12.0–15.0)
MCH: 23.1 pg — ABNORMAL LOW (ref 26.0–34.0)
MCHC: 32.5 g/dL (ref 30.0–36.0)
MCV: 71.1 fL — ABNORMAL LOW (ref 78.0–100.0)
PLATELETS: 228 10*3/uL (ref 150–400)
RBC: 3.5 MIL/uL — ABNORMAL LOW (ref 3.87–5.11)
RDW: 17.2 % — ABNORMAL HIGH (ref 11.5–15.5)
WBC: 9.3 10*3/uL (ref 4.0–10.5)

## 2017-04-07 MED ORDER — NITROFURANTOIN MONOHYD MACRO 100 MG PO CAPS
100.0000 mg | ORAL_CAPSULE | Freq: Two times a day (BID) | ORAL | Status: DC
  Administered 2017-04-07 – 2017-04-08 (×3): 100 mg via ORAL
  Filled 2017-04-07 (×5): qty 1

## 2017-04-07 NOTE — Anesthesia Postprocedure Evaluation (Signed)
Anesthesia Post Note  Patient: Melinda Jimenez  Procedure(s) Performed: Procedure(s) (LRB): CESAREAN SECTION (N/A)     Patient location during evaluation: PACU Anesthesia Type: Spinal Level of consciousness: oriented and awake and alert Pain management: pain level controlled Vital Signs Assessment: post-procedure vital signs reviewed and stable Respiratory status: spontaneous breathing and respiratory function stable Cardiovascular status: blood pressure returned to baseline and stable Postop Assessment: no headache and no backache Anesthetic complications: no    Last Vitals:  Vitals:   04/07/17 0000 04/07/17 0500  BP: (!) 100/50 (!) 100/56  Pulse: 90 88  Resp: 18 18  Temp: 36.8 C 36.7 C    Last Pain:  Vitals:   04/07/17 0500  TempSrc: Oral  PainSc: 0-No pain   Pain Goal: Patients Stated Pain Goal: 0 (04/06/17 0401)               Lowella CurbWarren Ray Miller

## 2017-04-07 NOTE — Progress Notes (Signed)
Subjective: Postpartum Day 1: Cesarean Delivery Patient reports incisional pain, tolerating PO, + flatus and no problems voiding.    Objective: Vital signs in last 24 hours: Temp:  [96.6 F (35.9 C)-98.3 F (36.8 C)] 98.1 F (36.7 C) (07/08 0500) Pulse Rate:  [86-98] 88 (07/08 0500) Resp:  [18-20] 18 (07/08 0500) BP: (100-108)/(50-71) 100/56 (07/08 0500) SpO2:  [98 %-100 %] 98 % (07/08 0500)  Physical Exam:  General: alert, cooperative, appears stated age and no distress Lochia: appropriate Uterine Fundus: firm Incision: healing well, no significant drainage, no dehiscence, no significant erythema DVT Evaluation: No evidence of DVT seen on physical exam.   Recent Labs  04/05/17 2355 04/07/17 0514  HGB 10.6* 8.1*  HCT 33.6* 24.9*    Assessment/Plan: Status post Cesarean section. Doing well postoperatively.  Continue current care.  Wyvonnia DuskyMarie Lawson 04/07/2017, 9:49 AM

## 2017-04-08 LAB — GLUCOSE, CAPILLARY: GLUCOSE-CAPILLARY: 148 mg/dL — AB (ref 65–99)

## 2017-04-08 MED ORDER — OXYCODONE-ACETAMINOPHEN 5-325 MG PO TABS: 1.0000 | ORAL_TABLET | Freq: Four times a day (QID) | ORAL | 0 refills | Status: DC | PRN

## 2017-04-08 MED ORDER — DOCUSATE SODIUM 100 MG PO CAPS: 100.0000 mg | ORAL_CAPSULE | Freq: Two times a day (BID) | ORAL | 2 refills | Status: DC | PRN

## 2017-04-08 MED ORDER — SOD CITRATE-CITRIC ACID 500-334 MG/5ML PO SOLN
30.0000 mL | ORAL | Status: DC
  Filled 2017-04-08: qty 15

## 2017-04-08 MED ORDER — FERROUS SULFATE 325 (65 FE) MG PO TABS
325.0000 mg | ORAL_TABLET | Freq: Two times a day (BID) | ORAL | 1 refills | Status: DC
Start: 2017-04-08 — End: 2017-11-04

## 2017-04-08 MED ORDER — IBUPROFEN 800 MG PO TABS: 800.0000 mg | ORAL_TABLET | Freq: Three times a day (TID) | ORAL | 2 refills | Status: DC | PRN

## 2017-04-08 MED ORDER — LACTATED RINGERS IV SOLN: INTRAVENOUS | Status: DC

## 2017-04-08 MED ORDER — NITROFURANTOIN MONOHYD MACRO 100 MG PO CAPS: 100.0000 mg | ORAL_CAPSULE | Freq: Two times a day (BID) | ORAL | 0 refills | Status: AC

## 2017-04-08 NOTE — Discharge Instructions (Signed)

## 2017-04-08 NOTE — Progress Notes (Signed)
MOB was referred for history of depression/anxiety. * Referral screened out by Clinical Social Worker because none of the following criteria appear to apply: ~ History of anxiety/depression during this pregnancy, or of post-partum depression. ~ Diagnosis of anxiety and/or depression within last 3 years OR * MOB's symptoms currently being treated with medication and/or therapy. Please contact the Clinical Social Worker if needs arise, or if MOB requests.   

## 2017-04-08 NOTE — Discharge Summary (Signed)
OB Discharge Summary     Patient Name: Melinda Jimenez DOB: 24-Nov-1985 MRN: [id number]  Date of admission: 04/05/2017 Delivering MD: Hermina StaggersERVIN, MICHAEL L   Date of discharge: 04/08/2017  Admitting diagnosis: 37 WKS WATER BROKE PRESSURE Intrauterine pregnancy: [id number]     Secondary diagnosis:  Active Problems:   Postpartum care following cesarean delivery  Additional problems: None     Discharge diagnosis: Term Pregnancy Delivered                                                                                                Post partum procedures:None  Complications: None  Hospital course:  Onset of Labor With Unplanned C/S  31 y.o. yo [id number] at 3653w6d was admitted in Latent Labor on 04/05/2017. Patient had a labor course significant for SROM, previous cesarean section. Membrane Rupture Time/Date: 9:15 PM ,04/05/2017   The patient went for cesarean section due to Elective Repeat, and delivered a Viable infant,04/06/2017  Details of operation can be found in separate operative note. Patient had an uncomplicated postpartum course.  She is ambulating,tolerating a regular diet, passing flatus, and urinating well.  Patient is discharged home in stable condition 04/08/17.  Physical exam  Vitals:   04/07/17 0000 04/07/17 0500 04/07/17 1839 04/08/17 0600  BP: (!) 100/50 (!) 100/56 113/72 (!) 111/53  Pulse: 90 88 (!) 106 75  Resp: 18 18 (!) 24 20  Temp: 98.3 F (36.8 C) 98.1 F (36.7 C) 98.3 F (36.8 C) 98.1 F (36.7 C)  TempSrc: Oral Oral Oral   SpO2: 98% 98% 98%   Weight:      Height:       General: alert, cooperative and no distress Lochia: appropriate Uterine Fundus: firm Incision: Healing well with no significant drainage, No significant erythema, Dressing is clean, dry, and intact DVT Evaluation: No evidence of DVT seen on physical exam. Negative Homan's sign. Labs: Lab Results  Component Value Date   WBC 9.3 04/07/2017   HGB 8.1 (L) 04/07/2017   HCT 24.9 (L) 04/07/2017   MCV  71.1 (L) 04/07/2017   PLT 228 04/07/2017   CMP Latest Ref Rng & Units 02/24/2017  Glucose 65 - 99 mg/dL 147(W148(H)  BUN 6 - 20 mg/dL 10  Creatinine 2.950.44 - 6.211.00 mg/dL 3.080.53  Sodium 657135 - 846145 mmol/L 133(L)  Potassium 3.5 - 5.1 mmol/L 4.2  Chloride 101 - 111 mmol/L 103  CO2 22 - 32 mmol/L 20(L)  Calcium 8.9 - 10.3 mg/dL 9.2  Total Protein 6.5 - 8.1 g/dL 7.3  Total Bilirubin 0.3 - 1.2 mg/dL 0.6  Alkaline Phos 38 - 126 U/L 63  AST 15 - 41 U/L 20  ALT 14 - 54 U/L 10(L)    Discharge instruction: per After Visit Summary and "Baby and Me Booklet".  After visit meds:  Allergies as of 04/08/2017      Reactions   Zofran [ondansetron Hcl] Nausea And Vomiting      Medication List    TAKE these medications   docusate sodium 100 MG capsule Commonly known as:  COLACE Take 1 capsule (100 mg total)  by mouth 2 (two) times daily as needed.   ferrous sulfate 325 (65 FE) MG tablet Commonly known as:  FERROUSUL Take 1 tablet (325 mg total) by mouth 2 (two) times daily.   ibuprofen 800 MG tablet Commonly known as:  ADVIL,MOTRIN Take 1 tablet (800 mg total) by mouth every 8 (eight) hours as needed for moderate pain or cramping.   oxyCODONE-acetaminophen 5-325 MG tablet Commonly known as:  PERCOCET/ROXICET Take 1-2 tablets by mouth every 6 (six) hours as needed for severe pain (pain scale > 7).       Diet: routine diet  Activity: Advance as tolerated. Pelvic rest for 6 weeks.   Outpatient follow up:4 weeks Follow up Appt:No future appointments. Follow up Visit:No Follow-up on file.  Postpartum contraception: Nexplanon  Newborn Data: Live born female  Birth Weight: 7 lb 11.3 oz (3495 g) APGAR: 9, 10  Baby Feeding: Breast Disposition:home with mother   04/08/2017 Jaynie Collins, MD

## 2017-04-09 ENCOUNTER — Encounter: Payer: Self-pay | Admitting: General Practice

## 2017-04-09 ENCOUNTER — Other Ambulatory Visit: Payer: Medicaid Other | Admitting: Obstetrics & Gynecology

## 2017-04-16 ENCOUNTER — Telehealth: Payer: Self-pay | Admitting: General Practice

## 2017-04-16 NOTE — Telephone Encounter (Signed)
Patient called into front office stating she recently just had a csection and hasn't felt well. Patient reports diarrhea but is relieved by kaopectate. States she hasn't been eating the best because she has been busy and on the run so that probably hasn't helped. Patient also reports slight increase in bleeding and passage of one small golf ball size clot. Patient also reports feeling like her legs and feet have started to swell. Patient reports feeling tired and exhausted with minimal sleep. Discussed with patient that swelling can be normal and we would be concerned if she was having pitting edema, headaches, dizziness, or blurry vision. Also discussed that she keep an eye on her bleeding and if it increasing to period like bleeding and continuing to pass multiple clots she should call us and let us know. Discussed using motrin for cramps as needed, taking naps while baby is sleeping and discussed BRAT diet for stomach relief. Patient verbalized understanding to all & had no questions

## 2017-04-19 ENCOUNTER — Encounter (HOSPITAL_COMMUNITY): Admission: RE | Admit: 2017-04-19 | Payer: Medicaid Other | Source: Ambulatory Visit

## 2017-04-21 ENCOUNTER — Inpatient Hospital Stay (HOSPITAL_COMMUNITY)
Admission: RE | Admit: 2017-04-21 | Payer: Medicaid Other | Source: Ambulatory Visit | Admitting: Obstetrics & Gynecology

## 2017-04-21 SURGERY — Surgical Case
Anesthesia: Regional

## 2017-04-22 ENCOUNTER — Ambulatory Visit: Payer: Medicaid Other

## 2017-04-22 VITALS — BP 137/84 | HR 74

## 2017-04-22 DIAGNOSIS — Z5189 Encounter for other specified aftercare: Secondary | ICD-10-CM

## 2017-04-22 NOTE — Progress Notes (Signed)
Patient presented to the office today for wound check.. Patient reports no pain but does have itching at this time. Steri strips were removed and incision seemed to be healing well. Patient advised to continued keeping area clean and dry. Patient has postpartum appointment scheduled.

## 2017-05-14 ENCOUNTER — Encounter: Payer: Self-pay | Admitting: Student

## 2017-05-14 ENCOUNTER — Ambulatory Visit: Payer: Medicaid Other | Admitting: Obstetrics and Gynecology

## 2017-05-14 ENCOUNTER — Ambulatory Visit (INDEPENDENT_AMBULATORY_CARE_PROVIDER_SITE_OTHER): Payer: Medicaid Other | Admitting: Student

## 2017-05-14 DIAGNOSIS — O24410 Gestational diabetes mellitus in pregnancy, diet controlled: Secondary | ICD-10-CM

## 2017-05-14 NOTE — Progress Notes (Signed)
Subjective:     Melinda Jimenez is a 31 y.o. female who presents for a postpartum visit. She is 5 weeks postpartum following a low cervical transverse Cesarean section. I have fully reviewed the prenatal and intrapartum course. The delivery was at 36 gestational weeks. Outcome: repeat cesarean section, low transverse incision. Anesthesia: epidural. Postpartum course has been uneventful. Baby's course has been uneventful. Baby is feeding by bottle - Similac Advance. Bleeding no bleeding. Bowel function is normal. Bladder function is normal. Patient is sexually active. Contraception method is none. Postpartum depression screening: negative.  Patient has had intercourse two weeks ago. (She told CMA it was two weeks ago but when I told her we couldn't give her a nexplanon if she has had intercourse she then said it was 3 weeks ago).   Review of Systems Pertinent items are noted in HPI.   Objective:    BP 138/85   Pulse 78   Wt 238 lb 1.6 oz (108 kg)   BMI 36.20 kg/m   General:  alert and cooperative   Breasts:  inspection negative, no nipple discharge or bleeding, no masses or nodularity palpable  Lungs: clear to auscultation bilaterally  Heart:  regular rate and rhythm, S1, S2 normal, no murmur, click, rub or gallop  Abdomen: soft, non-tender; bowel sounds normal; no masses,  no organomegaly   Vulva:  not evaluated  Vagina: not evaluated  Cervix:  not evaluated  Corpus: not examined  Adnexa:  not evaluated  Rectal Exam: Not performed.   UPT negative today       Assessment:    Healthy well-woman postpartum exam. Pap smear not done at today's visit.  While patient is upset that we cannot put her nexplanon in today, she is willing to come back in one week.  Plan:    1. Contraception: will return in one week for nexplanon insertion and 2 hour gtt.  2. Follow up in: 1 week or as needed.     Luna KitchensKathryn Kooistra CNM

## 2017-05-14 NOTE — Patient Instructions (Signed)
Glucose Tolerance Test During Pregnancy The glucose tolerance test (GTT) is a blood test used to determine if you have developed a type of diabetes during pregnancy (gestational diabetes). This is when your body does not properly process sugar (glucose) in the food you eat, resulting in high blood glucose levels. Typically, a GTT is done after you have had a 1-hour glucose test with results that indicate you possibly have gestational diabetes. It may also be done if:  You have a history of giving birth to very large babies or have experienced repeated fetal loss (stillbirth).  You have signs and symptoms of diabetes, such as: ? Changes in your vision. ? Tingling or numbness in your hands or feet. ? Changes in hunger, thirst, and urination not otherwise explained by your pregnancy.  The GTT lasts about 3 hours. You will be given a sugar-water solution to drink at the beginning of the test. You will have blood drawn before you drink the solution and then again 1, 2, and 3 hours after you drink it. You will not be allowed to eat or drink anything else during the test. You must remain at the testing location to make sure that your blood is drawn on time. You should also avoid exercising during the test, because exercise can alter test results. How do I prepare for this test? Eat normally for 3 days prior to the GTT test, including having plenty of carbohydrate-rich foods. Do not eat or drink anything except water during the final 12 hours before the test. In addition, your health care provider may ask you to stop taking certain medicines before the test. What do the results mean? It is your responsibility to obtain your test results. Ask the lab or department performing the test when and how you will get your results. Contact your health care provider to discuss any questions you have about your results. Range of Normal Values Ranges for normal values may vary among different labs and hospitals. You  should always check with your health care provider after having lab work or other tests done to discuss whether your values are considered within normal limits. Normal levels of blood glucose are as follows:  Fasting: less than 105 mg/dL.  1 hour after drinking the solution: less than 190 mg/dL.  2 hours after drinking the solution: less than 165 mg/dL.  3 hours after drinking the solution: less than 145 mg/dL.  Some substances can interfere with GTT results. These may include:  Blood pressure and heart failure medicines, including beta blockers, furosemide, and thiazides.  Anti-inflammatory medicines, including aspirin.  Nicotine.  Some psychiatric medicines.  Meaning of Results Outside Normal Value Ranges GTT test results that are above normal values may indicate a number of health problems, such as:  Gestational diabetes.  Acute stress response.  Cushing syndrome.  Tumors such as pheochromocytoma or glucagonoma.  Long-term kidney problems.  Pancreatitis.  Hyperthyroidism.  Current infection.  Discuss your test results with your health care provider. He or she will use the results to make a diagnosis and determine a treatment plan that is right for you. This information is not intended to replace advice given to you by your health care provider. Make sure you discuss any questions you have with your health care provider. Document Released: 03/18/2012 Document Revised: 02/23/2016 Document Reviewed: 01/22/2014 Elsevier Interactive Patient Education  2017 Elsevier Inc.  

## 2017-05-14 NOTE — Progress Notes (Signed)
Patient is here for postpartum visit. Patient states C- section healing good, but she has been having very bad abdominal cramping at a level 6 and this started 1 week ago.

## 2017-05-20 ENCOUNTER — Telehealth: Payer: Self-pay | Admitting: *Deleted

## 2017-05-20 DIAGNOSIS — Z789 Other specified health status: Secondary | ICD-10-CM

## 2017-05-20 NOTE — Telephone Encounter (Signed)
Melinda Jimenez called today and left a voicemail  She would like Plan B called in to [facility] and would like a call back.

## 2017-05-21 MED ORDER — LEVONORGESTREL 1.5 MG PO TABS: 1.5000 mg | ORAL_TABLET | Freq: Once | ORAL | 0 refills | Status: AC

## 2017-05-21 NOTE — Telephone Encounter (Signed)
Called patient and informed her of Rx sent to pharmacy. Patient verbalized understanding and states she had unprotected sex on Sunday. Discussed with patient that we will need to reschedule her nexplanon insertion appt as she has had unprotected sex but we will still do the 2 hr gtt on Thursday. Patient verbalized understanding & will await phone call from front office with new appt. Patient had no questions

## 2017-05-23 ENCOUNTER — Other Ambulatory Visit: Payer: Medicaid Other

## 2017-05-23 ENCOUNTER — Ambulatory Visit: Payer: Medicaid Other | Admitting: Obstetrics and Gynecology

## 2017-07-03 ENCOUNTER — Inpatient Hospital Stay (HOSPITAL_COMMUNITY)
Admission: AD | Admit: 2017-07-03 | Discharge: 2017-07-03 | Disposition: A | Discharge status: Home / Self Care | Payer: Medicaid Other | Source: Ambulatory Visit | Attending: Obstetrics & Gynecology | Admitting: Obstetrics & Gynecology

## 2017-07-03 ENCOUNTER — Encounter (HOSPITAL_COMMUNITY): Payer: Self-pay | Admitting: *Deleted

## 2017-07-03 DIAGNOSIS — Z3202 Encounter for pregnancy test, result negative: Secondary | ICD-10-CM | POA: Diagnosis not present

## 2017-07-03 DIAGNOSIS — R300 Dysuria: Secondary | ICD-10-CM

## 2017-07-03 DIAGNOSIS — Z7722 Contact with and (suspected) exposure to environmental tobacco smoke (acute) (chronic): Secondary | ICD-10-CM | POA: Insufficient documentation

## 2017-07-03 DIAGNOSIS — M545 Low back pain, unspecified: Secondary | ICD-10-CM

## 2017-07-03 DIAGNOSIS — R109 Unspecified abdominal pain: Secondary | ICD-10-CM | POA: Diagnosis not present

## 2017-07-03 DIAGNOSIS — R1032 Left lower quadrant pain: Secondary | ICD-10-CM | POA: Diagnosis not present

## 2017-07-03 DIAGNOSIS — N3001 Acute cystitis with hematuria: Secondary | ICD-10-CM

## 2017-07-03 LAB — URINALYSIS, ROUTINE W REFLEX MICROSCOPIC
Bilirubin Urine: NEGATIVE
GLUCOSE, UA: NEGATIVE mg/dL
Ketones, ur: NEGATIVE mg/dL
Leukocytes, UA: NEGATIVE
Nitrite: NEGATIVE
Protein, ur: 100 mg/dL — AB
Specific Gravity, Urine: 1.024 (ref 1.005–1.030)
pH: 5 (ref 5.0–8.0)

## 2017-07-03 LAB — POCT PREGNANCY, URINE: Preg Test, Ur: NEGATIVE

## 2017-07-03 MED ORDER — SULFAMETHOXAZOLE-TRIMETHOPRIM 800-160 MG PO TABS
1.0000 | ORAL_TABLET | Freq: Two times a day (BID) | ORAL | 0 refills | Status: DC
Start: 2017-07-03 — End: 2017-11-04

## 2017-07-03 MED ORDER — FLUCONAZOLE 150 MG PO TABS: 150.0000 mg | ORAL_TABLET | Freq: Once | ORAL | 0 refills | Status: AC

## 2017-07-03 NOTE — MAU Provider Note (Signed)
Chief Complaint:  Abdominal Pain; Back Pain; and Dysuria   First Provider Initiated Contact with Patient 07/03/17 1545      HPI: Melinda Jimenez is a 31 y.o. [id number] who presents to maternity admissions reporting low back pain, pulling sensation with urination.  Started 4 days ago, started taking Doxycycline 2 days ago (had some leftover) with some relief.  Started period when arrived here today, now thinks the pain may be related to that.. She reports vaginal bleeding, but denies vaginal itching/burning, h/a, dizziness, n/v, or fever/chills.    Abdominal Pain  This is a new problem. The current episode started in the past 7 days. The onset quality is gradual. The problem occurs intermittently. The problem has been unchanged. The pain is located in the suprapubic region and LLQ. The pain is mild. The quality of the pain is cramping. The abdominal pain radiates to the back. Associated symptoms include constipation and dysuria. Pertinent negatives include no diarrhea, fever, frequency, headaches, myalgias, nausea or vomiting. Nothing aggravates the pain. The pain is relieved by nothing. She has tried antibiotics for the symptoms.  Back Pain  This is a new problem. The current episode started in the past 7 days. The problem occurs constantly. The pain is present in the lumbar spine. The pain is mild. Stiffness is present all day. Associated symptoms include abdominal pain and dysuria. Pertinent negatives include no fever or headaches.  Dysuria   This is a new problem. The current episode started in the past 7 days. The problem occurs every urination. The pain is mild. There has been no fever. Pertinent negatives include no chills, discharge, flank pain, frequency, nausea, urgency or vomiting. She has tried antibiotics for the symptoms. The treatment provided mild relief.    RN Note: Thinks she has a urinary tract infection.  Pulls when she pees, LLQ pain and pain in low back on left side. Going on for 4  days, getting worse.   Past Medical History: Past Medical History:  Diagnosis Date  . Anemia   . Anxiety    off meds with preg  . Carpal tunnel syndrome   . Chlamydia   . 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 GERD (gastroesophageal reflux disease)   . Gestational diabetes    diet controlled  . Headache(784.0)   . IBS (irritable bowel syndrome)   . Infection    UTI  . Mood swings   . Panic attack   . Thyroid disease   . Trichomonas     Past obstetric history: OB History  Gravida Para Term Preterm AB Living  SAB TAB Ectopic Multiple Live Births  2 3   0 3    # Outcome Date GA Lbr Len/2nd Weight Sex Delivery Anes PTL Lv  9 Preterm 04/06/17 [redacted]w[redacted]d  7 lb 11.3 oz (3.495 kg) F CS-LTranv Spinal  LIV  8 Term 05/20/13 [redacted]w[redacted]d  6 lb 4 oz (2.835 kg) M CS-LTranv Spinal, EPI       Birth Comments: C-cestion due to active HSV lesions at time of delivery  7 Term 2007 [redacted]w[redacted]d   M Vag-Spont EPI  LIV  6 Term 2005 [redacted]w[redacted]d   M Vag-Spont EPI  DEC  5 TAB           4 TAB              Birth Comments: System Generated. Please review  and update pregnancy details.  3 TAB           2 SAB         ND  1 SAB               Past Surgical History: Past Surgical History:  Procedure Laterality Date  . CESAREAN SECTION N/A 05/20/2013   Procedure: CESAREAN SECTION;  Surgeon: Brock Bad, MD;  Location: [facility]S;  Service: Obstetrics;  Laterality: N/A;  . CESAREAN SECTION N/A 04/06/2017   Procedure: CESAREAN SECTION;  Surgeon: Hermina Staggers, MD;  Location: [facility] BIRTHING SUITES;  Service: Obstetrics;  Laterality: N/A;  . INDUCED ABORTION  2009  . VAGINAL DELIVERY  2007    Family History: Family History  Problem Relation Age of Onset  . Depression Mother   . Anxiety disorder Mother   . Breast cancer Other   . Bipolar disorder Brother   . Diabetes Maternal Grandmother   . Cancer Maternal Grandmother   . Hypertension Maternal Grandmother   . Hypertension  Paternal Grandmother   . Autism Unknown   . Schizophrenia Unknown     Social History: Social History  Substance Use Topics  . Smoking status: Passive Smoke Exposure - Never Smoker  . Smokeless tobacco: Never Used     Comment: was not a daily smoker, tried it  . Alcohol use No     Comment: Twice a month    Allergies:  Allergies  Allergen Reactions  . Zofran [Ondansetron Hcl] Nausea And Vomiting    Meds:  Prescriptions Prior to Admission  Medication Sig Dispense Refill Last Dose  . docusate sodium (COLACE) 100 MG capsule Take 1 capsule (100 mg total) by mouth 2 (two) times daily as needed. (Patient not taking: Reported on 04/22/2017) 30 capsule 2 Not Taking  . ferrous sulfate (FERROUSUL) 325 (65 FE) MG tablet Take 1 tablet (325 mg total) by mouth 2 (two) times daily. (Patient not taking: Reported on 04/22/2017) 60 tablet 1 Not Taking  . ibuprofen (ADVIL,MOTRIN) 800 MG tablet Take 1 tablet (800 mg total) by mouth every 8 (eight) hours as needed for moderate pain or cramping. (Patient not taking: Reported on 04/22/2017) 30 tablet 2 Not Taking  . oxyCODONE-acetaminophen (PERCOCET/ROXICET) 5-325 MG tablet Take 1-2 tablets by mouth every 6 (six) hours as needed for severe pain (pain scale > 7). (Patient not taking: Reported on 04/22/2017) 30 tablet 0 Not Taking  . valACYclovir (VALTREX) 1000 MG tablet   1     I have reviewed patient's Past Medical Hx, Surgical Hx, Family Hx, Social Hx, medications and allergies.  ROS:  Review of Systems  Constitutional: Negative for chills and fever.  Gastrointestinal: Positive for abdominal pain and constipation. Negative for diarrhea, nausea and vomiting.  Genitourinary: Positive for dysuria. Negative for flank pain, frequency and urgency.  Musculoskeletal: Positive for back pain. Negative for myalgias.  Neurological: Negative for headaches.   Other systems negative     Physical Exam  Patient Vitals for the past 24 hrs:  BP Temp Temp src Pulse  Resp SpO2 Weight  07/03/17 1441 135/88 97.7 F (36.5 C) Oral 85 16 99 % 238 lb 8 oz (108.2 kg)   Constitutional: Well-developed, well-nourished female in no acute distress.  Cardiovascular: normal rate and rhythm, no ectopy audible, S1 & S2 heard, no murmur Respiratory: normal effort, no distress. Lungs CTAB with no wheezes or crackles GI: Abd soft, non-tender.  Nondistended.  No rebound, No guarding.  Bowel Sounds audible  MS: Extremities nontender, no edema, normal ROM Neurologic: Alert and oriented x 4.   Grossly nonfocal. GU: Neg CVAT.  Slight tenderness over lower pelvic area Skin:  Warm and Dry Psych:  Affect appropriate.  PELVIC EXAM: deferred   Labs: Results for orders placed or performed during the hospital encounter of 07/03/17 (from the past 24 hour(s))  Urinalysis, Routine w reflex microscopic     Status: Abnormal   Collection Time: 07/03/17  2:43 PM  Result Value Ref Range   Color, Urine YELLOW YELLOW   APPearance CLEAR CLEAR   Specific Gravity, Urine 1.024 1.005 - 1.030   pH 5.0 5.0 - 8.0   Glucose, UA NEGATIVE NEGATIVE mg/dL   Hgb urine dipstick MODERATE (A) NEGATIVE   Bilirubin Urine NEGATIVE NEGATIVE   Ketones, ur NEGATIVE NEGATIVE mg/dL   Protein, ur 161 (A) NEGATIVE mg/dL   Nitrite NEGATIVE NEGATIVE   Leukocytes, UA NEGATIVE NEGATIVE   RBC / HPF 0-5 0 - 5 RBC/hpf   WBC, UA 0-5 0 - 5 WBC/hpf   Bacteria, UA RARE (A) NONE SEEN   Squamous Epithelial / LPF 0-5 (A) NONE SEEN   Mucus PRESENT   Pregnancy, urine POC     Status: None   Collection Time: 07/03/17  3:18 PM  Result Value Ref Range   Preg Test, Ur NEGATIVE NEGATIVE   --/--/A POS (07/06 2355)  Imaging:  No results found.  MAU Course/MDM: I have ordered labs as follows: Urine culture and sensitivities Imaging ordered: none Results reviewed. Possible UTI Will culture and treat with 3 day bactrim ds   Pt stable at time of discharge.  Assessment: Dysuria Low back pain Possible  UTI  Plan: Discharge home Recommend Push fluids Rx sent for Bactrim DS for possible UTI Rx Diflucan Stop Doxycycline  Encouraged to return here or to other Urgent Care/ED if she develops worsening of symptoms, increase in pain, fever, or other concerning symptoms.   Wynelle Bourgeois CNM, MSN Certified Nurse-Midwife 07/03/2017 3:45 PM

## 2017-07-03 NOTE — Discharge Instructions (Signed)

## 2017-07-03 NOTE — MAU Note (Signed)
Thinks she has a urinary tract infection.  Pulls when she pees, LLQ pain and pain in low back on left side. Going on for 4 days, getting worse.

## 2017-07-04 LAB — URINE CULTURE: Culture: NO GROWTH

## 2017-07-15 ENCOUNTER — Ambulatory Visit: Payer: Medicaid Other | Admitting: Obstetrics and Gynecology

## 2017-07-15 ENCOUNTER — Telehealth: Payer: Self-pay | Admitting: *Deleted

## 2017-07-15 NOTE — Telephone Encounter (Signed)
Kennedy missed a scheduled 2hr gtt , nexplanon . Per discussion with provider I called her and notifed her . She would like to reschedule. I explained will send message to registrars to reschedule and call her with appt.

## 2017-08-09 ENCOUNTER — Other Ambulatory Visit: Payer: Medicaid Other

## 2017-08-09 ENCOUNTER — Ambulatory Visit: Payer: Medicaid Other | Admitting: Nurse Practitioner

## 2017-08-26 ENCOUNTER — Ambulatory Visit: Payer: Medicaid Other | Admitting: Obstetrics and Gynecology

## 2017-08-28 ENCOUNTER — Ambulatory Visit: Payer: Medicaid Other | Admitting: Nurse Practitioner

## 2017-11-04 ENCOUNTER — Ambulatory Visit (HOSPITAL_COMMUNITY)
Admission: EM | Admit: 2017-11-04 | Discharge: 2017-11-04 | Disposition: A | Discharge status: Home / Self Care | Payer: Medicaid Other | Attending: Family Medicine | Admitting: Family Medicine

## 2017-11-04 ENCOUNTER — Other Ambulatory Visit: Payer: Self-pay

## 2017-11-04 ENCOUNTER — Encounter (HOSPITAL_COMMUNITY): Payer: Self-pay | Admitting: Emergency Medicine

## 2017-11-04 DIAGNOSIS — Z3202 Encounter for pregnancy test, result negative: Secondary | ICD-10-CM | POA: Diagnosis not present

## 2017-11-04 DIAGNOSIS — K529 Noninfective gastroenteritis and colitis, unspecified: Secondary | ICD-10-CM | POA: Diagnosis not present

## 2017-11-04 LAB — POCT PREGNANCY, URINE: PREG TEST UR: NEGATIVE

## 2017-11-04 NOTE — Discharge Instructions (Signed)

## 2017-11-04 NOTE — ED Provider Notes (Signed)
[facility]   [id number] 11/04/17 Arrival Time: 1036  ASSESSMENT & PLAN:  1. Gastroenteritis    Declines anti-emetics. Tolerating PO fluids. Work note given.  Will do her best to ensure adequate fluid intake in order to avoid dehydration. Will proceed to the Emergency Department for evaluation if unable to tolerate PO fluids regularly.  Otherwise she ill f/u with his PCP or here if not showing improvement over the next 48-72 hours.  Reviewed expectations re: course of current medical issues. Questions answered. Outlined signs and symptoms indicating need for more acute intervention. Patient verbalized understanding. After Visit Summary given.   SUBJECTIVE: History from: patient.  Melinda Jimenez is a 32 y.o. female who presents with complaint of intermittent nausea and vomiting of undigested food along with non-bloody diarrhea. Onset abrupt, today. Abdominal discomfort: none. Symptoms are unchanged since beginning. Aggravating factors: eating. Alleviating factors: none. Associated symptoms: anorexia and myalgias. She denies arthralgias, belching, chills, dysuria, fever and sweats. Appetite: decreased. PO intake: decreased. Ambulatory without assistance. Urinary symptoms: none. Last bowel movement a few hours ago without blood. OTC treatment: None.  Patient's last menstrual period was 10/07/2017 (exact date).  Past Surgical History:  Procedure Laterality Date  . CESAREAN SECTION N/A 05/20/2013   Procedure: CESAREAN SECTION;  Surgeon: Brock Bad, MD;  Location: [facility];  Service: Obstetrics;  Laterality: N/A;  . CESAREAN SECTION N/A 04/06/2017   Procedure: CESAREAN SECTION;  Surgeon: Hermina Staggers, MD;  Location: [facility] B[facility] SUIT[facility];  Service: Obstetrics;  Laterality: N/A;  . INDUCED ABORTION  2009  . VAGINAL DELIVERY  2007    ROS: As per HPI.  OBJECTIVE:  Vitals:   11/04/17 1154  BP: 128/82  Pulse: 82  Resp: 18  Temp: 98 F (36.7 C)  TempSrc: Oral  SpO2:  100%    General appearance: alert; no distress Oropharynx: moist Lungs: clear to auscultation bilaterally Heart: regular rate and rhythm Abdomen: soft; non-distended; no significant abdominal tenderness, "just a cramping feeling"; bowel sounds present; no masses or organomegaly; no guarding or rebound tenderness Back: no CVA tenderness Extremities: no edema; symmetrical with no gross deformities Skin: warm and dry Psychological: alert and cooperative; normal mood and affect  Labs: Results for orders placed or performed during the hospital encounter of 11/04/17  Pregnancy, urine POC  Result Value Ref Range   Preg Test, Ur NEGATIVE NEGATIVE   Labs Reviewed  POCT PREGNANCY, URINE    Allergies  Allergen Reactions  . Zofran [Ondansetron Hcl] Nausea And Vomiting                                               Past Medical History:  Diagnosis Date  . Anemia   . Anxiety    off meds with preg  . Carpal tunnel syndrome   . Chlamydia   . Genital HSV    Last OB: > 85yr ago; Valtrex 500 mg po prn outbreaks  . GERD (gastroesophageal reflux disease)   . Gestational diabetes    diet controlled  . Headache(784.0)   . IBS (irritable bowel syndrome)   . Infection    UTI  . Mood swings   . Panic attack   . Thyroid disease   . Trichomonas    Social History   Socioeconomic History  . Marital status: Single    Spouse name: Not on file  .  Number of children: 1  . Years of education: Not on file  . Highest education level: Not on file  Social Needs  . Financial resource strain: Not on file  . Food insecurity - worry: Not on file  . Food insecurity - inability: Not on file  . Transportation needs - medical: Not on file  . Transportation needs - non-medical: Not on file  Occupational History  . Occupation: Unemployed  Tobacco Use  . Smoking status: Passive Smoke Exposure - Never Smoker  . Smokeless tobacco: Never Used  . Tobacco comment: was not a daily smoker, tried it    Substance and Sexual Activity  . Alcohol use: No    Comment: Twice a month  . Drug use: No  . Sexual activity: Yes    Birth control/protection: None    Comment: Patient is interested in implant  Other Topics Concern  . Not on file  Social History Narrative  . Not on file   Family History  Problem Relation Age of Onset  . Depression Mother   . Anxiety disorder Mother   . Breast cancer Other   . Bipolar disorder Brother   . Diabetes Maternal Grandmother   . Cancer Maternal Grandmother   . Hypertension Maternal Grandmother   . Hypertension Paternal Grandmother   . Autism Unknown   . Schizophrenia Unknown      Mardella LaymanHagler, Shermon Bozzi, MD 11/04/17 1227

## 2017-11-04 NOTE — ED Triage Notes (Signed)
[facility] a [facility] cramping with N/V/D x 3 days.

## 2017-11-09 ENCOUNTER — Encounter (HOSPITAL_COMMUNITY): Payer: Self-pay | Admitting: Emergency Medicine

## 2017-11-09 ENCOUNTER — Ambulatory Visit (HOSPITAL_COMMUNITY)
Admission: EM | Admit: 2017-11-09 | Discharge: 2017-11-09 | Disposition: A | Discharge status: Home / Self Care | Payer: Medicaid Other | Attending: Family Medicine | Admitting: Family Medicine

## 2017-11-09 DIAGNOSIS — Z888 Allergy status to other drugs, medicaments and biological substances status: Secondary | ICD-10-CM | POA: Insufficient documentation

## 2017-11-09 DIAGNOSIS — Z7722 Contact with and (suspected) exposure to environmental tobacco smoke (acute) (chronic): Secondary | ICD-10-CM | POA: Diagnosis not present

## 2017-11-09 DIAGNOSIS — K529 Noninfective gastroenteritis and colitis, unspecified: Secondary | ICD-10-CM | POA: Insufficient documentation

## 2017-11-09 DIAGNOSIS — Z9889 Other specified postprocedural states: Secondary | ICD-10-CM | POA: Diagnosis not present

## 2017-11-09 DIAGNOSIS — R109 Unspecified abdominal pain: Secondary | ICD-10-CM | POA: Diagnosis present

## 2017-11-09 MED ORDER — DICYCLOMINE HCL 20 MG PO TABS: 20.0000 mg | ORAL_TABLET | Freq: Two times a day (BID) | ORAL | 0 refills | Status: DC

## 2017-11-09 MED ORDER — PROMETHAZINE HCL 6.25 MG/5ML PO SYRP: 6.2500 mg | ORAL_SOLUTION | Freq: Four times a day (QID) | ORAL | 0 refills | Status: DC | PRN

## 2017-11-09 NOTE — ED Triage Notes (Signed)
PT C/O: reports possible "food poisoning"  Sx include abd pain, nausea, vomiting, diarrhea  Also reports sore throat but not sure if it's due to the vomiting  A&O x4... NAD... Ambulatory

## 2017-11-09 NOTE — Discharge Instructions (Addendum)
Your strep test is negative.  Keep pushing fluids (Gatorade).  Try different foods to see what you can tolerate.   Ibuprofen 400-600 mg (2-3 over the counter strength tabs) every 6 hours as needed for pain.  OK to take Tylenol 1000 mg (2 extra strength tabs) or 975 mg (3 regular strength tabs) every 6 hours as needed.  Follow up with PCP if symptoms fail to improve.

## 2017-11-09 NOTE — ED Provider Notes (Signed)
[facility]    CSN: [id number] Arrival date & time: 11/09/17  1459     History   Chief Complaint Chief Complaint  Patient presents with  . Abdominal Pain    HPI Melinda Jimenez is a 32 y.o. female.   HPI   3 nights ago, the patient went to eat at a restaurant.  She and her boyfriend were eating wings and found that they were raw.  The following day, she started having abdominal cramping, nausea, vomiting, and diarrhea.  It is not gotten better since then.  She has not taken anything at home.  She is unable to keep anything down without throwing up or becoming nauseous.  Her oral intake is decreased because of this.  She denies any bleeding or fevers.  She she has a sore throat that started yesterday.  She believes she has strep.  She is not coughing.  Her boyfriend is having similar symptoms.  Past Medical History:  Diagnosis Date  . Anemia   . Anxiety    off meds with preg  . Carpal tunnel syndrome   . Chlamydia   . Genital HSV    Last OB: > [age]yr ago; Valtrex 500 mg po prn outbreaks  . GERD (gastroesophageal reflux disease)   . Gestational diabetes    diet controlled  . Headache(784.0)   . IBS (irritable bowel syndrome)   . Infection    UTI  . Mood swings   . Panic attack   . Thyroid disease   . Trichomonas     Patient Active Problem List   Diagnosis Date Noted  . Postpartum care following cesarean delivery 04/06/2017  . H/O: cesarean section 09/27/2016  . GERD without esophagitis 01/20/2013  . Anxiety and depression 01/20/2013  . CARPAL TUNNEL SYNDROME 06/02/2010  . Vitamin D deficiency 04/19/2010  . Herpes genitalis in women 04/18/2010  . Goiter, unspecified 04/18/2010    Past Surgical History:  Procedure Laterality Date  . CESAREAN SECTION N/A 05/20/2013   Procedure: CESAREAN SECTION;  Surgeon: [name], MD;  Location: [facility];  Service: Obstetrics;  Laterality: N/A;  . CESAREAN SECTION N/A 04/06/2017   Procedure: CESAREAN SECTION;   Surgeon: Hermina StaggersErvin, Michael L, MD;  [facility];  Service: Obstetrics;  Laterality: N/A;  . INDUCED ABORTION  2009  . VAGINAL DELIVERY  2007    OB History    Gravida Para Term Preterm AB Living   9 4 3 1 5 3    SAB TAB Ectopic Multiple Live Births   2 3   0 3       Home Medications   Takes no meds routinely  Family History Family History  Problem Relation Age of Onset  . Depression Mother   . Anxiety disorder Mother   . Breast cancer Other   . Bipolar disorder Brother   . Diabetes Maternal Grandmother   . Cancer Maternal Grandmother   . Hypertension Maternal Grandmother   . Hypertension Paternal Grandmother   . Autism Unknown   . Schizophrenia Unknown     Social History Social History   Tobacco Use  . Smoking status: Passive Smoke Exposure - Never Smoker  . Smokeless tobacco: Never Used  . Tobacco comment: was not a daily smoker, tried it  Substance Use Topics  . Alcohol use: No    Comment: Twice a month  . Drug use: No     Allergies   Zofran [ondansetron hcl]   Review of Systems Review  of Systems  HENT: Positive for sore throat.   Gastrointestinal: Positive for abdominal pain, diarrhea and nausea.     Physical Exam Triage Vital Signs ED Triage Vitals  Enc Vitals Group     BP 11/09/17 1633 112/70     Pulse Rate 11/09/17 1633 88     Resp 11/09/17 1633 20     Temp 11/09/17 1633 99.3 F (37.4 C)     Temp Source 11/09/17 1633 Oral     SpO2 11/09/17 1633 100 %   Updated Vital Signs BP 112/70 (BP Location: Left Arm)   Pulse 88   Temp 99.3 F (37.4 C) (Oral)   Resp 20   LMP 10/12/2017   SpO2 100%   Breastfeeding? No   Physical Exam  Constitutional: She appears well-developed.  HENT:  Head: Normocephalic.  Mouth/Throat: Oropharynx is clear and moist. No oropharyngeal exudate.  Cardiovascular: Normal rate and regular rhythm.  Pulmonary/Chest: Effort normal and breath sounds normal. No respiratory distress.  Abdominal: Soft.  Bowel sounds are normal. She exhibits no distension. There is tenderness.     UC Treatments / Results  Labs (all labs ordered are listed, but only abnormal results are displayed) Labs Reviewed  CULTURE, GROUP A STREP [facility])    Procedures Procedures none  Initial Impression / Assessment and Plan / UC Course  I have reviewed the triage vital signs and the nursing notes.  Pertinent labs & imaging results that were available during my care of the patient were reviewed by me and considered in my medical decision making (see chart for details).     32 year old female presents with signs and symptoms suggestive of gastroenteritis.  Will treat symptomatically while this passes.  Phenergan and Bentyl.  Continue to push fluids, something with electrolytes as long as she is vomiting and/or having diarrhea.  Follow-up with PCP if symptoms worsen or fail to improve.  The patient voiced understanding and agreement to the plan.  Final Clinical Impressions(s) / UC Diagnoses   Final diagnoses:  Gastroenteritis    ED Discharge Orders        Ordered    promethazine (PHENERGAN) 6.25 MG/5ML syrup  Every 6 hours PRN     11/09/17 1728    dicyclomine (BENTYL) 20 MG tablet  2 times daily     11/09/17 1728       Controlled Substance Prescriptions [place] Controlled Substance Registry consulted? Not Applicable   Sharlene Dory, [place] 11/09/17 2134

## 2017-11-11 LAB — POCT RAPID STREP A: Streptococcus, Group A Screen (Direct): NEGATIVE

## 2017-11-12 LAB — CULTURE, GROUP A STREP (THRC)

## 2017-12-23 ENCOUNTER — Ambulatory Visit: Payer: Medicaid Other | Admitting: Advanced Practice Midwife

## 2018-01-28 ENCOUNTER — Encounter (HOSPITAL_COMMUNITY): Payer: Self-pay | Admitting: Emergency Medicine

## 2018-01-28 ENCOUNTER — Ambulatory Visit (HOSPITAL_COMMUNITY)
Admission: EM | Admit: 2018-01-28 | Discharge: 2018-01-28 | Disposition: A | Discharge status: Home / Self Care | Payer: Medicaid Other | Attending: Family Medicine | Admitting: Family Medicine

## 2018-01-28 DIAGNOSIS — R110 Nausea: Secondary | ICD-10-CM

## 2018-01-28 DIAGNOSIS — Z3202 Encounter for pregnancy test, result negative: Secondary | ICD-10-CM | POA: Diagnosis not present

## 2018-01-28 DIAGNOSIS — Z202 Contact with and (suspected) exposure to infections with a predominantly sexual mode of transmission: Secondary | ICD-10-CM | POA: Diagnosis not present

## 2018-01-28 DIAGNOSIS — Z888 Allergy status to other drugs, medicaments and biological substances status: Secondary | ICD-10-CM | POA: Insufficient documentation

## 2018-01-28 DIAGNOSIS — Z7722 Contact with and (suspected) exposure to environmental tobacco smoke (acute) (chronic): Secondary | ICD-10-CM | POA: Diagnosis not present

## 2018-01-28 DIAGNOSIS — N898 Other specified noninflammatory disorders of vagina: Secondary | ICD-10-CM | POA: Diagnosis present

## 2018-01-28 DIAGNOSIS — Z113 Encounter for screening for infections with a predominantly sexual mode of transmission: Secondary | ICD-10-CM | POA: Diagnosis not present

## 2018-01-28 LAB — POCT PREGNANCY, URINE: Preg Test, Ur: NEGATIVE

## 2018-01-28 MED ORDER — PROMETHAZINE HCL 25 MG PO TABS: 25.0000 mg | ORAL_TABLET | Freq: Once | ORAL | 0 refills | Status: DC

## 2018-01-28 MED ORDER — METRONIDAZOLE 500 MG PO TABS: 2000.0000 mg | ORAL_TABLET | Freq: Once | ORAL | 0 refills | Status: AC

## 2018-01-28 MED ORDER — LEVONORGESTREL 1.5 MG PO TABS: 1.5000 mg | ORAL_TABLET | Freq: Once | ORAL | 0 refills | Status: AC

## 2018-01-28 MED ORDER — FLUCONAZOLE 150 MG PO TABS: 150.0000 mg | ORAL_TABLET | Freq: Every day | ORAL | 0 refills | Status: DC

## 2018-01-28 MED ORDER — AZITHROMYCIN 250 MG PO TABS
1000.0000 mg | ORAL_TABLET | Freq: Once | ORAL | Status: AC
  Administered 2018-01-28: 1000 mg via ORAL

## 2018-01-28 MED ORDER — AZITHROMYCIN 250 MG PO TABS
ORAL_TABLET | ORAL | Status: AC
  Filled 2018-01-28: qty 4

## 2018-01-28 MED ORDER — CEFTRIAXONE SODIUM 250 MG IJ SOLR
INTRAMUSCULAR | Status: AC
  Filled 2018-01-28: qty 250

## 2018-01-28 MED ORDER — CEFTRIAXONE SODIUM 250 MG IJ SOLR
250.0000 mg | Freq: Once | INTRAMUSCULAR | Status: AC
  Administered 2018-01-28: 250 mg via INTRAMUSCULAR

## 2018-01-28 NOTE — ED Triage Notes (Signed)
Pt c/o vaginal discharge x 3 days 

## 2018-01-28 NOTE — ED Provider Notes (Signed)
[facility]    CSN: [id number] Arrival date & time: 01/28/18  1620     History   Chief Complaint Chief Complaint  Patient presents with  . Vaginal Discharge    HPI Melinda Jimenez is a 32 y.o. female.   32 year old female comes in for few day history of vaginal discharge.  She denies fever, chills, night sweats.  States has had "stomach fluttering", that feels like fetal movement that has been going on for 2 months.  Nausea without vomiting.  Denies abdominal pain.  Denies urinary symptoms such as frequency, dysuria, hematuria.  Sexually active with one female partner, no condom use.  States recently found that her her partner has multiple partners.  No birth control use.  LMP 01/12/2018.  Tried Monistat without improvement.     Past Medical History:  Diagnosis Date  . Anemia   . Anxiety    off meds with preg  . Carpal tunnel syndrome   . Chlamydia   . Genital HSV    Last OB: > 13yr ago; Valtrex 500 mg po prn outbreaks  . GERD (gastroesophageal reflux disease)   . Gestational diabetes    diet controlled  . Headache(784.0)   . IBS (irritable bowel syndrome)   . Infection    UTI  . Mood swings   . Panic attack   . Thyroid disease   . Trichomonas     Patient Active Problem List   Diagnosis Date Noted  . Postpartum care following cesarean delivery 04/06/2017  . H/O: cesarean section 09/27/2016  . GERD without esophagitis 01/20/2013  . Anxiety and depression 01/20/2013  . CARPAL TUNNEL SYNDROME 06/02/2010  . Vitamin D deficiency 04/19/2010  . Herpes genitalis in women 04/18/2010  . Goiter, unspecified 04/18/2010    Past Surgical History:  Procedure Laterality Date  . CESAREAN SECTION N/A 05/20/2013   Procedure: CESAREAN SECTION;  Surgeon: Brock Bad, MD;  Location: [facility];  Service: Obstetrics;  Laterality: N/A;  . CESAREAN SECTION N/A 04/06/2017   Procedure: CESAREAN SECTION;  Surgeon: Hermina Staggers, MD;  Location: [facility];  Service:  Obstetrics;  Laterality: N/A;  . INDUCED ABORTION  2009  . VAGINAL DELIVERY  2007    OB History    Gravida  9   Para  4   Term  3   Preterm  1   AB  5   Living  3     SAB  2   TAB  3   Ectopic      Multiple  0   Live Births  3            Home Medications    Prior to Admission medications   Medication Sig Start Date End Date Taking? Authorizing Provider  fluconazole (DIFLUCAN) 150 MG tablet Take 1 tablet (150 mg total) by mouth daily. Take second dose 72 hours later if symptoms still persists. 01/28/18   Belinda Fisher, PA-C  levonorgestrel (PLAN B 1-STEP) 1.5 MG tablet Take 1 tablet (1.5 mg total) by mouth once for 1 dose. 01/28/18 01/28/18  Cathie Hoops, Tarica Harl V, PA-C  metroNIDAZOLE (FLAGYL) 500 MG tablet Take 4 tablets (2,000 mg total) by mouth once for 1 dose. 01/28/18 01/28/18  Belinda Fisher, PA-C  promethazine (PHENERGAN) 25 MG tablet Take 1 tablet (25 mg total) by mouth once for 1 dose. 01/28/18 01/28/18  Belinda Fisher, PA-C    Family History Family History  Problem Relation Age of Onset  .  Depression Mother   . Anxiety disorder Mother   . Breast cancer Other   . Bipolar disorder Brother   . Diabetes Maternal Grandmother   . Cancer Maternal Grandmother   . Hypertension Maternal Grandmother   . Hypertension Paternal Grandmother   . Autism Unknown   . Schizophrenia Unknown     Social History Social History   Tobacco Use  . Smoking status: Passive Smoke Exposure - Never Smoker  . Smokeless tobacco: Never Used  . Tobacco comment: was not a daily smoker, tried it  Substance Use Topics  . Alcohol use: No    Comment: Twice a month  . Drug use: No     Allergies   Zofran [ondansetron hcl]   Review of Systems Review of Systems  Reason unable to perform ROS: See HPI as above.     Physical Exam Triage Vital Signs ED Triage Vitals [01/28/18 1642]  Enc Vitals Group     BP 118/80     Pulse Rate 95     Resp 16     Temp 98.3 F (36.8 C)     Temp Source Oral     SpO2  97 %     Weight      Height      Head Circumference      Peak Flow      Pain Score      Pain Loc      Pain Edu?      Excl. in GC?    No data found.  Updated Vital Signs BP 118/80 (BP Location: Left Arm)   Pulse 95   Temp 98.3 F (36.8 C) (Oral)   Resp 16   LMP 01/12/2018   SpO2 97%   Physical Exam  Constitutional: She is oriented to person, place, and time. She appears well-developed and well-nourished. No distress.  Eyes: Pupils are equal, round, and reactive to light. Conjunctivae are normal.  Cardiovascular: Normal rate, regular rhythm and normal heart sounds. Exam reveals no gallop and no friction rub.  No murmur heard. Pulmonary/Chest: Effort normal and breath sounds normal. She has no wheezes. She has no rales.  Abdominal: Soft. Bowel sounds are normal. She exhibits no distension. There is no tenderness. There is no rebound, no guarding and no CVA tenderness.  Neurological: She is alert and oriented to person, place, and time.  Skin: Skin is warm and dry.     UC Treatments / Results  Labs (all labs ordered are listed, but only abnormal results are displayed) Labs Reviewed  POCT PREGNANCY, URINE  URINE CYTOLOGY ANCILLARY ONLY    EKG None  Radiology No results found.  Procedures Procedures (including critical care time)  Medications Ordered in UC Medications  azithromycin (ZITHROMAX) tablet 1,000 mg (1,000 mg Oral Given 01/28/18 1732)  cefTRIAXone (ROCEPHIN) injection 250 mg (250 mg Intramuscular Given 01/28/18 1732)    Initial Impression / Assessment and Plan / UC Course  I have reviewed the triage vital signs and the nursing notes.  Pertinent labs & imaging results that were available during my care of the patient were reviewed by me and considered in my medical decision making (see chart for details).    Urine negative for pregnancy.  Patient would like to be treated empirically for STDs.  Will cover for gonorrhea,  chlamydia, trichomonas.  Azithromycin and Rocephin given in office today.  Patient with history of vomiting with Flagyl, will give 1 dose of Phenergan to take with Flagyl 2000 mg 1 dose.  Cytology sent, patient will be contacted with any positive results that require additional treatment. Patient to refrain from sexual activity for the next 7 days. Return precautions given.   Will have patient follow-up with PCP/GYN for "stomach fluttering".  Patient requesting Plan B by prescription.  Last sexual activity yesterday.  Final Clinical Impressions(s) / UC Diagnoses   Final diagnoses:  Vaginal discharge    ED Prescriptions    Medication Sig Dispense Auth. Provider   promethazine (PHENERGAN) 25 MG tablet Take 1 tablet (25 mg total) by mouth once for 1 dose. 1 tablet Howard Bunte V, PA-C   metroNIDAZOLE (FLAGYL) 500 MG tablet Take 4 tablets (2,000 mg total) by mouth once for 1 dose. 4 tablet Sohail Capraro V, PA-C   fluconazole (DIFLUCAN) 150 MG tablet Take 1 tablet (150 mg total) by mouth daily. Take second dose 72 hours later if symptoms still persists. 2 tablet Xuan Mateus V, PA-C   levonorgestrel (PLAN B 1-STEP) 1.5 MG tablet Take 1 tablet (1.5 mg total) by mouth once for 1 dose. 1 tablet Threasa Alpha, [place] 01/28/18 1742

## 2018-01-28 NOTE — Discharge Instructions (Addendum)
Urine negative for pregnancy. You were treated empirically for gonorrhea, chlamydia, trichomonas.  Azithromycin 1g by mouth and Rocephin  injection given in office today.  Take Phenergan as directed, then take Flagyl.  This will treat for trichomonas, but will not cover for BV.  Diflucan for possible yeast infection.  We can call in Metro[employer]el if BV is positive.  Cytology sent, you will be contacted with any positive results that requires further treatment. Refrain from sexual activity and alcohol use for the next 7 days. Monitor for any worsening of symptoms, fever, abdominal pain, nausea, vomiting, to follow up for reevaluation.  Follow-up with PCP/GYN for further evaluation of abdominal fluttering.

## 2018-01-29 LAB — URINE CYTOLOGY ANCILLARY ONLY
Chlamydia: NEGATIVE
Neisseria Gonorrhea: NEGATIVE
Trichomonas: NEGATIVE

## 2018-01-30 NOTE — Progress Notes (Signed)
Std screening negative. Pt called and made aware.

## 2018-01-31 LAB — URINE CYTOLOGY ANCILLARY ONLY
Bacterial vaginitis: NEGATIVE
Candida vaginitis: NEGATIVE

## 2018-05-27 ENCOUNTER — Encounter (HOSPITAL_COMMUNITY): Payer: Self-pay | Admitting: Emergency Medicine

## 2018-05-27 ENCOUNTER — Ambulatory Visit (HOSPITAL_COMMUNITY)
Admission: EM | Admit: 2018-05-27 | Discharge: 2018-05-27 | Disposition: A | Discharge status: Home / Self Care | Payer: Medicaid Other | Attending: Family Medicine | Admitting: Family Medicine

## 2018-05-27 ENCOUNTER — Other Ambulatory Visit: Payer: Self-pay

## 2018-05-27 DIAGNOSIS — R300 Dysuria: Secondary | ICD-10-CM | POA: Diagnosis not present

## 2018-05-27 DIAGNOSIS — Z818 Family history of other mental and behavioral disorders: Secondary | ICD-10-CM | POA: Diagnosis not present

## 2018-05-27 DIAGNOSIS — N898 Other specified noninflammatory disorders of vagina: Secondary | ICD-10-CM | POA: Insufficient documentation

## 2018-05-27 DIAGNOSIS — Z888 Allergy status to other drugs, medicaments and biological substances status: Secondary | ICD-10-CM | POA: Diagnosis not present

## 2018-05-27 LAB — POCT URINALYSIS DIP (DEVICE)
Glucose, UA: NEGATIVE mg/dL
Hgb urine dipstick: NEGATIVE
KETONES UR: NEGATIVE mg/dL
Leukocytes, UA: NEGATIVE
Nitrite: NEGATIVE
Protein, ur: 100 mg/dL — AB
Specific Gravity, Urine: >=1.03 (ref 1.005–1.030)
Urobilinogen, UA: 1 mg/dL (ref 0.0–1.0)
pH: 7 (ref 5.0–8.0)

## 2018-05-27 LAB — POCT PREGNANCY, URINE: Preg Test, Ur: NEGATIVE

## 2018-05-27 MED ORDER — CEFTRIAXONE SODIUM 250 MG IJ SOLR
250.0000 mg | Freq: Once | INTRAMUSCULAR | Status: AC
  Administered 2018-05-27: 250 mg via INTRAMUSCULAR

## 2018-05-27 MED ORDER — AZITHROMYCIN 250 MG PO TABS
ORAL_TABLET | ORAL | Status: AC
  Filled 2018-05-27: qty 4

## 2018-05-27 MED ORDER — METRONIDAZOLE 0.75 % VA GEL: 1.0000 | Freq: Two times a day (BID) | VAGINAL | 0 refills | Status: DC

## 2018-05-27 MED ORDER — AZITHROMYCIN 250 MG PO TABS
1000.0000 mg | ORAL_TABLET | Freq: Once | ORAL | Status: AC
  Administered 2018-05-27: 1000 mg via ORAL

## 2018-05-27 MED ORDER — LIDOCAINE HCL (PF) 1 % IJ SOLN
INTRAMUSCULAR | Status: AC
  Filled 2018-05-27: qty 2

## 2018-05-27 MED ORDER — CEFTRIAXONE SODIUM 250 MG IJ SOLR
INTRAMUSCULAR | Status: AC
  Filled 2018-05-27: qty 250

## 2018-05-27 NOTE — ED Provider Notes (Signed)
[facility]    CSN: [id number] Arrival date & time: 05/27/18  1239     History   Chief Complaint Chief Complaint  Patient presents with  . Vaginal Discharge  . Abdominal Pain  . Back Pain    HPI Melinda Jimenez is a 32 y.o. female.   Pt is a 32 year old female with PMH of anemia, anxiety, chlamydia, HSV, GERD, headache,, trichomonas, panic attack, thyroid disease.  She presents with milky vaginal discharge, odor, abdominal cramping and back pain x4 days.  She also reports some dysuria.  Her symptoms have been constant but remained the same.  Denies any fever, chills, nausea.  She reports recent unprotected sex.  She is here today to be checked for STDs, pregnancy.  She is not currently taking birth control.  Last menstrual.  Was 05/01/2018.  ROS per HPI      Past Medical History:  Diagnosis Date  . Anemia   . Anxiety    off meds with preg  . Carpal tunnel syndrome   . Chlamydia   . Genital HSV    Last OB: > 75yr ago; Valtrex 500 mg po prn outbreaks  . GERD (gastroesophageal reflux disease)   . Gestational diabetes    diet controlled  . Headache(784.0)   . IBS (irritable bowel syndrome)   . Infection    UTI  . Mood swings   . Panic attack   . Thyroid disease   . Trichomonas     Patient Active Problem List   Diagnosis Date Noted  . Postpartum care following cesarean delivery 04/06/2017  . H/O: cesarean section 09/27/2016  . GERD without esophagitis 01/20/2013  . Anxiety and depression 01/20/2013  . CARPAL TUNNEL SYNDROME 06/02/2010  . Vitamin D deficiency 04/19/2010  . Herpes genitalis in women 04/18/2010  . Goiter, unspecified 04/18/2010    Past Surgical History:  Procedure Laterality Date  . CESAREAN SECTION N/A 05/20/2013   Procedure: CESAREAN SECTION;  Surgeon: Brock Bad, MD;  Location: [facility];  Service: Obstetrics;  Laterality: N/A;  . CESAREAN SECTION N/A 04/06/2017   Procedure: CESAREAN SECTION;  Surgeon: Hermina Staggers, MD;   Location: [facility];  Service: Obstetrics;  Laterality: N/A;  . INDUCED ABORTION  2009  . VAGINAL DELIVERY  2007    OB History    Gravida  9   Para  4   Term  3   Preterm  1   AB  5   Living  3     SAB  2   TAB  3   Ectopic      Multiple  0   Live Births  3            Home Medications    Prior to Admission medications   Medication Sig Start Date End Date Taking? Authorizing Provider  fluconazole (DIFLUCAN) 150 MG tablet Take 1 tablet (150 mg total) by mouth daily. Take second dose 72 hours later if symptoms still persists. 01/28/18   Cathie Hoops, Amy V, PA-C  metroNIDAZOLE (METROGEL) 0.75 % vaginal gel Place 1 Applicatorful vaginally 2 (two) times daily. 05/27/18   Dahlia Byes A, NP  promethazine (PHENERGAN) 25 MG tablet Take 1 tablet (25 mg total) by mouth once for 1 dose. 01/28/18 01/28/18  Belinda Fisher, PA-C    Family History Family History  Problem Relation Age of Onset  . Depression Mother   . Anxiety disorder Mother   . Breast cancer Other   .  Bipolar disorder Brother   . Diabetes Maternal Grandmother   . Cancer Maternal Grandmother   . Hypertension Maternal Grandmother   . Hypertension Paternal Grandmother   . Autism Unknown   . Schizophrenia Unknown     Social History Social History   Tobacco Use  . Smoking status: Passive Smoke Exposure - Never Smoker  . Smokeless tobacco: Never Used  . Tobacco comment: was not a daily smoker, tried it  Substance Use Topics  . Alcohol use: No    Comment: Twice a month  . Drug use: No     Allergies   Zofran [ondansetron hcl]   Review of Systems Review of Systems   Physical Exam Triage Vital Signs ED Triage Vitals  Enc Vitals Group     BP 05/27/18 1303 117/81     Pulse Rate 05/27/18 1303 90     Resp --      Temp 05/27/18 1303 97.9 F (36.6 C)     Temp Source 05/27/18 1303 Oral     SpO2 05/27/18 1303 97 %     Weight --      Height --      Head Circumference --      Peak Flow --      Pain  Score 05/27/18 1302 10     Pain Loc --      Pain Edu? --      Excl. in GC? --    No data found.  Updated Vital Signs BP 117/81 (BP Location: Left Arm)   Pulse 90   Temp 97.9 F (36.6 C) (Oral)   LMP 05/01/2018 (Approximate)   SpO2 97%   Breastfeeding? No   Visual Acuity Right Eye Distance:   Left Eye Distance:   Bilateral Distance:    Right Eye Near:   Left Eye Near:    Bilateral Near:     Physical Exam  Constitutional: She appears well-developed and well-nourished. No distress.  Nontoxic or ill-appearing.  Very pleasant  HENT:  Head: Normocephalic and atraumatic.  Pulmonary/Chest: Effort normal.  Abdominal: Soft. Normal appearance and bowel sounds are normal. There is generalized tenderness. There is no rebound and no CVA tenderness.  Genitourinary:  Genitourinary Comments: Self swab obtained  Neurological: She is alert.  Skin: Skin is warm and dry.  Psychiatric: She has a normal mood and affect.  Nursing note and vitals reviewed.    UC Treatments / Results  Labs (all labs ordered are listed, but only abnormal results are displayed) Labs Reviewed  POCT URINALYSIS DIP (DEVICE) - Abnormal; Notable for the following components:      Result Value   Bilirubin Urine SMALL (*)    Protein, ur 100 (*)    All other components within normal limits  POCT PREGNANCY, URINE  CERVICOVAGINAL ANCILLARY ONLY    EKG None  Radiology No results found.  Procedures Procedures (including critical care time)  Medications Ordered in UC Medications  cefTRIAXone (ROCEPHIN) injection 250 mg (250 mg Intramuscular Given 05/27/18 1353)  azithromycin (ZITHROMAX) tablet 1,000 mg (1,000 mg Oral Given 05/27/18 1353)    Initial Impression / Assessment and Plan / UC Course  I have reviewed the triage vital signs and the nursing notes.  Pertinent labs & imaging results that were available during my care of the patient were reviewed by me and considered in my medical decision making  (see chart for details).     Urine negative for pregnancy or infection. Go ahead and treat prophylactically for gonorrhea and  chlamydia We will also go ahead and treat outpatient for bacterial vaginosis Lab results pending Follow up as needed for continued or worsening symptoms  Final Clinical Impressions(s) / UC Diagnoses   Final diagnoses:  Vaginal discharge     Discharge Instructions     It was a pleasure meeting you today. I really appreciate your kind words or encouragement.  Your urine was negative for pregnancy and infection.  We are treating you for gonorrhea, chlamydia here in the clinic. Labs pending we will call you with positive results.     ED Prescriptions    Medication Sig Dispense Auth. Provider   metroNIDAZOLE (METROGEL) 0.75 % vaginal gel Place 1 Applicatorful vaginally 2 (two) times daily. 70 g Dahlia ByesBast, Kamron Vanwyhe A, NP     Controlled Substance Prescriptions [place] Controlled Substance Registry consulted? Not Applicable   Janace ArisBast, Tabb Croghan A, NP 05/27/18 1438

## 2018-05-27 NOTE — ED Triage Notes (Signed)
Pt reports a white, milky vaginal discharge with some odor along with abdominal cramping and back pain that started on Saturday.  Pt admits to a sexual encounter on Tuesday with no protection.

## 2018-05-27 NOTE — Discharge Instructions (Addendum)
It was a pleasure meeting you today. I really appreciate your kind words or encouragement.  Your urine was negative for pregnancy and infection.  We are treating you for gonorrhea, chlamydia here in the clinic. Labs pending we will call you with positive results.

## 2018-05-28 LAB — CERVICOVAGINAL ANCILLARY ONLY
Bacterial vaginitis: POSITIVE — AB
CHLAMYDIA, DNA PROBE: NEGATIVE
Candida vaginitis: NEGATIVE
Neisseria Gonorrhea: NEGATIVE
Trichomonas: NEGATIVE

## 2018-09-15 ENCOUNTER — Ambulatory Visit: Payer: Medicaid Other | Admitting: Advanced Practice Midwife

## 2018-11-14 ENCOUNTER — Encounter (HOSPITAL_COMMUNITY): Payer: Self-pay | Admitting: Emergency Medicine

## 2018-11-14 ENCOUNTER — Ambulatory Visit (HOSPITAL_COMMUNITY)
Admission: EM | Admit: 2018-11-14 | Discharge: 2018-11-14 | Disposition: A | Discharge status: Home / Self Care | Payer: Medicaid Other | Attending: Internal Medicine | Admitting: Internal Medicine

## 2018-11-14 DIAGNOSIS — B029 Zoster without complications: Secondary | ICD-10-CM

## 2018-11-14 MED ORDER — VALACYCLOVIR HCL 1 G PO TABS: 1000.0000 mg | ORAL_TABLET | Freq: Three times a day (TID) | ORAL | 0 refills | Status: AC

## 2018-11-14 MED ORDER — CYCLOBENZAPRINE HCL 10 MG PO TABS: 10.0000 mg | ORAL_TABLET | Freq: Two times a day (BID) | ORAL | 0 refills | Status: DC | PRN

## 2018-11-14 NOTE — ED Triage Notes (Signed)
Pt c/o neck pain x3 weeks states she does a lot of lifting at work. Pt states she also thinks shes having a herpes outbreak. Normally takes valtrex for outbreaks.

## 2018-11-14 NOTE — ED Provider Notes (Signed)
[facility]    CSN: [id number] Arrival date & time: 11/14/18  1237     History   Chief Complaint Chief Complaint  Patient presents with  . Back Pain  . Herpes Zoster    HPI Melinda Jimenez is a 33 y.o. female with a history of irritable bowel syndrome and genital herpes comes to the urgent care with complaints of painful labial rash of a few days duration.   Patient says the symptoms started a few days ago and is gotten progressively painful.  Pain is of moderate severity at this time.  No aggravating factors.  No relieving factors known.  Not associated with vaginal discharge, dysuria or urgency.  Patient also complains of neck pain and stiffness of 3 weeks duration.  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 HPI  Past Medical History:  Diagnosis Date  . Anemia   . Anxiety    off meds with preg  . Carpal tunnel syndrome   . Chlamydia   . Genital HSV    Last OB: > 4954yr ago; Valtrex 500 mg po prn outbreaks  . GERD (gastroesophageal reflux disease)   . Gestational diabetes    diet controlled  . Headache(784.0)   . IBS (irritable bowel syndrome)   . Infection    UTI  . Mood swings   . Panic attack   . Thyroid disease   . Trichomonas     Patient Active Problem List   Diagnosis Date Noted  . Postpartum care following cesarean delivery 04/06/2017  . H/O: cesarean section 09/27/2016  . GERD without esophagitis 01/20/2013  . Anxiety and depression 01/20/2013  . CARPAL TUNNEL SYNDROME 06/02/2010  . Vitamin D deficiency 04/19/2010  . Herpes genitalis in women 04/18/2010  . Goiter, unspecified 04/18/2010    Past Surgical History:  Procedure Laterality Date  . CESAREAN SECTION N/A 05/20/2013   Procedure: CESAREAN SECTION;  Surgeon: [name], MD;  Location: [facility];  Service: Obstetrics;  Laterality: N/A;  . CESAREAN SECTION N/A 04/06/2017   Procedure: CESAREAN SECTION;  Surgeon: Hermina StaggersErvin,  Michael L, MD;  [facility];  Service: Obstetrics;  Laterality: N/A;  . INDUCED ABORTION  2009  . VAGINAL DELIVERY  2007    OB History    Gravida  9   Para  4   Term  3   Preterm  1   AB  5   Living  3     SAB  2   TAB  3   Ectopic      Multiple  0   Live Births  3            Home Medications    Prior to Admission medications   Medication Sig Start Date End Date Taking? Authorizing Provider  cyclobenzaprine (FLEXERIL) 10 MG tablet Take 1 tablet (10 mg total) by mouth 2 (two) times daily as needed for muscle spasms. 11/14/18   Khale Nigh, Britta MccreedyPhilip O, MD  valACYclovir (VALTREX) 1000 MG tablet Take 1 tablet (1,000 mg total) by mouth 3 (three) times daily for 7 days. 11/14/18 11/21/18  Merrilee JanskyLamptey, Diallo Ponder O, MD    Family History Family History  Problem Relation Age of Onset  . Depression Mother   . Anxiety disorder Mother   . Breast cancer Other   . Bipolar disorder Brother   . Diabetes Maternal Grandmother   . Cancer Maternal Grandmother   . Hypertension Maternal  Grandmother   . Hypertension Paternal Grandmother   . Autism Other   . Schizophrenia Other     Social History Social History   Tobacco Use  . Smoking status: Passive Smoke Exposure - Never Smoker  . Smokeless tobacco: Never Used  . Tobacco comment: was not a daily smoker, tried it  Substance Use Topics  . Alcohol use: No    Comment: Twice a month  . Drug use: No     Allergies   Zofran [ondansetron hcl]   Review of Systems Review of Systems  Constitutional: Negative for activity change and appetite change.  Genitourinary: Positive for genital sores. Negative for dysuria, frequency, urgency and vaginal discharge.  Musculoskeletal: Positive for neck pain and neck stiffness. Negative for arthralgias and joint swelling.  Neurological: Negative for dizziness, tremors, weakness, numbness and headaches.  Hematological: Negative for adenopathy.  Psychiatric/Behavioral: Negative for  agitation and confusion.     Physical Exam Triage Vital Signs ED Triage Vitals  Enc Vitals Group     BP 11/14/18 1303 127/84     Pulse Rate 11/14/18 1303 83     Resp 11/14/18 1303 18     Temp 11/14/18 1303 98.1 F (36.7 C)     Temp src --      SpO2 11/14/18 1303 99 %     Weight --      Height --      Head Circumference --      Peak Flow --      Pain Score 11/14/18 1304 10     Pain Loc --      Pain Edu? --      Excl. in GC? --    No data found.  Updated Vital Signs BP 127/84   Pulse 83   Temp 98.1 F (36.7 C)   Resp 18   LMP 11/09/2018   SpO2 99%   Visual Acuity Right Eye Distance:   Left Eye Distance:   Bilateral Distance:    Right Eye Near:   Left Eye Near:    Bilateral Near:     Physical Exam Constitutional:      General: She is not in acute distress.    Appearance: Normal appearance. She is not ill-appearing.  HENT:     Mouth/Throat:     Mouth: Mucous membranes are moist.  Neck:     Musculoskeletal: Normal range of motion. Neck rigidity present.  Cardiovascular:     Rate and Rhythm: Normal rate and regular rhythm.  Pulmonary:     Effort: Pulmonary effort is normal.     Breath sounds: Normal breath sounds.  Lymphadenopathy:     Cervical: No cervical adenopathy.  Skin:    Capillary Refill: Capillary refill takes less than 2 seconds.  Neurological:     Mental Status: She is alert.     Cranial Nerves: No cranial nerve deficit.     Motor: No weakness.     Coordination: Coordination normal.     Gait: Gait normal.     Deep Tendon Reflexes: Reflexes normal.      UC Treatments / Results  Labs (all labs ordered are listed, but only abnormal results are displayed) Labs Reviewed - No data to display  EKG None  Radiology No results found.  Procedures Procedures (including critical care time)  Medications Ordered in UC Medications - No data to display  Initial Impression / Assessment and Plan / UC Course  I have reviewed the triage vital  signs and the nursing  notes.  Pertinent labs & imaging results that were available during my care of the patient were reviewed by me and considered in my medical decision making (see chart for details).     1.  Genital herpes outbreak, recurrent: Valtrex 1 g 3 times daily for 7 days 2.  Musculoskeletal neck pain: Flexeril Warm compress Gentle range of motion  Final Clinical Impressions(s) / UC Diagnoses   Final diagnoses:  Herpes zoster without complication   Discharge Instructions   None    ED Prescriptions    Medication Sig Dispense Auth. Provider   valACYclovir (VALTREX) 1000 MG tablet Take 1 tablet (1,000 mg total) by mouth 3 (three) times daily for 7 days. 21 tablet Mariadel Mruk, Britta Mccreedy, MD   cyclobenzaprine (FLEXERIL) 10 MG tablet Take 1 tablet (10 mg total) by mouth 2 (two) times daily as needed for muscle spasms. 20 tablet Airiel Oblinger, Britta Mccreedy, MD     Controlled Substance Prescriptions [place] Controlled Substance Registry consulted? No   Merrilee Jansky, MD 11/14/18 1413

## 2018-11-26 ENCOUNTER — Other Ambulatory Visit: Payer: Self-pay

## 2018-11-26 ENCOUNTER — Emergency Department (HOSPITAL_COMMUNITY)
Admission: EM | Admit: 2018-11-26 | Discharge: 2018-11-27 | Disposition: A | Discharge status: Home / Self Care | Payer: Medicaid Other | Attending: Emergency Medicine | Admitting: Emergency Medicine

## 2018-11-26 ENCOUNTER — Encounter (HOSPITAL_COMMUNITY): Payer: Self-pay

## 2018-11-26 DIAGNOSIS — L739 Follicular disorder, unspecified: Secondary | ICD-10-CM | POA: Insufficient documentation

## 2018-11-26 DIAGNOSIS — Y929 Unspecified place or not applicable: Secondary | ICD-10-CM | POA: Insufficient documentation

## 2018-11-26 DIAGNOSIS — X58XXXA Exposure to other specified factors, initial encounter: Secondary | ICD-10-CM | POA: Insufficient documentation

## 2018-11-26 DIAGNOSIS — Y939 Activity, unspecified: Secondary | ICD-10-CM | POA: Diagnosis not present

## 2018-11-26 DIAGNOSIS — M542 Cervicalgia: Secondary | ICD-10-CM | POA: Diagnosis present

## 2018-11-26 DIAGNOSIS — S161XXA Strain of muscle, fascia and tendon at neck level, initial encounter: Secondary | ICD-10-CM | POA: Insufficient documentation

## 2018-11-26 DIAGNOSIS — Z7722 Contact with and (suspected) exposure to environmental tobacco smoke (acute) (chronic): Secondary | ICD-10-CM | POA: Insufficient documentation

## 2018-11-26 DIAGNOSIS — Y999 Unspecified external cause status: Secondary | ICD-10-CM | POA: Diagnosis not present

## 2018-11-26 NOTE — ED Triage Notes (Signed)
Pt arrives POV for eval of neck/back pain x 3 weeks. 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 Pt reports she has been taking valtrex w/ no improvement.

## 2018-11-27 LAB — CBG MONITORING, ED: Glucose-Capillary: 98 mg/dL (ref 70–99)

## 2018-11-27 MED ORDER — LIDOCAINE 5 % EX PTCH: 1.0000 | MEDICATED_PATCH | CUTANEOUS | 0 refills | Status: DC

## 2018-11-27 MED ORDER — IBUPROFEN 400 MG PO TABS
600.0000 mg | ORAL_TABLET | Freq: Once | ORAL | Status: AC
  Administered 2018-11-27: 600 mg via ORAL
  Filled 2018-11-27: qty 1

## 2018-11-27 NOTE — ED Provider Notes (Signed)
[facility] Provider Note   CSN: [id number] Arrival date & time: 11/26/18  2226    History   Chief Complaint Chief Complaint  Patient presents with  . Back Pain    HPI Melinda Jimenez is a 33 y.o. female with a history of chlamydia, genital HSV, GERD, gestational diabetes, thyroid disease, trichomonas, and IBS who presents to the emergency department with a chief complaint of neck pain.  The patient reports that she was evaluated by urgent care recently and was started on Valtrex for an outbreak of genital herpes.  She reports that the rash was not viewed during the visit.  She reports that since that time she has had worsening neck pain.  She has googled her symptoms and is concerned that she has a herpes infection in the spinal cord of her neck.  She characterizes the neck pain as "crushing" and states that she is unable to move her neck because it is stiff.  She reports that the pain is constant and radiates up into the lower part of her head.  Pain is worse with movement.  She reports that she has not been taking ibuprofen or Tylenol and the only thing that seems to take her symptoms better is when she takes Valtrex.  She states that she feels the infection from the herpes coming on if she is late taking her Valtrex because the pain is worse.  No recent falls or injury.  She reports that prior to the onset of her neck pain that she was working at [employer], but had to quit the job because of all of the lifting.  She denies fever, chills, visual changes, hearing changes, nausea, vomiting, cough, shortness of breath, or vaginal discharge.     The history is provided by the patient. No language interpreter was used.    Past Medical History:  Diagnosis Date  . Anemia   . Anxiety    off meds with preg  . Carpal tunnel syndrome   . Chlamydia   . Genital HSV    Last OB: > 1170yr ago; Valtrex 500 mg po prn outbreaks  . GERD (gastroesophageal reflux disease)     . Gestational diabetes    diet controlled  . Headache(784.0)   . IBS (irritable bowel syndrome)   . Infection    UTI  . Mood swings   . Panic attack   . Thyroid disease   . Trichomonas     Patient Active Problem List   Diagnosis Date Noted  . Postpartum care following cesarean delivery 04/06/2017  . H/O: cesarean section 09/27/2016  . GERD without esophagitis 01/20/2013  . Anxiety and depression 01/20/2013  . CARPAL TUNNEL SYNDROME 06/02/2010  . Vitamin D deficiency 04/19/2010  . Herpes genitalis in women 04/18/2010  . Goiter, unspecified 04/18/2010    Past Surgical History:  Procedure Laterality Date  . CESAREAN SECTION N/A 05/20/2013   Procedure: CESAREAN SECTION;  Surgeon: [name], MD;  Location: [facility];  Service: Obstetrics;  Laterality: N/A;  . CESAREAN SECTION N/A 04/06/2017   Procedure: CESAREAN SECTION;  Surgeon: Hermina StaggersErvin, Michael L, MD;  [facility];  Service: Obstetrics;  Laterality: N/A;  . INDUCED ABORTION  2009  . VAGINAL DELIVERY  2007     OB History    Gravida  9   Para  4   Term  3   Preterm  1   AB  5   Living  3  SAB  2   TAB  3   Ectopic      Multiple  0   Live Births  3            Home Medications    Prior to Admission medications   Medication Sig Start Date End Date Taking? Authorizing Provider  cyclobenzaprine (FLEXERIL) 10 MG tablet Take 1 tablet (10 mg total) by mouth 2 (two) times daily as needed for muscle spasms. 11/14/18   Lamptey, Britta Mccreedy, MD  lidocaine (LIDODERM) 5 % Place 1 patch onto the skin daily. Remove & Discard patch within 12 hours or as directed by MD 11/27/18   Barkley Boards, PA-C    Family History Family History  Problem Relation Age of Onset  . Depression Mother   . Anxiety disorder Mother   . Breast cancer Other   . Bipolar disorder Brother   . Diabetes Maternal Grandmother   . Cancer Maternal Grandmother   . Hypertension Maternal Grandmother   . Hypertension Paternal  Grandmother   . Autism Other   . Schizophrenia Other     Social History Social History   Tobacco Use  . Smoking status: Passive Smoke Exposure - Never Smoker  . Smokeless tobacco: Never Used  . Tobacco comment: was not a daily smoker, tried it  Substance Use Topics  . Alcohol use: No    Comment: Twice a month  . Drug use: No     Allergies   Zofran [ondansetron hcl]   Review of Systems Review of Systems  Constitutional: Negative for activity change, chills and fever.  HENT: Negative for hearing loss.   Eyes: Negative for visual disturbance.  Respiratory: Negative for shortness of breath.   Cardiovascular: Negative for chest pain.  Gastrointestinal: Negative for abdominal pain.  Genitourinary: Negative for dysuria.  Musculoskeletal: Positive for arthralgias, myalgias, neck pain and neck stiffness. Negative for back pain.  Skin: Positive for rash.  Allergic/Immunologic: Negative for immunocompromised state.  Neurological: Negative for headaches.  Psychiatric/Behavioral: Negative for confusion.     Physical Exam Updated Vital Signs BP 116/72 (BP Location: Left Arm)   Pulse 77   Temp 97.9 F (36.6 C) (Oral)   Resp 18   Ht 5\' 8"  (1.727 m)   Wt 107 kg   LMP 11/09/2018   SpO2 98%   BMI 35.88 kg/m   Physical Exam Vitals signs and nursing note reviewed.  Constitutional:      General: She is not in acute distress. HENT:     Head: Normocephalic.  Eyes:     Extraocular Movements: Extraocular movements intact.     Conjunctiva/sclera: Conjunctivae normal.     Pupils: Pupils are equal, round, and reactive to light.  Neck:     Musculoskeletal: Normal range of motion and neck supple. No neck rigidity.     Comments: Full active and passive range of motion of the neck.  She is minimally tender over C7, but appears more diffusely tender over the bilateral trapezius muscles.  No notable spasms.  No meningismus. Cardiovascular:     Rate and Rhythm: Normal rate and regular  rhythm.     Heart sounds: No murmur. No friction rub. No gallop.   Pulmonary:     Effort: Pulmonary effort is normal. No respiratory distress.     Breath sounds: No stridor. No wheezing, rhonchi or rales.  Chest:     Chest wall: No tenderness.  Abdominal:     General: There is no distension.  Palpations: Abdomen is soft. There is no mass.     Tenderness: There is no abdominal tenderness. There is no right CVA tenderness, left CVA tenderness, guarding or rebound.     Hernia: No hernia is present.  Genitourinary:    Comments: She has a single furuncle noted in the mons pubis region.  No surrounding erythema, edema, or warmth. Lymphadenopathy:     Cervical: No cervical adenopathy.  Skin:    General: Skin is warm.     Findings: No rash.  Neurological:     Mental Status: She is alert.  Psychiatric:        Behavior: Behavior normal.      ED Treatments / Results  Labs (all labs ordered are listed, but only abnormal results are displayed) Labs Reviewed  CBG MONITORING, ED    EKG None  Radiology No results found.  Procedures Procedures (including critical care time)  Medications Ordered in ED Medications  ibuprofen (ADVIL,MOTRIN) tablet 600 mg (600 mg Oral Given 11/27/18 0311)     Initial Impression / Assessment and Plan / ED Course  I have reviewed the triage vital signs and the nursing notes.  Pertinent labs & imaging results that were available during my care of the patient were reviewed by me and considered in my medical decision making (see chart for details).        33 year old female with a history of chlamydia, genital HSV, GERD, gestational diabetes, thyroid disease, trichomonas, and IBS presenting with neck pain and rash.  She was previously seen by urgent care for rash and was prescribed Valtrex.  The rash was not evaluated at that time.  On my exam today, she appears to have a single furuncle in the mons pubis region that is healing.  She states that  this is the rash that she was concerned was an outbreak of genital herpes.  Apparently, she googled her symptoms and was concerned that she had HSV virus in her spine.  It sounds as if her neck pain began after lifting at her job.  She has full active and passive range of motion.  No meningeal signs.  She also reports that she is concerned about her diabetes that she has not been checking her blood sugar at home or taking her metformin.  Per chart review, it appears as if she had gestational diabetes.  CBG today was 98.  Will discharge to home with anti-inflammatories for musculoskeletal pain.  Return precautions to the ER given.  I encouraged her to follow-up with primary care.  She is hemodynamically stable and in no acute distress.  Safe for discharge home with outpatient follow-up at this time.  Final Clinical Impressions(s) / ED Diagnoses   Final diagnoses:  Cervical myofascial strain, initial encounter  Folliculitis    ED Discharge Orders         Ordered    lidocaine (LIDODERM) 5 %  Every 24 hours     11/27/18 0245           Barkley Boards, PA-C 11/27/18 1004    Glynn Octave, MD 11/27/18 2007

## 2018-11-27 NOTE — ED Notes (Signed)
Patient verbalizes understanding of discharge instructions. Opportunity for questioning and answers were provided. Armband removed by staff, pt discharged from ED ambulatory.   

## 2018-11-27 NOTE — Discharge Instructions (Addendum)
Thank you for allowing me to care for you today in the Emergency Department.   Take 600 mg of ibuprofen with food or 650 mg of Tylenol once every 6 hours for pain control.  Apply 1 lidocaine patch onto the skin daily to help with pain.  I have attached some exercises that you can start to perform to help stretch the muscles around the cervical spine.  Your blood sugar was normal today.  Avoid pulling your hair into a tight bun as this may improve your pain.  Call the number on your discharge paperwork to get established with a primary care provider to follow-up with if your symptoms do not improve.  Return to the emergency department if you develop new numbness or weakness in your arms or legs, high fever, or rash over the neck, or other new, concerning symptoms.

## 2019-01-28 ENCOUNTER — Emergency Department (HOSPITAL_COMMUNITY)
Admission: EM | Admit: 2019-01-28 | Discharge: 2019-01-28 | Disposition: A | Discharge status: Home / Self Care | Payer: Medicaid Other | Attending: Emergency Medicine | Admitting: Emergency Medicine

## 2019-01-28 ENCOUNTER — Emergency Department (HOSPITAL_COMMUNITY): Payer: Medicaid Other

## 2019-01-28 ENCOUNTER — Encounter (HOSPITAL_COMMUNITY): Payer: Self-pay | Admitting: *Deleted

## 2019-01-28 ENCOUNTER — Other Ambulatory Visit: Payer: Self-pay

## 2019-01-28 DIAGNOSIS — Z7722 Contact with and (suspected) exposure to environmental tobacco smoke (acute) (chronic): Secondary | ICD-10-CM | POA: Diagnosis not present

## 2019-01-28 DIAGNOSIS — R51 Headache: Secondary | ICD-10-CM | POA: Insufficient documentation

## 2019-01-28 DIAGNOSIS — E079 Disorder of thyroid, unspecified: Secondary | ICD-10-CM | POA: Insufficient documentation

## 2019-01-28 DIAGNOSIS — R519 Headache, unspecified: Secondary | ICD-10-CM

## 2019-01-28 LAB — I-STAT BETA HCG BLOOD, ED (MC, WL, AP ONLY): I-stat hCG, quantitative: <5 m[IU]/mL (ref <5)

## 2019-01-28 LAB — BASIC METABOLIC PANEL
Anion gap: 9 (ref 5–15)
BUN: 14 mg/dL (ref 6–20)
CO2: 25 mmol/L (ref 22–32)
Calcium: 9.6 mg/dL (ref 8.9–10.3)
Chloride: 104 mmol/L (ref 98–111)
Creatinine, Ser: 0.69 mg/dL (ref 0.44–1.00)
GFR calc Af Amer: >60 mL/min (ref >60)
GFR calc non Af Amer: >60 mL/min (ref >60)
Glucose, Bld: 81 mg/dL (ref 70–99)
Potassium: 3.7 mmol/L (ref 3.5–5.1)
Sodium: 138 mmol/L (ref 135–145)

## 2019-01-28 LAB — CBC
HCT: 35.4 % — ABNORMAL LOW (ref 36.0–46.0)
Hemoglobin: 10.9 g/dL — ABNORMAL LOW (ref 12.0–15.0)
MCH: 23.6 pg — ABNORMAL LOW (ref 26.0–34.0)
MCHC: 30.8 g/dL (ref 30.0–36.0)
MCV: 76.8 fL — ABNORMAL LOW (ref 80.0–100.0)
Platelets: 352 10*3/uL (ref 150–400)
RBC: 4.61 MIL/uL (ref 3.87–5.11)
RDW: 16.7 % — ABNORMAL HIGH (ref 11.5–15.5)
WBC: 11.7 10*3/uL — ABNORMAL HIGH (ref 4.0–10.5)
nRBC: 0 % (ref 0.0–0.2)

## 2019-01-28 MED ORDER — SODIUM CHLORIDE 0.9 % IV BOLUS
1000.0000 mL | Freq: Once | INTRAVENOUS | Status: AC
  Administered 2019-01-28: 1000 mL via INTRAVENOUS

## 2019-01-28 MED ORDER — DIPHENHYDRAMINE HCL 50 MG/ML IJ SOLN
25.0000 mg | Freq: Once | INTRAMUSCULAR | Status: AC
  Administered 2019-01-28: 12.5 mg via INTRAVENOUS
  Filled 2019-01-28: qty 1

## 2019-01-28 MED ORDER — KETOROLAC TROMETHAMINE 30 MG/ML IJ SOLN
15.0000 mg | Freq: Once | INTRAMUSCULAR | Status: AC
  Administered 2019-01-28: 15 mg via INTRAVENOUS
  Filled 2019-01-28: qty 1

## 2019-01-28 MED ORDER — PROMETHAZINE HCL 25 MG/ML IJ SOLN
12.5000 mg | Freq: Once | INTRAMUSCULAR | Status: AC
  Administered 2019-01-28: 12.5 mg via INTRAVENOUS
  Filled 2019-01-28: qty 1

## 2019-01-28 NOTE — ED Triage Notes (Signed)
PT reports HA since  Dec. and  Pt reports her eyes look tired.

## 2019-01-28 NOTE — ED Triage Notes (Signed)
PT reports she wants a CT for her head.

## 2019-01-28 NOTE — Discharge Instructions (Addendum)
Please take Ibuprofen (Advil, motrin) and Tylenol (acetaminophen) to relieve your pain.  You may take up to 600 MG (3 pills) of normal strength ibuprofen every 8 hours as needed.  In between doses of ibuprofen you make take tylenol, up to 1,000 mg (two extra strength pills).  Do not take more than 3,000 mg tylenol in a 24 hour period.  Please check all medication labels as many medications such as pain and cold medications may contain tylenol.  Do not drink alcohol while taking these medications.  Do not take other NSAID'S while taking ibuprofen (such as aleve or naproxen).  Please take ibuprofen with food to decrease stomach upset.  Today your blood work showed that you are anemic.  Please follow this up with your primary care doctor.  I offered you medicines for your headache which you declined.  Please follow-up with your primary care doctor.

## 2019-01-28 NOTE — ED Provider Notes (Signed)
[facility] EMERGENCY DEPARTMENT Provider Note   CSN: [id number] Arrival date & time: 01/28/19  1819    History   Chief Complaint Chief Complaint  Patient presents with  . Headache    HPI Melinda Jimenez is a 33 y.o. female with a past medical history of thyroid disease, anxiety, who presents today for evaluation of headache.  She reports that she has had headaches since December.  Her headache initially was in the back of her head and neck however is now mostly in the front and top of her head.  She says that it has gotten worse in the past 6 days.  She reports phonophobia, no photophobia.  She denies any current neck pain or fevers.  Her vision is blurry as she says she needs glasses but does not wear them however she denies any vision changes.  She was seen on 2/26 and diagnosed with cervical myofascial strain.  She was given a prescription for muscle relaxers.  She reports that she took 1 of them and it "knocked me out for 4 days, and made me feel like my soul left my body."  She denies any trauma.    She is requesting CT scan of her head.         HPI  Past Medical History:  Diagnosis Date  . Anemia   . Anxiety    off meds with preg  . Carpal tunnel syndrome   . Chlamydia   . Genital HSV    Last OB: > 69yr ago; Valtrex 500 mg po prn outbreaks  . GERD (gastroesophageal reflux disease)   . Gestational diabetes    diet controlled  . Headache(784.0)   . IBS (irritable bowel syndrome)   . Infection    UTI  . Mood swings   . Panic attack   . Thyroid disease   . Trichomonas     Patient Active Problem List   Diagnosis Date Noted  . Postpartum care following cesarean delivery 04/06/2017  . H/O: cesarean section 09/27/2016  . GERD without esophagitis 01/20/2013  . Anxiety and depression 01/20/2013  . CARPAL TUNNEL SYNDROME 06/02/2010  . Vitamin D deficiency 04/19/2010  . Herpes genitalis in women 04/18/2010  . Goiter, unspecified 04/18/2010     Past Surgical History:  Procedure Laterality Date  . CESAREAN SECTION N/A 05/20/2013   Procedure: CESAREAN SECTION;  Surgeon: Brock Bad, MD;  Location: [facility];  Service: Obstetrics;  Laterality: N/A;  . CESAREAN SECTION N/A 04/06/2017   Procedure: CESAREAN SECTION;  Surgeon: Hermina Staggers, MD;  Location: [facility];  Service: Obstetrics;  Laterality: N/A;  . INDUCED ABORTION  2009  . VAGINAL DELIVERY  2007     OB History    Gravida  9   Para  4   Term  3   Preterm  1   AB  5   Living  3     SAB  2   TAB  3   Ectopic      Multiple  0   Live Births  3            Home Medications    Prior to Admission medications   Medication Sig Start Date End Date Taking? Authorizing Provider  cyclobenzaprine (FLEXERIL) 10 MG tablet Take 1 tablet (10 mg total) by mouth 2 (two) times daily as needed for muscle spasms. Patient not taking: Reported on 01/28/2019 11/14/18   Merrilee Jansky, MD  lidocaine (LIDODERM)  5 % Place 1 patch onto the skin daily. Remove & Discard patch within 12 hours or as directed by MD Patient not taking: Reported on 01/28/2019 11/27/18   Barkley Boards, PA-C    Family History Family History  Problem Relation Age of Onset  . Depression Mother   . Anxiety disorder Mother   . Breast cancer Other   . Bipolar disorder Brother   . Diabetes Maternal Grandmother   . Cancer Maternal Grandmother   . Hypertension Maternal Grandmother   . Hypertension Paternal Grandmother   . Autism Other   . Schizophrenia Other     Social History Social History   Tobacco Use  . Smoking status: Passive Smoke Exposure - Never Smoker  . Smokeless tobacco: Never Used  . Tobacco comment: was not a daily smoker, tried it  Substance Use Topics  . Alcohol use: No    Comment: Twice a month  . Drug use: No     Allergies   Zofran [ondansetron hcl]   Review of Systems Review of Systems  Constitutional: Negative for chills and fever.  HENT: Negative  for congestion.   Eyes: Negative for photophobia and visual disturbance.  Respiratory: Negative for shortness of breath.   Cardiovascular: Negative for chest pain.  Gastrointestinal: Negative for abdominal pain.  Genitourinary: Negative for difficulty urinating.  Musculoskeletal: Negative for back pain, neck pain and neck stiffness.  Neurological: Positive for headaches. Negative for dizziness, facial asymmetry, speech difficulty and weakness.  Psychiatric/Behavioral: Negative for confusion.  All other systems reviewed and are negative.    Physical Exam Updated Vital Signs BP 115/78   Pulse 91   Temp 97.9 F (36.6 C) (Oral)   Ht  (1.702 m)   Wt 107 kg   LMP 01/21/2019   SpO2 98%   BMI 36.96 kg/m   Physical Exam Vitals signs and nursing note reviewed.  Constitutional:      General: She is not in acute distress.    Appearance: She is well-developed. She is not ill-appearing.  HENT:     Head: Normocephalic and atraumatic.     Mouth/Throat:     Mouth: Mucous membranes are moist.  Eyes:     General: No visual field deficit.    Extraocular Movements: Extraocular movements intact.     Conjunctiva/sclera: Conjunctivae normal.     Pupils: Pupils are equal, round, and reactive to light.  Neck:     Musculoskeletal: Normal range of motion and neck supple. No neck rigidity.  Cardiovascular:     Rate and Rhythm: Normal rate and regular rhythm.     Heart sounds: No murmur.  Pulmonary:     Effort: Pulmonary effort is normal. No respiratory distress.     Breath sounds: Normal breath sounds.  Abdominal:     Palpations: Abdomen is soft.     Tenderness: There is no abdominal tenderness.  Musculoskeletal: Normal range of motion.        General: No swelling.  Skin:    General: Skin is warm and dry.  Neurological:     Mental Status: She is alert and oriented to person, place, and time.     GCS: GCS eye subscore is 4. GCS verbal subscore is 5. GCS motor subscore is 6.      Cranial Nerves: No cranial nerve deficit, dysarthria or facial asymmetry.     Sensory: No sensory deficit.     Motor: No weakness.     Gait: Gait normal.  Psychiatric:  Mood and Affect: Mood normal.        Behavior: Behavior normal.      ED Treatments / Results  Labs (all labs ordered are listed, but only abnormal results are displayed) Labs Reviewed  CBC - Abnormal; Notable for the following components:      Result Value   WBC 11.7 (*)    Hemoglobin 10.9 (*)    HCT 35.4 (*)    MCV 76.8 (*)    MCH 23.6 (*)    RDW 16.7 (*)    All other components within normal limits  BASIC METABOLIC PANEL  I-STAT BETA HCG BLOOD, ED ([facility], [facility], [facility] ONLY)    EKG None  Radiology Ct Head Wo Contrast  Result Date: 01/28/2019 CLINICAL DATA:  Chronic headache for several months. EXAM: CT HEAD WITHOUT CONTRAST TECHNIQUE: Contiguous axial images were obtained from the base of the skull through the vertex without intravenous contrast. COMPARISON:  10/20/2011 FINDINGS: Brain: No evidence of acute infarction, hemorrhage, hydrocephalus, extra-axial collection, or mass lesion/mass effect. Vascular:  No hyperdense vessel or other acute findings. Skull: No evidence of fracture or other significant bone abnormality. Sinuses/Orbits:  No acute findings. Other: None. IMPRESSION: Negative noncontrast head CT. Electronically Signed   By: Myles RosenthalJohn  Stahl M.D.   On: 01/28/2019 19:13    Procedures Procedures (including critical care time)  Medications Ordered in ED Medications  diphenhydrAMINE (BENADRYL) injection 25 mg (has no administration in time range)  ketorolac (TORADOL) 30 MG/ML injection 15 mg (has no administration in time range)  sodium chloride 0.9 % bolus 1,000 mL (1,000 mLs Intravenous New Bag/Given 01/28/19 2156)  promethazine (PHENERGAN) injection 12.5 mg (12.5 mg Intravenous Given 01/28/19 2157)     Initial Impression / Assessment and Plan / ED Course  I have reviewed the triage vital signs and  the nursing notes.  Pertinent labs & imaging results that were available during my care of the patient were reviewed by me and considered in my medical decision making (see chart for details).  Clinical Course as of Jan 27 2158  Wed Jan 28, 2019  2022 Was informed that patient wanted to speak to me prior to having migraine cocktail.  I spoke with patient, and discussed her results.  She expressed anxiety that the medications may make her feel drowsy and sleepy, frequently stating "I am spiritual" and that she did not like when she feels sleepy.  She agreed to medications.    [EH]  2133 Was informed that patient is now refusing an IV and wants to be discharged home.  Patient discharged.   [EH]    Clinical Course User Index [EH] Cristina GongHammond, Riad Wagley W, PA-C      Patient presents today requesting evaluation of a headache.  Her headache has been present since December however is worsened over the past 6 days.  She requested a CT scan of her head.  Given that this is a very long-term headache with a sudden worsening feel CT scan is reasonable to evaluate for masses.  CT scan without evidence of acute abnormality.  Labs are obtained and reviewed, she is anemic, suspect that this is chronic, she is instructed to follow-up with her primary care doctor.  Ordered migraine cocktail, patient initially agreed to this, however changed her mind multiple times, eventually refusing.  She is not given instructions on ibuprofen and Tylenol.  Recommended PCP follow-up.  I suspect a tension headache as the primary cause for her pain.  Return precautions were discussed with patient  who states their understanding.  At the time of discharge patient denied any unaddressed complaints or concerns.  Patient is agreeable for discharge home.    Final Clinical Impressions(s) / ED Diagnoses   Final diagnoses:  Acute nonintractable headache, unspecified headache type    ED Discharge Orders    None       Norman Clay 01/28/19 2159    Gerhard Munch, MD 01/30/19 [phone]

## 2019-06-16 ENCOUNTER — Other Ambulatory Visit: Payer: Self-pay

## 2019-06-16 DIAGNOSIS — Z20822 Contact with and (suspected) exposure to covid-19: Secondary | ICD-10-CM

## 2019-06-18 LAB — NOVEL CORONAVIRUS, NAA: SARS-CoV-2, NAA: NOT DETECTED

## 2019-12-07 ENCOUNTER — Other Ambulatory Visit: Payer: Self-pay

## 2019-12-07 ENCOUNTER — Emergency Department (HOSPITAL_COMMUNITY)
Admission: EM | Admit: 2019-12-07 | Discharge: 2019-12-07 | Disposition: A | Discharge status: Home / Self Care | Payer: Medicaid Other | Attending: Emergency Medicine | Admitting: Emergency Medicine

## 2019-12-07 DIAGNOSIS — Z7722 Contact with and (suspected) exposure to environmental tobacco smoke (acute) (chronic): Secondary | ICD-10-CM | POA: Diagnosis not present

## 2019-12-07 DIAGNOSIS — K5900 Constipation, unspecified: Secondary | ICD-10-CM | POA: Diagnosis not present

## 2019-12-07 DIAGNOSIS — R519 Headache, unspecified: Secondary | ICD-10-CM | POA: Diagnosis not present

## 2019-12-07 DIAGNOSIS — J302 Other seasonal allergic rhinitis: Secondary | ICD-10-CM | POA: Diagnosis not present

## 2019-12-07 DIAGNOSIS — Z760 Encounter for issue of repeat prescription: Secondary | ICD-10-CM | POA: Diagnosis not present

## 2019-12-07 DIAGNOSIS — J3489 Other specified disorders of nose and nasal sinuses: Secondary | ICD-10-CM | POA: Diagnosis not present

## 2019-12-07 LAB — BASIC METABOLIC PANEL
Anion gap: 8 (ref 5–15)
BUN: 10 mg/dL (ref 6–20)
CO2: 25 mmol/L (ref 22–32)
Calcium: 9.2 mg/dL (ref 8.9–10.3)
Chloride: 105 mmol/L (ref 98–111)
Creatinine, Ser: 0.66 mg/dL (ref 0.44–1.00)
GFR calc Af Amer: >60 mL/min (ref >60)
GFR calc non Af Amer: >60 mL/min (ref >60)
Glucose, Bld: 120 mg/dL — ABNORMAL HIGH (ref 70–99)
Potassium: 4 mmol/L (ref 3.5–5.1)
Sodium: 138 mmol/L (ref 135–145)

## 2019-12-07 LAB — CBC WITH DIFFERENTIAL/PLATELET
Abs Immature Granulocytes: 0.02 10*3/uL (ref 0.00–0.07)
Basophils Absolute: 0 10*3/uL (ref 0.0–0.1)
Basophils Relative: 0 %
Eosinophils Absolute: 0.1 10*3/uL (ref 0.0–0.5)
Eosinophils Relative: 1 %
HCT: 37.7 % (ref 36.0–46.0)
Hemoglobin: 11.4 g/dL — ABNORMAL LOW (ref 12.0–15.0)
Immature Granulocytes: 0 %
Lymphocytes Relative: 44 %
Lymphs Abs: 3.4 10*3/uL (ref 0.7–4.0)
MCH: 23.3 pg — ABNORMAL LOW (ref 26.0–34.0)
MCHC: 30.2 g/dL (ref 30.0–36.0)
MCV: 76.9 fL — ABNORMAL LOW (ref 80.0–100.0)
Monocytes Absolute: 0.5 10*3/uL (ref 0.1–1.0)
Monocytes Relative: 6 %
Neutro Abs: 3.9 10*3/uL (ref 1.7–7.7)
Neutrophils Relative %: 49 %
Platelets: 363 10*3/uL (ref 150–400)
RBC: 4.9 MIL/uL (ref 3.87–5.11)
RDW: 16.7 % — ABNORMAL HIGH (ref 11.5–15.5)
WBC: 7.9 10*3/uL (ref 4.0–10.5)
nRBC: 0 % (ref 0.0–0.2)

## 2019-12-07 LAB — I-STAT BETA HCG BLOOD, ED (MC, WL, AP ONLY): I-stat hCG, quantitative: <5 m[IU]/mL (ref <5)

## 2019-12-07 MED ORDER — DOCUSATE SODIUM 100 MG PO CAPS: 100.0000 mg | ORAL_CAPSULE | Freq: Two times a day (BID) | ORAL | 0 refills | Status: DC

## 2019-12-07 MED ORDER — FLUTICASONE PROPIONATE 50 MCG/ACT NA SUSP: 1.0000 | Freq: Every day | NASAL | 0 refills | Status: DC

## 2019-12-07 MED ORDER — CETIRIZINE HCL 10 MG PO TABS: 10.0000 mg | ORAL_TABLET | Freq: Every day | ORAL | 0 refills | Status: DC

## 2019-12-07 MED ORDER — SODIUM CHLORIDE 0.9 % IV BOLUS
1000.0000 mL | Freq: Once | INTRAVENOUS | Status: AC
  Administered 2019-12-07: 1000 mL via INTRAVENOUS

## 2019-12-07 MED ORDER — VALACYCLOVIR HCL 1 G PO TABS: 1000.0000 mg | ORAL_TABLET | Freq: Two times a day (BID) | ORAL | 0 refills | Status: AC

## 2019-12-07 NOTE — Discharge Instructions (Signed)
Take the medications as prescribed  Return for new or worsening symptoms 

## 2019-12-07 NOTE — ED Notes (Signed)
Patient verbalizes understanding of discharge instructions. Opportunity for questioning and answers were provided. Armband removed by staff, pt discharged from ED. Ambulated out to lobby  

## 2019-12-07 NOTE — ED Provider Notes (Signed)
[facility] EMERGENCY DEPARTMENT Provider Note   CSN: [id number] Arrival date & time: 12/07/19  1004    History Multiple complaints   Melinda Jimenez is a 34 y.o. female with past medical history significant for anxiety, chronic headache, thyroid disease, anemia who presents for evaluation of multiple complaints  Patient with intermittent headaches.  Has history of chronic headaches.  States similar headaches in the past.  Prior head CT imaging negative.  No recent injury or trauma.  No sudden onset thunderclap headache, photophobia, phonophobia, neck pain or neck stiffness.  No pain currently states she will get associated nausea and vomiting however none now.  Patient also with intermittent constipation at home.  History of IBS.  Is having to take laxatives at home to have a bowel movement.  Feels like she is dehydrated. States when she does little p.o. intake her constipation worsens.  No fever, chills, abdominal pain, pelvic pain, vaginal discharge, rashes or lesions.  No melena or bright red blood per rectum.  Last BM yesterday.  Patient also needing medication refill for herpes. Does not have PCP follow-up.  Does not have active outbreak currently, per patient. Requesting medication for future outbreak.  Patient also has some erythematous and some rhinorrhea.  Recent swimming activities.  No known Covid exposures.  Patient also with abnormal menstrual cycles x years. Denies chance of pregnancy.   History obtained from patient and past medical records.  No interpreter is used.  HPI     Past Medical History:  Diagnosis Date  . Anemia   . Anxiety    off meds with preg  . Carpal tunnel syndrome   . Chlamydia   . Genital HSV    Last OB: > 61yr ago; Valtrex 500 mg po prn outbreaks  . GERD (gastroesophageal reflux disease)   . Gestational diabetes    diet controlled  . Headache(784.0)   . IBS (irritable bowel syndrome)   . Infection    UTI  . Mood swings   .  Panic attack   . Thyroid disease   . Trichomonas     Patient Active Problem List   Diagnosis Date Noted  . Postpartum care following cesarean delivery 04/06/2017  . H/O: cesarean section 09/27/2016  . GERD without esophagitis 01/20/2013  . Anxiety and depression 01/20/2013  . CARPAL TUNNEL SYNDROME 06/02/2010  . Vitamin D deficiency 04/19/2010  . Herpes genitalis in women 04/18/2010  . Goiter, unspecified 04/18/2010    Past Surgical History:  Procedure Laterality Date  . CESAREAN SECTION N/A 05/20/2013   Procedure: CESAREAN SECTION;  Surgeon: Shelly Bombard, MD;  Location: [place] [facility];  Service: Obstetrics;  Laterality: N/A;  . CESAREAN SECTION N/A 04/06/2017   Procedure: CESAREAN SECTION;  Surgeon: Chancy Milroy, MD;  Location: [place];  Service: Obstetrics;  Laterality: N/A;  . INDUCED ABORTION  2009  . VAGINAL DELIVERY  2007     OB History    Gravida  9   Para  4   Term  3   Preterm  1   AB  5   Living  3     SAB  2   TAB  3   Ectopic      Multiple  0   Live Births  3           Family History  Problem Relation Age of Onset  . Depression Mother   . Anxiety disorder Mother   . Breast cancer Other   .  Bipolar disorder Brother   . Diabetes Maternal Grandmother   . Cancer Maternal Grandmother   . Hypertension Maternal Grandmother   . Hypertension Paternal Grandmother   . Autism Other   . Schizophrenia Other     Social History   Tobacco Use  . Smoking status: Passive Smoke Exposure - Never Smoker  . Smokeless tobacco: Never Used  . Tobacco comment: was not a daily smoker, tried it  Substance Use Topics  . Alcohol use: No    Comment: Twice a month  . Drug use: No    Home Medications Prior to Admission medications   Medication Sig Start Date End Date Taking? Authorizing Provider  cetirizine (ZYRTEC ALLERGY) 10 MG tablet Take 1 tablet (10 mg total) by mouth daily. 12/07/19   Cearra Portnoy A, PA-C  cyclobenzaprine (FLEXERIL) 10  MG tablet Take 1 tablet (10 mg total) by mouth 2 (two) times daily as needed for muscle spasms. Patient not taking: Reported on 01/28/2019 11/14/18   Merrilee Jansky, MD  docusate sodium (COLACE) 100 MG capsule Take 1 capsule (100 mg total) by mouth every 12 (twelve) hours. 12/07/19   Linnet Bottari A, PA-C  fluticasone (FLONASE) 50 MCG/ACT nasal spray Place 1 spray into both nostrils daily. 12/07/19   Yamir Carignan A, PA-C  lidocaine (LIDODERM) 5 % Place 1 patch onto the skin daily. Remove & Discard patch within 12 hours or as directed by MD Patient not taking: Reported on 01/28/2019 11/27/18   McDonald, Mia A, PA-C  valACYclovir (VALTREX) 1000 MG tablet Take 1 tablet (1,000 mg total) by mouth 2 (two) times daily for 10 days. 12/07/19 12/17/19  Makenli Derstine A, PA-C    Allergies    Zofran [ondansetron hcl]  Review of Systems   Review of Systems  Constitutional: Negative.   HENT: Positive for postnasal drip and rhinorrhea. Negative for dental problem, drooling, ear discharge, ear pain, sinus pressure, sinus pain, sore throat, trouble swallowing and voice change.   Eyes: Negative.   Respiratory: Negative.   Cardiovascular: Negative.   Gastrointestinal: Positive for constipation (Chronic), nausea and vomiting (With HA, none currently). Negative for abdominal distention, abdominal pain, blood in stool, diarrhea and rectal pain.  Neurological: Positive for headaches (Chronic, None currently).  All other systems reviewed and are negative.  Physical Exam Updated Vital Signs BP (!) 130/99   Pulse 72   Temp 98.3 F (36.8 C) (Oral)   Resp 16   SpO2 100%   Physical Exam Vitals and nursing note reviewed.  Constitutional:      General: She is not in acute distress.    Appearance: She is well-developed. She is not ill-appearing, toxic-appearing or diaphoretic.  HENT:     Head: Normocephalic and atraumatic.     Jaw: There is normal jaw occlusion.     Ears:     Comments: Clear fluid behind  both TM without bulging, erythema Eyes:     Pupils: Pupils are equal, round, and reactive to light.     Comments: No horizontal, vertical or rotational nystagmus   Neck:     Comments: Full active and passive ROM without pain No midline or paraspinal tenderness No nuchal rigidity or meningeal signs  Cardiovascular:     Rate and Rhythm: Normal rate.  Pulmonary:     Effort: No respiratory distress.  Abdominal:     General: There is no distension.  Musculoskeletal:        General: Normal range of motion.     Cervical  back: Normal range of motion and neck supple.  Skin:    General: Skin is warm and dry.  Neurological:     Mental Status: She is alert.     Comments: Mental Status:  Alert, oriented, thought content appropriate. Speech fluent without evidence of aphasia. Able to follow 2 step commands without difficulty.  Cranial Nerves:  II:  Peripheral visual fields grossly normal, pupils equal, round, reactive to light III,IV, VI: ptosis not present, extra-ocular motions intact bilaterally  V,VII: smile symmetric, facial light touch sensation equal VIII: hearing grossly normal bilaterally  IX,X: midline uvula rise  XI: bilateral shoulder shrug equal and strong XII: midline tongue extension  Motor:  5/5 in upper and lower extremities bilaterally including strong and equal grip strength and dorsiflexion/plantar flexion Sensory: Pinprick and light touch normal in all extremities.  Deep Tendon Reflexes: 2+ and symmetric  Cerebellar: normal finger-to-nose with bilateral upper extremities Gait: normal gait and balance CV: distal pulses palpable throughout      Physical Exam  Constitutional: Pt is oriented to person, place, and time. Pt appears well-developed and well-nourished. No distress.  HENT:  Head: Normocephalic and atraumatic.  Mouth/Throat: Oropharynx is clear and moist.  Eyes: Conjunctivae and EOM are normal. Pupils are equal, round, and reactive to light. No scleral  icterus.      ED Results / Procedures / Treatments   Labs (all labs ordered are listed, but only abnormal results are displayed) Labs Reviewed  CBC WITH DIFFERENTIAL/PLATELET - Abnormal; Notable for the following components:      Result Value   Hemoglobin 11.4 (*)    MCV 76.9 (*)    MCH 23.3 (*)    RDW 16.7 (*)    All other components within normal limits  BASIC METABOLIC PANEL - Abnormal; Notable for the following components:   Glucose, Bld 120 (*)    All other components within normal limits  I-STAT BETA HCG BLOOD, ED ([facility], [facility], [facility] ONLY)    EKG None  Radiology No results found.  Procedures Procedures (including critical care time)  Medications Ordered in ED Medications  sodium chloride 0.9 % bolus 1,000 mL (0 mLs Intravenous Stopped 12/07/19 1143)    ED Course  I have reviewed the triage vital signs and the nursing notes.  Pertinent labs & imaging results that were available during my care of the patient were reviewed by me and considered in my medical decision making (see chart for details).  43 old female appears otherwise well presents for evaluation of multiple complaints.  She is afebrile, nonseptic, non-ill-appearing.  1-Patient with intermittent headaches.  Has history of chronic headache.  Feels similar.  She has a nonfocal neuro exam without deficits.  No recent injury or trauma.  No sudden onset thunderclap headache.  No meningeal signs.  Prior head CT 1 year ago without any significant findings.  No current headache here.  Discussed help with PCP for possible prophylactic medicine given she states she has had frequent headaches. Presentation non concerning for [facility], ICH, Meningitis, or temporal arteritis. Pt is afebrile with no focal neuro deficits, nuchal rigidity, or change in vision.  2-Patient also with constipation.  Last bowel movement yesterday.  Benign abdominal exam without any evidence of surgical abdomen.  Urinating without difficulty.  History of IBS  with constipation.  No melena or bright red blood per rectum.  Low suspicion for bowel obstruction, perforation, intra-abdominal infection.  Discussed  increased fluids at home as well as stool softener and follow-up with  PCP.  3-Patient with intermittent menstrual cycles x years.  Pregnancy test negative.  No concern for STDs.  She is requesting refill of her herpes medication however denies any current outbreak now.  Patient symptoms seem to be chronic in nature.  She does need good follow-up with PCP.  I will refill her acyclovir and give her resources for PCP follow up.  Discussed strict return precautions.  The patient has been appropriately medically screened and/or stabilized in the ED. I have low suspicion for any other emergent medical condition which would require further screening, evaluation or treatment in the ED or require inpatient management.  Patient is hemodynamically stable and in no acute distress.  Patient able to ambulate in department prior to ED.  Evaluation does not show acute pathology that would require ongoing or additional emergent interventions while in the emergency department or further inpatient treatment.  I have discussed the diagnosis with the patient and answered all questions.  Pain is been managed while in the emergency department and patient has no further complaints prior to discharge.  Patient is comfortable with plan discussed in room and is stable for discharge at this time.  I have discussed strict return precautions for returning to the emergency department.  Patient was encouraged to follow-up with PCP/specialist refer to at discharge.     MDM Rules/Calculators/A&P                      Final Clinical Impression(s) / ED Diagnoses Final diagnoses:  Acute nonintractable headache, unspecified headache type  Rhinorrhea  Seasonal allergies  Constipation, unspecified constipation type  Encounter for medication refill    Rx / DC Orders ED Discharge  Orders         Ordered    cetirizine (ZYRTEC ALLERGY) 10 MG tablet  Daily     12/07/19 1225    fluticasone (FLONASE) 50 MCG/ACT nasal spray  Daily     12/07/19 1225    docusate sodium (COLACE) 100 MG capsule  Every 12 hours     12/07/19 1225    valACYclovir (VALTREX) 1000 MG tablet  2 times daily     12/07/19 1227           Aaren Atallah A, PA-C 12/07/19 1230    Margarita Grizzle, MD 12/07/19 1906

## 2019-12-07 NOTE — ED Triage Notes (Signed)
Pt here for multiple complaints-of headache, pain in ears that feels like fluid, sometimes has confusion and neck pain with her headaches, and constipation x 3 months. Taking over the counter medication without relief. Pt sts she is having to take laxatives to help her have bowel movements. Occasionally will throw up blood, last time this morning. Also concerned because she's having irregular periods.

## 2019-12-29 ENCOUNTER — Ambulatory Visit (INDEPENDENT_AMBULATORY_CARE_PROVIDER_SITE_OTHER): Payer: Medicaid Other | Admitting: Internal Medicine

## 2019-12-29 ENCOUNTER — Encounter: Payer: Self-pay | Admitting: Internal Medicine

## 2019-12-29 DIAGNOSIS — H6983 Other specified disorders of Eustachian tube, bilateral: Secondary | ICD-10-CM

## 2019-12-29 DIAGNOSIS — E669 Obesity, unspecified: Secondary | ICD-10-CM

## 2019-12-29 DIAGNOSIS — Z7689 Persons encountering health services in other specified circumstances: Secondary | ICD-10-CM

## 2019-12-29 DIAGNOSIS — A6009 Herpesviral infection of other urogenital tract: Secondary | ICD-10-CM | POA: Diagnosis not present

## 2019-12-29 DIAGNOSIS — Z6836 Body mass index (BMI) 36.0-36.9, adult: Secondary | ICD-10-CM

## 2019-12-29 MED ORDER — PSEUDOEPHEDRINE HCL ER 120 MG PO TB12: 120.0000 mg | ORAL_TABLET | Freq: Two times a day (BID) | ORAL | 0 refills | Status: DC

## 2019-12-29 MED ORDER — ORLISTAT 60 MG PO CAPS: 60.0000 mg | ORAL_CAPSULE | Freq: Three times a day (TID) | ORAL | 1 refills | Status: DC

## 2019-12-29 MED ORDER — VALACYCLOVIR HCL 1 G PO TABS: 1000.0000 mg | ORAL_TABLET | Freq: Two times a day (BID) | ORAL | 2 refills | Status: DC

## 2019-12-29 NOTE — Patient Instructions (Signed)
Thank you for choosing Primary Care at [facility] to be your medical home!    Melinda Jimenez was seen by De Hollingshead, DO today.   Melinda Jimenez's primary care provider is Marcy Siren, DO.   For the best care possible, you should try to see Marcy Siren, DO whenever you come to the clinic.   We look forward to seeing you again soon!  If you have any questions about your visit today, please call us at [phone] or feel free to reach your primary care provider via MyChart.

## 2019-12-29 NOTE — Progress Notes (Signed)
Virtual Visit via Telephone Note  I connected with Melinda Jimenez, on 12/29/2019 at 10:47 AM by telephone due to the COVID-19 pandemic and verified that I am speaking with the correct person using two identifiers.   Consent: I discussed the limitations, risks, security and privacy concerns of performing an evaluation and management service by telephone and the availability of in person appointments. I also discussed with the patient that there may be a patient responsible charge related to this service. The patient expressed understanding and agreed to proceed.   Location of Patient: Home    Location of Provider: Clinic    Persons participating in Telemedicine visit: Xitlali Kastens [facility] Dr. Juleen China      History of Present Illness: Patient has a visit to establish care.  Patient went to ED on 3/8 for headaches, constipation, and other variety of complaints. See below for A/P. Patient denies concerns about headaches and constipation currently. However, does have concerns about weight and ears.    1-Patient with intermittent headaches.  Has history of chronic headache.  Feels similar.  She has a nonfocal neuro exam without deficits.  No recent injury or trauma.  No sudden onset thunderclap headache.  No meningeal signs.  Prior head CT 1 year ago without any significant findings.  No current headache here.  Discussed help with PCP for possible prophylactic medicine given she states she has had frequent headaches. Presentation non concerning for [facility], ICH, Meningitis, or temporal arteritis. Pt is afebrile with no focal neuro deficits, nuchal rigidity, or change in vision.  2-Patient also with constipation.  Last bowel movement yesterday.  Benign abdominal exam without any evidence of surgical abdomen.  Urinating without difficulty.  History of IBS with constipation.  No melena or bright red blood per rectum.  Low suspicion for bowel obstruction, perforation, intra-abdominal  infection.  Discussed  increased fluids at home as well as stool softener and follow-up with PCP.  3-Patient with intermittent menstrual cycles x years.  Pregnancy test negative.  No concern for STDs.  She is requesting refill of her herpes medication however denies any current outbreak now.  Patient symptoms seem to be chronic in nature.  She does need good follow-up with PCP.  I will refill her acyclovir and give her resources for PCP follow up.  Discussed strict return precautions.  Ear Pain: Reports she feels like she has fluid in her ears, bilaterally. They pop a lot and has muffled hearing. Has been occurring for 2-3 months. She is not using the Flonase because doesn't like putting stuff up her ears. She is taking Zyrtec. No fevers, rhinorrhea, sore throat. She has tried Tylenol and Motrin without relief.    Past Medical History:  Diagnosis Date  . Anemia   . Anxiety    off meds with preg  . Carpal tunnel syndrome   . Chlamydia   . Genital HSV    Last OB: > 68yr ago; Valtrex 500 mg po prn outbreaks  . GERD (gastroesophageal reflux disease)   . Gestational diabetes    diet controlled  . Headache(784.0)   . IBS (irritable bowel syndrome)   . Infection    UTI  . Mood swings   . Panic attack   . Thyroid disease   . Trichomonas    Allergies  Allergen Reactions  . Zofran [Ondansetron Hcl] Nausea And Vomiting    Current Outpatient Medications on File Prior to Visit  Medication Sig Dispense Refill  . cetirizine (ZYRTEC ALLERGY) 10  MG tablet Take 1 tablet (10 mg total) by mouth daily. 30 tablet 0  . docusate sodium (COLACE) 100 MG capsule Take 1 capsule (100 mg total) by mouth every 12 (twelve) hours. 60 capsule 0  . fluticasone (FLONASE) 50 MCG/ACT nasal spray Place 1 spray into both nostrils daily. 11.1 mL 0   No current facility-administered medications on file prior to visit.    Observations/Objective: NAD. Speaking clearly.  Work of breathing normal.  Alert and  oriented. Mood appropriate.   Assessment and Plan: 1. Encounter to establish care  2. Dysfunction of both eustachian tubes Symptoms sound consistent with eustachian tube dysfunction. Patient wishes not to use intranasal corticosteroid. Have recommended Sudafed for symptoms. If not improved with that, will need to evaluate further.  - pseudoephedrine (SUDAFED 12 HOUR) 120 MG 12 hr tablet; Take 1 tablet (120 mg total) by mouth 2 (two) times daily.  Dispense: 30 tablet; Refill: 0  3. Class 2 obesity without serious comorbidity with body mass index (BMI) of 36.0 to 36.9 in adult, unspecified obesity type Patient wishes to try medication for weight loss. Discussed that typically not used for long term achievement of weight goals and would recommend diet/exercise changes as first thing therapy. Counseled on potential side effects of Orlistat. Patient wishes to proceed with medication.  - orlistat (ALLI) 60 MG capsule; Take 1 capsule (60 mg total) by mouth 3 (three) times daily with meals.  Dispense: 90 capsule; Refill: 1  4. Herpes genitalis in women - valACYclovir (VALTREX) 1000 MG tablet; Take 1 tablet (1,000 mg total) by mouth 2 (two) times daily.  Dispense: 60 tablet; Refill: 2   Follow Up Instructions: Annual exam with fasting labs and PAP    I discussed the assessment and treatment plan with the patient. The patient was provided an opportunity to ask questions and all were answered. The patient agreed with the plan and demonstrated an understanding of the instructions.   The patient was advised to call back or seek an in-person evaluation if the symptoms worsen or if the condition fails to improve as anticipated.     I provided 20 minutes total of non-face-to-face time during this encounter including median intraservice time, reviewing previous notes, investigations, ordering medications, medical decision making, coordinating care and patient verbalized understanding at the end of the  visit.    Marcy Siren, D.O. Primary Care at [facility]  12/29/2019, 10:47 AM

## 2020-01-01 ENCOUNTER — Encounter (HOSPITAL_COMMUNITY): Payer: Self-pay | Admitting: Emergency Medicine

## 2020-01-01 ENCOUNTER — Other Ambulatory Visit: Payer: Self-pay

## 2020-01-01 ENCOUNTER — Emergency Department (HOSPITAL_COMMUNITY)
Admission: EM | Admit: 2020-01-01 | Discharge: 2020-01-01 | Disposition: A | Discharge status: Home / Self Care | Payer: Medicaid Other | Attending: Emergency Medicine | Admitting: Emergency Medicine

## 2020-01-01 ENCOUNTER — Emergency Department (HOSPITAL_COMMUNITY): Payer: Medicaid Other

## 2020-01-01 DIAGNOSIS — R519 Headache, unspecified: Secondary | ICD-10-CM | POA: Insufficient documentation

## 2020-01-01 DIAGNOSIS — F518 Other sleep disorders not due to a substance or known physiological condition: Secondary | ICD-10-CM | POA: Insufficient documentation

## 2020-01-01 DIAGNOSIS — Z7722 Contact with and (suspected) exposure to environmental tobacco smoke (acute) (chronic): Secondary | ICD-10-CM | POA: Insufficient documentation

## 2020-01-01 DIAGNOSIS — E079 Disorder of thyroid, unspecified: Secondary | ICD-10-CM | POA: Insufficient documentation

## 2020-01-01 DIAGNOSIS — Z91040 Latex allergy status: Secondary | ICD-10-CM | POA: Diagnosis not present

## 2020-01-01 DIAGNOSIS — Z202 Contact with and (suspected) exposure to infections with a predominantly sexual mode of transmission: Secondary | ICD-10-CM | POA: Diagnosis not present

## 2020-01-01 LAB — RAPID HIV SCREEN (HIV 1/2 AB+AG)
HIV 1/2 Antibodies: NONREACTIVE
HIV-1 P24 Antigen - HIV24: NONREACTIVE

## 2020-01-01 NOTE — ED Triage Notes (Addendum)
Pt c/o multiple problems. She states she has been here a few times for the same: c/o headache, states she feels like she has "swelling and fluid in her brain." pt states that she thinks she had a seizure 4 days ago, stating "I was standing up when my teeth just clenched together and I started shaking." pt also reports she has "stopped breathing in my sleep a few times." she c/o pain to her spine and neck as well. Denies any recent falls. A/ox4, resp e/u, nad.  Pt also added, I would like to be tested for HIV because I had a partner last year who was HIV positive and I have cold sweats at night.

## 2020-01-01 NOTE — ED Provider Notes (Signed)
[facility] EMERGENCY DEPARTMENT Provider Note   CSN: [id number] Arrival date & time: 01/01/20  1151     History Chief Complaint  Patient presents with  . multiple complaints    Melinda Jimenez is a 34 y.o. female.  33 y.o female with a PMH of HSV, headache, genital herpes presents to the ED with multiple complaints.  Patient describes headache, more so left-sided, states she feels like there is fluid on the left side of her head this is worse with turning of her head.  Also reports a prior history of HSV, states that she is concerned that the HSV is now in her spine.  She also reports ear fullness, was seen by her PCP a couple days ago and given medication, states the medication has not improved her symptoms.  Second complaint is requesting an HIV test, states that her partner who she was recently we tested positive for HIV, she was concerned for this.  Patient just establish care with PCP, states she was only seen via telehealth.   Reports sob occurs when she is a sleep, she reports having a seizure-like event when this occurs which worked her up, reports heart rate is elevated when this occurs.  Family history of sleep apnea.  Prior work-up for sleep apnea in the past.  The history is provided by the patient and medical records.       Past Medical History:  Diagnosis Date  . Anemia   . Anxiety    off meds with preg  . Carpal tunnel syndrome   . Chlamydia   . Genital HSV    Last OB: > 18yr ago; Valtrex 500 mg po prn outbreaks  . GERD (gastroesophageal reflux disease)   . Gestational diabetes    diet controlled  . Headache(784.0)   . IBS (irritable bowel syndrome)   . Infection    UTI  . Mood swings   . Panic attack   . Thyroid disease   . Trichomonas     Patient Active Problem List   Diagnosis Date Noted  . H/O: cesarean section 09/27/2016  . GERD without esophagitis 01/20/2013  . Anxiety and depression 01/20/2013  . CARPAL TUNNEL SYNDROME  06/02/2010  . Vitamin D deficiency 04/19/2010  . Herpes genitalis in women 04/18/2010  . Goiter, unspecified 04/18/2010    Past Surgical History:  Procedure Laterality Date  . CESAREAN SECTION N/A 05/20/2013   Procedure: CESAREAN SECTION;  Surgeon: Brock Bad, MD;  Location: [facility] ORS;  Service: Obstetrics;  Laterality: N/A;  . CESAREAN SECTION N/A 04/06/2017   Procedure: CESAREAN SECTION;  Surgeon: Hermina Staggers, MD;  Location: [facility] BIRTHING SUITES;  Service: Obstetrics;  Laterality: N/A;  . INDUCED ABORTION  2009  . VAGINAL DELIVERY  2007     OB History    Gravida  9   Para  4   Term  3   Preterm  1   AB  5   Living  3     SAB  2   TAB  3   Ectopic      Multiple  0   Live Births  3           Family History  Problem Relation Age of Onset  . Depression Mother   . Anxiety disorder Mother   . Breast cancer Other   . Bipolar disorder Brother   . Diabetes Maternal Grandmother   . Cancer Maternal Grandmother   . Hypertension Maternal Grandmother   .  Hypertension Paternal Grandmother   . Autism Other   . Schizophrenia Other     Social History   Tobacco Use  . Smoking status: Passive Smoke Exposure - Never Smoker  . Smokeless tobacco: Never Used  . Tobacco comment: was not a daily smoker, tried it  Substance Use Topics  . Alcohol use: No    Comment: Twice a month  . Drug use: No    Home Medications Prior to Admission medications   Medication Sig Start Date End Date Taking? Authorizing Provider  fluticasone (FLONASE) 50 MCG/ACT nasal spray Place 1 spray into both nostrils daily. 12/07/19  Yes Henderly, Britni A, PA-C  cetirizine (ZYRTEC ALLERGY) 10 MG tablet Take 1 tablet (10 mg total) by mouth daily. Patient not taking: Reported on 01/01/2020 12/07/19   Henderly, Britni A, PA-C  orlistat (ALLI) 60 MG capsule Take 1 capsule (60 mg total) by mouth 3 (three) times daily with meals. 12/29/19   Nicolette Bang, DO  pseudoephedrine (SUDAFED 12  HOUR) 120 MG 12 hr tablet Take 1 tablet (120 mg total) by mouth 2 (two) times daily. 12/29/19   Nicolette Bang, DO  valACYclovir (VALTREX) 1000 MG tablet Take 1 tablet (1,000 mg total) by mouth 2 (two) times daily. 12/29/19   Nicolette Bang, DO    Allergies    Zofran Alvis Lemmings hcl] and Latex  Review of Systems   Review of Systems  Constitutional: Negative for fever.  HENT: Negative for sore throat.   Respiratory: Negative for shortness of breath.   Cardiovascular: Negative for chest pain.  Gastrointestinal: Negative for abdominal pain and vomiting.  Genitourinary: Negative for flank pain and hematuria.  Musculoskeletal: Negative for back pain, myalgias, neck pain and neck stiffness.  Skin: Negative for pallor and wound.  Neurological: Positive for headaches. Negative for light-headedness.  All other systems reviewed and are negative.   Physical Exam Updated Vital Signs BP 104/82 (BP Location: Right Arm)   Pulse 85   Temp 97.9 F (36.6 C) (Oral)   Resp 18   SpO2 100%   Physical Exam Vitals and nursing note reviewed.  Constitutional:      General: She is not in acute distress.    Appearance: Normal appearance. She is well-developed.     Comments: Non ill appearing.  HENT:     Head: Normocephalic and atraumatic.     Right Ear: Tympanic membrane normal. No drainage, swelling or tenderness. Tympanic membrane is not injected, scarred, perforated or erythematous.     Left Ear: Tympanic membrane normal. No drainage, swelling or tenderness. Tympanic membrane is not injected, scarred, perforated or erythematous.     Ears:     Comments: Cerumen noted to BL ears.     Mouth/Throat:     Pharynx: No oropharyngeal exudate.  Eyes:     Pupils: Pupils are equal, round, and reactive to light.  Cardiovascular:     Rate and Rhythm: Regular rhythm.     Heart sounds: Normal heart sounds.  Pulmonary:     Effort: Pulmonary effort is normal. No respiratory distress.      Breath sounds: Normal breath sounds.  Abdominal:     General: Bowel sounds are normal. There is no distension.     Palpations: Abdomen is soft.     Tenderness: There is no abdominal tenderness.  Musculoskeletal:        General: No tenderness or deformity.     Cervical back: Normal range of motion.     Right lower  leg: No edema.     Left lower leg: No edema.  Skin:    General: Skin is warm and dry.  Neurological:     Mental Status: She is alert and oriented to person, place, and time.     Comments: Alert, oriented, thought content appropriate. Speech fluent without evidence of aphasia. Able to follow 2 step commands without difficulty.  Cranial Nerves:  II:  Peripheral visual fields grossly normal, pupils, round, reactive to light III,IV, VI: ptosis not present, extra-ocular motions intact bilaterally  V,VII: smile symmetric, facial light touch sensation equal VIII: hearing grossly normal bilaterally  IX,X: midline uvula rise  XI: bilateral shoulder shrug equal and strong XII: midline tongue extension  Motor:  5/5 in upper and lower extremities bilaterally including strong and equal grip strength and dorsiflexion/plantar flexion Sensory: light touch normal in all extremities.  Cerebellar: normal finger-to-nose with bilateral upper extremities, pronator drift negative Gait: normal gait and balance      ED Results / Procedures / Treatments   Labs (all labs ordered are listed, but only abnormal results are displayed) Labs Reviewed  RAPID HIV SCREEN (HIV 1/2 AB+AG)    EKG None  Radiology No results found.  Procedures Procedures (including critical care time)  Medications Ordered in ED Medications - No data to display  ED Course  I have reviewed the triage vital signs and the nursing notes.  Pertinent labs & imaging results that were available during my care of the patient were reviewed by me and considered in my medical decision making (see chart for details).      MDM Rules/Calculators/A&P  Patient with a past medical history of intermittent headaches, obstipation, HSV presents to the ED with complaints of headache along with bilateral ear pain and concerns for HIV.  Patient does report a prior history of HSV, does feel like this is likely in her brain, states she moves her head side to side and this hurts.  According to her chart which have extensive review, patient was seen by family medicine on 30 March, headaches where a complaint then, not suspicious for any CVA, SAH, or meningitis.  On today's visit patient is reporting the same symptoms, no neck rigidity on my exam, no visual disturbance, bilateral TMs are clear, no dizziness.  Patient continues to state "what is wrong with me ".  She also has multiple other complaints, we discussed about focusing on 2.  She is also requesting testing for HIV as she had a partner test positive in the past and she was concerned.  Rapid HIV was negative.  She was adamant of obtaining CT of her head, according to her chart which have extensively review she had a CT of her head about a year ago which was within normal limits.  Does not have any history of aneurysms, no photophobia or red flags.  CT head was reobtained at patient's request, this did not show any intracranial pathology.  Patient's vitals are within normal limits.  She reports PCP advised her to take some Zyrtec to help with her bilateral TM pain, she was requesting antibiotics on today's visit she states "can you just give me something for an ear infection ", after checking her TMs bilaterally which appear without any infectious signs, we discussed the appropriateness of antibiotic treatment but patient was not happy with this answer.  She was advised to follow-up with her PCP who can further aid her multiple complaints along with manage recurrent headaches and ear pain.  Patient  was upset after this encounter but agreed with treatment and plan.  She was  discharged in the ED in stable condition, return precautions discussed at length.  Portions of this note were generated with Scientist, clinical (histocompatibility and immunogenetics). Dictation errors may occur despite best attempts at proofreading.  Final Clinical Impression(s) / ED Diagnoses Final diagnoses:  Nonintractable episodic headache, unspecified headache type    Rx / DC Orders ED Discharge Orders    None       Claude Manges, PA-C 01/06/20 1501    Rolan Bucco, MD 01/07/20 [phone]

## 2020-01-01 NOTE — ED Notes (Signed)
Patient Alert and oriented to baseline. Stable and ambulatory to baseline. Patient verbalized understanding of the discharge instructions.  Patient belongings were taken by the patient.   

## 2020-01-01 NOTE — ED Notes (Signed)
Patient transported to CT 

## 2020-01-01 NOTE — Discharge Instructions (Signed)
Your CT Head today was normal.   Please follow up with your Primary care physician fort further management.

## 2020-01-13 ENCOUNTER — Emergency Department (HOSPITAL_COMMUNITY)
Admission: EM | Admit: 2020-01-13 | Discharge: 2020-01-13 | Disposition: A | Discharge status: Home / Self Care | Payer: Medicaid Other | Attending: Emergency Medicine | Admitting: Emergency Medicine

## 2020-01-13 ENCOUNTER — Encounter (HOSPITAL_COMMUNITY): Payer: Self-pay

## 2020-01-13 ENCOUNTER — Other Ambulatory Visit: Payer: Self-pay

## 2020-01-13 DIAGNOSIS — H9203 Otalgia, bilateral: Secondary | ICD-10-CM | POA: Diagnosis not present

## 2020-01-13 DIAGNOSIS — G4700 Insomnia, unspecified: Secondary | ICD-10-CM | POA: Diagnosis not present

## 2020-01-13 DIAGNOSIS — Z91040 Latex allergy status: Secondary | ICD-10-CM | POA: Insufficient documentation

## 2020-01-13 DIAGNOSIS — R1110 Vomiting, unspecified: Secondary | ICD-10-CM | POA: Diagnosis not present

## 2020-01-13 DIAGNOSIS — R112 Nausea with vomiting, unspecified: Secondary | ICD-10-CM | POA: Diagnosis present

## 2020-01-13 NOTE — ED Notes (Signed)
Patient verbalizes understanding of discharge instructions. Opportunity for questioning and answers were provided. Armband removed by staff, pt discharged from ED.  

## 2020-01-13 NOTE — ED Provider Notes (Signed)
[facility] EMERGENCY DEPARTMENT Provider Note   CSN: [id number] Arrival date & time: 01/13/20  1005     History Chief Complaint  Patient presents with  . Insomnia  . Emesis  . Headache    Melinda Jimenez is a 34 y.o. female presenting for evaluation of multiple complaints.   Patient states for the past several weeks, she has had issues with multiple things.  She reports she has not been sleeping well, as every time she falls asleep she feels like she wakes up gasping for air.  She states she has a family history of sleep apnea, but she personally has not been evaluated for this.  She has not brought this up with her primary care doctor.  She is not taking anything for sleep. Additionally, she states she is having intermittent nausea and vomiting.  She states she is throwing up almost every day.  Is not always associated with p.o. intake.  She is not feeling nauseous currently.  She denies abdominal pain. Additionally, patient states she has having intermittent headaches.  She has not taken anything for this, nothing makes it better or worse. Additionally patient states her feet are cold frequently.  Currently they are not. Additionally, patient states she is having ear fullness.  She has been seen by primary care for this, prescribed Zyrtec which she is not taking currently.  She has tried nasal sprays before, but states they do not work.  Additional history obtained from chart review.  Patient was seen earlier this month for similar symptoms.  At that time she was reporting headaches, insomnia, and ear problems.  She had a work-up which included a negative CT head and negative HIV testing.   HPI     Past Medical History:  Diagnosis Date  . Anemia   . Anxiety    off meds with preg  . Carpal tunnel syndrome   . Chlamydia   . 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 GERD (gastroesophageal reflux disease)   . Gestational diabetes    diet controlled  . Headache(784.0)   . IBS (irritable bowel syndrome)   . Infection    UTI  . Mood swings   . Panic attack   . Thyroid disease   . Trichomonas     Patient Active Problem List   Diagnosis Date Noted  . H/O: cesarean section 09/27/2016  . GERD without esophagitis 01/20/2013  . Anxiety and depression 01/20/2013  . CARPAL TUNNEL SYNDROME 06/02/2010  . Vitamin D deficiency 04/19/2010  . Herpes genitalis in women 04/18/2010  . Goiter, unspecified 04/18/2010    Past Surgical History:  Procedure Laterality Date  . CESAREAN SECTION N/A 05/20/2013   Procedure: CESAREAN SECTION;  Surgeon: Brock Bad, MD;  Location: [facility];  Service: Obstetrics;  Laterality: N/A;  . CESAREAN SECTION N/A 04/06/2017   Procedure: CESAREAN SECTION;  Surgeon: Hermina Staggers, MD;  Location: [facility] BIRTHING SUITES;  Service: Obstetrics;  Laterality: N/A;  . INDUCED ABORTION  2009  . VAGINAL DELIVERY  2007     OB History    Gravida  9   Para  4   Term  3   Preterm  1   AB  5   Living  3     SAB  2   TAB  3   Ectopic      Multiple  0   Live Births  3  Family History  Problem Relation Age of Onset  . Depression Mother   . Anxiety disorder Mother   . Breast cancer Other   . Bipolar disorder Brother   . Diabetes Maternal Grandmother   . Cancer Maternal Grandmother   . Hypertension Maternal Grandmother   . Hypertension Paternal Grandmother   . Autism Other   . Schizophrenia Other     Social History   Tobacco Use  . Smoking status: Passive Smoke Exposure - Never Smoker  . Smokeless tobacco: Never Used  . Tobacco comment: was not a daily smoker, tried it  Substance Use Topics  . Alcohol use: No    Comment: Twice a month  . Drug use: No    Home Medications Prior to Admission medications   Medication Sig Start Date End Date Taking? Authorizing Provider  cetirizine (ZYRTEC ALLERGY) 10 MG tablet Take 1 tablet (10 mg total) by mouth daily. Patient  not taking: Reported on 01/01/2020 12/07/19   Henderly, Britni A, PA-C  fluticasone (FLONASE) 50 MCG/ACT nasal spray Place 1 spray into both nostrils daily. 12/07/19   Henderly, Britni A, PA-C  orlistat (ALLI) 60 MG capsule Take 1 capsule (60 mg total) by mouth 3 (three) times daily with meals. 12/29/19   Nicolette Bang, DO  pseudoephedrine (SUDAFED 12 HOUR) 120 MG 12 hr tablet Take 1 tablet (120 mg total) by mouth 2 (two) times daily. 12/29/19   Nicolette Bang, DO  valACYclovir (VALTREX) 1000 MG tablet Take 1 tablet (1,000 mg total) by mouth 2 (two) times daily. 12/29/19   Nicolette Bang, DO    Allergies    Zofran Alvis Lemmings hcl] and Latex  Review of Systems   Review of Systems  Gastrointestinal: Positive for nausea and vomiting.  Skin:       Feet are cold  Neurological: Positive for headaches.  Psychiatric/Behavioral: Positive for sleep disturbance.  All other systems reviewed and are negative.   Physical Exam Updated Vital Signs BP 116/88 (BP Location: Right Arm)   Pulse 96   Temp 98.8 F (37.1 C) (Oral)   Resp 16   Ht 5\' 8"  (1.727 m)   Wt 108 kg   SpO2 97%   BMI 36.19 kg/m   Physical Exam Vitals and nursing note reviewed.  Constitutional:      General: She is not in acute distress.    Appearance: She is well-developed. She is obese.     Comments: Obese female resting comfortably in the bed in no acute distress  HENT:     Head: Normocephalic and atraumatic.     Right Ear: Tympanic membrane, ear canal and external ear normal.     Left Ear: Tympanic membrane, ear canal and external ear normal.     Ears:     Comments: Mild bilateral serous effusion without erythema or bulging of the TMs. Eyes:     Extraocular Movements: Extraocular movements intact.     Conjunctiva/sclera: Conjunctivae normal.     Pupils: Pupils are equal, round, and reactive to light.     Comments: EOMI and PERRLA.  No nystagmus.  Cardiovascular:     Rate and Rhythm:  Normal rate and regular rhythm.     Pulses: Normal pulses.  Pulmonary:     Effort: Pulmonary effort is normal. No respiratory distress.     Breath sounds: Normal breath sounds. No wheezing.  Abdominal:     General: There is no distension.     Palpations: Abdomen is soft. There is no  mass.     Tenderness: There is no abdominal tenderness. There is no guarding or rebound.     Comments: No tenderness palpation of the abdomen.  Soft bloated, guarding, distention.  Negative rebound.  No peritonitis.  Musculoskeletal:        General: Normal range of motion.     Cervical back: Normal range of motion and neck supple.     Right lower leg: No edema.     Left lower leg: No edema.     Comments: Pedal pulses 2+ bilaterally.  No abnormal coloration of the skin.  Temperature is equal bilaterally.  Skin:    General: Skin is warm and dry.     Capillary Refill: Capillary refill takes less than 2 seconds.  Neurological:     Mental Status: She is alert and oriented to person, place, and time.     Comments: No obvious neurologic deficits.  CN intact.  Nose to finger intact.  Fine movement and coronation intact.  Strength and sensation intact x4.     ED Results / Procedures / Treatments   Labs (all labs ordered are listed, but only abnormal results are displayed) Labs Reviewed - No data to display  EKG None  Radiology No results found.  Procedures Procedures (including critical care time)  Medications Ordered in ED Medications - No data to display  ED Course  I have reviewed the triage vital signs and the nursing notes.  Pertinent labs & imaging results that were available during my care of the patient were reviewed by me and considered in my medical decision making (see chart for details).    MDM Rules/Calculators/A&P                      Patient presented for evaluation of multiple complaints including headache, insomnia, vomiting, and ear pain.  When asked what brings patient into the  ER specifically today, she states the vomiting and insomnia are her main concerns.  I encouraged patient to follow-up with her primary care doctor regarding sleep apnea study, as her insomnia appears to be related to waking up gasping for air.  Additionally consider reflux, especially as patient is having intermittent vomiting.  Offered medication, patient declined.  Offered nausea medication and p.o. challenge, patient declined.  Patient states he does not want a medicine at this time.  Offered laboratory testing for further evaluation, patient declined.  Patient states she wants me to take a look at her ears, and if they are okay, she does not want anything else and just wants to be discharged.  I evaluated patient's ears, and there are no signs of infection.  They do show bilateral effusions.  Offered nasal spray, patient declined.  Offered referral to ENT, patient declined.  At this time, patient appears safe for discharge.  Return precautions given.  Patient states she understands and agrees to plan.   Final Clinical Impression(s) / ED Diagnoses Final diagnoses:  Ear pain, bilateral  Vomiting, intractability of vomiting not specified, presence of nausea not specified, unspecified vomiting type  Insomnia, unspecified type    Rx / DC Orders ED Discharge Orders    None       Alveria Apley, PA-C 01/13/20 1844    Raeford Razor, MD 01/16/20 [phone]

## 2020-01-13 NOTE — Discharge Instructions (Addendum)
You may try melatonin as needed for sleeping.  Continue taking zyrtec daily.  You may benefit from evaluation for sleep apnea. Make sure you talk with your doctor about your sleeping and breathing symptoms.  Follow up with your primary care doctor as needed for further evaluation.  Return to the ER if you develop any new, worsening, or concerning symptoms.

## 2020-01-13 NOTE — ED Triage Notes (Signed)
Pt reports headache, emesis and insomnia for the past month. Pt seen here for the same 2 weeks ago. Pt took a muscle relaxer this morning, pt drowsy in triage.

## 2020-01-18 ENCOUNTER — Telehealth (INDEPENDENT_AMBULATORY_CARE_PROVIDER_SITE_OTHER): Payer: Medicaid Other | Admitting: Internal Medicine

## 2020-01-18 ENCOUNTER — Encounter: Payer: Self-pay | Admitting: Internal Medicine

## 2020-01-18 DIAGNOSIS — F411 Generalized anxiety disorder: Secondary | ICD-10-CM | POA: Diagnosis not present

## 2020-01-18 MED ORDER — ALPRAZOLAM 0.5 MG PO TABS: 0.5000 mg | ORAL_TABLET | Freq: Every evening | ORAL | 0 refills | Status: DC | PRN

## 2020-01-18 MED ORDER — SERTRALINE HCL 25 MG PO TABS: ORAL_TABLET | ORAL | 1 refills | Status: DC

## 2020-01-18 NOTE — Progress Notes (Signed)
Virtual Visit via Telephone Note  I connected with Metta Clines, on 01/18/2020 at 4:14 PM by telephone due to the COVID-19 pandemic and verified that I am speaking with the correct person using two identifiers.   Consent: I discussed the limitations, risks, security and privacy concerns of performing an evaluation and management service by telephone and the availability of in person appointments. I also discussed with the patient that there may be a patient responsible charge related to this service. The patient expressed understanding and agreed to proceed.   Location of Patient: Home   Location of Provider: Clinic    Persons participating in Telemedicine visit: Shameika Speelman [facility] Dr. Earlene Plater      History of Present Illness: Patient has concerns about anxiety. Has been having panic attacks over the past two weeks and has been occurring every other day. She has used Xanax in the past. She is going on a trip on Saturday, flying to [place]. Worried about being anxious on the plane. Would like to start a medication that would help her overall level of anxiety/worrying over time.   GAD 7 : Generalized Anxiety Score 01/18/2020 05/14/2017  Nervous, Anxious, on Edge 2 0  Control/stop worrying 2 0  Worry too much - different things 2 0  Trouble relaxing 2 0  Restless 2 0  Easily annoyed or irritable 2 0  Afraid - awful might happen 2 0  Total GAD 7 Score 14 0     Past Medical History:  Diagnosis Date  . Anemia   . Anxiety    off meds with preg  . Carpal tunnel syndrome   . Chlamydia   . Genital HSV    Last OB: > 15yr ago; Valtrex 500 mg po prn outbreaks  . GERD (gastroesophageal reflux disease)   . Gestational diabetes    diet controlled  . Headache(784.0)   . IBS (irritable bowel syndrome)   . Infection    UTI  . Mood swings   . Panic attack   . Thyroid disease   . Trichomonas    Allergies  Allergen Reactions  . Zofran [Ondansetron Hcl] Nausea And  Vomiting  . Latex Itching    No current outpatient medications on file prior to visit.   No current facility-administered medications on file prior to visit.    Observations/Objective: NAD. Speaking clearly.  Work of breathing normal.  Alert and oriented. Mood appropriate.   Assessment and Plan: 1. Anxiety state Will start Zoloft at 25 mg, titrate to 50 mg if tolerates. Discussed potential side effects. No SI or safety concerns at present. Will give Rx for Xanax due to multiple panic attacks over the past couple weeks and nervousness surrounding travel. Discussed this is not a long term Rx and as we titrate to appropriate Zoloft dose would plan to d/c Xanax. Return in 4-6 weeks for mood f/u.  - sertraline (ZOLOFT) 25 MG tablet; Take one tablet daily. If tolerating, increase to two tablets in one week.  Dispense: 60 tablet; Refill: 1 - ALPRAZolam (XANAX) 0.5 MG tablet; Take 1 tablet (0.5 mg total) by mouth at bedtime as needed for anxiety.  Dispense: 30 tablet; Refill: 0    Follow Up Instructions: Anxiety f/u in 4-6 weeks    I discussed the assessment and treatment plan with the patient. The patient was provided an opportunity to ask questions and all were answered. The patient agreed with the plan and demonstrated an understanding of the instructions.   The  patient was advised to call back or seek an in-person evaluation if the symptoms worsen or if the condition fails to improve as anticipated.     I provided 14 minutes total of non-face-to-face time during this encounter including median intraservice time, reviewing previous notes, investigations, ordering medications, medical decision making, coordinating care and patient verbalized understanding at the end of the visit.    Phill Myron, D.O. Primary Care at [facility]  01/18/2020, 4:14 PM

## 2020-01-20 ENCOUNTER — Other Ambulatory Visit: Payer: Self-pay

## 2020-01-20 ENCOUNTER — Emergency Department (HOSPITAL_COMMUNITY)
Admission: EM | Admit: 2020-01-20 | Discharge: 2020-01-20 | Disposition: A | Discharge status: Home / Self Care | Payer: Medicaid Other | Attending: Emergency Medicine | Admitting: Emergency Medicine

## 2020-01-20 DIAGNOSIS — R0981 Nasal congestion: Secondary | ICD-10-CM | POA: Insufficient documentation

## 2020-01-20 DIAGNOSIS — H9203 Otalgia, bilateral: Secondary | ICD-10-CM | POA: Insufficient documentation

## 2020-01-20 DIAGNOSIS — R519 Headache, unspecified: Secondary | ICD-10-CM | POA: Diagnosis not present

## 2020-01-20 DIAGNOSIS — G4700 Insomnia, unspecified: Secondary | ICD-10-CM | POA: Insufficient documentation

## 2020-01-20 MED ORDER — PREDNISONE 20 MG PO TABS: 40.0000 mg | ORAL_TABLET | Freq: Every day | ORAL | 0 refills | Status: DC

## 2020-01-20 MED ORDER — ZOLPIDEM TARTRATE 5 MG PO TABS: 5.0000 mg | ORAL_TABLET | Freq: Every evening | ORAL | 0 refills | Status: DC | PRN

## 2020-01-20 MED ORDER — FLUTICASONE PROPIONATE 50 MCG/ACT NA SUSP: 1.0000 | Freq: Every day | NASAL | 2 refills | Status: DC

## 2020-01-20 NOTE — ED Triage Notes (Signed)
Pt reports insomnia for the past 2 weeks, states she took 2 xanax and robitussin today to see if it would help and it did not make her sleepy. Pt also reports pain in both her ears. Pt seen here twice within the last month for the same. Pt a.o, ambulatory

## 2020-01-20 NOTE — ED Provider Notes (Signed)
[name] EMERGENCY DEPARTMENT Provider Note   CSN: [id number] Arrival date & time: 01/20/20  1442     History Chief Complaint  Patient presents with  . Insomnia    Melinda Jimenez is a 34 y.o. female.  Patient is a 34 year old female with a history of GERD, prior gestational diabetes, IBS and prior anxiety who is presenting today with multiple complaints of congestion, ear pain, facial pain and difficulty sleeping.  Patient states approximately 2 to 3 weeks ago she started having severe congestion, bilateral ear pain but worse on the left, left-sided facial pain and headaches.  She has tried Zyrtec and Benadryl and nasal sprays but feel that it makes the congestion worse.  She is also been under more stress and states she does have issues with anxiety.  She did have a evaluation in the ER twice this month and did undergo head CT at the beginning of the month which was negative.  Patient reports the congestion is improving but is still significant on the left side.  She does not usually get seasonal allergies but had not been tested for Covid.  Patient did see her doctor 2 days ago and at that time discussed anxiety and intermittent panic type attacks and was prescribed Zoloft and Xanax.  Patient reports she took Xanax last night and Robitussin but was not able to sleep.  She states she wakes up because she is congested and having a hard time breathing.  Prior to this patient has never had difficulty sleeping in the past.  She denies any cough or fever.  Headaches have significantly improved.  She denies any thoughts of hurting herself and reports she has not started the Zoloft and does not know if she will actually start taking it or not.   Insomnia       Past Medical History:  Diagnosis Date  . Anemia   . Anxiety    off meds with preg  . Carpal tunnel syndrome   . Chlamydia   . Genital HSV    Last OB: > 24yr ago; Valtrex 500 mg po prn outbreaks  . GERD  (gastroesophageal reflux disease)   . Gestational diabetes    diet controlled  . Headache(784.0)   . IBS (irritable bowel syndrome)   . Infection    UTI  . Mood swings   . Panic attack   . Thyroid disease   . Trichomonas     Patient Active Problem List   Diagnosis Date Noted  . H/O: cesarean section 09/27/2016  . GERD without esophagitis 01/20/2013  . Anxiety and depression 01/20/2013  . CARPAL TUNNEL SYNDROME 06/02/2010  . Vitamin D deficiency 04/19/2010  . Herpes genitalis in women 04/18/2010  . Goiter, unspecified 04/18/2010    Past Surgical History:  Procedure Laterality Date  . CESAREAN SECTION N/A 05/20/2013   Procedure: CESAREAN SECTION;  Surgeon: Brock Bad, MD;  Location: [facility];  Service: Obstetrics;  Laterality: N/A;  . CESAREAN SECTION N/A 04/06/2017   Procedure: CESAREAN SECTION;  Surgeon: Hermina Staggers, MD;  Location: [facility]THING SUIT[facility];  Service: Obstetrics;  Laterality: N/A;  . INDUCED ABORTION  2009  . VAGINAL DELIVERY  2007     OB History    Gravida  9   Para  4   Term  3   Preterm  1   AB  5   Living  3     SAB  2   TAB  3  Ectopic      Multiple  0   Live Births  3           Family History  Problem Relation Age of Onset  . Depression Mother   . Anxiety disorder Mother   . Breast cancer Other   . Bipolar disorder Brother   . Diabetes Maternal Grandmother   . Cancer Maternal Grandmother   . Hypertension Maternal Grandmother   . Hypertension Paternal Grandmother   . Autism Other   . Schizophrenia Other     Social History   Tobacco Use  . Smoking status: Passive Smoke Exposure - Never Smoker  . Smokeless tobacco: Never Used  . Tobacco comment: was not a daily smoker, tried it  Substance Use Topics  . Alcohol use: No    Comment: Twice a month  . Drug use: No    Home Medications Prior to Admission medications   Medication Sig Start Date End Date Taking? Authorizing Provider  ALPRAZolam Duanne Moron) 0.5 MG  tablet Take 1 tablet (0.5 mg total) by mouth at bedtime as needed for anxiety. 01/18/20   Nicolette Bang, DO  sertraline (ZOLOFT) 25 MG tablet Take one tablet daily. If tolerating, increase to two tablets in one week. 01/18/20   Nicolette Bang, DO    Allergies    Zofran Alvis Lemmings hcl] and Latex  Review of Systems   Review of Systems  Psychiatric/Behavioral: The patient has insomnia.   All other systems reviewed and are negative.   Physical Exam Updated Vital Signs BP (!) 144/88 (BP Location: Left Wrist)   Pulse (!) 112   Temp (!) 97.5 F (36.4 C) (Oral)   Resp 18   Ht 5\' 8"  (1.727 m)   Wt 108.9 kg   SpO2 97%   BMI 36.49 kg/m   Physical Exam Vitals and nursing note reviewed.  Constitutional:      General: She is not in acute distress.    Appearance: Normal appearance. She is well-developed. She is obese.  HENT:     Head: Normocephalic and atraumatic.     Right Ear: A middle ear effusion is present. Tympanic membrane is not injected, erythematous or retracted.     Left Ear: A middle ear effusion is present. Tympanic membrane is bulging. Tympanic membrane is not injected, erythematous or retracted.     Nose: Mucosal edema and congestion present.     Left Sinus: Maxillary sinus tenderness and frontal sinus tenderness present.  Eyes:     Pupils: Pupils are equal, round, and reactive to light.  Cardiovascular:     Rate and Rhythm: Regular rhythm. Tachycardia present.     Heart sounds: Normal heart sounds. No murmur. No friction rub.  Pulmonary:     Effort: Pulmonary effort is normal.  Musculoskeletal:        General: No tenderness. Normal range of motion.     Comments: No edema  Skin:    General: Skin is warm and dry.     Findings: No rash.  Neurological:     Mental Status: She is alert and oriented to person, place, and time.     Cranial Nerves: No cranial nerve deficit.  Psychiatric:        Mood and Affect: Mood normal.        Behavior:  Behavior normal.        Thought Content: Thought content normal.     Comments: Calm and cooperative.  No evidence of anxiousness at this time  ED Results / Procedures / Treatments   Labs (all labs ordered are listed, but only abnormal results are displayed) Labs Reviewed - No data to display  EKG None  Radiology No results found.  Procedures Procedures (including critical care time)  Medications Ordered in ED Medications - No data to display  ED Course  I have reviewed the triage vital signs and the nursing notes.  Pertinent labs & imaging results that were available during my care of the patient were reviewed by me and considered in my medical decision making (see chart for details).    MDM Rules/Calculators/A&P                      Patient presenting today with insomnia as well as URI symptoms that have been persistent.  Patient complains of severe congestion and left-sided ear pain.  No prior history of allergies however concerned that patient symptoms could be related to seasonal allergies versus possibility she had Covid 3 weeks ago and symptoms are still resolving.  Patient has no meningeal signs or fever.  She had a head CT earlier this month which was negative so low suspicion for space-occupying lesion.  Patient's symptoms are not classic for intercranial hypertension.  Patient does have fluid behind bilateral TMs and significant swelling of her naris.  She reports that Zyrtec and Claritin make her feel worse as well as Benadryl and nasal sprays.  We will try a short course of prednisone to see if that will help decrease inflammation.  She also reports that the Xanax is not helping her sleep and just makes her feel worse.  Will give 4 pills of Ambien that she can try and stressed the importance of not mixing that with Xanax.  Also encourage follow-up with her doctor if the symptoms do not improve and recommended she try Flonase as well for her congestion.  Final Clinical  Impression(s) / ED Diagnoses Final diagnoses:  Nasal congestion with rhinorrhea  Insomnia, unspecified type    Rx / DC Orders ED Discharge Orders         Ordered    zolpidem (AMBIEN) 5 MG tablet  At bedtime PRN     01/20/20 1628    predniSONE (DELTASONE) 20 MG tablet  Daily     01/20/20 1628    fluticasone (FLONASE) 50 MCG/ACT nasal spray  Daily     01/20/20 1628           Gwyneth Sprout, MD 01/20/20 1628

## 2020-01-20 NOTE — Discharge Instructions (Signed)
Start doing the prednisone and nasal spray for the congestion.  Try the ambien for sleep but do not mix with the xanax.  You can try 1 or the other but not both.

## 2020-01-21 ENCOUNTER — Encounter (HOSPITAL_COMMUNITY): Payer: Self-pay | Admitting: Emergency Medicine

## 2020-01-21 ENCOUNTER — Other Ambulatory Visit: Payer: Self-pay

## 2020-01-21 ENCOUNTER — Emergency Department (HOSPITAL_COMMUNITY)
Admission: EM | Admit: 2020-01-21 | Discharge: 2020-01-21 | Disposition: A | Discharge status: Home / Self Care | Payer: Medicaid Other | Attending: Emergency Medicine | Admitting: Emergency Medicine

## 2020-01-21 DIAGNOSIS — R0981 Nasal congestion: Secondary | ICD-10-CM

## 2020-01-21 DIAGNOSIS — G4700 Insomnia, unspecified: Secondary | ICD-10-CM | POA: Insufficient documentation

## 2020-01-21 MED ORDER — AFRIN NASAL SPRAY 0.05 % NA SOLN: 1.0000 | Freq: Two times a day (BID) | NASAL | 0 refills | Status: AC

## 2020-01-21 NOTE — Discharge Instructions (Addendum)
Take Afrin twice a day as needed for the next 3 days.  Do not use it longer than 3 days, as this can cause worsening rebound congestion (your nose can get addicted to the medicine).  Follow-up with the ear nose and throat doctor for congestion is not improving with the Afrin and prednisone. Follow-up with your primary care doctor regarding your insomnia, you may need testing for sleep apnea. You may call the urgent care listed below to ask what kind of Covid test they do. Return to the emergency room with any new, sudden, concerning symptoms.

## 2020-01-21 NOTE — ED Notes (Signed)
This RN went to discharge pt and pt was not in the room. Pt left before getting prescription and discharge paperwork. This RN informed PA Sophia.

## 2020-01-21 NOTE — ED Triage Notes (Signed)
Pt to ED with multiple complaints.  Was seen here yesterday and several other times within the last month for same symptoms.  Nasal congestion, ear pain,  Sts her aunt had covid last week and thinks she was exposed even though she wasn't around her.

## 2020-01-21 NOTE — ED Provider Notes (Signed)
[facility] EMERGENCY DEPARTMENT Provider Note   CSN: [id number] Arrival date & time: 01/21/20  1642     History Chief Complaint  Patient presents with  . Multiple Complaints    Melinda Jimenez is a 34 y.o. female presenting for evaluation of nasal congestion, difficulty sleeping, and requesting Covid test.  This is patient's fourth visit to the ER this month for the same symptoms.  I personally saw patient 2 weeks ago for the same.  Patient states she continues to have nasal congestion, but is worse on the left side.  She states she was started on steroids yesterday, has only taken 1 dose so far.  She states she has tried allergy medicine without improvement.  She states when she takes Flonase it feels like her nose swells up and has a reaction.  She denies fevers or chills.  She denies cough. Additionally, patient reports she continues to have difficulty sleeping.  She continues to tell me that she wakes up gasping for air.  When I asked if patient has followed up with her primary care doctor, patient states she did not go to the appointment because she had a lot going on and was overwhelmed that day.  Additionally, patient is requesting Covid test.  HPI     Past Medical History:  Diagnosis Date  . Anemia   . Anxiety    off meds with preg  . Carpal tunnel syndrome   . Chlamydia   . Genital HSV    Last OB: > 69yr ago; Valtrex 500 mg po prn outbreaks  . GERD (gastroesophageal reflux disease)   . Gestational diabetes    diet controlled  . Headache(784.0)   . IBS (irritable bowel syndrome)   . Infection    UTI  . Mood swings   . Panic attack   . Thyroid disease   . Trichomonas     Patient Active Problem List   Diagnosis Date Noted  . H/O: cesarean section 09/27/2016  . GERD without esophagitis 01/20/2013  . Anxiety and depression 01/20/2013  . CARPAL TUNNEL SYNDROME 06/02/2010  . Vitamin D deficiency 04/19/2010  . Herpes genitalis in women  04/18/2010  . Goiter, unspecified 04/18/2010    Past Surgical History:  Procedure Laterality Date  . CESAREAN SECTION N/A 05/20/2013   Procedure: CESAREAN SECTION;  Surgeon: Brock Bad, MD;  Location: [facility];  Service: Obstetrics;  Laterality: N/A;  . CESAREAN SECTION N/A 04/06/2017   Procedure: CESAREAN SECTION;  Surgeon: Hermina Staggers, MD;  Location: [facility] BIRTHING SUITES;  Service: Obstetrics;  Laterality: N/A;  . INDUCED ABORTION  2009  . VAGINAL DELIVERY  2007     OB History    Gravida  9   Para  4   Term  3   Preterm  1   AB  5   Living  3     SAB  2   TAB  3   Ectopic      Multiple  0   Live Births  3           Family History  Problem Relation Age of Onset  . Depression Mother   . Anxiety disorder Mother   . Breast cancer Other   . Bipolar disorder Brother   . Diabetes Maternal Grandmother   . Cancer Maternal Grandmother   . Hypertension Maternal Grandmother   . Hypertension Paternal Grandmother   . Autism Other   . Schizophrenia Other  Social History   Tobacco Use  . Smoking status: Passive Smoke Exposure - Never Smoker  . Smokeless tobacco: Never Used  . Tobacco comment: was not a daily smoker, tried it  Substance Use Topics  . Alcohol use: No    Comment: Twice a month  . Drug use: No    Home Medications Prior to Admission medications   Medication Sig Start Date End Date Taking? Authorizing Provider  ALPRAZolam Duanne Moron) 0.5 MG tablet Take 1 tablet (0.5 mg total) by mouth at bedtime as needed for anxiety. 01/18/20  Yes Nicolette Bang, DO  predniSONE (DELTASONE) 20 MG tablet Take 2 tablets (40 mg total) by mouth daily. 01/20/20  Yes Plunkett, Loree Fee, MD  fluticasone (FLONASE) 50 MCG/ACT nasal spray Place 1 spray into both nostrils daily. Use for 2-3 weeks to help with the nasal congestion and ear pain Patient not taking: Reported on 01/21/2020 01/20/20   Blanchie Dessert, MD  oxymetazoline (AFRIN NASAL SPRAY) 0.05 %  nasal spray Place 1 spray into both nostrils 2 (two) times daily for 3 days. 01/21/20 01/24/20  Rosea Dory, PA-C  sertraline (ZOLOFT) 25 MG tablet Take one tablet daily. If tolerating, increase to two tablets in one week. Patient not taking: Reported on 01/21/2020 01/18/20   Nicolette Bang, DO  zolpidem (AMBIEN) 5 MG tablet Take 1 tablet (5 mg total) by mouth at bedtime as needed for sleep. Patient not taking: Reported on 01/21/2020 01/20/20   Blanchie Dessert, MD    Allergies    Zofran Alvis Lemmings hcl] and Latex  Review of Systems   Review of Systems  HENT: Positive for congestion.   Psychiatric/Behavioral: Positive for sleep disturbance.    Physical Exam Updated Vital Signs BP 131/87   Pulse (!) 102   Temp 98.6 F (37 C) (Oral)   Resp 20   Ht 5\' 8"  (1.727 m)   Wt 108.9 kg   LMP 01/07/2020   SpO2 98%   BMI 36.49 kg/m   Physical Exam Vitals and nursing note reviewed.  Constitutional:      General: She is not in acute distress.    Appearance: She is well-developed.     Comments: Resting in the bed in no acute distress  HENT:     Head: Normocephalic and atraumatic.     Mouth/Throat:     Comments: Mucosal edema, worse on the left.  No significant rhinorrhea.  Mild bilateral effusions behind both TMs without erythema or bulging.  OP clear without tonsillar swelling or exudate. Cardiovascular:     Rate and Rhythm: Normal rate and regular rhythm.     Pulses: Normal pulses.  Pulmonary:     Effort: Pulmonary effort is normal.     Breath sounds: Normal breath sounds.  Abdominal:     General: There is no distension.  Musculoskeletal:        General: Normal range of motion.     Cervical back: Normal range of motion.  Skin:    General: Skin is warm.     Findings: No rash.  Neurological:     Mental Status: She is alert and oriented to person, place, and time.     ED Results / Procedures / Treatments   Labs (all labs ordered are listed, but only abnormal  results are displayed) Labs Reviewed - No data to display  EKG None  Radiology No results found.  Procedures Procedures (including critical care time)  Medications Ordered in ED Medications - No data to display  ED  Course  I have reviewed the triage vital signs and the nursing notes.  Pertinent labs & imaging results that were available during my care of the patient were reviewed by me and considered in my medical decision making (see chart for details).    MDM Rules/Calculators/A&P                      Patient presenting for evaluation of multiple complaints.  Patient is concerned about her continued nasal congestion and ear fullness.  Exam is not consistent with infection, patient is without fevers.  She does have signs of inflammation on exam.  As patient has not had success with Flonase, I encouraged Afrin, and discussed risks of using Afrin for more than 3 days.  I encourage patient follow-up with ENT, as he has had multiple visits in the ER for this without improvement.  I encourage patient continue taking prednisone, as this may help her symptoms.  I do not leave she needs antibiotics at this time. Additionally, patient states she is still having issues with sleeping.  She describes it as waking up gasping for air.  I still believe this likely has to do with sleep apnea, encourage patient to follow-up with her primary care doctor for further evaluation. Additionally, patient is requesting Covid testing.  However she is concerned about the kind of Covid test we have in the ER.  She wants only a nasal swab, not a nasopharyngeal swab.  I recommended she call urgent cares in the area to ask what Covid test they have, as we do not do nasal swabs at this time. At this time, patient received a discharge.  Return precautions given.  Patient states he understands and agrees to plan.  Informed by RN that patient left prior to receiving discharge paperwork and Afrin prescription.  Final  Clinical Impression(s) / ED Diagnoses Final diagnoses:  Nasal congestion  Insomnia, unspecified type    Rx / DC Orders ED Discharge Orders         Ordered    oxymetazoline (AFRIN NASAL SPRAY) 0.05 % nasal spray  2 times daily     01/21/20 1825           Alveria Apley, PA-C 01/21/20 1901    Milagros Loll, MD 01/22/20 2240

## 2020-01-23 ENCOUNTER — Other Ambulatory Visit: Payer: Self-pay

## 2020-01-23 ENCOUNTER — Emergency Department (HOSPITAL_COMMUNITY)
Admission: EM | Admit: 2020-01-23 | Discharge: 2020-01-23 | Disposition: A | Discharge status: Home / Self Care | Payer: Medicaid Other | Attending: Emergency Medicine | Admitting: Emergency Medicine

## 2020-01-23 DIAGNOSIS — R0602 Shortness of breath: Secondary | ICD-10-CM | POA: Diagnosis not present

## 2020-01-23 DIAGNOSIS — Z5321 Procedure and treatment not carried out due to patient leaving prior to being seen by health care provider: Secondary | ICD-10-CM | POA: Insufficient documentation

## 2020-01-23 DIAGNOSIS — R110 Nausea: Secondary | ICD-10-CM | POA: Diagnosis not present

## 2020-01-23 DIAGNOSIS — R0989 Other specified symptoms and signs involving the circulatory and respiratory systems: Secondary | ICD-10-CM | POA: Diagnosis not present

## 2020-01-23 NOTE — ED Triage Notes (Signed)
Per [facility], patient was drinking liquid, got choked - became shobr, had dry heaving and nausea. Patient says symptoms have passed, but she still wants to be evaluated.

## 2020-02-03 ENCOUNTER — Emergency Department (HOSPITAL_COMMUNITY)
Admission: EM | Admit: 2020-02-03 | Discharge: 2020-02-03 | Disposition: A | Discharge status: Home / Self Care | Payer: Medicaid Other | Attending: Emergency Medicine | Admitting: Emergency Medicine

## 2020-02-03 ENCOUNTER — Encounter (HOSPITAL_COMMUNITY): Payer: Self-pay | Admitting: Emergency Medicine

## 2020-02-03 ENCOUNTER — Other Ambulatory Visit: Payer: Self-pay

## 2020-02-03 DIAGNOSIS — Z7722 Contact with and (suspected) exposure to environmental tobacco smoke (acute) (chronic): Secondary | ICD-10-CM | POA: Diagnosis not present

## 2020-02-03 DIAGNOSIS — Z91040 Latex allergy status: Secondary | ICD-10-CM | POA: Insufficient documentation

## 2020-02-03 DIAGNOSIS — Z79899 Other long term (current) drug therapy: Secondary | ICD-10-CM | POA: Insufficient documentation

## 2020-02-03 DIAGNOSIS — R1110 Vomiting, unspecified: Secondary | ICD-10-CM | POA: Diagnosis present

## 2020-02-03 DIAGNOSIS — R112 Nausea with vomiting, unspecified: Secondary | ICD-10-CM | POA: Insufficient documentation

## 2020-02-03 LAB — COMPREHENSIVE METABOLIC PANEL
ALT: 18 U/L (ref 0–44)
AST: 15 U/L (ref 15–41)
Albumin: 3.7 g/dL (ref 3.5–5.0)
Alkaline Phosphatase: 59 U/L (ref 38–126)
Anion gap: 11 (ref 5–15)
BUN: 8 mg/dL (ref 6–20)
CO2: 24 mmol/L (ref 22–32)
Calcium: 9.4 mg/dL (ref 8.9–10.3)
Chloride: 103 mmol/L (ref 98–111)
Creatinine, Ser: 0.7 mg/dL (ref 0.44–1.00)
GFR calc Af Amer: >60 mL/min (ref >60)
GFR calc non Af Amer: >60 mL/min (ref >60)
Glucose, Bld: 98 mg/dL (ref 70–99)
Potassium: 3.9 mmol/L (ref 3.5–5.1)
Sodium: 138 mmol/L (ref 135–145)
Total Bilirubin: 0.9 mg/dL (ref 0.3–1.2)
Total Protein: 8 g/dL (ref 6.5–8.1)

## 2020-02-03 LAB — URINALYSIS, ROUTINE W REFLEX MICROSCOPIC
Bacteria, UA: NONE SEEN
Bilirubin Urine: NEGATIVE
Glucose, UA: NEGATIVE mg/dL
Hgb urine dipstick: NEGATIVE
Ketones, ur: NEGATIVE mg/dL
Leukocytes,Ua: NEGATIVE
Nitrite: NEGATIVE
Protein, ur: 30 mg/dL — AB
Specific Gravity, Urine: 1.013 (ref 1.005–1.030)
pH: 6 (ref 5.0–8.0)

## 2020-02-03 LAB — CBC
HCT: 37 % (ref 36.0–46.0)
Hemoglobin: 11.3 g/dL — ABNORMAL LOW (ref 12.0–15.0)
MCH: 23.5 pg — ABNORMAL LOW (ref 26.0–34.0)
MCHC: 30.5 g/dL (ref 30.0–36.0)
MCV: 76.9 fL — ABNORMAL LOW (ref 80.0–100.0)
Platelets: 393 10*3/uL (ref 150–400)
RBC: 4.81 MIL/uL (ref 3.87–5.11)
RDW: 17.2 % — ABNORMAL HIGH (ref 11.5–15.5)
WBC: 11.8 10*3/uL — ABNORMAL HIGH (ref 4.0–10.5)
nRBC: 0 % (ref 0.0–0.2)

## 2020-02-03 LAB — I-STAT BETA HCG BLOOD, ED (MC, WL, AP ONLY): I-stat hCG, quantitative: <5 m[IU]/mL (ref <5)

## 2020-02-03 LAB — LIPASE, BLOOD: Lipase: 24 U/L (ref 11–51)

## 2020-02-03 NOTE — ED Triage Notes (Signed)
Patient states she thinks she took the wrong medication last night. Was supposed to take her stool softener when she took her sons risperidone. C/o nausea and emesis onset of today. Requesting IV fluids.

## 2020-02-03 NOTE — ED Provider Notes (Signed)
[facility] EMERGENCY DEPARTMENT Provider Note   CSN: [id number] Arrival date & time: 02/03/20  1116     History Chief Complaint  Patient presents with  . Medication Reaction  . Emesis    Melinda Jimenez is a 34 y.o. female.  34 y/o f with PMH anxiety, GERD, anemia presenting to the emergency department for concern of dehydration.  Patient reports that she had 2 episodes of emesis today.  Currently feels her normal self.  Also reports that she was concerned because last night she accidentally took 8.5 mg of Risperdal which was her son's.  She was trying to take a stool softener instead.  Currently she feels her normal self        Past Medical History:  Diagnosis Date  . Anemia   . Anxiety    off meds with preg  . Carpal tunnel syndrome   . Chlamydia   . Genital HSV    Last OB: > 22yr ago; Valtrex 500 mg po prn outbreaks  . GERD (gastroesophageal reflux disease)   . Gestational diabetes    diet controlled  . Headache(784.0)   . IBS (irritable bowel syndrome)   . Infection    UTI  . Mood swings   . Panic attack   . Thyroid disease   . Trichomonas     Patient Active Problem List   Diagnosis Date Noted  . H/O: cesarean section 09/27/2016  . GERD without esophagitis 01/20/2013  . Anxiety and depression 01/20/2013  . CARPAL TUNNEL SYNDROME 06/02/2010  . Vitamin D deficiency 04/19/2010  . Herpes genitalis in women 04/18/2010  . Goiter, unspecified 04/18/2010    Past Surgical History:  Procedure Laterality Date  . CESAREAN SECTION N/A 05/20/2013   Procedure: CESAREAN SECTION;  Surgeon: Shelly Bombard, MD;  Location: [facility];  Service: Obstetrics;  Laterality: N/A;  . CESAREAN SECTION N/A 04/06/2017   Procedure: CESAREAN SECTION;  Surgeon: Chancy Milroy, MD;  Location: [facility];  Service: Obstetrics;  Laterality: N/A;  . INDUCED ABORTION  2009  . VAGINAL DELIVERY  2007     OB History    Gravida  9   Para  4   Term  3   Preterm   1   AB  5   Living  3     SAB  2   TAB  3   Ectopic      Multiple  0   Live Births  3           Family History  Problem Relation Age of Onset  . Depression Mother   . Anxiety disorder Mother   . Breast cancer Other   . Bipolar disorder Brother   . Diabetes Maternal Grandmother   . Cancer Maternal Grandmother   . Hypertension Maternal Grandmother   . Hypertension Paternal Grandmother   . Autism Other   . Schizophrenia Other     Social History   Tobacco Use  . Smoking status: Passive Smoke Exposure - Never Smoker  . Smokeless tobacco: Never Used  . Tobacco comment: was not a daily smoker, tried it  Substance Use Topics  . Alcohol use: No    Comment: Twice a month  . Drug use: No    Home Medications Prior to Admission medications   Medication Sig Start Date End Date Taking? Authorizing Provider  ALPRAZolam Duanne Moron) 0.5 MG tablet Take 1 tablet (0.5 mg total) by mouth at bedtime as needed for anxiety. 01/18/20  Arvilla Market, DO  fluticasone (FLONASE) 50 MCG/ACT nasal spray Place 1 spray into both nostrils daily. Use for 2-3 weeks to help with the nasal congestion and ear pain Patient not taking: Reported on 01/21/2020 01/20/20   Gwyneth Sprout, MD  predniSONE (DELTASONE) 20 MG tablet Take 2 tablets (40 mg total) by mouth daily. 01/20/20   Gwyneth Sprout, MD  sertraline (ZOLOFT) 25 MG tablet Take one tablet daily. If tolerating, increase to two tablets in one week. Patient not taking: Reported on 01/21/2020 01/18/20   Arvilla Market, DO  zolpidem (AMBIEN) 5 MG tablet Take 1 tablet (5 mg total) by mouth at bedtime as needed for sleep. Patient not taking: Reported on 01/21/2020 01/20/20   Gwyneth Sprout, MD    Allergies    Zofran Frazier Richards hcl] and Latex  Review of Systems   Review of Systems  Constitutional: Negative for chills and fever.  HENT: Negative for ear pain and sore throat.   Eyes: Negative for pain and visual  disturbance.  Respiratory: Negative for cough and shortness of breath.   Cardiovascular: Negative for chest pain and palpitations.  Gastrointestinal: Positive for nausea and vomiting. Negative for abdominal pain.  Genitourinary: Negative for dysuria and hematuria.  Musculoskeletal: Negative for arthralgias and back pain.  Skin: Negative for color change and rash.  Neurological: Negative for seizures and syncope.  All other systems reviewed and are negative.   Physical Exam Updated Vital Signs BP 103/79 (BP Location: Left Arm)   Pulse 90   Temp 98.5 F (36.9 C) (Oral)   Resp 18   Ht 5\' 8"  (1.727 m)   Wt 108.9 kg   LMP 01/07/2020   SpO2 100%   BMI 36.49 kg/m   Physical Exam Vitals and nursing note reviewed.  Constitutional:      Appearance: Normal appearance.  HENT:     Head: Normocephalic.     Mouth/Throat:     Mouth: Mucous membranes are moist.  Eyes:     Conjunctiva/sclera: Conjunctivae normal.     Pupils: Pupils are equal, round, and reactive to light.  Cardiovascular:     Rate and Rhythm: Normal rate and regular rhythm.  Pulmonary:     Effort: Pulmonary effort is normal.     Breath sounds: No wheezing, rhonchi or rales.  Chest:     Chest wall: No tenderness.  Skin:    General: Skin is dry.  Neurological:     Mental Status: She is alert.  Psychiatric:        Mood and Affect: Mood normal.     ED Results / Procedures / Treatments   Labs (all labs ordered are listed, but only abnormal results are displayed) Labs Reviewed  CBC - Abnormal; Notable for the following components:      Result Value   WBC 11.8 (*)    Hemoglobin 11.3 (*)    MCV 76.9 (*)    MCH 23.5 (*)    RDW 17.2 (*)    All other components within normal limits  URINALYSIS, ROUTINE W REFLEX MICROSCOPIC - Abnormal; Notable for the following components:   Protein, ur 30 (*)    All other components within normal limits  LIPASE, BLOOD  COMPREHENSIVE METABOLIC PANEL  I-STAT BETA HCG BLOOD, ED  ([facility], [facility], [facility] ONLY)    EKG None  Radiology No results found.  Procedures Procedures (including critical care time)  Medications Ordered in ED Medications - No data to display  ED Course  I have reviewed  the triage vital signs and the nursing notes.  Pertinent labs & imaging results that were available during my care of the patient were reviewed by me and considered in my medical decision making (see chart for details).  Clinical Course as of Feb 02 1317  Wed Feb 03, 2020  1316 Patient with 2 episodes of emesis and taking a Risperdal pill which was not hers last night on accident.  Overall well-appearing with normal vitals and reassuring labs.  Currently feels normal and declined any Zofran or other medications today.  Discharge home with primary doctor follow-up.  Advised on return precautions.   [KM]    Clinical Course User Index [KM] Jeral Pinch   MDM Rules/Calculators/A&P                      Based on review of vitals, medical screening exam, lab work and/or imaging, there does not appear to be an acute, emergent etiology for the patient's symptoms. Counseled pt on good return precautions and encouraged both PCP and ED follow-up as needed.  Prior to discharge, I also discussed incidental imaging findings with patient in detail and advised appropriate, recommended follow-up in detail.  Clinical Impression: 1. Non-intractable vomiting with nausea, unspecified vomiting type     Disposition: Discharge  Prior to providing a prescription for a controlled substance, I independently reviewed the patient's recent prescription history on the [place] Controlled Substance Reporting System. The patient had no recent or regular prescriptions and was deemed appropriate for a brief, less than 3 day prescription of narcotic for acute analgesia.  This note was prepared with assistance of Conservation officer, historic buildings. Occasional wrong-word or sound-a-like substitutions  may have occurred due to the inherent limitations of voice recognition software.  Final Clinical Impression(s) / ED Diagnoses Final diagnoses:  Non-intractable vomiting with nausea, unspecified vomiting type    Rx / DC Orders ED Discharge Orders    None       Jeral Pinch 02/03/20 1318    Terald Sleeper, MD 02/03/20 [phone]

## 2020-02-03 NOTE — Discharge Instructions (Addendum)
Your labs are very reassuring and it does not appear that you have any metabolic problems or dehydration today.  Continue to drink plenty of fluids at home.  Thank you for allowing me to care for you today. Please return to the emergency department if you have new or worsening symptoms. Take your medications as instructed.

## 2020-02-03 NOTE — ED Notes (Signed)
PT left prior to receiving discharge instructions and obtaining last set of vitals

## 2020-02-05 ENCOUNTER — Emergency Department (HOSPITAL_COMMUNITY)
Admission: EM | Admit: 2020-02-05 | Discharge: 2020-02-06 | Disposition: A | Discharge status: Home / Self Care | Payer: Medicaid Other | Attending: Emergency Medicine | Admitting: Emergency Medicine

## 2020-02-05 ENCOUNTER — Emergency Department (HOSPITAL_COMMUNITY)
Admission: EM | Admit: 2020-02-05 | Discharge: 2020-02-05 | Discharge status: Against Medical Advice | Payer: Medicaid Other

## 2020-02-05 ENCOUNTER — Ambulatory Visit (HOSPITAL_COMMUNITY)
Admission: RE | Admit: 2020-02-05 | Discharge: 2020-02-05 | Disposition: A | Discharge status: Home / Self Care | Payer: Medicaid Other | Attending: Psychiatry | Admitting: Psychiatry

## 2020-02-05 ENCOUNTER — Other Ambulatory Visit: Payer: Self-pay

## 2020-02-05 DIAGNOSIS — F419 Anxiety disorder, unspecified: Secondary | ICD-10-CM | POA: Insufficient documentation

## 2020-02-05 DIAGNOSIS — O24430 Gestational diabetes mellitus in the puerperium, diet controlled: Secondary | ICD-10-CM | POA: Diagnosis not present

## 2020-02-05 DIAGNOSIS — Z87891 Personal history of nicotine dependence: Secondary | ICD-10-CM | POA: Insufficient documentation

## 2020-02-05 DIAGNOSIS — R450 Nervousness: Secondary | ICD-10-CM | POA: Insufficient documentation
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
## 2020-02-05 NOTE — ED Triage Notes (Signed)
Arrived by EMS from home. Patient here for psych evaluation.

## 2020-02-05 NOTE — ED Notes (Signed)
Called for triage no answer  

## 2020-02-06 ENCOUNTER — Emergency Department (HOSPITAL_COMMUNITY)
Admission: EM | Admit: 2020-02-06 | Discharge: 2020-02-06 | Disposition: A | Discharge status: Home / Self Care | Payer: Medicaid Other | Source: Home / Self Care

## 2020-02-06 ENCOUNTER — Encounter (HOSPITAL_COMMUNITY): Payer: Self-pay | Admitting: Emergency Medicine

## 2020-02-06 ENCOUNTER — Other Ambulatory Visit: Payer: Self-pay

## 2020-02-06 ENCOUNTER — Encounter (HOSPITAL_COMMUNITY): Payer: Self-pay

## 2020-02-06 ENCOUNTER — Emergency Department (HOSPITAL_COMMUNITY): Payer: Medicaid Other

## 2020-02-06 ENCOUNTER — Emergency Department (HOSPITAL_COMMUNITY)
Admission: EM | Admit: 2020-02-06 | Discharge: 2020-02-06 | Disposition: A | Discharge status: Home / Self Care | Payer: Medicaid Other | Source: Home / Self Care | Attending: Emergency Medicine | Admitting: Emergency Medicine

## 2020-02-06 DIAGNOSIS — Z91040 Latex allergy status: Secondary | ICD-10-CM | POA: Insufficient documentation

## 2020-02-06 DIAGNOSIS — K5901 Slow transit constipation: Secondary | ICD-10-CM

## 2020-02-06 DIAGNOSIS — R079 Chest pain, unspecified: Secondary | ICD-10-CM | POA: Insufficient documentation

## 2020-02-06 DIAGNOSIS — R109 Unspecified abdominal pain: Secondary | ICD-10-CM | POA: Insufficient documentation

## 2020-02-06 DIAGNOSIS — K5900 Constipation, unspecified: Secondary | ICD-10-CM | POA: Insufficient documentation

## 2020-02-06 DIAGNOSIS — Z5321 Procedure and treatment not carried out due to patient leaving prior to being seen by health care provider: Secondary | ICD-10-CM | POA: Insufficient documentation

## 2020-02-06 DIAGNOSIS — Z7722 Contact with and (suspected) exposure to environmental tobacco smoke (acute) (chronic): Secondary | ICD-10-CM | POA: Insufficient documentation

## 2020-02-06 DIAGNOSIS — H9202 Otalgia, left ear: Secondary | ICD-10-CM | POA: Insufficient documentation

## 2020-02-06 LAB — CBC
HCT: 38.3 % (ref 36.0–46.0)
Hemoglobin: 11.3 g/dL — ABNORMAL LOW (ref 12.0–15.0)
MCH: 23 pg — ABNORMAL LOW (ref 26.0–34.0)
MCHC: 29.5 g/dL — ABNORMAL LOW (ref 30.0–36.0)
MCV: 77.8 fL — ABNORMAL LOW (ref 80.0–100.0)
Platelets: 415 10*3/uL — ABNORMAL HIGH (ref 150–400)
RBC: 4.92 MIL/uL (ref 3.87–5.11)
RDW: 17.3 % — ABNORMAL HIGH (ref 11.5–15.5)
WBC: 14.2 10*3/uL — ABNORMAL HIGH (ref 4.0–10.5)
nRBC: 0 % (ref 0.0–0.2)

## 2020-02-06 LAB — BASIC METABOLIC PANEL
Anion gap: 14 (ref 5–15)
BUN: 5 mg/dL — ABNORMAL LOW (ref 6–20)
CO2: 23 mmol/L (ref 22–32)
Calcium: 9.4 mg/dL (ref 8.9–10.3)
Chloride: 101 mmol/L (ref 98–111)
Creatinine, Ser: 0.63 mg/dL (ref 0.44–1.00)
GFR calc Af Amer: >60 mL/min (ref >60)
GFR calc non Af Amer: >60 mL/min (ref >60)
Glucose, Bld: 110 mg/dL — ABNORMAL HIGH (ref 70–99)
Potassium: 3.5 mmol/L (ref 3.5–5.1)
Sodium: 138 mmol/L (ref 135–145)

## 2020-02-06 LAB — I-STAT BETA HCG BLOOD, ED (MC, WL, AP ONLY): I-stat hCG, quantitative: <5 m[IU]/mL (ref <5)

## 2020-02-06 LAB — TROPONIN I (HIGH SENSITIVITY): Troponin I (High Sensitivity): 6 ng/L (ref <18)

## 2020-02-06 MED ORDER — SODIUM CHLORIDE 0.9% FLUSH
3.0000 mL | Freq: Once | INTRAVENOUS | Status: AC
  Administered 2020-02-06: 3 mL via INTRAVENOUS

## 2020-02-06 MED ORDER — SODIUM CHLORIDE 0.9 % IV BOLUS
1000.0000 mL | Freq: Once | INTRAVENOUS | Status: AC
  Administered 2020-02-06: 1000 mL via INTRAVENOUS

## 2020-02-06 MED ORDER — POLYETHYLENE GLYCOL 3350 17 G PO PACK: 17.0000 g | PACK | Freq: Every day | ORAL | 0 refills | Status: DC

## 2020-02-06 NOTE — ED Provider Notes (Signed)
[facility] EMERGENCY DEPARTMENT Provider Note   CSN: [id number] Arrival date & time: 02/06/20  2376     History Chief Complaint  Patient presents with  . Chest Pain  . Constipation    Melinda Jimenez is a 34 y.o. female with a past medical history of GERD, IBS presenting to the ED with a chief complaint of abdominal pain and constipation.  States that she "does not boo-boo that much" and is concerned that her last bowel movement was 2 days ago.  She tried taking a laxative with only minimal improvement in her symptoms.  She does feel that she is dehydrated and that is what is causing her to have constipation.  She denies any vomiting, dysuria, hematuria, possibility of pregnancy, bloody stools.  Of note triage note states that patient is having chest pain but patient denies this to me today.  Denies any cough, shortness of breath.  HPI     Past Medical History:  Diagnosis Date  . Anemia   . Anxiety    off meds with preg  . Carpal tunnel syndrome   . Chlamydia   . Genital HSV    Last OB: > 26yr ago; Valtrex 500 mg po prn outbreaks  . GERD (gastroesophageal reflux disease)   . Gestational diabetes    diet controlled  . Headache(784.0)   . IBS (irritable bowel syndrome)   . Infection    UTI  . Mood swings   . Panic attack   . Thyroid disease   . Trichomonas     Patient Active Problem List   Diagnosis Date Noted  . H/O: cesarean section 09/27/2016  . GERD without esophagitis 01/20/2013  . Anxiety and depression 01/20/2013  . CARPAL TUNNEL SYNDROME 06/02/2010  . Vitamin D deficiency 04/19/2010  . Herpes genitalis in women 04/18/2010  . Goiter, unspecified 04/18/2010    Past Surgical History:  Procedure Laterality Date  . CESAREAN SECTION N/A 05/20/2013   Procedure: CESAREAN SECTION;  Surgeon: Brock Bad, MD;  Location: [facility];  Service: Obstetrics;  Laterality: N/A;  . CESAREAN SECTION N/A 04/06/2017   Procedure: CESAREAN SECTION;  Surgeon:  Hermina Staggers, MD;  Location: [facility] BIRTHING SUIT[facility];  Service: Obstetrics;  Laterality: N/A;  . INDUCED ABORTION  2009  . VAGINAL DELIVERY  2007     OB History    Gravida  9   Para  4   Term  3   Preterm  1   AB  5   Living  3     SAB  2   TAB  3   Ectopic      Multiple  0   Live Births  3           Family History  Problem Relation Age of Onset  . Depression Mother   . Anxiety disorder Mother   . Breast cancer Other   . Bipolar disorder Brother   . Diabetes Maternal Grandmother   . Cancer Maternal Grandmother   . Hypertension Maternal Grandmother   . Hypertension Paternal Grandmother   . Autism Other   . Schizophrenia Other     Social History   Tobacco Use  . Smoking status: Passive Smoke Exposure - Never Smoker  . Smokeless tobacco: Never Used  . Tobacco comment: was not a daily smoker, tried it  Substance Use Topics  . Alcohol use: No    Comment: Twice a month  . Drug use: No    Home  Medications Prior to Admission medications   Medication Sig Start Date End Date Taking? Authorizing Provider  ALPRAZolam Prudy Feeler) 0.5 MG tablet Take 1 tablet (0.5 mg total) by mouth at bedtime as needed for anxiety. 01/18/20   Arvilla Market, DO  fluticasone (FLONASE) 50 MCG/ACT nasal spray Place 1 spray into both nostrils daily. Use for 2-3 weeks to help with the nasal congestion and ear pain Patient not taking: Reported on 01/21/2020 01/20/20   Gwyneth Sprout, MD  polyethylene glycol (MIRALAX / GLYCOLAX) 17 g packet Take 17 g by mouth daily. 02/06/20   Haylea Schlichting, PA-C  predniSONE (DELTASONE) 20 MG tablet Take 2 tablets (40 mg total) by mouth daily. 01/20/20   Gwyneth Sprout, MD  sertraline (ZOLOFT) 25 MG tablet Take one tablet daily. If tolerating, increase to two tablets in one week. Patient not taking: Reported on 01/21/2020 01/18/20   Arvilla Market, DO  zolpidem (AMBIEN) 5 MG tablet Take 1 tablet (5 mg total) by mouth at bedtime as needed  for sleep. Patient not taking: Reported on 01/21/2020 01/20/20   Gwyneth Sprout, MD    Allergies    Zofran Frazier Richards hcl] and Latex  Review of Systems   Review of Systems  Constitutional: Negative for appetite change, chills and fever.  HENT: Negative for ear pain, rhinorrhea, sneezing and sore throat.   Eyes: Negative for photophobia and visual disturbance.  Respiratory: Negative for cough, chest tightness, shortness of breath and wheezing.   Cardiovascular: Negative for chest pain and palpitations.  Gastrointestinal: Positive for abdominal pain and constipation. Negative for blood in stool, diarrhea, nausea and vomiting.  Genitourinary: Negative for dysuria, hematuria and urgency.  Musculoskeletal: Negative for myalgias.  Skin: Negative for rash.  Neurological: Negative for dizziness, weakness and light-headedness.    Physical Exam Updated Vital Signs BP (!) 155/106   Pulse 97   Temp 98.9 F (37.2 C) (Oral)   Resp (!) 22   LMP 01/07/2020   SpO2 100%   Physical Exam Vitals and nursing note reviewed.  Constitutional:      General: She is not in acute distress.    Appearance: She is well-developed.  HENT:     Head: Normocephalic and atraumatic.     Nose: Nose normal.  Eyes:     General: No scleral icterus.       Left eye: No discharge.     Conjunctiva/sclera: Conjunctivae normal.  Cardiovascular:     Rate and Rhythm: Normal rate and regular rhythm.     Heart sounds: Normal heart sounds. No murmur. No friction rub. No gallop.   Pulmonary:     Effort: Pulmonary effort is normal. No respiratory distress.     Breath sounds: Normal breath sounds.  Abdominal:     General: Bowel sounds are normal. There is no distension.     Palpations: Abdomen is soft.     Tenderness: There is no abdominal tenderness. There is no guarding.  Musculoskeletal:        General: Normal range of motion.     Cervical back: Normal range of motion and neck supple.  Skin:    General: Skin  is warm and dry.     Findings: No rash.  Neurological:     Mental Status: She is alert.     Motor: No abnormal muscle tone.     Coordination: Coordination normal.     ED Results / Procedures / Treatments   Labs (all labs ordered are listed, but only abnormal results are  displayed) Labs Reviewed  BASIC METABOLIC PANEL - Abnormal; Notable for the following components:      Result Value   Glucose, Bld 110 (*)    BUN 5 (*)    All other components within normal limits  CBC - Abnormal; Notable for the following components:   WBC 14.2 (*)    Hemoglobin 11.3 (*)    MCV 77.8 (*)    MCH 23.0 (*)    MCHC 29.5 (*)    RDW 17.3 (*)    Platelets 415 (*)    All other components within normal limits  I-STAT BETA HCG BLOOD, ED ([facility], [facility], [facility] ONLY)  TROPONIN I (HIGH SENSITIVITY)    EKG EKG Interpretation  Date/Time:  Saturday Feb 06 2020 10:02:23 EDT Ventricular Rate:  89 PR Interval:  154 QRS Duration: 86 QT Interval:  346 QTC Calculation: 420 R Axis:   57 Text Interpretation: Normal sinus rhythm Normal ECG Confirmed by Marianna Fuss ([id number]) on 02/06/2020 11:04:41 AM   Radiology DG Chest 2 View  Result Date: 02/06/2020 CLINICAL DATA:  Right-sided chest pain beginning this morning. EXAM: CHEST - 2 VIEW COMPARISON:  01/16/2015 FINDINGS: The heart size and mediastinal contours are within normal limits. Both lungs are clear. The visualized skeletal structures are unremarkable. IMPRESSION: Normal study. Electronically Signed   By: Danae Orleans M.D.   On: 02/06/2020 10:33    Procedures Procedures (including critical care time)  Medications Ordered in ED Medications  sodium chloride flush (NS) 0.9 % injection 3 mL (3 mLs Intravenous Given 02/06/20 1134)  sodium chloride 0.9 % bolus 1,000 mL (1,000 mLs Intravenous New Bag/Given 02/06/20 1134)    ED Course  I have reviewed the triage vital signs and the nursing notes.  Pertinent labs & imaging results that were available during my care of  the patient were reviewed by me and considered in my medical decision making (see chart for details).    MDM Rules/Calculators/A&P                      33 year old female seen and evaluated yesterday for similar symptoms presenting to the ED for complaints of constipation.  Minimal improvement noted with over-the-counter laxative.  She is also concerned that she is dehydrated.  Denies bloody stools, urinary symptoms, vomiting.  On exam patient's abdomen is soft, nontender nondistended.  She denies chest pain to me.  She is not tachycardic, tachypneic or hypoxic.  EKG shows normal sinus rhythm, BMP, CBC and troponin unremarkable.  Chest x-ray without any acute findings.  Patient able to pass gas without difficulty.  Suspect that she is suffering from slow transit constipation.  Doubt obstruction as she has been moving her bowels and is continuing to pass gas, abdomen is soft, nontender nondistended.  Will prescribe MiraLAX and have her follow-up with PCP.  All imaging, if done today, including plain films, CT scans, and ultrasounds, independently reviewed by me, and interpretations confirmed via formal radiology reads.  Patient is hemodynamically stable, in NAD, and able to ambulate in the ED. Evaluation does not show pathology that would require ongoing emergent intervention or inpatient treatment. I explained the diagnosis to the patient. Pain has been managed and has no complaints prior to discharge. Patient is comfortable with above plan and is stable for discharge at this time. All questions were answered prior to disposition. Strict return precautions for returning to the ED were discussed. Encouraged follow up with PCP.   An After Visit Summary was  printed and given to the patient.   Portions of this note were generated with [employer]. Dictation errors may occur despite best attempts at proofreading.  Final Clinical Impression(s) / ED Diagnoses Final diagnoses:  Slow transit  constipation    Rx / DC Orders ED Discharge Orders         Ordered    polyethylene glycol (MIRALAX / GLYCOLAX) 17 g packet  Daily     02/06/20 1313           Delia Heady, PA-C 02/06/20 1318    Lucrezia Starch, MD 02/07/20 1735

## 2020-02-06 NOTE — Discharge Instructions (Addendum)
Continue your home medications as previously prescribed. Use of MiraLAX to help with your constipation. Follow-up with your primary care provider. Return to the ER for any worsening abdominal pain, chest pain, shortness of breath, numbness in arms or legs.

## 2020-02-06 NOTE — ED Notes (Addendum)
Patient verbalizes understanding of discharge instructions. Opportunity for questioning and answers were provided. Armband removed by staff, pt discharged from ED.  

## 2020-02-06 NOTE — H&P (Signed)
Behavioral Health Medical Screening Exam  Melinda Jimenez is an 34 y.o. female.  Total Time spent with patient: 15 minutes  Psychiatric Specialty Exam: Physical Exam  Constitutional: She is oriented to person, place, and time. She appears well-developed and well-nourished. No distress.  Respiratory: Effort normal. No respiratory distress.  Musculoskeletal:        General: Normal range of motion.  Neurological: She is alert and oriented to person, place, and time.  Skin: She is not diaphoretic.  Psychiatric: Her mood appears anxious. She is not withdrawn and not actively hallucinating. Thought content is not paranoid and not delusional. She expresses no homicidal and no suicidal ideation.    Review of Systems  Constitutional: Negative for activity change, appetite change, chills, diaphoresis, fatigue, fever and unexpected weight change.  Respiratory: Negative for cough and shortness of breath.   Cardiovascular: Negative for chest pain.  Gastrointestinal: Negative for diarrhea, nausea and vomiting.  Psychiatric/Behavioral: Negative for suicidal ideas. The patient is nervous/anxious.   All other systems reviewed and are negative.   Blood pressure (!) 125/96, pulse (!) 116, temperature 98.6 F (37 C), temperature source Oral, resp. rate 18, last menstrual period 01/07/2020, SpO2 100 %.There is no height or weight on file to calculate BMI.  General Appearance: Casual  Eye Contact:  Fair  Speech:  Clear and Coherent and Normal Rate  Volume:  Normal  Mood:  Anxious  Affect:  Congruent  Thought Process:  Coherent  Orientation:  Full (Time, Place, and Person)  Thought Content:  Logical  Suicidal Thoughts:  No  Homicidal Thoughts:  No  Memory:  Immediate;   Fair Recent;   Fair Remote;   Fair  Judgement:  Fair  Insight:  Fair  Psychomotor Activity:  Restlessness  Concentration: Concentration: Fair and Attention Span: Fair  Recall:  Good  Fund of Knowledge:Good  Language: Good   Akathisia:  Negative  Handed:  Right  AIMS (if indicated):     Assets:  Communication Skills Leisure Time Physical Health  Sleep:       Musculoskeletal: Strength & Muscle Tone: within normal limits Gait & Station: normal Patient leans: N/A  Blood pressure (!) 125/96, pulse (!) 116, temperature 98.6 F (37 C), temperature source Oral, resp. rate 18, last menstrual period 01/07/2020, SpO2 100 %.  Recommendations:  Based on my evaluation the patient does not appear to have an emergency medical condition.   Disposition: No evidence of imminent risk to self or others at present.   Patient does not meet criteria for psychiatric inpatient admission. Supportive therapy provided about ongoing stressors. Discussed crisis plan, support from social network, calling 911, coming to the Emergency Department, and calling Suicide Hotline. Patient requests outpatient resources.   Jackelyn Poling, NP 02/06/2020, 5:27 AM

## 2020-02-06 NOTE — ED Provider Notes (Signed)
[facility] DEPT Provider Note   CSN: [id number] Arrival date & time: 02/05/20  2327     History Chief Complaint  Patient presents with  . Medical Clearance    Melinda Jimenez is a 34 y.o. female.  coinm      34 year old female with anxiety?.  Patient states that she was at home when her throat began to feel very dry as of she inhaled a large amount of dust.  She felt like she was choking.  She went inside to get something to drink but did not feel any better after doing so.  She called EMS and they transported here to the emergency room.  Currently feeling better. Denies SI or HI. "I don't believe in that." Denies hallucinations. Denies etoh or drugs.   Past Medical History:  Diagnosis Date  . Anemia   . Anxiety    off meds with preg  . Carpal tunnel syndrome   . Chlamydia   . Genital HSV    Last OB: > 58yr ago; Valtrex 500 mg po prn outbreaks  . GERD (gastroesophageal reflux disease)   . Gestational diabetes    diet controlled  . Headache(784.0)   . IBS (irritable bowel syndrome)   . Infection    UTI  . Mood swings   . Panic attack   . Thyroid disease   . Trichomonas     Patient Active Problem List   Diagnosis Date Noted  . H/O: cesarean section 09/27/2016  . GERD without esophagitis 01/20/2013  . Anxiety and depression 01/20/2013  . CARPAL TUNNEL SYNDROME 06/02/2010  . Vitamin D deficiency 04/19/2010  . Herpes genitalis in women 04/18/2010  . Goiter, unspecified 04/18/2010    Past Surgical History:  Procedure Laterality Date  . CESAREAN SECTION N/A 05/20/2013   Procedure: CESAREAN SECTION;  Surgeon: Shelly Bombard, MD;  Location: [facility];  Service: Obstetrics;  Laterality: N/A;  . CESAREAN SECTION N/A 04/06/2017   Procedure: CESAREAN SECTION;  Surgeon: Chancy Milroy, MD;  Location: [facility];  Service: Obstetrics;  Laterality: N/A;  . INDUCED ABORTION  2009  . VAGINAL DELIVERY  2007     OB History    Gravida  9    Para  4   Term  3   Preterm  1   AB  5   Living  3     SAB  2   TAB  3   Ectopic      Multiple  0   Live Births  3           Family History  Problem Relation Age of Onset  . Depression Mother   . Anxiety disorder Mother   . Breast cancer Other   . Bipolar disorder Brother   . Diabetes Maternal Grandmother   . Cancer Maternal Grandmother   . Hypertension Maternal Grandmother   . Hypertension Paternal Grandmother   . Autism Other   . Schizophrenia Other     Social History   Tobacco Use  . Smoking status: Passive Smoke Exposure - Never Smoker  . Smokeless tobacco: Never Used  . Tobacco comment: was not a daily smoker, tried it  Substance Use Topics  . Alcohol use: No    Comment: Twice a month  . Drug use: No    Home Medications Prior to Admission medications   Medication Sig Start Date End Date Taking? Authorizing Provider  ALPRAZolam Duanne Moron) 0.5 MG tablet Take 1 tablet (0.5 mg  total) by mouth at bedtime as needed for anxiety. 01/18/20   Arvilla Market, DO  fluticasone (FLONASE) 50 MCG/ACT nasal spray Place 1 spray into both nostrils daily. Use for 2-3 weeks to help with the nasal congestion and ear pain Patient not taking: Reported on 01/21/2020 01/20/20   Gwyneth Sprout, MD  predniSONE (DELTASONE) 20 MG tablet Take 2 tablets (40 mg total) by mouth daily. 01/20/20   Gwyneth Sprout, MD  sertraline (ZOLOFT) 25 MG tablet Take one tablet daily. If tolerating, increase to two tablets in one week. Patient not taking: Reported on 01/21/2020 01/18/20   Arvilla Market, DO  zolpidem (AMBIEN) 5 MG tablet Take 1 tablet (5 mg total) by mouth at bedtime as needed for sleep. Patient not taking: Reported on 01/21/2020 01/20/20   Gwyneth Sprout, MD    Allergies    Zofran Frazier Richards hcl] and Latex  Review of Systems   Review of Systems All systems reviewed and negative, other than as noted in HPI.  Physical Exam Updated Vital Signs BP  (!) 152/92 (BP Location: Left Arm)   Pulse 99   Temp 98.4 F (36.9 C) (Oral)   Resp 18   Ht 5\' 8"  (1.727 m)   Wt 108.9 kg   LMP 01/07/2020   SpO2 97%   BMI 36.49 kg/m   Physical Exam Vitals and nursing note reviewed.  Constitutional:      General: She is not in acute distress.    Appearance: She is well-developed.  HENT:     Head: Normocephalic and atraumatic.  Eyes:     General:        Right eye: No discharge.        Left eye: No discharge.     Conjunctiva/sclera: Conjunctivae normal.  Cardiovascular:     Rate and Rhythm: Normal rate and regular rhythm.     Heart sounds: Normal heart sounds. No murmur. No friction rub. No gallop.   Pulmonary:     Effort: Pulmonary effort is normal. No respiratory distress.     Breath sounds: Normal breath sounds.  Abdominal:     General: There is no distension.     Palpations: Abdomen is soft.     Tenderness: There is no abdominal tenderness.  Musculoskeletal:        General: No tenderness.     Cervical back: Neck supple.  Skin:    General: Skin is warm and dry.  Neurological:     Mental Status: She is alert.  Psychiatric:     Comments: Anxious. Cooperative. Odd affect. Doesn't appear to be responding to internal stimuli.      ED Results / Procedures / Treatments   Labs (all labs ordered are listed, but only abnormal results are displayed) Labs Reviewed - No data to display  EKG None  Radiology No results found.  Procedures Procedures (including critical care time)  Medications Ordered in ED Medications - No data to display  ED Course  I have reviewed the triage vital signs and the nursing notes.  Pertinent labs & imaging results that were available during my care of the patient were reviewed by me and considered in my medical decision making (see chart for details).    MDM Rules/Calculators/A&P                      34 year old female with anxiety. Currently feeling better. She denies SI or HI. Denies  hallucinations. She has a peculiar affect but doesn't seem distressed  or responding to internal stimuli. She says she wants to go home. Outpt FU.    Final Clinical Impression(s) / ED Diagnoses Final diagnoses:  Anxiety    Rx / DC Orders ED Discharge Orders    None       Raeford Razor, MD 02/08/20 1152

## 2020-02-06 NOTE — ED Triage Notes (Signed)
Pt discharged from [facility] ED today for constipation.  Pt went home, her mother wouldn't come to the door so she called 911 for "not feeling well".  Denies SI/HI.

## 2020-02-06 NOTE — ED Triage Notes (Signed)
Pt states she went to [facility] last night for sob, headache, and R neck pain. She states that she doesn't think they gave her any pain medicine but she felt confused after leaving so she went back this morning. She was discharged this morning and states she started "twitching" In her arms and legs and began having chest pain.

## 2020-02-06 NOTE — ED Triage Notes (Signed)
Pt reports that her L ear "feels strange." Pt states that she did not want to come here. EMS stated that she started yelling in the truck that she did not want to come. Pt is calm at this time and has chosen to check in for this "strange feeling."

## 2020-02-07 DIAGNOSIS — F4325 Adjustment disorder with mixed disturbance of emotions and conduct: Secondary | ICD-10-CM | POA: Insufficient documentation

## 2020-02-08 DIAGNOSIS — R519 Headache, unspecified: Secondary | ICD-10-CM | POA: Diagnosis not present

## 2020-02-08 DIAGNOSIS — R040 Epistaxis: Secondary | ICD-10-CM | POA: Diagnosis not present

## 2020-02-08 DIAGNOSIS — Z5321 Procedure and treatment not carried out due to patient leaving prior to being seen by health care provider: Secondary | ICD-10-CM | POA: Diagnosis not present

## 2020-02-08 MED ORDER — SERTRALINE HCL 25 MG PO TABS
25.00 | ORAL_TABLET | ORAL | Status: DC
Start: 2020-02-09 — End: 2020-02-08

## 2020-02-08 MED ORDER — POLYETHYLENE GLYCOL 3350 17 GM/SCOOP PO POWD
17.00 | ORAL | Status: DC
Start: ? — End: 2020-02-08

## 2020-02-08 MED ORDER — GENERIC EXTERNAL MEDICATION
Status: DC
Start: ? — End: 2020-02-08

## 2020-02-08 MED ORDER — IBUPROFEN 600 MG PO TABS
600.00 | ORAL_TABLET | ORAL | Status: DC
Start: ? — End: 2020-02-08

## 2020-02-08 MED ORDER — ALUM & MAG HYDROXIDE-SIMETH 200-200-20 MG/5ML PO SUSP
30.00 | ORAL | Status: DC
Start: ? — End: 2020-02-08

## 2020-02-08 MED ORDER — HYDROXYZINE PAMOATE 50 MG PO CAPS
50.00 | ORAL_CAPSULE | ORAL | Status: DC
Start: ? — End: 2020-02-08

## 2020-02-08 MED ORDER — TRAZODONE HCL 50 MG PO TABS
50.00 | ORAL_TABLET | ORAL | Status: DC
Start: ? — End: 2020-02-08

## 2020-02-09 ENCOUNTER — Encounter (HOSPITAL_COMMUNITY): Payer: Self-pay | Admitting: Emergency Medicine

## 2020-02-09 ENCOUNTER — Emergency Department (HOSPITAL_COMMUNITY)
Admission: EM | Admit: 2020-02-09 | Discharge: 2020-02-09 | Disposition: A | Discharge status: Home / Self Care | Payer: Medicaid Other | Source: Home / Self Care | Attending: Emergency Medicine | Admitting: Emergency Medicine

## 2020-02-09 ENCOUNTER — Encounter (HOSPITAL_COMMUNITY): Payer: Self-pay

## 2020-02-09 ENCOUNTER — Other Ambulatory Visit: Payer: Self-pay

## 2020-02-09 ENCOUNTER — Emergency Department (HOSPITAL_COMMUNITY)
Admission: EM | Admit: 2020-02-09 | Discharge: 2020-02-09 | Disposition: A | Discharge status: Home / Self Care | Payer: Medicaid Other | Attending: Emergency Medicine | Admitting: Emergency Medicine

## 2020-02-09 ENCOUNTER — Emergency Department (HOSPITAL_COMMUNITY)
Admission: EM | Admit: 2020-02-09 | Discharge: 2020-02-10 | Disposition: A | Discharge status: Home / Self Care | Payer: Medicaid Other | Source: Home / Self Care | Attending: Emergency Medicine | Admitting: Emergency Medicine

## 2020-02-09 DIAGNOSIS — Z79899 Other long term (current) drug therapy: Secondary | ICD-10-CM | POA: Insufficient documentation

## 2020-02-09 DIAGNOSIS — R5383 Other fatigue: Secondary | ICD-10-CM | POA: Insufficient documentation

## 2020-02-09 DIAGNOSIS — Z5321 Procedure and treatment not carried out due to patient leaving prior to being seen by health care provider: Secondary | ICD-10-CM

## 2020-02-09 DIAGNOSIS — R519 Headache, unspecified: Secondary | ICD-10-CM | POA: Insufficient documentation

## 2020-02-09 DIAGNOSIS — Z91040 Latex allergy status: Secondary | ICD-10-CM | POA: Insufficient documentation

## 2020-02-09 MED ORDER — IBUPROFEN 200 MG PO TABS
400.0000 mg | ORAL_TABLET | Freq: Once | ORAL | Status: DC
  Filled 2020-02-09: qty 2

## 2020-02-09 NOTE — ED Notes (Signed)
No answer for room x 1 

## 2020-02-09 NOTE — ED Provider Notes (Signed)
[facility]-EMERGENCY DEPT Provider Note   CSN: [id number] Arrival date & time: 02/09/20  0308     History Chief Complaint  Patient presents with  . Headache    Melinda Jimenez is a 34 y.o. female.   Headache Pain location:  Generalized Quality:  Dull Radiates to:  Does not radiate Onset quality:  Gradual Duration:  2 days Timing:  Constant Progression:  Waxing and waning Chronicity:  Recurrent Similar to prior headaches: yes   Context: not activity, not defecating and not eating   Relieved by:  None tried Worsened by:  Nothing Ineffective treatments:  None tried Associated symptoms: no abdominal pain, no eye pain, no facial pain, no neck pain and no syncope        Past Medical History:  Diagnosis Date  . Anemia   . Anxiety    off meds with preg  . Carpal tunnel syndrome   . Chlamydia   . Genital HSV    Last OB: > 25yr ago; Valtrex 500 mg po prn outbreaks  . GERD (gastroesophageal reflux disease)   . Gestational diabetes    diet controlled  . Headache(784.0)   . IBS (irritable bowel syndrome)   . Infection    UTI  . Mood swings   . Panic attack   . Thyroid disease   . Trichomonas     Patient Active Problem List   Diagnosis Date Noted  . H/O: cesarean section 09/27/2016  . GERD without esophagitis 01/20/2013  . Anxiety and depression 01/20/2013  . CARPAL TUNNEL SYNDROME 06/02/2010  . Vitamin D deficiency 04/19/2010  . Herpes genitalis in women 04/18/2010  . Goiter, unspecified 04/18/2010    Past Surgical History:  Procedure Laterality Date  . CESAREAN SECTION N/A 05/20/2013   Procedure: CESAREAN SECTION;  Surgeon: Brock Bad, MD;  Location: [facility];  Service: Obstetrics;  Laterality: N/A;  . CESAREAN SECTION N/A 04/06/2017   Procedure: CESAREAN SECTION;  Surgeon: Hermina Staggers, MD;  Location: [facility];  Service: Obstetrics;  Laterality: N/A;  . INDUCED ABORTION  2009  . VAGINAL DELIVERY  2007     OB History     Gravida  9   Para  4   Term  3   Preterm  1   AB  5   Living  3     SAB  2   TAB  3   Ectopic      Multiple  0   Live Births  3           Family History  Problem Relation Age of Onset  . Depression Mother   . Anxiety disorder Mother   . Breast cancer Other   . Bipolar disorder Brother   . Diabetes Maternal Grandmother   . Cancer Maternal Grandmother   . Hypertension Maternal Grandmother   . Hypertension Paternal Grandmother   . Autism Other   . Schizophrenia Other     Social History   Tobacco Use  . Smoking status: Passive Smoke Exposure - Never Smoker  . Smokeless tobacco: Never Used  . Tobacco comment: was not a daily smoker, tried it  Substance Use Topics  . Alcohol use: No    Comment: Twice a month  . Drug use: No    Home Medications Prior to Admission medications   Medication Sig Start Date End Date Taking? Authorizing Provider  ALPRAZolam Prudy Feeler) 0.5 MG tablet Take 1 tablet (0.5 mg total) by mouth at bedtime  as needed for anxiety. 01/18/20   Arvilla Market, DO  fluticasone (FLONASE) 50 MCG/ACT nasal spray Place 1 spray into both nostrils daily. Use for 2-3 weeks to help with the nasal congestion and ear pain Patient not taking: Reported on 01/21/2020 01/20/20   Gwyneth Sprout, MD  polyethylene glycol (MIRALAX / GLYCOLAX) 17 g packet Take 17 g by mouth daily. 02/06/20   Khatri, Hina, PA-C  predniSONE (DELTASONE) 20 MG tablet Take 2 tablets (40 mg total) by mouth daily. 01/20/20   Gwyneth Sprout, MD  sertraline (ZOLOFT) 25 MG tablet Take one tablet daily. If tolerating, increase to two tablets in one week. Patient not taking: Reported on 01/21/2020 01/18/20   Arvilla Market, DO  zolpidem (AMBIEN) 5 MG tablet Take 1 tablet (5 mg total) by mouth at bedtime as needed for sleep. Patient not taking: Reported on 01/21/2020 01/20/20   Gwyneth Sprout, MD    Allergies    Zofran Frazier Richards hcl] and Latex  Review of Systems     Review of Systems  Eyes: Negative for pain.  Cardiovascular: Negative for syncope.  Gastrointestinal: Negative for abdominal pain.  Musculoskeletal: Negative for neck pain.  Neurological: Positive for headaches.  All other systems reviewed and are negative.   Physical Exam Updated Vital Signs BP (!) 140/101   Pulse (!) 114   Temp 99.7 F (37.6 C) (Oral)   Resp 16   Ht 5\' 8"  (1.727 m)   Wt 108.9 kg   SpO2 99%   BMI 36.49 kg/m   Physical Exam Vitals and nursing note reviewed.  Constitutional:      Appearance: She is well-developed.  HENT:     Head: Normocephalic and atraumatic.     Mouth/Throat:     Mouth: Mucous membranes are dry.     Pharynx: Oropharynx is clear.  Eyes:     Pupils: Pupils are equal, round, and reactive to light.  Cardiovascular:     Rate and Rhythm: Normal rate and regular rhythm.  Pulmonary:     Effort: No respiratory distress.     Breath sounds: No stridor.  Abdominal:     General: There is no distension.     Palpations: There is no mass.  Musculoskeletal:        General: No swelling or tenderness. Normal range of motion.     Cervical back: Normal range of motion.  Skin:    General: Skin is warm and dry.     Coloration: Skin is not jaundiced.  Neurological:     General: No focal deficit present.     Mental Status: She is alert and oriented to person, place, and time.     Cranial Nerves: No cranial nerve deficit or dysarthria.     Deep Tendon Reflexes: Reflexes normal.     ED Results / Procedures / Treatments   Labs (all labs ordered are listed, but only abnormal results are displayed) Labs Reviewed - No data to display  EKG None  Radiology No results found.  Procedures Procedures (including critical care time)  Medications Ordered in ED Medications  ibuprofen (ADVIL) tablet 400 mg (400 mg Oral Refused 02/09/20 04/10/20)    ED Course  I have reviewed the triage vital signs and the nursing notes.  Pertinent labs & imaging  results that were available during my care of the patient were reviewed by me and considered in my medical decision making (see chart for details).    MDM Rules/Calculators/A&P  Headache without obvious neurologic dysfunction. No indication for further workup in ED.   Final Clinical Impression(s) / ED Diagnoses Final diagnoses:  Acute nonintractable headache, unspecified headache type    Rx / DC Orders ED Discharge Orders    None       Erasmus Bistline, Corene Cornea, MD 02/09/20 [phone]

## 2020-02-09 NOTE — ED Triage Notes (Signed)
Pt c/o headache for 2 days. Pt recently seen for psych at [facility] for 3 days where she tested covid + 5/9.

## 2020-02-09 NOTE — ED Triage Notes (Signed)
Pt states she was seen at [facility] and tested +covid x 2 days ago. Pt states she doesn't feel well, has a headache and feels tired.

## 2020-02-09 NOTE — ED Triage Notes (Signed)
Per EMS, pt c/o headache and intermittent nosebleed since noon. Pt reported to EMS that she was recently discharged from [facility] after staying for 3 days for psych. Pt also reported testing positive for Covid, but then told she was negative

## 2020-02-09 NOTE — ED Notes (Signed)
Pt called for vital signs x3. No answer

## 2020-02-10 ENCOUNTER — Encounter (HOSPITAL_COMMUNITY): Payer: Self-pay

## 2020-02-10 ENCOUNTER — Emergency Department (HOSPITAL_COMMUNITY): Payer: Medicaid Other

## 2020-02-10 ENCOUNTER — Emergency Department (HOSPITAL_COMMUNITY)
Admission: EM | Admit: 2020-02-10 | Discharge: 2020-02-10 | Disposition: A | Discharge status: Home / Self Care | Payer: Medicaid Other | Attending: Emergency Medicine | Admitting: Emergency Medicine

## 2020-02-10 ENCOUNTER — Emergency Department (HOSPITAL_COMMUNITY)
Admission: EM | Admit: 2020-02-10 | Discharge: 2020-02-10 | Discharge status: Against Medical Advice | Payer: Medicaid Other | Source: Home / Self Care

## 2020-02-10 DIAGNOSIS — Z7722 Contact with and (suspected) exposure to environmental tobacco smoke (acute) (chronic): Secondary | ICD-10-CM | POA: Insufficient documentation

## 2020-02-10 DIAGNOSIS — R05 Cough: Secondary | ICD-10-CM | POA: Diagnosis present

## 2020-02-10 DIAGNOSIS — R059 Cough, unspecified: Secondary | ICD-10-CM

## 2020-02-10 DIAGNOSIS — U071 COVID-19: Secondary | ICD-10-CM | POA: Insufficient documentation

## 2020-02-10 DIAGNOSIS — Z91040 Latex allergy status: Secondary | ICD-10-CM | POA: Insufficient documentation

## 2020-02-10 LAB — CBC WITH DIFFERENTIAL/PLATELET
Abs Immature Granulocytes: 0.02 10*3/uL (ref 0.00–0.07)
Basophils Absolute: 0 10*3/uL (ref 0.0–0.1)
Basophils Relative: 0 %
Eosinophils Absolute: 0.1 10*3/uL (ref 0.0–0.5)
Eosinophils Relative: 1 %
HCT: 34.4 % — ABNORMAL LOW (ref 36.0–46.0)
Hemoglobin: 10.6 g/dL — ABNORMAL LOW (ref 12.0–15.0)
Immature Granulocytes: 0 %
Lymphocytes Relative: 37 %
Lymphs Abs: 4.6 10*3/uL — ABNORMAL HIGH (ref 0.7–4.0)
MCH: 24 pg — ABNORMAL LOW (ref 26.0–34.0)
MCHC: 30.8 g/dL (ref 30.0–36.0)
MCV: 77.8 fL — ABNORMAL LOW (ref 80.0–100.0)
Monocytes Absolute: 0.7 10*3/uL (ref 0.1–1.0)
Monocytes Relative: 5 %
Neutro Abs: 7.1 10*3/uL (ref 1.7–7.7)
Neutrophils Relative %: 57 %
Platelets: 340 10*3/uL (ref 150–400)
RBC: 4.42 MIL/uL (ref 3.87–5.11)
RDW: 17.1 % — ABNORMAL HIGH (ref 11.5–15.5)
WBC: 12.5 10*3/uL — ABNORMAL HIGH (ref 4.0–10.5)
nRBC: 0 % (ref 0.0–0.2)

## 2020-02-10 LAB — COMPREHENSIVE METABOLIC PANEL
ALT: 25 U/L (ref 0–44)
AST: 36 U/L (ref 15–41)
Albumin: 3.8 g/dL (ref 3.5–5.0)
Alkaline Phosphatase: 61 U/L (ref 38–126)
Anion gap: 10 (ref 5–15)
BUN: 10 mg/dL (ref 6–20)
CO2: 25 mmol/L (ref 22–32)
Calcium: 8.9 mg/dL (ref 8.9–10.3)
Chloride: 103 mmol/L (ref 98–111)
Creatinine, Ser: 0.74 mg/dL (ref 0.44–1.00)
GFR calc Af Amer: >60 mL/min (ref >60)
GFR calc non Af Amer: >60 mL/min (ref >60)
Glucose, Bld: 126 mg/dL — ABNORMAL HIGH (ref 70–99)
Potassium: 4.6 mmol/L (ref 3.5–5.1)
Sodium: 138 mmol/L (ref 135–145)
Total Bilirubin: 1.3 mg/dL — ABNORMAL HIGH (ref 0.3–1.2)
Total Protein: 7.7 g/dL (ref 6.5–8.1)

## 2020-02-10 LAB — TROPONIN I (HIGH SENSITIVITY): Troponin I (High Sensitivity): 4 ng/L (ref <18)

## 2020-02-10 MED ORDER — BENZONATATE 100 MG PO CAPS
100.0000 mg | ORAL_CAPSULE | Freq: Three times a day (TID) | ORAL | 0 refills | Status: DC
Start: 2020-02-10 — End: 2020-02-23

## 2020-02-10 MED ORDER — ACETAMINOPHEN 500 MG PO TABS
1000.0000 mg | ORAL_TABLET | Freq: Once | ORAL | Status: AC
  Administered 2020-02-10: 01:00:00 500 mg via ORAL
  Filled 2020-02-10: qty 2

## 2020-02-10 NOTE — ED Notes (Signed)
Pt ambulated in room; pt's oxygen remained 100

## 2020-02-10 NOTE — ED Triage Notes (Signed)
Pt complains of chest pain and a headache for three days, pt states she tested positive for COVID at [facility] three days ago

## 2020-02-10 NOTE — ED Notes (Signed)
Pt attempted to leave her room to walk down the hall. I informed the pt that she was not allowed out of her room, because she has covid and she is exposing other patients. Pt ignored me and continued to walk down the hall. Security escorted the pt back to her room. I provided her with a bedside commode.

## 2020-02-10 NOTE — ED Provider Notes (Signed)
Patient was never found to be bedded in a room.   Darnice Comrie, Barbara Cower, MD 02/10/20 Earle Gell

## 2020-02-10 NOTE — ED Notes (Signed)
Requesting O2 for comfort. Sat is 98% on room air. Pt is aware.

## 2020-02-10 NOTE — ED Provider Notes (Signed)
[facility]-EMERGENCY DEPT Provider Note   CSN: [id number] Arrival date & time: 02/10/20  0025     History No chief complaint on file.   Melinda Jimenez is a 34 y.o. female.  Patient to ED with complaint of cough and chest tightness. Reports testing positive for COVID while in [facility] 3 days ago. No fever. No loss of taste or smell. No SOB, vomiting. She has not tried taking anything for symptoms.   The history is provided by the patient. No language interpreter was used.       Past Medical History:  Diagnosis Date  . Anemia   . Anxiety    off meds with preg  . Carpal tunnel syndrome   . Chlamydia   . Genital HSV    Last OB: > 62yr ago; Valtrex 500 mg po prn outbreaks  . GERD (gastroesophageal reflux disease)   . Gestational diabetes    diet controlled  . Headache(784.0)   . IBS (irritable bowel syndrome)   . Infection    UTI  . Mood swings   . Panic attack   . Thyroid disease   . Trichomonas     Patient Active Problem List   Diagnosis Date Noted  . H/O: cesarean section 09/27/2016  . GERD without esophagitis 01/20/2013  . Anxiety and depression 01/20/2013  . CARPAL TUNNEL SYNDROME 06/02/2010  . Vitamin D deficiency 04/19/2010  . Herpes genitalis in women 04/18/2010  . Goiter, unspecified 04/18/2010    Past Surgical History:  Procedure Laterality Date  . CESAREAN SECTION N/A 05/20/2013   Procedure: CESAREAN SECTION;  Surgeon: Brock Bad, MD;  Location: [facility];  Service: Obstetrics;  Laterality: N/A;  . CESAREAN SECTION N/A 04/06/2017   Procedure: CESAREAN SECTION;  Surgeon: Hermina Staggers, MD;  Location: [facility] BIRTHING SUITES;  Service: Obstetrics;  Laterality: N/A;  . INDUCED ABORTION  2009  . VAGINAL DELIVERY  2007     OB History    Gravida  9   Para  4   Term  3   Preterm  1   AB  5   Living  3     SAB  2   TAB  3   Ectopic      Multiple  0   Live Births  3           Family History    Problem Relation Age of Onset  . Depression Mother   . Anxiety disorder Mother   . Breast cancer Other   . Bipolar disorder Brother   . Diabetes Maternal Grandmother   . Cancer Maternal Grandmother   . Hypertension Maternal Grandmother   . Hypertension Paternal Grandmother   . Autism Other   . Schizophrenia Other     Social History   Tobacco Use  . Smoking status: Passive Smoke Exposure - Never Smoker  . Smokeless tobacco: Never Used  . Tobacco comment: was not a daily smoker, tried it  Substance Use Topics  . Alcohol use: No    Comment: Twice a month  . Drug use: No    Home Medications Prior to Admission medications   Medication Sig Start Date End Date Taking? Authorizing Provider  ALPRAZolam Prudy Feeler) 0.5 MG tablet Take 1 tablet (0.5 mg total) by mouth at bedtime as needed for anxiety. 01/18/20   Arvilla Market, DO  fluticasone (FLONASE) 50 MCG/ACT nasal spray Place 1 spray into both nostrils daily. Use for 2-3 weeks to help with  the nasal congestion and ear pain Patient not taking: Reported on 01/21/2020 01/20/20   Blanchie Dessert, MD  polyethylene glycol (MIRALAX / GLYCOLAX) 17 g packet Take 17 g by mouth daily. 02/06/20   Khatri, Hina, PA-C  predniSONE (DELTASONE) 20 MG tablet Take 2 tablets (40 mg total) by mouth daily. 01/20/20   Blanchie Dessert, MD  sertraline (ZOLOFT) 25 MG tablet Take one tablet daily. If tolerating, increase to two tablets in one week. Patient not taking: Reported on 01/21/2020 01/18/20   Nicolette Bang, DO  zolpidem (AMBIEN) 5 MG tablet Take 1 tablet (5 mg total) by mouth at bedtime as needed for sleep. Patient not taking: Reported on 01/21/2020 01/20/20   Blanchie Dessert, MD    Allergies    Zofran Alvis Lemmings hcl] and Latex  Review of Systems   Review of Systems  Constitutional: Negative for chills and fever.  HENT: Negative.   Respiratory: Positive for cough and chest tightness. Negative for shortness of breath.    Cardiovascular: Negative.   Gastrointestinal: Negative.  Negative for nausea.  Musculoskeletal: Negative.   Skin: Negative.   Neurological: Positive for headaches.    Physical Exam Updated Vital Signs BP (!) 136/96   Pulse 88   Temp 98.7 F (37.1 C) (Oral)   Resp 20   LMP 02/09/2020   SpO2 100%   Physical Exam Vitals and nursing note reviewed.  Constitutional:      Appearance: She is well-developed.  HENT:     Head: Normocephalic.  Cardiovascular:     Rate and Rhythm: Normal rate and regular rhythm.  Pulmonary:     Effort: Pulmonary effort is normal.     Breath sounds: Normal breath sounds.  Abdominal:     General: Bowel sounds are normal.     Palpations: Abdomen is soft.     Tenderness: There is no abdominal tenderness. There is no guarding or rebound.  Musculoskeletal:        General: Normal range of motion.     Cervical back: Normal range of motion and neck supple.  Skin:    General: Skin is warm and dry.     Findings: No rash.  Neurological:     Mental Status: She is alert and oriented to person, place, and time.     ED Results / Procedures / Treatments   Labs (all labs ordered are listed, but only abnormal results are displayed) Labs Reviewed  CBC WITH DIFFERENTIAL/PLATELET - Abnormal; Notable for the following components:      Result Value   WBC 12.5 (*)    Hemoglobin 10.6 (*)    HCT 34.4 (*)    MCV 77.8 (*)    MCH 24.0 (*)    RDW 17.1 (*)    Lymphs Abs 4.6 (*)    All other components within normal limits  COMPREHENSIVE METABOLIC PANEL  TROPONIN I (HIGH SENSITIVITY)    EKG EKG Interpretation  Date/Time:  Wednesday Feb 10 2020 01:02:53 EDT Ventricular Rate:  91 PR Interval:    QRS Duration: 86 QT Interval:  344 QTC Calculation: 424 R Axis:   48 Text Interpretation: Sinus rhythm No significant change since last tracing Confirmed by Pryor Curia [phone]) on 02/10/2020 1:17:30 AM   Radiology DG Chest Portable 1 View  Result Date:  02/10/2020 CLINICAL DATA:  Short of breath, COVID-19 positive 3 days ago EXAM: PORTABLE CHEST 1 VIEW COMPARISON:  02/06/2020 FINDINGS: Single frontal view of the chest demonstrates an unremarkable cardiac silhouette. Minimal left basilar airspace  disease could reflect early pneumonia. No effusion or pneumothorax. No acute bony abnormalities. IMPRESSION: 1. Minimal left basilar airspace disease consistent with early pneumonia. Etiology indeterminate. Electronically Signed   By: Sharlet Salina M.D.   On: 02/10/2020 01:27    Procedures Procedures (including critical care time)  Medications Ordered in ED Medications  acetaminophen (TYLENOL) tablet 1,000 mg (500 mg Oral Given 02/10/20 0053)    ED Course  I have reviewed the triage vital signs and the nursing notes.  Pertinent labs & imaging results that were available during my care of the patient were reviewed by me and considered in my medical decision making (see chart for details).    MDM Rules/Calculators/A&P                      Patient is very well appearing. She has been ambulatory without any distress. VSS. No hypoxia. She is not coughing during interview or exam. No tachycardia or tachypnea. She is felt appropriate for discharge home with instructions on safe treatment and isolation with COVID infection.   The patient did not wait for her instructions and eloped without formal discharge.   Final Clinical Impression(s) / ED Diagnoses Final diagnoses:  None   1. Cough  Rx / DC Orders ED Discharge Orders    None       Danne Harbor 02/14/20 2300    Molpus, Jonny Ruiz, MD 02/17/20 2239

## 2020-02-10 NOTE — ED Notes (Signed)
EMT Fredricka Bonine provided the pt with O2 for comfort as well as a mask. Pt threw mask and O2 canula on the floor and stated "I dont need this." This NT went to the pt and asked her to please keep her mask on at all times and to please stay in the designated area of the waiting area. Pt was compliant and put her mask back on. Pt left the designated area and walked outside. Pt came back inside the lobby This NT approached the pt once again and explained to the pt that she needs to stay in the designated area. Pt sat back down. Stood up and stated she was leaving and walked out of the waiting room.

## 2020-02-10 NOTE — Discharge Instructions (Signed)
Use Tessalon for cough as directed.   If symptoms persist, follow up with your doctor.

## 2020-02-11 ENCOUNTER — Emergency Department (HOSPITAL_COMMUNITY)
Admission: EM | Admit: 2020-02-11 | Discharge: 2020-02-11 | Disposition: A | Discharge status: Home / Self Care | Payer: Medicaid Other | Attending: Emergency Medicine | Admitting: Emergency Medicine

## 2020-02-11 ENCOUNTER — Other Ambulatory Visit: Payer: Self-pay

## 2020-02-11 ENCOUNTER — Encounter (HOSPITAL_COMMUNITY): Payer: Self-pay

## 2020-02-11 DIAGNOSIS — R0602 Shortness of breath: Secondary | ICD-10-CM | POA: Diagnosis present

## 2020-02-11 DIAGNOSIS — Z139 Encounter for screening, unspecified: Secondary | ICD-10-CM | POA: Diagnosis not present

## 2020-02-11 DIAGNOSIS — Z7722 Contact with and (suspected) exposure to environmental tobacco smoke (acute) (chronic): Secondary | ICD-10-CM | POA: Insufficient documentation

## 2020-02-11 DIAGNOSIS — Z79899 Other long term (current) drug therapy: Secondary | ICD-10-CM | POA: Insufficient documentation

## 2020-02-11 NOTE — ED Notes (Signed)
Pt sts she does not want to be seen. She feels fine.

## 2020-02-11 NOTE — ED Triage Notes (Signed)
Pt sts she doesn't want to be here. Was here yesterday for similar sx. Sts she feels fine now.

## 2020-02-11 NOTE — ED Provider Notes (Signed)
[facility]-EMERGENCY DEPT Provider Note: Melinda Dell, MD, FACEP  CSN: [id number] MRN: [id number] ARRIVAL: 02/11/20 at 0127 ROOM: WA16/WA16   CHIEF COMPLAINT  Shortness of Breath   HISTORY OF PRESENT ILLNESS  02/11/20 1:45 AM Melinda Jimenez is a 34 y.o. female with her fifth visit to emergency departments since yesterday.  On some of these occasions she left without being seen.  The reason given to EMS for being here is that she is Covid positive and short of breath.  When I went in to see her she told me she was leaving and had no complaints.  She states she feels fine.   Past Medical History:  Diagnosis Date  . Anemia   . Anxiety    off meds with preg  . Carpal tunnel syndrome   . Chlamydia   . Genital HSV    Last OB: > 37yr ago; Valtrex 500 mg po prn outbreaks  . GERD (gastroesophageal reflux disease)   . Gestational diabetes    diet controlled  . Headache(784.0)   . IBS (irritable bowel syndrome)   . Infection    UTI  . Mood swings   . Panic attack   . Thyroid disease   . Trichomonas     Past Surgical History:  Procedure Laterality Date  . CESAREAN SECTION N/A 05/20/2013   Procedure: CESAREAN SECTION;  Surgeon: Brock Bad, MD;  Location: [facility];  Service: Obstetrics;  Laterality: N/A;  . CESAREAN SECTION N/A 04/06/2017   Procedure: CESAREAN SECTION;  Surgeon: Hermina Staggers, MD;  Location: [facility] BIRTHING SUITES;  Service: Obstetrics;  Laterality: N/A;  . INDUCED ABORTION  2009  . VAGINAL DELIVERY  2007    Family History  Problem Relation Age of Onset  . Depression Mother   . Anxiety disorder Mother   . Breast cancer Other   . Bipolar disorder Brother   . Diabetes Maternal Grandmother   . Cancer Maternal Grandmother   . Hypertension Maternal Grandmother   . Hypertension Paternal Grandmother   . Autism Other   . Schizophrenia Other     Social History   Tobacco Use  . Smoking status: Passive Smoke Exposure - Never Smoker  . Smokeless tobacco: Never Used   . Tobacco comment: was not a daily smoker, tried it  Substance Use Topics  . Alcohol use: No    Comment: Twice a month  . Drug use: No    Prior to Admission medications   Medication Sig Start Date End Date Taking? Authorizing Provider  ALPRAZolam Prudy Feeler) 0.5 MG tablet Take 1 tablet (0.5 mg total) by mouth at bedtime as needed for anxiety. 01/18/20   Arvilla Market, DO  benzonatate (TESSALON) 100 MG capsule Take 1 capsule (100 mg total) by mouth every 8 (eight) hours. 02/10/20   Elpidio Anis, PA-C  polyethylene glycol (MIRALAX / GLYCOLAX) 17 g packet Take 17 g by mouth daily. 02/06/20   Khatri, Hina, PA-C  predniSONE (DELTASONE) 20 MG tablet Take 2 tablets (40 mg total) by mouth daily. 01/20/20   Gwyneth Sprout, MD  fluticasone (FLONASE) 50 MCG/ACT nasal spray Place 1 spray into both nostrils daily. Use for 2-3 weeks to help with the nasal congestion and ear pain Patient not taking: Reported on 01/21/2020 01/20/20 02/11/20  Gwyneth Sprout, MD  sertraline (ZOLOFT) 25 MG tablet Take one tablet daily. If tolerating, increase to two tablets in one week. Patient not taking: Reported on 01/21/2020 01/18/20 02/11/20  Arvilla Market, DO  zolpidem (  AMBIEN) 5 MG tablet Take 1 tablet (5 mg total) by mouth at bedtime as needed for sleep. Patient not taking: Reported on 01/21/2020 01/20/20 02/11/20  Blanchie Dessert, MD    Allergies Zofran Alvis Lemmings hcl] and Latex   REVIEW OF SYSTEMS  Negative except as noted here or in the History of Present Illness.   PHYSICAL EXAMINATION  Initial Vital Signs Blood pressure 124/73, pulse 84, temperature 98.2 F (36.8 C), temperature source Oral, resp. rate 18, height 5\' 8"  (1.727 m), weight 108.9 kg, last menstrual period 02/09/2020, SpO2 98 %.  Examination General: Well-developed, well-nourished female in no acute distress; appearance consistent with age of record HENT: normocephalic; atraumatic Eyes: Normal appearance Neck:  supple Heart: regular rate and rhythm Lungs: clear to auscultation bilaterally; no tachypnea; no wheezing Abdomen: soft; nondistended Extremities: No deformity; full range of motion Neurologic: Awake, alert; motor function intact in all extremities and symmetric; no facial droop Skin: Warm and dry Psychiatric: Normal mood and affect   RESULTS  Summary of this visit's results, reviewed and interpreted by myself:   EKG Interpretation  Date/Time:    Ventricular Rate:    PR Interval:    QRS Duration:   QT Interval:    QTC Calculation:   R Axis:     Text Interpretation:        Laboratory Studies: No results found for this or any previous visit (from the past 24 hour(s)). Imaging Studies: DG Chest Portable 1 View  Result Date: 02/10/2020 CLINICAL DATA:  Short of breath, COVID-19 positive 3 days ago EXAM: PORTABLE CHEST 1 VIEW COMPARISON:  02/06/2020 FINDINGS: Single frontal view of the chest demonstrates an unremarkable cardiac silhouette. Minimal left basilar airspace disease could reflect early pneumonia. No effusion or pneumothorax. No acute bony abnormalities. IMPRESSION: 1. Minimal left basilar airspace disease consistent with early pneumonia. Etiology indeterminate. Electronically Signed   By: Randa Ngo M.D.   On: 02/10/2020 01:27    ED COURSE and MDM  Nursing notes, initial and subsequent vitals signs, including pulse oximetry, reviewed and interpreted by myself.  Vitals:   02/11/20 0132 02/11/20 0139  BP:  124/73  Pulse:  84  Resp:  18  Temp:  98.2 F (36.8 C)  TempSrc:  Oral  SpO2:  98%  Weight: 108.9 kg   Height: 5\' 8"  (1.727 m)    Medications - No data to display    PROCEDURES  Procedures   ED DIAGNOSES     ICD-10-CM   1. Encounter for medical screening examination  Z13.9        Raymund Manrique, Jenny Reichmann, MD 02/11/20 [phone]

## 2020-02-12 ENCOUNTER — Encounter (HOSPITAL_COMMUNITY): Payer: Self-pay | Admitting: Emergency Medicine

## 2020-02-12 ENCOUNTER — Emergency Department (HOSPITAL_COMMUNITY)
Admission: EM | Admit: 2020-02-12 | Discharge: 2020-02-12 | Disposition: A | Discharge status: Home / Self Care | Payer: Medicaid Other | Attending: Emergency Medicine | Admitting: Emergency Medicine

## 2020-02-12 ENCOUNTER — Encounter (HOSPITAL_COMMUNITY): Payer: Self-pay

## 2020-02-12 ENCOUNTER — Other Ambulatory Visit: Payer: Self-pay

## 2020-02-12 ENCOUNTER — Emergency Department (HOSPITAL_COMMUNITY)
Admission: EM | Admit: 2020-02-12 | Discharge: 2020-02-13 | Discharge status: Against Medical Advice | Payer: Medicaid Other | Source: Home / Self Care

## 2020-02-12 DIAGNOSIS — R220 Localized swelling, mass and lump, head: Secondary | ICD-10-CM | POA: Insufficient documentation

## 2020-02-12 DIAGNOSIS — R519 Headache, unspecified: Secondary | ICD-10-CM | POA: Insufficient documentation

## 2020-02-12 DIAGNOSIS — Z5321 Procedure and treatment not carried out due to patient leaving prior to being seen by health care provider: Secondary | ICD-10-CM | POA: Insufficient documentation

## 2020-02-12 LAB — COMPREHENSIVE METABOLIC PANEL
ALT: 18 U/L (ref 0–44)
AST: 16 U/L (ref 15–41)
Albumin: 3.7 g/dL (ref 3.5–5.0)
Alkaline Phosphatase: 56 U/L (ref 38–126)
Anion gap: 13 (ref 5–15)
BUN: 11 mg/dL (ref 6–20)
CO2: 23 mmol/L (ref 22–32)
Calcium: 9.1 mg/dL (ref 8.9–10.3)
Chloride: 102 mmol/L (ref 98–111)
Creatinine, Ser: 0.76 mg/dL (ref 0.44–1.00)
GFR calc Af Amer: >60 mL/min (ref >60)
GFR calc non Af Amer: >60 mL/min (ref >60)
Glucose, Bld: 115 mg/dL — ABNORMAL HIGH (ref 70–99)
Potassium: 3.6 mmol/L (ref 3.5–5.1)
Sodium: 138 mmol/L (ref 135–145)
Total Bilirubin: 0.4 mg/dL (ref 0.3–1.2)
Total Protein: 7.6 g/dL (ref 6.5–8.1)

## 2020-02-12 LAB — RAPID URINE DRUG SCREEN, HOSP PERFORMED
Amphetamines: NOT DETECTED
Barbiturates: NOT DETECTED
Benzodiazepines: NOT DETECTED
Cocaine: NOT DETECTED
Opiates: NOT DETECTED
Tetrahydrocannabinol: NOT DETECTED

## 2020-02-12 LAB — CBC
HCT: 38.4 % (ref 36.0–46.0)
Hemoglobin: 11.5 g/dL — ABNORMAL LOW (ref 12.0–15.0)
MCH: 23.6 pg — ABNORMAL LOW (ref 26.0–34.0)
MCHC: 29.9 g/dL — ABNORMAL LOW (ref 30.0–36.0)
MCV: 78.9 fL — ABNORMAL LOW (ref 80.0–100.0)
Platelets: 373 10*3/uL (ref 150–400)
RBC: 4.87 MIL/uL (ref 3.87–5.11)
RDW: 17.1 % — ABNORMAL HIGH (ref 11.5–15.5)
WBC: 13.2 10*3/uL — ABNORMAL HIGH (ref 4.0–10.5)
nRBC: 0 % (ref 0.0–0.2)

## 2020-02-12 LAB — ACETAMINOPHEN LEVEL: Acetaminophen (Tylenol), Serum: <10 ug/mL — ABNORMAL LOW (ref 10–30)

## 2020-02-12 LAB — SALICYLATE LEVEL: Salicylate Lvl: <7 mg/dL — ABNORMAL LOW (ref 7.0–30.0)

## 2020-02-12 LAB — I-STAT BETA HCG BLOOD, ED (MC, WL, AP ONLY): I-stat hCG, quantitative: <5 m[IU]/mL (ref <5)

## 2020-02-12 LAB — ETHANOL: Alcohol, Ethyl (B): <10 mg/dL (ref <10)

## 2020-02-12 NOTE — ED Triage Notes (Signed)
Pt from home c/o headache and head swelling brought to ED by [facility]

## 2020-02-12 NOTE — ED Triage Notes (Addendum)
Pt arrives to ED for psych eval, states she is having auditory hallucinations and is out of her medications. Pt denies SI/HI at this time.

## 2020-02-12 NOTE — ED Triage Notes (Signed)
Pt c/o head swelling on inside also some mild confusion. Remains verbally appropriate to questions and ambulated with steady gait

## 2020-02-13 ENCOUNTER — Ambulatory Visit (HOSPITAL_COMMUNITY)
Admission: AD | Admit: 2020-02-13 | Discharge: 2020-02-13 | Disposition: A | Discharge status: Home / Self Care | Payer: Medicaid Other | Attending: Psychiatry | Admitting: Psychiatry

## 2020-02-13 NOTE — ED Notes (Signed)
Pt has left ED. Pt was in the lobby bathroom, frequently checked on by this NT. Rather than returning to the triage rooms (the location of her recliner as she awaited Psych eval), pt walked towards the front of the ED lobby and towards the doors. This NT called out to the pt by name, and pt ignored this NT, continued walking through the exit doors, and climbed into a car that was idle outside the lobby. The car then drove away.

## 2020-02-15 ENCOUNTER — Telehealth: Payer: Medicaid Other | Admitting: Internal Medicine

## 2020-02-16 ENCOUNTER — Emergency Department (HOSPITAL_COMMUNITY)
Admission: EM | Admit: 2020-02-16 | Discharge: 2020-02-16 | Disposition: A | Discharge status: Home / Self Care | Payer: Medicaid Other | Attending: Emergency Medicine | Admitting: Emergency Medicine

## 2020-02-16 ENCOUNTER — Encounter (HOSPITAL_COMMUNITY): Payer: Self-pay | Admitting: Emergency Medicine

## 2020-02-16 ENCOUNTER — Other Ambulatory Visit: Payer: Self-pay

## 2020-02-16 DIAGNOSIS — Z91040 Latex allergy status: Secondary | ICD-10-CM | POA: Insufficient documentation

## 2020-02-16 DIAGNOSIS — Z79899 Other long term (current) drug therapy: Secondary | ICD-10-CM | POA: Diagnosis not present

## 2020-02-16 DIAGNOSIS — Z7722 Contact with and (suspected) exposure to environmental tobacco smoke (acute) (chronic): Secondary | ICD-10-CM | POA: Diagnosis not present

## 2020-02-16 DIAGNOSIS — R519 Headache, unspecified: Secondary | ICD-10-CM | POA: Diagnosis present

## 2020-02-16 MED ORDER — IBUPROFEN 400 MG PO TABS
600.0000 mg | ORAL_TABLET | Freq: Once | ORAL | Status: AC
  Administered 2020-02-16: 600 mg via ORAL
  Filled 2020-02-16: qty 1

## 2020-02-16 MED ORDER — SUMATRIPTAN SUCCINATE 50 MG PO TABS
50.0000 mg | ORAL_TABLET | ORAL | 0 refills | Status: DC | PRN
Start: 2020-02-16 — End: 2020-03-21

## 2020-02-16 MED ORDER — ACETAMINOPHEN 325 MG PO TABS
650.0000 mg | ORAL_TABLET | Freq: Once | ORAL | Status: AC
  Administered 2020-02-16: 650 mg via ORAL
  Filled 2020-02-16: qty 2

## 2020-02-16 MED ORDER — HYDROXYZINE HCL 25 MG PO TABS
50.0000 mg | ORAL_TABLET | Freq: Once | ORAL | Status: AC
  Administered 2020-02-16: 50 mg via ORAL
  Filled 2020-02-16: qty 2

## 2020-02-16 NOTE — ED Notes (Signed)
PA saw pt. Immediately following pt decided she wanted to leave stating she does not want to wait for discharge papers.  Pt ambulatory with steady gait to lobby with friend.

## 2020-02-16 NOTE — Discharge Instructions (Signed)
Please follow-up with your primary care doctor.  I recommend also seeing neurology as they may be helpful in further headache evaluation. I recommend Tylenol and ibuprofen.  If these do not work in the doses below then you may use sumatriptan which I have prescribed you.  Also recommend drinking plenty of water and eating 3 meals a day.  If you drink caffeine regularly may taper down slowly but did not quit drinking immediately.  Do not drink any alcohol.

## 2020-02-16 NOTE — ED Provider Notes (Signed)
[facility] EMERGENCY DEPARTMENT Provider Note   CSN: [id number] Arrival date & time: 02/16/20  1207     History Chief Complaint  Patient presents with  . Migraine    JOANY KHATIB is a 34 y.o. female.  HPI  Patient 34 year old female with past medical history of frequent headaches, carpal tunnel syndrome, reflux, anxiety, depression, IBS  Patient is presented today with "whole head "headache which has been intermittent for the past 2 weeks.  She states that she has had no relief from Tylenol or ibuprofen--she states that she takes 1 or 2 tablets at a time of Tylenol or ibuprofen She is uncertain which.  She states that she has not eaten or drank anything today but denies any nausea.  She states she has not taken any Tylenol or ibuprofen today.  She states she also feels jittery.  Patient denies any nausea, vomiting, fevers, chills, neck stiffness, numbness between her legs, numbness in any extremity, lightheadedness or dizziness.  She denies any photophobia or visual changes.  Denies any weakness fatigue.     Past Medical History:  Diagnosis Date  . Anemia   . Anxiety    off meds with preg  . Carpal tunnel syndrome   . Chlamydia   . Genital HSV    Last OB: > 39yr ago; Valtrex 500 mg po prn outbreaks  . GERD (gastroesophageal reflux disease)   . Gestational diabetes    diet controlled  . Headache(784.0)   . IBS (irritable bowel syndrome)   . Infection    UTI  . Mood swings   . Panic attack   . Thyroid disease   . Trichomonas     Patient Active Problem List   Diagnosis Date Noted  . H/O: cesarean section 09/27/2016  . GERD without esophagitis 01/20/2013  . Anxiety and depression 01/20/2013  . CARPAL TUNNEL SYNDROME 06/02/2010  . Vitamin D deficiency 04/19/2010  . Herpes genitalis in women 04/18/2010  . Goiter, unspecified 04/18/2010    Past Surgical History:  Procedure Laterality Date  . CESAREAN SECTION N/A 05/20/2013   Procedure: CESAREAN  SECTION;  Surgeon: Brock Bad, MD;  Location: [facility];  Service: Obstetrics;  Laterality: N/A;  . CESAREAN SECTION N/A 04/06/2017   Procedure: CESAREAN SECTION;  Surgeon: Hermina Staggers, MD;  Location: [facility] BIRTHING SUITES;  Service: Obstetrics;  Laterality: N/A;  . INDUCED ABORTION  2009  . VAGINAL DELIVERY  2007     OB History    Gravida  9   Para  4   Term  3   Preterm  1   AB  5   Living  3     SAB  2   TAB  3   Ectopic      Multiple  0   Live Births  3           Family History  Problem Relation Age of Onset  . Depression Mother   . Anxiety disorder Mother   . Breast cancer Other   . Bipolar disorder Brother   . Diabetes Maternal Grandmother   . Cancer Maternal Grandmother   . Hypertension Maternal Grandmother   . Hypertension Paternal Grandmother   . Autism Other   . Schizophrenia Other     Social History   Tobacco Use  . Smoking status: Passive Smoke Exposure - Never Smoker  . Smokeless tobacco: Never Used  . Tobacco comment: was not a daily smoker, tried it  Substance Use  Topics  . Alcohol use: No    Comment: Twice a month  . Drug use: No    Home Medications Prior to Admission medications   Medication Sig Start Date End Date Taking? Authorizing Provider  ALPRAZolam Prudy Feeler) 0.5 MG tablet Take 1 tablet (0.5 mg total) by mouth at bedtime as needed for anxiety. 01/18/20  Yes Arvilla Market, DO  Ascorbic Acid (VITAMIN C) 1000 MG tablet Take 1,000 mg by mouth daily.   Yes [provider]  valACYclovir (VALTREX) 1000 MG tablet Take 1,000 mg by mouth daily.  12/29/19  Yes [provider]  ZINC OXIDE PO Take 1 tablet by mouth daily.   Yes [provider]  benzonatate (TESSALON) 100 MG capsule Take 1 capsule (100 mg total) by mouth every 8 (eight) hours. Patient not taking: Reported on 02/16/2020 02/10/20   Elpidio Anis, PA-C  polyethylene glycol (MIRALAX / GLYCOLAX) 17 g packet Take 17 g by mouth  daily. Patient not taking: Reported on 02/16/2020 02/06/20   Dietrich Pates, PA-C  predniSONE (DELTASONE) 20 MG tablet Take 2 tablets (40 mg total) by mouth daily. Patient not taking: Reported on 02/16/2020 01/20/20   Gwyneth Sprout, MD  SUMAtriptan (IMITREX) 50 MG tablet Take 1 tablet (50 mg total) by mouth every 2 (two) hours as needed for migraine. May repeat in 2 hours if headache persists or recurs. 02/16/20   Aarilyn Dye, Rodrigo Ran, PA  fluticasone (FLONASE) 50 MCG/ACT nasal spray Place 1 spray into both nostrils daily. Use for 2-3 weeks to help with the nasal congestion and ear pain Patient not taking: Reported on 01/21/2020 01/20/20 02/11/20  Gwyneth Sprout, MD  sertraline (ZOLOFT) 25 MG tablet Take one tablet daily. If tolerating, increase to two tablets in one week. Patient not taking: Reported on 01/21/2020 01/18/20 02/11/20  Arvilla Market, DO  zolpidem (AMBIEN) 5 MG tablet Take 1 tablet (5 mg total) by mouth at bedtime as needed for sleep. Patient not taking: Reported on 01/21/2020 01/20/20 02/11/20  Gwyneth Sprout, MD    Allergies    Zofran Frazier Richards hcl] and Latex  Review of Systems   Review of Systems  Constitutional: Negative for fever.  HENT: Negative for congestion.   Respiratory: Negative for shortness of breath.   Cardiovascular: Negative for chest pain.  Gastrointestinal: Negative for abdominal distention.  Neurological: Positive for headaches. Negative for dizziness.    Physical Exam Updated Vital Signs BP 127/70   Pulse 97   Temp 98.8 F (37.1 C) (Oral)   Resp 16   Ht 5\' 8"  (1.727 m)   Wt 108.9 kg   LMP 02/09/2020   SpO2 99%   BMI 36.49 kg/m   Physical Exam Vitals and nursing note reviewed.  Constitutional:      Appearance: She is not ill-appearing, toxic-appearing or diaphoretic.     Comments: Patient appears uncomfortable.  She is sitting in bed with husband at bedside.  She is pleasant, able answer questions appropriately and follow commands   HENT:     Head: Normocephalic and atraumatic.     Nose: Nose normal.     Mouth/Throat:     Mouth: Mucous membranes are dry.  Eyes:     General: No scleral icterus. Cardiovascular:     Rate and Rhythm: Normal rate and regular rhythm.     Pulses: Normal pulses.     Heart sounds: Normal heart sounds.  Pulmonary:     Effort: Pulmonary effort is normal. No respiratory distress.  Breath sounds: No wheezing.  Abdominal:     Palpations: Abdomen is soft.     Tenderness: There is no abdominal tenderness.  Musculoskeletal:     Cervical back: Normal range of motion.     Right lower leg: No edema.     Left lower leg: No edema.  Skin:    General: Skin is warm and dry.     Capillary Refill: Capillary refill takes less than 2 seconds.  Neurological:     Mental Status: She is alert. Mental status is at baseline.     Comments: Alert and oriented to self, place, time and event.   Speech is fluent, clear without dysarthria or dysphasia.   Strength 5/5 in upper/lower extremities  Sensation intact in upper/lower extremities   Normal gait.  Negative Romberg. No pronator drift.  Normal finger-to-nose and feet tapping.  CN I not tested  CN II grossly intact visual fields bilaterally. Did not visualize posterior eye.   CN III, IV, VI PERRLA and EOMs intact bilaterally  CN V Intact sensation to sharp and light touch to the face  CN VII facial movements symmetric  CN VIII not tested  CN IX, X no uvula deviation, symmetric rise of soft palate  CN XI 5/5 SCM and trapezius strength bilaterally  CN XII Midline tongue protrusion, symmetric L/R movements   Psychiatric:        Mood and Affect: Mood normal.        Behavior: Behavior normal.     ED Results / Procedures / Treatments   Labs (all labs ordered are listed, but only abnormal results are displayed) Labs Reviewed - No data to display  EKG None  Radiology No results found.  Procedures Procedures (including critical care  time)  Medications Ordered in ED Medications  ibuprofen (ADVIL) tablet 600 mg (600 mg Oral Given 02/16/20 1708)  acetaminophen (TYLENOL) tablet 650 mg (650 mg Oral Given 02/16/20 1708)  hydrOXYzine (ATARAX/VISTARIL) tablet 50 mg (50 mg Oral Given 02/16/20 1708)    ED Course  I have reviewed the triage vital signs and the nursing notes.  Pertinent labs & imaging results that were available during my care of the patient were reviewed by me and considered in my medical decision making (see chart for details).    MDM Rules/Calculators/A&P                      Patient is a 34 year old female with history of frequent headaches present today with headache.  She denies any other significant symptoms.  She has not taken anything prior to arrival.  She is refusing ibuprofen and Tylenol initially however did agree to take this and eat some food and drink some water.  Because of significant delay and treatment time patient decided she would like to leave after Tylenol ibuprofen.  She was fluid challenged and had no difficulty tolerating p.o.  She has no neurologic abnormalities and is well-appearing on exam.  No indication for further work-up at this time.  I gave her follow-up information for neurology as a believe she would benefit from consultation with them for her headaches.  I also prescribed her sumatriptan.  Final Clinical Impression(s) / ED Diagnoses Final diagnoses:  Bad headache    Rx / DC Orders ED Discharge Orders         Ordered    SUMAtriptan (IMITREX) 50 MG tablet  Every 2 hours PRN     02/16/20 1704  Gailen Shelter, [place] 02/16/20 2140    Terrilee Files, MD 02/17/20 [phone]

## 2020-02-16 NOTE — ED Triage Notes (Signed)
Pt endorses migraine for 2 weeks. No relief with tylenol or ibuprofen.

## 2020-02-17 ENCOUNTER — Emergency Department (HOSPITAL_COMMUNITY)
Admission: EM | Admit: 2020-02-17 | Discharge: 2020-02-17 | Discharge status: Against Medical Advice | Payer: Medicaid Other

## 2020-02-17 ENCOUNTER — Ambulatory Visit: Payer: Medicaid Other | Admitting: Physician Assistant

## 2020-02-17 ENCOUNTER — Other Ambulatory Visit: Payer: Self-pay

## 2020-02-17 VITALS — BP 120/81 | HR 64 | Resp 16 | Ht 68.0 in

## 2020-02-17 DIAGNOSIS — F314 Bipolar disorder, current episode depressed, severe, without psychotic features: Secondary | ICD-10-CM | POA: Diagnosis not present

## 2020-02-17 DIAGNOSIS — R7309 Other abnormal glucose: Secondary | ICD-10-CM | POA: Diagnosis not present

## 2020-02-17 DIAGNOSIS — R7303 Prediabetes: Secondary | ICD-10-CM

## 2020-02-17 DIAGNOSIS — F411 Generalized anxiety disorder: Secondary | ICD-10-CM

## 2020-02-17 DIAGNOSIS — E559 Vitamin D deficiency, unspecified: Secondary | ICD-10-CM | POA: Diagnosis not present

## 2020-02-17 LAB — POCT GLYCOSYLATED HEMOGLOBIN (HGB A1C): Hemoglobin A1C: 6.2 % — AB (ref 4.0–5.6)

## 2020-02-17 MED ORDER — QUETIAPINE FUMARATE 50 MG PO TABS: 50.0000 mg | ORAL_TABLET | Freq: Every day | ORAL | 2 refills | Status: DC

## 2020-02-17 MED ORDER — HYDROXYZINE HCL 10 MG PO TABS: 10.0000 mg | ORAL_TABLET | Freq: Three times a day (TID) | ORAL | 0 refills | Status: DC | PRN
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
## 2020-02-17 NOTE — Patient Instructions (Signed)
I would like you to start taking Seroquel 50 mg at bedtime, I do think that this is going to offer you relief from your depression and anxiety as well as help you sleep 8 hours.  I also prescribed hydroxyzine 10 mg, you can use this 3 times a day as needed, you can use this to help you with anxiety.  We are setting up an appointment with you to get started with a counselor to help you work through everything that is going on.  I am going to work on seeing what I can do to get you approved for further imaging for your severe headaches.  We are checking your thyroid function as well as vitamin D levels today.  We will call you with the results of your labs.  Thank you for trusting [place] with your care, I hope that you feel better soon  Roney Jaffe, PA-C Physician Assistant [facility] [email or web address]  Quetiapine tablets What is this medicine? QUETIAPINE (kwe TYE a peen) is an antipsychotic. It is used to treat schizophrenia and bipolar disorder, also known as manic-depression. This medicine may be used for other purposes; ask your health care provider or pharmacist if you have questions. COMMON BRAND NAME(S): Seroquel What should I tell my health care provider before I take this medicine? They need to know if you have any of these conditions:  blockage in your bowel  cataracts  constipation  dehydration  diabetes  difficulty swallowing  glaucoma  heart disease  history of breast cancer  kidney disease  liver disease  low blood counts, like low white cell, platelet, or red cell counts  low blood pressure or dizziness when standing up  Parkinson's disease  previous heart attack  prostate disease  seizures  stomach or intestine problems  suicidal thoughts, plans or attempt; a previous suicide attempt by you or a family member  thyroid disease  trouble passing urine  an unusual or allergic reaction  to quetiapine, other medicines, foods, dyes, or preservatives  pregnant or trying to get pregnant  breast-feeding How should I use this medicine? Take this medicine by mouth. Swallow it with a drink of water. Follow the directions on the prescription label. If it upsets your stomach you can take it with food. Take your medicine at regular intervals. Do not take it more often than directed. Do not stop taking except on the advice of your doctor or health care professional. A special MedGuide will be given to you by the pharmacist with each prescription and refill. Be sure to read this information carefully each time. Talk to your pediatrician regarding the use of this medicine in children. While this drug may be prescribed for children as young as 10 years for selected conditions, precautions do apply. Patients over age 53 years may have a stronger reaction to this medicine and need smaller doses. Overdosage: If you think you have taken too much of this medicine contact a poison control center or emergency room at once. NOTE: This medicine is only for you. Do not share this medicine with others. What if I miss a dose? If you miss a dose, take it as soon as you can. If it is almost time for your next dose, take only that dose. Do not take double or extra doses. What may interact with this medicine? Do not take this medicine with any of the following medications:  cisapride  dronedarone  fluconazole  metoclopramide  pimozide  posaconazole  thioridazine This  medicine may also interact with the following medications:  alcohol  antihistamines for allergy cough and cold  antiviral medicines for HIV or AIDS  atropine  certain medicines for bladder problems like oxybutynin, tolterodine  certain medicines for blood pressure  certain medicines for depression, anxiety, or psychotic disturbances  certain medicines for diabetes  certain medicines for stomach problems like  dicyclomine, hyoscyamine  certain medicines for travel sickness like scopolamine  certain medicines for Parkinson's disease  certain medicines for seizures like carbamazepine, phenobarbital, phenytoin  cimetidine  erythromycin  ipratropium  other medicines that prolong the QT interval (cause an abnormal heart rhythm) like dofetilide  rifampin  steroid medicines like prednisone or cortisone This list may not describe all possible interactions. Give your health care provider a list of all the medicines, herbs, non-prescription drugs, or dietary supplements you use. Also tell them if you smoke, drink alcohol, or use illegal drugs. Some items may interact with your medicine. What should I watch for while using this medicine? Visit your health care professional for regular checks on your progress. Tell your health care professional if symptoms do not start to get better or if they get worse. Do not stop taking except on your health care professional's advice. You may develop a severe reaction. Your health care professional will tell you how much medicine to take. You may need to have an eye exam before and during use of this medicine. This medicine may increase blood sugar. Ask your health care provider if changes in diet or medicines are needed if you have diabetes. Patients and their families should watch out for new or worsening depression or thoughts of suicide. Also watch out for sudden or severe changes in feelings such as feeling anxious, agitated, panicky, irritable, hostile, aggressive, impulsive, severely restless, overly excited and hyperactive, or not being able to sleep. If this happens, especially at the beginning of antidepressant treatment or after a change in dose, call your health care professional. Bonita Quin may get dizzy or drowsy. Do not drive, use machinery, or do anything that needs mental alertness until you know how this medicine affects you. Do not stand or sit up quickly,  especially if you are an older patient. This reduces the risk of dizzy or fainting spells. Alcohol may interfere with the effect of this medicine. Avoid alcoholic drinks. This drug can cause problems with controlling your body temperature. It can lower the response of your body to cold temperatures. If possible, stay indoors during cold weather. If you must go outdoors, wear warm clothes. It can also lower the response of your body to heat. Do not overheat. Do not over-exercise. Stay out of the sun when possible. If you must be in the sun, wear cool clothing. Drink plenty of water. If you have trouble controlling your body temperature, call your health care provider right away. What side effects may I notice from receiving this medicine? Side effects that you should report to your doctor or health care professional as soon as possible:  allergic reactions like skin rash, itching or hives, swelling of the face, lips, or tongue  breathing problems  changes in vision  confusion  elevated mood, decreased need for sleep, racing thoughts, impulsive behavior  eye pain  fast, irregular heartbeat  fever or chills, sore throat  inability to keep still  males: prolonged or painful erection  problems with balance, talking, walking  redness, blistering, peeling, or loosening of the skin, including inside the mouth  seizures  signs and  symptoms of high blood sugar such as being more thirsty or hungry or having to urinate more than normal. You may also feel very tired or have blurry vision  signs and symptoms of hypothyroidism like fatigue; increased sensitivity to cold; weight gain; hoarseness; thinning hair  signs and symptoms of low blood pressure like dizziness; feeling faint or lightheaded; falls; unusually weak or tired  signs and symptoms of neuroleptic malignant syndrome like confusion; fast, irregular heartbeat; high fever; increased sweating; stiff muscles  sudden numbness or  weakness of the face, arm, or leg  suicidal thoughts or other mood changes  trouble swallowing  uncontrollable movements of the arms, face, head, mouth, neck, or upper body Side effects that usually do not require medical attention (report to your doctor or health care professional if they continue or are bothersome):  change in sex drive or performance  constipation  drowsiness  dry mouth  upset stomach  weight gain This list may not describe all possible side effects. Call your doctor for medical advice about side effects. You may report side effects to FDA at 1-800-FDA-1088. Where should I keep my medicine? Keep out of the reach of children. Store at room temperature between 15 and 30 degrees C (59 and 86 degrees F). Throw away any unused medicine after the expiration date. NOTE: This sheet is a summary. It may not cover all possible information. If you have questions about this medicine, talk to your doctor, pharmacist, or health care provider.  2020 Elsevier/Gold Standard (2019-07-15 11:59:11)

## 2020-02-17 NOTE — Progress Notes (Signed)
poctPt states she doesn't have any of her medications. Pt states her family had her purse and her medications went missing afterwards

## 2020-02-23 ENCOUNTER — Other Ambulatory Visit: Payer: Self-pay

## 2020-02-23 ENCOUNTER — Encounter: Payer: Self-pay | Admitting: Neurology

## 2020-02-23 ENCOUNTER — Telehealth: Payer: Self-pay | Admitting: Neurology

## 2020-02-23 ENCOUNTER — Ambulatory Visit: Payer: Medicaid Other | Admitting: Neurology

## 2020-02-23 VITALS — BP 127/89 | HR 89 | Ht 68.0 in | Wt 226.0 lb

## 2020-02-23 DIAGNOSIS — R519 Headache, unspecified: Secondary | ICD-10-CM

## 2020-02-23 DIAGNOSIS — R0683 Snoring: Secondary | ICD-10-CM | POA: Diagnosis not present

## 2020-02-23 DIAGNOSIS — R351 Nocturia: Secondary | ICD-10-CM

## 2020-02-23 DIAGNOSIS — G4489 Other headache syndrome: Secondary | ICD-10-CM | POA: Diagnosis not present

## 2020-02-23 DIAGNOSIS — G4719 Other hypersomnia: Secondary | ICD-10-CM | POA: Diagnosis not present

## 2020-02-23 MED ORDER — CYCLOBENZAPRINE HCL 5 MG PO TABS: 10.0000 mg | ORAL_TABLET | Freq: Every evening | ORAL | 0 refills | Status: DC | PRN

## 2020-02-23 NOTE — Progress Notes (Signed)
Subjective:    Patient ID: CHE BELOW is a 34 y.o. female.  HPI     Huston Foley, MD, PhD [facility] [address], [address] [address] [place], [place] [address]  I saw patient, Melinda Jimenez, As a referral from the emergency room for recurrent headaches, concern for migraines.  The patient is accompanied by her fiance today.  She is a 34 year old right-handed woman with an underlying medical history of reflux disease, irritable bowel syndrome, thyroid disease, gestational diabetes, anxiety, anemia, and obesity, who presented to the emergency room on 02/16/2020 with a 2-week history of recurrent headaches.  She was treated symptomatically in the ER.  I reviewed the emergency room records.  She was given a prescription for sumatriptan. She had a head CT without contrast on 01/01/2020 and I reviewed the results: IMPRESSION: No acute intracranial pathology.   She reports that she recently saw her primary care physician for these headaches, no specific treatment was recommended.  She has tried Tylenol and ibuprofen with no significant improvement, reports a daily headache, started about 2 weeks ago.  She has not had any prior recurrent migrainous headaches.  Headache is associated at times with photophobia and nausea, typically no vomiting.  It is at times left-sided and sometimes located in the neck area.  She feels stiffer in her neck.  She was treated for an ear infection on the left side but reports that she did not finish the antibiotic.  She reports sensitivity to medications and is currently not taking any medications.  She did not pick up the prescription for Imitrex.  She has multiple prescription medications on her list but is not taking any of these.  She does snore, she has woken up with a headache and has nocturia about 2 times per average night.  Her father has sleep apnea and uses a CPAP machine.  She has prescription eyeglasses which she is supposed to wear every day  but is currently not wearing them and has not worn them for the past few days.  She reports that her last eye examination was about 6 months ago with an optometrist.  She reports that her eyeglasses were up-to-date at the time.  She drinks caffeine in the form of soda, 1/day on average and tries to hydrate well.  Of note, she has had recent numerous emergency room visits for various problems. She had 12 ER visits in the month of May.     Her Past Medical History Is Significant For: Past Medical History:  Diagnosis Date  . Anemia   . Anxiety    off meds with preg  . Carpal tunnel syndrome   . Chlamydia   . 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 GERD (gastroesophageal reflux disease)   . Gestational diabetes    diet controlled  . Headache(784.0)   . IBS (irritable bowel syndrome)   . Infection    UTI  . Mood swings   . Panic attack   . Thyroid disease   . Trichomonas     Her Past Surgical History Is Significant For: Past Surgical History:  Procedure Laterality Date  . CESAREAN SECTION N/A 05/20/2013   Procedure: CESAREAN SECTION;  Surgeon: Brock Bad, MD;  Location: [facility];  Service: Obstetrics;  Laterality: N/A;  . CESAREAN SECTION N/A 04/06/2017   Procedure: CESAREAN SECTION;  Surgeon: Hermina Staggers, MD;  Location: [facility];  Service: Obstetrics;  Laterality: N/A;  . INDUCED ABORTION  2009  . VAGINAL DELIVERY  2007    Her Family History Is Significant For: Family History  Problem Relation Age of Onset  . Depression Mother   . Anxiety disorder Mother   . Breast cancer Other   . Bipolar disorder Brother   . Diabetes Maternal Grandmother   . Cancer Maternal Grandmother   . Hypertension Maternal Grandmother   . Hypertension Paternal Grandmother   . Autism Other   . Schizophrenia Other     Her Social History Is Significant For: Social History   Socioeconomic History  . Marital status: Single    Spouse name: Not on  file  . Number of children: 1  . Years of education: Not on file  . Highest education level: Not on file  Occupational History  . Occupation: Unemployed  Tobacco Use  . Smoking status: Passive Smoke Exposure - Never Smoker  . Smokeless tobacco: Never Used  . Tobacco comment: was not a daily smoker, tried it  Substance and Sexual Activity  . Alcohol use: No    Comment: Twice a month  . Drug use: No  . Sexual activity: Yes    Birth control/protection: None    Comment: Patient is interested in implant  Other Topics Concern  . Not on file  Social History Narrative  . Not on file   Social Determinants of Health   Financial Resource Strain:   . Difficulty of Paying Living Expenses:   Food Insecurity:   . Worried About Programme researcher, broadcasting/film/video in the Last Year:   . Barista in the Last Year:   Transportation Needs:   . Freight forwarder (Medical):   Marland Kitchen Lack of Transportation (Non-Medical):   Physical Activity:   . Days of Exercise per Week:   . Minutes of Exercise per Session:   Stress:   . Feeling of Stress :   Social Connections:   . Frequency of Communication with Friends and Family:   . Frequency of Social Gatherings with Friends and Family:   . Attends Religious Services:   . Active Member of Clubs or Organizations:   . Attends Banker Meetings:   Marland Kitchen Marital Status:     Her Allergies Are:  Allergies  Allergen Reactions  . Zofran [Ondansetron Hcl] Nausea And Vomiting  . Latex Itching  :   Her Current Medications Are:  Outpatient Encounter Medications as of 02/23/2020  Medication Sig  . [DISCONTINUED] ZINC OXIDE PO Take 1 tablet by mouth daily.  . SUMAtriptan (IMITREX) 50 MG tablet Take 1 tablet (50 mg total) by mouth every 2 (two) hours as needed for migraine. May repeat in 2 hours if headache persists or recurs. (Patient not taking: Reported on 02/17/2020)  . valACYclovir (VALTREX) 1000 MG tablet Take 1,000 mg by mouth daily.   . [DISCONTINUED]  ALPRAZolam (XANAX) 0.5 MG tablet Take 1 tablet (0.5 mg total) by mouth at bedtime as needed for anxiety. (Patient not taking: Reported on 02/17/2020)  . [DISCONTINUED] Ascorbic Acid (VITAMIN C) 1000 MG tablet Take 1,000 mg by mouth daily.  . [DISCONTINUED] benzonatate (TESSALON) 100 MG capsule Take 1 capsule (100 mg total) by mouth every 8 (eight) hours. (Patient not taking: Reported on 02/16/2020)  . [DISCONTINUED] fluticasone (FLONASE) 50 MCG/ACT nasal spray Place 1 spray into both nostrils daily. Use for 2-3 weeks to help with the nasal congestion and ear pain (Patient not taking: Reported on 01/21/2020)  . [  DISCONTINUED] hydrOXYzine (ATARAX/VISTARIL) 10 MG tablet Take 1 tablet (10 mg total) by mouth 3 (three) times daily as needed.  . [DISCONTINUED] polyethylene glycol (MIRALAX / GLYCOLAX) 17 g packet Take 17 g by mouth daily. (Patient not taking: Reported on 02/16/2020)  . [DISCONTINUED] predniSONE (DELTASONE) 20 MG tablet Take 2 tablets (40 mg total) by mouth daily. (Patient not taking: Reported on 02/16/2020)  . [DISCONTINUED] QUEtiapine (SEROQUEL) 50 MG tablet Take 1 tablet (50 mg total) by mouth at bedtime.  . [DISCONTINUED] sertraline (ZOLOFT) 25 MG tablet Take one tablet daily. If tolerating, increase to two tablets in one week. (Patient not taking: Reported on 01/21/2020)  . [DISCONTINUED] zolpidem (AMBIEN) 5 MG tablet Take 1 tablet (5 mg total) by mouth at bedtime as needed for sleep. (Patient not taking: Reported on 01/21/2020)   No facility-administered encounter medications on file as of 02/23/2020.   Review of Systems:  Out of a complete 14 point review of systems, all are reviewed and negative with the exception of these symptoms as listed below:  Review of Systems  Neurological:       Here to discuss worsening h/a. Reports daily h/a located on the left temple. Reports otc meds have not helped.   Epworth Sleepiness Scale 0= would never doze 1= slight chance of dozing 2= moderate  chance of dozing 3= high chance of dozing  Sitting and reading:2 Watching TV:2 Sitting inactive in a public place (ex. Theater or meeting):0 As a passenger in a car for an hour without a break:0 Lying down to rest in the afternoon:2 Sitting and talking to someone:3 Sitting quietly after lunch (no alcohol):2 In a car, while stopped in traffic:0 Total:11     Objective:  Neurological Exam  Physical Exam Physical Examination:   Vitals:   02/23/20 1035  BP: 127/89  Pulse: 89   General Examination: The patient is a very pleasant 34 y.o. female in no acute distress. She appears well-developed and well-nourished and well groomed.   HEENT: Normocephalic, atraumatic, pupils are equal, round and reactive to light and accommodation. Funduscopic exam is normal with sharp disc margins noted. Extraocular tracking is good without limitation to gaze excursion or nystagmus noted. Normal smooth pursuit is noted. Hearing is grossly intact. Face is symmetric with normal facial animation and normal facial sensation. Speech is clear with no dysarthria noted. There is no hypophonia. There is no lip, neck/head, jaw or voice tremor. Neck is supple with full range of passive and active motion. There are no carotid bruits on auscultation. Toni Arthurs appearing thyroid gland and anterior neck soft tissue. Oropharynx exam reveals: mild mouth dryness, adequate dental hygiene and moderate airway crowding secondary to small airway entry, wider tongue, Mallampati class II, neck circumference of 16 and three-quarter inches.  Tongue protrudes centrally in palate elevates symmetrically.   Chest: Clear to auscultation without wheezing, rhonchi or crackles noted.  Heart: S1+S2+0, regular and normal without murmurs, rubs or gallops noted.   Abdomen: Soft, non-tender and non-distended with normal bowel sounds appreciated on auscultation.  Extremities: There is no pitting edema in the distal lower extremities bilaterally.    Skin: Warm and dry without trophic changes noted.  Musculoskeletal: exam reveals no obvious joint deformities, tenderness or joint swelling or erythema.   Neurologically:  Mental status: The patient is awake, alert and oriented in all 4 spheres. Her immediate and remote memory, attention, language skills and fund of knowledge are appropriate. There is no evidence of aphasia, agnosia, apraxia or anomia. Speech is  clear with normal prosody and enunciation. Thought process is linear. Mood is normal and affect is constricted.  Cranial nerves II - XII are as described above under HEENT exam. In addition: shoulder shrug is normal with equal shoulder height noted. Motor exam: Normal bulk, strength and tone is noted. There is no drift, tremor or rebound. Romberg is negative. Reflexes are 2+ throughout. Babinski: Toes are flexor bilaterally. Fine motor skills and coordination: intact with normal finger taps, normal hand movements, normal rapid alternating patting, normal foot taps and normal foot agility.  Cerebellar testing: No dysmetria or intention tremor on finger to nose testing. Heel to shin is unremarkable bilaterally. There is no truncal or gait ataxia.  Sensory exam: intact to light touch in the upper and lower extremities.  Gait, station and balance: She stands easily. No veering to one side is noted. No leaning to one side is noted. Posture is age-appropriate and stance is narrow based. Gait shows normal stride length and normal pace. No problems turning are noted. Tandem walk is unremarkable.   Assessment and Plan:   In summary, BETSAIDA MISSOURI is a very pleasant 34 y.o.-year old female with an underlying medical history of reflux disease, irritable bowel syndrome, thyroid disease, gestational diabetes, anxiety, anemia, and obesity, who presents as a referral from the emergency room for new onset headaches which started about 2 weeks ago.  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  We talked about the importance of good hydration, getting enough rest, she is agreeable to proceeding with a sleep study to rule out obstructive sleep apnea.  Given the new onset headache with left-sided localization primarily I would like to exclude a structural cause of her headaches with a brain MRI with and without contrast.  She had a recent head CT without contrast which showed no acute findings thankfully.  Neurological exam is nonfocal which is reassuring.  She is advised try the Imitrex, she has not yet picked up the prescription that she was given in the ER.  In addition, she can try low-dose Flexeril for tension headache, she is advised to take it sparingly at bedtime only for now.  I provided a new prescription, written instructions as well.  She is also advised that it can be sedating and that she should not drive after taking the Flexeril.  If she has obstructive sleep apnea, she is advised that she would likely benefit from CPAP or AutoPap therapy.  She is advised to follow-up routinely to see one of our nurse practitioners in 3 months and we will keep her posted as to her sleep study results and brain MRI results by phone call.  She is advised to make a follow-up appointment with her primary care physician for evaluation of her thyroid gland and possible goiter.  I answered all their questions today and the patient and her fianc were in agreement. Star Age, MD, PhD

## 2020-02-23 NOTE — Patient Instructions (Signed)
Your headache may be from migraines, tension headaches and there is also possibility that you have obstructive sleep apnea as you do snore and wake up with a headache and you have a family history of obstructive sleep apnea.  Your neurological exam is normal thankfully.  As discussed, due to your recent onset of headaches we will proceed with a brain MRI with and without contrast.  We will call you to schedule the test and call you with the results.  We will also schedule a sleep study to rule out obstructive sleep apnea.  If you have obstructive sleep apnea we will likely benefit from treatment with a CPAP or AutoPap machine.  For tension type headaches I suggested a trial of low-dose muscle relaxer at night as needed.  I have prescribed Flexeril for you.  This medication can make you sleepy.  Please do not drive after taking this.  For acute flareup of your migraines you can try the sumatriptan which was prescribed by the ER physician. You have not yet picked it up.  We will keep you posted as to your sleep study results as well.  Please follow-up in 3 months to see one of our nurse practitioners.

## 2020-02-23 NOTE — Telephone Encounter (Signed)
medicaid order sent to GI. They will obtain the auth and reach out to the patient to schedule.  °

## 2020-02-27 ENCOUNTER — Other Ambulatory Visit: Payer: Medicaid Other

## 2020-02-29 ENCOUNTER — Emergency Department (HOSPITAL_COMMUNITY): Payer: Medicaid Other

## 2020-02-29 ENCOUNTER — Encounter (HOSPITAL_COMMUNITY): Payer: Self-pay

## 2020-02-29 ENCOUNTER — Other Ambulatory Visit: Payer: Self-pay

## 2020-02-29 ENCOUNTER — Emergency Department (HOSPITAL_COMMUNITY)
Admission: EM | Admit: 2020-02-29 | Discharge: 2020-02-29 | Disposition: A | Discharge status: Home / Self Care | Payer: Medicaid Other | Attending: Emergency Medicine | Admitting: Emergency Medicine

## 2020-02-29 DIAGNOSIS — Z79899 Other long term (current) drug therapy: Secondary | ICD-10-CM | POA: Diagnosis not present

## 2020-02-29 DIAGNOSIS — Z7722 Contact with and (suspected) exposure to environmental tobacco smoke (acute) (chronic): Secondary | ICD-10-CM | POA: Diagnosis not present

## 2020-02-29 DIAGNOSIS — F419 Anxiety disorder, unspecified: Secondary | ICD-10-CM

## 2020-02-29 DIAGNOSIS — R419 Unspecified symptoms and signs involving cognitive functions and awareness: Secondary | ICD-10-CM | POA: Insufficient documentation

## 2020-02-29 DIAGNOSIS — Z91040 Latex allergy status: Secondary | ICD-10-CM | POA: Insufficient documentation

## 2020-02-29 DIAGNOSIS — R519 Headache, unspecified: Secondary | ICD-10-CM | POA: Insufficient documentation

## 2020-02-29 LAB — URINALYSIS, ROUTINE W REFLEX MICROSCOPIC
Bilirubin Urine: NEGATIVE
Glucose, UA: NEGATIVE mg/dL
Hgb urine dipstick: NEGATIVE
Ketones, ur: NEGATIVE mg/dL
Leukocytes,Ua: NEGATIVE
Nitrite: NEGATIVE
Protein, ur: 30 mg/dL — AB
Specific Gravity, Urine: 1.016 (ref 1.005–1.030)
pH: 6 (ref 5.0–8.0)

## 2020-02-29 LAB — COMPREHENSIVE METABOLIC PANEL
ALT: 16 U/L (ref 0–44)
AST: 14 U/L — ABNORMAL LOW (ref 15–41)
Albumin: 3.6 g/dL (ref 3.5–5.0)
Alkaline Phosphatase: 60 U/L (ref 38–126)
Anion gap: 9 (ref 5–15)
BUN: 7 mg/dL (ref 6–20)
CO2: 23 mmol/L (ref 22–32)
Calcium: 9.4 mg/dL (ref 8.9–10.3)
Chloride: 105 mmol/L (ref 98–111)
Creatinine, Ser: 0.72 mg/dL (ref 0.44–1.00)
GFR calc Af Amer: >60 mL/min (ref >60)
GFR calc non Af Amer: >60 mL/min (ref >60)
Glucose, Bld: 141 mg/dL — ABNORMAL HIGH (ref 70–99)
Potassium: 3.8 mmol/L (ref 3.5–5.1)
Sodium: 137 mmol/L (ref 135–145)
Total Bilirubin: 0.6 mg/dL (ref 0.3–1.2)
Total Protein: 7.8 g/dL (ref 6.5–8.1)

## 2020-02-29 LAB — CBC
HCT: 36.8 % (ref 36.0–46.0)
Hemoglobin: 11 g/dL — ABNORMAL LOW (ref 12.0–15.0)
MCH: 23.2 pg — ABNORMAL LOW (ref 26.0–34.0)
MCHC: 29.9 g/dL — ABNORMAL LOW (ref 30.0–36.0)
MCV: 77.6 fL — ABNORMAL LOW (ref 80.0–100.0)
Platelets: 398 10*3/uL (ref 150–400)
RBC: 4.74 MIL/uL (ref 3.87–5.11)
RDW: 17.7 % — ABNORMAL HIGH (ref 11.5–15.5)
WBC: 8.4 10*3/uL (ref 4.0–10.5)
nRBC: 0 % (ref 0.0–0.2)

## 2020-02-29 LAB — I-STAT BETA HCG BLOOD, ED (MC, WL, AP ONLY): I-stat hCG, quantitative: <5 m[IU]/mL (ref <5)

## 2020-02-29 LAB — LIPASE, BLOOD: Lipase: 22 U/L (ref 11–51)

## 2020-02-29 MED ORDER — LORAZEPAM 1 MG PO TABS
1.0000 mg | ORAL_TABLET | Freq: Once | ORAL | Status: AC
  Administered 2020-02-29: 1 mg via ORAL
  Filled 2020-02-29: qty 1

## 2020-02-29 MED ORDER — SODIUM CHLORIDE 0.9% FLUSH: 3.0000 mL | Freq: Once | INTRAVENOUS | Status: DC

## 2020-02-29 MED ORDER — LORAZEPAM 1 MG PO TABS
1.0000 mg | ORAL_TABLET | Freq: Three times a day (TID) | ORAL | 0 refills | Status: DC | PRN
Start: 2020-02-29 — End: 2020-03-21

## 2020-02-29 NOTE — Discharge Instructions (Signed)
The MRI showed some changes in the left frontal part of the brain, which can be seen with complex migraine headaches.  Please call your neurologist for an appointment to be seen to discuss these results and any treatments which could be helpful.  We are prescribing some lorazepam to help with symptoms of anxiety, which might improve your headaches.  You can also try using some heat on the sore area of the left side of your head to help the discomfort.

## 2020-02-29 NOTE — ED Notes (Signed)
Pt went to mri 

## 2020-02-29 NOTE — ED Triage Notes (Signed)
Patient complains of abdominal cramping with diarrhea x 6 days. States was taking amoxicillin for ear infection and unsure if related. Alert and oriented.

## 2020-02-29 NOTE — ED Provider Notes (Signed)
[facility] EMERGENCY DEPARTMENT Provider Note   CSN: [id number] Arrival date & time: 02/29/20  7673     History No chief complaint on file.   Melinda Jimenez is a 34 y.o. female.  HPI She is here for evaluation of ongoing symptoms of headache for greater than 2 months.  She was seen by neurology, 6 days ago and after evaluation an MRI of the brain was ordered to evaluate for structural abnormalities contributing to headaches.  Patient was scheduled for MRI, 2 days ago but because she felt ill decided not to go for that test.  She also explains that she is nervous about the MRI, because of "what it might show."  A sleep study was also ordered, to follow-up on snoring, but has not yet been scheduled.  Patient admits to "stress," but is vague about what it might be caused by.  She states she has numerous family members who are supportive of her.  She has previously been on depression medication, 3 years ago but not currently taking it.  She admits that she needs to "see a therapist."  She denies fever, chills, vomiting or weakness.  She has been sporadically taking amoxicillin because of a left ear problem, diagnosed and treated on 02/20/2020.  She states that she continues to have pain in the ear and the left side of her head.  There are no other known modifying factors.    Past Medical History:  Diagnosis Date  . Anemia   . Anxiety    off meds with preg  . Carpal tunnel syndrome   . Chlamydia   . Genital HSV    Last OB: > 93yr ago; Valtrex 500 mg po prn outbreaks  . GERD (gastroesophageal reflux disease)   . Gestational diabetes    diet controlled  . Headache(784.0)   . IBS (irritable bowel syndrome)   . Infection    UTI  . Mood swings   . Panic attack   . Thyroid disease   . Trichomonas     Patient Active Problem List   Diagnosis Date Noted  . 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 H/O: cesarean section 09/27/2016  .  GERD without esophagitis 01/20/2013  . Anxiety and depression 01/20/2013  . CARPAL TUNNEL SYNDROME 06/02/2010  . Vitamin D deficiency 04/19/2010  . Herpes genitalis in women 04/18/2010  . Goiter, unspecified 04/18/2010    Past Surgical History:  Procedure Laterality Date  . CESAREAN SECTION N/A 05/20/2013   Procedure: CESAREAN SECTION;  Surgeon: Brock Bad, MD;  Location: [facility];  Service: Obstetrics;  Laterality: N/A;  . CESAREAN SECTION N/A 04/06/2017   Procedure: CESAREAN SECTION;  Surgeon: Hermina Staggers, MD;  Location: [facility] BIRTHING SUITES;  Service: Obstetrics;  Laterality: N/A;  . INDUCED ABORTION  2009  . VAGINAL DELIVERY  2007     OB History    Gravida  9   Para  4   Term  3   Preterm  1   AB  5   Living  3     SAB  2   TAB  3   Ectopic      Multiple  0   Live Births  3           Family History  Problem Relation Age of Onset  . Depression Mother   . Anxiety disorder Mother   . Breast cancer Other   . Bipolar disorder Brother   .  Diabetes Maternal Grandmother   . Cancer Maternal Grandmother   . Hypertension Maternal Grandmother   . Hypertension Paternal Grandmother   . Autism Other   . Schizophrenia Other     Social History   Tobacco Use  . Smoking status: Passive Smoke Exposure - Never Smoker  . Smokeless tobacco: Never Used  . Tobacco comment: was not a daily smoker, tried it  Substance Use Topics  . Alcohol use: No    Comment: Twice a month  . Drug use: No    Home Medications Prior to Admission medications   Medication Sig Start Date End Date Taking? Authorizing Provider  Ascorbic Acid (VITAMIN C PO) Take 1 tablet by mouth daily.   Yes [provider]  Multiple Vitamins-Minerals (ZINC PO) Take 1 tablet by mouth daily.   Yes [provider]  valACYclovir (VALTREX) 1000 MG tablet Take 1,000 mg by mouth as needed (cold sores).  12/29/19  Yes [provider]  cyclobenzaprine (FLEXERIL) 5 MG tablet Take  2 tablets (10 mg total) by mouth at bedtime as needed for muscle spasms. Patient not taking: Reported on 02/29/2020 02/23/20   Huston FoleyAthar, Saima, MD  LORazepam (ATIVAN) 1 MG tablet Take 1 tablet (1 mg total) by mouth 3 (three) times daily as needed for anxiety. 02/29/20   Mancel BaleWentz, Kyreese Chio, MD  SUMAtriptan (IMITREX) 50 MG tablet Take 1 tablet (50 mg total) by mouth every 2 (two) hours as needed for migraine. May repeat in 2 hours if headache persists or recurs. Patient not taking: Reported on 02/17/2020 02/16/20   Gailen ShelterFondaw, Wylder S, PA  fluticasone (FLONASE) 50 MCG/ACT nasal spray Place 1 spray into both nostrils daily. Use for 2-3 weeks to help with the nasal congestion and ear pain Patient not taking: Reported on 01/21/2020 01/20/20 02/11/20  Gwyneth SproutPlunkett, Whitney, MD  sertraline (ZOLOFT) 25 MG tablet Take one tablet daily. If tolerating, increase to two tablets in one week. Patient not taking: Reported on 01/21/2020 01/18/20 02/11/20  Arvilla MarketWallace, Catherine Lauren, DO  zolpidem (AMBIEN) 5 MG tablet Take 1 tablet (5 mg total) by mouth at bedtime as needed for sleep. Patient not taking: Reported on 01/21/2020 01/20/20 02/11/20  Gwyneth SproutPlunkett, Whitney, MD    Allergies    Zofran Frazier Richards[ondansetron hcl] and Latex  Review of Systems   Review of Systems  All other systems reviewed and are negative.   Physical Exam Updated Vital Signs BP (!) 124/93 (BP Location: Right Arm)   Pulse 83   Temp 98.6 F (37 C) (Oral)   Ht 5\' 8"  (1.727 m)   Wt 103.9 kg   LMP 02/09/2020   SpO2 99%   BMI 34.82 kg/m   Physical Exam Vitals and nursing note reviewed.  Constitutional:      General: She is not in acute distress.    Appearance: She is well-developed. She is obese. She is not ill-appearing, toxic-appearing or diaphoretic.  HENT:     Head: Normocephalic and atraumatic.     Right Ear: External ear normal.     Left Ear: External ear normal.  Eyes:     Conjunctiva/sclera: Conjunctivae normal.     Pupils: Pupils are equal, round, and  reactive to light.  Neck:     Trachea: Phonation normal.  Cardiovascular:     Rate and Rhythm: Normal rate and regular rhythm.     Heart sounds: Normal heart sounds.  Pulmonary:     Effort: Pulmonary effort is normal.     Breath sounds: Normal breath sounds.  Abdominal:     General: There is no distension.     Palpations: Abdomen is soft.     Tenderness: There is no abdominal tenderness.  Musculoskeletal:        General: Normal range of motion.     Cervical back: Normal range of motion and neck supple.  Skin:    General: Skin is warm and dry.  Neurological:     Mental Status: She is alert and oriented to person, place, and time.     Cranial Nerves: No cranial nerve deficit.     Sensory: No sensory deficit.     Motor: No abnormal muscle tone.     Coordination: Coordination normal.     Comments: No dysarthria or aphasia.  No nystagmus.  Psychiatric:        Behavior: Behavior normal.        Thought Content: Thought content normal.        Judgment: Judgment normal.     Comments: She appears depressed, and frequently sighs during the evaluation.     ED Results / Procedures / Treatments   Labs (all labs ordered are listed, but only abnormal results are displayed) Labs Reviewed  COMPREHENSIVE METABOLIC PANEL - Abnormal; Notable for the following components:      Result Value   Glucose, Bld 141 (*)    AST 14 (*)    All other components within normal limits  CBC - Abnormal; Notable for the following components:   Hemoglobin 11.0 (*)    MCV 77.6 (*)    MCH 23.2 (*)    MCHC 29.9 (*)    RDW 17.7 (*)    All other components within normal limits  URINALYSIS, ROUTINE W REFLEX MICROSCOPIC - Abnormal; Notable for the following components:   APPearance HAZY (*)    Protein, ur 30 (*)    Bacteria, UA FEW (*)    All other components within normal limits  LIPASE, BLOOD  I-STAT BETA HCG BLOOD, ED ([facility], [facility], [facility] ONLY)    EKG None  Radiology MR BRAIN WO CONTRAST  Result Date:  02/29/2020 CLINICAL DATA:  Chronic headache EXAM: MRI HEAD WITHOUT CONTRAST TECHNIQUE: Multiplanar, multiecho pulse sequences of the brain and surrounding structures were obtained without intravenous contrast. COMPARISON:  CT head 01/01/2020 FINDINGS: Brain: Image quality degraded by motion Ventricle size normal. Cerebral volume normal. Negative for acute infarct, hemorrhage, mass. Few small hyperintensities in the left frontal white matter. Brainstem normal. Vascular: Normal arterial flow voids Skull and upper cervical spine: No focal skeletal lesion. Sinuses/Orbits: Negative Other: None IMPRESSION: No acute abnormality. Few small deep white matter hyperintensity in the left frontal white matter may be related to complex migraine headaches or chronic ischemia. Electronically Signed   By: Marlan Palau M.D.   On: 02/29/2020 15:19    Procedures Procedures (including critical care time)  Medications Ordered in ED Medications  sodium chloride flush (NS) 0.9 % injection 3 mL (3 mLs Intravenous Not Given 02/29/20 1111)  LORazepam (ATIVAN) tablet 1 mg (1 mg Oral Given 02/29/20 1346)    ED Course  I have reviewed the triage vital signs and the nursing notes.  Pertinent labs & imaging results that were available during my care of the patient were reviewed by me and considered in my medical decision making (see chart for details).  Clinical Course as of Feb 28 1618  Mon Feb 29, 2020  1128 Normal  I-Stat beta hCG blood, ED [EW]  1128 Normal  Lipase, blood [EW]  1128 Normal except hemoglobin low, MCV low  CBC(!) [EW]  1128 Normal except glucose high, AST low  Comprehensive metabolic panel(!) [EW]  1129 Normal except presence of protein  Urinalysis, Routine w reflex microscopic(!) [EW]  1541 Per radiologist, findings possibly consistent with migraine versus chronic ischemia.  No other abnormalities or apparent causes for pain.  MR BRAIN WO CONTRAST [EW]    Clinical Course User Index [EW] Mancel Bale, MD   MDM Rules/Calculators/A&P                       Patient Vitals for the past 24 hrs:  BP Temp Temp src Pulse SpO2 Height Weight  02/29/20 0956 (!) 124/93 98.6 F (37 C) Oral 83 99 % 5\' 8"  (1.727 m) 103.9 kg    4:19 PM Reevaluation with update and discussion. After initial assessment and treatment, an updated evaluation reveals she appears comfortable, and tolerate the MRI.  Her significant other with her, wonders if we can treat her for anxiety, and the patient agrees.  She was moderately anxious talking about results of the MRI, and exact treatments required for control of migraine symptoms.  I explained that it would be best to meet with her neurologist to discuss the MRI results since she had initially ordered the MRI.  Patient was agreeable.  All questions answered.   Medical Decision Making:  This patient is presenting for evaluation of headaches which are chronic, which does require a range of treatment options, and is a complaint that involves a moderate risk of morbidity and mortality. The differential diagnoses include tension headache, migraine headache, intracranial abnormality causing pain. I decided to review old records, and in summary healthy young female with ongoing chronic symptoms of headache.  No red flag warnings.  I obtained additional historical information from her significant other at the bedside.  Clinical Laboratory Tests Ordered, included CBC, Metabolic panel, Urinalysis and Pregnancy test, lipase. Review indicates normal except mild iron deficiency anemia, and hyperglycemia. Radiologic Tests Ordered, included MRI brain changes consistent with complex migraine, no acute abnormalities.  I independently Visualized: Radiologic images, which show no stroke, no tumor, nonspecific white matter changes, left frontal  Cardiac Monitor Tracing which shows normal sinus rhythm    Critical Interventions-clinical evaluation, laboratory testing, MRI  imaging, observation reassessment  After These Interventions, the Patient was reevaluated and was found stable for discharge.  Headache complex is nonspecific, and ongoing.  Possible migraine related.  Possible tension related with anxiety which is mild.  Doubt otitis media at this time.  No indication for further ED evaluation or hospitalization, at this time  CRITICAL CARE-no Performed by: Mancel Bale  Nursing Notes Reviewed/ Care Coordinated Applicable Imaging Reviewed Interpretation of Laboratory Data incorporated into ED treatment  The patient appears reasonably screened and/or stabilized for discharge and I doubt any other medical condition or other [facility] requiring further screening, evaluation, or treatment in the ED at this time prior to discharge.  Plan: Home Medications-stop taking antibiotics for infection, continue usual; Home Treatments-try heat applications to left side of head and ear to see if that improves the current condition; return here if the recommended treatment, does not improve the symptoms; Recommended follow up-neurology follow-up as soon as possible to discuss MRI results and ongoing management     Final Clinical Impression(s) / ED Diagnoses Final diagnoses:  Nonintractable headache, unspecified chronicity pattern, unspecified headache type  Anxiety    Rx / DC Orders ED Discharge Orders  Ordered    LORazepam (ATIVAN) 1 MG tablet  3 times daily PRN     02/29/20 1618           Daleen Bo, MD 02/29/20 1635

## 2020-03-01 ENCOUNTER — Telehealth: Payer: Self-pay | Admitting: Neurology

## 2020-03-01 NOTE — Telephone Encounter (Signed)
I reached out to the pt and advised of recommendation. She verbalized understanding and will wait to hear from the sleep lab on scheduling sleep study.

## 2020-03-01 NOTE — Telephone Encounter (Signed)
Please advise patient there were no acute findings on the brain MRI, she has chronic changes that are seen sometimes with patients with chronic headaches.  I had ordered her brain MRI with and without contrast, the ER did an MRI without contrast only.    We can certainly consider repeating her brain MRI with and without contrast down the road.  As discussed, we will proceed with sleep study testing and she is also encouraged to follow-up in 3 months to see one of our nurse practitioners.

## 2020-03-01 NOTE — Telephone Encounter (Signed)
Pt has called to report that she has been advised that from the MRI she had on yesterday there is something wrong at the front of her brain. Pt asked to schedule an appointment, pt was told of when Dr Frances Furbish wanted her to f/u with NP and pt states she can not wait until Aug based on results from MRI. Please call pt re: what Dr Frances Furbish wants to do as a result of the MRI findings.

## 2020-03-01 NOTE — Telephone Encounter (Signed)
Pt went the ED yesterday and had MRI completed.  Impression states IMPRESSION: No acute abnormality. Few small deep white matter hyperintensity in the left frontal white matter may be related to complex migraine headaches or chronic ischemia.  Will fwd to MD to review and advise on.

## 2020-03-02 ENCOUNTER — Institutional Professional Consult (permissible substitution): Payer: Medicaid Other | Admitting: Licensed Clinical Social Worker

## 2020-03-04 ENCOUNTER — Institutional Professional Consult (permissible substitution): Payer: Medicaid Other | Admitting: Licensed Clinical Social Worker

## 2020-03-10 ENCOUNTER — Other Ambulatory Visit: Payer: Self-pay

## 2020-03-10 ENCOUNTER — Emergency Department (HOSPITAL_COMMUNITY)
Admission: EM | Admit: 2020-03-10 | Discharge: 2020-03-10 | Disposition: A | Discharge status: Home / Self Care | Payer: Medicaid Other | Attending: Emergency Medicine | Admitting: Emergency Medicine

## 2020-03-10 DIAGNOSIS — J029 Acute pharyngitis, unspecified: Secondary | ICD-10-CM | POA: Insufficient documentation

## 2020-03-10 DIAGNOSIS — Z7722 Contact with and (suspected) exposure to environmental tobacco smoke (acute) (chronic): Secondary | ICD-10-CM | POA: Insufficient documentation

## 2020-03-10 DIAGNOSIS — Z91040 Latex allergy status: Secondary | ICD-10-CM | POA: Diagnosis not present

## 2020-03-10 NOTE — ED Triage Notes (Signed)
C/o ear pain, sore throat, difficulty breathing, and no appetite.,  Has not been taking amoxicillan -- had been dx with ear infection -- seen here on 02/29/2020 was told that ear infection was cleared.

## 2020-03-10 NOTE — ED Provider Notes (Signed)
[facility] EMERGENCY DEPARTMENT Provider Note   CSN: [id number] Arrival date & time: 03/10/20  0946     History Chief Complaint  Patient presents with  . Otalgia  . Sore Throat    Melinda Jimenez is a 34 y.o. female presenting to the emergency department with 3 days of sore throat.  Pain is worse with swallowing.  She has associated decreased appetite.  Denies known Covid contacts.  She has had persistent left ear pain since middle of May where she was treated for an ear infection.  She has not treated her symptoms with any medications.   Denies fevers or chills, dental pain, rhinorrhea, nasal congestion, cough. She has not followed up with her primary care.  She was seen in the ED on Feb 29, 2020 for headache.   The history is provided by the patient.       Past Medical History:  Diagnosis Date  . Anemia   . Anxiety    off meds with preg  . Carpal tunnel syndrome   . Chlamydia   . Genital HSV    Last OB: > 52yr ago; Valtrex 500 mg po prn outbreaks  . GERD (gastroesophageal reflux disease)   . Gestational diabetes    diet controlled  . Headache(784.0)   . IBS (irritable bowel syndrome)   . Infection    UTI  . Mood swings   . Panic attack   . Thyroid disease   . Trichomonas     Patient Active Problem List   Diagnosis Date Noted  . 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 H/O: cesarean section 09/27/2016  . GERD without esophagitis 01/20/2013  . Anxiety and depression 01/20/2013  . CARPAL TUNNEL SYNDROME 06/02/2010  . Vitamin D deficiency 04/19/2010  . Herpes genitalis in women 04/18/2010  . Goiter, unspecified 04/18/2010    Past Surgical History:  Procedure Laterality Date  . CESAREAN SECTION N/A 05/20/2013   Procedure: CESAREAN SECTION;  Surgeon: Brock Bad, MD;  Location: [facility]S;  Service: Obstetrics;  Laterality: N/A;  . CESAREAN SECTION N/A 04/06/2017   Procedure: CESAREAN SECTION;  Surgeon:  Hermina Staggers, MD;  Location: [facility] BIRTHING SUITES;  Service: Obstetrics;  Laterality: N/A;  . INDUCED ABORTION  2009  . VAGINAL DELIVERY  2007     OB History    Gravida  9   Para  4   Term  3   Preterm  1   AB  5   Living  3     SAB  2   TAB  3   Ectopic      Multiple  0   Live Births  3           Family History  Problem Relation Age of Onset  . Depression Mother   . Anxiety disorder Mother   . Breast cancer Other   . Bipolar disorder Brother   . Diabetes Maternal Grandmother   . Cancer Maternal Grandmother   . Hypertension Maternal Grandmother   . Hypertension Paternal Grandmother   . Autism Other   . Schizophrenia Other     Social History   Tobacco Use  . Smoking status: Passive Smoke Exposure - Never Smoker  . Smokeless tobacco: Never Used  . Tobacco comment: was not a daily smoker, tried it  Substance Use Topics  . Alcohol use: No    Comment: Twice a month  . Drug use: No  Home Medications Prior to Admission medications   Medication Sig Start Date End Date Taking? Authorizing Provider  Ascorbic Acid (VITAMIN C PO) Take 1 tablet by mouth daily.    [provider]  cyclobenzaprine (FLEXERIL) 5 MG tablet Take 2 tablets (10 mg total) by mouth at bedtime as needed for muscle spasms. Patient not taking: Reported on 02/29/2020 02/23/20   Huston Foley, MD  LORazepam (ATIVAN) 1 MG tablet Take 1 tablet (1 mg total) by mouth 3 (three) times daily as needed for anxiety. 02/29/20   Mancel Bale, MD  Multiple Vitamins-Minerals (ZINC PO) Take 1 tablet by mouth daily.    [provider]  SUMAtriptan (IMITREX) 50 MG tablet Take 1 tablet (50 mg total) by mouth every 2 (two) hours as needed for migraine. May repeat in 2 hours if headache persists or recurs. Patient not taking: Reported on 02/17/2020 02/16/20   Gailen Shelter, PA  valACYclovir (VALTREX) 1000 MG tablet Take 1,000 mg by mouth as needed (cold sores).  12/29/19   [provider]  fluticasone (FLONASE) 50 MCG/ACT nasal spray Place 1 spray into both nostrils daily. Use for 2-3 weeks to help with the nasal congestion and ear pain Patient not taking: Reported on 01/21/2020 01/20/20 02/11/20  Gwyneth Sprout, MD  sertraline (ZOLOFT) 25 MG tablet Take one tablet daily. If tolerating, increase to two tablets in one week. Patient not taking: Reported on 01/21/2020 01/18/20 02/11/20  Arvilla Market, DO  zolpidem (AMBIEN) 5 MG tablet Take 1 tablet (5 mg total) by mouth at bedtime as needed for sleep. Patient not taking: Reported on 01/21/2020 01/20/20 02/11/20  Gwyneth Sprout, MD    Allergies    Zofran Frazier Richards hcl] and Latex  Review of Systems   Review of Systems  Constitutional: Negative for chills and fever.  HENT: Positive for ear pain and sore throat. Negative for dental problem.   Respiratory: Negative for cough.     Physical Exam Updated Vital Signs BP 128/71 (BP Location: Left Arm)   Pulse 92   Temp 98.6 F (37 C) (Oral)   Resp 15   LMP 02/09/2020   SpO2 99%   Physical Exam Vitals and nursing note reviewed.  Constitutional:      General: She is not in acute distress.    Appearance: She is well-developed.  HENT:     Head: Normocephalic and atraumatic.     Right Ear: Tympanic membrane, ear canal and external ear normal.     Left Ear: Tympanic membrane, ear canal and external ear normal.     Mouth/Throat:     Mouth: Mucous membranes are moist.     Pharynx: Oropharynx is clear. No oropharyngeal exudate.     Tonsils: No tonsillar exudate or tonsillar abscesses.     Comments: Mild oropharyngeal erythema present.  Uvula is midline, no trismus, tolerating secretions.  No edema.  No exudates. Eyes:     Conjunctiva/sclera: Conjunctivae normal.  Cardiovascular:     Rate and Rhythm: Normal rate and regular rhythm.     Heart sounds: Normal heart sounds.  Pulmonary:     Effort: Pulmonary effort is normal. No respiratory distress.      Breath sounds: Normal breath sounds.  Musculoskeletal:     Cervical back: Normal range of motion and neck supple.  Lymphadenopathy:     Cervical: No cervical adenopathy.  Neurological:     Mental Status: She is alert.  Psychiatric:        Mood and Affect:  Mood normal.        Behavior: Behavior normal.     ED Results / Procedures / Treatments   Labs (all labs ordered are listed, but only abnormal results are displayed) Labs Reviewed - No data to display  EKG None  Radiology No results found.  Procedures Procedures (including critical care time)  Medications Ordered in ED Medications - No data to display  ED Course  I have reviewed the triage vital signs and the nursing notes.  Pertinent labs & imaging results that were available during my care of the patient were reviewed by me and considered in my medical decision making (see chart for details).    MDM Rules/Calculators/A&P                          Pt presenting with symptoms consistent with likely viral pharyngitis versus viral URI.  Vital signs are stable, afebrile.  Lungs are clear bilaterally, no respiratory distress.  O2 saturation is 99% on room air.  Oropharynx with mild erythema, no exudates or edema.  Tolerating secretions.  Ears are clear.  Offered strep swab and Covid swab, however patient declined.  This is likely viral etiology, no abx indicated.  Will discharge with symptomatic management and recommend PCP follow-up.  Pt does not appear dehydrated, but did discuss importance of water rehydration. Specific return precautions discussed.    Final Clinical Impression(s) / ED Diagnoses Final diagnoses:  Viral pharyngitis    Rx / DC Orders ED Discharge Orders    None       Corrine Tillis, [place] N, PA-C 03/10/20 1056    Blanchie Dessert, MD 03/11/20 1848

## 2020-03-10 NOTE — Discharge Instructions (Addendum)
Please read instructions below.  You can take tylenol or ibuprofen as needed for sore throat or fever.  Drink plenty of water.  Use saline nasal spray for congestion. Follow up with your primary care provider regarding your persistent ear pain.  Return to the ER for inability to swallow liquids, difficulty breathing, or new or worsening symptoms.

## 2020-03-11 ENCOUNTER — Emergency Department (HOSPITAL_COMMUNITY): Payer: Medicaid Other

## 2020-03-11 ENCOUNTER — Other Ambulatory Visit: Payer: Self-pay

## 2020-03-11 ENCOUNTER — Emergency Department (HOSPITAL_COMMUNITY)
Admission: EM | Admit: 2020-03-11 | Discharge: 2020-03-11 | Disposition: A | Discharge status: Home / Self Care | Payer: Medicaid Other | Attending: Emergency Medicine | Admitting: Emergency Medicine

## 2020-03-11 DIAGNOSIS — Z91040 Latex allergy status: Secondary | ICD-10-CM | POA: Insufficient documentation

## 2020-03-11 DIAGNOSIS — Z7722 Contact with and (suspected) exposure to environmental tobacco smoke (acute) (chronic): Secondary | ICD-10-CM | POA: Diagnosis not present

## 2020-03-11 DIAGNOSIS — R1013 Epigastric pain: Secondary | ICD-10-CM | POA: Insufficient documentation

## 2020-03-11 DIAGNOSIS — Z79899 Other long term (current) drug therapy: Secondary | ICD-10-CM | POA: Insufficient documentation

## 2020-03-11 DIAGNOSIS — R109 Unspecified abdominal pain: Secondary | ICD-10-CM

## 2020-03-11 LAB — URINALYSIS, ROUTINE W REFLEX MICROSCOPIC
Bilirubin Urine: NEGATIVE
Glucose, UA: NEGATIVE mg/dL
Hgb urine dipstick: NEGATIVE
Ketones, ur: 20 mg/dL — AB
Leukocytes,Ua: NEGATIVE
Nitrite: NEGATIVE
Protein, ur: 30 mg/dL — AB
Specific Gravity, Urine: 1.011 (ref 1.005–1.030)
pH: 6 (ref 5.0–8.0)

## 2020-03-11 LAB — COMPREHENSIVE METABOLIC PANEL
ALT: 13 U/L (ref 0–44)
AST: 13 U/L — ABNORMAL LOW (ref 15–41)
Albumin: 3.6 g/dL (ref 3.5–5.0)
Alkaline Phosphatase: 49 U/L (ref 38–126)
Anion gap: 13 (ref 5–15)
BUN: <5 mg/dL — ABNORMAL LOW (ref 6–20)
CO2: 21 mmol/L — ABNORMAL LOW (ref 22–32)
Calcium: 9.2 mg/dL (ref 8.9–10.3)
Chloride: 103 mmol/L (ref 98–111)
Creatinine, Ser: 0.52 mg/dL (ref 0.44–1.00)
GFR calc Af Amer: >60 mL/min (ref >60)
GFR calc non Af Amer: >60 mL/min (ref >60)
Glucose, Bld: 143 mg/dL — ABNORMAL HIGH (ref 70–99)
Potassium: 3.4 mmol/L — ABNORMAL LOW (ref 3.5–5.1)
Sodium: 137 mmol/L (ref 135–145)
Total Bilirubin: 0.7 mg/dL (ref 0.3–1.2)
Total Protein: 7.5 g/dL (ref 6.5–8.1)

## 2020-03-11 LAB — LIPASE, BLOOD: Lipase: 21 U/L (ref 11–51)

## 2020-03-11 LAB — CBC
HCT: 35.4 % — ABNORMAL LOW (ref 36.0–46.0)
Hemoglobin: 10.7 g/dL — ABNORMAL LOW (ref 12.0–15.0)
MCH: 22.9 pg — ABNORMAL LOW (ref 26.0–34.0)
MCHC: 30.2 g/dL (ref 30.0–36.0)
MCV: 75.6 fL — ABNORMAL LOW (ref 80.0–100.0)
Platelets: 371 10*3/uL (ref 150–400)
RBC: 4.68 MIL/uL (ref 3.87–5.11)
RDW: 17.2 % — ABNORMAL HIGH (ref 11.5–15.5)
WBC: 9.8 10*3/uL (ref 4.0–10.5)
nRBC: 0 % (ref 0.0–0.2)

## 2020-03-11 LAB — I-STAT BETA HCG BLOOD, ED (MC, WL, AP ONLY): I-stat hCG, quantitative: 780.8 m[IU]/mL — ABNORMAL HIGH (ref <5)

## 2020-03-11 MED ORDER — SODIUM CHLORIDE 0.9% FLUSH: 3.0000 mL | Freq: Once | INTRAVENOUS | Status: DC

## 2020-03-11 MED ORDER — FAMOTIDINE 20 MG PO TABS
20.0000 mg | ORAL_TABLET | Freq: Two times a day (BID) | ORAL | 0 refills | Status: DC
Start: 2020-03-11 — End: 2020-03-21

## 2020-03-11 MED ORDER — FAMOTIDINE 20 MG PO TABS
20.0000 mg | ORAL_TABLET | Freq: Once | ORAL | Status: AC
  Administered 2020-03-11: 20 mg via ORAL
  Filled 2020-03-11: qty 1

## 2020-03-11 NOTE — ED Triage Notes (Signed)
Per pt she has been having abdominal pain that started tonight that radiates up into her chest with SOB.Pt said she did not have any nausea vomiting.

## 2020-03-11 NOTE — ED Provider Notes (Signed)
[facility] EMERGENCY DEPARTMENT Provider Note   CSN: [id number] Arrival date & time: 03/11/20  8937     History Chief Complaint  Patient presents with  . Abdominal Pain    Melinda Jimenez is a 34 y.o. female.  HPI   34 year old female with abdominal pain.  Onset yesterday.  Describes pain in her epigastrium radiating substernally.  Describes sensation as "pulling."  Constant.  No change after eating, movement or position.  No nausea or vomiting.  No urinary complaints.  Mild shortness of breath.  No fevers or chills.  She tried taking some type of muscle relaxant she was previously prescribed but this did not help with her symptoms.  Past Medical History:  Diagnosis Date  . Anemia   . Anxiety    off meds with preg  . Carpal tunnel syndrome   . Chlamydia   . Genital HSV    Last OB: > 23yr ago; Valtrex 500 mg po prn outbreaks  . GERD (gastroesophageal reflux disease)   . Gestational diabetes    diet controlled  . Headache(784.0)   . IBS (irritable bowel syndrome)   . Infection    UTI  . Mood swings   . Panic attack   . Thyroid disease   . Trichomonas     Patient Active Problem List   Diagnosis Date Noted  . 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 H/O: cesarean section 09/27/2016  . GERD without esophagitis 01/20/2013  . Anxiety and depression 01/20/2013  . CARPAL TUNNEL SYNDROME 06/02/2010  . Vitamin D deficiency 04/19/2010  . Herpes genitalis in women 04/18/2010  . Goiter, unspecified 04/18/2010    Past Surgical History:  Procedure Laterality Date  . CESAREAN SECTION N/A 05/20/2013   Procedure: CESAREAN SECTION;  Surgeon: Brock Bad, MD;  Location: [facility];  Service: Obstetrics;  Laterality: N/A;  . CESAREAN SECTION N/A 04/06/2017   Procedure: CESAREAN SECTION;  Surgeon: Hermina Staggers, MD;  Location: [facility] BIRTHING SUITES;  Service: Obstetrics;  Laterality: N/A;  . INDUCED ABORTION  2009  . VAGINAL  DELIVERY  2007     OB History    Gravida  9   Para  4   Term  3   Preterm  1   AB  5   Living  3     SAB  2   TAB  3   Ectopic      Multiple  0   Live Births  3           Family History  Problem Relation Age of Onset  . Depression Mother   . Anxiety disorder Mother   . Breast cancer Other   . Bipolar disorder Brother   . Diabetes Maternal Grandmother   . Cancer Maternal Grandmother   . Hypertension Maternal Grandmother   . Hypertension Paternal Grandmother   . Autism Other   . Schizophrenia Other     Social History   Tobacco Use  . Smoking status: Passive Smoke Exposure - Never Smoker  . Smokeless tobacco: Never Used  . Tobacco comment: was not a daily smoker, tried it  Substance Use Topics  . Alcohol use: No    Comment: Twice a month  . Drug use: No    Home Medications Prior to Admission medications   Medication Sig Start Date End Date Taking? Authorizing Provider  cyclobenzaprine (FLEXERIL) 5 MG tablet Take 2 tablets (10 mg total) by mouth at bedtime  as needed for muscle spasms. Patient taking differently: Take 10 mg by mouth daily as needed for muscle spasms.  02/23/20  Yes Huston Foley, MD  LORazepam (ATIVAN) 1 MG tablet Take 1 tablet (1 mg total) by mouth 3 (three) times daily as needed for anxiety. Patient taking differently: Take 1 mg by mouth daily as needed for anxiety.  02/29/20  Yes Mancel Bale, MD  SUMAtriptan (IMITREX) 50 MG tablet Take 1 tablet (50 mg total) by mouth every 2 (two) hours as needed for migraine. May repeat in 2 hours if headache persists or recurs. 02/16/20  Yes Fondaw, Wylder S, PA  valACYclovir (VALTREX) 1000 MG tablet Take 500 mg by mouth as needed (cold sores).  12/29/19  Yes [provider]  fluticasone (FLONASE) 50 MCG/ACT nasal spray Place 1 spray into both nostrils daily. Use for 2-3 weeks to help with the nasal congestion and ear pain Patient not taking: Reported on 01/21/2020 01/20/20 02/11/20  Gwyneth Sprout, MD  sertraline (ZOLOFT) 25 MG tablet Take one tablet daily. If tolerating, increase to two tablets in one week. Patient not taking: Reported on 01/21/2020 01/18/20 02/11/20  Arvilla Market, DO  zolpidem (AMBIEN) 5 MG tablet Take 1 tablet (5 mg total) by mouth at bedtime as needed for sleep. Patient not taking: Reported on 01/21/2020 01/20/20 02/11/20  Gwyneth Sprout, MD    Allergies    Zofran Frazier Richards hcl] and Latex  Review of Systems   Review of Systems All systems reviewed and negative, other than as noted in HPI.  Physical Exam Updated Vital Signs BP 122/82 (BP Location: Right Arm)   Pulse 95   Temp 98 F (36.7 C) (Oral)   Resp 16   Physical Exam Vitals and nursing note reviewed.  Constitutional:      General: She is not in acute distress.    Appearance: She is well-developed.  HENT:     Head: Normocephalic and atraumatic.  Eyes:     General:        Right eye: No discharge.        Left eye: No discharge.     Conjunctiva/sclera: Conjunctivae normal.  Cardiovascular:     Rate and Rhythm: Normal rate and regular rhythm.     Heart sounds: Normal heart sounds. No murmur heard.  No friction rub. No gallop.   Pulmonary:     Effort: Pulmonary effort is normal. No respiratory distress.     Breath sounds: Normal breath sounds.  Abdominal:     General: There is no distension.     Palpations: Abdomen is soft.     Tenderness: There is abdominal tenderness.     Comments: Mild tenderness to palpation epigastrium and to a lesser extent periumbilically.  No rebound or guarding.  No distention.  Musculoskeletal:        General: No tenderness.     Cervical back: Neck supple.  Skin:    General: Skin is warm and dry.  Neurological:     Mental Status: She is alert.  Psychiatric:        Behavior: Behavior normal.        Thought Content: Thought content normal.     ED Results / Procedures / Treatments   Labs (all labs ordered are listed, but only abnormal  results are displayed) Labs Reviewed  COMPREHENSIVE METABOLIC PANEL - Abnormal; Notable for the following components:      Result Value   Potassium 3.4 (*)    CO2 21 (*)  Glucose, Bld 143 (*)    BUN <5 (*)    AST 13 (*)    All other components within normal limits  CBC - Abnormal; Notable for the following components:   Hemoglobin 10.7 (*)    HCT 35.4 (*)    MCV 75.6 (*)    MCH 22.9 (*)    RDW 17.2 (*)    All other components within normal limits  URINALYSIS, ROUTINE W REFLEX MICROSCOPIC - Abnormal; Notable for the following components:   Ketones, ur 20 (*)    Protein, ur 30 (*)    Bacteria, UA RARE (*)    All other components within normal limits  I-STAT BETA HCG BLOOD, ED ([facility], [facility], [facility] ONLY) - Abnormal; Notable for the following components:   I-stat hCG, quantitative 780.8 (*)    All other components within normal limits  LIPASE, BLOOD    EKG EKG Interpretation  Date/Time:  Friday March 11 2020 06:41:57 EDT Ventricular Rate:  98 PR Interval:  134 QRS Duration: 76 QT Interval:  346 QTC Calculation: 441 R Axis:   47 Text Interpretation: Normal sinus rhythm Nonspecific T wave abnormality Abnormal ECG Confirmed by Virgel Manifold [phone]) on 03/11/2020 8:05:57 AM   Radiology No results found.  Procedures Procedures (including critical care time)  Medications Ordered in ED Medications  sodium chloride flush (NS) 0.9 % injection 3 mL (0 mLs Intravenous Hold 03/11/20 0811)  famotidine (PEPCID) tablet 20 mg (has no administration in time range)    ED Course  I have reviewed the triage vital signs and the nursing notes.  Pertinent labs & imaging results that were available during my care of the patient were reviewed by me and considered in my medical decision making (see chart for details).    MDM Rules/Calculators/A&P                          34 year old female with upper abdominal pain radiating up into her chest.  This may be esophagitis.  We will presumptively  placed on a H2 blocker.  I suspect this is probably not pregnancy related.  Symptoms seem to high but she does have some mild periumbilical tenderness.  Will obtain an ultrasound although with her quant only in the 700s we probably will be able to see all that much.  Patient seems to be in denial that she is actually pregnant.  When asked when her last menstrual period was she stated that she is not pregnant.  I explained test results to her but she kept insisting that she isn't pregnant.   Expect [place] will be equivocal at this point. Anticipate DC with either repeat quant hcg or [place] and OB FU. Take h2 blocker.  Final Clinical Impression(s) / ED Diagnoses Final diagnoses:  Epigastric pain    Rx / DC Orders ED Discharge Orders    None       Virgel Manifold, MD 03/13/20 1545

## 2020-03-11 NOTE — Discharge Instructions (Addendum)
Your ultrasound is inconclusive.  You are just too early in your pregnancy to be able to definitively tell if you have an ectopic pregnancy or the pregnancy is in your uterus.  Based on her symptoms though I am not convinced that this is pregnancy related.  I want you to start taking famotidine.  Because of pregnancy we are limited and pain treatment options.  Take Tylenol as needed.  Follow-up with your OB. You need to have repeat blood work (HCG level) or another ultrasound if you're having persistent symptoms.

## 2020-03-12 ENCOUNTER — Emergency Department (HOSPITAL_COMMUNITY)
Admission: EM | Admit: 2020-03-12 | Discharge: 2020-03-13 | Disposition: A | Discharge status: Home / Self Care | Payer: Medicaid Other | Attending: Emergency Medicine | Admitting: Emergency Medicine

## 2020-03-12 ENCOUNTER — Other Ambulatory Visit: Payer: Self-pay

## 2020-03-12 ENCOUNTER — Encounter (HOSPITAL_COMMUNITY): Payer: Self-pay | Admitting: Emergency Medicine

## 2020-03-12 ENCOUNTER — Emergency Department (HOSPITAL_COMMUNITY): Payer: Medicaid Other

## 2020-03-12 DIAGNOSIS — Z91040 Latex allergy status: Secondary | ICD-10-CM | POA: Insufficient documentation

## 2020-03-12 DIAGNOSIS — O99891 Other specified diseases and conditions complicating pregnancy: Secondary | ICD-10-CM | POA: Diagnosis not present

## 2020-03-12 DIAGNOSIS — O24410 Gestational diabetes mellitus in pregnancy, diet controlled: Secondary | ICD-10-CM | POA: Insufficient documentation

## 2020-03-12 DIAGNOSIS — Z7722 Contact with and (suspected) exposure to environmental tobacco smoke (acute) (chronic): Secondary | ICD-10-CM | POA: Diagnosis not present

## 2020-03-12 DIAGNOSIS — R05 Cough: Secondary | ICD-10-CM | POA: Insufficient documentation

## 2020-03-12 DIAGNOSIS — R0789 Other chest pain: Secondary | ICD-10-CM | POA: Diagnosis not present

## 2020-03-12 DIAGNOSIS — R0602 Shortness of breath: Secondary | ICD-10-CM | POA: Insufficient documentation

## 2020-03-12 DIAGNOSIS — Z3A01 Less than 8 weeks gestation of pregnancy: Secondary | ICD-10-CM

## 2020-03-12 DIAGNOSIS — Z3A00 Weeks of gestation of pregnancy not specified: Secondary | ICD-10-CM | POA: Diagnosis not present

## 2020-03-12 LAB — PROTIME-INR
INR: 1.1 (ref 0.8–1.2)
Prothrombin Time: 13.3 seconds (ref 11.4–15.2)

## 2020-03-12 LAB — CBC
HCT: 36.6 % (ref 36.0–46.0)
Hemoglobin: 11.2 g/dL — ABNORMAL LOW (ref 12.0–15.0)
MCH: 23.2 pg — ABNORMAL LOW (ref 26.0–34.0)
MCHC: 30.6 g/dL (ref 30.0–36.0)
MCV: 75.8 fL — ABNORMAL LOW (ref 80.0–100.0)
Platelets: 337 10*3/uL (ref 150–400)
RBC: 4.83 MIL/uL (ref 3.87–5.11)
RDW: 17.2 % — ABNORMAL HIGH (ref 11.5–15.5)
WBC: 11.8 10*3/uL — ABNORMAL HIGH (ref 4.0–10.5)
nRBC: 0 % (ref 0.0–0.2)

## 2020-03-12 MED ORDER — SODIUM CHLORIDE 0.9% FLUSH: 3.0000 mL | Freq: Once | INTRAVENOUS | Status: DC

## 2020-03-12 NOTE — ED Triage Notes (Signed)
Patient arrived with EMS from home reports intermittent right upper chest pain this evening with mild SOB and dry cough. Denies emesis or diaphoresis .

## 2020-03-13 LAB — TROPONIN I (HIGH SENSITIVITY): Troponin I (High Sensitivity): 4 ng/L (ref <18)

## 2020-03-13 LAB — BASIC METABOLIC PANEL
Anion gap: 10 (ref 5–15)
BUN: <5 mg/dL — ABNORMAL LOW (ref 6–20)
CO2: 23 mmol/L (ref 22–32)
Calcium: 9.1 mg/dL (ref 8.9–10.3)
Chloride: 103 mmol/L (ref 98–111)
Creatinine, Ser: 0.66 mg/dL (ref 0.44–1.00)
GFR calc Af Amer: >60 mL/min (ref >60)
GFR calc non Af Amer: >60 mL/min (ref >60)
Glucose, Bld: 112 mg/dL — ABNORMAL HIGH (ref 70–99)
Potassium: 3.2 mmol/L — ABNORMAL LOW (ref 3.5–5.1)
Sodium: 136 mmol/L (ref 135–145)

## 2020-03-13 LAB — I-STAT BETA HCG BLOOD, ED (MC, WL, AP ONLY): I-stat hCG, quantitative: 1439.1 m[IU]/mL — ABNORMAL HIGH (ref <5)

## 2020-03-13 NOTE — ED Notes (Signed)
Patient verbalizes understanding of discharge instructions. Opportunity for questioning and answers were provided. Armband removed by staff, pt discharged from ED. Pt. ambulatory and discharged home.  

## 2020-03-13 NOTE — ED Notes (Signed)
Pt back from outside

## 2020-03-13 NOTE — ED Provider Notes (Addendum)
[facility] EMERGENCY DEPARTMENT Provider Note   CSN: [id number] Arrival date & time: 03/12/20  2252     History Chief Complaint  Patient presents with  . Chest Pain    Melinda Jimenez is a 34 y.o. female.  Patient is a 34 year old female with history of anxiety, anemia, irritable bowel, gestational diabetes, obesity, and depression.  She presents today for evaluation of chest discomfort.  She described discomfort to her right upper chest with shortness of breath and cough.  She denies experiencing any fever.  Patient was seen here 2 days ago with abdominal pain.  She was found to have a positive pregnancy test but no definitive IUP as her pregnancy was early on.  Her abdominal pain has resolved, but now chest pain is the complaint.  The history is provided by the patient.  Chest Pain Pain location:  Substernal area Pain quality: tightness   Pain radiates to:  Does not radiate Pain severity:  Moderate Onset quality:  Sudden Duration:  2 days Timing:  Constant Progression:  Worsening Chronicity:  New Relieved by:  Nothing Worsened by:  Nothing Ineffective treatments:  None tried      Past Medical History:  Diagnosis Date  . Anemia   . Anxiety    off meds with preg  . Carpal tunnel syndrome   . Chlamydia   . Genital HSV    Last OB: > 69yr ago; Valtrex 500 mg po prn outbreaks  . GERD (gastroesophageal reflux disease)   . Gestational diabetes    diet controlled  . Headache(784.0)   . IBS (irritable bowel syndrome)   . Infection    UTI  . Mood swings   . Panic attack   . Thyroid disease   . Trichomonas     Patient Active Problem List   Diagnosis Date Noted  . 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 H/O: cesarean section 09/27/2016  . GERD without esophagitis 01/20/2013  . Anxiety and depression 01/20/2013  . CARPAL TUNNEL SYNDROME 06/02/2010  . Vitamin D deficiency 04/19/2010  . Herpes genitalis in  women 04/18/2010  . Goiter, unspecified 04/18/2010    Past Surgical History:  Procedure Laterality Date  . CESAREAN SECTION N/A 05/20/2013   Procedure: CESAREAN SECTION;  Surgeon: Shelly Bombard, MD;  Location: [facility];  Service: Obstetrics;  Laterality: N/A;  . CESAREAN SECTION N/A 04/06/2017   Procedure: CESAREAN SECTION;  Surgeon: Chancy Milroy, MD;  Location: [facility];  Service: Obstetrics;  Laterality: N/A;  . INDUCED ABORTION  2009  . VAGINAL DELIVERY  2007     OB History    Gravida  9   Para  4   Term  3   Preterm  1   AB  5   Living  3     SAB  2   TAB  3   Ectopic      Multiple  0   Live Births  3           Family History  Problem Relation Age of Onset  . Depression Mother   . Anxiety disorder Mother   . Breast cancer Other   . Bipolar disorder Brother   . Diabetes Maternal Grandmother   . Cancer Maternal Grandmother   . Hypertension Maternal Grandmother   . Hypertension Paternal Grandmother   . Autism Other   . Schizophrenia Other     Social History   Tobacco Use  .  Smoking status: Passive Smoke Exposure - Never Smoker  . Smokeless tobacco: Never Used  . Tobacco comment: was not a daily smoker, tried it  Substance Use Topics  . Alcohol use: No    Comment: Twice a month  . Drug use: No    Home Medications Prior to Admission medications   Medication Sig Start Date End Date Taking? Authorizing Provider  cyclobenzaprine (FLEXERIL) 5 MG tablet Take 2 tablets (10 mg total) by mouth at bedtime as needed for muscle spasms. Patient taking differently: Take 10 mg by mouth daily as needed for muscle spasms.  02/23/20   Huston Foley, MD  famotidine (PEPCID) 20 MG tablet Take 1 tablet (20 mg total) by mouth 2 (two) times daily. 03/11/20   Raeford Razor, MD  LORazepam (ATIVAN) 1 MG tablet Take 1 tablet (1 mg total) by mouth 3 (three) times daily as needed for anxiety. Patient taking differently: Take 1 mg by mouth daily as needed for  anxiety.  02/29/20   Mancel Bale, MD  SUMAtriptan (IMITREX) 50 MG tablet Take 1 tablet (50 mg total) by mouth every 2 (two) hours as needed for migraine. May repeat in 2 hours if headache persists or recurs. 02/16/20   Gailen Shelter, PA  valACYclovir (VALTREX) 1000 MG tablet Take 500 mg by mouth as needed (cold sores).  12/29/19   [provider]  fluticasone (FLONASE) 50 MCG/ACT nasal spray Place 1 spray into both nostrils daily. Use for 2-3 weeks to help with the nasal congestion and ear pain Patient not taking: Reported on 01/21/2020 01/20/20 02/11/20  Gwyneth Sprout, MD  sertraline (ZOLOFT) 25 MG tablet Take one tablet daily. If tolerating, increase to two tablets in one week. Patient not taking: Reported on 01/21/2020 01/18/20 02/11/20  Arvilla Market, DO  zolpidem (AMBIEN) 5 MG tablet Take 1 tablet (5 mg total) by mouth at bedtime as needed for sleep. Patient not taking: Reported on 01/21/2020 01/20/20 02/11/20  Gwyneth Sprout, MD    Allergies    Zofran Frazier Richards hcl] and Latex  Review of Systems   Review of Systems  Cardiovascular: Positive for chest pain.  All other systems reviewed and are negative.   Physical Exam Updated Vital Signs BP (!) 144/99 (BP Location: Right Arm)   Pulse 91   Temp 98.6 F (37 C)   Resp 16   Ht 5\' 8"  (1.727 m)   Wt 110 kg   LMP 02/07/2020   SpO2 98%   BMI 36.87 kg/m   Physical Exam Vitals and nursing note reviewed.  Constitutional:      General: She is not in acute distress.    Appearance: She is well-developed. She is not diaphoretic.  HENT:     Head: Normocephalic and atraumatic.  Cardiovascular:     Rate and Rhythm: Normal rate and regular rhythm.     Heart sounds: No murmur heard.  No friction rub. No gallop.   Pulmonary:     Effort: Pulmonary effort is normal. No respiratory distress.     Breath sounds: Normal breath sounds. No wheezing.  Abdominal:     General: Bowel sounds are normal. There is no  distension.     Palpations: Abdomen is soft.     Tenderness: There is no abdominal tenderness.  Musculoskeletal:        General: Normal range of motion.     Cervical back: Normal range of motion and neck supple.     Right lower leg: No tenderness. No edema.  Left lower leg: No tenderness. No edema.  Skin:    General: Skin is warm and dry.  Neurological:     Mental Status: She is alert and oriented to person, place, and time.     ED Results / Procedures / Treatments   Labs (all labs ordered are listed, but only abnormal results are displayed) Labs Reviewed  BASIC METABOLIC PANEL - Abnormal; Notable for the following components:      Result Value   Potassium 3.2 (*)    Glucose, Bld 112 (*)    BUN <5 (*)    All other components within normal limits  CBC - Abnormal; Notable for the following components:   WBC 11.8 (*)    Hemoglobin 11.2 (*)    MCV 75.8 (*)    MCH 23.2 (*)    RDW 17.2 (*)    All other components within normal limits  I-STAT BETA HCG BLOOD, ED ([facility], [facility], [facility] ONLY) - Abnormal; Notable for the following components:   I-stat hCG, quantitative 1,439.1 (*)    All other components within normal limits  PROTIME-INR  TROPONIN I (HIGH SENSITIVITY)  TROPONIN I (HIGH SENSITIVITY)    EKG EKG Interpretation  Date/Time:  Saturday March 12 2020 22:57:09 EDT Ventricular Rate:  98 PR Interval:  138 QRS Duration: 78 QT Interval:  352 QTC Calculation: 449 R Axis:   46 Text Interpretation: Normal sinus rhythm Nonspecific T wave abnormality Abnormal ECG No significant change since 03/11/2020 Confirmed by Geoffery Lyons ([id number]) on 03/13/2020 5:05:39 AM   Radiology DG Chest 2 View  Result Date: 03/12/2020 CLINICAL DATA:  Chest pain EXAM: CHEST - 2 VIEW COMPARISON:  02/10/2020 FINDINGS: The heart size and mediastinal contours are within normal limits. Both lungs are clear. The visualized skeletal structures are unremarkable. IMPRESSION: Normal study. Electronically Signed   By:  Charlett Nose M.D.   On: 03/12/2020 23:51   US OB LESS THAN 14 WEEKS WITH OB TRANSVAGINAL  Result Date: 03/11/2020 CLINICAL DATA:  Abdominal pain since yesterday in first trimester of pregnancy, quantitative beta HCG 780; LMP not provided EXAM: OBSTETRIC <14 WK [place] AND TRANSVAGINAL OB US TECHNIQUE: Both transabdominal and transvaginal ultrasound examinations were performed for complete evaluation of the gestation as well as the maternal uterus, adnexal regions, and pelvic cul-de-sac. Transvaginal technique was performed to assess early pregnancy. COMPARISON:  None for this gestation FINDINGS: Intrauterine gestational sac: Absent Yolk sac:  N/A Embryo:  N/A Cardiac Activity: N/A Heart Rate: N/A  bpm MSD:   mm    w     d CRL:    mm    w    d                  [place] EDC: Subchorionic hemorrhage:  N/A Maternal uterus/adnexae: Uterus retroverted, normal in appearance. No uterine mass or gestational sac. Endometrial complex appears prominent at 20 mm thick without fluid or gestational sac. RIGHT ovary normal size and morphology, 2.6 x 3.3 x 2.8 cm. LEFT ovary normal size and morphology, 2.2 x 3.7 x 3.1 cm. No free pelvic fluid or adnexal masses. IMPRESSION: No intrauterine gestation identified. Findings are consistent with pregnancy of unknown location. Differential diagnosis includes early intrauterine pregnancy too early to visualize, spontaneous abortion, and ectopic pregnancy. Serial quantitative beta HCG and or follow-up ultrasound recommended to definitively exclude ectopic pregnancy. Electronically Signed   By: Ulyses Southward M.D.   On: 03/11/2020 10:41    Procedures Procedures (including critical care time)  Medications Ordered in ED Medications  sodium chloride flush (NS) 0.9 % injection 3 mL (has no administration in time range)    ED Course  I have reviewed the triage vital signs and the nursing notes.  Pertinent labs & imaging results that were available during my care of the patient were reviewed by  me and considered in my medical decision making (see chart for details).    MDM Rules/Calculators/A&P  Patient presents here with complaints of chest discomfort as described in the HPI.  Her EKG is unchanged and troponin is negative.  Breath sounds are clear and her oxygen saturations are 100% with respiratory rate of 16.  She has no swelling of the legs and no calf tenderness.  I highly doubt any acute process such as acute coronary syndrome or pulmonary embolism.  Her symptoms are most likely anxiety related or possibly musculoskeletal.  I have no concern that her symptoms are related to her pregnancy.  Patient's beta hCG was 782 days ago and today is 1500.  She is not experiencing any abdominal pain or bleeding.  Today's complaints are in her chest.  I feel as though emergent pathology has been ruled out and that the patient is appropriate for discharge.  I feel as though any additional workup or medications are possibly harmful to her unborn child and extremely unlikely to reveal any significant pathology.  Due to her early pregnancy, I have advised her that she can take Tylenol as needed for pain.  Of note is that this is this patient's 28th visit to the ER in the past 3 months.  Final Clinical Impression(s) / ED Diagnoses Final diagnoses:  None    Rx / DC Orders ED Discharge Orders    None       Geoffery Lyons, MD 03/13/20 [id number]    Geoffery Lyons, MD 03/13/20 [phone]

## 2020-03-13 NOTE — Discharge Instructions (Addendum)
Take Tylenol 1000 mg every 6 hours as needed for pain.  Follow-up with your primary doctor and OB in the next few days.

## 2020-03-13 NOTE — ED Notes (Signed)
No answer x1 for vitals recheck 

## 2020-03-21 ENCOUNTER — Encounter: Payer: Self-pay | Admitting: Internal Medicine

## 2020-03-21 ENCOUNTER — Telehealth (INDEPENDENT_AMBULATORY_CARE_PROVIDER_SITE_OTHER): Payer: Medicaid Other | Admitting: Internal Medicine

## 2020-03-21 DIAGNOSIS — R110 Nausea: Secondary | ICD-10-CM | POA: Diagnosis not present

## 2020-03-21 DIAGNOSIS — O3680X0 Pregnancy with inconclusive fetal viability, not applicable or unspecified: Secondary | ICD-10-CM | POA: Diagnosis not present

## 2020-03-21 DIAGNOSIS — R079 Chest pain, unspecified: Secondary | ICD-10-CM | POA: Diagnosis not present

## 2020-03-21 NOTE — Progress Notes (Signed)
Virtual Visit via Telephone Note  I connected with Tammy Sours, on 03/21/2020 at 10:24 AM by telephone due to the COVID-19 pandemic and verified that I am speaking with the correct person using two identifiers.   Consent: I discussed the limitations, risks, security and privacy concerns of performing an evaluation and management service by telephone and the availability of in person appointments. I also discussed with the patient that there may be a patient responsible charge related to this service. The patient expressed understanding and agreed to proceed.   Location of Patient: Home   Location of Provider: Clinic    Persons participating in Telemedicine visit: Afsheen Antony [facility] Dr. Juleen China    History of Present Illness: Patient has a visit for multiple concerns. She endorses chest pain, nausea, dizziness, generalized weakness. This has been occurring for a week. She is currently pregnant, she is unsure how many weeks of pregnancy. She denies actual vomiting. Reports that her appetite is diminished and she has not been able to tolerate much reporting all she has been able to keep down today was a small orange and a handful of grapes.   Her heart feels so bad she feels like she is having "mini heart attacks".   Unable to tell me if she having abdominal pain at all.    Past Medical History:  Diagnosis Date  . Anemia   . Anxiety    off meds with preg  . Carpal tunnel syndrome   . Chlamydia   . 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 GERD (gastroesophageal reflux disease)   . Gestational diabetes    diet controlled  . Headache(784.0)   . IBS (irritable bowel syndrome)   . Infection    UTI  . Mood swings   . Panic attack   . Thyroid disease   . Trichomonas    Allergies  Allergen Reactions  . Zofran [Ondansetron Hcl] Nausea And Vomiting  . Latex Itching    Current Outpatient Medications on File Prior to Visit  Medication  Sig Dispense Refill  . [DISCONTINUED] fluticasone (FLONASE) 50 MCG/ACT nasal spray Place 1 spray into both nostrils daily. Use for 2-3 weeks to help with the nasal congestion and ear pain (Patient not taking: Reported on 01/21/2020) 9.9 mL 2  . [DISCONTINUED] sertraline (ZOLOFT) 25 MG tablet Take one tablet daily. If tolerating, increase to two tablets in one week. (Patient not taking: Reported on 01/21/2020) 60 tablet 1  . [DISCONTINUED] zolpidem (AMBIEN) 5 MG tablet Take 1 tablet (5 mg total) by mouth at bedtime as needed for sleep. (Patient not taking: Reported on 01/21/2020) 4 tablet 0   No current facility-administered medications on file prior to visit.    Observations/Objective: NAD. Speaking clearly.  Work of breathing normal.  Alert and oriented. Mood appropriate.   Assessment and Plan: 1. Pregnancy of unknown anatomic location I have reviewed that patient had bhcg of 780 on 6/11 at ED visit. At that time, ultrasound was performed with no intrauterine gestation identified with finding consistent with pregnancy of unknown location. The following day in the ED, her bhcg did increase by >35% with result of 1439. This makes ectopic pregnancy less likely but can not be excluded. Discussed with patient that she should have had follow up bchg testing and will need repeat ultrasound. Given that she is having a variety of vague symptoms, I think it is best that she be evaluated  in person to ensure she is hemodynamically stable and to have this work up completed. I have recommended that she go to MAU.   2. Nausea without vomiting This may be related to early pregnancy symptoms. IUP has yet to be concerned. Given how poorly she reports feeling, I am worried that she may be dehydrated due to symptoms as well.   3. Chest pain, unspecified type She continues to endorse severe chest pain. This could be related to anxiety, somatization of symptoms, referred pain from elsewhere, heartburn, etc.  Reassuringly, she has had recent unremarkable cardiac and pulmonary work up in the [facility]. Given this, chronicity of symptoms, and age lower suspicion for life threatening process.   Notable that patient has had many ED visits within the past few months for a variety of complaints.    Follow Up Instructions: Directed patient to go to MAU    I discussed the assessment and treatment plan with the patient. The patient was provided an opportunity to ask questions and all were answered. The patient agreed with the plan and demonstrated an understanding of the instructions.   The patient was advised to call back or seek an in-person evaluation if the symptoms worsen or if the condition fails to improve as anticipated.      I provided 28 minutes total of non-face-to-face time during this encounter including median intraservice time, reviewing previous notes, investigations, ordering medications, medical decision making, coordinating care and patient verbalized understanding at the end of the visit.    Marcy Siren, D.O. Primary Care at [facility]  03/21/2020, 10:24 AM

## 2020-03-22 ENCOUNTER — Encounter (HOSPITAL_COMMUNITY): Payer: Self-pay | Admitting: Obstetrics and Gynecology

## 2020-03-22 ENCOUNTER — Inpatient Hospital Stay (HOSPITAL_COMMUNITY): Payer: Medicaid Other

## 2020-03-22 ENCOUNTER — Other Ambulatory Visit: Payer: Self-pay

## 2020-03-22 ENCOUNTER — Inpatient Hospital Stay (HOSPITAL_COMMUNITY)
Admission: AD | Admit: 2020-03-22 | Discharge: 2020-03-22 | Disposition: A | Discharge status: Home / Self Care | Payer: Medicaid Other | Attending: Obstetrics and Gynecology | Admitting: Obstetrics and Gynecology

## 2020-03-22 DIAGNOSIS — Z3A01 Less than 8 weeks gestation of pregnancy: Secondary | ICD-10-CM

## 2020-03-22 DIAGNOSIS — Z888 Allergy status to other drugs, medicaments and biological substances status: Secondary | ICD-10-CM | POA: Diagnosis not present

## 2020-03-22 DIAGNOSIS — R42 Dizziness and giddiness: Secondary | ICD-10-CM | POA: Diagnosis not present

## 2020-03-22 DIAGNOSIS — E079 Disorder of thyroid, unspecified: Secondary | ICD-10-CM | POA: Diagnosis not present

## 2020-03-22 DIAGNOSIS — O99281 Endocrine, nutritional and metabolic diseases complicating pregnancy, first trimester: Secondary | ICD-10-CM | POA: Insufficient documentation

## 2020-03-22 DIAGNOSIS — R079 Chest pain, unspecified: Secondary | ICD-10-CM | POA: Insufficient documentation

## 2020-03-22 DIAGNOSIS — Z87440 Personal history of urinary (tract) infections: Secondary | ICD-10-CM | POA: Insufficient documentation

## 2020-03-22 DIAGNOSIS — K219 Gastro-esophageal reflux disease without esophagitis: Secondary | ICD-10-CM | POA: Insufficient documentation

## 2020-03-22 DIAGNOSIS — K589 Irritable bowel syndrome without diarrhea: Secondary | ICD-10-CM | POA: Insufficient documentation

## 2020-03-22 DIAGNOSIS — Z3490 Encounter for supervision of normal pregnancy, unspecified, unspecified trimester: Secondary | ICD-10-CM

## 2020-03-22 DIAGNOSIS — O99611 Diseases of the digestive system complicating pregnancy, first trimester: Secondary | ICD-10-CM | POA: Diagnosis not present

## 2020-03-22 DIAGNOSIS — O26891 Other specified pregnancy related conditions, first trimester: Secondary | ICD-10-CM | POA: Insufficient documentation

## 2020-03-22 DIAGNOSIS — Z98891 History of uterine scar from previous surgery: Secondary | ICD-10-CM | POA: Diagnosis not present

## 2020-03-22 DIAGNOSIS — O99891 Other specified diseases and conditions complicating pregnancy: Secondary | ICD-10-CM | POA: Diagnosis not present

## 2020-03-22 DIAGNOSIS — O3680X0 Pregnancy with inconclusive fetal viability, not applicable or unspecified: Secondary | ICD-10-CM

## 2020-03-22 HISTORY — DX: Encounter for supervision of normal pregnancy, unspecified, unspecified trimester: Z34.90

## 2020-03-22 LAB — COMPREHENSIVE METABOLIC PANEL
ALT: 11 U/L (ref 0–44)
AST: 10 U/L — ABNORMAL LOW (ref 15–41)
Albumin: 3.3 g/dL — ABNORMAL LOW (ref 3.5–5.0)
Alkaline Phosphatase: 53 U/L (ref 38–126)
Anion gap: 9 (ref 5–15)
BUN: <5 mg/dL — ABNORMAL LOW (ref 6–20)
CO2: 22 mmol/L (ref 22–32)
Calcium: 9.2 mg/dL (ref 8.9–10.3)
Chloride: 105 mmol/L (ref 98–111)
Creatinine, Ser: 0.58 mg/dL (ref 0.44–1.00)
GFR calc Af Amer: >60 mL/min (ref >60)
GFR calc non Af Amer: >60 mL/min (ref >60)
Glucose, Bld: 131 mg/dL — ABNORMAL HIGH (ref 70–99)
Potassium: 4.4 mmol/L (ref 3.5–5.1)
Sodium: 136 mmol/L (ref 135–145)
Total Bilirubin: 0.5 mg/dL (ref 0.3–1.2)
Total Protein: 7.4 g/dL (ref 6.5–8.1)

## 2020-03-22 LAB — CBC
HCT: 33.9 % — ABNORMAL LOW (ref 36.0–46.0)
Hemoglobin: 10.6 g/dL — ABNORMAL LOW (ref 12.0–15.0)
MCH: 23.5 pg — ABNORMAL LOW (ref 26.0–34.0)
MCHC: 31.3 g/dL (ref 30.0–36.0)
MCV: 75 fL — ABNORMAL LOW (ref 80.0–100.0)
Platelets: 329 10*3/uL (ref 150–400)
RBC: 4.52 MIL/uL (ref 3.87–5.11)
RDW: 17.3 % — ABNORMAL HIGH (ref 11.5–15.5)
WBC: 8.1 10*3/uL (ref 4.0–10.5)
nRBC: 0 % (ref 0.0–0.2)

## 2020-03-22 LAB — RAPID URINE DRUG SCREEN, HOSP PERFORMED
Amphetamines: NOT DETECTED
Barbiturates: NOT DETECTED
Benzodiazepines: NOT DETECTED
Cocaine: NOT DETECTED
Opiates: NOT DETECTED
Tetrahydrocannabinol: NOT DETECTED

## 2020-03-22 LAB — URINALYSIS, ROUTINE W REFLEX MICROSCOPIC
Bilirubin Urine: NEGATIVE
Glucose, UA: NEGATIVE mg/dL
Hgb urine dipstick: NEGATIVE
Ketones, ur: NEGATIVE mg/dL
Leukocytes,Ua: NEGATIVE
Nitrite: NEGATIVE
Protein, ur: NEGATIVE mg/dL
Specific Gravity, Urine: 1.008 (ref 1.005–1.030)
pH: 7 (ref 5.0–8.0)

## 2020-03-22 LAB — HCG, QUANTITATIVE, PREGNANCY: hCG, Beta Chain, Quant, S: 18532 m[IU]/mL — ABNORMAL HIGH (ref <5)

## 2020-03-22 NOTE — MAU Provider Note (Signed)
History     CSN: [id number]  Arrival date and time: 03/22/20 0741  First Provider Initiated Contact with Patient 03/22/20 0820     Chief Complaint  Patient presents with  . Chest Pain  . Dizziness   HPI Melinda Jimenez is a 34 y.o. [id number] with pregnancy of unknown location who presents to MAU for evaluation of multiple recurrent complaints including chest pain and dizziness. She first noticed these complaints about one week ago but believes they have worsened in the past three days.   Her pain is located behind her sternum and does not radiate. She denies aggravating or alleviating factors. She denies activity intolerance, weakness or syncope. She reports that she is able to perform her ADLs independently.  She endorses pregnancy of unknown location and states her PCP advised her to present to MAU for evaluation of IUP.  Patient denies all ob-related complaints including abdominal pain, vaginal bleeding, dysuria, fever or recent illness.  Patient's support person states she has only eaten a small sandwich and a few sips of water today. She endorses nausea but denies vomiting.  OB History    Gravida  10   Para  4   Term  3   Preterm  1   AB  5   Living  3     SAB  2   TAB  3   Ectopic      Multiple  0   Live Births  3           Past Medical History:  Diagnosis Date  . Anemia   . Anxiety    off meds with preg  . Carpal tunnel syndrome   . Chlamydia   . Genital HSV    Last OB: > 27yr ago; Valtrex 500 mg po prn outbreaks  . GERD (gastroesophageal reflux disease)   . Gestational diabetes    diet controlled  . Headache(784.0)   . IBS (irritable bowel syndrome)   . Infection    UTI  . Mood swings   . Panic attack   . Thyroid disease   . Trichomonas     Past Surgical History:  Procedure Laterality Date  . CESAREAN SECTION N/A 05/20/2013   Procedure: CESAREAN SECTION;  Surgeon: Brock Bad, MD;  Location: [facility] OR[facility];  Service: Obstetrics;   Laterality: N/A;  . CESAREAN SECTION N/A 04/06/2017   Procedure: CESAREAN SECTION;  Surgeon: Hermina Staggers, MD;  Location: [facility] BIRTHING SUITES;  Service: Obstetrics;  Laterality: N/A;  . INDUCED ABORTION  2009  . VAGINAL DELIVERY  2007    Family History  Problem Relation Age of Onset  . Depression Mother   . Anxiety disorder Mother   . Breast cancer Other   . Bipolar disorder Brother   . Diabetes Maternal Grandmother   . Cancer Maternal Grandmother   . Hypertension Maternal Grandmother   . Hypertension Paternal Grandmother   . Autism Other   . Schizophrenia Other     Social History   Tobacco Use  . Smoking status: Passive Smoke Exposure - Never Smoker  . Smokeless tobacco: Never Used  . Tobacco comment: was not a daily smoker, tried it  Substance Use Topics  . Alcohol use: No    Comment: Twice a month  . Drug use: No    Allergies:  Allergies  Allergen Reactions  . Zofran [Ondansetron Hcl] Nausea And Vomiting  . Latex Itching    No medications prior to admission.    Review of  Systems  Constitutional: Positive for chills and fatigue.  Respiratory: Negative for chest tightness, shortness of breath and wheezing.   Cardiovascular: Positive for chest pain.  Gastrointestinal: Negative for abdominal pain.  Genitourinary: Negative for vaginal bleeding.  Musculoskeletal: Negative for back pain.  Neurological: Positive for dizziness. Negative for syncope, weakness and headaches.  All other systems reviewed and are negative.  Physical Exam   Blood pressure 121/88, pulse 85, temperature 98.7 F (37.1 C), temperature source Oral, resp. rate 18, height 5\' 8"  (1.727 m), weight 98.6 kg, SpO2 99 %.  Physical Exam  Nursing note and vitals reviewed. HENT:  Mouth/Throat: Mucous membranes are moist.  Cardiovascular: Normal rate, regular rhythm, normal heart sounds and normal pulses.  Respiratory: Effort normal and breath sounds normal.  GI: Normal appearance. There is no  abdominal tenderness. There is no rebound.  Genitourinary:    Genitourinary Comments: Not evaluated due to patient denial of symptoms   Neurological: She is alert.  Skin: Capillary refill takes less than 2 seconds.    MAU Course/MDM  Procedures  --No IUP visualized  Imaging from 03/11/2020. Expected rise in quant hCG previously verified  --S/p Cardiac workup for chest pain in [facility] 03/12/2020.   --Patient upset during initial assessment with FOB present. Verbalizes to him that she "already has the weight of the world on my shoulders".  --Patient c/o being cold, shivering but not utilizing bedding provided or accepting offer of warm blankets  --Patient verbalizing to CNM that she has trusted the wrong people, has lost her relationship with her pastor who previously routinely checked up on her via phone calls. Patient repeatedly verbalizes that she "has trusted the wrong people" then states she "doesn't feel comfortable taking about it".  --Patient denies IPV, SI and HI.  --EMR reviewed. Almost 30 visits to ED in recent months. Discussed with patient that many notes indicate complaints not mentioned to PCP. Advised to reconsider fundamental aspects of care including adequate PO nutrition and hydration, communicating needs to PCP, taking medication as prescribed including multivitamin and Fe supplement.   --Reassurance provided regarding normal EKG and labs today. Discussed chest pain as possibly associated with anxiety and depression, occasional manifestation of acid reflux especially in pregnancy.  Patient denies. Declines discussion of outpatient management of mood, food tolerance, nutrition referral.  Patient Vitals for the past 24 hrs:  BP Temp Temp src Pulse Resp SpO2 Height Weight  03/22/20 1036 125/82 98.1 F (36.7 C) Oral 82 18 100 % -- --  03/22/20 0803 121/88 98.7 F (37.1 C) Oral 85 18 99 % -- --  03/22/20 0758 -- -- -- -- -- -- 5\' 8"  (1.727 m) 98.6 kg   Results for orders  placed or performed during the hospital encounter of 03/22/20 (from the past 24 hour(s))  Rapid urine drug screen (hospital performed)     Status: None   Collection Time: 03/22/20  8:11 AM  Result Value Ref Range   Opiates NONE DETECTED NONE DETECTED   Cocaine NONE DETECTED NONE DETECTED   Benzodiazepines NONE DETECTED NONE DETECTED   Amphetamines NONE DETECTED NONE DETECTED   Tetrahydrocannabinol NONE DETECTED NONE DETECTED   Barbiturates NONE DETECTED NONE DETECTED  Urinalysis, Routine w reflex microscopic     Status: None   Collection Time: 03/22/20  8:18 AM  Result Value Ref Range   Color, Urine YELLOW YELLOW   APPearance CLEAR CLEAR   Specific Gravity, Urine 1.008 1.005 - 1.030   pH 7.0 5.0 - 8.0  Glucose, UA NEGATIVE NEGATIVE mg/dL   Hgb urine dipstick NEGATIVE NEGATIVE   Bilirubin Urine NEGATIVE NEGATIVE   Ketones, ur NEGATIVE NEGATIVE mg/dL   Protein, ur NEGATIVE NEGATIVE mg/dL   Nitrite NEGATIVE NEGATIVE   Leukocytes,Ua NEGATIVE NEGATIVE  CBC     Status: Abnormal   Collection Time: 03/22/20  8:43 AM  Result Value Ref Range   WBC 8.1 4.0 - 10.5 K/uL   RBC 4.52 3.87 - 5.11 MIL/uL   Hemoglobin 10.6 (L) 12.0 - 15.0 g/dL   HCT 33.9 (L) 36 - 46 %   MCV 75.0 (L) 80.0 - 100.0 fL   MCH 23.5 (L) 26.0 - 34.0 pg   MCHC 31.3 30.0 - 36.0 g/dL   RDW 17.3 (H) 11.5 - 15.5 %   Platelets 329 150 - 400 K/uL   nRBC 0.0 0.0 - 0.2 %  Comprehensive metabolic panel     Status: Abnormal   Collection Time: 03/22/20  8:43 AM  Result Value Ref Range   Sodium 136 135 - 145 mmol/L   Potassium 4.4 3.5 - 5.1 mmol/L   Chloride 105 98 - 111 mmol/L   CO2 22 22 - 32 mmol/L   Glucose, Bld 131 (H) 70 - 99 mg/dL   BUN <5 (L) 6 - 20 mg/dL   Creatinine, Ser 0.58 0.44 - 1.00 mg/dL   Calcium 9.2 8.9 - 10.3 mg/dL   Total Protein 7.4 6.5 - 8.1 g/dL   Albumin 3.3 (L) 3.5 - 5.0 g/dL   AST 10 (L) 15 - 41 U/L   ALT 11 0 - 44 U/L   Alkaline Phosphatase 53 38 - 126 U/L   Total Bilirubin 0.5 0.3 - 1.2  mg/dL   GFR calc non Af Amer >60 >60 mL/min   GFR calc Af Amer >60 >60 mL/min   Anion gap 9 5 - 15  hCG, quantitative, pregnancy     Status: Abnormal   Collection Time: 03/22/20  8:43 AM  Result Value Ref Range   hCG, Beta Chain, Quant, S 18,532 (H) <5 mIU/mL   US OB Transvaginal  Result Date: 03/22/2020 CLINICAL DATA:  Question of viability in a patient with unknown last menstrual period and prior ultrasound of 03/11/2020 quantitative beta HCG and process previously 1,400 EXAM: OBSTETRIC <14 WK [place] AND TRANSVAGINAL OB US TECHNIQUE: Both transabdominal and transvaginal ultrasound examinations were performed for complete evaluation of the gestation as well as the maternal uterus, adnexal regions, and pelvic cul-de-sac. Transvaginal technique was performed to assess early pregnancy. COMPARISON:  03/11/2020 FINDINGS: Intrauterine gestational sac: Single Yolk sac:  Visualized Embryo:  Visualized Cardiac Activity: Visualized Heart Rate: 116 bpm CRL: 4.9 mm   6 w   1 d                  [place] EDC: 11/14/2020 Subchorionic hemorrhage:  None visualized. Maternal uterus/adnexae: RIGHT ovary 3.4 x 2.9 x 2.5 cm. LEFT ovary normal at 3.3 x 2.0 x 1.7 cm. IMPRESSION: 1. Single intrauterine gestation with gestational age of [redacted] weeks 1 day by crown-rump length and heart rate of 116 beats per minute. Electronically Signed   By: Zetta Bills M.D.   On: 03/22/2020 10:10   Assessment and Plan  --34 y.o. [id number] at with [facility] at [redacted]w[redacted]d  --Recurrent chest pain --S/p normal EKG --Discharge home in stable condition  F/U: --Advised to consider [facility] Clinician available at [facility] --Advised to follow up with PCP in one week for stress management  --  Encouraged symptom journal to help track multiple fluctuating concerns --Return to MAU for pregnancy-related complaints  Calvert Cantor, CNM 03/22/2020, 11:56 AM

## 2020-03-22 NOTE — MAU Note (Signed)
Presents with c/o chest pain & dizziness x1 week, has worsened in past 3 days.  Reports chest pain is located midsternum and over to the left side.  States has taken Tylenol & a muscle relaxer and hasn't had much relief, last taken last night.

## 2020-03-22 NOTE — Discharge Instructions (Signed)

## 2020-03-23 ENCOUNTER — Other Ambulatory Visit: Payer: Medicaid Other

## 2020-03-29 ENCOUNTER — Emergency Department (HOSPITAL_COMMUNITY): Payer: Medicaid Other

## 2020-03-29 ENCOUNTER — Encounter (HOSPITAL_COMMUNITY): Payer: Self-pay

## 2020-03-29 ENCOUNTER — Emergency Department (HOSPITAL_COMMUNITY)
Admission: EM | Admit: 2020-03-29 | Discharge: 2020-03-29 | Disposition: A | Discharge status: Home / Self Care | Payer: Medicaid Other | Attending: Emergency Medicine | Admitting: Emergency Medicine

## 2020-03-29 ENCOUNTER — Other Ambulatory Visit: Payer: Self-pay

## 2020-03-29 DIAGNOSIS — O99411 Diseases of the circulatory system complicating pregnancy, first trimester: Secondary | ICD-10-CM | POA: Diagnosis not present

## 2020-03-29 DIAGNOSIS — O26891 Other specified pregnancy related conditions, first trimester: Secondary | ICD-10-CM | POA: Diagnosis present

## 2020-03-29 DIAGNOSIS — Z91040 Latex allergy status: Secondary | ICD-10-CM | POA: Insufficient documentation

## 2020-03-29 DIAGNOSIS — Z3A01 Less than 8 weeks gestation of pregnancy: Secondary | ICD-10-CM | POA: Insufficient documentation

## 2020-03-29 DIAGNOSIS — Z7722 Contact with and (suspected) exposure to environmental tobacco smoke (acute) (chronic): Secondary | ICD-10-CM | POA: Diagnosis not present

## 2020-03-29 DIAGNOSIS — Z3491 Encounter for supervision of normal pregnancy, unspecified, first trimester: Secondary | ICD-10-CM

## 2020-03-29 DIAGNOSIS — O24419 Gestational diabetes mellitus in pregnancy, unspecified control: Secondary | ICD-10-CM | POA: Insufficient documentation

## 2020-03-29 DIAGNOSIS — R079 Chest pain, unspecified: Secondary | ICD-10-CM

## 2020-03-29 DIAGNOSIS — Z7984 Long term (current) use of oral hypoglycemic drugs: Secondary | ICD-10-CM | POA: Diagnosis not present

## 2020-03-29 LAB — CBC
HCT: 36.6 % (ref 36.0–46.0)
Hemoglobin: 11.2 g/dL — ABNORMAL LOW (ref 12.0–15.0)
MCH: 23.3 pg — ABNORMAL LOW (ref 26.0–34.0)
MCHC: 30.6 g/dL (ref 30.0–36.0)
MCV: 76.1 fL — ABNORMAL LOW (ref 80.0–100.0)
Platelets: 372 10*3/uL (ref 150–400)
RBC: 4.81 MIL/uL (ref 3.87–5.11)
RDW: 17 % — ABNORMAL HIGH (ref 11.5–15.5)
WBC: 11.3 10*3/uL — ABNORMAL HIGH (ref 4.0–10.5)
nRBC: 0 % (ref 0.0–0.2)

## 2020-03-29 LAB — I-STAT BETA HCG BLOOD, ED (MC, WL, AP ONLY): I-stat hCG, quantitative: >2000 m[IU]/mL — ABNORMAL HIGH (ref <5)

## 2020-03-29 LAB — BASIC METABOLIC PANEL
Anion gap: 13 (ref 5–15)
BUN: 6 mg/dL (ref 6–20)
CO2: 22 mmol/L (ref 22–32)
Calcium: 9.3 mg/dL (ref 8.9–10.3)
Chloride: 100 mmol/L (ref 98–111)
Creatinine, Ser: 0.51 mg/dL (ref 0.44–1.00)
GFR calc Af Amer: >60 mL/min (ref >60)
GFR calc non Af Amer: >60 mL/min (ref >60)
Glucose, Bld: 117 mg/dL — ABNORMAL HIGH (ref 70–99)
Potassium: 3.8 mmol/L (ref 3.5–5.1)
Sodium: 135 mmol/L (ref 135–145)

## 2020-03-29 LAB — HEPATIC FUNCTION PANEL
ALT: 13 U/L (ref 0–44)
AST: 10 U/L — ABNORMAL LOW (ref 15–41)
Albumin: 3.9 g/dL (ref 3.5–5.0)
Alkaline Phosphatase: 59 U/L (ref 38–126)
Bilirubin, Direct: <0.1 mg/dL (ref 0.0–0.2)
Total Bilirubin: 0.6 mg/dL (ref 0.3–1.2)
Total Protein: 8.5 g/dL — ABNORMAL HIGH (ref 6.5–8.1)

## 2020-03-29 LAB — TROPONIN I (HIGH SENSITIVITY): Troponin I (High Sensitivity): 13 ng/L (ref <18)

## 2020-03-29 LAB — HCG, QUANTITATIVE, PREGNANCY: hCG, Beta Chain, Quant, S: 64226 m[IU]/mL — ABNORMAL HIGH (ref <5)

## 2020-03-29 MED ORDER — ALUM & MAG HYDROXIDE-SIMETH 200-200-20 MG/5ML PO SUSP
30.0000 mL | Freq: Once | ORAL | Status: AC
  Administered 2020-03-29: 30 mL via ORAL
  Filled 2020-03-29: qty 30

## 2020-03-29 MED ORDER — SODIUM CHLORIDE 0.9% FLUSH
3.0000 mL | Freq: Once | INTRAVENOUS | Status: AC
  Administered 2020-03-29: 3 mL via INTRAVENOUS

## 2020-03-29 MED ORDER — SODIUM CHLORIDE (PF) 0.9 % IJ SOLN
INTRAMUSCULAR | Status: AC
  Filled 2020-03-29: qty 50

## 2020-03-29 MED ORDER — IOHEXOL 350 MG/ML SOLN: 100.0000 mL | Freq: Once | INTRAVENOUS | Status: DC | PRN

## 2020-03-29 NOTE — ED Notes (Signed)
Refused blood work.

## 2020-03-29 NOTE — ED Notes (Signed)
An After Visit Summary was printed and given to the patient. Discharge instructions given and no further questions at this time.  

## 2020-03-29 NOTE — ED Notes (Signed)
Patient refused for this writer to draw blood. Patient stated they want there mom to be present.

## 2020-03-29 NOTE — ED Notes (Signed)
Pt refused to have another IV inserted for CT Angio after pt removed original IV. Pt refusing to have any more blood work done. Dr. Charm Barges aware.

## 2020-03-29 NOTE — ED Provider Notes (Signed)
[facility]-EMERGENCY DEPT Provider Note   CSN: [id number] Arrival date & time: 03/29/20  4098     History Chief Complaint  Patient presents with  . Chest Pain  . Back Pain    Melinda Jimenez is a 34 y.o. female.  She was recently diagnosed as being pregnant.  Approximately [redacted] weeks along.  She is complaining of subxiphoid chest pain moderate intensity that radiates through to her back.  Caused her difficulty to get to sleep.  Seems positional at times.  Minimal cough nonproductive.  No fevers chills.  Some shortness of breath.  No nausea vomiting.  States is urinating a lot.  No vaginal bleeding or discharge.  The history is provided by the patient.  Chest Pain Pain location:  Substernal area Pain quality: aching   Pain radiates to:  Mid back Pain severity:  Moderate Onset quality:  Gradual Duration:  18 hours Timing:  Constant Progression:  Unchanged Chronicity:  New Context: at rest   Relieved by:  None tried Worsened by:  Certain positions Ineffective treatments:  None tried Associated symptoms: back pain, cough and shortness of breath   Associated symptoms: no abdominal pain, no fever, no headache, no heartburn and no vomiting   Risk factors: pregnancy   Back Pain Associated symptoms: chest pain   Associated symptoms: no abdominal pain, no dysuria, no fever and no headaches        Past Medical History:  Diagnosis Date  . Anemia   . Anxiety    off meds with preg  . Carpal tunnel syndrome   . Chlamydia   . Genital HSV    Last OB: > 83yr ago; Valtrex 500 mg po prn outbreaks  . GERD (gastroesophageal reflux disease)   . Gestational diabetes    diet controlled  . Headache(784.0)   . IBS (irritable bowel syndrome)   . Infection    UTI  . Mood swings   . Panic attack   . Thyroid disease   . Trichomonas     Patient Active Problem List   Diagnosis Date Noted  . Intrauterine pregnancy 03/22/2020  . 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 H/O: cesarean section 09/27/2016  . GERD without esophagitis 01/20/2013  . Anxiety and depression 01/20/2013  . CARPAL TUNNEL SYNDROME 06/02/2010  . Vitamin D deficiency 04/19/2010  . Herpes genitalis in women 04/18/2010  . Goiter, unspecified 04/18/2010    Past Surgical History:  Procedure Laterality Date  . CESAREAN SECTION N/A 05/20/2013   Procedure: CESAREAN SECTION;  Surgeon: Brock Bad, MD;  Location: [facility];  Service: Obstetrics;  Laterality: N/A;  . CESAREAN SECTION N/A 04/06/2017   Procedure: CESAREAN SECTION;  Surgeon: Hermina Staggers, MD;  Location: [facility] BIRTHING SUITES;  Service: Obstetrics;  Laterality: N/A;  . INDUCED ABORTION  2009  . VAGINAL DELIVERY  2007     OB History    Gravida  10   Para  4   Term  3   Preterm  1   AB  5   Living  3     SAB  2   TAB  3   Ectopic      Multiple  0   Live Births  3           Family History  Problem Relation Age of Onset  . Depression Mother   . Anxiety disorder Mother   . Breast cancer Other   . Bipolar  disorder Brother   . Diabetes Maternal Grandmother   . Cancer Maternal Grandmother   . Hypertension Maternal Grandmother   . Hypertension Paternal Grandmother   . Autism Other   . Schizophrenia Other     Social History   Tobacco Use  . Smoking status: Passive Smoke Exposure - Never Smoker  . Smokeless tobacco: Never Used  . Tobacco comment: was not a daily smoker, tried it  Substance Use Topics  . Alcohol use: No    Comment: Twice a month  . Drug use: No    Home Medications Prior to Admission medications   Medication Sig Start Date End Date Taking? Authorizing Provider  fluticasone (FLONASE) 50 MCG/ACT nasal spray Place 1 spray into both nostrils daily. Use for 2-3 weeks to help with the nasal congestion and ear pain Patient not taking: Reported on 01/21/2020 01/20/20 02/11/20  Gwyneth SproutPlunkett, Whitney, MD  sertraline (ZOLOFT) 25 MG tablet Take one tablet  daily. If tolerating, increase to two tablets in one week. Patient not taking: Reported on 01/21/2020 01/18/20 02/11/20  Arvilla MarketWallace, Catherine Lauren, DO  zolpidem (AMBIEN) 5 MG tablet Take 1 tablet (5 mg total) by mouth at bedtime as needed for sleep. Patient not taking: Reported on 01/21/2020 01/20/20 02/11/20  Gwyneth SproutPlunkett, Whitney, MD    Allergies    Zofran Frazier Richards[ondansetron hcl] and Latex  Review of Systems   Review of Systems  Constitutional: Negative for fever.  HENT: Negative for sore throat.   Eyes: Negative for visual disturbance.  Respiratory: Positive for cough and shortness of breath.   Cardiovascular: Positive for chest pain.  Gastrointestinal: Negative for abdominal pain, heartburn and vomiting.  Genitourinary: Negative for dysuria.  Musculoskeletal: Positive for back pain.  Skin: Negative for rash.  Neurological: Negative for headaches.    Physical Exam Updated Vital Signs BP 134/79 (BP Location: Left Arm)   Pulse 95   Temp 99 F (37.2 C) (Oral)   Resp 20   Ht 5\' 8"  (1.727 m)   LMP  (LMP Unknown)   SpO2 99%   BMI 33.04 kg/m   Physical Exam Vitals and nursing note reviewed.  Constitutional:      General: She is not in acute distress.    Appearance: She is well-developed.  HENT:     Head: Normocephalic and atraumatic.  Eyes:     Conjunctiva/sclera: Conjunctivae normal.  Cardiovascular:     Rate and Rhythm: Normal rate and regular rhythm.     Heart sounds: Normal heart sounds. No murmur heard.   Pulmonary:     Effort: Pulmonary effort is normal. No respiratory distress.     Breath sounds: Normal breath sounds.  Abdominal:     Palpations: Abdomen is soft.     Tenderness: There is no abdominal tenderness. There is no guarding or rebound.  Musculoskeletal:        General: Normal range of motion.     Cervical back: Neck supple.     Right lower leg: No tenderness.     Left lower leg: No tenderness.  Skin:    General: Skin is warm and dry.     Capillary Refill:  Capillary refill takes less than 2 seconds.  Neurological:     General: No focal deficit present.     Mental Status: She is alert.      ED Results / Procedures / Treatments   Labs (all labs ordered are listed, but only abnormal results are displayed) Labs Reviewed  BASIC METABOLIC PANEL - Abnormal; Notable for  the following components:      Result Value   Glucose, Bld 117 (*)    All other components within normal limits  CBC - Abnormal; Notable for the following components:   WBC 11.3 (*)    Hemoglobin 11.2 (*)    MCV 76.1 (*)    MCH 23.3 (*)    RDW 17.0 (*)    All other components within normal limits  HEPATIC FUNCTION PANEL - Abnormal; Notable for the following components:   Total Protein 8.5 (*)    AST 10 (*)    All other components within normal limits  HCG, QUANTITATIVE, PREGNANCY - Abnormal; Notable for the following components:   hCG, Beta Chain, Quant, S [id number] (*)    All other components within normal limits  I-STAT BETA HCG BLOOD, ED ([facility], [facility], [facility] ONLY) - Abnormal; Notable for the following components:   I-stat hCG, quantitative >2,000.0 (*)    All other components within normal limits  TROPONIN I (HIGH SENSITIVITY)  TROPONIN I (HIGH SENSITIVITY)    EKG EKG Interpretation  Date/Time:  Tuesday March 29 2020 09:55:12 EDT Ventricular Rate:  92 PR Interval:    QRS Duration: 85 QT Interval:  342 QTC Calculation: 423 R Axis:   29 Text Interpretation: Sinus rhythm No significant change since prior 6/21 Confirmed by Meridee Score [phone]) on 03/29/2020 10:13:09 AM   Radiology DG Chest 2 View  Result Date: 03/29/2020 CLINICAL DATA:  Chest pain and back pain and weakness. EXAM: CHEST - 2 VIEW COMPARISON:  Chest x-ray dated 03/27/2020 at [facility] FINDINGS: The heart size and mediastinal contours are within normal limits. Both lungs are clear. The visualized skeletal structures are unremarkable. IMPRESSION: Normal exam.  No change since the prior  study. Electronically Signed   By: Francene Boyers M.D.   On: 03/29/2020 10:46    Procedures Procedures (including critical care time)  Medications Ordered in ED Medications  iohexol (OMNIPAQUE) 350 MG/ML injection 100 mL (has no administration in time range)  sodium chloride (PF) 0.9 % injection (has no administration in time range)  sodium chloride flush (NS) 0.9 % injection 3 mL (3 mLs Intravenous Given 03/29/20 1016)  alum & mag hydroxide-simeth (MAALOX/MYLANTA) 200-200-20 MG/5ML suspension 30 mL (30 mLs Oral Given 03/29/20 1036)    ED Course  I have reviewed the triage vital signs and the nursing notes.  Pertinent labs & imaging results that were available during my care of the patient were reviewed by me and considered in my medical decision making (see chart for details).  Clinical Course as of Mar 29 1745  Tue Mar 29, 2020  1056 Reviewed prior notes.  She is in the emergency department fairly frequently.  Was at [facility] yesterday for chest pain that is been going on for 2 weeks.  At that point they discussed doing a CT PE study with her which she declined.  She is not tachycardic tachypneic or hypoxic here.  Pulse ox 99% on room air.   [MB]  1057 I was called back to the patient's room because she said she cannot breathe.  Sats remain well.  She wants to undergo the CAT scan.  Understands there is risk for possible fetal damage.   [MB]  1509 CT informed me that the patient is hesitant to get the CAT scan and they have asked her multiple times.  I went in and discussed the CT with the patient and her other results.  Her significant other  was also there.  He said that this has been going on for a month.  This makes me even less suspicious that it is a PE.  I still recommended that she follow-up with her OB to discuss this.  She asked if she could have some time with her significant other to discuss getting a CT.   [MB]  1533 After discussion with her significant other  patient does not want the CAT scan wants her IV taken out would like to be discharged.  Tried to reinforce that she needs to follow-up with her OB.   [MB]    Clinical Course User Index [MB] Terrilee Files, MD   MDM Rules/Calculators/A&P                         This patient complains of chest pain and shortness of breath, initially told me it started last evening but looks like it may be going on for a month; this involves an extensive number of treatment Options and is a complaint that carries with it a high risk of complications and Morbidity. The differential includes reflux, musculoskeletal, ACS, pneumonia, pneumothorax, PE, anxiety  I ordered, reviewed and interpreted labs, which included CBC with mildly elevated white blood cell count, stable hemoglobin, chemistries normal other than mild elevation of glucose, normal LFTs, beta hCG consistent with dates I ordered medication Mylanta with minimal change in her symptoms I ordered imaging studies which included chest x-ray and I independently    visualized and interpreted imaging which showed no infiltrates no pneumothorax Additional history obtained from patient significant other Previous records obtained and reviewed in epic, 89+ ED visits this year for various complaints.  Specifically she had been at [facility] for her shortness of breath chest pain complaint just 2 days ago.  Had delta troponin there.  Also has seen OB and they did not feel her chest pain was worrisome.  After the interventions stated above, I reevaluated the patient and found patient still symptomatic.  We had multiple discussions with getting a chest CT to evaluate for pulmonary embolism versus following up with her treating physicians.  Clinically she does not appear to have significant PE with sats 100% on room air.  Ultimately she elects not to get the CAT scan and is discharged.  Return instructions given.  Final Clinical Impression(s) / ED  Diagnoses Final diagnoses:  Nonspecific chest pain  First trimester pregnancy    Rx / DC Orders ED Discharge Orders    None       Terrilee Files, MD 03/29/20 1751

## 2020-03-29 NOTE — Discharge Instructions (Addendum)
You were seen in the emergency department for chest pain and shortness of breath.  You had blood work EKG chest x-ray that did not show any serious findings.  Please contact your OB doctor for close follow-up.

## 2020-03-29 NOTE — ED Triage Notes (Addendum)
Pt presents with c/o chest pain radiating to her back. Pt reports the pain started yesterday. Pt denies any N/V. Pt also reports some shortness of breath with the pain. Pt is pregnant also, reports that she is a couple of weeks pregnant.

## 2020-03-30 ENCOUNTER — Emergency Department (HOSPITAL_COMMUNITY): Payer: Medicaid Other

## 2020-03-30 ENCOUNTER — Emergency Department (HOSPITAL_COMMUNITY)
Admission: EM | Admit: 2020-03-30 | Discharge: 2020-03-30 | Disposition: A | Discharge status: Home / Self Care | Payer: Medicaid Other | Attending: Emergency Medicine | Admitting: Emergency Medicine

## 2020-03-30 ENCOUNTER — Other Ambulatory Visit: Payer: Self-pay

## 2020-03-30 ENCOUNTER — Encounter (HOSPITAL_COMMUNITY): Payer: Self-pay

## 2020-03-30 DIAGNOSIS — M542 Cervicalgia: Secondary | ICD-10-CM | POA: Diagnosis not present

## 2020-03-30 DIAGNOSIS — R0602 Shortness of breath: Secondary | ICD-10-CM | POA: Diagnosis present

## 2020-03-30 DIAGNOSIS — Z7722 Contact with and (suspected) exposure to environmental tobacco smoke (acute) (chronic): Secondary | ICD-10-CM | POA: Diagnosis not present

## 2020-03-30 NOTE — ED Provider Notes (Signed)
[facility]-EMERGENCY DEPT Provider Note   CSN: [id number] Arrival date & time: 03/30/20  1603     History Chief Complaint  Patient presents with  . Shortness of Breath    Melinda Jimenez is a 34 y.o. female.  Pt feels like her throat is closing up.   Pt reports she is not short of breath but she feels like her neck is swelling.    The history is provided by the patient. No language interpreter was used.  Sore Throat This is a new problem. The current episode started 12 to 24 hours ago. The problem occurs constantly. Nothing aggravates the symptoms. Nothing relieves the symptoms. She has tried nothing for the symptoms. The treatment provided moderate relief.       Past Medical History:  Diagnosis Date  . Anemia   . Anxiety    off meds with preg  . Carpal tunnel syndrome   . Chlamydia   . Genital HSV    Last OB: > 8yr ago; Valtrex 500 mg po prn outbreaks  . GERD (gastroesophageal reflux disease)   . Gestational diabetes    diet controlled  . Headache(784.0)   . IBS (irritable bowel syndrome)   . Infection    UTI  . Mood swings   . Panic attack   . Thyroid disease   . Trichomonas     Patient Active Problem List   Diagnosis Date Noted  . Intrauterine pregnancy 03/22/2020  . 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 H/O: cesarean section 09/27/2016  . GERD without esophagitis 01/20/2013  . Anxiety and depression 01/20/2013  . CARPAL TUNNEL SYNDROME 06/02/2010  . Vitamin D deficiency 04/19/2010  . Herpes genitalis in women 04/18/2010  . Goiter, unspecified 04/18/2010    Past Surgical History:  Procedure Laterality Date  . CESAREAN SECTION N/A 05/20/2013   Procedure: CESAREAN SECTION;  Surgeon: Brock Bad, MD;  Location: [facility] OR[facility];  Service: Obstetrics;  Laterality: N/A;  . CESAREAN SECTION N/A 04/06/2017   Procedure: CESAREAN SECTION;  Surgeon: Hermina Staggers, MD;  Location: [facility] BIRTHING SUITES;   Service: Obstetrics;  Laterality: N/A;  . INDUCED ABORTION  2009  . VAGINAL DELIVERY  2007     OB History    Gravida  10   Para  4   Term  3   Preterm  1   AB  5   Living  3     SAB  2   TAB  3   Ectopic      Multiple  0   Live Births  3           Family History  Problem Relation Age of Onset  . Depression Mother   . Anxiety disorder Mother   . Breast cancer Other   . Bipolar disorder Brother   . Diabetes Maternal Grandmother   . Cancer Maternal Grandmother   . Hypertension Maternal Grandmother   . Hypertension Paternal Grandmother   . Autism Other   . Schizophrenia Other     Social History   Tobacco Use  . Smoking status: Passive Smoke Exposure - Never Smoker  . Smokeless tobacco: Never Used  . Tobacco comment: was not a daily smoker, tried it  Substance Use Topics  . Alcohol use: No    Comment: Twice a month  . Drug use: No    Home Medications Prior to Admission medications   Medication Sig Start Date End Date Taking?  Authorizing Provider  fluticasone (FLONASE) 50 MCG/ACT nasal spray Place 1 spray into both nostrils daily. Use for 2-3 weeks to help with the nasal congestion and ear pain Patient not taking: Reported on 01/21/2020 01/20/20 02/11/20  Gwyneth Sprout, MD  sertraline (ZOLOFT) 25 MG tablet Take one tablet daily. If tolerating, increase to two tablets in one week. Patient not taking: Reported on 01/21/2020 01/18/20 02/11/20  Arvilla Market, DO  zolpidem (AMBIEN) 5 MG tablet Take 1 tablet (5 mg total) by mouth at bedtime as needed for sleep. Patient not taking: Reported on 01/21/2020 01/20/20 02/11/20  Gwyneth Sprout, MD    Allergies    Zofran Frazier Richards hcl] and Latex  Review of Systems   Review of Systems  All other systems reviewed and are negative.   Physical Exam Updated Vital Signs BP 126/89 (BP Location: Left Arm)   Pulse 96   Temp 98.1 F (36.7 C) (Oral)   Resp (!) 26   LMP  (LMP Unknown)   SpO2 99%    Physical Exam Vitals and nursing note reviewed.  Constitutional:      Appearance: She is well-developed.  HENT:     Head: Normocephalic.  Neck:     Thyroid: No thyromegaly.     Vascular: No JVD.     Trachea: No tracheal deviation.  Cardiovascular:     Rate and Rhythm: Normal rate and regular rhythm.  Pulmonary:     Effort: Pulmonary effort is normal.     Breath sounds: Normal breath sounds.  Abdominal:     General: There is no distension.  Musculoskeletal:        General: Normal range of motion.     Cervical back: Normal range of motion and neck supple.  Lymphadenopathy:     Cervical: No cervical adenopathy.  Skin:    General: Skin is warm.  Neurological:     Mental Status: She is alert and oriented to person, place, and time.  Psychiatric:        Mood and Affect: Mood normal.     ED Results / Procedures / Treatments   Labs (all labs ordered are listed, but only abnormal results are displayed) Labs Reviewed - No data to display  EKG None  Radiology DG Chest 2 View  Result Date: 03/29/2020 CLINICAL DATA:  Chest pain and back pain and weakness. EXAM: CHEST - 2 VIEW COMPARISON:  Chest x-ray dated 03/27/2020 at [facility] FINDINGS: The heart size and mediastinal contours are within normal limits. Both lungs are clear. The visualized skeletal structures are unremarkable. IMPRESSION: Normal exam.  No change since the prior study. Electronically Signed   By: Francene Boyers M.D.   On: 03/29/2020 10:46    Procedures Procedures (including critical care time)  Medications Ordered in ED Medications - No data to display  ED Course  I have reviewed the triage vital signs and the nursing notes.  Pertinent labs & imaging results that were available during my care of the patient were reviewed by me and considered in my medical decision making (see chart for details).    MDM Rules/Calculators/A&P                         MDM:  Pt looks good 02 sats are  100%.  Soft tissue neck is normal.  Pt seen yesterday for shortness of breath.  Pt  refused Ct.  I think pt symptoms are second to anxiety.  I doubt  pe, no angioedema or sign of allergic reaction.  Pt advised to schedule prenatal care   Final Clinical Impression(s) / ED Diagnoses Final diagnoses:  Neck discomfort    Rx / DC Orders ED Discharge Orders    None    An After Visit Summary was printed and given to the patient.    Elson Areas, [place] 03/30/20 1750    Bethann Berkshire, MD 03/30/20 2211

## 2020-03-30 NOTE — Discharge Instructions (Addendum)
Follow up with OB for prenatal care.

## 2020-03-30 NOTE — ED Notes (Signed)
Pt asking if her children were still in the lobby. Pt told she could go look.

## 2020-03-30 NOTE — ED Triage Notes (Signed)
Pt arrives c/o [facility] and feels like her throat is tightening. Pt able to speak in full sentences.

## 2020-03-31 ENCOUNTER — Inpatient Hospital Stay (HOSPITAL_COMMUNITY)
Admission: AD | Admit: 2020-03-31 | Discharge: 2020-04-01 | Discharge status: Against Medical Advice | Payer: Medicaid Other | Source: Home / Self Care | Admitting: Obstetrics and Gynecology

## 2020-03-31 ENCOUNTER — Emergency Department (HOSPITAL_COMMUNITY)
Admission: EM | Admit: 2020-03-31 | Discharge: 2020-03-31 | Disposition: A | Discharge status: Home / Self Care | Payer: Medicaid Other | Attending: Emergency Medicine | Admitting: Emergency Medicine

## 2020-03-31 ENCOUNTER — Encounter (HOSPITAL_COMMUNITY): Payer: Self-pay | Admitting: Obstetrics and Gynecology

## 2020-03-31 ENCOUNTER — Encounter (HOSPITAL_COMMUNITY): Payer: Self-pay | Admitting: Emergency Medicine

## 2020-03-31 DIAGNOSIS — Z5321 Procedure and treatment not carried out due to patient leaving prior to being seen by health care provider: Secondary | ICD-10-CM | POA: Insufficient documentation

## 2020-03-31 DIAGNOSIS — O99891 Other specified diseases and conditions complicating pregnancy: Secondary | ICD-10-CM | POA: Diagnosis not present

## 2020-03-31 DIAGNOSIS — R0602 Shortness of breath: Secondary | ICD-10-CM | POA: Diagnosis not present

## 2020-03-31 DIAGNOSIS — F23 Brief psychotic disorder: Secondary | ICD-10-CM | POA: Diagnosis not present

## 2020-03-31 LAB — CBC
HCT: 34.1 % — ABNORMAL LOW (ref 36.0–46.0)
Hemoglobin: 10.6 g/dL — ABNORMAL LOW (ref 12.0–15.0)
MCH: 23.1 pg — ABNORMAL LOW (ref 26.0–34.0)
MCHC: 31.1 g/dL (ref 30.0–36.0)
MCV: 74.3 fL — ABNORMAL LOW (ref 80.0–100.0)
Platelets: 411 10*3/uL — ABNORMAL HIGH (ref 150–400)
RBC: 4.59 MIL/uL (ref 3.87–5.11)
RDW: 16.7 % — ABNORMAL HIGH (ref 11.5–15.5)
WBC: 13 10*3/uL — ABNORMAL HIGH (ref 4.0–10.5)
nRBC: 0 % (ref 0.0–0.2)

## 2020-03-31 LAB — BASIC METABOLIC PANEL
Anion gap: 12 (ref 5–15)
BUN: <5 mg/dL — ABNORMAL LOW (ref 6–20)
CO2: 20 mmol/L — ABNORMAL LOW (ref 22–32)
Calcium: 9.6 mg/dL (ref 8.9–10.3)
Chloride: 102 mmol/L (ref 98–111)
Creatinine, Ser: 0.6 mg/dL (ref 0.44–1.00)
GFR calc Af Amer: >60 mL/min (ref >60)
GFR calc non Af Amer: >60 mL/min (ref >60)
Glucose, Bld: 125 mg/dL — ABNORMAL HIGH (ref 70–99)
Potassium: 3 mmol/L — ABNORMAL LOW (ref 3.5–5.1)
Sodium: 134 mmol/L — ABNORMAL LOW (ref 135–145)

## 2020-03-31 LAB — TROPONIN I (HIGH SENSITIVITY): Troponin I (High Sensitivity): 10 ng/L (ref <18)

## 2020-03-31 LAB — I-STAT BETA HCG BLOOD, ED (MC, WL, AP ONLY): I-stat hCG, quantitative: >2000 m[IU]/mL — ABNORMAL HIGH (ref <5)

## 2020-03-31 MED ORDER — SODIUM CHLORIDE 0.9% FLUSH: 3.0000 mL | Freq: Once | INTRAVENOUS | Status: DC

## 2020-03-31 NOTE — ED Notes (Signed)
Pt now c/o abd pain while waiting in [facility]

## 2020-03-31 NOTE — ED Notes (Addendum)
Pt complaining of increased shortness of breath, updated. Pt continuously moving around on wheelchair, Husband with patient for safety.

## 2020-03-31 NOTE — MAU Note (Signed)
SOB for a wk and throat swelling for a wk. Spitting up blood. Heart rate goes up and down suddenly. Having abd pain in lower abd that is cramping. Saw blood on tissue tonight when wiped

## 2020-03-31 NOTE — ED Triage Notes (Signed)
Pt transported from home by [facility] for shob, pt not following directions for EMS, refusing to speak at times. VSS

## 2020-03-31 NOTE — MAU Note (Addendum)
Once pt to rm pt states she cannot breathe. Oxygen saturation 99 and Wynelle Bourgeois CNM at bedside. Pt had moved to end of bed. Helped to move back up in bed and HOB up. Pt then answered some questions and then mumbled. States indicates cannot talk due to tongue swelling. Artelia Laroche CNM states best to send pt back to ER for eval of tongue swelling. Pt then asked if we can transport her back to ED and CNM told her we can. PT answers questions appropriately but only with grunts and head knods. PT then got out of bed and ambulated to BR and caught u/a without difficulty.

## 2020-03-31 NOTE — ED Notes (Signed)
Pt will not follow commands. Will not sit still for a good EKG. Will need repeat when able to do so. MD and RN made aware

## 2020-03-31 NOTE — ED Notes (Signed)
Pt making continual motion on WC, pt not following command but is very alert. Upon re entry to room pt using phone to facetime friend. Pt will yell "my chest" when alone but will not speak during interview. Pt pointing to neck and tongue, no angioedema noted. No stridor heard.

## 2020-04-01 ENCOUNTER — Emergency Department (EMERGENCY_DEPARTMENT_HOSPITAL)
Admission: EM | Admit: 2020-04-01 | Discharge: 2020-04-01 | Discharge status: Against Medical Advice | Payer: Medicaid Other | Source: Home / Self Care

## 2020-04-01 ENCOUNTER — Encounter: Payer: Self-pay | Admitting: Advanced Practice Midwife

## 2020-04-01 ENCOUNTER — Emergency Department (HOSPITAL_COMMUNITY)
Admission: EM | Admit: 2020-04-01 | Discharge: 2020-04-01 | Disposition: A | Discharge status: Home / Self Care | Payer: Medicaid Other | Attending: Emergency Medicine | Admitting: Emergency Medicine

## 2020-04-01 ENCOUNTER — Other Ambulatory Visit: Payer: Self-pay

## 2020-04-01 DIAGNOSIS — O99341 Other mental disorders complicating pregnancy, first trimester: Secondary | ICD-10-CM | POA: Diagnosis present

## 2020-04-01 DIAGNOSIS — E876 Hypokalemia: Secondary | ICD-10-CM | POA: Diagnosis not present

## 2020-04-01 DIAGNOSIS — F23 Brief psychotic disorder: Secondary | ICD-10-CM

## 2020-04-01 DIAGNOSIS — R0789 Other chest pain: Secondary | ICD-10-CM | POA: Insufficient documentation

## 2020-04-01 DIAGNOSIS — O99891 Other specified diseases and conditions complicating pregnancy: Secondary | ICD-10-CM | POA: Diagnosis not present

## 2020-04-01 DIAGNOSIS — F419 Anxiety disorder, unspecified: Secondary | ICD-10-CM | POA: Insufficient documentation

## 2020-04-01 DIAGNOSIS — Z79899 Other long term (current) drug therapy: Secondary | ICD-10-CM | POA: Diagnosis not present

## 2020-04-01 DIAGNOSIS — Z3A01 Less than 8 weeks gestation of pregnancy: Secondary | ICD-10-CM | POA: Insufficient documentation

## 2020-04-01 DIAGNOSIS — Z91040 Latex allergy status: Secondary | ICD-10-CM | POA: Insufficient documentation

## 2020-04-01 LAB — URINALYSIS, ROUTINE W REFLEX MICROSCOPIC
Bacteria, UA: NONE SEEN
Bilirubin Urine: NEGATIVE
Glucose, UA: NEGATIVE mg/dL
Hgb urine dipstick: NEGATIVE
Ketones, ur: 80 mg/dL — AB
Leukocytes,Ua: NEGATIVE
Nitrite: NEGATIVE
Protein, ur: 100 mg/dL — AB
Specific Gravity, Urine: 1.01 (ref 1.005–1.030)
pH: 8 (ref 5.0–8.0)

## 2020-04-01 MED ORDER — LIDOCAINE 4 % EX CREA
1.0000 | TOPICAL_CREAM | Freq: Three times a day (TID) | CUTANEOUS | 0 refills | Status: DC | PRN
Start: 2020-04-01 — End: 2020-07-06

## 2020-04-01 MED ORDER — LIDOCAINE 5 % EX PTCH
2.0000 | MEDICATED_PATCH | CUTANEOUS | Status: DC
  Administered 2020-04-01: 2 via TRANSDERMAL
  Filled 2020-04-01 (×2): qty 2

## 2020-04-01 MED ORDER — ACETAMINOPHEN 500 MG PO TABS: 500.0000 mg | ORAL_TABLET | Freq: Four times a day (QID) | ORAL | 0 refills | Status: DC | PRN

## 2020-04-01 MED ORDER — POTASSIUM CHLORIDE CRYS ER 20 MEQ PO TBCR
40.0000 meq | EXTENDED_RELEASE_TABLET | Freq: Once | ORAL | Status: AC
  Administered 2020-04-01: 40 meq via ORAL
  Filled 2020-04-01: qty 2

## 2020-04-01 MED ORDER — LIDOCAINE 4 % EX CREA
1.0000 | TOPICAL_CREAM | Freq: Three times a day (TID) | CUTANEOUS | 0 refills | Status: DC | PRN
Start: 2020-04-01 — End: 2020-04-01

## 2020-04-01 NOTE — Discharge Instructions (Addendum)
Your work-up today is reassuring.  There does not appear to be any evidence of heart attack on your work-up.  Your potassium was a little bit low but we brought this back up with the pills that we gave you.  Follow-up with cardiology for reevaluation.  I have placed a referral so they will likely be calling you to schedule follow-up but I have also given you their phone number so that you may call.  Follow-up with either cardiology or your PCP for reevaluation of your symptoms is very important.  You have had multiple emergency department evaluations that have been reassuring.  This does not mean that there is not something wrong but luckily we have not been able to identify any life-threatening causes of your symptoms.  Outpatient primary care and cardiology resources are available to you that could better explain your symptoms.   I do suspect there is some component of musculoskeletal pain to your symptoms.  Please take extra strength Tylenol every 6 hours as needed for pain.Apply lidocaine cream or other topical patches such as Salonpas, icy hot, or lidocaine patches to areas of pain including the chest wall and back.  You can also try ice packs or heating pads to the areas of pain, whichever feels best.  I typically recommend 20 minutes at a time a few times daily.  Return to the emergency department if any concerning signs or symptoms develop.

## 2020-04-01 NOTE — MAU Provider Note (Signed)
Chief Complaint: Abdominal Pain, Oral Swelling, Vaginal Bleeding, and Shortness of Breath   First Provider Initiated Contact with Patient 04/01/20 0006        SUBJECTIVE HPI: Melinda Jimenez is a 34 y.o. [id number] at [redacted]w[redacted]d by LMP who presents to maternity admissions reporting shortness of breath, throat swollen up, and lower abdominal pain.  Was seen here on 6/22 and had an early pregnancy workup which was normal   US showed 6 wk live pregnancy.  She has had over 30 ED visits for SOB/throat swelling.  She was in our main ED tonight but would not cooperate with EKG and other exams.  They did draw blood and troponin was neg.  They tried to do a chest CT on prior visit but pt refused.  She did have a normal CXR and neck xray this week that were normal    Moving about in room, won't stay in bed.  Has tongue protruding and holds her neck.  Would not keep pulse ox on but when we did get it on it showed 99-100% saturation.  States she cannot breathe, but will sometimes calm down on command. Will sometimes speak with words when asked to.  Mostly mumbles and points.   HPI  Past Medical History:  Diagnosis Date  . Anemia   . Anxiety    off meds with preg  . Carpal tunnel syndrome   . Chlamydia   . Genital HSV    Last OB: > 57yr ago; Valtrex 500 mg po prn outbreaks  . GERD (gastroesophageal reflux disease)   . Gestational diabetes    diet controlled  . Headache(784.0)   . IBS (irritable bowel syndrome)   . Infection    UTI  . Mood swings   . Panic attack   . Thyroid disease   . Trichomonas    Past Surgical History:  Procedure Laterality Date  . CESAREAN SECTION N/A 05/20/2013   Procedure: CESAREAN SECTION;  Surgeon: Brock Bad, MD;  Location: [facility];  Service: Obstetrics;  Laterality: N/A;  . CESAREAN SECTION N/A 04/06/2017   Procedure: CESAREAN SECTION;  Surgeon: Hermina Staggers, MD;  Location: [facility] BIRTHING SUITES;  Service: Obstetrics;  Laterality: N/A;  . INDUCED ABORTION  2009  . VAGINAL  DELIVERY  2007   Social History   Socioeconomic History  . Marital status: Single    Spouse name: Not on file  . Number of children: 1  . Years of education: Not on file  . Highest education level: Not on file  Occupational History  . Occupation: Unemployed  Tobacco Use  . Smoking status: Passive Smoke Exposure - Never Smoker  . Smokeless tobacco: Never Used  . Tobacco comment: was not a daily smoker, tried it  Substance and Sexual Activity  . Alcohol use: No    Comment: Twice a month  . Drug use: No  . Sexual activity: Yes    Birth control/protection: None    Comment: Patient is interested in implant  Other Topics Concern  . Not on file  Social History Narrative  . Not on file   Social Determinants of Health   Financial Resource Strain:   . Difficulty of Paying Living Expenses:   Food Insecurity:   . Worried About Programme researcher, broadcasting/film/video in the Last Year:   . Barista in the Last Year:   Transportation Needs:   . Freight forwarder (Medical):   Marland Kitchen Lack of Transportation (Non-Medical):   Physical  Activity:   . Days of Exercise per Week:   . Minutes of Exercise per Session:   Stress:   . Feeling of Stress :   Social Connections:   . Frequency of Communication with Friends and Family:   . Frequency of Social Gatherings with Friends and Family:   . Attends Religious Services:   . Active Member of Clubs or Organizations:   . Attends BankerClub or Organization Meetings:   Marland Kitchen. Marital Status:   Intimate Partner Violence:   . Fear of Current or Ex-Partner:   . Emotionally Abused:   Marland Kitchen. Physically Abused:   . Sexually Abused:    No current facility-administered medications on file prior to encounter.   Current Outpatient Medications on File Prior to Encounter  Medication Sig Dispense Refill  . [DISCONTINUED] fluticasone (FLONASE) 50 MCG/ACT nasal spray Place 1 spray into both nostrils daily. Use for 2-3 weeks to help with the nasal congestion and ear pain (Patient not  taking: Reported on 01/21/2020) 9.9 mL 2  . [DISCONTINUED] sertraline (ZOLOFT) 25 MG tablet Take one tablet daily. If tolerating, increase to two tablets in one week. (Patient not taking: Reported on 01/21/2020) 60 tablet 1  . [DISCONTINUED] zolpidem (AMBIEN) 5 MG tablet Take 1 tablet (5 mg total) by mouth at bedtime as needed for sleep. (Patient not taking: Reported on 01/21/2020) 4 tablet 0   Allergies  Allergen Reactions  . Zofran [Ondansetron Hcl] Nausea And Vomiting  . Latex Itching    I have reviewed patient's Past Medical Hx, Surgical Hx, Family Hx, Social Hx, medications and allergies.   ROS:  Review of Systems Review of Systems  Other systems negative   Physical Exam  Physical Exam Patient Vitals for the past 24 hrs:  SpO2 Height  03/31/20 2330 100 % 5\' 8"  (1.727 m)   Constitutional: Well-developed, well-nourished female in no acute distress, but agitated and mostly uncooperative. Mostly noncommunicative with voice though will speak when asked to.  Tongue protrudes, pt holding her neck constantly. .  Cardiovascular: Mild tachycardia low 100s Respiratory: normal effort, CTAB GI: Abd soft, non-tender.  MS: Extremities nontender, no edema, normal ROM PELVIC EXAM: Deferred due to being uncooperative and previous US showing viable pregnancy. No vaginal bleeding observed.  I feel that her pelvic pain is of lower priority at this time.    LAB RESULTS Results for orders placed or performed during the hospital encounter of 03/31/20 (from the past 24 hour(s))  Basic metabolic panel     Status: Abnormal   Collection Time: 03/31/20  9:37 PM  Result Value Ref Range   Sodium 134 (L) 135 - 145 mmol/L   Potassium 3.0 (L) 3.5 - 5.1 mmol/L   Chloride 102 98 - 111 mmol/L   CO2 20 (L) 22 - 32 mmol/L   Glucose, Bld 125 (H) 70 - 99 mg/dL   BUN <5 (L) 6 - 20 mg/dL   Creatinine, Ser 1.610.60 0.44 - 1.00 mg/dL   Calcium 9.6 8.9 - 09.610.3 mg/dL   GFR calc non Af Amer >60 >60 mL/min   GFR calc Af  Amer >60 >60 mL/min   Anion gap 12 5 - 15  CBC     Status: Abnormal   Collection Time: 03/31/20  9:37 PM  Result Value Ref Range   WBC 13.0 (H) 4.0 - 10.5 K/uL   RBC 4.59 3.87 - 5.11 MIL/uL   Hemoglobin 10.6 (L) 12.0 - 15.0 g/dL   HCT 04.534.1 (L) 36 - 46 %  MCV 74.3 (L) 80.0 - 100.0 fL   MCH 23.1 (L) 26.0 - 34.0 pg   MCHC 31.1 30.0 - 36.0 g/dL   RDW 35.6 (H) 86.1 - 68.3 %   Platelets 411 (H) 150 - 400 K/uL   nRBC 0.0 0.0 - 0.2 %  Troponin I (High Sensitivity)     Status: None   Collection Time: 03/31/20  9:37 PM  Result Value Ref Range   Troponin I (High Sensitivity) 10 <18 ng/L  I-Stat beta hCG blood, ED     Status: Abnormal   Collection Time: 03/31/20  9:37 PM  Result Value Ref Range   I-stat hCG, quantitative >2,000.0 (H) <5 mIU/mL   Comment 3           We did manage to get a 12-lead EKG here Machine reads as NSR with nonspecific T-wave abnormality    IMAGING DG Neck Soft Tissue  Result Date: 03/30/2020 CLINICAL DATA:  Neck tightness EXAM: NECK SOFT TISSUES - 1+ VIEW COMPARISON:  None. FINDINGS: There is no evidence of retropharyngeal soft tissue swelling or epiglottic enlargement. The cervical airway is unremarkable and no radio-opaque foreign body identified. IMPRESSION: Negative. Electronically Signed   By: Charlett Nose M.D.   On: 03/30/2020 17:29   DG Chest 2 View  Result Date: 03/29/2020 CLINICAL DATA:  Chest pain and back pain and weakness. EXAM: CHEST - 2 VIEW COMPARISON:  Chest x-ray dated 03/27/2020 at [facility] FINDINGS: The heart size and mediastinal contours are within normal limits. Both lungs are clear. The visualized skeletal structures are unremarkable. IMPRESSION: Normal exam.  No change since the prior study. Electronically Signed   By: Francene Boyers M.D.   On: 03/29/2020 10:46   DG Chest 2 View  Result Date: 03/12/2020 CLINICAL DATA:  Chest pain EXAM: CHEST - 2 VIEW COMPARISON:  02/10/2020 FINDINGS: The heart size and mediastinal  contours are within normal limits. Both lungs are clear. The visualized skeletal structures are unremarkable. IMPRESSION: Normal study. Electronically Signed   By: Charlett Nose M.D.   On: 03/12/2020 23:51   US OB Transvaginal  Result Date: 03/22/2020 CLINICAL DATA:  Question of viability in a patient with unknown last menstrual period and prior ultrasound of 03/11/2020 quantitative beta HCG and process previously 1,400 EXAM: OBSTETRIC <14 WK [place] AND TRANSVAGINAL OB US TECHNIQUE: Both transabdominal and transvaginal ultrasound examinations were performed for complete evaluation of the gestation as well as the maternal uterus, adnexal regions, and pelvic cul-de-sac. Transvaginal technique was performed to assess early pregnancy. COMPARISON:  03/11/2020 FINDINGS: Intrauterine gestational sac: Single Yolk sac:  Visualized Embryo:  Visualized Cardiac Activity: Visualized Heart Rate: 116 bpm CRL: 4.9 mm   6 w   1 d                  [place] EDC: 11/14/2020 Subchorionic hemorrhage:  None visualized. Maternal uterus/adnexae: RIGHT ovary 3.4 x 2.9 x 2.5 cm. LEFT ovary normal at 3.3 x 2.0 x 1.7 cm. IMPRESSION: 1. Single intrauterine gestation with gestational age of [redacted] weeks 1 day by crown-rump length and heart rate of 116 beats per minute. Electronically Signed   By: Donzetta Kohut M.D.   On: 03/22/2020 10:10   US OB LESS THAN 14 WEEKS WITH OB TRANSVAGINAL  Result Date: 03/11/2020 CLINICAL DATA:  Abdominal pain since yesterday in first trimester of pregnancy, quantitative beta HCG 780; LMP not provided EXAM: OBSTETRIC <14 WK [place] AND TRANSVAGINAL OB US TECHNIQUE: Both transabdominal and transvaginal  ultrasound examinations were performed for complete evaluation of the gestation as well as the maternal uterus, adnexal regions, and pelvic cul-de-sac. Transvaginal technique was performed to assess early pregnancy. COMPARISON:  None for this gestation FINDINGS: Intrauterine gestational sac: Absent Yolk sac:  N/A Embryo:  N/A  Cardiac Activity: N/A Heart Rate: N/A  bpm MSD:   mm    w     d CRL:    mm    w    d                  [place] EDC: Subchorionic hemorrhage:  N/A Maternal uterus/adnexae: Uterus retroverted, normal in appearance. No uterine mass or gestational sac. Endometrial complex appears prominent at 20 mm thick without fluid or gestational sac. RIGHT ovary normal size and morphology, 2.6 x 3.3 x 2.8 cm. LEFT ovary normal size and morphology, 2.2 x 3.7 x 3.1 cm. No free pelvic fluid or adnexal masses. IMPRESSION: No intrauterine gestation identified. Findings are consistent with pregnancy of unknown location. Differential diagnosis includes early intrauterine pregnancy too early to visualize, spontaneous abortion, and ectopic pregnancy. Serial quantitative beta HCG and or follow-up ultrasound recommended to definitively exclude ectopic pregnancy. Electronically Signed   By: Ulyses Southward M.D.   On: 03/11/2020 10:41    MAU Management/MDM: Called emergently to assess in room due to c/o neck swollen, throat closing up and shortness of breath.  See above for details.  Has had bloodwork and xrays this week.  Will transfer to ED for further workup  She had a normal OB US on 6/22 with live single intrauterine pregnancy.  No acute OB findings at this time.   ASSESSMENT Single IUP at [redacted]w[redacted]d Shortness of breath C/O throat swelling Agitation   PLAN Transfer to ED    Wynelle Bourgeois CNM, MSN Certified Nurse-Midwife 04/01/2020  12:06 AM

## 2020-04-01 NOTE — MAU Note (Signed)
PT transported to [facility] ED via w/c. Significant other with her. Report called to [place] RN CN in ED

## 2020-04-01 NOTE — ED Notes (Signed)
Pt refused to place lidocaine patches on. Pt states she would like for it to go with him home.

## 2020-04-01 NOTE — ED Notes (Signed)
Pt express understanding of d/c paperwork °

## 2020-04-01 NOTE — ED Notes (Signed)
Pt stated she was going to sit outside 

## 2020-04-01 NOTE — ED Notes (Signed)
Pt refused vitals in triage.  

## 2020-04-01 NOTE — ED Triage Notes (Signed)
EMS stated, she is complaining of anxiety, she was a back seat passenger with Melinda Jimenez and stated, she couldn't breath. Called 911, just left for the  ER with the same problem.

## 2020-04-01 NOTE — ED Notes (Signed)
Pt was found by this tech on the floor in front of the triage door.This tech asked the pt to sit in the chair, pt refused. This tech found help and pt still refused to get off the floor. Pt began scooting over the waiting room and would not get into a chair. This tech called security. Then pt finally got into the chair after the husband arrived and helped her into a wheelchair. The pt and family left.

## 2020-04-01 NOTE — ED Triage Notes (Signed)
Pt arrives to ED w/ c/o chest pain. Pt seen here earlier today and discharged for the same. Pt refusing triage. States "my chest hurt again".

## 2020-04-01 NOTE — MAU Note (Addendum)
Pt calmer and allowing NT to do EKG. PT was still and cooperative during EKG. Afterward was texting on phone. After EKG pt states she wants to go back to the ED if that would help

## 2020-04-01 NOTE — ED Provider Notes (Signed)
[facility]   CSN: [id number] Arrival date & time: 04/01/20  [id number]     History Chief Complaint  Patient presents with  . Anxiety    Melinda Jimenez is a 34 y.o. [id number] female at 3573w4d gestation, GERD, anxiety, gestational diabetes, IBS presenting for evaluation of intermittent substernal chest pressure.  States that this has been ongoing for at least 1 month.  It worsens with certain position changes and with pushing on the chest wall.  She will sometimes feel short of breath and as though her throat is tightening which has been ongoing for some time as well.  She has had 30 visits in the last 6 months to the ED in the [facility]U for similar complaints with negative work-ups.  She states "it feels like it is my heart is caving in".  Sometimes she will feel a pulling sensation in her back and her abdomen when she stands as well.  Has had some nausea and vomiting related to her pregnancy but none recently.  She has tried Tylenol with little relief.  She is a non-smoker, denies significant family history of heart disease.  Today she states that she began to cough up blood-streaked sputum.  She denies recent travel or surgeries, leg swelling, prior history of DVT or PE.  She has had a cough for some time as well.  She was seen at the [facility]U for her abdominal discomfort and was cleared from an OB standpoint.   Anxiety Associated symptoms include chest pain, abdominal pain ("pulling feeling") and shortness of breath.       Past Medical History:  Diagnosis Date  . Anemia   . Anxiety    off meds with preg  . Carpal tunnel syndrome   . Chlamydia   . Genital HSV    Last OB: > 4557yr ago; Valtrex 500 mg po prn outbreaks  . GERD (gastroesophageal reflux disease)   . Gestational diabetes    diet controlled  . Headache(784.0)   . IBS (irritable bowel syndrome)   . Infection    UTI  . Mood swings   . Panic attack   . Thyroid disease   . Trichomonas      Patient Active Problem List   Diagnosis Date Noted  . Intrauterine pregnancy 03/22/2020  . 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 H/O: cesarean section 09/27/2016  . GERD without esophagitis 01/20/2013  . Anxiety and depression 01/20/2013  . CARPAL TUNNEL SYNDROME 06/02/2010  . Vitamin D deficiency 04/19/2010  . Herpes genitalis in women 04/18/2010  . Goiter, unspecified 04/18/2010    Past Surgical History:  Procedure Laterality Date  . CESAREAN SECTION N/A 05/20/2013   Procedure: CESAREAN SECTION;  Surgeon: [name], MD;  Location: [facility];  Service: Obstetrics;  Laterality: N/A;  . CESAREAN SECTION N/A 04/06/2017   Procedure: CESAREAN SECTION;  Surgeon: Hermina StaggersErvin, Michael L, MD;  [facility];  Service: Obstetrics;  Laterality: N/A;  . INDUCED ABORTION  2009  . VAGINAL DELIVERY  2007     OB History    Gravida  10   Para  4   Term  3   Preterm  1   AB  5   Living  3     SAB  2   TAB  3   Ectopic      Multiple  0   Live Births  3  Family History  Problem Relation Age of Onset  . Depression Mother   . Anxiety disorder Mother   . Breast cancer Other   . Bipolar disorder Brother   . Diabetes Maternal Grandmother   . Cancer Maternal Grandmother   . Hypertension Maternal Grandmother   . Hypertension Paternal Grandmother   . Autism Other   . Schizophrenia Other     Social History   Tobacco Use  . Smoking status: Passive Smoke Exposure - Never Smoker  . Smokeless tobacco: Never Used  . Tobacco comment: was not a daily smoker, tried it  Substance Use Topics  . Alcohol use: No    Comment: Twice a month  . Drug use: No    Home Medications Prior to Admission medications   Medication Sig Start Date End Date Taking? Authorizing Provider  acetaminophen (TYLENOL) 500 MG tablet Take 1 tablet (500 mg total) by mouth every 6 (six) hours as needed. 04/01/20   Kemiya Batdorf A, PA-C   lidocaine (LMX) 4 % cream Apply 1 application topically 3 (three) times daily as needed. 04/01/20   Zafir Schauer A, PA-C  fluticasone (FLONASE) 50 MCG/ACT nasal spray Place 1 spray into both nostrils daily. Use for 2-3 weeks to help with the nasal congestion and ear pain Patient not taking: Reported on 01/21/2020 01/20/20 02/11/20  Gwyneth Sprout, MD  sertraline (ZOLOFT) 25 MG tablet Take one tablet daily. If tolerating, increase to two tablets in one week. Patient not taking: Reported on 01/21/2020 01/18/20 02/11/20  Arvilla Market, DO  zolpidem (AMBIEN) 5 MG tablet Take 1 tablet (5 mg total) by mouth at bedtime as needed for sleep. Patient not taking: Reported on 01/21/2020 01/20/20 02/11/20  Gwyneth Sprout, MD    Allergies    Zofran Frazier Richards hcl] and Latex  Review of Systems   Review of Systems  Constitutional: Negative for chills and fever.  Respiratory: Positive for cough, chest tightness and shortness of breath.   Cardiovascular: Positive for chest pain. Negative for leg swelling.  Gastrointestinal: Positive for abdominal pain ("pulling feeling"), nausea and vomiting.    Physical Exam Updated Vital Signs BP (!) 144/90 (BP Location: Right Arm)   Pulse 89   Temp 98.3 F (36.8 C) (Oral)   Resp 18   LMP  (LMP Unknown)   SpO2 100%   Physical Exam Vitals and nursing note reviewed.  Constitutional:      General: She is not in acute distress.    Appearance: She is well-developed.  HENT:     Head: Normocephalic and atraumatic.     Mouth/Throat:     Mouth: Mucous membranes are moist.     Comments: Tolerating secretions without difficulty.  No abnormal phonation. Eyes:     General:        Right eye: No discharge.        Left eye: No discharge.     Conjunctiva/sclera: Conjunctivae normal.  Neck:     Vascular: No JVD.     Trachea: No tracheal deviation.  Cardiovascular:     Rate and Rhythm: Normal rate and regular rhythm.     Heart sounds: Normal heart sounds.      Comments: 2+ radial and DP/PT pulses bilaterally, Homans sign absent bilaterally, no lower extremity edema, no palpable cords, compartments are soft  Pulmonary:     Effort: Pulmonary effort is normal. No respiratory distress.     Breath sounds: Normal breath sounds. No stridor.     Comments: Diffuse anterior and posterior  chest wall tenderness with no deformity, crepitus, ecchymosis or flail segment.  The patient is hesitant to allow me to push on her chest wall due to pain with palpation. Chest:     Chest wall: Tenderness present.  Abdominal:     General: Bowel sounds are normal. There is no distension.     Palpations: Abdomen is soft.     Tenderness: There is no abdominal tenderness. There is no guarding or rebound.  Musculoskeletal:     Cervical back: Normal range of motion and neck supple. No rigidity or tenderness.  Skin:    General: Skin is warm and dry.     Findings: No erythema.  Neurological:     Mental Status: She is alert.  Psychiatric:        Mood and Affect: Mood is anxious.        Speech: Speech normal.        Behavior: Behavior is withdrawn.     Comments: Makes poor eye contact.     ED Results / Procedures / Treatments   Labs (all labs ordered are listed, but only abnormal results are displayed) Labs Reviewed - No data to display  EKG None  Radiology DG Neck Soft Tissue  Result Date: 03/30/2020 CLINICAL DATA:  Neck tightness EXAM: NECK SOFT TISSUES - 1+ VIEW COMPARISON:  None. FINDINGS: There is no evidence of retropharyngeal soft tissue swelling or epiglottic enlargement. The cervical airway is unremarkable and no radio-opaque foreign body identified. IMPRESSION: Negative. Electronically Signed   By: Charlett Nose M.D.   On: 03/30/2020 17:29    Procedures Procedures (including critical care time)  Medications Ordered in ED Medications  lidocaine (LIDODERM) 5 % 2 patch (2 patches Transdermal Patch Applied 04/01/20 1110)  potassium chloride SA (KLOR-CON) CR  tablet 40 mEq (40 mEq Oral Given 04/01/20 1110)    ED Course  I have reviewed the triage vital signs and the nursing notes.  Pertinent labs & imaging results that were available during my care of the patient were reviewed by me and considered in my medical decision making (see chart for details).    MDM Rules/Calculators/A&P                          Patient returns to the ED for evaluation of chest pressure.  Has had multiple visits in the past for similar complaints including throat tightness, cough.  Today notes feeling of pulling sensation to the back and abdomen only with standing.  Her symptoms seem atypical to be cardiac in etiology.  They are not exertional.  It is reproducible on palpation to the point that she will almost not allow me to push on her chest even with light touch.  Also notes pain with position changes.  Serial troponins are negative and EKG shows no acute ischemic abnormalities.  Her vital signs are reassuring.  At one point she was tachycardic but has not been tachycardic on my assessment.  Doubt ACS/MI.  She was also seen at the [facility] and cleared from an OB standpoint.  Her abdomen is soft with no rebound or guarding.  Doubt acute surgical abdominal pathology.  Remainder of lab work reviewed and interpreted by myself shows mild leukocytosis which could be physiologic in the setting of pregnancy.  Stable anemia.  She is mildly hypokalemic, could be in the setting of emesis related to pregnancy though she reports she has not vomited today and has been tolerating p.o.  She  was given p.o. potassium in the ED which she tolerated without difficulty despite complaint of throat tightness.  No evidence of angioedema or airway obstruction.  No stridor noted on examination.  She is tolerating secretions without difficulty.  No renal insufficiency noted on lab work though she does have a little bit of ketonuria and proteinuria which could suggest dehydration.  Encouraged oral rehydration at  home.  No bacteria on UA, no concern for asymptomatic bacteriuria.  Doubt acute surgical abdominal pathology given reassuring examination and the fact that the patient is tolerating p.o. without difficulty.  She was cleared from an OB standpoint at the MAU early this morning.  Doubt dissection, cardiac tamponade, esophageal rupture, pneumonia, pneumothorax or PE.  She exhibits no evidence of respiratory distress and lungs are clear to auscultation bilaterally.  Her symptoms have been ongoing and intermittent for approximately 1 month so I have a low suspicion of serious life-threatening pathology.  She noted some hemoptysis today which could be due to epithelial sloughing in the setting of persistent cough.  I recommended Tylenol, lidocaine cream or patches, heat therapy.  She is insistent that her symptoms are cardiac in etiology and I have reassured her that we have not been able to identify a cardiac cause of her symptoms to the best of her ability in the emergency department.  I have placed a referral to cardiology to follow-up with her outpatient.  Discussed the importance of outpatient follow-up with PCP and cardiology as well as OB follow-up.  Discussed strict ED return precautions.  Patient and significant at the bedside other verbalized understanding of and agreement with plan and patient is stable for discharge at this time. Discussed with Dr. Donnald Garre who reviewed EKG and agrees with assessment and plan at this time.    Final Clinical Impression(s) / ED Diagnoses Final diagnoses:  Atypical chest pain  Acute chest wall pain  Hypokalemia    Rx / DC Orders ED Discharge Orders         Ordered    lidocaine (LMX) 4 % cream  3 times daily PRN,   Status:  Discontinued     Reprint     04/01/20 1114    acetaminophen (TYLENOL) 500 MG tablet  Every 6 hours PRN     Discontinue  Reprint     04/01/20 1114    lidocaine (LMX) 4 % cream  3 times daily PRN     Discontinue  Reprint     04/01/20 1116     Ambulatory referral to Cardiology     Discontinue  Reprint     04/01/20 1116           Bennye Alm 04/01/20 1129    Arby Barrette, MD 04/04/20 2007

## 2020-04-03 ENCOUNTER — Emergency Department (HOSPITAL_COMMUNITY): Payer: Medicaid Other

## 2020-04-03 ENCOUNTER — Emergency Department (HOSPITAL_COMMUNITY)
Admission: EM | Admit: 2020-04-03 | Discharge: 2020-04-04 | Disposition: A | Discharge status: Home / Self Care | Payer: Medicaid Other | Attending: Emergency Medicine | Admitting: Emergency Medicine

## 2020-04-03 ENCOUNTER — Other Ambulatory Visit: Payer: Self-pay

## 2020-04-03 DIAGNOSIS — F23 Brief psychotic disorder: Secondary | ICD-10-CM

## 2020-04-03 DIAGNOSIS — Z91040 Latex allergy status: Secondary | ICD-10-CM | POA: Insufficient documentation

## 2020-04-03 DIAGNOSIS — F419 Anxiety disorder, unspecified: Secondary | ICD-10-CM | POA: Insufficient documentation

## 2020-04-03 DIAGNOSIS — Z7722 Contact with and (suspected) exposure to environmental tobacco smoke (acute) (chronic): Secondary | ICD-10-CM | POA: Diagnosis not present

## 2020-04-03 DIAGNOSIS — O99341 Other mental disorders complicating pregnancy, first trimester: Secondary | ICD-10-CM | POA: Diagnosis not present

## 2020-04-03 DIAGNOSIS — Z20822 Contact with and (suspected) exposure to covid-19: Secondary | ICD-10-CM | POA: Diagnosis not present

## 2020-04-03 DIAGNOSIS — O4691 Antepartum hemorrhage, unspecified, first trimester: Secondary | ICD-10-CM | POA: Insufficient documentation

## 2020-04-03 DIAGNOSIS — Z3A01 Less than 8 weeks gestation of pregnancy: Secondary | ICD-10-CM | POA: Insufficient documentation

## 2020-04-03 DIAGNOSIS — O209 Hemorrhage in early pregnancy, unspecified: Secondary | ICD-10-CM

## 2020-04-03 HISTORY — DX: Brief psychotic disorder: F23

## 2020-04-03 LAB — COMPREHENSIVE METABOLIC PANEL
ALT: 22 U/L (ref 0–44)
AST: 16 U/L (ref 15–41)
Albumin: 4.2 g/dL (ref 3.5–5.0)
Alkaline Phosphatase: 56 U/L (ref 38–126)
Anion gap: 13 (ref 5–15)
BUN: 6 mg/dL (ref 6–20)
CO2: 21 mmol/L — ABNORMAL LOW (ref 22–32)
Calcium: 9.2 mg/dL (ref 8.9–10.3)
Chloride: 104 mmol/L (ref 98–111)
Creatinine, Ser: 0.62 mg/dL (ref 0.44–1.00)
GFR calc Af Amer: >60 mL/min (ref >60)
GFR calc non Af Amer: >60 mL/min (ref >60)
Glucose, Bld: 97 mg/dL (ref 70–99)
Potassium: 3.3 mmol/L — ABNORMAL LOW (ref 3.5–5.1)
Sodium: 138 mmol/L (ref 135–145)
Total Bilirubin: 1 mg/dL (ref 0.3–1.2)
Total Protein: 8.6 g/dL — ABNORMAL HIGH (ref 6.5–8.1)

## 2020-04-03 LAB — HCG, QUANTITATIVE, PREGNANCY: hCG, Beta Chain, Quant, S: 84642 m[IU]/mL — ABNORMAL HIGH (ref <5)

## 2020-04-03 LAB — CBC WITH DIFFERENTIAL/PLATELET
Abs Immature Granulocytes: 0.03 10*3/uL (ref 0.00–0.07)
Basophils Absolute: 0 10*3/uL (ref 0.0–0.1)
Basophils Relative: 0 %
Eosinophils Absolute: 0 10*3/uL (ref 0.0–0.5)
Eosinophils Relative: 0 %
HCT: 34.1 % — ABNORMAL LOW (ref 36.0–46.0)
Hemoglobin: 10.8 g/dL — ABNORMAL LOW (ref 12.0–15.0)
Immature Granulocytes: 0 %
Lymphocytes Relative: 22 %
Lymphs Abs: 2.2 10*3/uL (ref 0.7–4.0)
MCH: 23.8 pg — ABNORMAL LOW (ref 26.0–34.0)
MCHC: 31.7 g/dL (ref 30.0–36.0)
MCV: 75.3 fL — ABNORMAL LOW (ref 80.0–100.0)
Monocytes Absolute: 0.6 10*3/uL (ref 0.1–1.0)
Monocytes Relative: 6 %
Neutro Abs: 7.3 10*3/uL (ref 1.7–7.7)
Neutrophils Relative %: 72 %
Platelets: 352 10*3/uL (ref 150–400)
RBC: 4.53 MIL/uL (ref 3.87–5.11)
RDW: 16.6 % — ABNORMAL HIGH (ref 11.5–15.5)
WBC: 10.2 10*3/uL (ref 4.0–10.5)
nRBC: 0 % (ref 0.0–0.2)

## 2020-04-03 LAB — RAPID HIV SCREEN (HIV 1/2 AB+AG)
HIV 1/2 Antibodies: NONREACTIVE
HIV-1 P24 Antigen - HIV24: NONREACTIVE

## 2020-04-03 LAB — HIV ANTIBODY (ROUTINE TESTING W REFLEX): HIV Screen 4th Generation wRfx: NONREACTIVE

## 2020-04-03 LAB — TSH: TSH: 1.272 u[IU]/mL (ref 0.350–4.500)

## 2020-04-03 LAB — SARS CORONAVIRUS 2 BY RT PCR (HOSPITAL ORDER, PERFORMED IN ~~LOC~~ HOSPITAL LAB): SARS Coronavirus 2: NEGATIVE

## 2020-04-03 LAB — ETHANOL: Alcohol, Ethyl (B): <10 mg/dL (ref <10)

## 2020-04-03 MED ORDER — DIPHENHYDRAMINE HCL 50 MG/ML IJ SOLN
50.0000 mg | Freq: Once | INTRAMUSCULAR | Status: AC
  Administered 2020-04-03: 50 mg via INTRAMUSCULAR
  Filled 2020-04-03: qty 1

## 2020-04-03 MED ORDER — OLANZAPINE 10 MG IM SOLR
10.0000 mg | Freq: Once | INTRAMUSCULAR | Status: AC | PRN
  Administered 2020-04-03: 10 mg via INTRAMUSCULAR
  Filled 2020-04-03: qty 10

## 2020-04-03 MED ORDER — STERILE WATER FOR INJECTION IJ SOLN
INTRAMUSCULAR | Status: AC
  Filled 2020-04-03: qty 10

## 2020-04-03 NOTE — ED Notes (Signed)
Pt denied need for fluids or food at this time.

## 2020-04-03 NOTE — BH Assessment (Signed)
Comprehensive Clinical Assessment (CCA) Note  04/03/2020 ROSHAWN Jimenez [id number]    Per Melinda Means, DNP on 04/03/2020: Melinda Jimenez is a 34 y.o. female patient who presents with chief complaint of "shortness of breath" and subjective complaints of vaginal bleeding during pregnancy (7 weeks, 4 days gestation). Psychaitric team consulted by medical team, given patient's situational "anxiety", however, on assessment, patient initially refused to speak with psych team provider while groping her breast and "Facetiming" her husband. Provider returned 10 minutes later to find patient still groping breast and displaying disorganized speech and agitation, and she requests that psych team "leave the room now" as her "mother is about to come in", although patient's mother is not currently on site/in the hospital; patient agitated and experiencing hallucinations. Of note, patient has refused blood draw from nursing team and is attempting to leave unit to "find my mother".  HPI:  Patient has had multiple presentations to the ED with somatic complaints of SOB and chest pain, however, she does not have a previous history of a psychiatric disorder documented in the chart.   Past Psychiatric History: Unable to obtain psychiatric history from patient given her current psychotic state. No documented history of past psychiatric history in the EMR.   TTS:  Due to patient's current state of mind, TTS contacted patient's mother, Melinda Jimenez [phone], to obtain collateral information.  Mother states that patient has always had issues and she had behavioral problems as a child.  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  She states that patient had eight children and told her mother that she would never use any condoms.   Mother states that all the children are from different men and patient has not been able to care for them and she neglects them.  She states that patient only has three children in her custody.  She states that patient's mental health issues keep her from providing for her children and patient is unable to maintain employment.  She states that most of the time that the children are halfway dressed and she states that patient is not feeding her children appropriately.  She states that patient has been psychotic for awhile now and hearing voices.  She also states that she feels like people are practicing witchcraft/voodoo against her including a Optician, dispensing.  She states that patient feels like she is possessed and there is something in her body that she feels like is stabbing her.  Mother states that patient has a really distorted sense of reality.  She states that patient is not sleeping and not eating and she states that patient has lost forty pounds recently.     Visit Diagnosis:   F23 Brief Psychotic Episode   CCA Screening, Triage and Referral (STR)  Patient Reported Information How did you hear about [place]? Family/Friend  Referral name: Melinda Jimenez, mother [phone]  Referral phone number: No data recorded  Whom do you see for routine medical problems? No data recorded Practice/Facility Name: No data recorded Practice/Facility Phone Number: No data recorded Name of Contact: No data recorded Contact Number: No data recorded Contact Fax Number: No data recorded Prescriber Name: No data recorded Prescriber Address (if known): No data recorded  What Is the Reason for Your Visit/Call Today? No data  recorded How Long Has This Been Causing You Problems? 1-6 months  What Do You Feel Would Help You the Most Today? Other (Comment) (patient requesting inpatient hospitalization)   Have You Recently Been in Any Inpatient Treatment (Hospital/Detox/Crisis Center/28-Day Program)?  No  Name/Location of Program/Hospital:No data recorded How Long Were You There? No data recorded When Were You Discharged? No data recorded  Have You Ever Received Services From [facility] Before? Yes  Who Do You See at [facility]? has been see in the ED   Have You Recently Had Any Thoughts About Hurting Yourself? Yes (asking for a gun to end things)  Are You Planning to Commit Suicide/Harm Yourself At This time? Yes   Have you Recently Had Thoughts About Hurting Someone Melinda Jimenez? No  Explanation: No data recorded  Have You Used Any Alcohol or Drugs in the Past 24 Hours? No  How Long Ago Did You Use Drugs or Alcohol? No data recorded What Did You Use and How Much? No data recorded  Do You Currently Have a Therapist/Psychiatrist? No  Name of Therapist/Psychiatrist: No data recorded  Have You Been Recently Discharged From Any Office Practice or Programs? No  Explanation of Discharge From Practice/Program: No data recorded    CCA Screening Triage Referral Assessment Type of Contact: Tele-Assessment  Is this Initial or Reassessment? Initial Assessment  Date Telepsych consult ordered in [facility]:  No data recorded Time Telepsych consult ordered in [facility]:  No data recorded  Patient Reported Information Reviewed? No  Patient Left Without Being Seen? No  Reason for Not Completing Assessment: No data recorded  Collateral Involvement: Patient was not able to answer questions, information provided by her mother   Does Patient Have a Court Appointed Legal Guardian? No data recorded Name and Contact of Legal Guardian: Self  If Minor and Not Living with Parent(s), Who has Custody? Family member keeping children  Is CPS involved or ever been involved? Never  Is APS involved or ever been involved? Never   Patient Determined To Be At Risk for Harm To Self or Others Based on Review of Patient Reported Information or Presenting Complaint? Yes, for Self-Harm  Method: No data  recorded Availability of Jimenez: No data recorded Intent: No data recorded Notification Required: No data recorded Additional Information for Danger to Others Potential: No data recorded Additional Comments for Danger to Others Potential: No data recorded Are There Guns or Other Weapons in Your Home? No data recorded Types of Guns/Weapons: No data recorded Are These Weapons Safely Secured?                            No data recorded Who Could Verify You Are Able To Have These Secured: No data recorded Do You Have any Outstanding Charges, Pending Court Dates, Parole/Probation? No data recorded Contacted To Inform of Risk of Harm To Self or Others: Other: Comment (mother is aware of the situation)   Location of Assessment: [facility] ED   Does Patient Present under Involuntary Commitment? No  IVC Papers Initial File Date: No data recorded  [place] of Residence: [place]   Patient Currently Receiving the Following Services: Not Receiving Services   Determination of Need: No data recorded  Options For Referral: Inpatient Hospitalization     CCA Biopsychosocial  Intake/Chief Complaint:  CCA Intake With Chief Complaint CCA Part Two Date: 04/03/20 CCA Part Two Time: 1353 Chief Complaint/Presenting Problem: Patient is actively hallucinating and paranoid. Patient states that  people are performing voodoo on her. Patient has also been hearing voices. She is unable to take care of herself and her children. Patient's Currently Reported Symptoms/Problems: Patient is psychotic, hallucinating and thinks that people are practicing voodoo on her.  Her behavior has been erratic and she has been unable to care for her children. Individual's Strengths: unable to assess due to patient's psychosis Individual's Preferences: unable to assess due to patient's psychosis Individual's Abilities: unable to assess due to patient's psychosis Type of Services Patient Feels Are Needed: Patient is in need of  inpatient hospitalization  Mental Health Symptoms Depression:  Depression: Change in energy/activity, Difficulty Concentrating, Increase/decrease in appetite, Sleep (too much or little), Irritability, Duration of symptoms greater than two weeks  Mania:  Mania: Change in energy/activity, Irritability, Recklessness, Racing thoughts  Anxiety:   Anxiety: Difficulty concentrating, Irritability  Psychosis:  Psychosis: Delusions, Grossly disorganized speech, Hallucinations, Duration of symptoms greater than six months  Trauma:  Trauma: None  Obsessions:  Obsessions: Poor insight  Compulsions:  Compulsions: Poor Insight  Inattention:  Inattention: Does not follow instructions (not oppositional), Disorganized  Hyperactivity/Impulsivity:  Hyperactivity/Impulsivity: N/A  Oppositional/Defiant Behaviors:  Oppositional/Defiant Behaviors: Angry, Argumentative, Defies rules, Easily annoyed, Spiteful, Resentful, Temper  Emotional Irregularity:  Emotional Irregularity: Intense/unstable relationships, Mood lability, Potentially harmful impulsivity  Other Mood/Personality Symptoms:      Mental Status Exam Appearance and self-care  Stature:  Stature: Average  Weight:  Weight: Average weight  Clothing:  Clothing: Disheveled  Grooming:  Grooming: Neglected  Cosmetic use:  Cosmetic Use: None  Posture/gait:  Posture/Gait: Bizarre  Motor activity:  Motor Activity: Agitated, Restless  Sensorium  Attention:  Attention: Confused, Distractible  Concentration:  Concentration: Anxiety interferes, Scattered, Preoccupied  Orientation:  Orientation: Person, Place  Recall/memory:  Recall/Memory: Normal  Affect and Mood  Affect:  Affect: Anxious, Labile  Mood:  Mood: Anxious, Irritable  Relating  Eye contact:  Eye Contact: Normal  Facial expression:  Facial Expression: Anxious, Depressed  Attitude toward examiner:  Attitude Toward Examiner: Guarded  Thought and Language  Speech flow: Speech Flow: Pressured   Thought content:  Thought Content: Delusions  Preoccupation:  Preoccupations: Phobias, Obsessions (thinks peopel are "working roots" on her)  Hallucinations:  Hallucinations: Auditory  Organization:     Company secretaryxecutive Functions  Fund of Knowledge:  Fund of Knowledge: Fair  Intelligence:  Intelligence: Below average  Abstraction:  Abstraction: Development worker, international aidConcrete  Judgement:  Judgement: Dangerous, Impaired  Reality Testing:  Reality Testing: Distorted  Insight:  Insight: Lacking  Decision Making:  Decision Making: Impulsive, Confused  Social Functioning  Social Maturity:  Social Maturity: Impulsive  Social Judgement:  Social Judgement: Heedless  Stress  Stressors:  Stressors: Housing, Surveyor, quantityinancial, Relationship  Coping Ability:  Coping Ability: Deficient supports, Building surveyorverwhelmed  Skill Deficits:  Skill Deficits: Activities of daily living, Decision making, Interpersonal, Responsibility  Supports:  Supports:  (mother and brother are her only supports)     Religion: Religion/Spirituality Are You A Religious Person?:  (unable to assess) How Might This Affect Treatment?: unable to assess  Leisure/Recreation: Leisure / Recreation Do You Have Hobbies?: No  Exercise/Diet: Exercise/Diet Do You Exercise?: No Have You Gained or Lost A Significant Amount of Weight in the Past Six Months?: Yes-Lost Number of Pounds Lost?: 40 Do You Follow a Special Diet?: No Do You Have Any Trouble Sleeping?: Yes Explanation of Sleeping Difficulties: Patient has not been sleeping much at all.   CCA Employment/Education  Employment/Work Situation: Employment / Work Situation Employment situation: Unemployed Patient's  job has been impacted by current illness: Yes Describe how patient's job has been impacted: Patient is unable to maintain employment due to her mental illness What is the longest time patient has a held a job?: Patient has never been able to maintain any employment Where was the patient employed at that  time?: never really employed Has patient ever been in the [employer]?: No  Education: Education Is Patient Currently Attending School?: No Last Grade Completed: 6 Name of High School: patient never made it to high school Did Garment/textile technologist From [employer]?: No Did Theme park manager?: No Did Designer, television/film set?: No Did You Have Any Special Interests In School?: none reported Did You Have An Individualized Education Program (IIEP): No Did You Have Any Difficulty At School?: Yes Patient's Education Has Been Impacted by Current Illness: Yes How Does Current Illness Impact Education?: Patient was unable to complete school due to her mental illness   CCA Family/Childhood History  Family and Relationship History: Family history Marital status: Single Are you sexually active?: Yes What is your sexual orientation?: Heterosexual Has your sexual activity been affected by drugs, alcohol, medication, or emotional stress?: unable to assess Does patient have children?: Yes How many children?: 3 How is patient's relationship with their children?: Patient has been too psychotic to care for her children and patient's mother states that they are neglected.  Childhood History:  Childhood History By whom was/is the patient raised?: Mother, Father Additional childhood history information: Patient's parents were divorced.  She stayed with her mother during the school year and her father in the summer.  Mother states that patient was rebellious and they could not control her. 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 Patient's description of current relationship with people who raised him/her: Mother is supportive, father states that patient is grown and he states that his wife is geeting upset about daughter's  behaviors. How were you disciplined when you got in trouble as a child/adolescent?: Patient was disciplined fairly Does patient have siblings?: Yes Number of Siblings: 1 Description of patient's current relationship with siblings: Brother loves patient, but does not know how to help her Did patient suffer any verbal/emotional/physical/sexual abuse as a child?: No Did patient suffer from severe childhood neglect?: No Has patient ever been sexually abused/assaulted/raped as an adolescent or adult?: No Was the patient ever a victim of a crime or a disaster?: No Witnessed domestic violence?: No Has patient been affected by domestic violence as an adult?: No  Child/Adolescent Assessment:     CCA Substance Use  Alcohol/Drug Use:                           ASAM's:  Six Dimensions of Multidimensional Assessment  Dimension 1:  Acute Intoxication and/or Withdrawal Potential:      Dimension 2:  Biomedical Conditions and Complications:      Dimension 3:  Emotional, Behavioral, or Cognitive Conditions and Complications:     Dimension 4:  Readiness to Change:     Dimension 5:  Relapse, Continued use, or Continued Problem Potential:     Dimension 6:  Recovery/Living Environment:     ASAM Severity Score:    ASAM Recommended Level of Treatment:     Substance use Disorder (SUD)    Recommendations for  Services/Supports/Treatments:    DSM5 Diagnoses: Patient Active Problem List   Diagnosis Date Noted  . Brief psychotic disorder (HCC) 04/03/2020  . Intrauterine pregnancy 03/22/2020  . H/O: cesarean section 09/27/2016  . GERD without esophagitis 01/20/2013  . CARPAL TUNNEL SYNDROME 06/02/2010  . Vitamin D deficiency 04/19/2010  . Herpes genitalis in women 04/18/2010  . Goiter, unspecified 04/18/2010    Patient Centered Plan: Per Melinda Means, DNP, Inpatient treatment is recommended   Referrals to Alternative Service(s): Referred to Alternative Service(s):   Place:    Date:   Time:    Referred to Alternative Service(s):   Place:   Date:   Time:    Referred to Alternative Service(s):   Place:   Date:   Time:    Referred to Alternative Service(s):   Place:   Date:   Time:     Juvenal Umar J Shaylinn Hladik

## 2020-04-03 NOTE — Consult Note (Signed)
[facility] Face-to-Face Psychiatry Consult   Reason for Consult:  Psychosis  Referring Physician:  EDP Patient Identification: Melinda Jimenez MRN:  [id number] Principal Diagnosis: Brief psychotic disorder (HCC) Diagnosis:  Principal Problem:   Brief psychotic disorder (HCC)  Total Time spent with patient: 45 minutes  Subjective:   Melinda Jimenez is a 34 y.o. female patient who presents with chief complaint of "shortness of breath" and subjective complaints of vaginal bleeding during pregnancy (7 weeks, 4 days gestation). Psychaitric team consulted by medical team, given patient's situational "anxiety", however, on assessment, patient initially refused to speak with psych team provider while groping her breast and "Facetiming" her husband. Provider returned 10 minutes later to find patient still groping breast and displaying disorganized speech and agitation, and she requests that psych team "leave the room now" as her "mother is about to come in", although patient's mother is not currently on site/in the hospital; patient agitated and experiencing hallucinations. Of note, patient has refused blood draw from nursing team and is attempting to leave unit to "find my mother".  HPI:  Patient has had multiple presentations to the ED with somatic complaints of SOB and chest pain, however, she does not have a previous history of a psychiatric disorder documented in the chart.   Past Psychiatric History: Unable to obtain psychiatric history from patient given her current psychotic state. No documented history of past psychiatric history in the EMR.   Risk to Self:  yes Risk to Others:  none Prior Inpatient Therapy:  none Prior Outpatient Therapy:  none  Past Medical History:  Past Medical History:  Diagnosis Date  . Anemia   . Anxiety    off meds with preg  . Carpal tunnel syndrome   . Chlamydia   . Genital HSV    Last OB: > 54yr ago; Valtrex 500 mg po prn outbreaks  . GERD (gastroesophageal reflux  disease)   . Gestational diabetes    diet controlled  . Headache(784.0)   . IBS (irritable bowel syndrome)   . Infection    UTI  . Mood swings   . Panic attack   . Thyroid disease   . Trichomonas     Past Surgical History:  Procedure Laterality Date  . CESAREAN SECTION N/A 05/20/2013   Procedure: CESAREAN SECTION;  Surgeon: Brock Bad, MD;  Location: [facility] ORS;  Service: Obstetrics;  Laterality: N/A;  . CESAREAN SECTION N/A 04/06/2017   Procedure: CESAREAN SECTION;  Surgeon: Hermina Staggers, MD;  Location: [facility] BIRTHING SUITES;  Service: Obstetrics;  Laterality: N/A;  . INDUCED ABORTION  2009  . VAGINAL DELIVERY  2007   Family History:  Family History  Problem Relation Age of Onset  . Depression Mother   . Anxiety disorder Mother   . Breast cancer Other   . Bipolar disorder Brother   . Diabetes Maternal Grandmother   . Cancer Maternal Grandmother   . Hypertension Maternal Grandmother   . Hypertension Paternal Grandmother   . Autism Other   . Schizophrenia Other    Family Psychiatric  History: unable to assess  Social History:  Social History   Substance and Sexual Activity  Alcohol Use No   Comment: Twice a month     Social History   Substance and Sexual Activity  Drug Use No    Social History   Socioeconomic History  . Marital status: Single    Spouse name: Not on file  . Number of children: 1  . Years of  education: Not on file  . Highest education level: Not on file  Occupational History  . Occupation: Unemployed  Tobacco Use  . Smoking status: Passive Smoke Exposure - Never Smoker  . Smokeless tobacco: Never Used  . Tobacco comment: was not a daily smoker, tried it  Substance and Sexual Activity  . Alcohol use: No    Comment: Twice a month  . Drug use: No  . Sexual activity: Yes    Birth control/protection: None    Comment: Patient is interested in implant  Other Topics Concern  . Not on file  Social History Narrative  . Not on file   Social  Determinants of Health   Financial Resource Strain:   . Difficulty of Paying Living Expenses:   Food Insecurity:   . Worried About Programme researcher, broadcasting/film/video in the Last Year:   . Barista in the Last Year:   Transportation Needs:   . Freight forwarder (Medical):   Marland Kitchen Lack of Transportation (Non-Medical):   Physical Activity:   . Days of Exercise per Week:   . Minutes of Exercise per Session:   Stress:   . Feeling of Stress :   Social Connections:   . Frequency of Communication with Friends and Family:   . Frequency of Social Gatherings with Friends and Family:   . Attends Religious Services:   . Active Member of Clubs or Organizations:   . Attends Banker Meetings:   Marland Kitchen Marital Status:    Additional Social History:    Allergies:   Allergies  Allergen Reactions  . Zofran [Ondansetron Hcl] Nausea And Vomiting  . Latex Itching    Labs: No results found for this or any previous visit (from the past 48 hour(s)).  Current Facility-Administered Medications  Medication Dose Route Frequency Provider Last Rate Last Admin  . diphenhydrAMINE (BENADRYL) injection 50 mg  50 mg Intramuscular Once Charm Rings, NP      . OLANZapine [facility]) injection 10 mg  10 mg Intramuscular Once PRN Charm Rings, NP       Current Outpatient Medications  Medication Sig Dispense Refill  . acetaminophen (TYLENOL) 500 MG tablet Take 1 tablet (500 mg total) by mouth every 6 (six) hours as needed. (Patient taking differently: Take 500 mg by mouth every 6 (six) hours as needed for mild pain. ) 30 tablet 0  . lidocaine (LMX) 4 % cream Apply 1 application topically 3 (three) times daily as needed. (Patient not taking: Reported on 04/03/2020) 30 g 0    Musculoskeletal: Strength & Muscle Tone: within normal limits Gait & Station: normal Patient leans: N/A  Psychiatric Specialty Exam: Physical Exam Vitals and nursing note reviewed.  Constitutional:      Appearance: She is  well-developed.  HENT:     Head: Normocephalic.  Pulmonary:     Effort: Pulmonary effort is normal.  Musculoskeletal:        General: Normal range of motion.     Cervical back: Normal range of motion.  Neurological:     General: No focal deficit present.     Mental Status: She is alert.  Psychiatric:        Attention and Perception: She is inattentive. She perceives visual hallucinations.        Mood and Affect: Mood is anxious.        Speech: Speech normal.        Behavior: Behavior is agitated.  Thought Content: Thought content is delusional.        Cognition and Memory: Cognition is impaired.        Judgment: Judgment is impulsive and inappropriate.     Review of Systems  Respiratory: Positive for shortness of breath.   Psychiatric/Behavioral: Positive for agitation, decreased concentration and hallucinations. The patient is nervous/anxious.   All other systems reviewed and are negative.   Blood pressure (!) 150/85, pulse 90, temperature 98.4 F (36.9 C), temperature source Oral, resp. rate 17, height 5\' 8"  (1.727 m), SpO2 100 %.Body mass index is 33.04 kg/m.  General Appearance: Bizarre  Eye Contact:  Minimal  Speech:  Pressured  Volume:  Normal  Mood:  Anxious  Affect:  Flat  Thought Process:  Disorganized and Irrelevant  Orientation:  Negative  Thought Content:  Illogical and Hallucinations: Visual  Suicidal Thoughts:  No  Homicidal Thoughts:  No  Memory:  Immediate;   Poor  Judgement:  Poor  Insight:  Lacking  Psychomotor Activity:  Increased and Restlessness  Concentration:  Concentration: Poor  Recall:  Poor  Fund of Knowledge:  Fair  Language:  Good  Akathisia:  Negative  Handed:  Right  AIMS (if indicated):     Assets:  Housing Social Support  ADL's:  Impaired  Cognition:  Impaired,  Mild  Sleep:        Treatment Plan Summary: Brief psychotic episode: Plan IVC paperwork completed  Zyprexa 10 mg IM x 1 dose Benedryl 50 mg IM x 1  dose -inpatient psychiatric hospital needed   Disposition: Recommend psychiatric Inpatient admission when medically cleared.  , NP 04/03/2020 1:18 PM

## 2020-04-03 NOTE — ED Notes (Signed)
Med a/o

## 2020-04-03 NOTE — Progress Notes (Signed)
Patient meets criteria for inpatient treatment. [facility] does not have available beds at this time. CSW faxed referrals to the following facilities for review:  [facility]  [facility] [place]  TTS will continue to seek bed placement.   Trula Slade, MSW, LCSW Clinical Social Worker 04/03/2020 3:07 PM

## 2020-04-03 NOTE — ED Notes (Signed)
Counselor at bedside to speak with pt. Pt very upset and attempted to run out of ED. Pt requesting to see her Mother. This RN placed a call to her Mother who stated she is at the [facility] and will be here as soon as she can. Pt escorted back to room. Security at bedside for pt and staff safety.

## 2020-04-03 NOTE — ED Notes (Signed)
This RN attempted to obtain blood work, pt refused. Pt pulled arm away. MD made aware. [place] at bedside.

## 2020-04-03 NOTE — ED Notes (Signed)
This RN attempted to obtain labs a second time. Charge RN at bedside to assist as needed. Pt grabbing staff and refusing to allow [place] to obtain labs. Phlebo called for assistance. Plan to have security when phlebo arrives.

## 2020-04-03 NOTE — ED Triage Notes (Signed)
Per patient, she developed shobr yesterday, worsening today. Patient endorses vaginal bleeding starting yesterday. Denies pain.

## 2020-04-03 NOTE — ED Provider Notes (Signed)
[facility]-EMERGENCY DEPT Provider Note   CSN: [id number] Arrival date & time: 04/03/20  0710     History Chief Complaint  Patient presents with   Shortness of Breath   Vaginal Bleeding    Melinda Jimenez is a 34 y.o. female.  34 year old female presents with vaginal bleeding x1 day.  She is currently about [redacted] weeks pregnant and started having painless bright red blood per vagina today.  Denies any uterine cramping.  No fever or chills.  No urinary symptoms.  Patient also has had worsening anxiety without SI or HI.  Denies any hallucinations.  Her mother is concerned that she has had multiple ED visits for anxiety and the patient has not really had this addressed with professional mental health.        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   H/O: cesarean section 09/27/2016   GERD without esophagitis 01/20/2013   Anxiety and depression 01/20/2013   CARPAL TUNNEL SYNDROME 06/02/2010   Vitamin D deficiency 04/19/2010   Herpes genitalis in women 04/18/2010   Goiter, unspecified 04/18/2010    Past Surgical History:  Procedure Laterality Date   CESAREAN SECTION N/A 05/20/2013   Procedure: CESAREAN SECTION;  Surgeon: Brock Bad, MD;  Location: [facility];  Service: Obstetrics;  Laterality: N/A;   CESAREAN SECTION N/A 04/06/2017   Procedure: CESAREAN SECTION;  Surgeon: Hermina Staggers,  MD;  Location: [facility] BIRTHING SUITES;  Service: Obstetrics;  Laterality: N/A;   INDUCED ABORTION  2009   VAGINAL DELIVERY  2007     OB History    Gravida  10   Para  4   Term  3   Preterm  1   AB  5   Living  3     SAB  2   TAB  3   Ectopic      Multiple  0   Live Births  3           Family History  Problem Relation Age of Onset   Depression Mother    Anxiety disorder Mother    Breast cancer Other    Bipolar disorder Brother    Diabetes Maternal Grandmother    Cancer Maternal Grandmother    Hypertension Maternal Grandmother    Hypertension Paternal Grandmother    Autism Other    Schizophrenia Other     Social History   Tobacco Use   Smoking status: Passive Smoke Exposure - Never Smoker   Smokeless tobacco: Never Used   Tobacco comment: was not a daily smoker, tried it  Substance Use Topics   Alcohol use: No    Comment: Twice a month   Drug use: No    Home Medications Prior to Admission medications   Medication Sig  Start Date End Date Taking? Authorizing Provider  acetaminophen (TYLENOL) 500 MG tablet Take 1 tablet (500 mg total) by mouth every 6 (six) hours as needed. 04/01/20   Fawze, Mina A, PA-C  lidocaine (LMX) 4 % cream Apply 1 application topically 3 (three) times daily as needed. 04/01/20   Fawze, Mina A, PA-C  fluticasone (FLONASE) 50 MCG/ACT nasal spray Place 1 spray into both nostrils daily. Use for 2-3 weeks to help with the nasal congestion and ear pain Patient not taking: Reported on 01/21/2020 01/20/20 02/11/20  Gwyneth Sprout, MD  sertraline (ZOLOFT) 25 MG tablet Take one tablet daily. If tolerating, increase to two tablets in one week. Patient not taking: Reported on 01/21/2020 01/18/20 02/11/20  Arvilla Market, DO  zolpidem (AMBIEN) 5 MG tablet Take 1 tablet (5 mg total) by mouth at bedtime as needed for sleep. Patient not taking: Reported on 01/21/2020 01/20/20 02/11/20  Gwyneth Sprout, MD    Allergies      Zofran Frazier Richards hcl] and Latex  Review of Systems   Review of Systems  All other systems reviewed and are negative.   Physical Exam Updated Vital Signs BP (!) 135/92 (BP Location: Left Arm)    Pulse (!) 105    Temp 98.4 F (36.9 C) (Oral)    Resp 17    Ht 1.727 m (5\' 8" )    LMP  (LMP Unknown)    SpO2 100%    BMI 33.04 kg/m   Physical Exam Vitals and nursing note reviewed.  Constitutional:      General: She is not in acute distress.    Appearance: Normal appearance. She is well-developed. She is not toxic-appearing.  HENT:     Head: Normocephalic and atraumatic.  Eyes:     General: Lids are normal.     Conjunctiva/sclera: Conjunctivae normal.     Pupils: Pupils are equal, round, and reactive to light.  Neck:     Thyroid: No thyroid mass.     Trachea: No tracheal deviation.  Cardiovascular:     Rate and Rhythm: Normal rate and regular rhythm.     Heart sounds: Normal heart sounds. No murmur heard.  No gallop.   Pulmonary:     Effort: Pulmonary effort is normal. No respiratory distress.     Breath sounds: Normal breath sounds. No stridor. No decreased breath sounds, wheezing, rhonchi or rales.  Abdominal:     General: Bowel sounds are normal. There is no distension.     Palpations: Abdomen is soft.     Tenderness: There is no abdominal tenderness. There is no rebound.  Musculoskeletal:        General: No tenderness. Normal range of motion.     Cervical back: Normal range of motion and neck supple.  Skin:    General: Skin is warm and dry.     Findings: No abrasion or rash.  Neurological:     Mental Status: She is alert and oriented to person, place, and time.     GCS: GCS eye subscore is 4. GCS verbal subscore is 5. GCS motor subscore is 6.     Cranial Nerves: No cranial nerve deficit.     Sensory: No sensory deficit.  Psychiatric:        Attention and Perception: Attention normal.        Mood and Affect: Affect is blunt and flat.        Speech: Speech normal.         Behavior: Behavior is  withdrawn.     ED Results / Procedures / Treatments   Labs (all labs ordered are listed, but only abnormal results are displayed) Labs Reviewed  HCG, QUANTITATIVE, PREGNANCY    EKG None  Radiology No results found.  Procedures Procedures (including critical care time)  Medications Ordered in ED Medications - No data to display  ED Course  I have reviewed the triage vital signs and the nursing notes.  Pertinent labs & imaging results that were available during my care of the patient were reviewed by me and considered in my medical decision making (see chart for details).    MDM Rules/Calculators/A&P                          Patient very uncooperative during her ED visit.  Was able to obtain partial OB ultrasound which according to the tech at heart rate 156.  The radiologist unfortunately commented on adequate exam.  Patient has no abdominal pain at this time.  I attempted to do a pelvic on the patient but was unsuccessful on multiple attempts.  She has been hemodynamically stable at this time.  Given her heart rate and lack of abdominal pain my suspicion for ectopic is extremely low.  Due to patient's increasing anxiety she has been seen by the behavioral health team.  Patient became psychotic and was placed under IVC by them.  Patient has refused blood work at this time and sedation has been ordered by the behavioral health team.  I have ordered restraints and we will obtain blood work.   CRITICAL CARE Performed by: Toy Baker Total critical care time: 50 minutes Critical care time was exclusive of separately billable procedures and treating other patients. Critical care was necessary to treat or prevent imminent or life-threatening deterioration. Critical care was time spent personally by me on the following activities: development of treatment plan with patient and/or surrogate as well as nursing, discussions with consultants, evaluation of  patient's response to treatment, examination of patient, obtaining history from patient or surrogate, ordering and performing treatments and interventions, ordering and review of laboratory studies, ordering and review of radiographic studies, pulse oximetry and re-evaluation of patient's condition.  Final Clinical Impression(s) / ED Diagnoses Final diagnoses:  None    Rx / DC Orders ED Discharge Orders    None       Lorre Nick, MD 04/03/20 1325

## 2020-04-03 NOTE — ED Notes (Signed)
Pt refuse vitals recheck state that she is ok.

## 2020-04-04 LAB — RPR: RPR Ser Ql: NONREACTIVE

## 2020-04-04 NOTE — BH Assessment (Signed)
[facility] Assessment Progress Note  Per Nelly Rout, MD, this pt does not require psychiatric hospitalization at this time.  An IVC was filled out for this pt, however, it was never sent to the magistrate and is therefore not in effect; and pt is voluntary.  Pt is to be discharged from [facility].  No behavioral health referrals are indicated for this pt.  Pt's nurse has been notified.  Doylene Canning, MA Triage Specialist [phone]

## 2020-04-04 NOTE — Consult Note (Signed)
[facility] Psych ED Discharge  04/04/2020 11:59 AM Melinda Jimenez  MRN:  [id number] Principal Problem: Brief psychotic disorder [facility]) Discharge Diagnoses: Principal Problem:   Brief psychotic disorder (HCC)   Subjective: I am doing much better, my mom is with me, I am ready to go home.  Patient is a 34 year old female who was evaluated along with her mother present in the room.  Patient states that she is not suicidal, homicidal, denies any psychotic symptoms, is not responding to internal stimuli.  Mom states that she got upset when she had to have a pelvic sonogram, and that she plans to be with the patient.  Patient denies any symptoms of depression, currently denies any anxiety, does not seem to be responding to internal stimuli, denies any paranoia, hallucinations.  Mom states that patient's consult stay with her over the next few weeks, information about [facility] outpatient on [address] was given to mom and patient in case they needed it for patient  Total Time spent with patient: 20 minutes  Past Psychiatric History:   Past Medical History:  Past Medical History:  Diagnosis Date  . Anemia   . Anxiety    off meds with preg  . Carpal tunnel syndrome   . Chlamydia   . Genital HSV    Last OB: > 27yr ago; Valtrex 500 mg po prn outbreaks  . GERD (gastroesophageal reflux disease)   . Gestational diabetes    diet controlled  . Headache(784.0)   . IBS (irritable bowel syndrome)   . Infection    UTI  . Mood swings   . Panic attack   . Thyroid disease   . Trichomonas     Past Surgical History:  Procedure Laterality Date  . CESAREAN SECTION N/A 05/20/2013   Procedure: CESAREAN SECTION;  Surgeon: Brock Bad, MD;  Location: [facility];  Service: Obstetrics;  Laterality: N/A;  . CESAREAN SECTION N/A 04/06/2017   Procedure: CESAREAN SECTION;  Surgeon: Hermina Staggers, MD;  Location: [facility] BIRTHING SUITES;  Service: Obstetrics;  Laterality: N/A;  . INDUCED ABORTION  2009  . VAGINAL  DELIVERY  2007   Family History:  Family History  Problem Relation Age of Onset  . Depression Mother   . Anxiety disorder Mother   . Breast cancer Other   . Bipolar disorder Brother   . Diabetes Maternal Grandmother   . Cancer Maternal Grandmother   . Hypertension Maternal Grandmother   . Hypertension Paternal Grandmother   . Autism Other   . Schizophrenia Other    Family Psychiatric  History:  Social History:  Social History   Substance and Sexual Activity  Alcohol Use No   Comment: Twice a month     Social History   Substance and Sexual Activity  Drug Use No    Social History   Socioeconomic History  . Marital status: Single    Spouse name: Not on file  . Number of children: 1  . Years of education: Not on file  . Highest education level: Not on file  Occupational History  . Occupation: Unemployed  Tobacco Use  . Smoking status: Passive Smoke Exposure - Never Smoker  . Smokeless tobacco: Never Used  . Tobacco comment: was not a daily smoker, tried it  Substance and Sexual Activity  . Alcohol use: No    Comment: Twice a month  . Drug use: No  . Sexual activity: Yes    Birth control/protection: None    Comment: Patient is  interested in implant  Other Topics Concern  . Not on file  Social History Narrative  . Not on file   Social Determinants of Health   Financial Resource Strain:   . Difficulty of Paying Living Expenses:   Food Insecurity:   . Worried About Programme researcher, broadcasting/film/video in the Last Year:   . Barista in the Last Year:   Transportation Needs:   . Freight forwarder (Medical):   Marland Kitchen Lack of Transportation (Non-Medical):   Physical Activity:   . Days of Exercise per Week:   . Minutes of Exercise per Session:   Stress:   . Feeling of Stress :   Social Connections:   . Frequency of Communication with Friends and Family:   . Frequency of Social Gatherings with Friends and Family:   . Attends Religious Services:   . Active Member of  Clubs or Organizations:   . Attends Banker Meetings:   Marland Kitchen Marital Status:     Has this patient used any form of tobacco in the last 30 days? (Cigarettes, Smokeless Tobacco, Cigars, and/or Pipes) Prescription not provided because: denies use  Current Medications: No current facility-administered medications for this encounter.   Current Outpatient Medications  Medication Sig Dispense Refill  . acetaminophen (TYLENOL) 500 MG tablet Take 1 tablet (500 mg total) by mouth every 6 (six) hours as needed. (Patient taking differently: Take 500 mg by mouth every 6 (six) hours as needed for mild pain. ) 30 tablet 0  . lidocaine (LMX) 4 % cream Apply 1 application topically 3 (three) times daily as needed. (Patient not taking: Reported on 04/03/2020) 30 g 0   PTA Medications: (Not in a hospital admission)   Musculoskeletal: Strength & Muscle Tone: within normal limits Gait & Station: normal Patient leans: N/A  Psychiatric Specialty Exam: Physical Exam  Review of Systems  Blood pressure (!) 124/92, pulse (!) 107, temperature 98.4 F (36.9 C), temperature source Oral, resp. rate 18, height 5\' 8"  (1.727 m), SpO2 100 %.Body mass index is 33.04 kg/m.  General Appearance: Casual  Eye Contact:  Good  Speech:  Clear and Coherent and Normal Rate  Volume:  Decreased  Mood:  Anxious  Affect:  Appropriate, Congruent and Full Range  Thought Process:  Coherent, Goal Directed and Descriptions of Associations: Intact  Orientation:  Full (Time, Place, and Person)  Thought Content:  Logical and Rumination  Suicidal Thoughts:  No  Homicidal Thoughts:  No  Memory:  Immediate;   Fair Recent;   Fair Remote;   Fair  Judgement:  Intact  Insight:  Present  Psychomotor Activity:  Normal  Concentration:  Concentration: Fair and Attention Span: Fair  Recall:  of Knowledge:  Fair  Language:  Fair  Akathisia:  No  Handed:  Right  AIMS (if indicated):     Assets:  Communication  Skills Desire for Improvement Housing Social Support Transportation  ADL's:  Intact  Cognition:  WNL  Sleep:        Demographic Factors:  NA  Loss Factors: NA  Historical Factors: NA  Risk Reduction Factors:   Responsible for children under 39 years of age, Sense of responsibility to family, Religious beliefs about death, Living with another person, especially a relative, Positive social support and Positive therapeutic relationship  Continued Clinical Symptoms:  none  Cognitive Features That Contribute To Risk:  None    Suicide Risk:  Minimal: No identifiable suicidal ideation.  Patients presenting  with no risk factors but with morbid ruminations; may be classified as minimal risk based on the severity of the depressive symptoms    Plan Of Care/Follow-up recommendations:  Activity:  As tolerated Diet:  Regular Other:  Keep follow-up appointments  Disposition: Crisis and safety planning done in length with patient and mom, patient can be discharged with mom home  Nelly Rout, MD

## 2020-04-04 NOTE — ED Provider Notes (Signed)
Patient requesting to leave the emergency department. Per nursing staff she has been psychiatrically cleared. On evaluation patient is requesting leave, no SI, HI. It is noted that she presented the hospital for vaginal bleeding. Patient does not wish to discuss her initial complaint of vaginal bleeding at this time. Discussed with patient that she has not had a full workup for pregnancy with bleeding and it is unclear if she has a normal pregnancy or not. Discussed that is very important that she will need further evaluation for her vaginal bleeding. Return precautions discussed.   Tilden Fossa, MD 04/04/20 1249

## 2020-04-04 NOTE — ED Notes (Signed)
Pt continues to walk out of room looking for things like soap and a shower. This Clinical research associate continues to instruct patient to stay into her room.

## 2020-04-04 NOTE — ED Notes (Signed)
Pt in room talking with mother. Calm and cooperative with staff. Meal tray given.

## 2020-04-04 NOTE — Discharge Instructions (Addendum)
Please follow-up with your OB/GYN for recheck. You did not have a full assessment of your vaginal bleeding. Get rechecked immediately if you develop abdominal pain, worsening bleeding, difficulty breathing or new concerning symptoms. Start taking a prenatal vitamin, available over-the-counter.

## 2020-04-04 NOTE — ED Notes (Signed)
Pt upset about getting phone taken away. Pt yelling about why is she still her and unable to see her mother. Mother on the phone saying I never signed anything. Spoke with Mother in the lobby and provided with psych guidelines. Went back and talked to pt about why she is still in the ER.

## 2020-04-04 NOTE — ED Notes (Signed)
Asked for urine refused

## 2020-04-04 NOTE — ED Notes (Signed)
Pt personal belonging at nurse station 19- 22

## 2020-04-13 ENCOUNTER — Encounter: Payer: Self-pay | Admitting: General Practice

## 2020-05-11 ENCOUNTER — Ambulatory Visit: Payer: Medicaid Other

## 2020-05-11 ENCOUNTER — Telehealth: Payer: Self-pay

## 2020-05-11 DIAGNOSIS — Z3490 Encounter for supervision of normal pregnancy, unspecified, unspecified trimester: Secondary | ICD-10-CM | POA: Insufficient documentation

## 2020-05-11 NOTE — Telephone Encounter (Signed)
TC to pt to start NOB Intake no answer and no vm is set up.Pt called and spoke with scheduling requesting in office appt due to discomfort and bleeding patient was advised to report to the hospital for an evaluation.

## 2020-05-14 ENCOUNTER — Other Ambulatory Visit: Payer: Self-pay

## 2020-05-14 ENCOUNTER — Encounter (HOSPITAL_COMMUNITY): Payer: Self-pay | Admitting: Emergency Medicine

## 2020-05-14 ENCOUNTER — Emergency Department (HOSPITAL_COMMUNITY)
Admission: EM | Admit: 2020-05-14 | Discharge: 2020-05-14 | Disposition: A | Discharge status: Home / Self Care | Payer: Medicaid Other | Attending: Emergency Medicine | Admitting: Emergency Medicine

## 2020-05-14 DIAGNOSIS — N939 Abnormal uterine and vaginal bleeding, unspecified: Secondary | ICD-10-CM | POA: Diagnosis not present

## 2020-05-14 DIAGNOSIS — Z5321 Procedure and treatment not carried out due to patient leaving prior to being seen by health care provider: Secondary | ICD-10-CM | POA: Diagnosis not present

## 2020-05-14 DIAGNOSIS — R531 Weakness: Secondary | ICD-10-CM | POA: Insufficient documentation

## 2020-05-14 MED ORDER — SODIUM CHLORIDE 0.9% FLUSH: 3.0000 mL | Freq: Once | INTRAVENOUS | Status: DC

## 2020-05-14 NOTE — ED Notes (Signed)
Pt name called for updated vitals, no response 

## 2020-05-14 NOTE — ED Triage Notes (Signed)
Pt has been in peds with her children that were patients and now returns to the adult ED.  Reports generalized weakness for "over 2 weeks" and feeling tired.  No known COVID contacts.  Reports intermittent vaginal bleeding x 2 weeks.

## 2020-05-18 ENCOUNTER — Encounter: Payer: Medicaid Other | Admitting: Obstetrics

## 2020-05-18 DIAGNOSIS — Z3490 Encounter for supervision of normal pregnancy, unspecified, unspecified trimester: Secondary | ICD-10-CM

## 2020-07-04 ENCOUNTER — Emergency Department (HOSPITAL_COMMUNITY)
Admission: EM | Admit: 2020-07-04 | Discharge: 2020-07-04 | Discharge status: Against Medical Advice | Payer: Medicaid Other

## 2020-07-04 NOTE — ED Notes (Signed)
Called for Pt in Waiting Room. No answer

## 2020-07-04 NOTE — Progress Notes (Addendum)
Cardiology Office Note:    Date:  07/06/2020   ID:  Melinda Jimenez, DOB September 26, 1986, MRN [id number]  PCP:  Arvilla Market, DO  [facility] Cardiologist:  No primary care provider on file.  [facility] Electrophysiologist:  None   Referring MD: Jeanie Sewer, PA-C    History of Present Illness:    Melinda Jimenez is a 34 y.o. female  with a hx of anxiety, depression, gestational diabetes and GERD who was referred by Michela Pitcher for chest pain. Of note, there is concern that the patient is currently pregnant based on labs in 03/2020, however, she believes she has miscarried. She has not had OB follow-up to re-evaluate.  Per review of the record, patient has been in the ED on multiple occasions for concern for vaginal bleeding, chest pain, and SOB. Cardiac work-up has fortunately been reassuring with low suspicion of ACS. Last visit to ED showed beta-hcg of 86,000 with un-interpretable abdominal ultrasound due to poor visualization. The patient's pregnancy status is unclear and she has not had follow-up with OBGYN.  Today, the was very tearful and distraught during the visit. She is very concerned that she has the "devil in her house" and she "has let in the demons." She is repeatedly expressed her concern that she is "going to die" and is "going to go to hell." She reports that people have told her that she "may have cancer" and "may have blood clots," but she has not had significant work-up. Of note, her beta-hcg in our system was [phone]. Abdominal ultrasound was very limited and was unable to confirm presence of a fetus. The patient states she has had a miscarriage as she had significant vaginal bleeding, but there has been no OB follow-up. She is very distraught and states that she just "feels very unwell" and "very scared." She does not currently endorse chest pain, swelling or orthopnea, however, history was limited as she was mainly focused on her concern about being cursed. Family  was present and helped calm the patient down, but unfortunately they are unable to describe her symptoms much further.   Over 1 hour was spent in the room with the patient and her family. We emphasized the importance of first figuring out if she was pregnant before we proceed with testing as this will alter the course of how we work-up her chest pain or manage her medications. We also discussed connecting with a therapist, OBGYN, and a PCP so that she can receive more thorough, well-rounded care.  Labs Na 138, K 3.3, Cr 0.62, HgB 10.8, TSH 1.272, WBC 10.2, Plt 352  Last Psych Note 04/04/20: "Risk Reduction Factors:   Responsible for children under 55 years of age, Sense of responsibility to family, Religious beliefs about death, Living with another person, especially a relative, Positive social support and Positive therapeutic relationship  Suicide Risk:  Minimal: No identifiable suicidal ideation.  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 ago; Valtrex 500 mg po prn outbreaks   GERD (gastroesophageal reflux disease)    Gestational diabetes    diet controlled   Headache(784.0)    IBS (irritable bowel syndrome)  Infection    UTI   Mood swings    Panic attack    Thyroid disease    Trichomonas     Past Surgical History:  Procedure Laterality Date   CESAREAN SECTION N/A 05/20/2013   Procedure: CESAREAN SECTION;  Surgeon: Brock Bad, MD;  Location: [facility];  Service: Obstetrics;  Laterality: N/A;   CESAREAN SECTION N/A 04/06/2017   Procedure: CESAREAN SECTION;  Surgeon: Hermina Staggers, MD;  Location: [facility] BIRTHING SUIT[facility];  Service: Obstetrics;  Laterality: N/A;   INDUCED ABORTION  2009   VAGINAL  DELIVERY  2007    Current Medications: No outpatient medications have been marked as taking for the 07/06/20 encounter (Office Visit) with Meriam Sprague, MD.     Allergies:   Zofran Frazier Richards hcl] and Latex   Social History   Socioeconomic History   Marital status: Single    Spouse name: Not on file   Number of children: 1   Years of education: Not on file   Highest education level: Not on file  Occupational History   Occupation: Unemployed  Tobacco Use   Smoking status: Passive Smoke Exposure - Never Smoker   Smokeless tobacco: Never Used   Tobacco comment: was not a daily smoker, tried it  Substance and Sexual Activity   Alcohol use: No    Comment: Twice a month   Drug use: No   Sexual activity: Yes    Birth control/protection: None    Comment: Patient is interested in implant  Other Topics Concern   Not on file  Social History Narrative   Not on file   Social Determinants of Health   Financial Resource Strain:    Difficulty of Paying Living Expenses: Not on file  Food Insecurity:    Worried About Programme researcher, broadcasting/film/video in the Last Year: Not on file   [employer] of Food in the Last Year: Not on file  Transportation Needs:    Lack of Transportation (Medical): Not on file   Lack of Transportation (Non-Medical): Not on file  Physical Activity:    Days of Exercise per Week: Not on file   Minutes of Exercise per Session: Not on file  Stress:    Feeling of Stress : Not on file  Social Connections:    Frequency of Communication with Friends and Family: Not on file   Frequency of Social Gatherings with Friends and Family: Not on file   Attends Religious Services: Not on file   Active Member of Clubs or Organizations: Not on file   Attends Banker Meetings: Not on file   Marital Status: Not on file     Family History: The patient's family history includes Anxiety disorder in her mother; Autism in an other family member;  Bipolar disorder in her brother; Breast cancer in an other family member; Cancer in her maternal grandmother; Depression in her mother; Diabetes in her maternal grandmother; Hypertension in her maternal grandmother and paternal grandmother; Schizophrenia in an other family member.  ROS:   Notable for tangential thoughts, significant anxiety, tearfulness, intermittent chest pressure, leg pain, stomach pain.   EKGs/Labs/Other Studies Reviewed:    The following studies were reviewed today: 04/03/20 abdominal ultrasound: COMPARISON:  None.  FINDINGS: The exam is very limited as the patient refused to complete transabdominal exam and declined transvaginal exam. As a result, intrauterine gestational sac, yolk sac, embryo, cardiac activity of the embryo are not well evaluated.  MSD: Not evaluated due to limited  exam.  CRL: Not evaluated due to limited exam.  Subchorionic hemorrhage:  Limited exam as described.  Maternal uterus/adnexae: Limited exam as described.  IMPRESSION: The exam is very limited as the patient refused to complete transabdominal exam and declined transvaginal exam. As a result, intrauterine gestational sac, yolk sac, embryo, cardiac activity of the embryo are not well evaluated.  Transvaginal ultrasound 03/22/2020: IMPRESSION: 1. Single intrauterine gestation with gestational age of [redacted] weeks 1 day by crown-rump length and heart rate of 116 beats per minute.  EKG:  EKG is ordered today.  The ekg ordered today demonstrates sinus tachycardia with HR 103, early r-wave progression, LVH  Recent Labs: 04/03/2020: ALT 22; BUN 6; Creatinine, Ser 0.62; Hemoglobin 10.8; Platelets 352; Potassium 3.3; Sodium 138; TSH 1.272  Recent Lipid Panel No results found for: CHOL, TRIG, HDL, CHOLHDL, VLDL, LDLCALC, LDLDIRECT  Physical Exam:    VS:  BP 110/74    Pulse (!) 110    Ht 5\' 7"  (1.702 m)    Wt 202 lb (91.6 kg)    LMP  (LMP Unknown)    SpO2 98%    BMI 31.64 kg/m     Wt  Readings from Last 3 Encounters:  07/06/20 202 lb (91.6 kg)  03/29/20 217 lb 4.8 oz (98.6 kg)  03/22/20 217 lb 4.8 oz (98.6 kg)     GEN: Tearful, very anxious, tangential thinking HEENT: Normal NECK: No JVD; No carotid bruits LYMPHATICS: No lymphadenopathy CARDIAC: Tachycardic, regular, 2/6 SEM,  rubs, gallops RESPIRATORY:  Clear to auscultation without rales, wheezing or rhonchi  ABDOMEN: Soft, non-tender, non-distended MUSCULOSKELETAL:  No edema; No deformity  SKIN: Warm and dry NEUROLOGIC:  Alert and oriented x 3 PSYCHIATRIC:  Very tearful, tangential thinking, concerned that "she is going to die" and "going to go to hell" and that her "house has the devil inside"   ASSESSMENT:    1. Chest pain of uncertain etiology   2. Elevated serum hCG    PLAN:    In order of problems listed above:  Over 60 minutes was spent with the patient and her family. The patient appears very distressed, anxious and scared. She is concerned that she "is going to die," but is unable to elaborate on this further. She is also very worried that the "devil has come into her house" and that she "may go to hell." Per ED notes, she has been having intermittent chest pressure but when asked about this during our visit today, she would respond that she was "just so scared." Given that the patient may be pregnant and has not had follow-up regarding her recent vaginal bleeding, our first priority will be to determine if she still has a viable pregnancy or if she has retained intrauterine contents as this will signficantly alter management and cardiovascular work-up. She is very wary of the healthcare system and refused a blood draw by our phlebotomist today but states she will return tomorrow for a blood draw if a female is performing it. We will also schedule an abdominal ultrasound to assess pregnancy status. She needs both OB and Psych follow-up, however, she was very overwhelmed when I suggested this during the visit.  Will attempt to gather information now and refer her for follow-up with PCP, OBGYN and psych. She notably has seen a therapist in the past and I stated that it is a good idea to reconnect with her if she had a good relationship as she would likely benefit from being able to speak  with someone. I am very worried about her mental well being and hope that she is able to seek help from a psychiatric provider who she trusts. In terms of her cardiovascular status, trop was 13-->10 in the ED, ECG with LVH but no ischemic changes, and patient is stable and not complaining of pain today. Will prioritize OB work-up and mental health and follow-up from a CV standpoint once we have more information regarding pregnancy status.   Plan: -Check beta hcg -Check abdominal ultrasound to assess for pregnancy or retained intrauterine contents -Recommend OBGYN, psych., PCP and follow-up with therapist for mental health; has been evaluated by psych on prior admissions and may benefit from routine follow-up care as out-patient -Once above work-up is performed, can pursue further CV testing for chest pain if warranted   Medication Adjustments/Labs and Tests Ordered: Current medicines are reviewed at length with the patient today.  Concerns regarding medicines are outlined above.  Orders Placed This Encounter  Procedures   [place] MFM MCA ADDL GEST   B-HCG Quant   EKG 12-Lead   No orders of the defined types were placed in this encounter.   Patient Instructions  Medication Instructions:  *If you need a refill on your cardiac medications before your next appointment, please call your pharmacy*  Lab Work: Your physician has recommended that you have lab work today: Beta HcG If you have labs (blood work) drawn today and your tests are completely normal, you will receive your results only by:  MyChart Message (if you have MyChart) OR  A paper copy in the mail If you have any lab test that is abnormal or we need to  change your treatment, we will call you to review the results.  Testing/Procedures: Your physician recommends that you have a pelvic ultrasound to evaluate status of pregnancy.  Follow-Up: At [facility], you and your health needs are our priority.  As part of our continuing mission to provide you with exceptional heart care, we have created designated Provider Care Teams.  These Care Teams include your primary Cardiologist (physician) and Advanced Practice Providers (APPs -  Physician Assistants and Nurse Practitioners) who all work together to provide you with the care you need, when you need it.  We recommend signing up for the patient portal called "MyChart".  Sign up information is provided on this After Visit Summary.  MyChart is used to connect with patients for Virtual Visits (Telemedicine).  Patients are able to view lab/test results, encounter notes, upcoming appointments, etc.  Non-urgent messages can be sent to your provider as well.   To learn more about what you can do with MyChart, go to [email or web address].    Your next appointment:   Your physician recommends that you schedule a follow-up appointment in: 3 MONTHS  The format for your next appointment:   In Person with Laurance Flatten, MD        Signed, Meriam Sprague, MD  07/06/2020 6:03 PM    [facility]

## 2020-07-04 NOTE — ED Notes (Signed)
NT Inetta Fermo called for Pt again but No answer

## 2020-07-06 ENCOUNTER — Encounter: Payer: Self-pay | Admitting: Cardiology

## 2020-07-06 ENCOUNTER — Ambulatory Visit (INDEPENDENT_AMBULATORY_CARE_PROVIDER_SITE_OTHER): Payer: Medicaid Other | Admitting: Cardiology

## 2020-07-06 ENCOUNTER — Other Ambulatory Visit: Payer: Self-pay

## 2020-07-06 VITALS — BP 110/74 | HR 110 | Ht 67.0 in | Wt 202.0 lb

## 2020-07-06 DIAGNOSIS — E349 Endocrine disorder, unspecified: Secondary | ICD-10-CM

## 2020-07-06 DIAGNOSIS — R079 Chest pain, unspecified: Secondary | ICD-10-CM | POA: Diagnosis not present

## 2020-07-06 NOTE — Patient Instructions (Signed)
Medication Instructions:  *If you need a refill on your cardiac medications before your next appointment, please call your pharmacy*  Lab Work: Your physician has recommended that you have lab work today: Beta HcG If you have labs (blood work) drawn today and your tests are completely normal, you will receive your results only by: Marland Kitchen MyChart Message (if you have MyChart) OR . A paper copy in the mail If you have any lab test that is abnormal or we need to change your treatment, we will call you to review the results.  Testing/Procedures: Your physician recommends that you have a pelvic ultrasound to evaluate status of pregnancy.  Follow-Up: At [facility], you and your health needs are our priority.  As part of our continuing mission to provide you with exceptional heart care, we have created designated Provider Care Teams.  These Care Teams include your primary Cardiologist (physician) and Advanced Practice Providers (APPs -  Physician Assistants and Nurse Practitioners) who all work together to provide you with the care you need, when you need it.  We recommend signing up for the patient portal called "MyChart".  Sign up information is provided on this After Visit Summary.  MyChart is used to connect with patients for Virtual Visits (Telemedicine).  Patients are able to view lab/test results, encounter notes, upcoming appointments, etc.  Non-urgent messages can be sent to your provider as well.   To learn more about what you can do with MyChart, go to [email or web address].    Your next appointment:   Your physician recommends that you schedule a follow-up appointment in: 3 MONTHS  The format for your next appointment:   In Person with Laurance Flatten, MD

## 2020-07-07 ENCOUNTER — Telehealth: Payer: Self-pay

## 2020-07-07 ENCOUNTER — Other Ambulatory Visit: Payer: Medicaid Other

## 2020-07-07 ENCOUNTER — Telehealth: Payer: Self-pay | Admitting: Cardiology

## 2020-07-07 ENCOUNTER — Other Ambulatory Visit: Payer: Self-pay

## 2020-07-07 DIAGNOSIS — E349 Endocrine disorder, unspecified: Secondary | ICD-10-CM

## 2020-07-07 NOTE — Telephone Encounter (Signed)
Patient reported to the office around 12:50 pm to have BW drawn as discussed in length yesterday at OV with Dr. Shari Prows. Pt's husband, aunt, sister, and mother were all informed of the plan of care for the patient and the importance of confirming the current pregnancy status of the patient. Upon entering the lab the patient denied consent to Lab tech/phlebotomist to draw blood for the labs and proceeded to leave.   Pt went down stairs to the car with her husband and mother. I called the patient once it was brought to my attention that she refused the BW. I reiterated to the patient over and over, that as discussed yesterday, it is vital to her care that we have her BW completed. I even offered to collect the patients BW myself if it would make her feel more comfortable. The pt stated "She (the lab tech) looked like she didn't want to do it and she is insulting my intelligence." After about 15 min on the phone the patient stated she would come back and have BW.   I am concerned with the health and the well being of the patient and the potential fetus. The patient is having very irregular thought and behavior pattern. She is having very brief moments where she is actually lucid and able to speak clearly. Otherwise she is saying things that are of very "imaginative". I will will make Dr. Shari Prows aware but I do not believe the patient will return for BW.

## 2020-07-07 NOTE — Telephone Encounter (Signed)
Called the patient and unfortunately it went to her voicemail. As detailed in our prior notes, we are very concerned about the patient's mental status as well as her current pregnancy status. She left our office today without having her blood drawn and her beta-hcg checked. We expressed our concern to her and her family at length but unfortunately she continues to leave prior to a thorough work-up. Would recommend that she seeks care at the [facility], however, I am unable to get in touch with the patient at the current time. Unfortunately, I do not believe she will continue to follow-up and the most important aspects of her care at this point in time would be psychiatry and OBGYN. We will continue to try and contact her.  Laurance Flatten, MD

## 2020-07-27 ENCOUNTER — Telehealth: Payer: Self-pay | Admitting: Cardiology

## 2020-07-27 NOTE — Telephone Encounter (Signed)
Discussed with Clinic Supervisor Daleen Bo, RN, BSN, for further advice. Per Clinic Supervisor I will send to her Daleen Bo, RN, [employer]).

## 2020-07-27 NOTE — Telephone Encounter (Signed)
Patient followed up to reschedule lab work missed on 07/07/20. After scheduling lab work (beta-hcg) patient became upset and expressed concern with the way she has been treated. She stated everyone has been giving her the run-around regarding seeking medical records. She states her family has requested for her to obtain all her medical records from our office as well as from [facility]. Per patient, her mother has been inquiring about her diagnosis and why she needed to follow up with a cardiologist. She assumes the medical records will provide insight as to why she has been seen. Please assist patient.

## 2020-07-27 NOTE — Telephone Encounter (Signed)
Late entry:  Attempted to contact patient earlier but there was no answer.  Called and spoke to patient. She states that she is requesting a copy of her medical records from our office as well as [facility]. Made patient aware that when she comes in for her lab work (beta-hcg) tomorrow that she can ask for me or Medical Records in order to fill out the authorization forms to receive a copy of her Medical Records. She verbalized understanding.   Patient states that she feels like she has been given the run around and not been taken care of. She denies having an cardiac Sx at this time. Made patient aware that per Dr. Shari Prows she was referred to [place] for an episode of chest pain that she had during on of her ER visits. Made her aware that there was some question regarding her pregnancy status and in order for [place] to move forward we would need to clarify that. Patient states that we already know that she is not pregnant. Made patient aware that we will just need to clarify that in order to move forward and that she should keep her lab appointment tomorrow. The phone hung up (not sure if the patient hung up or if it was disconnected). Attempted to call the patient back several times with no answer.  Left detailed message on VM (per DPR) instructing the patient to keep her lab appointment tomorrow and ask for me or Medical Records to get the forms signed. Instructed the patient to call back with any additional questions or concerns.

## 2020-07-28 ENCOUNTER — Other Ambulatory Visit: Payer: Medicaid Other

## 2020-07-29 ENCOUNTER — Other Ambulatory Visit: Payer: Medicaid Other

## 2020-08-02 ENCOUNTER — Inpatient Hospital Stay (HOSPITAL_COMMUNITY)
Admission: AD | Admit: 2020-08-02 | Discharge: 2020-08-02 | Disposition: A | Discharge status: Home / Self Care | Payer: Medicaid Other | Attending: Obstetrics and Gynecology | Admitting: Obstetrics and Gynecology

## 2020-08-02 ENCOUNTER — Inpatient Hospital Stay (HOSPITAL_BASED_OUTPATIENT_CLINIC_OR_DEPARTMENT_OTHER): Payer: Medicaid Other

## 2020-08-02 ENCOUNTER — Inpatient Hospital Stay (HOSPITAL_COMMUNITY): Payer: Medicaid Other

## 2020-08-02 ENCOUNTER — Encounter (HOSPITAL_COMMUNITY): Payer: Self-pay | Admitting: Obstetrics and Gynecology

## 2020-08-02 ENCOUNTER — Other Ambulatory Visit: Payer: Self-pay

## 2020-08-02 DIAGNOSIS — O26892 Other specified pregnancy related conditions, second trimester: Secondary | ICD-10-CM | POA: Diagnosis not present

## 2020-08-02 DIAGNOSIS — N898 Other specified noninflammatory disorders of vagina: Secondary | ICD-10-CM | POA: Diagnosis not present

## 2020-08-02 DIAGNOSIS — O99891 Other specified diseases and conditions complicating pregnancy: Secondary | ICD-10-CM

## 2020-08-02 DIAGNOSIS — O26852 Spotting complicating pregnancy, second trimester: Secondary | ICD-10-CM | POA: Diagnosis not present

## 2020-08-02 DIAGNOSIS — Z7722 Contact with and (suspected) exposure to environmental tobacco smoke (acute) (chronic): Secondary | ICD-10-CM | POA: Diagnosis not present

## 2020-08-02 DIAGNOSIS — R1110 Vomiting, unspecified: Secondary | ICD-10-CM | POA: Insufficient documentation

## 2020-08-02 DIAGNOSIS — Z363 Encounter for antenatal screening for malformations: Secondary | ICD-10-CM | POA: Insufficient documentation

## 2020-08-02 DIAGNOSIS — M79606 Pain in leg, unspecified: Secondary | ICD-10-CM

## 2020-08-02 DIAGNOSIS — O99212 Obesity complicating pregnancy, second trimester: Secondary | ICD-10-CM | POA: Diagnosis not present

## 2020-08-02 DIAGNOSIS — O4692 Antepartum hemorrhage, unspecified, second trimester: Secondary | ICD-10-CM

## 2020-08-02 DIAGNOSIS — F22 Delusional disorders: Secondary | ICD-10-CM | POA: Insufficient documentation

## 2020-08-02 DIAGNOSIS — Z3490 Encounter for supervision of normal pregnancy, unspecified, unspecified trimester: Secondary | ICD-10-CM

## 2020-08-02 DIAGNOSIS — R109 Unspecified abdominal pain: Secondary | ICD-10-CM | POA: Diagnosis not present

## 2020-08-02 DIAGNOSIS — O0932 Supervision of pregnancy with insufficient antenatal care, second trimester: Secondary | ICD-10-CM

## 2020-08-02 DIAGNOSIS — O99612 Diseases of the digestive system complicating pregnancy, second trimester: Secondary | ICD-10-CM | POA: Insufficient documentation

## 2020-08-02 DIAGNOSIS — K625 Hemorrhage of anus and rectum: Secondary | ICD-10-CM | POA: Insufficient documentation

## 2020-08-02 DIAGNOSIS — R531 Weakness: Secondary | ICD-10-CM | POA: Diagnosis not present

## 2020-08-02 DIAGNOSIS — R200 Anesthesia of skin: Secondary | ICD-10-CM | POA: Diagnosis not present

## 2020-08-02 DIAGNOSIS — Z3A25 25 weeks gestation of pregnancy: Secondary | ICD-10-CM

## 2020-08-02 DIAGNOSIS — F29 Unspecified psychosis not due to a substance or known physiological condition: Secondary | ICD-10-CM

## 2020-08-02 DIAGNOSIS — E669 Obesity, unspecified: Secondary | ICD-10-CM

## 2020-08-02 DIAGNOSIS — F23 Brief psychotic disorder: Secondary | ICD-10-CM

## 2020-08-02 DIAGNOSIS — O99342 Other mental disorders complicating pregnancy, second trimester: Secondary | ICD-10-CM | POA: Insufficient documentation

## 2020-08-02 LAB — URINALYSIS, ROUTINE W REFLEX MICROSCOPIC
Bilirubin Urine: NEGATIVE
Glucose, UA: NEGATIVE mg/dL
Hgb urine dipstick: NEGATIVE
Ketones, ur: 20 mg/dL — AB
Leukocytes,Ua: NEGATIVE
Nitrite: NEGATIVE
Protein, ur: 30 mg/dL — AB
Specific Gravity, Urine: 1.028 (ref 1.005–1.030)
pH: 5 (ref 5.0–8.0)

## 2020-08-02 LAB — CBC
HCT: 35.7 % — ABNORMAL LOW (ref 36.0–46.0)
Hemoglobin: 11.1 g/dL — ABNORMAL LOW (ref 12.0–15.0)
MCH: 23.7 pg — ABNORMAL LOW (ref 26.0–34.0)
MCHC: 31.1 g/dL (ref 30.0–36.0)
MCV: 76.1 fL — ABNORMAL LOW (ref 80.0–100.0)
Platelets: 325 10*3/uL (ref 150–400)
RBC: 4.69 MIL/uL (ref 3.87–5.11)
RDW: 15.3 % (ref 11.5–15.5)
WBC: 11.6 10*3/uL — ABNORMAL HIGH (ref 4.0–10.5)
nRBC: 0 % (ref 0.0–0.2)

## 2020-08-02 LAB — HCG, QUANTITATIVE, PREGNANCY: hCG, Beta Chain, Quant, S: 11468 m[IU]/mL — ABNORMAL HIGH (ref <5)

## 2020-08-02 LAB — POCT PREGNANCY, URINE: Preg Test, Ur: POSITIVE — AB

## 2020-08-02 MED ORDER — PREPLUS 27-1 MG PO TABS
1.0000 | ORAL_TABLET | Freq: Every day | ORAL | 13 refills | Status: DC
Start: 2020-08-02 — End: 2020-11-17

## 2020-08-02 NOTE — MAU Note (Signed)
Pt refused GC/Chlamydia and wet prep swabs provider in room at time of refusal. Sabas Sous, CNM

## 2020-08-02 NOTE — MAU Note (Signed)
Presents with c/o fatigue, vaginal discharge, and vomiting.  Reports able to keep food down, but vomits daily.  Also c/o white vaginal discharge, doesn't have any odor.  Denies VB, reports rectal bleeding.  States passes clots and toilet "full of blood".  Pt states she's unsure if she's pregnant.

## 2020-08-02 NOTE — MAU Provider Note (Signed)
History     CSN: [id number]  Arrival date and time: 08/02/20 1401   None     Chief Complaint  Patient presents with  . Vaginal Discharge  . Fatigue   Melinda Jimenez is a 34 y.o. [id number] at [redacted]w[redacted]d who has not established.  She presents today for Vaginal Discharge and Fatigue.  Provider intially to bedside and patient praying with SO.  Provider returns shortly after and patient continues to pray, but [employer]- speaks on patient behalf.  He reports that patient presents today for intermittent bleeding/spotting since July as well as body pain. He questions if patient is still pregnant stating that "with that much blood I don't know how she can be pregnant still."  He states patient complains daily about stomach pain and when provider attempts to ask patient to describe, she is unable.  SO also reports patient with c/o daily fatigue and patient then states she is unable to care for her children and returns to praying.  SO states patient has loss "a lot of weight" despite "eating everything."  Patient is able to endorse this and reports she has been vomiting daily and experiencing weakness and numbness, in her legs, for 2 weeks. She also reports vaginal discharge with itching, but states it is improved with cream.  Patient states she has been experiencing rectal bleeding daily-"clots in the toilet and the toilet is full of blood."  She states that this occurs every other day when she has a bowel movement and SO reports that patient's stools are "hard like a cork."  SO also states he did not see any hemorrhoids. Patient questions provider in regards to witchery and proclaims that she will "preach against witchcraft and sorcery." Patient also comments that this provider is a witch and that "God will make you pay for what you have done."  Patient with several more of these comments, but also has periods of lucidity where conversation is appropriate.      OB History    Gravida  10   Para  4    Term  3   Preterm  1   AB  5   Living  3     SAB  2   TAB  3   Ectopic      Multiple  0   Live Births  3           Past Medical History:  Diagnosis Date  . Anemia   . Anxiety    off meds with preg  . Carpal tunnel syndrome   . Chlamydia   . Genital HSV    Last OB: > 54yr ago; Valtrex 500 mg po prn outbreaks  . GERD (gastroesophageal reflux disease)   . Gestational diabetes    diet controlled  . Headache(784.0)   . IBS (irritable bowel syndrome)   . Infection    UTI  . Mood swings   . Panic attack   . Thyroid disease   . Trichomonas     Past Surgical History:  Procedure Laterality Date  . CESAREAN SECTION N/A 05/20/2013   Procedure: CESAREAN SECTION;  Surgeon: Brock Bad, MD;  Location: [facility];  Service: Obstetrics;  Laterality: N/A;  . CESAREAN SECTION N/A 04/06/2017   Procedure: CESAREAN SECTION;  Surgeon: Hermina Staggers, MD;  Location: [facility] BIRTHING SUIT[facility];  Service: Obstetrics;  Laterality: N/A;  . INDUCED ABORTION  2009  . VAGINAL DELIVERY  2007    Family History  Problem Relation Age  of Onset  . Depression Mother   . Anxiety disorder Mother   . Breast cancer Other   . Bipolar disorder Brother   . Diabetes Maternal Grandmother   . Cancer Maternal Grandmother   . Hypertension Maternal Grandmother   . Hypertension Paternal Grandmother   . Autism Other   . Schizophrenia Other     Social History   Tobacco Use  . Smoking status: Passive Smoke Exposure - Never Smoker  . Smokeless tobacco: Never Used  . Tobacco comment: was not a daily smoker, tried it  Substance Use Topics  . Alcohol use: No    Comment: Twice a month  . Drug use: No    Allergies:  Allergies  Allergen Reactions  . Zofran [Ondansetron Hcl] Nausea And Vomiting  . Latex Itching    No medications prior to admission.    Review of Systems  Constitutional: Positive for fatigue. Negative for chills and fever.  Gastrointestinal: Positive for nausea and vomiting.  Negative for constipation and diarrhea.  Genitourinary: Positive for vaginal bleeding and vaginal discharge. Negative for difficulty urinating and dysuria.  Neurological: Negative for dizziness, light-headedness and headaches.   Physical Exam   Blood pressure 112/70, pulse (!) 103, temperature 98.4 F (36.9 C), temperature source Oral, resp. rate 20, height 5\' 7"  (1.702 m), weight 93.9 kg, SpO2 99 %, unknown if currently breastfeeding.  Physical Exam Constitutional:      General: She is not in acute distress.    Appearance: Normal appearance. She is obese.  HENT:     Head: Normocephalic and atraumatic.  Eyes:     Conjunctiva/sclera: Conjunctivae normal.  Cardiovascular:     Rate and Rhythm: Normal rate and regular rhythm.     Heart sounds: Normal heart sounds.  Pulmonary:     Effort: Pulmonary effort is normal. No respiratory distress.     Breath sounds: Normal breath sounds.  Abdominal:     Palpations: Abdomen is soft.     Tenderness: There is no abdominal tenderness.  Genitourinary:    Comments: Patient initially agreeable to VE, but then declines.  Musculoskeletal:        General: No tenderness. Normal range of motion.     Cervical back: Normal range of motion and neck supple.  Skin:    General: Skin is warm and dry.  Neurological:     Mental Status: She is alert and oriented to person, place, and time.  Psychiatric:        Attention and Perception: Attention normal.        Mood and Affect: Affect is labile.        Speech: Speech is tangential.        Behavior: Behavior is agitated.        Thought Content: Thought content is paranoid and delusional.     MAU Course  Procedures  Results for orders placed or performed during the hospital encounter of 08/02/20 (from the past 24 hour(s))  Pregnancy, urine POC     Status: Abnormal   Collection Time: 08/02/20  2:48 PM  Result Value Ref Range   Preg Test, Ur POSITIVE (A) NEGATIVE  hCG, quantitative, pregnancy     Status:  Abnormal   Collection Time: 08/02/20  3:27 PM  Result Value Ref Range   hCG, Beta Chain, Quant, S 11,468 (H) <5 mIU/mL  CBC     Status: Abnormal   Collection Time: 08/02/20  3:27 PM  Result Value Ref Range   WBC 11.6 (H) 4.0 -  10.5 K/uL   RBC 4.69 3.87 - 5.11 MIL/uL   Hemoglobin 11.1 (L) 12.0 - 15.0 g/dL   HCT 16.135.7 (L) 36 - 46 %   MCV 76.1 (L) 80.0 - 100.0 fL   MCH 23.7 (L) 26.0 - 34.0 pg   MCHC 31.1 30.0 - 36.0 g/dL   RDW 09.615.3 04.511.5 - 40.915.5 %   Platelets 325 150 - 400 K/uL   nRBC 0.0 0.0 - 0.2 %   US MFM OB DETAIL +14 WK  Result Date: 08/02/2020 ----------------------------------------------------------------------  OBSTETRICS REPORT                       (Signed Final 08/02/2020 04:16 pm) ---------------------------------------------------------------------- Patient Info  ID #:       [id number]                          D.O.B.:  Jul 05, 1986 (34 yrs)  Name:       Melinda Jimenez                   Visit Date: 08/02/2020 04:05 pm ---------------------------------------------------------------------- Performed By  Attending:        Noralee Spaceavi Shankar MD        [facility]  Performed By:     Percell BostonHeather Waken          [facility] ---------------------------------------------------------------------- Orders  #  Description                           Code        Ordered By  1  US MFM OB DETAIL +14 WK               [id number]    Keene Gilkey ----------------------------------------------------------------------  #  Order #                     Accession #                Episode #  1  [id number]                   [id number]                 [id number] ---------------------------------------------------------------------- Indications  Insufficient Prenatal Care                     O09.30  [redacted] weeks gestation of pregnancy                Z3A.25  Vaginal bleeding in pregnancy, second          O46.92  trimester   Encounter for antenatal screening for          Z36.3  malformations  Obesity complicating pregnancy, second         O99.212  trimester ---------------------------------------------------------------------- Fetal Evaluation  Num Of Fetuses:         1  Fetal Heart Rate(bpm):  152  Cardiac Activity:  Observed  Presentation:           Breech  Placenta:               Anterior  P. Cord Insertion:      Not well visualized  Amniotic Fluid  AFI FV:      Within normal limits                              Largest Pocket(cm)                              4.4 ---------------------------------------------------------------------- Biometry  BPD:      60.1  mm     G. Age:  24w 4d         20  %    CI:        74.33   %    70 - 86                                                          FL/HC:      20.5   %    18.7 - 20.3  HC:      221.3  mm     G. Age:  24w 1d          6  %    HC/AC:      0.99        1.04 - 1.22  AC:      223.4  mm     G. Age:  26w 5d         86  %    FL/BPD:     75.4   %    71 - 87  FL:       45.3  mm     G. Age:  25w 0d         31  %    FL/AC:      20.3   %    20 - 24  HUM:      41.9  mm     G. Age:  25w 2d         46  %  CER:      27.1  mm     G. Age:  24w 2d         30  %  Est. FW:     847  gm    1 lb 14 oz      67  % ---------------------------------------------------------------------- OB History  Gravidity:    11        Term:   4        Prem:   1        SAB:   5  Living:       5 ---------------------------------------------------------------------- Gestational Age  U/S Today:     25w 1d                                        EDD:   11/14/20  Best:          25w 1d  Det. By:  Marcella Dubs         EDD:   11/14/20                                      (03/22/20) ---------------------------------------------------------------------- Anatomy  Cranium:               Appears normal         LVOT:                   Not well visualized  Cavum:                 Appears normal         Aortic Arch:            Not  well visualized  Ventricles:            Appears normal         Ductal Arch:            Not well visualized  Choroid Plexus:        Appears normal         Diaphragm:              Appears normal  Cerebellum:            Appears normal         Stomach:                Appears normal, left                                                                        sided  Posterior Fossa:       Appears normal         Abdomen:                Appears normal  Nuchal Fold:           Not applicable (>20    Abdominal Wall:         Appears nml (cord                         wks GA)                                        insert, abd wall)  Face:                  Appears normal         Cord Vessels:           Appears normal (3                         (orbits and profile)                           vessel cord)  Lips:                  Appears normal  Kidneys:                Appear normal  Palate:                Not well visualized    Bladder:                Appears normal  Thoracic:              Appears normal         Spine:                  Not well visualized  Heart:                 Appears normal         Upper Extremities:      Appears normal                         (4CH, axis, and                         situs)  RVOT:                  Not well visualized    Lower Extremities:      Appears normal  Other:  Fetus appears to be female. Nasal bone visualized. Hands and feet          not well visualized. Technically difficult due to maternal habitus and          fetal position. ---------------------------------------------------------------------- Cervix Uterus Adnexa  Cervix  Not visualized (advanced GA >24wks)  Uterus  No abnormality visualized.  Right Ovary  No adnexal mass visualized.  Left Ovary  No adnexal mass visualized.  Cul De Sac  No free fluid seen.  Adnexa  No abnormality visualized. ---------------------------------------------------------------------- Impression  Patient is being evaluated at the MAU for complaints of   vaginal discharge and fatigue.  On 03/22/2020, she had  transvaginal ultrasound that confirmed an intrauterine  pregnancy consistent with 6 weeks 1 day gestation.  On today's ultrasound, fetal biometry is consistent with her  previous ultrasound established dates.  Amniotic fluid is  normal and good fetal activity seen.  No markers of  aneuploidies or fetal structural defects are seen.  Fetal  anatomical survey appears normal but incomplete because of  fetal position.  I recommend assigning her EDD at 0 11/14/2020. ---------------------------------------------------------------------- Recommendations  -Follow-up scan in 4 weeks to complete anatomical survey.  -Patient to initiate prenatal care. ----------------------------------------------------------------------                  Noralee Space, MD Electronically Signed Final Report   08/02/2020 04:16 pm ----------------------------------------------------------------------   MDM Physical Exam Ultrasound Labs: UA, UPT, CBC Assessment and Plan  34 year old, [id number]  Unknown GA Abdominal Pain Vaginal Bleeding Leg Pain Psychosis  -Reviewed POC with patient and SO. -Will collect labs and send for abdominal US.  -Upon return will place in room and perform physical exam including pelvic evaluation. -SO and patient agreeable with POC. -Will await results.  -Patient with continued inappropriate comments related to religion and delusions that someone is/will hurt her. *She states to phlebotomist: "I give you permission to draw my blood, but I do not give you permission to hurt or harm me."  Cherre Robins 08/02/2020, 3:03 PM   Reassessment (4:53 PM) SIUP at 25.3 weeks  -[place] results return as above. -Patient  received detailed [place] d/t GA.  -Results reflective of previous 6 week [place]. -Attempted to monitor patient, but patient declines extended monitoring.  Patient moving straps when provider readjusts. -Patient declines pelvic exam stating "I am a  woman and I don't want nobody all in my coochie." -Discussed starting PNC at previous provider [facility]-Patient and SO agreeable. Message Sent. -During a time of lucidity patient admits to feeling depressed.  Agreeable to referral to [facility].  Message Sent. -Will send script for prenatal vitamins.   -Encouraged to call or return to MAU if symptoms worsen or with the onset of new symptoms. -Discharged to home in stable condition.  Cherre Robins MSN, CNM Advanced Practice Provider, [facility] [employer]

## 2020-08-02 NOTE — Discharge Instructions (Signed)

## 2020-08-10 ENCOUNTER — Telehealth: Payer: Self-pay | Admitting: Physician Assistant

## 2020-08-10 NOTE — Telephone Encounter (Signed)
° °  The patient's fiance Ivin Booty called the answering service after-hours today. He states they saw Dr. Shari Prows last month for chest pain and recommended labs due to abnormal hCG. Please see very detailed, complex note from 10/6 whch also  outliens for concern for psychiatric issues. Per notes, the cardiac workup was contingent on the OBGYN status which the patient originally did not pursue. She was however eventually seen in MAU 11/2. US showed singleton intrauterine pregnancy consistent with 6 weeks 1 day gestation. Psychosis was also noted during that evaluation as well. Her fiance called this evening stating that since they finally were seen [facility], they had not heard anything back yet about next steps so would like a call back from our office with input from her cardiologist. I advised I would route to Dr. Shari Prows for review.  Laurann Montana, PA-C

## 2020-08-11 ENCOUNTER — Telehealth: Payer: Self-pay | Admitting: Cardiology

## 2020-08-11 NOTE — Telephone Encounter (Signed)
I placed call to patient and left message to call office 

## 2020-08-11 NOTE — Telephone Encounter (Signed)
I am glad the patient was able to follow-up with OBGYN to confirm her pregnancy.  Unfortunately, during our last visit it was difficult to discern the nature of her chest pain/cardiac complaints as the patient was very upset by the invasive thoughts she was having. At this time, I believe the priority should be seeking psychiatric help in order to ensure the safety and well-being of the patient and her fetus.   I am happy to see her in follow-up if she is continuing to have chest pain/cardiac symptoms but again, the goal is to have her evaluated by psychiatry and establish regular follow-up with OBGYN to support her throughout her pregnancy.  Laurance Flatten, MD

## 2020-08-14 ENCOUNTER — Other Ambulatory Visit: Payer: Self-pay

## 2020-08-14 ENCOUNTER — Inpatient Hospital Stay (HOSPITAL_COMMUNITY)
Admission: AD | Admit: 2020-08-14 | Discharge: 2020-08-14 | Disposition: A | Discharge status: Home / Self Care | Payer: Medicaid Other | Attending: Obstetrics & Gynecology | Admitting: Obstetrics & Gynecology

## 2020-08-14 ENCOUNTER — Encounter (HOSPITAL_COMMUNITY): Payer: Self-pay | Admitting: Obstetrics & Gynecology

## 2020-08-14 DIAGNOSIS — O99342 Other mental disorders complicating pregnancy, second trimester: Secondary | ICD-10-CM

## 2020-08-14 DIAGNOSIS — F22 Delusional disorders: Secondary | ICD-10-CM | POA: Diagnosis not present

## 2020-08-14 DIAGNOSIS — R1110 Vomiting, unspecified: Secondary | ICD-10-CM

## 2020-08-14 DIAGNOSIS — O98312 Other infections with a predominantly sexual mode of transmission complicating pregnancy, second trimester: Secondary | ICD-10-CM | POA: Diagnosis not present

## 2020-08-14 DIAGNOSIS — Z3A26 26 weeks gestation of pregnancy: Secondary | ICD-10-CM | POA: Diagnosis not present

## 2020-08-14 DIAGNOSIS — A6000 Herpesviral infection of urogenital system, unspecified: Secondary | ICD-10-CM | POA: Diagnosis not present

## 2020-08-14 DIAGNOSIS — O211 Hyperemesis gravidarum with metabolic disturbance: Secondary | ICD-10-CM | POA: Diagnosis not present

## 2020-08-14 DIAGNOSIS — E876 Hypokalemia: Secondary | ICD-10-CM

## 2020-08-14 DIAGNOSIS — Z7722 Contact with and (suspected) exposure to environmental tobacco smoke (acute) (chronic): Secondary | ICD-10-CM | POA: Insufficient documentation

## 2020-08-14 DIAGNOSIS — A6009 Herpesviral infection of other urogenital tract: Secondary | ICD-10-CM

## 2020-08-14 DIAGNOSIS — O212 Late vomiting of pregnancy: Secondary | ICD-10-CM | POA: Diagnosis present

## 2020-08-14 LAB — CBC WITH DIFFERENTIAL/PLATELET
Abs Immature Granulocytes: 0.03 10*3/uL (ref 0.00–0.07)
Basophils Absolute: 0 10*3/uL (ref 0.0–0.1)
Basophils Relative: 0 %
Eosinophils Absolute: 0 10*3/uL (ref 0.0–0.5)
Eosinophils Relative: 0 %
HCT: 34.1 % — ABNORMAL LOW (ref 36.0–46.0)
Hemoglobin: 10.6 g/dL — ABNORMAL LOW (ref 12.0–15.0)
Immature Granulocytes: 0 %
Lymphocytes Relative: 32 %
Lymphs Abs: 2.8 10*3/uL (ref 0.7–4.0)
MCH: 23.4 pg — ABNORMAL LOW (ref 26.0–34.0)
MCHC: 31.1 g/dL (ref 30.0–36.0)
MCV: 75.3 fL — ABNORMAL LOW (ref 80.0–100.0)
Monocytes Absolute: 0.7 10*3/uL (ref 0.1–1.0)
Monocytes Relative: 8 %
Neutro Abs: 5.2 10*3/uL (ref 1.7–7.7)
Neutrophils Relative %: 60 %
Platelets: 269 10*3/uL (ref 150–400)
RBC: 4.53 MIL/uL (ref 3.87–5.11)
RDW: 15.1 % (ref 11.5–15.5)
WBC: 8.8 10*3/uL (ref 4.0–10.5)
nRBC: 0 % (ref 0.0–0.2)

## 2020-08-14 LAB — COMPREHENSIVE METABOLIC PANEL
ALT: 9 U/L (ref 0–44)
AST: 11 U/L — ABNORMAL LOW (ref 15–41)
Albumin: 3 g/dL — ABNORMAL LOW (ref 3.5–5.0)
Alkaline Phosphatase: 53 U/L (ref 38–126)
Anion gap: 12 (ref 5–15)
BUN: 6 mg/dL (ref 6–20)
CO2: 21 mmol/L — ABNORMAL LOW (ref 22–32)
Calcium: 9 mg/dL (ref 8.9–10.3)
Chloride: 103 mmol/L (ref 98–111)
Creatinine, Ser: 0.57 mg/dL (ref 0.44–1.00)
GFR, Estimated: >60 mL/min (ref >60)
Glucose, Bld: 82 mg/dL (ref 70–99)
Potassium: 3.2 mmol/L — ABNORMAL LOW (ref 3.5–5.1)
Sodium: 136 mmol/L (ref 135–145)
Total Bilirubin: 0.6 mg/dL (ref 0.3–1.2)
Total Protein: 7.3 g/dL (ref 6.5–8.1)

## 2020-08-14 LAB — URINALYSIS, ROUTINE W REFLEX MICROSCOPIC
Bilirubin Urine: NEGATIVE
Glucose, UA: NEGATIVE mg/dL
Hgb urine dipstick: NEGATIVE
Ketones, ur: NEGATIVE mg/dL
Leukocytes,Ua: NEGATIVE
Nitrite: NEGATIVE
Protein, ur: 30 mg/dL — AB
Specific Gravity, Urine: 1.029 (ref 1.005–1.030)
pH: 5 (ref 5.0–8.0)

## 2020-08-14 LAB — RAPID URINE DRUG SCREEN, HOSP PERFORMED
Amphetamines: NOT DETECTED
Barbiturates: NOT DETECTED
Benzodiazepines: NOT DETECTED
Cocaine: NOT DETECTED
Opiates: NOT DETECTED
Tetrahydrocannabinol: NOT DETECTED

## 2020-08-14 LAB — TSH: TSH: 1.326 u[IU]/mL (ref 0.350–4.500)

## 2020-08-14 LAB — LIPASE, BLOOD: Lipase: 25 U/L (ref 11–51)

## 2020-08-14 MED ORDER — LACTATED RINGERS IV BOLUS
1000.0000 mL | Freq: Once | INTRAVENOUS | Status: AC
  Administered 2020-08-14: 1000 mL via INTRAVENOUS

## 2020-08-14 MED ORDER — PROMETHAZINE HCL 25 MG PO TABS: 25.0000 mg | ORAL_TABLET | Freq: Four times a day (QID) | ORAL | 1 refills | Status: DC | PRN

## 2020-08-14 MED ORDER — FAMOTIDINE IN NACL 20-0.9 MG/50ML-% IV SOLN
20.0000 mg | Freq: Once | INTRAVENOUS | Status: AC
  Administered 2020-08-14: 20 mg via INTRAVENOUS
  Filled 2020-08-14: qty 50

## 2020-08-14 MED ORDER — POTASSIUM CHLORIDE CRYS ER 20 MEQ PO TBCR
40.0000 meq | EXTENDED_RELEASE_TABLET | Freq: Once | ORAL | Status: DC
  Filled 2020-08-14: qty 2

## 2020-08-14 MED ORDER — FAMOTIDINE 20 MG PO TABS: 20.0000 mg | ORAL_TABLET | Freq: Two times a day (BID) | ORAL | 1 refills | Status: DC

## 2020-08-14 MED ORDER — PROMETHAZINE HCL 25 MG/ML IJ SOLN
25.0000 mg | Freq: Once | INTRAMUSCULAR | Status: AC
  Administered 2020-08-14: 25 mg via INTRAVENOUS
  Filled 2020-08-14: qty 1

## 2020-08-14 NOTE — Discharge Instructions (Signed)
Hyperemesis Gravidarum Hyperemesis gravidarum is a severe form of nausea and vomiting that happens during pregnancy. Hyperemesis is worse than morning sickness. It may cause you to have nausea or vomiting all day for many days. It may keep you from eating and drinking enough food and liquids, which can lead to dehydration, malnutrition, and weight loss. Hyperemesis usually occurs during the first half (the first 20 weeks) of pregnancy. It often goes away once a woman is in her second half of pregnancy. However, sometimes hyperemesis continues through an entire pregnancy. What are the causes? The cause of this condition is not known. It may be related to changes in chemicals (hormones) in the body during pregnancy, such as the high level of pregnancy hormone (human chorionic gonadotropin) or the increase in the female sex hormone (estrogen). What are the signs or symptoms? Symptoms of this condition include:  Nausea that does not go away.  Vomiting that does not allow you to keep any food down.  Weight loss.  Body fluid loss (dehydration).  Having no desire to eat, or not liking food that you have previously enjoyed. How is this diagnosed? This condition may be diagnosed based on:  A physical exam.  Your medical history.  Your symptoms.  Blood tests.  Urine tests. How is this treated? This condition is managed by controlling symptoms. This may include:  Following an eating plan. This can help lessen nausea and vomiting.  Taking prescription medicines. An eating plan and medicines are often used together to help control symptoms. If medicines do not help relieve nausea and vomiting, you may need to receive fluids through an IV at the hospital. Follow these instructions at home: Eating and drinking   Avoid the following: ? Drinking fluids with meals. Try not to drink anything during the 30 minutes before and after your meals. ? Drinking more than 1 cup of fluid at a  time. ? Eating foods that trigger your symptoms. These may include spicy foods, coffee, high-fat foods, very sweet foods, and acidic foods. ? Skipping meals. Nausea can be more intense on an empty stomach. If you cannot tolerate food, do not force it. Try sucking on ice chips or other frozen items and make up for missed calories later. ? Lying down within 2 hours after eating. ? Being exposed to environmental triggers. These may include food smells, smoky rooms, closed spaces, rooms with strong smells, warm or humid places, overly loud and noisy rooms, and rooms with motion or flickering lights. Try eating meals in a well-ventilated area that is free of strong smells. ? Quick and sudden changes in your movement. ? Taking iron pills and multivitamins that contain iron. If you take prescription iron pills, do not stop taking them unless your health care provider approves. ? Preparing food. The smell of food can spoil your appetite or trigger nausea.  To help relieve your symptoms: ? Listen to your body. Everyone is different and has different preferences. Find what works best for you. ? Eat and drink slowly. ? Eat 5-6 small meals daily instead of 3 large meals. Eating small meals and snacks can help you avoid an empty stomach. ? In the morning, before getting out of bed, eat a couple of crackers to avoid moving around on an empty stomach. ? Try eating starchy foods as these are usually tolerated well. Examples include cereal, toast, bread, potatoes, pasta, rice, and pretzels. ? Include at least 1 serving of protein with your meals and snacks. Protein options include   lean meats, poultry, seafood, beans, nuts, nut butters, eggs, cheese, and yogurt. ? Try eating a protein-rich snack before bed. Examples of a protein-rick snack include cheese and crackers or a peanut butter sandwich made with 1 slice of whole-wheat bread and 1 tsp (5 g) of peanut butter. ? Eat or suck on things that have ginger in them.  It may help relieve nausea. Add  tsp ground ginger to hot tea or choose ginger tea. ? Try drinking 100% fruit juice or an electrolyte drink. An electrolyte drink contains sodium, potassium, and chloride. ? Drink fluids that are cold, clear, and carbonated or sour. Examples include lemonade, ginger ale, lemon-lime soda, ice water, and sparkling water. ? Brush your teeth or use a mouth rinse after meals. ? Talk with your health care provider about starting a supplement of vitamin B6. General instructions  Take over-the-counter and prescription medicines only as told by your health care provider.  Follow instructions from your health care provider about eating or drinking restrictions.  Continue to take your prenatal vitamins as told by your health care provider. If you are having trouble taking your prenatal vitamins, talk with your health care provider about different options.  Keep all follow-up and pre-birth (prenatal) visits as told by your health care provider. This is important. Contact a health care provider if:  You have pain in your abdomen.  You have a severe headache.  You have vision problems.  You are losing weight.  You feel weak or dizzy. Get help right away if:  You cannot drink fluids without vomiting.  You vomit blood.  You have constant nausea and vomiting.  You are very weak.  You faint.  You have a fever and your symptoms suddenly get worse. Summary  Hyperemesis gravidarum is a severe form of nausea and vomiting that happens during pregnancy.  Making some changes to your eating habits may help relieve nausea and vomiting.  This condition may be managed with medicine.  If medicines do not help relieve nausea and vomiting, you may need to receive fluids through an IV at the hospital. This information is not intended to replace advice given to you by your health care provider. Make sure you discuss any questions you have with your health care  provider. Document Revised: 10/07/2017 Document Reviewed: 05/16/2016 Elsevier Patient Education  2020 [employer]  

## 2020-08-14 NOTE — MAU Provider Note (Signed)
History     CSN: [id number]  Arrival date and time: 08/14/20 1244   First Provider Initiated Contact with Patient 08/14/20 1341      Chief Complaint  Patient presents with  . Abdominal Pain  . Nausea  . Emesis   HPI   Melinda Jimenez is a 34 y.o. female [id number] @[redacted]w[redacted]d  here in MAU with nausea and vomiting. Her partner is here today and reports worsening of symptoms over the last month. She reports having some nausea in the begging with vomiting here and there; now it is every day. She reports all over abdominal pain with vomiting. She is not taking anything for the nausea. States she is able to keep down some fluids, however it is inconsistent.   No bleeding, + fetal movement.   Patient Reports >40 lb weight loss   OB History    Gravida  10   Para  4   Term  3   Preterm  1   AB  5   Living  3     SAB  2   TAB  3   Ectopic      Multiple  0   Live Births  3           Past Medical History:  Diagnosis Date  . Anemia   . Anxiety    off meds with preg  . Carpal tunnel syndrome   . Chlamydia   . Genital HSV    Last OB: > 85yr ago; Valtrex 500 mg po prn outbreaks  . GERD (gastroesophageal reflux disease)   . Gestational diabetes    diet controlled  . Headache(784.0)   . IBS (irritable bowel syndrome)   . Infection    UTI  . Mood swings   . Panic attack   . Thyroid disease   . Trichomonas     Past Surgical History:  Procedure Laterality Date  . CESAREAN SECTION N/A 05/20/2013   Procedure: CESAREAN SECTION;  Surgeon: 05/22/2013, MD;  Location: [facility];  Service: Obstetrics;  Laterality: N/A;  . CESAREAN SECTION N/A 04/06/2017   Procedure: CESAREAN SECTION;  Surgeon: 06/07/2017, MD;  Location: [facility] BIRTHING SUIT[facility];  Service: Obstetrics;  Laterality: N/A;  . INDUCED ABORTION  2009  . VAGINAL DELIVERY  2007    Family History  Problem Relation Age of Onset  . Depression Mother   . Anxiety disorder Mother   . Breast cancer Other   .  Bipolar disorder Brother   . Diabetes Maternal Grandmother   . Cancer Maternal Grandmother   . Hypertension Maternal Grandmother   . Hypertension Paternal Grandmother   . Autism Other   . Schizophrenia Other     Social History   Tobacco Use  . Smoking status: Passive Smoke Exposure - Never Smoker  . Smokeless tobacco: Never Used  . Tobacco comment: was not a daily smoker, tried it  Vaping Use  . Vaping Use: Never used  Substance Use Topics  . Alcohol use: No    Comment: Twice a month  . Drug use: No    Allergies:  Allergies  Allergen Reactions  . Zofran [Ondansetron Hcl] Nausea And Vomiting  . Latex Itching    Medications Prior to Admission  Medication Sig Dispense Refill Last Dose  . Prenatal Vit-Fe Fumarate-FA (PREPLUS) 27-1 MG TABS Take 1 tablet by mouth daily. 30 tablet 13    Results for orders placed or performed during the hospital encounter of 08/14/20 (from the  past 48 hour(s))  Urinalysis, Routine w reflex microscopic Urine, Clean Catch     Status: Abnormal   Collection Time: 08/14/20 12:58 PM  Result Value Ref Range   Color, Urine YELLOW YELLOW   APPearance CLOUDY (A) CLEAR   Specific Gravity, Urine 1.029 1.005 - 1.030   pH 5.0 5.0 - 8.0   Glucose, UA NEGATIVE NEGATIVE mg/dL   Hgb urine dipstick NEGATIVE NEGATIVE   Bilirubin Urine NEGATIVE NEGATIVE   Ketones, ur NEGATIVE NEGATIVE mg/dL   Protein, ur 30 (A) NEGATIVE mg/dL   Nitrite NEGATIVE NEGATIVE   Leukocytes,Ua NEGATIVE NEGATIVE   RBC / HPF 0-5 0 - 5 RBC/hpf   WBC, UA 6-10 0 - 5 WBC/hpf   Bacteria, UA RARE (A) NONE SEEN   Squamous Epithelial / LPF 11-20 0 - 5   Mucus PRESENT     Comment: Performed at [facility], [address], [place], [place] [address]  Comprehensive metabolic panel     Status: Abnormal   Collection Time: 08/14/20  1:48 PM  Result Value Ref Range   Sodium 136 135 - 145 mmol/L   Potassium 3.2 (L) 3.5 - 5.1 mmol/L   Chloride 103 98 - 111 mmol/L   CO2 21 (L) 22 - 32  mmol/L   Glucose, Bld 82 70 - 99 mg/dL    Comment: Glucose reference range applies only to samples taken after fasting for at least 8 hours.   BUN 6 6 - 20 mg/dL   Creatinine, Ser 9.50 0.44 - 1.00 mg/dL   Calcium 9.0 8.9 - 93.2 mg/dL   Total Protein 7.3 6.5 - 8.1 g/dL   Albumin 3.0 (L) 3.5 - 5.0 g/dL   AST 11 (L) 15 - 41 U/L   ALT 9 0 - 44 U/L   Alkaline Phosphatase 53 38 - 126 U/L   Total Bilirubin 0.6 0.3 - 1.2 mg/dL   GFR, Estimated >67 >12 mL/min    Comment: (NOTE) Calculated using the CKD-EPI Creatinine Equation (2021)    Anion gap 12 5 - 15    Comment: Performed at [facility], [address], [place], [place] [address]  Lipase, blood     Status: None   Collection Time: 08/14/20  1:48 PM  Result Value Ref Range   Lipase 25 11 - 51 U/L    Comment: Performed at [facility], [address] [place], [place] [address]  CBC with Differential/Platelet     Status: Abnormal   Collection Time: 08/14/20  1:48 PM  Result Value Ref Range   WBC 8.8 4.0 - 10.5 K/uL   RBC 4.53 3.87 - 5.11 MIL/uL   Hemoglobin 10.6 (L) 12.0 - 15.0 g/dL   HCT 25.0 (L) 36 - 46 %   MCV 75.3 (L) 80.0 - 100.0 fL   MCH 23.4 (L) 26.0 - 34.0 pg   MCHC 31.1 30.0 - 36.0 g/dL   RDW 53.9 76.7 - 34.1 %   Platelets 269 150 - 400 K/uL   nRBC 0.0 0.0 - 0.2 %   Neutrophils Relative % 60 %   Neutro Abs 5.2 1.7 - 7.7 K/uL   Lymphocytes Relative 32 %   Lymphs Abs 2.8 0.7 - 4.0 K/uL   Monocytes Relative 8 %   Monocytes Absolute 0.7 0.1 - 1.0 K/uL   Eosinophils Relative 0 %   Eosinophils Absolute 0.0 0.0 - 0.5 K/uL   Basophils Relative 0 %   Basophils Absolute 0.0 0.0 - 0.1  K/uL   Immature Granulocytes 0 %   Abs Immature Granulocytes 0.03 0.00 - 0.07 K/uL    Comment: Performed at [facility], [address] [place], [place] [address]  TSH     Status: None   Collection Time: 08/14/20  1:49 PM  Result Value Ref Range   TSH 1.326 0.350 - 4.500 uIU/mL    Comment: Performed by a 3rd Generation  assay with a functional sensitivity of <=0.01 uIU/mL. Performed at [facility], [address] [place], [place] [address]     Review of Systems Physical Exam   Blood pressure 113/75, pulse 96, temperature 98.4 F (36.9 C), temperature source Oral, resp. rate 16, height 5\' 7"  (1.702 m), weight 93.8 kg, SpO2 98 %, unknown if currently breastfeeding.  Physical Exam Vitals and nursing note reviewed.  Constitutional:      General: She is not in acute distress.    Appearance: She is well-developed. She is obese. She is ill-appearing. She is not toxic-appearing or diaphoretic.  HENT:     Head: Normocephalic.  Eyes:     Pupils: Pupils are equal, round, and reactive to light.  Pulmonary:     Breath sounds: Normal breath sounds.  Abdominal:     Tenderness: There is generalized abdominal tenderness. There is no guarding or rebound.  Neurological:     Mental Status: She is alert and oriented to person, place, and time.  Psychiatric:        Mood and Affect: Mood is depressed. Mood is not anxious. Affect is angry and inappropriate.        Speech: Speech is delayed.        Behavior: Behavior is cooperative.        Thought Content: Thought content is delusional. Thought content does not include suicidal plan.    Fetal Tracing: Baseline: 150 bpm Variability: Moderate  Accelerations: 15x15 Decelerations: None  Toco: None.   MAU Course  Procedures  None  MDM  Patient repeatedly talking about the devil while provider in the room LR bolus x 1 Phenergan 25 mg IV and Pepcid 20 mg given IV Patient tolerating panera bread sandwich  TSH and Lipase normal  Kdur 40 meq given PO  Assessment and Plan   A:  1. Hypokalemia   2. Hyperemesis   3. Delusional disorder (HCC)   4. [redacted] weeks gestation of pregnancy   5. Herpes genitalis in women     P:  Discharge home in stable condition Bland diet Rx: Phenergan- ok to use in the vagina. Pepcid Return to MAU if symptoms  worsen Small, frequent meals.  , NP 08/14/2020 4:11 PM

## 2020-08-14 NOTE — MAU Note (Signed)
Melinda Jimenez is a 34 y.o. at [redacted]w[redacted]d here in MAU reporting: vomiting for weeks. Unable to recall how many episodes in the past 24 hours. States she is also constipated, had a BM today but it was small. States she is also having an itchy discharge for the past 2 weeks. Having some intermittent abdominal pain. Had some vaginal bleeding yesterday but none since then, states she saw it when she wiped and a little bit in her underwear. Unable to verbalize FM and if movement is normal or abnormal.   Onset of complaint: ongoing  Pain score: unable to verbalize a pain score. 2/10 FACES  Vitals:   08/14/20 1259  BP: 113/75  Pulse: 96  Resp: 16  Temp: 98.4 F (36.9 C)  SpO2: 98%     FHT: 145  Lab orders placed from triage: UA

## 2020-08-17 NOTE — Telephone Encounter (Signed)
Spoke with pt and advised of Dr Devin Going message as below dated 08/11/2020.  Pt in angry tone asks what chest pain has to do with being pregnant.  Pt states "it doesn't matter anyway, I am having an abortion."  Pt states her family wants to know why she was seen in our office and next states her Aunt wants to be on the line and she will have to call back and disconnected the call.  Will forward information to Dr Shari Prows to make aware.

## 2020-08-23 ENCOUNTER — Encounter: Payer: Medicaid Other | Admitting: Obstetrics & Gynecology

## 2020-08-23 ENCOUNTER — Other Ambulatory Visit: Payer: Medicaid Other

## 2020-09-12 ENCOUNTER — Inpatient Hospital Stay (HOSPITAL_COMMUNITY)
Admission: AD | Admit: 2020-09-12 | Discharge: 2020-09-13 | Disposition: A | Discharge status: Home / Self Care | Payer: Medicaid Other | Attending: Obstetrics & Gynecology | Admitting: Obstetrics & Gynecology

## 2020-09-12 ENCOUNTER — Other Ambulatory Visit: Payer: Self-pay

## 2020-09-12 ENCOUNTER — Encounter (HOSPITAL_COMMUNITY): Payer: Self-pay | Admitting: Obstetrics & Gynecology

## 2020-09-12 DIAGNOSIS — O98513 Other viral diseases complicating pregnancy, third trimester: Secondary | ICD-10-CM | POA: Diagnosis not present

## 2020-09-12 DIAGNOSIS — U071 COVID-19: Secondary | ICD-10-CM | POA: Diagnosis not present

## 2020-09-12 DIAGNOSIS — Z7722 Contact with and (suspected) exposure to environmental tobacco smoke (acute) (chronic): Secondary | ICD-10-CM | POA: Insufficient documentation

## 2020-09-12 DIAGNOSIS — Z3A31 31 weeks gestation of pregnancy: Secondary | ICD-10-CM

## 2020-09-12 DIAGNOSIS — O212 Late vomiting of pregnancy: Secondary | ICD-10-CM | POA: Insufficient documentation

## 2020-09-12 DIAGNOSIS — O219 Vomiting of pregnancy, unspecified: Secondary | ICD-10-CM | POA: Diagnosis not present

## 2020-09-12 HISTORY — DX: COVID-19: U07.1

## 2020-09-12 LAB — URINALYSIS, ROUTINE W REFLEX MICROSCOPIC
Bilirubin Urine: NEGATIVE
Glucose, UA: NEGATIVE mg/dL
Hgb urine dipstick: NEGATIVE
Ketones, ur: NEGATIVE mg/dL
Leukocytes,Ua: NEGATIVE
Nitrite: NEGATIVE
Protein, ur: NEGATIVE mg/dL
Specific Gravity, Urine: 1.013 (ref 1.005–1.030)
pH: 6 (ref 5.0–8.0)

## 2020-09-12 LAB — RESP PANEL BY RT-PCR (FLU A&B, COVID) ARPGX2
Influenza A by PCR: NEGATIVE
Influenza B by PCR: NEGATIVE
SARS Coronavirus 2 by RT PCR: POSITIVE — AB

## 2020-09-12 MED ORDER — M.V.I. ADULT IV INJ
Freq: Once | INTRAVENOUS | Status: AC
  Filled 2020-09-12: qty 1000

## 2020-09-12 MED ORDER — SODIUM CHLORIDE 0.9 % IV SOLN
25.0000 mg | Freq: Once | INTRAVENOUS | Status: AC
  Administered 2020-09-12: 22:00:00 25 mg via INTRAVENOUS
  Filled 2020-09-12: qty 1

## 2020-09-12 NOTE — MAU Provider Note (Signed)
History     CSN: [id number]  Arrival date and time: 09/12/20 1951   Event Date/Time   First Provider Initiated Contact with Patient 09/12/20 2048      Chief Complaint  Patient presents with  . Vomiting  . Generalized Body Aches   [name] is a 34 y.o. year old [id number] female at [redacted]w[redacted]d weeks gestation who presents to MAU reporting unable to keep anything down today. She complains of her legs are sore & hurting " a lot" from the top of her legs down to her knees then to the bottom of her feet x 2 days. She reports being hot/cold all day. (+) FM. She denies any sick contacts. She is scheduled to start [facility] at [facility] on 09/26/2020.   OB History    Gravida  10   Para  4   Term  3   Preterm  1   AB  5   Living  3     SAB  2   IAB  3   Ectopic      Multiple  0   Live Births  3           Past Medical History:  Diagnosis Date  . Anemia   . Anxiety    off meds with preg  . Carpal tunnel syndrome   . Chlamydia   . 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 GERD (gastroesophageal reflux disease)   . Gestational diabetes    diet controlled  . Headache(784.0)   . IBS (irritable bowel syndrome)   . Infection    UTI  . Mood swings   . Panic attack   . Thyroid disease   . Trichomonas     Past Surgical History:  Procedure Laterality Date  . CESAREAN SECTION N/A 05/20/2013   Procedure: CESAREAN SECTION;  Surgeon: Brock Bad, MD;  Location: [facility];  Service: Obstetrics;  Laterality: N/A;  . CESAREAN SECTION N/A 04/06/2017   Procedure: CESAREAN SECTION;  Surgeon: Hermina Staggers, MD;  Location: [facility] BIRTHING SUITES;  Service: Obstetrics;  Laterality: N/A;  . INDUCED ABORTION  2009  . VAGINAL DELIVERY  2007    Family History  Problem Relation Age of Onset  . Depression Mother   . Anxiety disorder Mother   . Breast cancer Other   . Bipolar disorder Brother   . Diabetes Maternal Grandmother   . Cancer Maternal  Grandmother   . Hypertension Maternal Grandmother   . Hypertension Paternal Grandmother   . Autism Other   . Schizophrenia Other     Social History   Tobacco Use  . Smoking status: Passive Smoke Exposure - Never Smoker  . Smokeless tobacco: Never Used  . Tobacco comment: was not a daily smoker, tried it  Vaping Use  . Vaping Use: Never used  Substance Use Topics  . Alcohol use: No    Comment: Twice a month  . Drug use: No    Allergies:  Allergies  Allergen Reactions  . Zofran [Ondansetron Hcl] Nausea And Vomiting  . Latex Itching    Medications Prior to Admission  Medication Sig Dispense Refill Last Dose  . Chlorphen-Pseudoephed-APAP (THERAFLU FLU/COLD PO) Take by mouth.   09/12/2020 at Unknown time  . guaifenesin (ROBITUSSIN) 100 MG/5ML syrup Take 200 mg by mouth 3 (three) times daily as needed for cough.   09/12/2020 at 0900  . promethazine (PHENERGAN) 25 MG tablet  Take 1 tablet (25 mg total) by mouth every 6 (six) hours as needed for nausea or vomiting. 30 tablet 1 09/12/2020 at Unknown time  . famotidine (PEPCID) 20 MG tablet Take 1 tablet (20 mg total) by mouth 2 (two) times daily. 60 tablet 1   . Prenatal Vit-Fe Fumarate-FA (PREPLUS) 27-1 MG TABS Take 1 tablet by mouth daily. 30 tablet 13   . pseudoephedrine-acetaminophen (TYLENOL SINUS) 30-500 MG TABS tablet Take 1 tablet by mouth every 4 (four) hours as needed.       Review of Systems  Constitutional: Positive for appetite change, chills and fatigue (:just not feeling good").  HENT: Negative.   Eyes: Negative.   Respiratory: Negative.  Negative for shortness of breath.   Cardiovascular: Negative.   Gastrointestinal: Positive for nausea and vomiting.  Endocrine: Negative.   Genitourinary:       (+) FM  Musculoskeletal: Positive for myalgias (leg pain all the way to bottoms of feet for 2 days).  Allergic/Immunologic: Negative.   Neurological: Positive for weakness.  Psychiatric/Behavioral: Negative.     Physical Exam   Blood pressure 128/70, pulse (!) 114, temperature 99.5 F (37.5 C), temperature source Oral, resp. rate 20, height 5\' 7"  (1.702 m), weight 91.6 kg, SpO2 98 %, unknown if currently breastfeeding.  Physical Exam Vitals and nursing note reviewed.  Constitutional:      Appearance: She is obese. She is ill-appearing.  HENT:     Head: Normocephalic.  Cardiovascular:     Rate and Rhythm: Tachycardia present.  Pulmonary:     Effort: Pulmonary effort is normal.  Abdominal:     Palpations: Abdomen is soft.  Musculoskeletal:        General: Tenderness (bilaterally to palpation) present. Normal range of motion.  Skin:    General: Skin is warm and dry.  Neurological:     Mental Status: She is alert and oriented to person, place, and time.  Psychiatric:        Mood and Affect: Mood normal.        Behavior: Behavior normal.        Thought Content: Thought content normal.        Judgment: Judgment normal.     MAU Course  Procedures  MDM CCUA COVID test  Results for orders placed or performed during the hospital encounter of 09/12/20 (from the past 72 hour(s))  Urinalysis, Routine w reflex microscopic Urine, Clean Catch     Status: None   Collection Time: 09/12/20  7:54 PM  Result Value Ref Range   Color, Urine YELLOW YELLOW   APPearance CLEAR CLEAR   Specific Gravity, Urine 1.013 1.005 - 1.030   pH 6.0 5.0 - 8.0   Glucose, UA NEGATIVE NEGATIVE mg/dL   Hgb urine dipstick NEGATIVE NEGATIVE   Bilirubin Urine NEGATIVE NEGATIVE   Ketones, ur NEGATIVE NEGATIVE mg/dL   Protein, ur NEGATIVE NEGATIVE mg/dL   Nitrite NEGATIVE NEGATIVE   Leukocytes,Ua NEGATIVE NEGATIVE    Comment: Performed at [facility], [address] [place], [place]  Resp Panel by RT-PCR (Flu A&B, Covid) Nasopharyngeal Swab     Status: Abnormal   Collection Time: 09/12/20  9:25 PM   Specimen: Nasopharyngeal Swab; Nasopharyngeal(NP) swabs in vial transport medium  Result Value  Ref Range   SARS Coronavirus 2 by RT PCR POSITIVE (A) NEGATIVE    Comment: RESULT CALLED TO, READ BACK BY AND VERIFIED WITH: 09/14/20 RN 09/12/20 AT 2325 SK  (NOTE) SARS-CoV-2 target  nucleic acids are DETECTED.  The SARS-CoV-2 RNA is generally detectable in upper respiratory specimens during the acute phase of infection. Positive results are indicative of the presence of the identified virus, but do not rule out bacterial infection or co-infection with other pathogens not detected by the test. Clinical correlation with patient history and other diagnostic information is necessary to determine patient infection status. The expected result is Negative.  Fact Sheet for Patients: BloggerCourse.com  Fact Sheet for Healthcare Providers: SeriousBroker.it  This test is not yet approved or cleared by the [place] FDA and  has been authorized for detection and/or diagnosis of SARS-CoV-2 by FDA under an Emergency Use Authorization (EUA).  This EUA will remain in effect (meaning this test can be  used) for the duration of  the COVID-19 declaration under Section 564(b)(1) of the Act, 21 U.S.C. section 360bbb-3(b)(1), unless the authorization is terminated or revoked sooner.     Influenza A by PCR NEGATIVE NEGATIVE   Influenza B by PCR NEGATIVE NEGATIVE    Comment: (NOTE) The Xpert Xpress SARS-CoV-2/FLU/RSV plus assay is intended as an aid in the diagnosis of influenza from Nasopharyngeal swab specimens and should not be used as a sole basis for treatment. Nasal washings and aspirates are unacceptable for Xpert Xpress SARS-CoV-2/FLU/RSV testing.  Fact Sheet for Patients: BloggerCourse.com  Fact Sheet for Healthcare Providers: SeriousBroker.it  This test is not yet approved or cleared by the [place] FDA and has been authorized for detection and/or diagnosis of SARS-CoV-2  by FDA under an Emergency Use Authorization (EUA). This EUA will remain in effect (meaning this test can be used) for the duration of the COVID-19 declaration under Section 564(b)(1) of the Act, 21 U.S.C. section 360bbb-3(b)(1), unless the authorization is terminated or revoked.  Performed at [facility], [address], [place], [place] [address]      Assessment and Plan  COVID-19 affecting pregnancy in third trimester  - Message sent mAB infusion clinic to get patient scheduled for mAB - Advised could be scheduled in 24-48 hours - Information provided on COVID-19, quarantine, what test results mean, how to protect self and others, and how pregnancy is affected by COVID-19  Nausea and vomiting during pregnancy  - Rx for Phenergan 25 mg tablet po every 6 hr prn N/V   - Discharge patient - Await call for mAB clinic - Keep scheduled appt with [facility] on 12/27 -- msg sent to [facility] Admin pool to alert of (+) COVID-19 results  - Patient verbalized an understanding of the plan of care and agrees.   Raelyn Mora, CNM 09/12/2020, 9:22 PM

## 2020-09-12 NOTE — MAU Note (Signed)
Pt reports she has been unable to keep anything down today, reports her legs are sore and hurting. States her knees, feet , ankles all hurt. Has been hot/cold all day. Reports positive fetal movement.

## 2020-09-13 ENCOUNTER — Encounter (HOSPITAL_COMMUNITY): Payer: Self-pay

## 2020-09-13 ENCOUNTER — Telehealth: Payer: Self-pay | Admitting: Nurse Practitioner

## 2020-09-13 ENCOUNTER — Telehealth (HOSPITAL_COMMUNITY): Payer: Self-pay

## 2020-09-13 MED ORDER — PROMETHAZINE HCL 25 MG PO TABS
25.0000 mg | ORAL_TABLET | Freq: Four times a day (QID) | ORAL | 1 refills | Status: DC | PRN
Start: 2020-09-13 — End: 2021-07-21

## 2020-09-13 NOTE — Discharge Instructions (Signed)
Please let Melinda Jimenez know if you are still having symptoms prior to your next appointment on 09/26/2020.

## 2020-09-13 NOTE — Telephone Encounter (Signed)
Called to Discuss with patient about Covid symptoms and the use of the monoclonal antibody infusion for those with mild to moderate Covid symptoms and at a high risk of hospitalization.     Pt appears to qualify for this infusion due to co-morbid conditions and/or a member of an at-risk group in accordance with the FDA Emergency Use Authorization. (BMI >30, recent pregnancy).    Unable to reach pt. Voicemail left.   Willette Alma, NP [facility]  [phone]

## 2020-09-13 NOTE — Telephone Encounter (Signed)
Called to Discuss with patient about Covid symptoms and the use of the monoclonal antibody infusion for those with mild to moderate Covid symptoms and at a high risk of hospitalization.     Pt appears to qualify for this infusion due to co-morbid conditions and/or a member of an at-risk group in accordance with the FDA Emergency Use Authorization.    Unable to reach pt    

## 2020-09-14 ENCOUNTER — Telehealth: Payer: Self-pay | Admitting: Family

## 2020-09-14 NOTE — Telephone Encounter (Signed)
Called to Discuss with patient about Covid symptoms and the use of the monoclonal antibody infusion for those with mild to moderate Covid symptoms and at a high risk of hospitalization.     Pt appears to qualify for this infusion due to co-morbid conditions and/or a member of an at-risk group in accordance with the FDA Emergency Use Authorization.    This was a second attempt to reach Ms. Rovira as a previous message was left yesterday. I left a generic voicemail and sent a second MyChart message. Appears to qualify with risk factors including pregnancy, BMI >25 and high SVI score.   Marcos Eke, NP 09/14/2020 2:38 PM

## 2020-09-20 ENCOUNTER — Encounter (HOSPITAL_COMMUNITY): Payer: Self-pay | Admitting: Family Medicine

## 2020-09-20 ENCOUNTER — Inpatient Hospital Stay (HOSPITAL_COMMUNITY): Payer: Medicaid Other

## 2020-09-20 ENCOUNTER — Inpatient Hospital Stay (HOSPITAL_COMMUNITY)
Admission: AD | Admit: 2020-09-20 | Discharge: 2020-09-21 | Disposition: A | Discharge status: Home / Self Care | Payer: Medicaid Other | Attending: Family Medicine | Admitting: Family Medicine

## 2020-09-20 ENCOUNTER — Other Ambulatory Visit: Payer: Self-pay

## 2020-09-20 DIAGNOSIS — O98513 Other viral diseases complicating pregnancy, third trimester: Secondary | ICD-10-CM | POA: Insufficient documentation

## 2020-09-20 DIAGNOSIS — O99513 Diseases of the respiratory system complicating pregnancy, third trimester: Secondary | ICD-10-CM | POA: Insufficient documentation

## 2020-09-20 DIAGNOSIS — O99891 Other specified diseases and conditions complicating pregnancy: Secondary | ICD-10-CM | POA: Diagnosis not present

## 2020-09-20 DIAGNOSIS — Z3A32 32 weeks gestation of pregnancy: Secondary | ICD-10-CM

## 2020-09-20 DIAGNOSIS — R079 Chest pain, unspecified: Secondary | ICD-10-CM

## 2020-09-20 DIAGNOSIS — U071 COVID-19: Secondary | ICD-10-CM | POA: Diagnosis not present

## 2020-09-20 DIAGNOSIS — J1282 Pneumonia due to coronavirus disease 2019: Secondary | ICD-10-CM

## 2020-09-20 LAB — COMPREHENSIVE METABOLIC PANEL
ALT: 10 U/L (ref 0–44)
AST: 17 U/L (ref 15–41)
Albumin: 2.5 g/dL — ABNORMAL LOW (ref 3.5–5.0)
Alkaline Phosphatase: 73 U/L (ref 38–126)
Anion gap: 11 (ref 5–15)
BUN: <5 mg/dL — ABNORMAL LOW (ref 6–20)
CO2: 21 mmol/L — ABNORMAL LOW (ref 22–32)
Calcium: 8.2 mg/dL — ABNORMAL LOW (ref 8.9–10.3)
Chloride: 102 mmol/L (ref 98–111)
Creatinine, Ser: 0.44 mg/dL (ref 0.44–1.00)
GFR, Estimated: >60 mL/min (ref >60)
Glucose, Bld: 109 mg/dL — ABNORMAL HIGH (ref 70–99)
Potassium: 3.1 mmol/L — ABNORMAL LOW (ref 3.5–5.1)
Sodium: 134 mmol/L — ABNORMAL LOW (ref 135–145)
Total Bilirubin: 0.7 mg/dL (ref 0.3–1.2)
Total Protein: 6.9 g/dL (ref 6.5–8.1)

## 2020-09-20 LAB — CBC WITH DIFFERENTIAL/PLATELET
Abs Immature Granulocytes: 0.02 10*3/uL (ref 0.00–0.07)
Basophils Absolute: 0 10*3/uL (ref 0.0–0.1)
Basophils Relative: 0 %
Eosinophils Absolute: 0 10*3/uL (ref 0.0–0.5)
Eosinophils Relative: 0 %
HCT: 33.8 % — ABNORMAL LOW (ref 36.0–46.0)
Hemoglobin: 10.5 g/dL — ABNORMAL LOW (ref 12.0–15.0)
Immature Granulocytes: 0 %
Lymphocytes Relative: 30 %
Lymphs Abs: 1.4 10*3/uL (ref 0.7–4.0)
MCH: 22.8 pg — ABNORMAL LOW (ref 26.0–34.0)
MCHC: 31.1 g/dL (ref 30.0–36.0)
MCV: 73.3 fL — ABNORMAL LOW (ref 80.0–100.0)
Monocytes Absolute: 0.5 10*3/uL (ref 0.1–1.0)
Monocytes Relative: 11 %
Neutro Abs: 2.8 10*3/uL (ref 1.7–7.7)
Neutrophils Relative %: 59 %
Platelets: 261 10*3/uL (ref 150–400)
RBC: 4.61 MIL/uL (ref 3.87–5.11)
RDW: 15.8 % — ABNORMAL HIGH (ref 11.5–15.5)
WBC: 4.7 10*3/uL (ref 4.0–10.5)
nRBC: 0 % (ref 0.0–0.2)

## 2020-09-20 LAB — URINALYSIS, ROUTINE W REFLEX MICROSCOPIC
Bilirubin Urine: NEGATIVE
Glucose, UA: 50 mg/dL — AB
Hgb urine dipstick: NEGATIVE
Ketones, ur: NEGATIVE mg/dL
Leukocytes,Ua: NEGATIVE
Nitrite: NEGATIVE
Protein, ur: 100 mg/dL — AB
Specific Gravity, Urine: 1.029 (ref 1.005–1.030)
pH: 6 (ref 5.0–8.0)

## 2020-09-20 LAB — BRAIN NATRIURETIC PEPTIDE: B Natriuretic Peptide: 22.3 pg/mL (ref 0.0–100.0)

## 2020-09-20 LAB — TROPONIN I (HIGH SENSITIVITY): Troponin I (High Sensitivity): 6 ng/L (ref <18)

## 2020-09-20 MED ORDER — SODIUM CHLORIDE 0.9 % IV BOLUS
500.0000 mL | Freq: Once | INTRAVENOUS | Status: AC
  Administered 2020-09-20: 21:00:00 500 mL via INTRAVENOUS

## 2020-09-20 MED ORDER — FAMOTIDINE IN NACL 20-0.9 MG/50ML-% IV SOLN: 20.0000 mg | Freq: Once | INTRAVENOUS | Status: DC | PRN

## 2020-09-20 MED ORDER — SODIUM CHLORIDE 0.9 % IV SOLN
1200.0000 mg | Freq: Once | INTRAVENOUS | Status: AC
  Administered 2020-09-20: 22:00:00 1200 mg via INTRAVENOUS
  Filled 2020-09-20: qty 1200

## 2020-09-20 MED ORDER — SODIUM CHLORIDE 0.9 % IV SOLN
INTRAVENOUS | Status: DC | PRN
  Administered 2020-09-20: 22:00:00 1000 mL via INTRAVENOUS

## 2020-09-20 MED ORDER — ALBUTEROL SULFATE HFA 108 (90 BASE) MCG/ACT IN AERS: 2.0000 | INHALATION_SPRAY | Freq: Once | RESPIRATORY_TRACT | Status: DC | PRN

## 2020-09-20 MED ORDER — SODIUM CHLORIDE 0.9 % IV SOLN: 1200.0000 mg | Freq: Once | INTRAVENOUS | Status: DC

## 2020-09-20 MED ORDER — EPINEPHRINE 0.3 MG/0.3ML IJ SOAJ: 0.3000 mg | Freq: Once | INTRAMUSCULAR | Status: DC | PRN

## 2020-09-20 MED ORDER — DIPHENHYDRAMINE HCL 50 MG/ML IJ SOLN: 50.0000 mg | Freq: Once | INTRAMUSCULAR | Status: DC | PRN

## 2020-09-20 MED ORDER — METHYLPREDNISOLONE SODIUM SUCC 125 MG IJ SOLR: 125.0000 mg | Freq: Once | INTRAMUSCULAR | Status: DC | PRN

## 2020-09-20 NOTE — Discharge Instructions (Signed)
COVID-19 COVID-19 is a respiratory infection that is caused by a virus called severe acute respiratory syndrome coronavirus 2 (SARS-CoV-2). The disease is also known as coronavirus disease or novel coronavirus. In some people, the virus may not cause any symptoms. In others, it may cause a serious infection. The infection can get worse quickly and can lead to complications, such as:  Pneumonia, or infection of the lungs.  Acute respiratory distress syndrome or ARDS. This is a condition in which fluid build-up in the lungs prevents the lungs from filling with air and passing oxygen into the blood.  Acute respiratory failure. This is a condition in which there is not enough oxygen passing from the lungs to the body or when carbon dioxide is not passing from the lungs out of the body.  Sepsis or septic shock. This is a serious bodily reaction to an infection.  Blood clotting problems.  Secondary infections due to bacteria or fungus.  Organ failure. This is when your body's organs stop working. The virus that causes COVID-19 is contagious. This means that it can spread from person to person through droplets from coughs and sneezes (respiratory secretions). What are the causes? This illness is caused by a virus. You may catch the virus by:  Breathing in droplets from an infected person. Droplets can be spread by a person breathing, speaking, singing, coughing, or sneezing.  Touching something, like a table or a doorknob, that was exposed to the virus (contaminated) and then touching your mouth, nose, or eyes. What increases the risk? Risk for infection You are more likely to be infected with this virus if you:  Are within 6 feet (2 meters) of a person with COVID-19.  Provide care for or live with a person who is infected with COVID-19.  Spend time in crowded indoor spaces or live in shared housing. Risk for serious illness You are more likely to become seriously ill from the virus if you:   Are 50 years of age or older. The higher your age, the more you are at risk for serious illness.  Live in a nursing home or long-term care facility.  Have cancer.  Have a long-term (chronic) disease such as: ? Chronic lung disease, including chronic obstructive pulmonary disease or asthma. ? A long-term disease that lowers your body's ability to fight infection (immunocompromised). ? Heart disease, including heart failure, a condition in which the arteries that lead to the heart become narrow or blocked (coronary artery disease), a disease which makes the heart muscle thick, weak, or stiff (cardiomyopathy). ? Diabetes. ? Chronic kidney disease. ? Sickle cell disease, a condition in which red blood cells have an abnormal "sickle" shape. ? Liver disease.  Are obese. What are the signs or symptoms? Symptoms of this condition can range from mild to severe. Symptoms may appear any time from 2 to 14 days after being exposed to the virus. They include:  A fever or chills.  A cough.  Difficulty breathing.  Headaches, body aches, or muscle aches.  Runny or stuffy (congested) nose.  A sore throat.  New loss of taste or smell. Some people may also have stomach problems, such as nausea, vomiting, or diarrhea. Other people may not have any symptoms of COVID-19. How is this diagnosed? This condition may be diagnosed based on:  Your signs and symptoms, especially if: ? You live in an area with a COVID-19 outbreak. ? You recently traveled to or from an area where the virus is common. ? You   provide care for or live with a person who was diagnosed with COVID-19. ? You were exposed to a person who was diagnosed with COVID-19.  A physical exam.  Lab tests, which may include: ? Taking a sample of fluid from the back of your nose and throat (nasopharyngeal fluid), your nose, or your throat using a swab. ? A sample of mucus from your lungs (sputum). ? Blood tests.  Imaging tests, which  may include, X-rays, CT scan, or ultrasound. How is this treated? At present, there is no medicine to treat COVID-19. Medicines that treat other diseases are being used on a trial basis to see if they are effective against COVID-19. Your health care provider will talk with you about ways to treat your symptoms. For most people, the infection is mild and can be managed at home with rest, fluids, and over-the-counter medicines. Treatment for a serious infection usually takes places in a hospital intensive care unit (ICU). It may include one or more of the following treatments. These treatments are given until your symptoms improve.  Receiving fluids and medicines through an IV.  Supplemental oxygen. Extra oxygen is given through a tube in the nose, a face mask, or a hood.  Positioning you to lie on your stomach (prone position). This makes it easier for oxygen to get into the lungs.  Continuous positive airway pressure (CPAP) or bi-level positive airway pressure (BPAP) machine. This treatment uses mild air pressure to keep the airways open. A tube that is connected to a motor delivers oxygen to the body.  Ventilator. This treatment moves air into and out of the lungs by using a tube that is placed in your windpipe.  Tracheostomy. This is a procedure to create a hole in the neck so that a breathing tube can be inserted.  Extracorporeal membrane oxygenation (ECMO). This procedure gives the lungs a chance to recover by taking over the functions of the heart and lungs. It supplies oxygen to the body and removes carbon dioxide. Follow these instructions at home: Lifestyle  If you are sick, stay home except to get medical care. Your health care provider will tell you how long to stay home. Call your health care provider before you go for medical care.  Rest at home as told by your health care provider.  Do not use any products that contain nicotine or tobacco, such as cigarettes, e-cigarettes, and  chewing tobacco. If you need help quitting, ask your health care provider.  Return to your normal activities as told by your health care provider. Ask your health care provider what activities are safe for you. General instructions  Take over-the-counter and prescription medicines only as told by your health care provider.  Drink enough fluid to keep your urine pale yellow.  Keep all follow-up visits as told by your health care provider. This is important. How is this prevented?  There is no vaccine to help prevent COVID-19 infection. However, there are steps you can take to protect yourself and others from this virus. To protect yourself:   Do not travel to areas where COVID-19 is a risk. The areas where COVID-19 is reported change often. To identify high-risk areas and travel restrictions, check the CDC travel website: wwwnc.cdc.gov/travel/notices  If you live in, or must travel to, an area where COVID-19 is a risk, take precautions to avoid infection. ? Stay away from people who are sick. ? Wash your hands often with soap and water for 20 seconds. If soap and water   are not available, use an alcohol-based hand sanitizer. ? Avoid touching your mouth, face, eyes, or nose. ? Avoid going out in public, follow guidance from your state and local health authorities. ? If you must go out in public, wear a cloth face covering or face mask. Make sure your mask covers your nose and mouth. ? Avoid crowded indoor spaces. Stay at least 6 feet (2 meters) away from others. ? Disinfect objects and surfaces that are frequently touched every day. This may include:  Counters and tables.  Doorknobs and light switches.  Sinks and faucets.  Electronics, such as phones, remote controls, keyboards, computers, and tablets. To protect others: If you have symptoms of COVID-19, take steps to prevent the virus from spreading to others.  If you think you have a COVID-19 infection, contact your health care  provider right away. Tell your health care team that you think you may have a COVID-19 infection.  Stay home. Leave your house only to seek medical care. Do not use public transport.  Do not travel while you are sick.  Wash your hands often with soap and water for 20 seconds. If soap and water are not available, use alcohol-based hand sanitizer.  Stay away from other members of your household. Let healthy household members care for children and pets, if possible. If you have to care for children or pets, wash your hands often and wear a mask. If possible, stay in your own room, separate from others. Use a different bathroom.  Make sure that all people in your household wash their hands well and often.  Cough or sneeze into a tissue or your sleeve or elbow. Do not cough or sneeze into your hand or into the air.  Wear a cloth face covering or face mask. Make sure your mask covers your nose and mouth. Where to find more information  Centers for Disease Control and Prevention: www.cdc.gov/coronavirus/2019-ncov/index.html  World Health Organization: www.who.int/health-topics/coronavirus Contact a health care provider if:  You live in or have traveled to an area where COVID-19 is a risk and you have symptoms of the infection.  You have had contact with someone who has COVID-19 and you have symptoms of the infection. Get help right away if:  You have trouble breathing.  You have pain or pressure in your chest.  You have confusion.  You have bluish lips and fingernails.  You have difficulty waking from sleep.  You have symptoms that get worse. These symptoms may represent a serious problem that is an emergency. Do not wait to see if the symptoms will go away. Get medical help right away. Call your local emergency services (911 in the [place] Do not drive yourself to the hospital. Let the emergency medical personnel know if you think you have COVID-19. Summary  COVID-19 is a  respiratory infection that is caused by a virus. It is also known as coronavirus disease or novel coronavirus. It can cause serious infections, such as pneumonia, acute respiratory distress syndrome, acute respiratory failure, or sepsis.  The virus that causes COVID-19 is contagious. This means that it can spread from person to person through droplets from breathing, speaking, singing, coughing, or sneezing.  You are more likely to develop a serious illness if you are 50 years of age or older, have a weak immune system, live in a nursing home, or have chronic disease.  There is no medicine to treat COVID-19. Your health care provider will talk with you about ways to treat your symptoms.    Take steps to protect yourself and others from infection. Wash your hands often and disinfect objects and surfaces that are frequently touched every day. Stay away from people who are sick and wear a mask if you are sick. This information is not intended to replace advice given to you by your health care provider. Make sure you discuss any questions you have with your health care provider. Document Revised: 07/17/2019 Document Reviewed: 10/23/2018 [employer] Patient Education  2020 [employer]  

## 2020-09-20 NOTE — MAU Note (Signed)
IV insertion attempted, Pt refusing to keep IV site in at this time. Need for IV access discussed w/ pt. IV removed per pts wishes, MD notified.

## 2020-09-20 NOTE — MAU Provider Note (Addendum)
Chief Complaint:  Generalized Body Aches, Fatigue, and Chest Pain  HPI: Melinda Jimenez is a 34 y.o. [id number] at [redacted]w[redacted]d by 6 week ultrasound who presents to maternity admissions reporting concern of worsening COVID symptoms, specifically generalized body aches, chills and chest discomfort. Of note, pt was diagnosed with COVID in MAU on 09/12/20. Multiple attempts were made to contact pt to schedule her for outpatient monoclonal antibody infusion, but no direct communication with pt was achieved. No recent fever or chills but pt overall feeling "very tired". Of note, pt has history of Cesarean x2 and gestational diabetes in prior pregnancy. She   She reports good fetal movement, denies LOF, vaginal bleeding, vaginal itching/burning, urinary symptoms, h/a, dizziness, n/v, or fever/chills.   Past Medical History: Past Medical History:  Diagnosis Date   Anemia    Anxiety    off meds with preg   Carpal tunnel syndrome    Chlamydia    Genital HSV    Last OB: > 40yr ago; Valtrex 500 mg po prn outbreaks   GERD (gastroesophageal reflux disease)    Gestational diabetes    diet controlled   Headache(784.0)    IBS (irritable bowel syndrome)    Infection    UTI   Mood swings    Panic attack    Thyroid disease    Trichomonas     Past obstetric history: OB History  Gravida Para Term Preterm AB Living  10 4 3 1 5 3   SAB IAB Ectopic Multiple Live Births  2 3   0 3    # Outcome Date GA Lbr Len/2nd Weight Sex Delivery Anes PTL Lv  10 Current           9 Preterm 04/06/17 [redacted]w[redacted]d  3495 g F CS-LTranv Spinal  LIV  8 Term 05/20/13 [redacted]w[redacted]d  2835 g M CS-LTranv Spinal, EPI       Birth Comments: C-cestion due to active HSV lesions at time of delivery  7 Term 2007 [redacted]w[redacted]d   M Vag-Spont EPI  LIV  6 Term 2005 [redacted]w[redacted]d   M Vag-Spont EPI  DEC  5 IAB           4 IAB              Birth Comments: [redacted]w[redacted]d. Please review and update pregnancy details.  3 IAB           2 SAB         ND  1 SAB              Past Surgical History: Past Surgical History:  Procedure Laterality Date   CESAREAN SECTION N/A 05/20/2013   Procedure: CESAREAN SECTION;  Surgeon: 05/22/2013, MD;  Location: [facility];  Service: Obstetrics;  Laterality: N/A;   CESAREAN SECTION N/A 04/06/2017   Procedure: CESAREAN SECTION;  Surgeon: 06/07/2017, MD;  Location: [facility] BIRTHING SUITES;  Service: Obstetrics;  Laterality: N/A;   INDUCED ABORTION  2009   VAGINAL DELIVERY  2007    Family History: Family History  Problem Relation Age of Onset   Depression Mother    Anxiety disorder Mother    Breast cancer Other    Bipolar disorder Brother    Diabetes Maternal Grandmother    Cancer Maternal Grandmother    Hypertension Maternal Grandmother    Hypertension Paternal Grandmother    Autism Other    Schizophrenia Other     Social History: Social History   Tobacco Use   Smoking  status: Passive Smoke Exposure - Never Smoker   Smokeless tobacco: Never Used   Tobacco comment: was not a daily smoker, tried it  Vaping Use   Vaping Use: Never used  Substance Use Topics   Alcohol use: No    Comment: Twice a month   Drug use: No    Allergies:  Allergies  Allergen Reactions   Zofran [Ondansetron Hcl] Nausea And Vomiting   Latex Itching    Meds:  Medications Prior to Admission  Medication Sig Dispense Refill Last Dose   acetaminophen (TYLENOL) 500 MG tablet Take 500 mg by mouth every 6 (six) hours as needed.   09/20/2020 at Unknown time   guaifenesin (ROBITUSSIN) 100 MG/5ML syrup Take 200 mg by mouth 3 (three) times daily as needed for cough.   09/20/2020 at Unknown time   famotidine (PEPCID) 20 MG tablet Take 1 tablet (20 mg total) by mouth 2 (two) times daily. 60 tablet 1    Prenatal Vit-Fe Fumarate-FA (PREPLUS) 27-1 MG TABS Take 1 tablet by mouth daily. 30 tablet 13    promethazine (PHENERGAN) 25 MG tablet Take 1 tablet (25 mg total) by mouth every 6 (six) hours as needed for  nausea or vomiting. 30 tablet 1     ROS:  Review of Systems  Constitutional: Positive for activity change, appetite change and fatigue. Negative for chills and fever.  HENT: Positive for congestion and sore throat.   Respiratory: Negative for cough.   Cardiovascular: Positive for chest pain.  Gastrointestinal: Positive for abdominal pain, nausea and vomiting.  Genitourinary: Negative for dysuria, vaginal bleeding, vaginal discharge and vaginal pain.  Neurological: Negative for headaches.   I have reviewed patient's Past Medical Hx, Surgical Hx, Family Hx, Social Hx, medications and allergies.   Physical Exam   Patient Vitals for the past 24 hrs:  BP Temp Temp src Pulse Resp SpO2  09/20/20 2235 (!) 96/52 -- -- 98 20 97 %  09/20/20 2230 -- -- -- -- -- 97 %  09/20/20 2225 -- -- -- -- -- 97 %  09/20/20 2221 (!) 99/57 -- -- 99 20 --  09/20/20 2010 -- -- -- -- -- 96 %  09/20/20 2005 -- -- -- -- -- 97 %  09/20/20 2000 -- -- -- -- -- 97 %  09/20/20 1955 -- -- -- -- -- 97 %  09/20/20 1950 -- -- -- -- -- 96 %  09/20/20 1940 -- -- -- -- -- 97 %  09/20/20 1935 -- -- -- -- -- 96 %  09/20/20 1930 -- -- -- -- -- 97 %  09/20/20 1836 -- -- -- -- -- 96 %  09/20/20 1826 -- -- -- -- -- 97 %  09/20/20 1816 -- -- -- -- -- 97 %  09/20/20 1641 101/68 97.8 F (36.6 C) Axillary (!) 106 18 --   Constitutional: Well-developed, well-nourished female in no acute distress. Curled up and tearful at times. Cardiovascular: normal rate Respiratory: normal effort GI: Abd soft, mild diffuse abdominal tenderness to palpation, gravid appropriate for gestational age.  MS: Extremities nontender, no edema, normal ROM. Neurologic: Alert and oriented x 4.  GU: deferred  FHT: Baseline 150, moderate variability, +accelerations present, no decelerations, reactive strip Toco: no contractions   Labs: Results for orders placed or performed during the hospital encounter of 09/20/20 (from the past 24 hour(s))  CBC  with Differential/Platelet     Status: Abnormal   Collection Time: 09/20/20  5:37 PM  Result Value Ref Range  WBC 4.7 4.0 - 10.5 K/uL   RBC 4.61 3.87 - 5.11 MIL/uL   Hemoglobin 10.5 (L) 12.0 - 15.0 g/dL   HCT 16.133.8 (L) 09.636.0 - 04.546.0 %   MCV 73.3 (L) 80.0 - 100.0 fL   MCH 22.8 (L) 26.0 - 34.0 pg   MCHC 31.1 30.0 - 36.0 g/dL   RDW 40.915.8 (H) 81.111.5 - 91.415.5 %   Platelets 261 150 - 400 K/uL   nRBC 0.0 0.0 - 0.2 %   Neutrophils Relative % 59 %   Neutro Abs 2.8 1.7 - 7.7 K/uL   Lymphocytes Relative 30 %   Lymphs Abs 1.4 0.7 - 4.0 K/uL   Monocytes Relative 11 %   Monocytes Absolute 0.5 0.1 - 1.0 K/uL   Eosinophils Relative 0 %   Eosinophils Absolute 0.0 0.0 - 0.5 K/uL   Basophils Relative 0 %   Basophils Absolute 0.0 0.0 - 0.1 K/uL   Immature Granulocytes 0 %   Abs Immature Granulocytes 0.02 0.00 - 0.07 K/uL  Brain natriuretic peptide     Status: None   Collection Time: 09/20/20  5:37 PM  Result Value Ref Range   B Natriuretic Peptide 22.3 0.0 - 100.0 pg/mL  Comprehensive metabolic panel     Status: Abnormal   Collection Time: 09/20/20  5:37 PM  Result Value Ref Range   Sodium 134 (L) 135 - 145 mmol/L   Potassium 3.1 (L) 3.5 - 5.1 mmol/L   Chloride 102 98 - 111 mmol/L   CO2 21 (L) 22 - 32 mmol/L   Glucose, Bld 109 (H) 70 - 99 mg/dL   BUN <5 (L) 6 - 20 mg/dL   Creatinine, Ser 7.820.44 0.44 - 1.00 mg/dL   Calcium 8.2 (L) 8.9 - 10.3 mg/dL   Total Protein 6.9 6.5 - 8.1 g/dL   Albumin 2.5 (L) 3.5 - 5.0 g/dL   AST 17 15 - 41 U/L   ALT 10 0 - 44 U/L   Alkaline Phosphatase 73 38 - 126 U/L   Total Bilirubin 0.7 0.3 - 1.2 mg/dL   GFR, Estimated >95>60 >62>60 mL/min   Anion gap 11 5 - 15  Troponin I (High Sensitivity)     Status: None   Collection Time: 09/20/20  5:37 PM  Result Value Ref Range   Troponin I (High Sensitivity) 6 <18 ng/L  Urinalysis, Routine w reflex microscopic Urine, Clean Catch     Status: Abnormal   Collection Time: 09/20/20  6:47 PM  Result Value Ref Range   Color, Urine  AMBER (A) YELLOW   APPearance HAZY (A) CLEAR   Specific Gravity, Urine 1.029 1.005 - 1.030   pH 6.0 5.0 - 8.0   Glucose, UA 50 (A) NEGATIVE mg/dL   Hgb urine dipstick NEGATIVE NEGATIVE   Bilirubin Urine NEGATIVE NEGATIVE   Ketones, ur NEGATIVE NEGATIVE mg/dL   Protein, ur 130100 (A) NEGATIVE mg/dL   Nitrite NEGATIVE NEGATIVE   Leukocytes,Ua NEGATIVE NEGATIVE   RBC / HPF 0-5 0 - 5 RBC/hpf   WBC, UA 0-5 0 - 5 WBC/hpf   Bacteria, UA RARE (A) NONE SEEN   Squamous Epithelial / LPF 6-10 0 - 5   Mucus PRESENT       Imaging:  DG CHEST PORT 1 VIEW  Result Date: 09/20/2020 CLINICAL DATA:  Shortness of breath. Lightheadedness. COVID positive EXAM: PORTABLE CHEST 1 VIEW COMPARISON:  March 29, 2020 FINDINGS: The lung volumes are low. Bibasilar airspace consolidation is noted, right worse than left. There  is suggestion of a right-sided pleural effusion. The heart size is unremarkable. There is no pneumothorax. No acute osseous abnormality. IMPRESSION: 1. Bibasilar airspace consolidation, right worse than left, concerning for multifocal pneumonia. 2. Possible right-sided pleural effusion. Electronically Signed   By: Katherine Mantle M.D.   On: 09/20/2020 18:02    MAU Course/MDM: Orders Placed This Encounter  Procedures   DG CHEST PORT 1 VIEW   Urinalysis, Routine w reflex microscopic Urine, Clean Catch   CBC with Differential/Platelet   Brain natriuretic peptide   Comprehensive metabolic panel   Hypersensitivity GRADE 1: Transient flushing or rash, or drug fever < 100.4 F   Hypersensitivity GRADE 2: Rash, flushing, urticaria, dyspnea, or drug fever = or > 100.4 F   Hypersensitivity GRADE 3: symptomatic bronchospasm, with or without urticaria, parenteral medication management indicated, allergy-related edema/angioedema, or hypotension   Hypersensitivity GRADE 4: Anaphylaxis   Fetal monitoring   Assess fetal heart tones   Provide patient the appropriate monoclonal antibody fact sheet  prior to administration   casirivimab / imdevimab per pharmacy consult   Incentive spirometry RT   EKG 12-Lead    Meds ordered this encounter  Medications   sodium chloride 0.9 % bolus 500 mL   DISCONTD: casirivimab (REGN 10933) 600 mg, imdevimab (REGN [id number]) 600 mg in sodium chloride 0.9 % 110 mL IVPB    Order Specific Question:   I attest that this patient meets the FDA Emergency Use Authorization criteria and has risk factor(s) for progression to severe COVID-19 and/or hospitalization:    Answer:   Yes    Order Specific Question:   High Risk Factor(s) for COVID-19 Progression:    Answer:   Pregnancy   0.9 %  sodium chloride infusion   diphenhydrAMINE (BENADRYL) injection 50 mg   famotidine (PEPCID) IVPB 20 mg premix   methylPREDNISolone sodium succinate (SOLU-MEDROL) 125 mg/2 mL injection 125 mg   albuterol (VENTOLIN HFA) 108 (90 Base) MCG/ACT inhaler 2 puff   EPINEPHrine (EPI-PEN) injection 0.3 mg   casirivimab-imdevimab (REGEN-COV) 1,200 mg in sodium chloride 0.9 % 110 mL IVPB    Order Specific Question:   I attest that this patient meets the FDA Emergency Use Authorization criteria and has risk factor(s) for progression to severe COVID-19 and/or hospitalization:    Answer:   Yes    Order Specific Question:   High Risk Factor(s) for COVID-19 Progression:    Answer:   Pregnancy    NST reviewed Consult with Dr. Jolayne Panther with presentation, exam findings and test results.  Reassuringly, no leukocytosis. CBC notable for Hgb 10.5 but platelets wnl. Pro-BNP & troponin normal. UA unremarkable except for 100 protein and 50 glucose.  Treatments in MAU included 500cc IVF bolus & monoclonal antibodies as noted below. Pt declined tylenol.  After extensive discussion with patient and her husband, pt is agreeable to monoclonal antibody infusion.  Assessment: 1. Pneumonia due to COVID-19 virus   2. Chest pain   3. Chest pain during pregnancy   PT is a 34yo [id number] female at  [redacted]w[redacted]d who presents with concern of chest pain s/p recent diagnosis of COVID infection on on 12/16 in MAU. Multiple attempts to contact pt for outpatient monoclonal antibodies but previously unable to meet pt. Today pt afebrile with normal vital signs and no oxygen desaturations. In conversation with pharmacy, able to administer monoclonal antibody infusion in MAU prior to discharge. Also administered IVF bolus but declined tylenol. - provided strict return precautions for worsening symptoms including SOB, dyspnea,  chest pain, and extreme fatigue - messaged [facility] prior to pt's discharge to schedule f/u prenatal appt  Plan: Discharge home s/p monoclonal antibody infusion. Labor precautions and fetal kick counts. Message sent to [facility] to reschedule  Allergies as of 09/20/2020      Reactions   Zofran [ondansetron Hcl] Nausea And Vomiting   Latex Itching      Medication List    TAKE these medications   acetaminophen 500 MG tablet Commonly known as: TYLENOL Take 500 mg by mouth every 6 (six) hours as needed.   famotidine 20 MG tablet Commonly known as: PEPCID Take 1 tablet (20 mg total) by mouth 2 (two) times daily.   guaifenesin 100 MG/5ML syrup Commonly known as: ROBITUSSIN Take 200 mg by mouth 3 (three) times daily as needed for cough.   PrePLUS 27-1 MG Tabs Take 1 tablet by mouth daily.   promethazine 25 MG tablet Commonly known as: PHENERGAN Take 1 tablet (25 mg total) by mouth every 6 (six) hours as needed for nausea or vomiting.      Sheila Oats, MD OB Fellow, [facility] 09/20/2020 11:28 PM   Reassessment (11:54 PM) Discharge order placed.  Message sent to [facility] for rescheduling of appts. Nurse instructed to inform patient of change in appts. Patient to return to MAU as needed.  Cherre Robins MSN, CNM Advanced Practice Provider, [facility] [employer]

## 2020-09-20 NOTE — MAU Note (Signed)
PT presents to MAU with complaints of feeling worse with generalized body aches, chills and chest pain. Was diagnosed with covid 8 days ago. Pt won't answer questions all she keeps saying is she feels worse and is tired

## 2020-09-20 NOTE — MAU Note (Addendum)
Antibody medication currently infusing per MD order. Consent signed, fact sheet given to pt. Discussed medication administration w/ pt and family. Fetal HR being monitored as well as VS and any potential reactions. Pt reports no issues at this time, will continue to stay at bedside for remainder of administration.

## 2020-09-21 NOTE — MAU Note (Signed)
Discharge instructions reviewed w/ pt and significant other. Pt stated no additional questions at this time.

## 2020-09-26 ENCOUNTER — Encounter: Payer: Medicaid Other | Admitting: Obstetrics and Gynecology

## 2020-09-26 ENCOUNTER — Other Ambulatory Visit: Payer: Medicaid Other

## 2020-10-02 NOTE — Progress Notes (Deleted)
Cardiology Office Note:    Date:  10/02/2020   ID:  Melinda Jimenez, DOB [birth year], MRN [id number]  PCP:  Arvilla Market, DO  [facility] Cardiologist:  No primary care provider on file.  [facility] Electrophysiologist:  None   Referring MD: Leary Roca*    History of Present Illness:    Melinda Jimenez is a 35 y.o. female who is currently [redacted] weeks pregnant with a hx of anxiety, depression, gestational diabetes, and GERD who presents to clinic for follow-up.  During our last visit on 07/06/20, there was significant concern about the patient's mental health status. She was having delusional thoughts and it was difficult to discern the nature of her chest pain which was why she was initially referred. We were also concerned that the patient was pregnant at that time but she was convinced she had miscarried. While she refused to have a beta-hcg drawn or undergo a pelvic ultrasound during our evaluation, she did follow-up with OBGYN and she is now currently [redacted] weeks pregnant. She has been recently hospitalized with COVID-19 infection on 12/21-12/22 for which she received a monoclonal antibody infusion.   Today,   Past Medical History:  Diagnosis Date  . Anemia   . Anxiety    off meds with preg  . Carpal tunnel syndrome   . Chlamydia   . Genital HSV    Last OB: > 60yr ago; Valtrex 500 mg po prn outbreaks  . GERD (gastroesophageal reflux disease)   . Gestational diabetes    diet controlled  . Headache(784.0)   . IBS (irritable bowel syndrome)   . Infection    UTI  . Mood swings   . Panic attack   . Thyroid disease   . Trichomonas     Past Surgical History:  Procedure Laterality Date  . CESAREAN SECTION N/A 05/20/2013   Procedure: CESAREAN SECTION;  Surgeon: Brock Bad, MD;  Location: [facility];  Service: Obstetrics;  Laterality: N/A;  . CESAREAN SECTION N/A 04/06/2017   Procedure: CESAREAN SECTION;  Surgeon: Hermina Staggers, MD;  Location: [facility]  BIRTHING SUITES;  Service: Obstetrics;  Laterality: N/A;  . INDUCED ABORTION  2009  . VAGINAL DELIVERY  2007    Current Medications: No outpatient medications have been marked as taking for the 10/14/20 encounter (Appointment) with Meriam Sprague, MD.     Allergies:   Zofran Frazier Richards hcl] and Latex   Social History   Socioeconomic History  . Marital status: Single    Spouse name: Not on file  . Number of children: 1  . Years of education: Not on file  . Highest education level: Not on file  Occupational History  . Occupation: Unemployed  Tobacco Use  . Smoking status: Passive Smoke Exposure - Never Smoker  . Smokeless tobacco: Never Used  . Tobacco comment: was not a daily smoker, tried it  Vaping Use  . Vaping Use: Never used  Substance and Sexual Activity  . Alcohol use: No    Comment: Twice a month  . Drug use: No  . Sexual activity: Yes    Birth control/protection: None    Comment: Patient is interested in implant  Other Topics Concern  . Not on file  Social History Narrative  . Not on file   Social Determinants of Health   Financial Resource Strain: Not on file  Food Insecurity: Not on file  Transportation Needs: Not on file  Physical Activity: Not on file  Stress:  Not on file  Social Connections: Not on file     Family History: The patient's ***family history includes Anxiety disorder in her mother; Autism in an other family member; Bipolar disorder in her brother; Breast cancer in an other family member; Cancer in her maternal grandmother; Depression in her mother; Diabetes in her maternal grandmother; Hypertension in her maternal grandmother and paternal grandmother; Schizophrenia in an other family member.  ROS:   Please see the history of present illness.    *** All other systems reviewed and are negative.  EKGs/Labs/Other Studies Reviewed:    The following studies were reviewed today: ***  EKG:  EKG is *** ordered today.  The ekg  ordered today demonstrates ***  Recent Labs: 08/14/2020: TSH 1.326 09/20/2020: ALT 10; B Natriuretic Peptide 22.3; BUN <5; Creatinine, Ser 0.44; Hemoglobin 10.5; Platelets 261; Potassium 3.1; Sodium 134  Recent Lipid Panel No results found for: CHOL, TRIG, HDL, CHOLHDL, VLDL, LDLCALC, LDLDIRECT   Risk Assessment/Calculations:   {Does this patient have ATRIAL FIBRILLATION?:[id number]}   Physical Exam:    VS:  LMP  (LMP Unknown)     Wt Readings from Last 3 Encounters:  09/12/20 202 lb (91.6 kg)  08/14/20 206 lb 14.4 oz (93.8 kg)  08/02/20 207 lb (93.9 kg)     GEN: *** Well nourished, well developed in no acute distress HEENT: Normal NECK: No JVD; No carotid bruits LYMPHATICS: No lymphadenopathy CARDIAC: ***RRR, no murmurs, rubs, gallops RESPIRATORY:  Clear to auscultation without rales, wheezing or rhonchi  ABDOMEN: Soft, non-tender, non-distended MUSCULOSKELETAL:  No edema; No deformity  SKIN: Warm and dry NEUROLOGIC:  Alert and oriented x 3 PSYCHIATRIC:  Normal affect   ASSESSMENT:    No diagnosis found. PLAN:    In order of problems listed above:  #Chest Pain   {Are you ordering a CV Procedure (e.g. stress test, cath, DCCV, TEE, etc)?   Press F2        :[id number]}    Medication Adjustments/Labs and Tests Ordered: Current medicines are reviewed at length with the patient today.  Concerns regarding medicines are outlined above.  No orders of the defined types were placed in this encounter.  No orders of the defined types were placed in this encounter.   There are no Patient Instructions on file for this visit.   Signed, Meriam Sprague, MD  10/02/2020 3:44 PM    [facility]

## 2020-10-05 ENCOUNTER — Encounter: Payer: Medicaid Other | Admitting: Obstetrics

## 2020-10-05 ENCOUNTER — Other Ambulatory Visit: Payer: Medicaid Other

## 2020-10-10 ENCOUNTER — Other Ambulatory Visit: Payer: Self-pay

## 2020-10-10 ENCOUNTER — Encounter (HOSPITAL_COMMUNITY): Payer: Self-pay | Admitting: Family Medicine

## 2020-10-10 ENCOUNTER — Inpatient Hospital Stay (HOSPITAL_COMMUNITY)
Admission: AD | Admit: 2020-10-10 | Discharge: 2020-10-12 | DRG: 787 | Disposition: A | Discharge status: Home / Self Care | Payer: Medicaid Other | Attending: Obstetrics and Gynecology | Admitting: Obstetrics and Gynecology

## 2020-10-10 ENCOUNTER — Inpatient Hospital Stay (HOSPITAL_COMMUNITY): Payer: Medicaid Other | Admitting: Anesthesiology

## 2020-10-10 ENCOUNTER — Encounter (HOSPITAL_COMMUNITY)
Admission: AD | Disposition: A | Discharge status: Home / Self Care | Payer: Self-pay | Source: Home / Self Care | Attending: Obstetrics and Gynecology

## 2020-10-10 DIAGNOSIS — O42113 Preterm premature rupture of membranes, onset of labor more than 24 hours following rupture, third trimester: Secondary | ICD-10-CM

## 2020-10-10 DIAGNOSIS — Z8759 Personal history of other complications of pregnancy, childbirth and the puerperium: Secondary | ICD-10-CM | POA: Diagnosis not present

## 2020-10-10 DIAGNOSIS — O9832 Other infections with a predominantly sexual mode of transmission complicating childbirth: Secondary | ICD-10-CM | POA: Diagnosis present

## 2020-10-10 DIAGNOSIS — Z818 Family history of other mental and behavioral disorders: Secondary | ICD-10-CM | POA: Diagnosis not present

## 2020-10-10 DIAGNOSIS — F419 Anxiety disorder, unspecified: Secondary | ICD-10-CM | POA: Diagnosis present

## 2020-10-10 DIAGNOSIS — A6000 Herpesviral infection of urogenital system, unspecified: Secondary | ICD-10-CM | POA: Diagnosis present

## 2020-10-10 DIAGNOSIS — O42913 Preterm premature rupture of membranes, unspecified as to length of time between rupture and onset of labor, third trimester: Secondary | ICD-10-CM | POA: Diagnosis present

## 2020-10-10 DIAGNOSIS — O99214 Obesity complicating childbirth: Secondary | ICD-10-CM | POA: Diagnosis present

## 2020-10-10 DIAGNOSIS — K219 Gastro-esophageal reflux disease without esophagitis: Secondary | ICD-10-CM | POA: Diagnosis present

## 2020-10-10 DIAGNOSIS — O34211 Maternal care for low transverse scar from previous cesarean delivery: Secondary | ICD-10-CM | POA: Diagnosis present

## 2020-10-10 DIAGNOSIS — F410 Panic disorder [episodic paroxysmal anxiety] without agoraphobia: Secondary | ICD-10-CM | POA: Diagnosis present

## 2020-10-10 DIAGNOSIS — O99344 Other mental disorders complicating childbirth: Secondary | ICD-10-CM | POA: Diagnosis present

## 2020-10-10 DIAGNOSIS — Z3A35 35 weeks gestation of pregnancy: Secondary | ICD-10-CM

## 2020-10-10 DIAGNOSIS — O42013 Preterm premature rupture of membranes, onset of labor within 24 hours of rupture, third trimester: Secondary | ICD-10-CM

## 2020-10-10 DIAGNOSIS — O9962 Diseases of the digestive system complicating childbirth: Secondary | ICD-10-CM | POA: Diagnosis present

## 2020-10-10 DIAGNOSIS — Z98891 History of uterine scar from previous surgery: Secondary | ICD-10-CM | POA: Diagnosis not present

## 2020-10-10 DIAGNOSIS — O99345 Other mental disorders complicating the puerperium: Secondary | ICD-10-CM | POA: Diagnosis not present

## 2020-10-10 DIAGNOSIS — Z7722 Contact with and (suspected) exposure to environmental tobacco smoke (acute) (chronic): Secondary | ICD-10-CM | POA: Diagnosis present

## 2020-10-10 DIAGNOSIS — E669 Obesity, unspecified: Secondary | ICD-10-CM | POA: Diagnosis present

## 2020-10-10 HISTORY — DX: Preterm premature rupture of membranes, onset of labor more than 24 hours following rupture, third trimester: O42.113

## 2020-10-10 LAB — URINALYSIS, ROUTINE W REFLEX MICROSCOPIC
Bilirubin Urine: NEGATIVE
Glucose, UA: NEGATIVE mg/dL
Hgb urine dipstick: NEGATIVE
Ketones, ur: NEGATIVE mg/dL
Leukocytes,Ua: NEGATIVE
Nitrite: NEGATIVE
Protein, ur: 100 mg/dL — AB
Specific Gravity, Urine: 1.027 (ref 1.005–1.030)
pH: 6 (ref 5.0–8.0)

## 2020-10-10 LAB — TYPE AND SCREEN
ABO/RH(D): A POS
Antibody Screen: NEGATIVE

## 2020-10-10 LAB — CBC
HCT: 34.1 % — ABNORMAL LOW (ref 36.0–46.0)
Hemoglobin: 10.9 g/dL — ABNORMAL LOW (ref 12.0–15.0)
MCH: 23.6 pg — ABNORMAL LOW (ref 26.0–34.0)
MCHC: 32 g/dL (ref 30.0–36.0)
MCV: 74 fL — ABNORMAL LOW (ref 80.0–100.0)
Platelets: 319 10*3/uL (ref 150–400)
RBC: 4.61 MIL/uL (ref 3.87–5.11)
RDW: 17.4 % — ABNORMAL HIGH (ref 11.5–15.5)
WBC: 8.5 10*3/uL (ref 4.0–10.5)
nRBC: 0 % (ref 0.0–0.2)

## 2020-10-10 LAB — AMNISURE RUPTURE OF MEMBRANE (ROM) NOT AT ARMC: Amnisure ROM: POSITIVE

## 2020-10-10 LAB — HEPATITIS B SURFACE ANTIGEN: Hepatitis B Surface Ag: NONREACTIVE

## 2020-10-10 LAB — HEMOGLOBIN A1C
Hgb A1c MFr Bld: 5.4 % (ref 4.8–5.6)
Mean Plasma Glucose: 108.28 mg/dL

## 2020-10-10 LAB — RAPID HIV SCREEN (HIV 1/2 AB+AG)
HIV 1/2 Antibodies: NONREACTIVE
HIV-1 P24 Antigen - HIV24: NONREACTIVE

## 2020-10-10 LAB — POCT FERN TEST: POCT Fern Test: POSITIVE

## 2020-10-10 SURGERY — Surgical Case
Anesthesia: Spinal

## 2020-10-10 MED ORDER — ACETAMINOPHEN 500 MG PO TABS
1000.0000 mg | ORAL_TABLET | Freq: Four times a day (QID) | ORAL | Status: DC
  Administered 2020-10-11: 1000 mg via ORAL
  Filled 2020-10-10: qty 2

## 2020-10-10 MED ORDER — MEPERIDINE HCL 25 MG/ML IJ SOLN
6.2500 mg | INTRAMUSCULAR | Status: DC | PRN
Start: 2020-10-10 — End: 2020-10-11

## 2020-10-10 MED ORDER — DIPHENHYDRAMINE HCL 25 MG PO CAPS: 25.0000 mg | ORAL_CAPSULE | ORAL | Status: DC | PRN

## 2020-10-10 MED ORDER — ONDANSETRON HCL 4 MG/2ML IJ SOLN
INTRAMUSCULAR | Status: AC
  Filled 2020-10-10: qty 2

## 2020-10-10 MED ORDER — DEXMEDETOMIDINE (PRECEDEX) IN NS 20 MCG/5ML (4 MCG/ML) IV SYRINGE
PREFILLED_SYRINGE | INTRAVENOUS | Status: DC | PRN
  Administered 2020-10-10: 8 ug via INTRAVENOUS
  Administered 2020-10-10 (×3): 4 ug via INTRAVENOUS

## 2020-10-10 MED ORDER — OXYCODONE HCL 5 MG/5ML PO SOLN: 5.0000 mg | Freq: Once | ORAL | Status: DC | PRN

## 2020-10-10 MED ORDER — OXYCODONE HCL 5 MG PO TABS: 5.0000 mg | ORAL_TABLET | Freq: Once | ORAL | Status: DC | PRN

## 2020-10-10 MED ORDER — NALOXONE HCL 0.4 MG/ML IJ SOLN: 0.4000 mg | INTRAMUSCULAR | Status: DC | PRN

## 2020-10-10 MED ORDER — OXYTOCIN-SODIUM CHLORIDE 30-0.9 UT/500ML-% IV SOLN
INTRAVENOUS | Status: DC | PRN
  Administered 2020-10-10: 400 mL via INTRAVENOUS

## 2020-10-10 MED ORDER — CEFAZOLIN SODIUM-DEXTROSE 2-4 GM/100ML-% IV SOLN
2.0000 g | INTRAVENOUS | Status: AC
  Administered 2020-10-10: 2 g via INTRAVENOUS
  Filled 2020-10-10: qty 100

## 2020-10-10 MED ORDER — DIPHENHYDRAMINE HCL 50 MG/ML IJ SOLN: 12.5000 mg | INTRAMUSCULAR | Status: DC | PRN

## 2020-10-10 MED ORDER — NALBUPHINE HCL 10 MG/ML IJ SOLN: 5.0000 mg | Freq: Once | INTRAMUSCULAR | Status: DC | PRN

## 2020-10-10 MED ORDER — LACTATED RINGERS IV SOLN: INTRAVENOUS | Status: DC | PRN

## 2020-10-10 MED ORDER — KETOROLAC TROMETHAMINE 30 MG/ML IJ SOLN: 30.0000 mg | Freq: Once | INTRAMUSCULAR | Status: AC | PRN

## 2020-10-10 MED ORDER — FENTANYL CITRATE (PF) 100 MCG/2ML IJ SOLN
INTRAMUSCULAR | Status: DC | PRN
  Administered 2020-10-10: 15 ug via INTRATHECAL

## 2020-10-10 MED ORDER — NALOXONE HCL 4 MG/10ML IJ SOLN
1.0000 ug/kg/h | INTRAMUSCULAR | Status: DC | PRN
  Filled 2020-10-10: qty 5

## 2020-10-10 MED ORDER — SOD CITRATE-CITRIC ACID 500-334 MG/5ML PO SOLN
30.0000 mL | ORAL | Status: AC
  Administered 2020-10-10: 30 mL via ORAL
  Filled 2020-10-10: qty 15

## 2020-10-10 MED ORDER — KETOROLAC TROMETHAMINE 30 MG/ML IJ SOLN
30.0000 mg | Freq: Four times a day (QID) | INTRAMUSCULAR | Status: DC | PRN
  Administered 2020-10-11: 30 mg via INTRAVENOUS

## 2020-10-10 MED ORDER — PHENYLEPHRINE HCL-NACL 20-0.9 MG/250ML-% IV SOLN
INTRAVENOUS | Status: DC | PRN
  Administered 2020-10-10: 60 ug/min via INTRAVENOUS

## 2020-10-10 MED ORDER — DEXAMETHASONE SODIUM PHOSPHATE 4 MG/ML IJ SOLN
INTRAMUSCULAR | Status: DC | PRN
  Administered 2020-10-10: 4 mg via INTRAVENOUS

## 2020-10-10 MED ORDER — NALBUPHINE HCL 10 MG/ML IJ SOLN: 5.0000 mg | INTRAMUSCULAR | Status: DC | PRN

## 2020-10-10 MED ORDER — MORPHINE SULFATE (PF) 0.5 MG/ML IJ SOLN
INTRAMUSCULAR | Status: AC
  Filled 2020-10-10: qty 10

## 2020-10-10 MED ORDER — MIDAZOLAM HCL 2 MG/2ML IJ SOLN
INTRAMUSCULAR | Status: AC
  Filled 2020-10-10: qty 2

## 2020-10-10 MED ORDER — FENTANYL CITRATE (PF) 100 MCG/2ML IJ SOLN
INTRAMUSCULAR | Status: DC | PRN
  Administered 2020-10-10: 35 ug via INTRAVENOUS
  Administered 2020-10-10 (×2): 25 ug via INTRAVENOUS

## 2020-10-10 MED ORDER — FENTANYL CITRATE (PF) 100 MCG/2ML IJ SOLN
INTRAMUSCULAR | Status: AC
  Filled 2020-10-10: qty 2

## 2020-10-10 MED ORDER — MEPERIDINE HCL 25 MG/ML IJ SOLN: 6.2500 mg | INTRAMUSCULAR | Status: DC | PRN

## 2020-10-10 MED ORDER — HYDROMORPHONE HCL 1 MG/ML IJ SOLN: 0.2500 mg | INTRAMUSCULAR | Status: DC | PRN

## 2020-10-10 MED ORDER — NALBUPHINE HCL 10 MG/ML IJ SOLN
5.0000 mg | Freq: Once | INTRAMUSCULAR | Status: DC | PRN
Start: 2020-10-10 — End: 2020-10-12

## 2020-10-10 MED ORDER — PROMETHAZINE HCL 25 MG/ML IJ SOLN
6.2500 mg | INTRAMUSCULAR | Status: DC | PRN
  Administered 2020-10-11: 12.5 mg via INTRAVENOUS

## 2020-10-10 MED ORDER — KETOROLAC TROMETHAMINE 30 MG/ML IJ SOLN: 30.0000 mg | Freq: Four times a day (QID) | INTRAMUSCULAR | Status: DC | PRN

## 2020-10-10 MED ORDER — MIDAZOLAM HCL 2 MG/2ML IJ SOLN
INTRAMUSCULAR | Status: DC | PRN
  Administered 2020-10-10 (×2): 1 mg via INTRAVENOUS

## 2020-10-10 MED ORDER — KETOROLAC TROMETHAMINE 30 MG/ML IJ SOLN
INTRAMUSCULAR | Status: AC
  Filled 2020-10-10: qty 1

## 2020-10-10 MED ORDER — BUPIVACAINE IN DEXTROSE 0.75-8.25 % IT SOLN
INTRATHECAL | Status: DC | PRN
  Administered 2020-10-10: 1.8 mL via INTRATHECAL

## 2020-10-10 MED ORDER — DEXAMETHASONE SODIUM PHOSPHATE 4 MG/ML IJ SOLN
INTRAMUSCULAR | Status: AC
  Filled 2020-10-10: qty 1

## 2020-10-10 MED ORDER — SODIUM CHLORIDE 0.9% FLUSH: 3.0000 mL | INTRAVENOUS | Status: DC | PRN

## 2020-10-10 MED ORDER — SODIUM CHLORIDE 0.9 % IV SOLN
500.0000 mg | INTRAVENOUS | Status: AC
  Administered 2020-10-10: 500 mg via INTRAVENOUS
  Filled 2020-10-10: qty 500

## 2020-10-10 MED ORDER — OXYTOCIN-SODIUM CHLORIDE 30-0.9 UT/500ML-% IV SOLN
INTRAVENOUS | Status: AC
  Filled 2020-10-10: qty 500

## 2020-10-10 MED ORDER — PHENYLEPHRINE HCL-NACL 20-0.9 MG/250ML-% IV SOLN
INTRAVENOUS | Status: AC
  Filled 2020-10-10: qty 250

## 2020-10-10 MED ORDER — MORPHINE SULFATE (PF) 0.5 MG/ML IJ SOLN
INTRAMUSCULAR | Status: DC | PRN
  Administered 2020-10-10: .15 mg via INTRATHECAL

## 2020-10-10 MED ORDER — SCOPOLAMINE 1 MG/3DAYS TD PT72
1.0000 | MEDICATED_PATCH | Freq: Once | TRANSDERMAL | Status: DC
  Administered 2020-10-11: 1.5 mg via TRANSDERMAL

## 2020-10-10 SURGICAL SUPPLY — 35 items
APL SKNCLS STERI-STRIP NONHPOA (GAUZE/BANDAGES/DRESSINGS) ×1
BENZOIN TINCTURE PRP APPL 2/3 (GAUZE/BANDAGES/DRESSINGS) ×1 IMPLANT
CLOTH BEACON ORANGE TIMEOUT ST (SAFETY) ×2 IMPLANT
DRSG OPSITE POSTOP 4X10 (GAUZE/BANDAGES/DRESSINGS) ×2 IMPLANT
ELECT REM PT RETURN 9FT ADLT (ELECTROSURGICAL) ×2
ELECTRODE REM PT RTRN 9FT ADLT (ELECTROSURGICAL) ×1 IMPLANT
EXTRACTOR VACUUM KIWI (MISCELLANEOUS) IMPLANT
GAUZE SPONGE 4X4 12PLY STRL LF (GAUZE/BANDAGES/DRESSINGS) ×2 IMPLANT
GLOVE BIOGEL PI IND STRL 7.0 (GLOVE) ×1 IMPLANT
GLOVE BIOGEL PI INDICATOR 7.0 (GLOVE) ×1
GLOVE SURG ORTHO 8.0 STRL STRW (GLOVE) ×2 IMPLANT
GOWN STRL REUS W/TWL LRG LVL3 (GOWN DISPOSABLE) ×4 IMPLANT
HEMOSTAT ARISTA ABSORB 3G PWDR (HEMOSTASIS) ×1 IMPLANT
KIT ABG SYR 3ML LUER SLIP (SYRINGE) IMPLANT
NDL HYPO 25X5/8 SAFETYGLIDE (NEEDLE) IMPLANT
NEEDLE HYPO 25X5/8 SAFETYGLIDE (NEEDLE) IMPLANT
NS IRRIG 1000ML POUR BTL (IV SOLUTION) ×2 IMPLANT
PACK C SECTION WH (CUSTOM PROCEDURE TRAY) ×2 IMPLANT
PAD ABD 7.5X8 STRL (GAUZE/BANDAGES/DRESSINGS) ×1 IMPLANT
PAD OB MATERNITY 4.3X12.25 (PERSONAL CARE ITEMS) ×2 IMPLANT
PENCIL SMOKE EVAC W/HOLSTER (ELECTROSURGICAL) ×2 IMPLANT
RTRCTR C-SECT PINK 25CM LRG (MISCELLANEOUS) IMPLANT
SPONGE GAUZE 4X4 12PLY STER LF (GAUZE/BANDAGES/DRESSINGS) ×1 IMPLANT
STRIP CLOSURE SKIN 1/2X4 (GAUZE/BANDAGES/DRESSINGS) IMPLANT
STRIP CLOSURE SKIN 1/4X4 (GAUZE/BANDAGES/DRESSINGS) ×1 IMPLANT
SUT MON AB-0 CT1 36 (SUTURE) ×5 IMPLANT
SUT PLAIN 0 NONE (SUTURE) IMPLANT
SUT VIC AB 0 CT1 27 (SUTURE) ×4
SUT VIC AB 0 CT1 27XBRD ANBCTR (SUTURE) ×2 IMPLANT
SUT VIC AB 2-0 CT1 27 (SUTURE) ×2
SUT VIC AB 2-0 CT1 TAPERPNT 27 (SUTURE) ×1 IMPLANT
SUT VIC AB 4-0 KS 27 (SUTURE) ×2 IMPLANT
TOWEL OR 17X24 6PK STRL BLUE (TOWEL DISPOSABLE) ×2 IMPLANT
TRAY FOLEY W/BAG SLVR 14FR LF (SET/KITS/TRAYS/PACK) ×2 IMPLANT
WATER STERILE IRR 1000ML POUR (IV SOLUTION) ×2 IMPLANT

## 2020-10-10 NOTE — MAU Note (Signed)
Pt stated she started leaking fluid  About 30 min ago. Reports some mild cramping as well.Good fetal movement felt.

## 2020-10-10 NOTE — MAU Provider Note (Signed)
Event Date/Time   First Provider Initiated Contact with Melinda 10/10/20 1559       S: Melinda Jimenez is a 35 y.o. [id number] at [redacted]w[redacted]d  who presents to MAU today complaining of leaking of fluid since this morning. She denies vaginal bleeding. She feels some contractions like her stomach is balling up.   She reports normal fetal movement. Melinda has had limited prenatal care; only MAU visits this pregnancy for     O: BP 125/83   Pulse 98   Temp 98.3 F (36.8 C)   Resp 18   LMP  (LMP Unknown)  GENERAL: Well-developed, well-nourished female in no acute distress.  HEAD: Normocephalic, atraumatic.  CHEST: Normal effort of breathing, regular heart rate ABDOMEN: Soft, nontender, gravid PELVIC: Normal external female genitalia. Vagina is pink and rugated. Cervix with normal contour, no lesions. Normal discharge.  Difficult to visualize cervix but no pooling.   Cervical exam: Difficult to assess; cervix feels posterior but cannot reach os.      Fetal Monitoring: Baseline: 150 Variability: mod Accelerations: absent Decelerations: absent Contractions: irregular , q 4  Results for orders placed or performed during the hospital encounter of 10/10/20 (from the past 24 hour(s))  Amnisure rupture of membrane (rom)not at [facility]     Status: None   Collection Time: 10/10/20  4:40 PM  Result Value Ref Range   Amnisure ROM POSITIVE   Fern Test     Status: Abnormal   Collection Time: 10/10/20  5:00 PM  Result Value Ref Range   POCT Fern Test Positive = ruptured amniotic membanes      A: [facility] at [redacted]w[redacted]d  [facility]  P: Melinda is unsure about C/section; Dr. Adrian Blackwater at the bedside to discuss. Will ask Dr. Donavan Foil to assess Melinda as well.   [name], CNM 10/10/2020 5:14 PM                                         Melinda Jimenez is a 35 y.o. [id number]  at 102w0d here with complaints of vaginal discharge this morning that has been clear  and smells "antiseptic" per Melinda's partner. SHe denies bleeding, decreased fetal movements. She appears distressed and partner is answering most questions for her.   She  History     CSN: [id number]  Arrival date and time: 10/10/20 1445   Event Date/Time   First Provider Initiated Contact with Melinda 10/10/20 1559      Chief Complaint  Melinda presents with  . Rupture of Membranes   Vaginal Discharge The Melinda's primary symptoms include vaginal discharge.    OB History    Gravida  10   Para  4   Term  3   Preterm  1   AB  5   Living  3     SAB  2   IAB  3   Ectopic      Multiple  0   Live Births  4           Past Medical History:  Diagnosis Date  . Anemia   . Anxiety    off meds with preg  . Carpal tunnel syndrome   . Chlamydia   . 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 GERD (gastroesophageal reflux disease)   . Gestational diabetes  diet controlled  . Headache(784.0)   . IBS (irritable bowel syndrome)   . Infection    UTI  . Mood swings   . Panic attack   . Thyroid disease   . Trichomonas     Past Surgical History:  Procedure Laterality Date  . CESAREAN SECTION N/A 05/20/2013   Procedure: CESAREAN SECTION;  Surgeon: Brock Bad, MD;  Location: [facility];  Service: Obstetrics;  Laterality: N/A;  . CESAREAN SECTION N/A 04/06/2017   Procedure: CESAREAN SECTION;  Surgeon: Hermina Staggers, MD;  Location: [facility] BIRTH[facility] SUIT[facility];  Service: Obstetrics;  Laterality: N/A;  . INDUCED ABORTION  2009  . VAGINAL DELIVERY  2007    Family History  Problem Relation Age of Onset  . Depression Mother   . Anxiety disorder Mother   . Breast cancer Other   . Bipolar disorder Brother   . Diabetes Maternal Grandmother   . Cancer Maternal Grandmother   . Hypertension Maternal Grandmother   . Hypertension Paternal Grandmother   . Autism Other   . Schizophrenia Other     Social History   Tobacco Use  . Smoking  status: Passive Smoke Exposure - Never Smoker  . Smokeless tobacco: Never Used  . Tobacco comment: was not a daily smoker, tried it  Vaping Use  . Vaping Use: Never used  Substance Use Topics  . Alcohol use: No    Comment: Twice a month  . Drug use: No    Allergies:  Allergies  Allergen Reactions  . Zofran [Ondansetron Hcl] Nausea And Vomiting  . Latex Itching    Medications Prior to Admission  Medication Sig Dispense Refill Last Dose  . Prenatal Vit-Fe Fumarate-FA (PREPLUS) 27-1 MG TABS Take 1 tablet by mouth daily. 30 tablet 13 10/09/2020 at Unknown time  . acetaminophen (TYLENOL) 500 MG tablet Take 500 mg by mouth every 6 (six) hours as needed.     . famotidine (PEPCID) 20 MG tablet Take 1 tablet (20 mg total) by mouth 2 (two) times daily. 60 tablet 1   . guaifenesin (ROBITUSSIN) 100 MG/5ML syrup Take 200 mg by mouth 3 (three) times daily as needed for cough.     . promethazine (PHENERGAN) 25 MG tablet Take 1 tablet (25 mg total) by mouth every 6 (six) hours as needed for nausea or vomiting. 30 tablet 1     Review of Systems  Constitutional: Negative.   HENT: Negative.   Respiratory: Negative.   Genitourinary: Positive for vaginal discharge.  Musculoskeletal: Negative.   Neurological: Negative.   Hematological: Negative.   Psychiatric/Behavioral: Negative.    Physical Exam   Blood pressure 125/83, pulse 98, temperature 98.3 F (36.8 C), resp. rate 18, unknown if currently breastfeeding.  Physical Exam Constitutional:      Appearance: Normal appearance.  Musculoskeletal:        General: Normal range of motion.  Skin:    General: Skin is warm.  Neurological:     Mental Status: She is alert.     MAU Course  Procedures  MDM -NST: 150 bpm, mod var, present acel, no decels,  -Negative pooling, but difficult to open speculum exam. Melinda also did not tolerate vaginal exams; can feel cervix and it feels posterior but   Assessment and Plan  -Dr. Adrian Blackwater at  bedside to assess Melinda and discuss options.    Charlesetta Garibaldi Max Nuno 10/10/2020, 4:55 PM

## 2020-10-10 NOTE — Anesthesia Procedure Notes (Signed)
Spinal  Patient location during procedure: OR Start time: 10/10/2020 10:27 PM End time: 10/10/2020 10:34 PM Staffing Performed: anesthesiologist  Anesthesiologist: [name], DO Preanesthetic Checklist Completed: patient identified, IV checked, risks and benefits discussed, surgical consent, monitors and equipment checked, pre-op evaluation and timeout performed Spinal Block Patient position: sitting Prep: DuraPrep and site prepped and draped Patient monitoring: cardiac monitor, continuous pulse ox and blood pressure Approach: midline Location: L3-4 Injection technique: single-shot Needle Needle type: Pencan  Needle gauge: 24 G Needle length: 9 cm Assessment Sensory level: T6 Additional Notes Extremely uncooperative for spinal, moving around the entire time. Recommend sedation prior to spinal next time.

## 2020-10-10 NOTE — Transfer of Care (Signed)
Immediate Anesthesia Transfer of Care Note  Patient: Melinda Jimenez  Procedure(s) Performed: CESAREAN SECTION  Patient Location: PACU  Anesthesia Type:Spinal  Level of Consciousness: awake, alert  and patient cooperative  Airway & Oxygen Therapy: Patient Spontanous Breathing  Post-op Assessment: Report given to RN and Post -op Vital signs reviewed and stable  Post vital signs: Reviewed and stable  Last Vitals:  Vitals Value Taken Time  BP 125/73 10/10/20 2353  Temp    Pulse 87 10/10/20 2356  Resp 15 10/10/20 2356  SpO2 98 % 10/10/20 2356  Vitals shown include unvalidated device data.  Last Pain: There were no vitals filed for this visit.       Complications: No complications documented.

## 2020-10-10 NOTE — Discharge Summary (Addendum)
Postpartum Discharge Summary  Patient Name: Melinda Jimenez DOB: November 16, 1985 MRN: [id number]  Date of admission: 10/10/2020 Delivery date:10/10/2020  Delivering provider: Griffin Basil  Date of discharge: 10/12/2020  Admitting diagnosis: Premature rupture of membranes (PROM), onset of labor after 24 hours, antepartum, preterm, third trimester [O42.113] Cesarean delivery delivered [O82] Intrauterine pregnancy: [redacted]w[redacted]d    Secondary diagnosis:  Principal Problem:   Cesarean delivery delivered Active Problems:   Premature rupture of membranes (PROM), onset of labor after 24 hours, antepartum, preterm, third trimester   Anxiety  Additional problems: H/o psychosis during this pregnancy, no prenatal care   Discharge diagnosis: Preterm Pregnancy Delivered                                              Post partum procedures: none Augmentation: N/A Complications: None  Hospital course: Sceduled C/S   35y.o. yo [id number] 369w0das admitted to the hospital 10/10/2020 for scheduled cesarean section with the following indication:Elective Repeat and PPROM .Delivery details are as follows:  Membrane Rupture Time/Date: 11:00 PM ,10/10/2020   Delivery Method:C-Section, Low Transverse  Details of operation can be found in separate operative note.  Patient had a postpartum course complicated by concern for mental health. Patient has a h/o anxiety, panic attacks, and brief psychotic episodes, most recently in July 2021. She was evaluated by psychiatry while admitted and is appropriate for discharge, [facility]  She is ambulating, tolerating a regular diet, passing flatus, and urinating well. Patient is discharged home in stable condition on  10/12/20        Newborn Data: Birth date:10/10/2020  Birth time:11:01 PM  Gender:Female  Living status:Living  Apgars:8 ,9  Weight:2285 g     Magnesium Sulfate received: No BMZ received: No Rhophylac:N/A MMR:N/A T-DaP: no  pnc Flu: no pnc  Transfusion:No  Physical exam  Vitals:   10/11/20 1438 10/11/20 1816 10/11/20 2148 10/12/20 0517  BP: 127/82 120/76 120/85 114/79  Pulse: 80 80 85 80  Resp: _0 Temp: 97.8 F (36.6 C) (!) 97.5 F (36.4 C) 97.6 F (36.4 C) 98.6 F (37 C)  TempSrc: Oral Oral Oral Oral  SpO2: 100% 100% 100% 99%  Weight:       General: alert, cooperative and no distress Lochia: appropriate Uterine Fundus: firm Incision: Healing well with no significant drainage, No significant erythema, Dressing is clean, dry, and intact DVT Evaluation: No evidence of DVT seen on physical exam. Labs: Lab Results  Component Value Date   WBC 11.2 (H) 10/11/2020   HGB 9.7 (L) 10/11/2020   HCT 31.4 (L) 10/11/2020   MCV 75.1 (L) 10/11/2020   PLT 254 10/11/2020   CMP Latest Ref Rng & Units 10/11/2020  Glucose 70 - 99 mg/dL -  BUN 6 - 20 mg/dL -  Creatinine 0.44 - 1.00 mg/dL 0.56  Sodium 135 - 145 mmol/L -  Potassium 3.5 - 5.1 mmol/L -  Chloride 98 - 111 mmol/L -  CO2 22 - 32 mmol/L -  Calcium 8.9 - 10.3 mg/dL -  Total Protein 6.5 - 8.1 g/dL -  Total Bilirubin 0.3 - 1.2 mg/dL -  Alkaline Phos 38 - 126 U/L -  AST 15 - 41 U/L -  ALT 0 - 44 U/L -   Edinburgh Score: EdFlavia Shipperostnatal  Depression Scale Screening Tool 10/11/2020  I have been able to laugh and see the funny side of things. (No Data)     After visit meds:  Allergies as of 10/12/2020       Reactions   Latex Itching        Medication List     STOP taking these medications    famotidine 20 MG tablet Commonly known as: PEPCID       TAKE these medications    acetaminophen 325 MG tablet Commonly known as: TYLENOL Take 2 tablets (650 mg total) by mouth every 4 (four) hours as needed for mild pain, moderate pain, fever or headache (pain >4, fever >101.5*F). What changed:  medication strength how much to take when to take this reasons to take this   guaifenesin 100 MG/5ML syrup Commonly known as:  ROBITUSSIN Take 200 mg by mouth 3 (three) times daily as needed for cough.   ibuprofen 800 MG tablet Commonly known as: ADVIL Take 1 tablet (800 mg total) by mouth every 6 (six) hours.   norelgestromin-ethinyl estradiol 150-35 MCG/24HR transdermal patch Commonly known as: ORTHO EVRA Place 1 patch onto the skin once a week.   oxyCODONE 5 MG immediate release tablet Commonly known as: Oxy IR/ROXICODONE Take 1-2 tablets (5-10 mg total) by mouth every 4 (four) hours as needed for moderate pain.   PrePLUS 27-1 MG Tabs Take 1 tablet by mouth daily.   promethazine 25 MG tablet Commonly known as: PHENERGAN Take 1 tablet (25 mg total) by mouth every 6 (six) hours as needed for nausea or vomiting.               Discharge Care Instructions  (From admission, onward)           Start     Ordered   10/12/20 0000  If the dressing is still on your incision site when you go home, remove it on the third day after your surgery date. Remove dressing if it begins to fall off, or if it is dirty or damaged before the third day.       Comments: If incision is opening up, red, hot to touch, or pustulant drainage, call doctor   10/12/20 1510             Discharge home in stable condition Infant Feeding: Bottle Infant Disposition:NICU Discharge instruction: per After Visit Summary and Postpartum booklet. Activity: Advance as tolerated. Pelvic rest for 6 weeks.  Diet: routine diet Future Appointments: Future Appointments  Date Time Provider [place]  10/14/2020  8:00 AM Freada Bergeron, MD [facility]  10/18/2020  2:45 PM Lynnea Ferrier, LCSW [facility] None  11/08/2020 10:00 AM Griffin Basil, MD [facility] None   Follow up Visit:    Please schedule this patient for a In person postpartum visit in 4 weeks with the following provider: Any provider. Additional Postpartum F/U:Postpartum Depression checkup one week  High risk pregnancy complicated by:   significant psychosocial history, no PNC Delivery mode:  C-Section, Low Transverse  Anticipated Birth Control:  Unsure- ortho evra patch to start in 4 weeks   10/12/2020 Gladys Damme, MD  I spoke with and examined patient and agree with resident/PA-S/MS/SNM's note and plan of care. Pt tearful on exam, keeps stating she wants to go home to see her children who are currently with her brother. Baby is currently in NICU. Nurses report pt not communicating w/ them, pulls sheets over her head. Discussed w/ oncoming team, will get  inpatient psych eval prior to d/c.  Roma Schanz, CNM, [facility] 10/18/2020 5:20 PM

## 2020-10-10 NOTE — H&P (Addendum)
OBSTETRIC ADMISSION HISTORY AND PHYSICAL  Melinda Jimenez is a 35 y.o. female [phone] with IUP at [redacted]w[redacted]d by 6 week u/s  presenting for premature rupture of membranes  and history of two previous c sections.  The patient has received no prenatal care other than sporadic MAU visits.  PROM verified by fern and amnisure,  She reports +FMs, some LOF, no VB, no blurry vision, headaches or peripheral edema, and RUQ pain.  She received no prenatal care.  The patient has a history of gestational diabetes and HSV infection.  There has been no recent outbreaks per history.  Dating: By 6.1 week ultrasound --->  Estimated Date of Delivery: 11/14/20    Prenatal History/Complications: no prenatal care in third trimester, previous cesarean section x 2  Past Medical History: Past Medical History:  Diagnosis Date  . Anemia   . Anxiety    off meds with preg  . Carpal tunnel syndrome   . Chlamydia   . Genital HSV    Last OB: > 28yr ago; Valtrex 500 mg po prn outbreaks  . GERD (gastroesophageal reflux disease)   . Gestational diabetes    diet controlled  . Headache(784.0)   . IBS (irritable bowel syndrome)   . Infection    UTI  . Mood swings   . Panic attack   . Thyroid disease   . Trichomonas     Past Surgical History: Past Surgical History:  Procedure Laterality Date  . CESAREAN SECTION N/A 05/20/2013   Procedure: CESAREAN SECTION;  Surgeon: Brock Bad, MD;  Location: [facility];  Service: Obstetrics;  Laterality: N/A;  . CESAREAN SECTION N/A 04/06/2017   Procedure: CESAREAN SECTION;  Surgeon: Hermina Staggers, MD;  Location: [facility];  Service: Obstetrics;  Laterality: N/A;  . INDUCED ABORTION  2009  . VAGINAL DELIVERY  2007    Obstetrical History: OB History    Gravida  10   Para  4   Term  3   Preterm  1   AB  5   Living  3     SAB  2   IAB  3   Ectopic      Multiple  0   Live Births  4           Social History Social History   Socioeconomic History   . Marital status: Single    Spouse name: Not on file  . Number of children: 1  . Years of education: Not on file  . Highest education level: Not on file  Occupational History  . Occupation: Unemployed  Tobacco Use  . Smoking status: Passive Smoke Exposure - Never Smoker  . Smokeless tobacco: Never Used  . Tobacco comment: was not a daily smoker, tried it  Vaping Use  . Vaping Use: Never used  Substance and Sexual Activity  . Alcohol use: No    Comment: Twice a month  . Drug use: No  . Sexual activity: Yes    Birth control/protection: None    Comment: Patient is interested in implant  Other Topics Concern  . Not on file  Social History Narrative  . Not on file   Social Determinants of Health   Financial Resource Strain: Not on file  Food Insecurity: Not on file  Transportation Needs: Not on file  Physical Activity: Not on file  Stress: Not on file  Social Connections: Not on file    Family History: Family History  Problem Relation Age of Onset  .  Depression Mother   . Anxiety disorder Mother   . Breast cancer Other   . Bipolar disorder Brother   . Diabetes Maternal Grandmother   . Cancer Maternal Grandmother   . Hypertension Maternal Grandmother   . Hypertension Paternal Grandmother   . Autism Other   . Schizophrenia Other     Allergies: Allergies  Allergen Reactions  . Zofran [Ondansetron Hcl] Nausea And Vomiting  . Latex Itching    Medications Prior to Admission  Medication Sig Dispense Refill Last Dose  . Prenatal Vit-Fe Fumarate-FA (PREPLUS) 27-1 MG TABS Take 1 tablet by mouth daily. 30 tablet 13 10/09/2020 at Unknown time  . acetaminophen (TYLENOL) 500 MG tablet Take 500 mg by mouth every 6 (six) hours as needed.     . famotidine (PEPCID) 20 MG tablet Take 1 tablet (20 mg total) by mouth 2 (two) times daily. 60 tablet 1   . guaifenesin (ROBITUSSIN) 100 MG/5ML syrup Take 200 mg by mouth 3 (three) times daily as needed for cough.     . promethazine  (PHENERGAN) 25 MG tablet Take 1 tablet (25 mg total) by mouth every 6 (six) hours as needed for nausea or vomiting. 30 tablet 1      Review of Systems   All systems reviewed and negative except as stated in HPI  Blood pressure 125/83, pulse 98, temperature 98.3 F (36.8 C), resp. rate 18, unknown if currently breastfeeding. General appearance: alert, cooperative, no distress and mildly obese Lungs: clear to auscultation bilaterally Heart: regular rate and rhythm Abdomen: soft, non-tender; bowel sounds normal, c/s scar well healed. Pelvic: deferred Extremities: Homans sign is negative, no sign of DVT Presentation: cephalic Fetal monitoringBaseline: 150s bpm, Variability: moderate, Accelerations: Non-reactive but appropriate for gestational age and Decelerations: Absent Uterine activityFrequency: Every 2-3 minutes     Prenatal labs  Will order prenatal panel ABO, Rh:   Antibody:   Rubella:   RPR: NON REACTIVE (07/04 1521)  HBsAg:    HIV: NON REACTIVE, Non Reactive (07/04 1521)  GBS:      Prenatal Transfer Tool    Results for orders placed or performed during the hospital encounter of 10/10/20 (from the past 24 hour(s))  Urinalysis, Routine w reflex microscopic Urine, Clean Catch   Collection Time: 10/10/20  4:40 PM  Result Value Ref Range   Color, Urine YELLOW YELLOW   APPearance CLEAR CLEAR   Specific Gravity, Urine 1.027 1.005 - 1.030   pH 6.0 5.0 - 8.0   Glucose, UA NEGATIVE NEGATIVE mg/dL   Hgb urine dipstick NEGATIVE NEGATIVE   Bilirubin Urine NEGATIVE NEGATIVE   Ketones, ur NEGATIVE NEGATIVE mg/dL   Protein, ur 768 (A) NEGATIVE mg/dL   Nitrite NEGATIVE NEGATIVE   Leukocytes,Ua NEGATIVE NEGATIVE   RBC / HPF 0-5 0 - 5 RBC/hpf   WBC, UA 0-5 0 - 5 WBC/hpf   Bacteria, UA RARE (A) NONE SEEN   Squamous Epithelial / LPF 0-5 0 - 5   Mucus PRESENT   Amnisure rupture of membrane (rom)not at [facility]   Collection Time: 10/10/20  4:40 PM  Result Value Ref Range   Amnisure  ROM POSITIVE   Fern Test   Collection Time: 10/10/20  5:00 PM  Result Value Ref Range   POCT Fern Test Positive = ruptured amniotic membanes     Patient Active Problem List   Diagnosis Date Noted  . Premature rupture of membranes (PROM), onset of labor after 24 hours, antepartum, preterm, third trimester 10/10/2020  . Supervision  of normal pregnancy, antepartum 05/11/2020  . Brief psychotic disorder (HCC) 04/03/2020  . Intrauterine pregnancy 03/22/2020  . H/O: cesarean section 09/27/2016  . GERD without esophagitis 01/20/2013  . CARPAL TUNNEL SYNDROME 06/02/2010  . Vitamin D deficiency 04/19/2010  . Herpes genitalis in women 04/18/2010  . Goiter, unspecified 04/18/2010    Assessment/Plan:  Melinda Jimenez is a 35 y.o. [id number] at [redacted]w[redacted]d here for premature rupture of membranes and history of previous c section x 2.  There has been no prenatal care.  Pt understands the need for repeat cesarean section.  Prenatal panel and UDS will be ordered  The risks of surgery were discussed with the patient including but were not limited to: bleeding which may require transfusion or reoperation; infection which may require antibiotics; injury to bowel, bladder, ureters or other surrounding organs; injury to the fetus; need for additional procedures including hysterectomy in the event of a life-threatening hemorrhage; formation of adhesions; placental abnormalities wth subsequent pregnancies; incisional problems; thromboembolic phenomenon and other postoperative/anesthesia complications.  The patient concurred with the proposed plan, giving informed written consent for the procedure.   Patient has been NPO since 1300 she will remain NPO for procedure. Anesthesia and OR aware. Preoperative prophylactic antibiotics and SCDs ordered on call to the OR.  Neonatologist has been notified as well. To OR when ready.  She will need a social work consult after delivery.   Warden Fillers, MD  10/10/2020, 6:51  PM

## 2020-10-10 NOTE — Op Note (Signed)
Operative Note   SURGERY DATE: 10/10/2020  PRE-OP DIAGNOSIS:  *Pregnancy at [redacted]w[redacted]d *History of two prior CS *PROM   POST-OP DIAGNOSIS: Same   PROCEDURE: repeat low transverse cesarean section via pfannenstiel skin incision with double layer uterine closure  SURGEON: Surgeon(s) and Role:    Warden Fillers, MD - Primary    * Myriam Jacobson Arlana Pouch, MD - Fellow  ANESTHESIA: spinal  ESTIMATED BLOOD LOSS: 449  DRAINS: UOP via indwelling foley  TOTAL IV FLUIDS: crystalloid  VTE PROPHYLAXIS: SCDs to bilateral lower extremities  ANTIBIOTICS: Two grams of Cefazolin were given., within 1 hour of skin incision  SPECIMENS: placenta to path   COMPLICATIONS: none  INDICATIONS: history of two prior CS   FINDINGS: No intra-abdominal adhesions were noted. Grossly normal uterus, tubes and ovaries. clear amniotic fluid, cephalic female infant, weight pending, APGARs 8/9, intact placenta.  PROCEDURE IN DETAIL: The patient was taken to the operating room where anesthesia was administered and normal fetal heart tones were confirmed. She was then prepped and draped in the normal fashion in the dorsal supine position with a leftward tilt.  After a time out was performed, a pfannensteil skin incision was made with the scalpel and carried through to the underlying layer of fascia. The fascia was then incised at the midline and this incision was extended laterally with the mayo scissors. Attention was turned to the superior aspect of the fascial incision which was grasped with the kocher clamps x 2, tented up and the rectus muscles were dissected off with the scalpel. In a similar fashion the inferior aspect of the fascial incision was grasped with the kocher clamps, tented up and the rectus muscles dissected off with the mayo scissors. The rectus muscles were then separated in the midline and the peritoneum was entered bluntly. The Alexis retractor was inserted.   A low transverse hysterotomy  was made with the scalpel until the endometrial cavity was breached and the amniotic sac ruptured with the Allis clamp, yielding clear amniotic fluid. This incision was extended bluntly and the infant's head, shoulders and body were delivered atraumatically.The cord was clamped x 2 and cut, and the infant was handed to the awaiting pediatricians, after delayed cord clamping was done.  The placenta was then gradually expressed from the uterus and then the uterus was exteriorized and cleared of all clots and debris. The hysterotomy was repaired with a running suture of 0 Monocryl. A second imbricating layer of 0 Monocryl suture was then placed. Several figure-of-eight sutures of 0 Monocryl were added to achieve excellent hemostasis.    The hysterotomy and all operative sites were reinspected and excellent hemostasis was noted after irrigation and suction of the abdomen with warm saline.  The peritoneum was closed with a running stitch of 3-0 Vicryl. Arrista was placed on rectus muscles. The fascia was reapproximated with 0 Vicryl in a simple running fashion bilaterally. The subcutaneous layer was then reapproximated with interrupted sutures of 2-0 plain gut, and the skin was then closed with 4-0 vicryl on a Keith needle, in a subcuticular fashion.  The patient  tolerated the procedure well. Sponge, lap, needle, and instrument counts were correct x 2. The patient was transferred to the recovery room awake, alert and breathing independently in stable condition.   Casper Harrison, MD [facility] Fellow, [facility] for [facility], [facility]

## 2020-10-10 NOTE — Anesthesia Preprocedure Evaluation (Addendum)
Anesthesia Evaluation  Patient identified by MRN, date of birth, ID band Patient awake    Reviewed: Allergy & Precautions, NPO status , Patient's Chart, lab work & pertinent test results  Airway Mallampati: IV  TM Distance: >3 FB Neck ROM: Full  Mouth opening: Limited Mouth Opening Comment: Very small mouth opening, unsure if this is 2/2 poor patient cooperation Dental  (+) Dental Advisory Given, Teeth Intact   Pulmonary  COVID + 09/12/20   Pulmonary exam normal breath sounds clear to auscultation       Cardiovascular negative cardio ROS Normal cardiovascular exam Rhythm:Regular Rate:Normal     Neuro/Psych  Headaches, PSYCHIATRIC DISORDERS Anxiety Schizophrenia    GI/Hepatic Neg liver ROS, GERD  Medicated and Controlled,  Endo/Other  diabetes, GestationalObesity BMI 32  Renal/GU negative Renal ROS  negative genitourinary   Musculoskeletal negative musculoskeletal ROS (+)   Abdominal (+) + obese,   Peds  Hematology negative hematology ROS (+)   Anesthesia Other Findings   Reproductive/Obstetrics 2 prior sections, presented ruptured                          Anesthesia Physical Anesthesia Plan  ASA: III  Anesthesia Plan: Spinal   Post-op Pain Management:    Induction:   PONV Risk Score and Plan: 3 and Ondansetron, Dexamethasone and Treatment may vary due to age or medical condition  Airway Management Planned: Natural Airway and Nasal Cannula  Additional Equipment: None  Intra-op Plan:   Post-operative Plan:   Informed Consent: I have reviewed the patients History and Physical, chart, labs and discussed the procedure including the risks, benefits and alternatives for the proposed anesthesia with the patient or authorized representative who has indicated his/her understanding and acceptance.       Plan Discussed with: CRNA  Anesthesia Plan Comments: (Pt refusing to let anyone  besides IV team replace her IV. Also refuses to do spinal without FOB in room; states this has been done for her before. Minimally engaged throughout the entire interview process. )       Anesthesia Quick Evaluation

## 2020-10-11 ENCOUNTER — Encounter (HOSPITAL_COMMUNITY): Payer: Self-pay | Admitting: Obstetrics and Gynecology

## 2020-10-11 DIAGNOSIS — Z98891 History of uterine scar from previous surgery: Secondary | ICD-10-CM

## 2020-10-11 LAB — CBC
HCT: 31.4 % — ABNORMAL LOW (ref 36.0–46.0)
Hemoglobin: 9.7 g/dL — ABNORMAL LOW (ref 12.0–15.0)
MCH: 23.2 pg — ABNORMAL LOW (ref 26.0–34.0)
MCHC: 30.9 g/dL (ref 30.0–36.0)
MCV: 75.1 fL — ABNORMAL LOW (ref 80.0–100.0)
Platelets: 254 10*3/uL (ref 150–400)
RBC: 4.18 MIL/uL (ref 3.87–5.11)
RDW: 17.2 % — ABNORMAL HIGH (ref 11.5–15.5)
WBC: 11.2 10*3/uL — ABNORMAL HIGH (ref 4.0–10.5)
nRBC: 0 % (ref 0.0–0.2)

## 2020-10-11 LAB — GLUCOSE, CAPILLARY: Glucose-Capillary: 158 mg/dL — ABNORMAL HIGH (ref 70–99)

## 2020-10-11 LAB — CREATININE, SERUM
Creatinine, Ser: 0.56 mg/dL (ref 0.44–1.00)
GFR, Estimated: >60 mL/min (ref >60)

## 2020-10-11 MED ORDER — LACTATED RINGERS IV SOLN: INTRAVENOUS | Status: DC

## 2020-10-11 MED ORDER — SIMETHICONE 80 MG PO CHEW
80.0000 mg | CHEWABLE_TABLET | Freq: Three times a day (TID) | ORAL | Status: DC
  Administered 2020-10-11: 80 mg via ORAL
  Filled 2020-10-11 (×3): qty 1

## 2020-10-11 MED ORDER — PROMETHAZINE HCL 25 MG/ML IJ SOLN
INTRAMUSCULAR | Status: AC
  Filled 2020-10-11: qty 1

## 2020-10-11 MED ORDER — DIBUCAINE (PERIANAL) 1 % EX OINT: 1.0000 "application " | TOPICAL_OINTMENT | CUTANEOUS | Status: DC | PRN

## 2020-10-11 MED ORDER — WITCH HAZEL-GLYCERIN EX PADS: 1.0000 "application " | MEDICATED_PAD | CUTANEOUS | Status: DC | PRN

## 2020-10-11 MED ORDER — COCONUT OIL OIL: 1.0000 "application " | TOPICAL_OIL | Status: DC | PRN

## 2020-10-11 MED ORDER — DEXMEDETOMIDINE (PRECEDEX) IN NS 20 MCG/5ML (4 MCG/ML) IV SYRINGE
PREFILLED_SYRINGE | INTRAVENOUS | Status: AC
  Filled 2020-10-11: qty 5

## 2020-10-11 MED ORDER — IBUPROFEN 800 MG PO TABS
800.0000 mg | ORAL_TABLET | Freq: Four times a day (QID) | ORAL | Status: DC
  Administered 2020-10-11 – 2020-10-12 (×4): 800 mg via ORAL
  Filled 2020-10-11 (×4): qty 1

## 2020-10-11 MED ORDER — ENOXAPARIN SODIUM 60 MG/0.6ML ~~LOC~~ SOLN: 0.5000 mg/kg | SUBCUTANEOUS | Status: DC

## 2020-10-11 MED ORDER — TETANUS-DIPHTH-ACELL PERTUSSIS 5-2.5-18.5 LF-MCG/0.5 IM SUSY: 0.5000 mL | PREFILLED_SYRINGE | Freq: Once | INTRAMUSCULAR | Status: DC

## 2020-10-11 MED ORDER — ZOLPIDEM TARTRATE 5 MG PO TABS: 5.0000 mg | ORAL_TABLET | Freq: Every evening | ORAL | Status: DC | PRN

## 2020-10-11 MED ORDER — SIMETHICONE 80 MG PO CHEW
80.0000 mg | CHEWABLE_TABLET | ORAL | Status: DC | PRN
  Administered 2020-10-11: 80 mg via ORAL
  Filled 2020-10-11: qty 1

## 2020-10-11 MED ORDER — ACETAMINOPHEN 500 MG PO TABS
1000.0000 mg | ORAL_TABLET | Freq: Three times a day (TID) | ORAL | Status: DC
  Administered 2020-10-11: 1000 mg via ORAL
  Filled 2020-10-11 (×3): qty 2

## 2020-10-11 MED ORDER — SENNOSIDES-DOCUSATE SODIUM 8.6-50 MG PO TABS
2.0000 | ORAL_TABLET | ORAL | Status: DC
  Administered 2020-10-11: 2 via ORAL
  Filled 2020-10-11 (×3): qty 2

## 2020-10-11 MED ORDER — OXYCODONE HCL 5 MG PO TABS
5.0000 mg | ORAL_TABLET | ORAL | Status: DC | PRN
  Administered 2020-10-12: 10 mg via ORAL
  Filled 2020-10-11: qty 2

## 2020-10-11 MED ORDER — PRENATAL MULTIVITAMIN CH
1.0000 | ORAL_TABLET | Freq: Every day | ORAL | Status: DC
  Filled 2020-10-11: qty 1

## 2020-10-11 MED ORDER — MENTHOL 3 MG MT LOZG: 1.0000 | LOZENGE | OROMUCOSAL | Status: DC | PRN

## 2020-10-11 MED ORDER — MEASLES, MUMPS & RUBELLA VAC IJ SOLR: 0.5000 mL | Freq: Once | INTRAMUSCULAR | Status: DC

## 2020-10-11 MED ORDER — KETOROLAC TROMETHAMINE 30 MG/ML IJ SOLN
30.0000 mg | Freq: Four times a day (QID) | INTRAMUSCULAR | Status: AC
  Administered 2020-10-11: 30 mg via INTRAVENOUS
  Filled 2020-10-11 (×2): qty 1

## 2020-10-11 MED ORDER — SCOPOLAMINE 1 MG/3DAYS TD PT72
MEDICATED_PATCH | TRANSDERMAL | Status: AC
  Filled 2020-10-11: qty 1

## 2020-10-11 MED ORDER — DIPHENHYDRAMINE HCL 25 MG PO CAPS
25.0000 mg | ORAL_CAPSULE | Freq: Four times a day (QID) | ORAL | Status: DC | PRN
Start: 2020-10-11 — End: 2020-10-12

## 2020-10-11 MED ORDER — OXYTOCIN-SODIUM CHLORIDE 30-0.9 UT/500ML-% IV SOLN: 2.5000 [IU]/h | INTRAVENOUS | Status: AC

## 2020-10-11 NOTE — Anesthesia Postprocedure Evaluation (Signed)
Anesthesia Post Note  Patient: Melinda Jimenez  Procedure(s) Performed: CESAREAN SECTION     Patient location during evaluation: PACU Anesthesia Type: Spinal Level of consciousness: oriented and awake and alert Pain management: pain level controlled Vital Signs Assessment: post-procedure vital signs reviewed and stable Respiratory status: spontaneous breathing and respiratory function stable Cardiovascular status: blood pressure returned to baseline and stable Postop Assessment: no headache, no backache, no apparent nausea or vomiting, spinal receding and patient able to bend at knees Anesthetic complications: no   No complications documented.  Last Vitals:  Vitals:   10/11/20 0100 10/11/20 0121  BP: 120/78 121/76  Pulse: 79 77  Resp: 14 18  Temp: 36.7 C 36.6 C  SpO2: 100% 100%    Last Pain:  Vitals:   10/11/20 0121  TempSrc: Oral  PainSc: 0-No pain   Pain Goal:                   [name]

## 2020-10-11 NOTE — Progress Notes (Signed)
POSTPARTUM PROGRESS NOTE  Subjective: Melinda Jimenez is a 35 y.o. [id number] s/p rLTCS at [redacted]w[redacted]d.  She reports she doing well. No acute events overnight. She denies any problems with po intake. Has not ambulated, foley still in place. Denies nausea or vomiting. She has not passed flatus. Pain is well controlled.  Lochia is moderate.  Objective: Blood pressure 127/80, pulse 78, temperature 98 F (36.7 C), temperature source Oral, resp. rate 18, weight 92.2 kg, SpO2 100 %, unknown if currently breastfeeding.  Physical Exam:  General: alert, cooperative and no distress Chest: no respiratory distress Abdomen: soft, non-tender  Uterine Fundus: firm and at level of umbilicus Extremities: No calf swelling or tenderness  no edema Psych: paranoid affect but appears appropriate with newborn   Recent Labs    10/10/20 1936 10/11/20 0525  HGB 10.9* 9.7*  HCT 34.1* 31.4*    Assessment/Plan: Melinda Jimenez is a 35 y.o. [id number] s/p rLTCS at [redacted]w[redacted]d for PROM, hx of 2 prior CS. POD#1  Routine Postpartum Care: Doing well, pain well-controlled.  -- Continue routine care, lactation support  -- Hgb 10.9 > 9.7  -- Contraception: considering depo, however declined administration here today. Will readress tomorrow. --Significant Psychosocial History, hx depression/anxiety: social work consult. Consider psych consult.   -- Feeding: bottle     Dispo: Plan for discharge POD2 or 3.  Gita Kudo, MD OB Fellow, [facility] 10/11/2020 6:36 AM

## 2020-10-11 NOTE — Lactation Note (Signed)
This note was copied from a baby's chart. Lactation Consultation Note  Patient Name: Melinda Jimenez [id number] Date: 10/11/2020   Age:33 hours  LC attempted to see mom at bedside. RN reports that mom did come in as breastmilk and formula but as far as she knows has not been pumping or asked about pumping.  Mom in NICU with baby.  LC visited with mom in dad at baby Melinda Hopes bedside.  Mom reports she does not plan to pump.  However dad reports he thought she was going to.  Dad reports they will talk and let LC know if mom decided to pump.  Urged mom to consider just pumping while she was here in the hospital even if she did not want to breastfeed when they went home.  Parents to call lactation as needed.  Consult Status      Ladina Shutters ERIANA SULIMAN 10/11/2020, 9:34 PM

## 2020-10-11 NOTE — Clinical Social Work Maternal (Signed)
CLINICAL SOCIAL WORK MATERNAL/CHILD NOTE  Patient Details  Name: Tammy Sours MRN: [id number] Date of Birth: 03/07/1986  Date:  04/09/2021  Clinical Social Worker Initiating Note:  Darra Lis, MSW, [place] Date/Time: Initiated:  10/11/20/0900     Child's Name:  Mariea Clonts   Biological Parents:  Mother,Father Vonna Kotyk [place] 07/23/1989)   Need for Interpreter:  None   Reason for Referral:  Late or No Prenatal Care ,Behavioral Health Concerns   Address:  [place] [address]    Phone number:  [phone] (home)     Additional phone number:   Household Members/Support Persons (HM/SP):   Household Member/Support Person 1,Household Member/Support Person 2,Household Member/Support Person 3   HM/SP Name Relationship DOB or Age  HM/SP -1 Zamarion Tilman Neat 08/27/2006  HM/SP -2 Doria Fern Daughter 04/06/2017  HM/SP -3 Jhayce Brinley Son 05/20/2013  HM/SP -4        HM/SP -5        HM/SP -6        HM/SP -7        HM/SP -8          Natural Supports (not living in the home):  Immediate Family   Professional Supports: None   Employment: Unemployed   Type of Work:     Education:  9 to 11 years   Homebound arranged:    Museum/gallery curator Resources:  Medicaid   Other Resources:  Theatre stage manager Considerations Which May Impact Care:    Strengths:  Ability to meet basic needs ,Understanding of illness   Psychotropic Medications:         Pediatrician:       Pediatrician List:   [place]      Pediatrician Fax Number:    Risk Factors/Current Problems:  Mental Health Concerns    Cognitive State:  Alert ,Linear Thinking ,Goal Oriented    Mood/Affect:  Flat    CSW Assessment: CSW consulted for hx of severe depression/anxiety, hx of psychiatric hospitalizations, and no PNC. CSW met with MOB to offer support and complete  assessment. CSW introduced self and role. CSW observed MOB holding sleeping newborn. CSW informed MOB of the reason for consult. MOB expressed understanding. MOB was had a flat affect, but was open and forthcoming when speaking with CSW. CSW asked MOB how she is feeling. MOB expressed she is in shock. MOB shared the pregnancy was not planned and "I'm in shock looking at her, she's beautiful." CSW asked MOB what barriers led to lack of prenatal care. MOB stated she was going through a lot emotionally at the time and was unsure of what options were available. MOB stated " but the Lord walked me through." CSW informed MOB of the hospital drug screen policy. MOB was informed a CDS/UDS will be completed and if baby test positive for substances a CPS report will be made. MOB expressed understanding and reported she has never used substances. MOB stated she had CPS history years ago in 2014 due to an ex-boyfriend and the case was closed. MOB denies any additional CPS history. CSW asked MOB if she has everything needed for baby. MOB stated FOB Everett Graff, left to get baby some things. MOB reported she lives in the home with her three children. MOB is unemployed and receives food stamps. MOB expressed  interest in [facility] and allowed CSW to schedule a [facility] appointment. CSW discussed mental health history with MOB. MOB disclosed a diagnosis of anxiety and depression. MOB reported she was diagnosed with depression in 2006, following the passing of her first child. MOB shared she experiences depression on and off "but right now I'm okay." CSW asked MOB if she has ever tried therapy to treat symptoms. MOB reported she attended therapy for years with 'Reather Converse' and then went to a practice on [address] until 2014. CSW asked MOB if she would be interested in restarting therapy. MOB stated "I'm not ready to open up to someone yet. I guess I have trust issues." CSW asked MOB how she copes with depressive symptoms, MOB shared  she writes, listens to music and tries to be around people. CSW asked MOB if she has ever been prescribed medication, MOB reported she was prescribed Xanax in the past but did not like how it made her feel. MOB stated she was prescribed Xanax again last year, but did not take it out of fear of developing an addiction to it. CSW asked MOB about the behavioral health involvement and hospitalization during the pregnancy. MOB reported she is unsure what caused the events leading to the hospitalization. MOB stated "I'm usually strong, I guess things got overwhelming." CSW asked MOB if she has ever experienced an episode similar to that, she stated "no, that's the first time that has ever happened." MOB denies experiencing any symptoms since that episode. MOB referred to God often throughout the assessment and stated "God usually gets me through." CSW asked MOB about her support system. MOB reported she has a good support system consisting of FOB, FOB family, her mother, father and brother. MOB stated her family is currently watching her 3 children. MOB stated she feels like she has a good support system that will continue to be supportive once she returns home. CSW asked MOB if she has had any SI, MOB stated no, she wants to make it to heaven and suicide is not something that would not allow that. MOB also denies any HI or DV.   CSW provided MOB with education on baby blues period versus perinatal mood disorders and discussed treatment. CSW provided MOB with resources for mental health follow-up and encouraged MOB to reach out postpartum if mental health concerns arise. CSW provided MOB with the Postpartum Progress checklist and reiterated the importance of contacting a medical professional if symptoms are noted. MOB expressed understanding and shared she does not believe she has ever experienced postpartum symptoms.  CSW provided review of Sudden Infant Death Syndrome (SIDS) precautions. MOB reported she plans to buy  a bassinet for baby. CSW offered to provide MOB with a baby box, until a bassinet is purchased. MOB was in agreement, while insisting on the plan to buy a bassinet. MOB reported family is providing other items for baby and FOB is in the process of purchasing a car seat. CSW requested that MOB notify staff if she is unable to have a car seat purchased prior to discharging. CSW informed MOB a car seat can be purchased for $30. MOB stated she is still in the process of choosing a pediatrician and denies any transportation barriers to follow-up care. MOB provided permission to have a referral made for [facility], who can also assist with therapy services if interested.  MOB displayed no acute mental health symptoms during assessment. RN reported MOB has been appropriate with child. CSW confirmed MOB has  no current or recent CPS history related to the care of her children. MOB reports she has custody of all of her children.  CSW will continue to monitor CDS/UDS and make a CPS report if warranted. CSW identifies no further need for intervention and no barriers to discharge at this time.   CSW Plan/Description:  CSW Will Continue to Monitor Umbilical Cord Tissue Drug Screen Results and Make Report if Warranted,Child Protective Service Report ,Hospital Drug Screen Policy Information,Perinatal Mood and Anxiety Disorder (PMADs) Education,Sudden Infant Death Syndrome (SIDS) Education,Other Information/Referral to [employer] Resources,No Further Intervention Required/No Barriers to [employer] Education (Referral to [facility] [place])    Varney Baas 10/11/2020, 9:43 AM

## 2020-10-11 NOTE — MAU Note (Signed)
Patient transported in stable condition via stretcher to [facility].  Report given at bedside.

## 2020-10-12 ENCOUNTER — Encounter: Payer: Medicaid Other | Admitting: Obstetrics

## 2020-10-12 LAB — RPR: RPR Ser Ql: NONREACTIVE

## 2020-10-12 LAB — RUBELLA SCREEN: Rubella: 1.72 index (ref >0.99)

## 2020-10-12 MED ORDER — IBUPROFEN 800 MG PO TABS: 800.0000 mg | ORAL_TABLET | Freq: Four times a day (QID) | ORAL | 0 refills | Status: DC

## 2020-10-12 MED ORDER — OXYCODONE HCL 5 MG PO TABS: 5.0000 mg | ORAL_TABLET | ORAL | 0 refills | Status: DC | PRN

## 2020-10-12 MED ORDER — ACETAMINOPHEN 325 MG PO TABS: 650.0000 mg | ORAL_TABLET | ORAL | 0 refills | Status: DC | PRN

## 2020-10-12 MED ORDER — NORELGESTROMIN-ETH ESTRADIOL 150-35 MCG/24HR TD PTWK: 1.0000 | MEDICATED_PATCH | TRANSDERMAL | 12 refills | Status: DC

## 2020-10-12 MED ORDER — HYDROXYZINE HCL 25 MG PO TABS: 25.0000 mg | ORAL_TABLET | Freq: Three times a day (TID) | ORAL | Status: DC | PRN

## 2020-10-12 NOTE — Discharge Instructions (Signed)
DO NOT START PATCH BIRTH CONTROL FOR 4 WEEKS. Start on November 09, 2020**  Postpartum Care After Cesarean Delivery This sheet gives you information about how to care for yourself from the time you deliver your baby to up to 6-12 weeks after delivery (postpartum period). Your health care provider may also give you more specific instructions. If you have problems or questions, contact your health care provider. Follow these instructions at home: Medicines  Take over-the-counter and prescription medicines only as told by your health care provider.  If you were prescribed an antibiotic medicine, take it as told by your health care provider. Do not stop taking the antibiotic even if you start to feel better.  Ask your health care provider if the medicine prescribed to you: ? Requires you to avoid driving or using heavy machinery. ? Can cause constipation. You may need to take actions to prevent or treat constipation, such as:  Drink enough fluid to keep your urine pale yellow.  Take over-the-counter or prescription medicines.  Eat foods that are high in fiber, such as beans, whole grains, and fresh fruits and vegetables.  Limit foods that are high in fat and processed sugars, such as fried or sweet foods. Activity  Gradually return to your normal activities as told by your health care provider.  Avoid activities that take a lot of effort and energy (are strenuous) until approved by your health care provider. Walking at a slow to moderate pace is usually safe. Ask your health care provider what activities are safe for you. ? Do not lift anything that is heavier than your baby or 10 lb (4.5 kg) as told by your health care provider. ? Do not vacuum, climb stairs, or drive a car for as long as told by your health care provider.  If possible, have someone help you at home until you are able to do your usual activities yourself.  Rest as much as possible. Try to rest or take naps while your baby  is sleeping. Vaginal bleeding  It is normal to have vaginal bleeding (lochia) after delivery. Wear a sanitary pad to absorb vaginal bleeding and discharge. ? During the first week after delivery, the amount and appearance of lochia is often similar to a menstrual period. ? Over the next few weeks, it will gradually decrease to a dry, yellow-brown discharge. ? For most women, lochia stops completely by 4-6 weeks after delivery. Vaginal bleeding can vary from woman to woman.  Change your sanitary pads frequently. Watch for any changes in your flow, such as: ? A sudden increase in volume. ? A change in color. ? Large blood clots.  If you pass a blood clot, save it and call your health care provider to discuss. Do not flush blood clots down the toilet before you get instructions from your health care provider.  Do not use tampons or douches until your health care provider says this is safe.  If you are not breastfeeding, your period should return 6-8 weeks after delivery. If you are breastfeeding, your period may return anytime between 8 weeks after delivery and the time that you stop breastfeeding. Perineal care  If your C-section (Cesarean section) was unplanned, and you were allowed to labor and push before delivery, you may have pain, swelling, and discomfort of the tissue between your vaginal opening and your anus (perineum). You may also have an incision in the tissue (episiotomy) or the tissue may have torn during delivery. Follow these instructions as told by  your health care provider: ? Keep your perineum clean and dry as told by your health care provider. Use medicated pads and pain-relieving sprays and creams as directed. ? If you have an episiotomy or vaginal tear, check the area every day for signs of infection. Check for:  Redness, swelling, or pain.  Fluid or blood.  Warmth.  Pus or a bad smell. ? You may be given a squirt bottle to use instead of wiping to clean the  perineum area after you go to the bathroom. As you start healing, you may use the squirt bottle before wiping yourself. Make sure to wipe gently. ? To relieve pain caused by an episiotomy, vaginal tear, or hemorrhoids, try taking a warm sitz bath 2-3 times a day. A sitz bath is a warm water bath that is taken while you are sitting down. The water should only come up to your hips and should cover your buttocks.   Breast care  Within the first few days after delivery, your breasts may feel heavy, full, and uncomfortable (breast engorgement). You may also have milk leaking from your breasts. Your health care provider can suggest ways to help relieve breast discomfort. Breast engorgement should go away within a few days.  If you are breastfeeding: ? Wear a bra that supports your breasts and fits you well. ? Keep your nipples clean and dry. Apply creams and ointments as told by your health care provider. ? You may need to use breast pads to absorb milk leakage. ? You may have uterine contractions every time you breastfeed for several weeks after delivery. Uterine contractions help your uterus return to its normal size. ? If you have any problems with breastfeeding, work with your health care provider or a Advertising copywriter.  If you are not breastfeeding: ? Avoid touching your breasts as this can make your breasts produce more milk. ? Wear a well-fitting bra and use cold packs to help with swelling. ? Do not squeeze out (express) milk. This causes you to make more milk. Intimacy and sexuality  Ask your health care provider when you can engage in sexual activity. This may depend on your: ? Risk of infection. ? Healing rate. ? Comfort and desire to engage in sexual activity.  You are able to get pregnant after delivery, even if you have not had your period. If desired, talk with your health care provider about methods of family planning or birth control (contraception). Lifestyle  Do not use  any products that contain nicotine or tobacco, such as cigarettes, e-cigarettes, and chewing tobacco. If you need help quitting, ask your health care provider.  Do not drink alcohol, especially if you are breastfeeding. Eating and drinking  Drink enough fluid to keep your urine pale yellow.  Eat high-fiber foods every day. These may help prevent or relieve constipation. High-fiber foods include: ? Whole grain cereals and breads. ? Brown rice. ? Beans. ? Fresh fruits and vegetables.  Take your prenatal vitamins until your postpartum checkup or until your health care provider tells you it is okay to stop.   General instructions  Keep all follow-up visits for you and your baby as told by your health care provider. Most women visit their health care provider for a postpartum checkup within the first 3-6 weeks after delivery. Contact a health care provider if you:  Feel unable to cope with the changes that a new baby brings to your life, and these feelings do not go away.  Feel unusually sad  or worried.  Have breasts that are painful, hard, or turn red.  Have a fever.  Have trouble holding urine or keeping urine from leaking.  Have little or no interest in activities you used to enjoy.  Have not breastfed at all and you have not had a menstrual period for 12 weeks after delivery.  Have stopped breastfeeding and you have not had a menstrual period for 12 weeks after you stopped breastfeeding.  Have questions about caring for yourself or your baby.  Pass a blood clot from your vagina. Get help right away if you:  Have chest pain.  Have difficulty breathing.  Have sudden, severe leg pain.  Have severe pain or cramping in your abdomen.  Bleed from your vagina so much that you fill more than one sanitary pad in one hour. Bleeding should not be heavier than your heaviest period.  Develop a severe headache.  Faint.  Have blurred vision or spots in your vision.  Have a  bad-smelling vaginal discharge.  Have thoughts about hurting yourself or your baby. If you ever feel like you may hurt yourself or others, or have thoughts about taking your own life, get help right away. You can go to your nearest emergency department or call:  Your local emergency services (911 in the [place]  A suicide crisis helpline, such as the National Suicide Prevention Lifeline at 347-542-2092. This is open 24 hours a day. Summary  The period of time from when you deliver your baby to up to 6-12 weeks after delivery is called the postpartum period.  Gradually return to your normal activities as told by your health care provider.  Keep all follow-up visits for you and your baby as told by your health care provider. This information is not intended to replace advice given to you by your health care provider. Make sure you discuss any questions you have with your health care provider. Document Revised: 05/07/2018 Document Reviewed: 05/07/2018 [employer] Patient Education  2021 [employer].

## 2020-10-12 NOTE — Consult Note (Signed)
[facility] Consult   Reason for Consult:  Patient is presenting with psychomotor agitation, not making eye contact, won't answer medical team questions. 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 Chauncey Reading Patient Identification: Melinda Jimenez MRN:  [id number] Principal Diagnosis: Cesarean delivery delivered Diagnosis:  Principal Problem:   Cesarean delivery delivered Active Problems:   Premature rupture of membranes (PROM), onset of labor after 24 hours, antepartum, preterm, third trimester   Anxiety  Total Time spent with patient: 45 minutes  Subjective:   Melinda Jimenez is a 35 y.o. female with [redacted] week gestation who presented with PROM. Psych consult placed for history of psychosis, and patient displaying increased anxiety and psychomotor agitation. On evaluation she was alert and oriented x 3, calm and cooperative and showed much willingness to participate in the evaluation. Her significant other Vonna Kotyk was at the bedside, and offered some assistance throughout the evaluation. Patient denies any anxiety, depressive symptoms, paranoia, mania, psychosis at this time. SHe answers all questions appropriately and does not appear to be responding to internal stimuli. She does appear to have some judgement and insight as she makes an all out effort to understand the psychotic episode she had in JUly 2021. She denies some any current suicidal ideations, homicidal ideations and or hallucinations at this time. She appears to have knowledge and understanding to make informed and medical decisions. Her significant other is present and adds collaborative information. Both decline any previous psychiatric history. SHe declines any previous psychiatric diagnosis. She denies any substance use at this time.   On evaluation she is attentive and pleasant with me. She is able to show me some interest in the topic of Post partum psychosis. She denies any symptoms  of this at this time. She does not appear to be exhibiting any psychosis, or displaying in psychomotor agitation. She is sitting upright on the bed, and shows appropriate support and level of affection with significant other. She expresses much interest and concern regarding her safety and safety of her children. She denies any history of suicide attempt. She denies any previous history of post partum psychosis or post partum depression.   HPI:   Melinda Jimenez is a 35 y.o. female [id number] with IUP at 13w0dby 6 week u/s  presenting for premature rupture of membranes  and history of two previous c sections.  The patient has received no prenatal care other than sporadic MAU visits.  PROM verified by fern and amnisure,  She reports +FMs, some LOF, no VB, no blurry vision, headaches or peripheral edema, and RUQ pain.  She received no prenatal care.  The patient has a history of gestational diabetes and HSV infection.  There has been no recent outbreaks per history. Status post Cesarean section. Postoperative course complicated by psychiatric complications: patient is tearful, does not engage with providers, very flat affect.  Patient wants to be discharged home, which can be done, however patient had no prenatal care and does not have an established medical home. 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 Now post partum with disorganized behavior and psychomotor agitation, unable to assess for SI/HI/VAH, recommend patient have psych consult for safety planning.  Past Psychiatric History: Unable to obtain psychiatric history from patient given her current psychotic state. No documented history of past psychiatric history in the EMR.   Risk to Self:  yes Risk to Others:  none Prior Inpatient Therapy:  none Prior Outpatient  Therapy:  none  Past Medical History:  Past Medical History:  Diagnosis Date  . Anemia   . Anxiety    off meds with preg  . Carpal tunnel syndrome   .  Chlamydia   . Genital HSV    Last OB: > 70yrago; Valtrex 500 mg po prn outbreaks  . GERD (gastroesophageal reflux disease)   . Gestational diabetes    diet controlled  . Headache(784.0)   . IBS (irritable bowel syndrome)   . Infection    UTI  . Mood swings   . Panic attack   . Thyroid disease   . Trichomonas     Past Surgical History:  Procedure Laterality Date  . CESAREAN SECTION N/A 05/20/2013   Procedure: CESAREAN SECTION;  Surgeon: CShelly Bombard MD;  [facility];  Service: Obstetrics;  Laterality: N/A;  . CESAREAN SECTION N/A 04/06/2017   Procedure: CESAREAN SECTION;  Surgeon: EChancy Milroy MD;  [place] [facility]  Service: Obstetrics;  Laterality: N/A;  . CESAREAN SECTION  10/10/2020   Procedure: CESAREAN SECTION;  Surgeon: BGriffin Basil MD;  [facility];  Service: Obstetrics;;  . INDUCED ABORTION  2009  . VAGINAL DELIVERY  2007   Family History:  Family History  Problem Relation Age of Onset  . Depression Mother   . Anxiety disorder Mother   . Breast cancer Other   . Bipolar disorder Brother   . Diabetes Maternal Grandmother   . Cancer Maternal Grandmother   . Hypertension Maternal Grandmother   . Hypertension Paternal Grandmother   . Autism Other   . Schizophrenia Other    Family Psychiatric  History: Chart review shows a family history of depression, anxiety, autism and schizophrenia.  Social History:  Social History   Substance and Sexual Activity  Alcohol Use No   Comment: Twice a month     Social History   Substance and Sexual Activity  Drug Use No    Social History   Socioeconomic History  . Marital status: Single    Spouse name: Not on file  . Number of children: 1  . Years of education: Not on file  . Highest education level: Not on file  Occupational History  . Occupation: Unemployed  Tobacco Use  . Smoking status: Passive Smoke Exposure - Never Smoker  . Smokeless tobacco: Never Used  . Tobacco comment:  was not a daily smoker, tried it  Vaping Use  . Vaping Use: Never used  Substance and Sexual Activity  . Alcohol use: No    Comment: Twice a month  . Drug use: No  . Sexual activity: Yes    Birth control/protection: None    Comment: Patient is interested in implant  Other Topics Concern  . Not on file  Social History Narrative  . Not on file   Social Determinants of Health   Financial Resource Strain: Not on file  Food Insecurity: Not on file  Transportation Needs: Not on file  Physical Activity: Not on file  Stress: Not on file  Social Connections: Not on file   Additional Social History:    Allergies:   Allergies  Allergen Reactions  . Latex Itching    Labs:  Results for orders placed or performed during the hospital encounter of 10/10/20 (from the past 48 hour(s))  Urinalysis, Routine w reflex microscopic Urine, Clean Catch     Status: Abnormal   Collection Time: 10/10/20  4:40 PM  Result Value Ref  Range   Color, Urine YELLOW YELLOW   APPearance CLEAR CLEAR   Specific Gravity, Urine 1.027 1.005 - 1.030   pH 6.0 5.0 - 8.0   Glucose, UA NEGATIVE NEGATIVE mg/dL   Hgb urine dipstick NEGATIVE NEGATIVE   Bilirubin Urine NEGATIVE NEGATIVE   Ketones, ur NEGATIVE NEGATIVE mg/dL   Protein, ur 100 (A) NEGATIVE mg/dL   Nitrite NEGATIVE NEGATIVE   Leukocytes,Ua NEGATIVE NEGATIVE   RBC / HPF 0-5 0 - 5 RBC/hpf   WBC, UA 0-5 0 - 5 WBC/hpf   Bacteria, UA RARE (A) NONE SEEN   Squamous Epithelial / LPF 0-5 0 - 5   Mucus PRESENT     Comment: Performed at [facility], [place] [address] [place], [place] [address]  Amnisure rupture of membrane (rom)not at [facility]     Status: None   Collection Time: 10/10/20  4:40 PM  Result Value Ref Range   Amnisure ROM POSITIVE     Comment: Performed at [facility], [address] [place], [place] [address]  Maryann Alar Test     Status: Abnormal   Collection Time: 10/10/20  5:00 PM  Result Value Ref Range   POCT Fern Test Positive =  ruptured amniotic membanes   Type and screen [place]     Status: None   Collection Time: 10/10/20  7:36 PM  Result Value Ref Range   ABO/RH(D) A POS    Antibody Screen NEG    Sample Expiration      10/13/2020,2359 Performed at [facility], [place] [address] [place], [place] [address]   CBC     Status: Abnormal   Collection Time: 10/10/20  7:36 PM  Result Value Ref Range   WBC 8.5 4.0 - 10.5 K/uL   RBC 4.61 3.87 - 5.11 MIL/uL   Hemoglobin 10.9 (L) 12.0 - 15.0 g/dL   HCT 34.1 (L) 36.0 - 46.0 %   MCV 74.0 (L) 80.0 - 100.0 fL   MCH 23.6 (L) 26.0 - 34.0 pg   MCHC 32.0 30.0 - 36.0 g/dL   RDW 17.4 (H) 11.5 - 15.5 %   Platelets 319 150 - 400 K/uL   nRBC 0.0 0.0 - 0.2 %    Comment: Performed at [facility] [address] [place], [place] [address]  Hemoglobin A1c     Status: None   Collection Time: 10/10/20  7:36 PM  Result Value Ref Range   Hgb A1c MFr Bld 5.4 4.8 - 5.6 %    Comment: (NOTE) Pre diabetes:          5.7%-6.4%  Diabetes:              >6.4%  Glycemic control for   <7.0% adults with diabetes    Mean Plasma Glucose 108.28 mg/dL    Comment: Performed at [facility] [address], [place], [place] [address]  Hepatitis B surface antigen     Status: None   Collection Time: 10/10/20  7:36 PM  Result Value Ref Range   Hepatitis B Surface Ag NON REACTIVE NON REACTIVE    Comment: Performed at [facility] [address], [place], [place] [address]  Rubella screen     Status: None   Collection Time: 10/10/20  7:36 PM  Result Value Ref Range   Rubella 1.72 Immune >0.99 index    Comment: (NOTE)  Non-immune       <0.90                                Equivocal  0.90 - 0.99                                Immune           >0.99 Performed At: [facility] [place], [place] [id number] Rush Farmer MD [id number]   Rapid HIV screen (HIV 1/2 Ab+Ag)     Status: None    Collection Time: 10/10/20  7:36 PM  Result Value Ref Range   HIV-1 P24 Antigen - HIV24 NON REACTIVE NON REACTIVE    Comment: (NOTE) Detection of p24 may be inhibited by biotin in the sample, causing false negative results in acute infection.    HIV 1/2 Antibodies NON REACTIVE NON REACTIVE   Interpretation (HIV Ag Ab)      A non reactive test result means that HIV 1 or HIV 2 antibodies and HIV 1 p24 antigen were not detected in the specimen.    Comment: Performed at [facility], [place] [address], [place], [place] [address]  RPR     Status: None   Collection Time: 10/10/20  7:36 PM  Result Value Ref Range   RPR Ser Ql NON REACTIVE NON REACTIVE    Comment: Performed at [facility], [place] [address], [place], [place] [address]  CBC     Status: Abnormal   Collection Time: 10/11/20  5:25 AM  Result Value Ref Range   WBC 11.2 (H) 4.0 - 10.5 K/uL   RBC 4.18 3.87 - 5.11 MIL/uL   Hemoglobin 9.7 (L) 12.0 - 15.0 g/dL   HCT 31.4 (L) 36.0 - 46.0 %   MCV 75.1 (L) 80.0 - 100.0 fL   MCH 23.2 (L) 26.0 - 34.0 pg   MCHC 30.9 30.0 - 36.0 g/dL   RDW 17.2 (H) 11.5 - 15.5 %   Platelets 254 150 - 400 K/uL   nRBC 0.0 0.0 - 0.2 %    Comment: Performed at [facility] [address] [place], [place] [address]  Creatinine, serum     Status: None   Collection Time: 10/11/20  5:25 AM  Result Value Ref Range   Creatinine, Ser 0.56 0.44 - 1.00 mg/dL   GFR, Estimated >60 >60 mL/min    Comment: (NOTE) Calculated using the CKD-EPI Creatinine Equation (2021) Performed at [facility] [address] [place], [place] [address]   Glucose, capillary     Status: Abnormal   Collection Time: 10/11/20  6:01 AM  Result Value Ref Range   Glucose-Capillary 158 (H) 70 - 99 mg/dL    Comment: Glucose reference range applies only to samples taken after fasting for at least 8 hours.    Current Facility-Administered Medications  Medication Dose Route Frequency Provider Last Rate Last Admin  .  acetaminophen (TYLENOL) tablet 1,000 mg  1,000 mg Oral Q8H Janet Berlin, MD   1,000 mg at 10/11/20 1010  . coconut oil  1 application Topical PRN Janet Berlin, MD      . witch hazel-glycerin (TUCKS) pad 1 application  1 application Topical PRN Marsala, Placido Sou, MD       And  . dibucaine (NUPERCAINAL) 1 % rectal ointment  1 application  1 application Rectal PRN Janet Berlin, MD      . diphenhydrAMINE (BENADRYL) injection 12.5 mg  12.5 mg Intravenous Q4H PRN Nunzio Cobbs M, DO       Or  . diphenhydrAMINE (BENADRYL) capsule 25 mg  25 mg Oral Q4H PRN Nunzio Cobbs M, DO      . diphenhydrAMINE (BENADRYL) capsule 25 mg  25 mg Oral Q6H PRN Janet Berlin, MD      . enoxaparin (LOVENOX) injection 45 mg  0.5 mg/kg Subcutaneous Q24H Janet Berlin, MD      . ibuprofen (ADVIL) tablet 800 mg  800 mg Oral Q6H Janet Berlin, MD   800 mg at 10/12/20 0603  . lactated ringers infusion   Intravenous Continuous Janet Berlin, MD   Stopped at 10/11/20 1215  . measles, mumps & rubella vaccine (MMR) injection 0.5 mL  0.5 mL Subcutaneous Once Janet Berlin, MD      . menthol-cetylpyridinium (CEPACOL) lozenge 3 mg  1 lozenge Oral Q2H PRN Janet Berlin, MD      . nalbuphine (NUBAIN) injection 5 mg  5 mg Intravenous Q4H PRN Pervis Hocking, DO       Or  . nalbuphine (NUBAIN) injection 5 mg  5 mg Subcutaneous Q4H PRN Nunzio Cobbs M, DO      . nalbuphine (NUBAIN) injection 5 mg  5 mg Intravenous Once PRN Nunzio Cobbs M, DO       Or  . nalbuphine (NUBAIN) injection 5 mg  5 mg Subcutaneous Once PRN Nunzio Cobbs M, DO      . naloxone [facility]) injection 0.4 mg  0.4 mg Intravenous PRN Nunzio Cobbs M, DO       And  . sodium chloride flush (NS) 0.9 % injection 3 mL  3 mL Intravenous PRN Nunzio Cobbs M, DO      . naloxone HCl (NARCAN) 2 mg in dextrose 5 % 250 mL infusion  1-4 mcg/kg/hr Intravenous Continuous PRN Pervis Hocking, DO      .  oxyCODONE (Oxy IR/ROXICODONE) immediate release tablet 5-10 mg  5-10 mg Oral Q4H PRN Janet Berlin, MD   10 mg at 10/12/20 0809  . prenatal multivitamin tablet 1 tablet  1 tablet Oral Q1200 Janet Berlin, MD      . scopolamine (TRANSDERM-SCOP) 1 MG/3DAYS 1.5 mg  1 patch Transdermal Once Pervis Hocking, DO   1.5 mg at 10/11/20 0005  . senna-docusate (Senokot-S) tablet 2 tablet  2 tablet Oral Q24H Janet Berlin, MD   2 tablet at 10/11/20 0240  . simethicone (MYLICON) chewable tablet 80 mg  80 mg Oral TID PC Janet Berlin, MD   80 mg at 10/11/20 1011  . simethicone (MYLICON) chewable tablet 80 mg  80 mg Oral PRN Janet Berlin, MD   80 mg at 10/11/20 2310  . Tdap (BOOSTRIX) injection 0.5 mL  0.5 mL Intramuscular Once Janet Berlin, MD      . zolpidem [facility]) tablet 5 mg  5 mg Oral QHS PRN Berniece Andreas Placido Sou, MD        Musculoskeletal: Strength & Muscle Tone: within normal limits Gait & Station: normal Patient leans: N/A  Psychiatric Specialty Exam: Physical Exam Vitals and nursing note reviewed.  Constitutional:      Appearance: She is well-developed.  HENT:     Head: Normocephalic.  Pulmonary:     Effort: Pulmonary effort is normal.  Musculoskeletal:  General: Normal range of motion.     Cervical back: Normal range of motion.  Neurological:     General: No focal deficit present.     Mental Status: She is alert.  Psychiatric:        Attention and Perception: She is inattentive. She perceives visual hallucinations.        Mood and Affect: Mood is anxious.        Speech: Speech normal.        Behavior: Behavior is agitated.        Thought Content: Thought content is delusional.        Cognition and Memory: Cognition is impaired.        Judgment: Judgment is impulsive and inappropriate.     Review of Systems  Respiratory: Positive for shortness of breath.   Psychiatric/Behavioral: Positive for agitation, decreased concentration and hallucinations. The  patient is nervous/anxious.   All other systems reviewed and are negative.   Blood pressure 114/79, pulse 80, temperature 98.6 F (37 C), temperature source Oral, resp. rate 18, weight 92.2 kg, SpO2 99 %, unknown if currently breastfeeding.Body mass index is 31.83 kg/m.  General Appearance: Casual  Eye Contact:  Fair  Speech:  Clear and Coherent and Normal Rate  Volume:  Normal  Mood:  Euthymic  Affect:  Constricted and Flat  Thought Process:  Coherent, Linear and Descriptions of Associations: Intact  Orientation:  Full (Time, Place, and Person)  Thought Content:  Logical  Suicidal Thoughts:  Denies  Homicidal Thoughts:  dENIES  Memory:  Immediate;   Fair Recent;   Fair Remote;   Poor  Judgement:  Intact  Insight:  Present  Psychomotor Activity:  Normal  Concentration:  Concentration: Fair and Attention Span: Fair  Recall:  [employer] of Knowledge:  Fair  Language:  Fair  Akathisia:  No  Handed:  Right  AIMS (if indicated):     Assets:  Housing Social Support  ADL's:  Intact  Cognition:  WNL  Sleep:     35 year old female who presents 35 week gestsation, no previous prenatl care this pregnancy, and history of psychosis during this pregnancy. Patient originally presented with psychomotor agitation, gaurded and inappropriate behaviors with her medical team. During my evaluation she was alert and oriented, did exhibit some delay and limitations in thinking, however did not pose a threat to herself or children. She was answered all questions appropriately, and does appear interested in outpatient behavioral health services. Her current presentation does not warrant inpatient admission or iVC criteria (force meds) for stabilization at this time. Reviewed the presenting symptoms with significant other (joshua) and the patient, who both verbalized understanding. Reviewed her prior pscyhotic event with both, and explained that this places her at high risk.   While future psychiatric  events cannot be accurately predicted, the patient does not necessitate acute inpatient psychiatric care at this time. There is a possibility that she would benefit from an outpatient psychological evaluation, for further evaluation of psychiatric conditions to include learning additional coping skills and provide structure and safety outside of the context of her psychosocial stressors.   Treatment Plan Summary: Brief psychotic episode: Plan Referral to [facility], schedule an appointment within 1 week. Will prescribe hydroxyzine 9m po TID prn for anxiety. At this time patient denies suicidal ideation, homicidal ideation, and or hallucintaions. She does not appear to be at imminent risk to self or others, and does not currently meet IVC criteria.    -  Work with SW to develop a Chief Strategy Officer with patient and significant other. Baby will liekly remain in the NICU for sometime which will help initially. WIll recommend close observation for the first four weeks to six weeks.  -Encourage sleep and rest.  -Keep a mood diary and close eye on symptoms as well as significant other monitoring close symptoms.  -Schedule 1 week appointment at [facility]   Disposition: No evidence of imminent risk to self or others at present.   Patient does not meet criteria for psychiatric inpatient admission. Supportive therapy provided about ongoing stressors. Refer to IOP. Discussed crisis plan, support from social network, calling 911, coming to the Emergency Department, and calling Suicide Hotline.  Suella Broad, FNP 10/12/2020 12:16 PM

## 2020-10-12 NOTE — Progress Notes (Signed)
Subjective: Postpartum Day 2: Cesarean Delivery Patient reports incisional pain.  Patient did not engage with provider, but crying in pain. Patient is rocking back and forth, grabbing her breast, facetiming FOB. She declined all pain medication earlier, but agreed to it now. Patient reports that she's missing her other children, does not have custody of some of her children. Infant in NICU.  Objective: Vital signs in last 24 hours: Temp:  [97.5 F (36.4 C)-98.6 F (37 C)] 98.6 F (37 C) (01/12 0517) Pulse Rate:  [77-85] 80 (01/12 0517) Resp:  [16-18] 18 (01/12 0517) BP: (114-127)/(76-85) 114/79 (01/12 0517) SpO2:  [99 %-100 %] 99 % (01/12 0517)  Physical Exam:  General: alert, appears stated age, distracted and moderate distress Lochia: appropriate Uterine Fundus: firm Incision: healing well DVT Evaluation: No evidence of DVT seen on physical exam.  Recent Labs    10/10/20 1936 10/11/20 0525  HGB 10.9* 9.7*  HCT 34.1* 31.4*    Assessment/Plan: Status post Cesarean section. Postoperative course complicated by psychiatric complications: patient is tearful, does not engage with providers, very flat affect.  Patient wants to be discharged home, which can be done, however patient had no prenatal care and does not have an established medical home. 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 Now post partum with disorganized behavior and psychomotor agitation, unable to assess for SI/HI/VAH, recommend patient have psych consult for safety planning.  Shirlean Mylar 10/12/2020, 8:30 AM

## 2020-10-14 ENCOUNTER — Ambulatory Visit: Payer: Medicaid Other | Admitting: Cardiology

## 2020-10-14 LAB — SURGICAL PATHOLOGY

## 2020-10-18 ENCOUNTER — Inpatient Hospital Stay (HOSPITAL_COMMUNITY)
Admission: AD | Admit: 2020-10-18 | Discharge: 2020-10-18 | Disposition: A | Discharge status: Home / Self Care | Payer: Medicaid Other | Attending: Obstetrics and Gynecology | Admitting: Obstetrics and Gynecology

## 2020-10-18 ENCOUNTER — Inpatient Hospital Stay (HOSPITAL_COMMUNITY): Payer: Medicaid Other

## 2020-10-18 ENCOUNTER — Other Ambulatory Visit: Payer: Self-pay

## 2020-10-18 ENCOUNTER — Encounter (HOSPITAL_COMMUNITY): Payer: Self-pay | Admitting: Obstetrics and Gynecology

## 2020-10-18 ENCOUNTER — Encounter: Payer: Medicaid Other | Admitting: Licensed Clinical Social Worker

## 2020-10-18 ENCOUNTER — Other Ambulatory Visit (HOSPITAL_COMMUNITY): Payer: Self-pay | Admitting: Family Medicine

## 2020-10-18 ENCOUNTER — Inpatient Hospital Stay (HOSPITAL_BASED_OUTPATIENT_CLINIC_OR_DEPARTMENT_OTHER): Payer: Medicaid Other

## 2020-10-18 DIAGNOSIS — O165 Unspecified maternal hypertension, complicating the puerperium: Secondary | ICD-10-CM | POA: Diagnosis not present

## 2020-10-18 DIAGNOSIS — Z7722 Contact with and (suspected) exposure to environmental tobacco smoke (acute) (chronic): Secondary | ICD-10-CM | POA: Diagnosis not present

## 2020-10-18 DIAGNOSIS — I2699 Other pulmonary embolism without acute cor pulmonale: Secondary | ICD-10-CM

## 2020-10-18 DIAGNOSIS — O8883 Other embolism in the puerperium: Secondary | ICD-10-CM | POA: Diagnosis not present

## 2020-10-18 DIAGNOSIS — R0602 Shortness of breath: Secondary | ICD-10-CM | POA: Diagnosis not present

## 2020-10-18 DIAGNOSIS — O8823 Thromboembolism in the puerperium: Secondary | ICD-10-CM | POA: Diagnosis not present

## 2020-10-18 DIAGNOSIS — R079 Chest pain, unspecified: Secondary | ICD-10-CM

## 2020-10-18 DIAGNOSIS — Z8616 Personal history of COVID-19: Secondary | ICD-10-CM | POA: Diagnosis not present

## 2020-10-18 DIAGNOSIS — I2602 Saddle embolus of pulmonary artery with acute cor pulmonale: Secondary | ICD-10-CM

## 2020-10-18 DIAGNOSIS — O99893 Other specified diseases and conditions complicating puerperium: Secondary | ICD-10-CM | POA: Diagnosis not present

## 2020-10-18 HISTORY — DX: Essential (primary) hypertension: I10

## 2020-10-18 HISTORY — DX: Other pulmonary embolism without acute cor pulmonale: I26.99

## 2020-10-18 LAB — COMPREHENSIVE METABOLIC PANEL
ALT: 14 U/L (ref 0–44)
AST: 11 U/L — ABNORMAL LOW (ref 15–41)
Albumin: 3.1 g/dL — ABNORMAL LOW (ref 3.5–5.0)
Alkaline Phosphatase: 63 U/L (ref 38–126)
Anion gap: 11 (ref 5–15)
BUN: 7 mg/dL (ref 6–20)
CO2: 24 mmol/L (ref 22–32)
Calcium: 9.3 mg/dL (ref 8.9–10.3)
Chloride: 102 mmol/L (ref 98–111)
Creatinine, Ser: 0.58 mg/dL (ref 0.44–1.00)
GFR, Estimated: >60 mL/min (ref >60)
Glucose, Bld: 113 mg/dL — ABNORMAL HIGH (ref 70–99)
Potassium: 3.5 mmol/L (ref 3.5–5.1)
Sodium: 137 mmol/L (ref 135–145)
Total Bilirubin: 1.2 mg/dL (ref 0.3–1.2)
Total Protein: 7.7 g/dL (ref 6.5–8.1)

## 2020-10-18 LAB — CBC
HCT: 33.1 % — ABNORMAL LOW (ref 36.0–46.0)
Hemoglobin: 10.2 g/dL — ABNORMAL LOW (ref 12.0–15.0)
MCH: 22.8 pg — ABNORMAL LOW (ref 26.0–34.0)
MCHC: 30.8 g/dL (ref 30.0–36.0)
MCV: 73.9 fL — ABNORMAL LOW (ref 80.0–100.0)
Platelets: 362 10*3/uL (ref 150–400)
RBC: 4.48 MIL/uL (ref 3.87–5.11)
RDW: 16.9 % — ABNORMAL HIGH (ref 11.5–15.5)
WBC: 12.4 10*3/uL — ABNORMAL HIGH (ref 4.0–10.5)
nRBC: 0 % (ref 0.0–0.2)

## 2020-10-18 LAB — TROPONIN I (HIGH SENSITIVITY): Troponin I (High Sensitivity): 13 ng/L (ref <18)

## 2020-10-18 LAB — BRAIN NATRIURETIC PEPTIDE: B Natriuretic Peptide: 15.7 pg/mL (ref 0.0–100.0)

## 2020-10-18 LAB — APTT: aPTT: 33 seconds (ref 24–36)

## 2020-10-18 LAB — PROTIME-INR
INR: 1 (ref 0.8–1.2)
Prothrombin Time: 12.8 seconds (ref 11.4–15.2)

## 2020-10-18 LAB — ECHOCARDIOGRAM COMPLETE: S' Lateral: 3.1 cm

## 2020-10-18 MED ORDER — IBUPROFEN 800 MG PO TABS
800.0000 mg | ORAL_TABLET | Freq: Once | ORAL | Status: DC
  Filled 2020-10-18: qty 1

## 2020-10-18 MED ORDER — IOHEXOL 350 MG/ML SOLN
80.0000 mL | Freq: Once | INTRAVENOUS | Status: AC | PRN
  Administered 2020-10-18: 80 mL via INTRAVENOUS

## 2020-10-18 MED ORDER — APIXABAN 5 MG PO TABS: 5.0000 mg | ORAL_TABLET | Freq: Two times a day (BID) | ORAL | Status: DC

## 2020-10-18 MED ORDER — APIXABAN 5 MG PO TABS: 5.0000 mg | ORAL_TABLET | Freq: Two times a day (BID) | ORAL | 1 refills | Status: DC

## 2020-10-18 MED ORDER — APIXABAN (ELIQUIS) VTE STARTER PACK (10MG AND 5MG): ORAL_TABLET | ORAL | 0 refills | Status: DC

## 2020-10-18 MED ORDER — CYCLOBENZAPRINE HCL 5 MG PO TABS
10.0000 mg | ORAL_TABLET | Freq: Once | ORAL | Status: AC
  Administered 2020-10-18: 10 mg via ORAL
  Filled 2020-10-18: qty 2

## 2020-10-18 MED ORDER — APIXABAN 5 MG PO TABS
10.0000 mg | ORAL_TABLET | Freq: Two times a day (BID) | ORAL | Status: DC
  Administered 2020-10-18: 10 mg via ORAL
  Filled 2020-10-18 (×2): qty 2

## 2020-10-18 MED FILL — ELIQUIS STARTER PACK 5 MG T: 5 | 30 days supply | Qty: 74 | Fill #0

## 2020-10-18 MED FILL — ELIQUIS 5 MG TABLET: 5 | 60 days supply | Qty: 120 | Fill #0

## 2020-10-18 NOTE — Consult Note (Signed)
NAME:  Melinda Jimenez, MRN:  [id number], DOB:  [birth year], LOS: 0 ADMISSION DATE:  10/18/2020, CONSULTATION DATE:  10/18/20 REFERRING MD:  Tyler Deis  CHIEF COMPLAINT:  SOB, Chest pain  Brief History:  35yo female who presented with complaints of chest pain, work up in MAU revealed bilateral PE's with RV to LV ratio of 0.85. Given some concern for questionable hemorrhage with concomitant PE on CT Pulmonary critical care was consulted  History of Present Illness:  Melinda Jimenez is 35yo female with PMH significant for but not limited to HTN, GERD, gestitional diabetes, recent COVID infection, anxiety, and depression who presented to the MAU with complaints of chest pain. Patient is 8 days post caesarian section who returned today due to complaints of continued chest pain and worse SOB. Denies any other acute complaints   On arrival see was seen hemodynamically stable with only mild tachycardia of HR 104.   Found to have PE. CTA chest also with concern for possible pulmonary hemorrhage. In this setting PCCM is consulted for recommendations  Past Medical History:  Anemia Anxiety Psychosis, post-partum COVID-19 PNA HSV GERD Gestational DM HA IBS  UTI Chlamydia Trichomonas   Significant Hospital Events:  1/18 Presents to MAU with SOB + chest pain. Found to have PE  + GGOs concerning for possible pulm hemorrhage. No R heart strain on ECHO. Pulm consult for recs   Consults:  PCCM  Procedures:    Significant Diagnostic Tests:  CTA chest 1/18 >> Positive examination for pulmonary embolism. Central filling defects in the bilateral lower lobar pulmonary arteries with extension into the segmental and subsegmental branches.The RV to LV ratio is 0.85 and the MPA to Ao ratio is 0.9. Findings which are concerning for early right heart strain. Wedge-shaped and nodular areas of consolidation and ground-glass in the right lower lobe, which may represent pulmonary hemorrhage  ECHO 1/18: LVEF 55-60%  no regional wall motion abnormalities. LV grade I diastolic dysfunction. Normal RV systolic function and size. Normal RA size, normal LA size.  Small pericardial effusion. No atrial level shunt by color flow doppler.   Micro Data:    Antimicrobials:    Interim History / Subjective:  Ambulatory on RA Partner at bedside   Objective   Blood pressure 133/88, pulse (!) 104, temperature 98.4 F (36.9 C), temperature source Oral, resp. rate 16, weight 87.7 kg, SpO2 97 %, unknown if currently breastfeeding.       No intake or output data in the 24 hours ending 10/18/20 1659 Filed Weights   10/18/20 1013  Weight: 87.7 kg    Examination: General: Well appearing adult F,  HENT: NCAT pink mm anicteric sclera Lungs: symmetrical chest expansion, even and unlabored on RA  Cardiovascular: tachycardic rate reg rhythm  Abdomen: Soft, post-operative  Extremities: No obvious joint deformity Neuro: Awake Alert no focal deficits GU: defer Psych: Flat affect. Avoidant eye contact. Paucity of speech   Resolved Hospital Problem list     Assessment & Plan:   Pulmonary Embolism without evidence of R heart strain on ECHO -recent CS, prior COVID  -slight tachycardia otherwise HDS, on RA, no syncope Possible pulmonary hemorrhage  -GGOs on CT R>L -- CXR on 12/21 R>L opacities in setting of COVID P -Eliquis, stable for discharge home  Mood disorder Hx psychosis, peri and post partum Anxiety -evaluated by psych prior to dc from recent admission for cesarean delivery who ordered hydroxyzine 25mg  TID PRN and recommend OP psych follow up P -encouraged OP psych  follow up   Best practice (evaluated daily)  Diet: Reg Pain/Anxiety/Delirium protocol (if indicated): -- VAP protocol (if indicated): -- DVT prophylaxis: Eliquis  GI prophylaxis: -- Glucose control: -- Mobility: -- Disposition: MAU to home  Goals of Care:  Last date of multidisciplinary goals of care discussion: -- Family and  staff present: -- Summary of discussion: -- Follow up goals of care discussion due: -- Code Status: Full   Labs   CBC: Recent Labs  Lab 10/18/20 1042  WBC 12.4*  HGB 10.2*  HCT 33.1*  MCV 73.9*  PLT 362    Basic Metabolic Panel: Recent Labs  Lab 10/18/20 1042  NA 137  K 3.5  CL 102  CO2 24  GLUCOSE 113*  BUN 7  CREATININE 0.58  CALCIUM 9.3   GFR: Estimated Creatinine Clearance: 114.8 mL/min (by C-G formula based on SCr of 0.58 mg/dL). Recent Labs  Lab 10/18/20 1042  WBC 12.4*    Liver Function Tests: Recent Labs  Lab 10/18/20 1042  AST 11*  ALT 14  ALKPHOS 63  BILITOT 1.2  PROT 7.7  ALBUMIN 3.1*   No results for input(s): LIPASE, AMYLASE in the last 168 hours. No results for input(s): AMMONIA in the last 168 hours.  ABG    Component Value Date/Time   TCO2 24 01/07/2011 0038     Coagulation Profile: Recent Labs  Lab 10/18/20 1042  INR 1.0    Cardiac Enzymes: No results for input(s): CKTOTAL, CKMB, CKMBINDEX, TROPONINI in the last 168 hours.  HbA1C: Hemoglobin A1C  Date/Time Value Ref Range Status  02/17/2020 12:52 PM 6.2 (A) 4.0 - 5.6 % Final   Hgb A1c MFr Bld  Date/Time Value Ref Range Status  10/10/2020 07:36 PM 5.4 4.8 - 5.6 % Final    Comment:    (NOTE) Pre diabetes:          5.7%-6.4%  Diabetes:              >6.4%  Glycemic control for   <7.0% adults with diabetes   02/04/2017 05:01 PM 6.8 (H) 4.8 - 5.6 % Final    Comment:             Pre-diabetes: 5.7 - 6.4          Diabetes: >6.4          Glycemic control for adults with diabetes: <7.0     CBG: No results for input(s): GLUCAP in the last 168 hours.  Review of Systems:    + recent surgery + SOB + Dull chest Pain + fatigue  - Cough, - runny nose, - stuffy nose - post nasal drip - HA - palpitations -edema  -loss of appetite  - abdominal pain   Past Medical History:  She,  has a past medical history of Anemia, Anxiety, Carpal tunnel syndrome, Chlamydia,  Chronic hypertension, COVID-19 (09/12/2020), Genital HSV, GERD (gastroesophageal reflux disease), Gestational diabetes, Headache(784.0), IBS (irritable bowel syndrome), Infection, Mood swings, Panic attack, Thyroid disease, and Trichomonas.   Surgical History:   Past Surgical History:  Procedure Laterality Date  . CESAREAN SECTION N/A 05/20/2013   Procedure: CESAREAN SECTION;  Surgeon: Brock Bad, MD;  Location: [facility];  Service: Obstetrics;  Laterality: N/A;  . CESAREAN SECTION N/A 04/06/2017   Procedure: CESAREAN SECTION;  Surgeon: Hermina Staggers, MD;  Location: [facility];  Service: Obstetrics;  Laterality: N/A;  . CESAREAN SECTION  10/10/2020   Procedure: CESAREAN SECTION;  Surgeon: Warden Fillers, MD;  Location: [facility] OR[facility];  Service: Obstetrics;;  . INDUCED ABORTION  2009  . VAGINAL DELIVERY  2007     Social History:   reports that she is a non-smoker but has been exposed to tobacco smoke. She has never used smokeless tobacco. She reports that she does not drink alcohol and does not use drugs.   Family History:  Her family history includes Anxiety disorder in her mother; Autism in an other family member; Bipolar disorder in her brother; Breast cancer in an other family member; Cancer in her maternal grandmother; Depression in her mother; Diabetes in her maternal grandmother; Hypertension in her maternal grandmother and paternal grandmother; Schizophrenia in an other family member.   Allergies Allergies  Allergen Reactions  . Latex Itching     Home Medications  Prior to Admission medications   Medication Sig Start Date End Date Taking? Authorizing Provider  apixaban (ELIQUIS) 5 MG TABS tablet Take 1 tablet (5 mg total) by mouth 2 (two) times daily. Start taking after starter pack is finished 11/17/20  Yes Crissie Reese, Mary Sella, MD  APIXABAN Everlene Balls) VTE STARTER PACK (10MG  AND 5MG ) Take as directed on package: start with two-5mg  tablets twice daily for 7 days. On day 8,  switch to one-5mg  tablet twice daily. 10/18/20  Yes , MD  ibuprofen (ADVIL) 800 MG tablet Take 1 tablet (800 mg total) by mouth every 6 (six) hours. 10/12/20  Yes Venora Maples, MD  oxyCODONE (OXY IR/ROXICODONE) 5 MG immediate release tablet Take 1-2 tablets (5-10 mg total) by mouth every 4 (four) hours as needed for moderate pain. 10/12/20  Yes Shirlean Mylar, MD  acetaminophen (TYLENOL) 325 MG tablet Take 2 tablets (650 mg total) by mouth every 4 (four) hours as needed for mild pain, moderate pain, fever or headache (pain >4, fever >101.5*F). 10/12/20   Shirlean Mylar, MD  guaifenesin (ROBITUSSIN) 100 MG/5ML syrup Take 200 mg by mouth 3 (three) times daily as needed for cough.    [provider]  norelgestromin-ethinyl estradiol (ORTHO EVRA) 150-35 MCG/24HR transdermal patch Place 1 patch onto the skin once a week. 10/12/20   Shirlean Mylar, MD  Prenatal Vit-Fe Fumarate-FA (PREPLUS) 27-1 MG TABS Take 1 tablet by mouth daily. 08/02/20   Shirlean Mylar, CNM  promethazine (PHENERGAN) 25 MG tablet Take 1 tablet (25 mg total) by mouth every 6 (six) hours as needed for nausea or vomiting. 09/13/20   Gerrit Heck, CNM  fluticasone (FLONASE) 50 MCG/ACT nasal spray Place 1 spray into both nostrils daily. Use for 2-3 weeks to help with the nasal congestion and ear pain Patient not taking: Reported on 01/21/2020 01/20/20 02/11/20  01/22/20, MD  sertraline (ZOLOFT) 25 MG tablet Take one tablet daily. If tolerating, increase to two tablets in one week. Patient not taking: Reported on 01/21/2020 01/18/20 02/11/20  01/20/20, DO  zolpidem (AMBIEN) 5 MG tablet Take 1 tablet (5 mg total) by mouth at bedtime as needed for sleep. Patient not taking: Reported on 01/21/2020 01/20/20 02/11/20  01/22/20, MD     CCT: n/a    02/13/20 MSN, AGACNP-BC [facility] Gwyneth Sprout If no answer, Tessie Fass 10/18/2020, 5:24 PM

## 2020-10-18 NOTE — MAU Note (Signed)
Patient provided with extensive teachings on the importance of adhering to the medication regimen of Apixaban provided directly to the patient by the transitions of care pharmacy.  Patient initially refused her first dose in the hospital, then eventually agreed to take it after Dr. Crissie Reese reported to the bedside.  Dr. Crissie Reese spoke with the patient to establish the patient's understanding of her condition and the risks associated with not adhering to her medication regimen.  Patient verbalized understanding and allowed RN to provide her with more education and information about follow up.  Patient's significant other was at the bedside and also verbalized understanding.

## 2020-10-18 NOTE — MAU Provider Note (Addendum)
History     [id number]  Arrival date and time: 10/18/20 [id number]    Chief Complaint  Patient presents with  . Chest Pain     HPI Melinda Jimenez is a 35 y.o. s/p RCS on 10/10/2020, who presents for chest pain and SOB.   Review of discharge summary from last admission on 10/10/2020: patient admitted 2020 due to premature rupture membranes and early labor.  She has not had prenatal care during this pregnancy.  Per discharge summary patient has a history of anxiety, panic attacks, and a psychotic episode, and was evaluated by psychiatry while admitted, she was determined stable for discharge and not meeting criteria for inpatient treatment for any reason.  Today reports had shortness of breath started yesterday morning.  Chest pain, early right-sided chest abdominally, with radiation to the upper.  Taking a deep breath makes the pain worse, nothing makes the pain better.  She denies fever, congestion. She endorses having intermittent headache but none currently, no vision changes.  She and her partner have both noticed a light cough with some mucus production, no blood in her sputum.  She has not noticed any swelling in her legs, has no history of a prior blood clot, she is not a smoker, she has not traveled recently, she has no family history of blood clots.  She has been having abdominal pain as well, she has been using her pain medicines for this to good effect but they have not been effective for her chest pain.  Still having some bleeding, passing small clots.    --/--/A POS (01/10 1934)  OB History    Gravida  10   Para  5   Term  3   Preterm  2   AB  5   Living  4     SAB  2   IAB  3   Ectopic      Multiple  0   Live Births  5           Past Medical History:  Diagnosis Date  . Anemia   . Anxiety    off meds with preg  . Carpal tunnel syndrome   . Chlamydia   . Chronic hypertension   . COVID-19 09/12/2020  . Genital HSV    Last OB: > 6912yr ago; Valtrex 500 mg  po prn outbreaks  . GERD (gastroesophageal reflux disease)   . Gestational diabetes    diet controlled  . Headache(784.0)   . IBS (irritable bowel syndrome)   . Infection    UTI  . Mood swings   . Panic attack   . Thyroid disease   . Trichomonas     Past Surgical History:  Procedure Laterality Date  . CESAREAN SECTION N/A 05/20/2013   Procedure: CESAREAN SECTION;  Surgeon: [name], MD;  Location: [facility];  Service: Obstetrics;  Laterality: N/A;  . CESAREAN SECTION N/A 04/06/2017   Procedure: CESAREAN SECTION;  Surgeon: Hermina StaggersErvin, Michael L, MD;  [facility];  Service: Obstetrics;  Laterality: N/A;  . CESAREAN SECTION  10/10/2020   Procedure: CESAREAN SECTION;  Surgeon: Warden FillersBass, Lawrence A, MD;  Location: [facility];  Service: Obstetrics;;  . INDUCED ABORTION  2009  . VAGINAL DELIVERY  2007    Family History  Problem Relation Age of Onset  . Depression Mother   . Anxiety disorder Mother   . Breast cancer Other   . Bipolar disorder Brother   . Diabetes Maternal Grandmother   .  Cancer Maternal Grandmother   . Hypertension Maternal Grandmother   . Hypertension Paternal Grandmother   . Autism Other   . Schizophrenia Other     Social History   Socioeconomic History  . Marital status: Single    Spouse name: Not on file  . Number of children: 1  . Years of education: Not on file  . Highest education level: Not on file  Occupational History  . Occupation: Unemployed  Tobacco Use  . Smoking status: Passive Smoke Exposure - Never Smoker  . Smokeless tobacco: Never Used  . Tobacco comment: was not a daily smoker, tried it  Vaping Use  . Vaping Use: Never used  Substance and Sexual Activity  . Alcohol use: No    Comment: Twice a month  . Drug use: No  . Sexual activity: Yes    Birth control/protection: None    Comment: Patient is interested in implant  Other Topics Concern  . Not on file  Social History Narrative  . Not on file   Social Determinants  of Health   Financial Resource Strain: Not on file  Food Insecurity: Not on file  Transportation Needs: Not on file  Physical Activity: Not on file  Stress: Not on file  Social Connections: Not on file  Intimate Partner Violence: Not on file    Allergies  Allergen Reactions  . Latex Itching    No current facility-administered medications on file prior to encounter.   Current Outpatient Medications on File Prior to Encounter  Medication Sig Dispense Refill  . ibuprofen (ADVIL) 800 MG tablet Take 1 tablet (800 mg total) by mouth every 6 (six) hours. 30 tablet 0  . oxyCODONE (OXY IR/ROXICODONE) 5 MG immediate release tablet Take 1-2 tablets (5-10 mg total) by mouth every 4 (four) hours as needed for moderate pain. 20 tablet 0  . acetaminophen (TYLENOL) 325 MG tablet Take 2 tablets (650 mg total) by mouth every 4 (four) hours as needed for mild pain, moderate pain, fever or headache (pain >4, fever >101.5*F). 30 tablet 0  . guaifenesin (ROBITUSSIN) 100 MG/5ML syrup Take 200 mg by mouth 3 (three) times daily as needed for cough.    . norelgestromin-ethinyl estradiol (ORTHO EVRA) 150-35 MCG/24HR transdermal patch Place 1 patch onto the skin once a week. 3 patch 12  . Prenatal Vit-Fe Fumarate-FA (PREPLUS) 27-1 MG TABS Take 1 tablet by mouth daily. 30 tablet 13  . promethazine (PHENERGAN) 25 MG tablet Take 1 tablet (25 mg total) by mouth every 6 (six) hours as needed for nausea or vomiting. 30 tablet 1  . [DISCONTINUED] fluticasone (FLONASE) 50 MCG/ACT nasal spray Place 1 spray into both nostrils daily. Use for 2-3 weeks to help with the nasal congestion and ear pain (Patient not taking: Reported on 01/21/2020) 9.9 mL 2  . [DISCONTINUED] sertraline (ZOLOFT) 25 MG tablet Take one tablet daily. If tolerating, increase to two tablets in one week. (Patient not taking: Reported on 01/21/2020) 60 tablet 1  . [DISCONTINUED] zolpidem (AMBIEN) 5 MG tablet Take 1 tablet (5 mg total) by mouth at bedtime as  needed for sleep. (Patient not taking: Reported on 01/21/2020) 4 tablet 0     ROS Pertinent positives and negative per HPI, all others reviewed and negative  Physical Exam   BP 133/88 (BP Location: Left Arm)   Pulse (!) 104   Temp 98.4 F (36.9 C) (Oral)   Resp 16   Wt 87.7 kg   LMP  (LMP Unknown)  SpO2 97%   BMI 30.30 kg/m   Patient Vitals for the past 24 hrs:  BP Temp Temp src Pulse Resp SpO2 Weight  10/18/20 1550 133/88 98.4 F (36.9 C) Oral (!) 104 16 97 % --  10/18/20 1355 (!) 130/95 -- -- (!) 102 -- -- --  10/18/20 1225 134/82 -- -- 93 -- -- --  10/18/20 1201 (!) 148/100 -- -- (!) 104 -- -- --  10/18/20 1131 (!) 144/94 -- -- 92 -- -- --  10/18/20 1116 (!) 141/95 -- -- 86 -- -- --  10/18/20 1105 (!) 142/99 -- -- 97 -- 97 % --  10/18/20 1030 (!) 140/101 -- -- 97 -- 97 % --  10/18/20 1022 (!) 131/91 98.9 F (37.2 C) -- 91 19 -- --  10/18/20 1013 -- -- -- -- -- -- 87.7 kg    Physical Exam Vitals reviewed.  Constitutional:      General: She is not in acute distress.    Appearance: She is well-developed and well-nourished. She is not diaphoretic.  Eyes:     General: No scleral icterus. Cardiovascular:     Rate and Rhythm: Normal rate and regular rhythm.     Heart sounds: Normal heart sounds. No murmur heard. No gallop.   Pulmonary:     Effort: Pulmonary effort is normal. No respiratory distress.     Breath sounds: Normal breath sounds. No wheezing, rhonchi or rales.  Abdominal:     General: There is no distension.     Palpations: Abdomen is soft.     Tenderness: There is abdominal tenderness. There is guarding. There is no rebound.     Comments: Tenderness to palpation in the RUQ with some guarding  Musculoskeletal:        General: No edema.     Comments: No palpable cord in either leg, visually R leg is questionably mildly larger than the L  Skin:    General: Skin is warm and dry.  Neurological:     General: No focal deficit present.     Mental Status:  She is alert.     Coordination: Coordination normal.  Psychiatric:        Mood and Affect: Mood is anxious.      Cervical Exam    Bedside Ultrasound Not done  My interpretation: n/a  FHT n/a  Labs Results for orders placed or performed during the hospital encounter of 10/18/20 (from the past 24 hour(s))  CBC     Status: Abnormal   Collection Time: 10/18/20 10:42 AM  Result Value Ref Range   WBC 12.4 (H) 4.0 - 10.5 K/uL   RBC 4.48 3.87 - 5.11 MIL/uL   Hemoglobin 10.2 (L) 12.0 - 15.0 g/dL   HCT 16.1 (L) 09.6 - 04.5 %   MCV 73.9 (L) 80.0 - 100.0 fL   MCH 22.8 (L) 26.0 - 34.0 pg   MCHC 30.8 30.0 - 36.0 g/dL   RDW 40.9 (H) 81.1 - 91.4 %   Platelets 362 150 - 400 K/uL   nRBC 0.0 0.0 - 0.2 %  Comprehensive metabolic panel     Status: Abnormal   Collection Time: 10/18/20 10:42 AM  Result Value Ref Range   Sodium 137 135 - 145 mmol/L   Potassium 3.5 3.5 - 5.1 mmol/L   Chloride 102 98 - 111 mmol/L   CO2 24 22 - 32 mmol/L   Glucose, Bld 113 (H) 70 - 99 mg/dL   BUN 7 6 - 20 mg/dL  Creatinine, Ser 0.58 0.44 - 1.00 mg/dL   Calcium 9.3 8.9 - 16.110.3 mg/dL   Total Protein 7.7 6.5 - 8.1 g/dL   Albumin 3.1 (L) 3.5 - 5.0 g/dL   AST 11 (L) 15 - 41 U/L   ALT 14 0 - 44 U/L   Alkaline Phosphatase 63 38 - 126 U/L   Total Bilirubin 1.2 0.3 - 1.2 mg/dL   GFR, Estimated >09>60 >60>60 mL/min   Anion gap 11 5 - 15  Protime-INR     Status: None   Collection Time: 10/18/20 10:42 AM  Result Value Ref Range   Prothrombin Time 12.8 11.4 - 15.2 seconds   INR 1.0 0.8 - 1.2  APTT     Status: None   Collection Time: 10/18/20 10:42 AM  Result Value Ref Range   aPTT 33 24 - 36 seconds  Troponin I (High Sensitivity)     Status: None   Collection Time: 10/18/20 10:42 AM  Result Value Ref Range   Troponin I (High Sensitivity) 13 <18 ng/L  Brain natriuretic peptide     Status: None   Collection Time: 10/18/20  2:04 PM  Result Value Ref Range   B Natriuretic Peptide 15.7 0.0 - 100.0 pg/mL     Imaging CT Angio Chest PE W and/or Wo Contrast  Result Date: 10/18/2020 CLINICAL DATA:  PE suspected positive D-dimers 8 days postpartum, pleuritic right-sided chest pain with radiation of the right shoulder. EXAM: CT ANGIOGRAPHY CHEST WITH CONTRAST TECHNIQUE: Multidetector CT imaging of the chest was performed using the standard protocol during bolus administration of intravenous contrast. Multiplanar CT image reconstructions and MIPs were obtained to evaluate the vascular anatomy. CONTRAST:  80mL OMNIPAQUE IOHEXOL 350 MG/ML SOLN COMPARISON:  Same day chest radiograph. FINDINGS: Cardiovascular: Satisfactory opacification of the pulmonary arteries to the segmental level. Central filling defects in the bilateral lower lobar pulmonary arteries with extension into the segmental and subsegmental branches. The RV to LV ratio is 0.8. Ratio of the main pulmonary artery to the ascending aorta is 0.9. Cardiomegaly. No pericardial effusion. Mediastinum/Nodes: No enlarged mediastinal, hilar, or axillary lymph nodes. Thyroid gland, trachea, and esophagus demonstrate no significant findings. Lungs/Pleura: Wedge-shaped in nodular areas of consolidation as well as ground-glass in the right lower lobe band like opacity in the right middle lobe. Upper Abdomen: No acute abnormality. Musculoskeletal: No chest wall abnormality. No acute or significant osseous findings. Review of the MIP images confirms the above findings. IMPRESSION: 1. Positive examination for pulmonary embolism. Central filling defects in the bilateral lower lobar pulmonary arteries with extension into the segmental and subsegmental branches. 2. The RV to LV ratio is 0.85 and the MPA to Ao ratio is 0.9. Findings which are concerning for early right heart strain. 3. Wedge-shaped and nodular areas of consolidation and ground-glass in the right lower lobe, which likely represent pulmonary hemorrhage. These results were called by telephone at the time of  interpretation on 10/18/2020 at 2:06 pm to provider [place] Crance , who verbally acknowledged these results. Electronically Signed   By: Maudry MayhewJeffrey  Waltz MD   On: 10/18/2020 14:07   DG Chest Portable 1 View  Result Date: 10/18/2020 CLINICAL DATA:  Chest pain, 8 days postpartum EXAM: PORTABLE CHEST 1 VIEW COMPARISON:  09/20/2020 FINDINGS: Similar low lung volumes with bibasilar opacities concerning for continued areas of atelectasis or pneumonia. Right hemidiaphragm is elevated. Heart is enlarged. No edema, effusion or pneumothorax. Trachea midline. Curvature of the spine noted may be positional. IMPRESSION: Persistent low lung  volumes with bibasilar atelectasis versus pneumonia. Electronically Signed   By: Judie Petit.  Shick M.D.   On: 10/18/2020 10:49   ECHOCARDIOGRAM COMPLETE  Result Date: 10/18/2020    ECHOCARDIOGRAM REPORT   Patient Name:   NIANG MITCHELTREE Date of Exam: 10/18/2020 Medical Rec #:  [id number]     Height:       67.0 in Accession #:    [id number]    Weight:       193.4 lb Date of Birth:  1986/10/01     BSA:          1.994 m Patient Age:    34 years      BP:           130/95 mmHg Patient Gender: F             HR:           104 bpm. Exam Location:  Inpatient Procedure: 2D Echo, Color Doppler and Cardiac Doppler Indications:    I26.02 Pulmonary embolus  History:        Patient has no prior history of Echocardiogram examinations.                 Risk Factors:COVID+ 09/12/20. 8 days post cesarean delivery.  Sonographer:    Irving Burton Senior RDCS Referring Phys: [id number] Tansy Lorek M Viliami Bracco  Sonographer Comments: Scanned sitting upright at patient's request. IMPRESSIONS  1. Left ventricular ejection fraction, by estimation, is 55 to 60%. The left ventricle has normal function. The left ventricle has no regional wall motion abnormalities. Left ventricular diastolic parameters are consistent with Grade I diastolic dysfunction (impaired relaxation).  2. Right ventricular systolic function is normal. The right ventricular  size is normal.  3. A small pericardial effusion is present.  4. The mitral valve is normal in structure. No evidence of mitral valve regurgitation. No evidence of mitral stenosis.  5. The aortic valve is tricuspid. Aortic valve regurgitation is not visualized. No aortic stenosis is present.  6. The inferior vena cava is normal in size with greater than 50% respiratory variability, suggesting right atrial pressure of 3 mmHg. FINDINGS  Left Ventricle: Left ventricular ejection fraction, by estimation, is 55 to 60%. The left ventricle has normal function. The left ventricle has no regional wall motion abnormalities. The left ventricular internal cavity size was normal in size. There is  no left ventricular hypertrophy. Left ventricular diastolic parameters are consistent with Grade I diastolic dysfunction (impaired relaxation). Right Ventricle: The right ventricular size is normal. Right ventricular systolic function is normal. Left Atrium: Left atrial size was normal in size. Right Atrium: Right atrial size was normal in size. Pericardium: A small pericardial effusion is present. Mitral Valve: The mitral valve is normal in structure. No evidence of mitral valve regurgitation. No evidence of mitral valve stenosis. Tricuspid Valve: The tricuspid valve is normal in structure. Tricuspid valve regurgitation is trivial. No evidence of tricuspid stenosis. Aortic Valve: The aortic valve is tricuspid. Aortic valve regurgitation is not visualized. No aortic stenosis is present. Pulmonic Valve: The pulmonic valve was normal in structure. Pulmonic valve regurgitation is not visualized. No evidence of pulmonic stenosis. Aorta: The aortic root is normal in size and structure. Venous: The inferior vena cava is normal in size with greater than 50% respiratory variability, suggesting right atrial pressure of 3 mmHg. IAS/Shunts: No atrial level shunt detected by color flow Doppler.  LEFT VENTRICLE PLAX 2D LVIDd:         4.60 cm LVIDs:  3.10 cm LV PW:         1.00 cm LV IVS:        0.90 cm LVOT diam:     2.20 cm LV SV:         68 LV SV Index:   34 LVOT Area:     3.80 cm  RIGHT VENTRICLE RV S prime:     18.10 cm/s TAPSE (M-mode): 1.8 cm LEFT ATRIUM           Index LA diam:      3.20 cm 1.60 cm/m LA Vol (A4C): 47.8 ml 23.97 ml/m  AORTIC VALVE LVOT Vmax:   89.40 cm/s LVOT Vmean:  65.900 cm/s LVOT VTI:    0.179 m  AORTA Ao Root diam: 3.00 cm Ao Asc diam:  3.00 cm TRICUSPID VALVE TR Peak grad:   28.9 mmHg TR Vmax:        269.00 cm/s  SHUNTS Systemic VTI:  0.18 m Systemic Diam: 2.20 cm Olga Millers MD Electronically signed by Olga Millers MD Signature Date/Time: 10/18/2020/4:01:15 PM    Final     MAU Course  Procedures  Lab Orders     CBC     Comprehensive metabolic panel     Protime-INR     APTT     Brain natriuretic peptide Meds ordered this encounter  Medications  . ibuprofen (ADVIL) tablet 800 mg  . cyclobenzaprine (FLEXERIL) tablet 10 mg  . iohexol (OMNIPAQUE) 350 MG/ML injection 80 mL  . FOLLOWED BY Linked Order Group   . apixaban (ELIQUIS) tablet 10 mg   . apixaban (ELIQUIS) tablet 5 mg  . APIXABAN (ELIQUIS) VTE STARTER PACK (10MG  AND 5MG )    Sig: Take as directed on package: start with two-5mg  tablets twice daily for 7 days. On day 8, switch to one-5mg  tablet twice daily.    Dispense:  1 each    Refill:  0  . apixaban (ELIQUIS) 5 MG TABS tablet    Sig: Take 1 tablet (5 mg total) by mouth 2 (two) times daily. Start taking after starter pack is finished    Dispense:  60 tablet    Refill:  1    Imaging Orders     DG Chest Portable 1 View     CT Angio Chest PE W and/or Wo Contrast  MDM moderate  Assessment and Plan  #Chest pain #Shortness of breath #S/p repeat cesarean, post partum day 8 #Post partum hypertension Presenting with 1 day history of chest pain and shortness of breath as well right upper quadrant pain with radiation to the right shoulder and newly elevated blood pressures.   Differential remains broad at this point.  Postpartum preeclampsia is a possibility, has had mild range pressures since presenting, right upper quadrant pain is significant, and preeclampsia labs are pending.  Pulmonary embolism is also a significant concern and she is recently postop and the nature of her pain.  Arguing against this is normal O2 saturation normal borderline heart bradycardia.  Will check chest x-ray, EKG, troponin, CTPA.  Low suspicion for pneumonia, but will obtain chest x-ray.  Patient previously had COVID on December 13.  Low suspicion for gallbladder disease but labs are pending.  Symptoms somewhat consistent with diaphragm irritation, possibly due to residual air in her abdomen.  Will treat symptomatically with ibuprofen and Flexeril, further work-up pending results of the above. 10:49 AM  CXR (my interpretation): Low lung volumes, trachea midline, clear bilaterally with no evidence of effusion, clear lung  fields without any evidence of consolidation, distended colon ECG (my interpretation): T wave Arin review of prior EKG shows this is a normal variant, otherwise normal EKG with no abnormal findings.  Patient reports symptoms are unchanged. On review of chart patient has had numerous visits to the emergency department for complaints of chest pain as well as an evaluation with cardiology during which they were unable to do a cardiac work-up as patient was found to be pregnant and appeared to be actively psychotic in 07/2020. X-ray and EKG have returned normal and are reassuring. Lab work-up similarly reassuring with normal CBC, CMP, troponin. Blood pressures are in the mild range but my suspicion for postpartum preeclampsia is low despite right upper quadrant pain with normal lab work-up, review of chart shows she has also been intermittently hypertensive at prior ED visits. My suspicion for pulmonary embolism is similarly low, however patient is currently in a high risk category being  postsurgical and also reports feeling mostly immobile since returning home. Despite low suspicion cannot definitively rule out PE and patient is also intermittently tachycardic and hypertensive, therefore will obtain CTPA. Discussed with patient that this is negative we will done an extensive work-up without finding any life-threatening causes. We will also plan to give antihypertensive medicines if discharged. 12:34 PM  Patient found to have bilateral pulmonary emboli, DVT provoked in the setting of recent surgery.  CT read notes some evidence of pulmonary hemorrhage, consulted with PCCM who evaluated the patient and did not find any contraindication to outpatient anticoagulation management.  Also discussed with vascular surgery via secure chat (Dr. Edilia Bo), on their review with another radiologist they are not convinced that the imaging findings are consistent with hemorrhage, agree with anticoagulating patient. CT read also notes possibility of right heart strain, BNP was obtained which was normal and TTE also showed no evidence of right heart strain.  Overall low risk of morbidity and mortality, acceptible for outpatient management.  Able to obtain 45-month supply from [facility] Eliquis.  Discussed importance of taking full treatment course to avoid further complications.  Discussed that if she has hemoptysis, large amounts of vaginal bleeding, worsening shortness of breath, worsening chest pain, or any other concerning symptoms she should return to the MAU for reevaluation.  All questions answered.  Discharged home in stable condition.  Venora Maples, MD/MPH 10/18/20 5:10 PM   Addendum: Patient initially refusing to take first dose of Eliquis prior to discharge. Came to discuss with patient as staff worried about patient compliance at home. Patient clearly demonstrated competence to me regarding rationale behind treatment and possible consequences of not taking her  medicine including death. Subsequently took the initial dose of Eliqius and was discharged home. Will send message to primary care at [facility] for follow up, also encouraged patient to contact PCP for follow up.   5:28 PM

## 2020-10-18 NOTE — Discharge Instructions (Signed)
Proceed with surgery.  Assessment with chest pain and shortness of breath and you are found to have an acute pulmonary embolism.  This is a blood clot in your lungs.  This is likely due to your recent cesarean surgery.  We have prescribed you blood thinning medications that she needs to take for 3 months to help dissolve the clot and prevent further complications.  It is extremely important that she take the medication as prescribed and complete the full course of the medication even if you feel better before it is completed.    Pulmonary Embolism  A pulmonary embolism (PE) is a sudden blockage or decrease of blood flow in one or both lungs that happens when a clot travels into the arteries of the lung (pulmonary arteries). Most blockages come from a blood clot that forms in the vein of a leg or arm (deep vein thrombosis, DVT) and travels to the lungs. A clot is blood that has thickened into a gel or solid. PE is a dangerous and life-threatening condition that needs to be treated right away. What are the causes? This condition is usually caused by a blood clot that forms in a vein and moves to the lungs. In rare cases, it may be caused by air, fat, part of a tumor, or other tissue that moves through the veins and into the lungs. What increases the risk? The following factors may make you more likely to develop this condition:  Experiencing a traumatic injury, such as breaking a hip or leg.  Having: ? A spinal cord injury. ? Major surgery, especially hip or knee replacement, or surgery on parts of the nervous system or on the abdomen. ? A stroke. ? A blood clotting disease. ? Long-term (chronic) lung or heart disease. ? Cancer, especially if you are being treated with chemotherapy. ? A central venous catheter.  Taking medicines that contain estrogen. These include birth control pills and hormone replacement therapy.  Being: ? Pregnant. ? In the period of time after your baby is delivered  (postpartum). ? Older than age 23. ? Overweight. ? A smoker, especially if you have other risks. ? Not very active (sedentary), not being able to move at all, or spending long periods sitting, such as travel over 6 hours. You are also at a greater risk if you have a leg in a cast or splint. What are the signs or symptoms? Symptoms of this condition usually start suddenly and include:  Shortness of breath during activity or at rest.  Coughing, coughing up blood, or coughing up bloody mucus.  Chest pain, back pain, or shoulder blade pain that gets worse with deep breaths.  Rapid or irregular heartbeat.  Feeling light-headed or dizzy, or fainting.  Feeling anxious.  Pain and swelling in a leg. This is a symptom of DVT, which can lead to PE. How is this diagnosed? This condition may be diagnosed based on your medical history, a physical exam, and tests. Tests may include:  Blood tests.  An ECG (electrocardiogram) of the heart.  A CT pulmonary angiogram. This test checks blood flow in and around your lungs.  A ventilation-perfusion scan, also called a lung VQ scan. This test measures air flow and blood flow to the lungs.  An ultrasound to check for a DVT. How is this treated? Treatment for this condition depends on many factors, such as the cause of your PE, your risk for bleeding or developing more clots, and other medical conditions you may have. Treatment  aims to stop blood clots from forming or growing larger. In some cases, treatment may be aimed at breaking apart or removing the blood clot. Treatment may include:  Medicines, such as: ? Blood thinning medicines, also called anticoagulants, to stop clots from forming and growing. ? Medicines that break apart clots (thrombolytics).  Procedures, such as: ? Using a flexible tube to remove a blood clot (embolectomy) or to deliver medicine to destroy it (catheter-directed thrombolysis). ? Surgery to remove the clot (surgical  embolectomy). This is rare. You may need a combination of immediate, long-term, and extended treatments. Your treatment may continue for several months (maintenance therapy) or longer depending on your medical conditions. You and your health care provider will work together to choose the treatment program that is best for you. Follow these instructions at home: Medicines  Take over-the-counter and prescription medicines only as told by your health care provider.  If you are taking blood thinners: ? Talk with your health care provider before you take any medicines that contain aspirin or NSAIDs, such as ibuprofen. These medicines increase your risk for dangerous bleeding. ? Take your medicine exactly as told, at the same time every day. ? Avoid activities that could cause injury or bruising, and follow instructions about how to prevent falls. ? Wear a medical alert bracelet or carry a card that lists what medicines you take.  Understand what foods and drugs interact with any medicines that you are taking. General instructions  Ask your health care provider when you may return to your normal activities. Avoid sitting or lying for a long time without moving.  Maintain a healthy weight. Ask your health care provider what weight is healthy for you.  Do not use any products that contain nicotine or tobacco, such as cigarettes, e-cigarettes, and chewing tobacco. If you need help quitting, ask your health care provider.  Talk with your health care provider about any travel plans. It is important to make sure that you are still able to take your medicine while traveling.  Keep all follow-up visits as told by your health care provider. This is important. Where to find more information  American Lung Association: www.lung.org  Centers for Disease Control and Prevention: FootballExhibition.com.br Contact a health care provider if:  You missed a dose of your blood thinner medicine. Get help right away if  you:  Have: ? New or increased pain, swelling, warmth, or redness in an arm or leg. ? Shortness of breath that gets worse during activity or at rest. ? A fever. ? Worsening chest pain. ? A rapid or irregular heartbeat. ? A severe headache. ? Vision changes. ? A serious fall or accident, or you hit your head. ? Stomach pain. ? Blood in your vomit, stool, or urine. ? A cut that will not stop bleeding.  Cough up blood.  Feel light-headed or dizzy, and that feeling does not go away.  Cannot move your arms or legs.  Are confused or have memory loss. These symptoms may represent a serious problem that is an emergency. Do not wait to see if the symptoms will go away. Get medical help right away. Call your local emergency services (911 in the [place] Do not drive yourself to the hospital. Summary  A pulmonary embolism (PE) is a serious and potentially life-threatening condition, in which a blood clot from one part of the body (deep vein thrombosis, DVT) travels to the arteries of the lung, causing a sudden blockage or decrease of blood flow to  the lungs. This may result in shortness of breath, chest pain, dizziness, and fainting.  Treatments for this condition usually include medicines to thin your blood (anticoagulants) or medicines to break apart blood clots (thrombolytics).  If you are given blood thinners, take your medicine exactly as told by your health care provider, at the same time every day. This is important.  Understand what foods and drugs interact with any medicines that you are taking.  If you have signs of PE or DVT, call your local emergency services (911 in the [place] This information is not intended to replace advice given to you by your health care provider. Make sure you discuss any questions you have with your health care provider. Document Revised: 07/31/2019 Document Reviewed: 07/31/2019 Elsevier Patient Education  2021 [employer].

## 2020-10-18 NOTE — Progress Notes (Signed)
Echocardiogram 2D Echocardiogram has been performed.  Warren Lacy Latissa Frick 10/18/2020, 3:52 PM

## 2020-10-18 NOTE — MAU Note (Signed)
Patient removed BP cuff, encouraged to keep it on.  Patient states it is uncomfortable but will allow RN to come get BP in the room as long as she doesn't have to keep the cuff on.

## 2020-10-18 NOTE — MAU Note (Signed)
Patient PP C/S 10/10/20, states she has had to take medication for her pain around the clock since her procedure.  Her VB had started to lighten but has since become heavier.  Approximately 24 hours ago started having SOB & pain in her upper right chest that radiates to her right shoulder.  Also having lower back pain that is constant.

## 2020-10-19 ENCOUNTER — Telehealth: Payer: Self-pay

## 2020-10-19 NOTE — Telephone Encounter (Signed)
lvm for the patient to call back

## 2020-10-19 NOTE — Telephone Encounter (Signed)
-----   Message from [place], [place] sent at 10/19/2020  4:49 PM EST ----- Can you please schedule appt for pt w/Wallace if she doesn't have anything Zonia Kief will see her

## 2020-10-21 ENCOUNTER — Encounter (HOSPITAL_COMMUNITY): Payer: Self-pay | Admitting: Family Medicine

## 2020-10-21 ENCOUNTER — Other Ambulatory Visit: Payer: Self-pay

## 2020-10-21 ENCOUNTER — Inpatient Hospital Stay (HOSPITAL_COMMUNITY)
Admission: AD | Admit: 2020-10-21 | Discharge: 2020-10-21 | Disposition: A | Discharge status: Home / Self Care | Payer: Medicaid Other | Attending: Family Medicine | Admitting: Family Medicine

## 2020-10-21 ENCOUNTER — Inpatient Hospital Stay (HOSPITAL_COMMUNITY): Payer: Medicaid Other

## 2020-10-21 DIAGNOSIS — Z5329 Procedure and treatment not carried out because of patient's decision for other reasons: Secondary | ICD-10-CM | POA: Insufficient documentation

## 2020-10-21 DIAGNOSIS — Z7901 Long term (current) use of anticoagulants: Secondary | ICD-10-CM | POA: Diagnosis not present

## 2020-10-21 DIAGNOSIS — Z4889 Encounter for other specified surgical aftercare: Secondary | ICD-10-CM | POA: Diagnosis not present

## 2020-10-21 DIAGNOSIS — N939 Abnormal uterine and vaginal bleeding, unspecified: Secondary | ICD-10-CM | POA: Insufficient documentation

## 2020-10-21 DIAGNOSIS — I2699 Other pulmonary embolism without acute cor pulmonale: Secondary | ICD-10-CM | POA: Diagnosis not present

## 2020-10-21 DIAGNOSIS — Z8616 Personal history of COVID-19: Secondary | ICD-10-CM | POA: Insufficient documentation

## 2020-10-21 LAB — CBC
HCT: 33.9 % — ABNORMAL LOW (ref 36.0–46.0)
Hemoglobin: 10.7 g/dL — ABNORMAL LOW (ref 12.0–15.0)
MCH: 23.3 pg — ABNORMAL LOW (ref 26.0–34.0)
MCHC: 31.6 g/dL (ref 30.0–36.0)
MCV: 73.9 fL — ABNORMAL LOW (ref 80.0–100.0)
Platelets: 495 10*3/uL — ABNORMAL HIGH (ref 150–400)
RBC: 4.59 MIL/uL (ref 3.87–5.11)
RDW: 16.9 % — ABNORMAL HIGH (ref 11.5–15.5)
WBC: 7.4 10*3/uL (ref 4.0–10.5)
nRBC: 0 % (ref 0.0–0.2)

## 2020-10-21 LAB — TYPE AND SCREEN
ABO/RH(D): A POS
Antibody Screen: NEGATIVE

## 2020-10-21 MED ORDER — MEGESTROL ACETATE 40 MG PO TABS: 40.0000 mg | ORAL_TABLET | Freq: Two times a day (BID) | ORAL | 5 refills | Status: DC

## 2020-10-21 MED ORDER — FERROUS GLUCONATE 324 (38 FE) MG PO TABS: 324.0000 mg | ORAL_TABLET | ORAL | 3 refills | Status: DC
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
## 2020-10-21 NOTE — Discharge Instructions (Signed)
Start taking Megace 40mg  twice a day Start taking Iron supplement every other day.  Your blood counts are normal. Your bleeding is likely from the blood thinner you are taking. It is very important to continue taking the blood thinner because of the blood clots in your lung - this is called a pulmonary embolism. If you stop taking the blood thinners, the blood clots can become worse and you can die.  Please return for continued heavy bleeding, pain or problems with your incision.

## 2020-10-21 NOTE — MAU Provider Note (Signed)
History     CSN: [id number]  Arrival date and time: 10/21/20 1301   Event Date/Time   First Provider Initiated Contact with Patient 10/21/20 1347      Chief Complaint  Patient presents with  . Vaginal Bleeding  . Bleeding from incision   HPI This is a 35 yo who is 11 days postop from a cesarean delivery. She came to MAU 3 days ago, diagnosed with a PE and send home on eliquis. She reports heavy vaginal bleeding since that time, along with bleeding from her c/s incision site. She feels weak, tired. She reports changing her pad approximately 7 times over night. No palliating or provoking factors. Symptoms are worsening. She has not missed any doses of her Eliquis.   In review of chart, she was prescribed Otho Evra, but she hasn't started using it.   OB History    Gravida  10   Para  5   Term  3   Preterm  2   AB  5   Living  4     SAB  2   IAB  3   Ectopic      Multiple  0   Live Births  5           Past Medical History:  Diagnosis Date  . Anemia   . Anxiety    off meds with preg  . Carpal tunnel syndrome   . Chlamydia   . Chronic hypertension   . COVID-19 09/12/2020  . Genital HSV    Last OB: > 21yr ago; Valtrex 500 mg po prn outbreaks  . GERD (gastroesophageal reflux disease)   . Gestational diabetes    diet controlled  . Headache(784.0)   . IBS (irritable bowel syndrome)   . Infection    UTI  . Mood swings   . Panic attack   . Thyroid disease   . Trichomonas     Past Surgical History:  Procedure Laterality Date  . CESAREAN SECTION N/A 05/20/2013   Procedure: CESAREAN SECTION;  Surgeon: Brock Bad, MD;  Location: [facility] ORS;  Service: Obstetrics;  Laterality: N/A;  . CESAREAN SECTION N/A 04/06/2017   Procedure: CESAREAN SECTION;  Surgeon: Hermina Staggers, MD;  Location: [facility] SU[facility];  Service: Obstetrics;  Laterality: N/A;  . CESAREAN SECTION  10/10/2020   Procedure: CESAREAN SECTION;  Surgeon: Warden Fillers, MD;  Location: [facility] LD  ORS;  Service: Obstetrics;;  . INDUCED ABORTION  2009  . VAGINAL DELIVERY  2007    Family History  Problem Relation Age of Onset  . Depression Mother   . Anxiety disorder Mother   . Breast cancer Other   . Bipolar disorder Brother   . Diabetes Maternal Grandmother   . Cancer Maternal Grandmother   . Hypertension Maternal Grandmother   . Hypertension Paternal Grandmother   . Autism Other   . Schizophrenia Other     Social History   Tobacco Use  . Smoking status: Passive Smoke Exposure - Never Smoker  . Smokeless tobacco: Never Used  . Tobacco comment: was not a daily smoker, tried it  Vaping Use  . Vaping Use: Never used  Substance Use Topics  . Alcohol use: No    Comment: Twice a month  . Drug use: No    Allergies:  Allergies  Allergen Reactions  . Latex Itching    No medications prior to admission.    Review of Systems Physical Exam   Blood pressure Marland Kitchen)  128/101, pulse 84, temperature 98.4 F (36.9 C), temperature source Oral, resp. rate 18, weight 85.6 kg, SpO2 97 %, unknown if currently breastfeeding.  Physical Exam Vitals and nursing note reviewed. Exam conducted with a chaperone present.  Constitutional:      Appearance: Normal appearance.  HENT:     Head: Normocephalic and atraumatic.  Cardiovascular:     Rate and Rhythm: Normal rate and regular rhythm.     Pulses: Normal pulses.     Heart sounds: Normal heart sounds.  Pulmonary:     Effort: Pulmonary effort is normal.     Breath sounds: Normal breath sounds.  Abdominal:     General: Abdomen is flat. There is no distension.     Palpations: Abdomen is soft. There is no mass.     Tenderness: There is no abdominal tenderness.     Hernia: No hernia is present.     Comments: Incision clean and dry. No bleeding for incision. No erythema to suggest infection. No drainage to suggest abscess. Not able to express blood from wound.  Genitourinary:    Comments: Patient refused exam Skin:    General: Skin  is warm and dry.     Capillary Refill: Capillary refill takes less than 2 seconds.  Neurological:     General: No focal deficit present.     Mental Status: She is alert and oriented to person, place, and time.  Psychiatric:        Mood and Affect: Mood normal.        Behavior: Behavior normal.        Thought Content: Thought content normal.        Judgment: Judgment normal.    Results for orders placed or performed during the hospital encounter of 10/21/20 (from the past 24 hour(s))  CBC     Status: Abnormal   Collection Time: 10/21/20  1:57 PM  Result Value Ref Range   WBC 7.4 4.0 - 10.5 K/uL   RBC 4.59 3.87 - 5.11 MIL/uL   Hemoglobin 10.7 (L) 12.0 - 15.0 g/dL   HCT 50.0 (L) 93.8 - 18.2 %   MCV 73.9 (L) 80.0 - 100.0 fL   MCH 23.3 (L) 26.0 - 34.0 pg   MCHC 31.6 30.0 - 36.0 g/dL   RDW 99.3 (H) 71.6 - 96.7 %   Platelets 495 (H) 150 - 400 K/uL   nRBC 0.0 0.0 - 0.2 %  Type and screen [facility]     Status: None   Collection Time: 10/21/20  1:57 PM  Result Value Ref Range   ABO/RH(D) A POS    Antibody Screen NEG    Sample Expiration      10/24/2020,2359 Performed at [facility], [address], [place], [place] [address]    US PELVIS (TRANSABDOMINAL ONLY)  Result Date: 10/21/2020 CLINICAL DATA:  Vaginal bleeding, C-section on 10/10/2020, increased volume in bleeding since starting blood thinners. Postpartum. EXAM: TRANSABDOMINAL ULTRASOUND OF PELVIS TECHNIQUE: Transabdominal ultrasound examination of the pelvis was performed including evaluation of the uterus, ovaries, adnexal regions, and pelvic cul-de-sac. COMPARISON:  Ultrasound Ob 08/02/2020 FINDINGS: Uterus Measurements: 13.5 x 7.6 x 9.8 cm = volume: 526 mL. No fibroids or other mass visualized. Endometrium Thickness: 50 mm. The endometrium is thickened, heterogeneous, and slightly vascular. Right ovary Not visualized. Left ovary Not visualized. Other findings:  No abnormal free fluid. IMPRESSION: 1.  Thickened, heterogeneous, and vascular endometrium. Findings suggestive of retained products of conception. 2. Unable to  complete exam, bilateral ovaries not visualized. Electronically Signed   By: Tish Frederickson M.D.   On: 10/21/2020 15:10     MAU Course  Procedures  MDM Check CBC (Type and Screen also drawn) Records reviewed - Hg 9.7 on discharge (1/11) and 10.2 in MAU 3 days ago.  14:30. I discussed with the patient that I would like to do a pelvic exam to evaluate her bleeding and to see if there is another source of her bleeding. Patient refused exam. She was also initially refusing an [place]. I explained that I needed to be able to evaluate her bleeding in some way and that the [place] would help to make sure there weren't any retained POC or other problems. Patient consented for [place], but still declined pelvic exam.  1530. Patient did not allow [place] to finish exam. Did get some images of uterus. I reviewed these images, which showing thickened endometrium. No increased blood flow with color doppler to suggest retained products of conception (this is a discrepancy to the radiologist's read). Endometrium smooth.  Due to incomplete images and to assess her wound, I expressed my desire to get a CT of her abdomen. Patient declined, got upset stating that we were trying to kill her, then walked down the hallway. I got her to come back to the room, discussed the results of her ultrasound with her. Discussed use of megace to try to stop her bleeding. The patient's mother asked why she was bleeding so much - I discussed that it was likely due to the blood thinners she was on. Her mom stated that she wasn't going to take those anymore. I stressed the importance of continuing the Eliquis because of the blood clots in her lungs, which is likely due to the combination of COVID, surgery, and postpartum state.   Additionally, blood counts stable. Suggesting that bleeding has not impacted Hg at this point.  Patient  and mother able to discuss disease process.   Assessment and Plan     ICD-10-CM   1. Postpartum hemorrhage, unspecified type  O72.1   2. Vaginal bleeding  N93.9 US PELVIS (TRANSABDOMINAL ONLY)    US PELVIS (TRANSABDOMINAL ONLY)    CANCELED: US PELVIC COMPLETE WITH TRANSVAGINAL    CANCELED: US PELVIC COMPLETE WITH TRANSVAGINAL  3. Cesarean delivery delivered  O82   4. Other acute pulmonary embolism without acute cor pulmonale (HCC)  I26.99    Patient insistent on leaving despite my recommendations of getting CT abd to evaluate incision. Start megace 40mg  BID Start iron supplement every other day. Continue eliquis.  Will have patient follow up in office next week. Return precautions given.  10/21/2020, 4:19 PM

## 2020-10-21 NOTE — MAU Note (Signed)
Presents with c/o VB and bleeding from incision site.  Reports VB had decreased but became heavier once started on blood thinners.  Reports was started on blood thinners 2-3 days ago.  S/P Cesarean 10/10/2020.

## 2020-11-03 ENCOUNTER — Telehealth: Payer: Self-pay

## 2020-11-03 NOTE — Telephone Encounter (Signed)
Return call to pt regarding vm  No answer busy signal Will sent pt a Mychart message.

## 2020-11-08 ENCOUNTER — Ambulatory Visit: Payer: Medicaid Other | Admitting: Obstetrics and Gynecology

## 2020-11-11 ENCOUNTER — Ambulatory Visit: Payer: Medicaid Other

## 2020-11-14 ENCOUNTER — Telehealth: Payer: Self-pay

## 2020-11-14 NOTE — Telephone Encounter (Signed)
TC from pt c/o "urinating through my vagina instead of my urethra."  Pt denies pain, denies abnormal VB. Pt scheduled tomorrow am for an examination.

## 2020-11-15 ENCOUNTER — Ambulatory Visit: Payer: Medicaid Other | Admitting: Obstetrics and Gynecology

## 2020-11-17 ENCOUNTER — Encounter: Payer: Self-pay | Admitting: Obstetrics

## 2020-11-17 ENCOUNTER — Other Ambulatory Visit: Payer: Self-pay

## 2020-11-17 ENCOUNTER — Ambulatory Visit: Payer: Medicaid Other | Admitting: Obstetrics and Gynecology

## 2020-11-17 ENCOUNTER — Ambulatory Visit (INDEPENDENT_AMBULATORY_CARE_PROVIDER_SITE_OTHER): Payer: Medicaid Other | Admitting: Obstetrics

## 2020-11-17 DIAGNOSIS — R300 Dysuria: Secondary | ICD-10-CM

## 2020-11-17 DIAGNOSIS — Z0000 Encounter for general adult medical examination without abnormal findings: Secondary | ICD-10-CM | POA: Diagnosis not present

## 2020-11-17 DIAGNOSIS — Z86711 Personal history of pulmonary embolism: Secondary | ICD-10-CM | POA: Diagnosis not present

## 2020-11-17 LAB — POCT URINALYSIS DIPSTICK
Bilirubin, UA: NEGATIVE
Glucose, UA: NEGATIVE
Ketones, UA: NEGATIVE
Leukocytes, UA: NEGATIVE
Nitrite, UA: NEGATIVE
Protein, UA: NEGATIVE
Spec Grav, UA: 1.02 (ref 1.010–1.025)
Urobilinogen, UA: 0.2 E.U./dL
pH, UA: 6 (ref 5.0–8.0)

## 2020-11-17 MED ORDER — VITAFOL ULTRA 29-0.6-0.4-200 MG PO CAPS: 1.0000 | ORAL_CAPSULE | Freq: Every day | ORAL | 4 refills | Status: DC

## 2020-11-17 NOTE — Progress Notes (Signed)
Pt states she is urinating through her vagina x 1 week. Pt states pain and pressure with urination.

## 2020-11-17 NOTE — Addendum Note (Signed)
Addended by: Marya Landry D on: 11/17/2020 01:00 PM   Modules accepted: Orders

## 2020-11-17 NOTE — Progress Notes (Signed)
Subjective:     Melinda Jimenez is a 35 y.o. female who presents for a postpartum visit. She is 5 weeks postpartum following a low cervical transverse Cesarean section. I have fully reviewed the prenatal and intrapartum course. The delivery was at 35 gestational weeks. Outcome: repeat cesarean section, low transverse incision. Anesthesia: spinal. Postpartum course has been complicated by postpartum.AUB.  Baby's course has been normal. Baby is feeding by bottle   Bleeding thin lochia. Bowel function is normal. Bladder function is abnormal: urinary frequency and bladder pressure. Patient is not sexually active. Contraception method is none. Postpartum depression screening: positive.  Tobacco, alcohol and substance abuse history reviewed.  Adult immunizations reviewed including TDAP, rubella and varicella.  The following portions of the patient's history were reviewed and updated as appropriate: allergies, current medications, past family history, past medical history, past social history, past surgical history and problem list.  Review of Systems A comprehensive review of systems was negative except for: Behavioral/Psych: positive for depression   Objective:    BP (!) 145/87   Pulse 87   Wt 198 lb (89.8 kg)   BMI 31.01 kg/m   General:  alert and no distress   Breasts:  inspection negative, no nipple discharge or bleeding, no masses or nodularity palpable  Lungs: clear to auscultation bilaterally  Heart:  regular rate and rhythm, S1, S2 normal, no murmur, click, rub or gallop  Abdomen: soft, non-tender; bowel sounds normal; no masses,  no organomegaly.  Incision is clean, dry and intact.   Vulva:  normal  Vagina: normal vagina  Cervix:  no lesions              Pelvic:  Patient refused bimanual exam   50% of 20 min visit spent on counseling and coordination of care.   Assessment:     1. Postpartum care following cesarean delivery Rx: - Prenat-Fe Poly-Methfol-FA-DHA (VITAFOL ULTRA)  29-0.6-0.4-200 MG CAPS; Take 1 capsule by mouth daily before breakfast.  Dispense: 90 capsule; Refill: 4  2. History of pulmonary embolus (PE) - on Eliquis  3. Dysuria - urine dip is significant for RBC's - urine culture sent  4. Routine adult health maintenance    Plan:    1. Contraception: abstinence 2. Continue PNV's 3. Follow up in: 6 weeks or as needed.    Brock Bad, MD 11/17/2020 11:35 AM

## 2020-11-19 LAB — URINE CULTURE

## 2020-12-29 ENCOUNTER — Ambulatory Visit: Payer: Medicaid Other | Admitting: Obstetrics

## 2021-01-03 ENCOUNTER — Telehealth: Payer: Self-pay

## 2021-01-03 NOTE — Telephone Encounter (Signed)
Unable to reach Pt to reschedule PP appt, phone rang then got busy signal.  

## 2021-06-28 ENCOUNTER — Ambulatory Visit: Payer: Medicaid Other

## 2021-07-17 ENCOUNTER — Ambulatory Visit: Payer: Medicaid Other

## 2021-07-18 ENCOUNTER — Ambulatory Visit (INDEPENDENT_AMBULATORY_CARE_PROVIDER_SITE_OTHER): Payer: Medicaid Other

## 2021-07-18 ENCOUNTER — Other Ambulatory Visit: Payer: Self-pay

## 2021-07-18 DIAGNOSIS — N912 Amenorrhea, unspecified: Secondary | ICD-10-CM | POA: Diagnosis not present

## 2021-07-18 DIAGNOSIS — Z3201 Encounter for pregnancy test, result positive: Secondary | ICD-10-CM

## 2021-07-18 DIAGNOSIS — Z3200 Encounter for pregnancy test, result unknown: Secondary | ICD-10-CM

## 2021-07-18 LAB — POCT URINE PREGNANCY: Preg Test, Ur: POSITIVE — AB

## 2021-07-18 NOTE — Progress Notes (Signed)
..  [name] presents today for UPT. She has no unusual complaints. LMP: 05-12-21    OBJECTIVE: Appears well, in no apparent distress.  OB History     Gravida  11   Para  5   Term  3   Preterm  2   AB  5   Living  4      SAB  2   IAB  3   Ectopic      Multiple  0   Live Births  5          Home UPT Result:Positive In-Office UPT result:Positive I have reviewed the patient's medical, obstetrical, social, and family histories, and medications.   ASSESSMENT: Positive pregnancy test. Pt states that this was unplanned and she is unsure if she will continue with pregnancy.  PLAN Prenatal care to be completed at: [facility]

## 2021-07-21 ENCOUNTER — Inpatient Hospital Stay (HOSPITAL_COMMUNITY)
Admission: AD | Admit: 2021-07-21 | Discharge: 2021-07-21 | Disposition: A | Discharge status: Home / Self Care | Payer: Medicaid Other | Attending: Obstetrics and Gynecology | Admitting: Obstetrics and Gynecology

## 2021-07-21 ENCOUNTER — Other Ambulatory Visit: Payer: Self-pay

## 2021-07-21 ENCOUNTER — Inpatient Hospital Stay (HOSPITAL_COMMUNITY): Payer: Medicaid Other

## 2021-07-21 ENCOUNTER — Encounter (HOSPITAL_COMMUNITY): Payer: Self-pay | Admitting: Obstetrics and Gynecology

## 2021-07-21 DIAGNOSIS — O219 Vomiting of pregnancy, unspecified: Secondary | ICD-10-CM | POA: Insufficient documentation

## 2021-07-21 DIAGNOSIS — O99891 Other specified diseases and conditions complicating pregnancy: Secondary | ICD-10-CM | POA: Diagnosis not present

## 2021-07-21 DIAGNOSIS — O209 Hemorrhage in early pregnancy, unspecified: Secondary | ICD-10-CM | POA: Diagnosis present

## 2021-07-21 DIAGNOSIS — H60392 Other infective otitis externa, left ear: Secondary | ICD-10-CM

## 2021-07-21 DIAGNOSIS — O10911 Unspecified pre-existing hypertension complicating pregnancy, first trimester: Secondary | ICD-10-CM | POA: Insufficient documentation

## 2021-07-21 DIAGNOSIS — Z91040 Latex allergy status: Secondary | ICD-10-CM | POA: Insufficient documentation

## 2021-07-21 DIAGNOSIS — Z7722 Contact with and (suspected) exposure to environmental tobacco smoke (acute) (chronic): Secondary | ICD-10-CM | POA: Diagnosis not present

## 2021-07-21 DIAGNOSIS — R112 Nausea with vomiting, unspecified: Secondary | ICD-10-CM

## 2021-07-21 DIAGNOSIS — Z98891 History of uterine scar from previous surgery: Secondary | ICD-10-CM | POA: Insufficient documentation

## 2021-07-21 DIAGNOSIS — Z8616 Personal history of COVID-19: Secondary | ICD-10-CM | POA: Diagnosis not present

## 2021-07-21 DIAGNOSIS — Z8249 Family history of ischemic heart disease and other diseases of the circulatory system: Secondary | ICD-10-CM | POA: Insufficient documentation

## 2021-07-21 DIAGNOSIS — O021 Missed abortion: Secondary | ICD-10-CM | POA: Insufficient documentation

## 2021-07-21 DIAGNOSIS — Z8619 Personal history of other infectious and parasitic diseases: Secondary | ICD-10-CM | POA: Diagnosis not present

## 2021-07-21 DIAGNOSIS — Z3A01 Less than 8 weeks gestation of pregnancy: Secondary | ICD-10-CM | POA: Diagnosis not present

## 2021-07-21 DIAGNOSIS — Z833 Family history of diabetes mellitus: Secondary | ICD-10-CM | POA: Insufficient documentation

## 2021-07-21 DIAGNOSIS — O24410 Gestational diabetes mellitus in pregnancy, diet controlled: Secondary | ICD-10-CM | POA: Diagnosis not present

## 2021-07-21 LAB — WET PREP, GENITAL
Sperm: NONE SEEN
Trich, Wet Prep: NONE SEEN
Yeast Wet Prep HPF POC: NONE SEEN

## 2021-07-21 LAB — URINALYSIS, ROUTINE W REFLEX MICROSCOPIC
Bacteria, UA: NONE SEEN
Bilirubin Urine: NEGATIVE
Glucose, UA: NEGATIVE mg/dL
Hgb urine dipstick: NEGATIVE
Ketones, ur: NEGATIVE mg/dL
Leukocytes,Ua: NEGATIVE
Nitrite: NEGATIVE
Protein, ur: 30 mg/dL — AB
Specific Gravity, Urine: 1.02 (ref 1.005–1.030)
pH: 6 (ref 5.0–8.0)

## 2021-07-21 LAB — CBC
HCT: 36.5 % (ref 36.0–46.0)
Hemoglobin: 11 g/dL — ABNORMAL LOW (ref 12.0–15.0)
MCH: 22.4 pg — ABNORMAL LOW (ref 26.0–34.0)
MCHC: 30.1 g/dL (ref 30.0–36.0)
MCV: 74.3 fL — ABNORMAL LOW (ref 80.0–100.0)
Platelets: 357 10*3/uL (ref 150–400)
RBC: 4.91 MIL/uL (ref 3.87–5.11)
RDW: 17.7 % — ABNORMAL HIGH (ref 11.5–15.5)
WBC: 10.5 10*3/uL (ref 4.0–10.5)
nRBC: 0 % (ref 0.0–0.2)

## 2021-07-21 LAB — HCG, QUANTITATIVE, PREGNANCY: hCG, Beta Chain, Quant, S: 40921 m[IU]/mL — ABNORMAL HIGH (ref <5)

## 2021-07-21 MED ORDER — MISOPROSTOL 200 MCG PO TABS: 800.0000 ug | ORAL_TABLET | Freq: Every day | ORAL | 0 refills | Status: DC | PRN

## 2021-07-21 MED ORDER — CIPROFLOXACIN-DEXAMETHASONE 0.3-0.1 % OT SUSP: 4.0000 [drp] | Freq: Two times a day (BID) | OTIC | 0 refills | Status: AC

## 2021-07-21 MED ORDER — METOCLOPRAMIDE HCL 10 MG PO TABS: 10.0000 mg | ORAL_TABLET | Freq: Three times a day (TID) | ORAL | 0 refills | Status: DC | PRN

## 2021-07-21 MED ORDER — METOCLOPRAMIDE HCL 10 MG PO TABS
10.0000 mg | ORAL_TABLET | Freq: Once | ORAL | Status: AC
  Administered 2021-07-21: 10 mg via ORAL
  Filled 2021-07-21: qty 1

## 2021-07-21 MED ORDER — OXYCODONE HCL 5 MG PO TABS: 5.0000 mg | ORAL_TABLET | Freq: Four times a day (QID) | ORAL | 0 refills | Status: AC | PRN

## 2021-07-21 NOTE — MAU Note (Signed)
Melinda Jimenez is a 35 y.o. at [redacted]w[redacted]d here in MAU reporting: started having vaginal bleeding vomiting last night. 3 episodes of vomiting. States bleeding was in her panties and then when she wiped. Has a headache and feels like she may have an ear infection.  LMP: 05/12/21  Onset of complaint: last night  Pain score: 10/10  Vitals:   07/21/21 0912  BP: 140/83  Pulse: 100  Resp: 16  Temp: 98 F (36.7 C)  SpO2: 99%     Lab orders placed from triage: UA

## 2021-07-21 NOTE — Discharge Instructions (Signed)
Cytotec for Pregnancy Failure FACTS YOU SHOULD KNOW  WHAT IS AN EARLY PREGNANCY FAILURE? Once the egg is fertilized with the sperm and begins to develop, it attaches to the lining of the uterus. This early pregnancy tissue may not develop into an embryo (the beginning stage of a baby). Sometimes an embryo does develop but does not continue to grow. These problems can be seen on ultrasound.   MANAGEMNT OF EARLY PREGNANCY FAILURE: About 4 out of 100 (0.25%) women will have a pregnancy loss in her lifetime.  One in five pregnancies is found to be an early pregnancy failure.  There are 3 ways to care for an early pregnancy failure:   Surgery, (2) Medicine, (3) Waiting for you to pass the pregnancy on your own. The decision as to how to proceed after being diagnosed with and early pregnancy failure is an individual one.  The decision can be made only after appropriate counseling.  You need to weigh the pros and cons of the 3 choices. Then you can make the choice that works for you. SURGERY (D&E) Procedure over in 1 day Requires being put to sleep Bleeding may be light Possible problems during surgery, including injury to womb(uterus) Care provider has more control Medicine (CYTOTEC) The complete procedure may take days to weeks No Surgery Bleeding may be heavy at times There may be drug side effects Patient has more control Waiting You may choose to wait, in which case your own body may complete the passing of the abnormal early pregnancy on its own in about 2-4 weeks Your bleeding may be heavy at times There is a small possibility that you may need surgery if the bleeding is too much or not all of the pregnancy has passed. CYTOTEC MANAGEMENT Prostaglandins (cytotec) are the most widely used drug for this purpose. They cause the uterus to cramp and contract. You will place the medicine yourself inside your vagina in the privacy of your home. Empting of the uterus should occur within 3 days but  the process may continue for several weeks. The bleeding may seem heavy at times. POSSIBLE SIDE EFFECTS FROM CYTOTEC Nausea   Vomiting Diarrhea Fever Chills  Hot Flashes Side effects  from the process of the early pregnancy failure include: Cramping  Bleeding Headaches  Dizziness RISKS: This is a low risk procedure. Less than 1 in 100 women has a complication. An incomplete passage of the early pregnancy may occur. Also, Hemorrhage (heavy bleeding) could happen.  Rarely the pregnancy will not be passed completely. Excessively heavy bleeding may occur.  Your doctor may need to perform surgery to empty the uterus (D&E). Afterwards: Everybody will feel differently after the early pregnancy completion. You may have soreness or cramps for a day or two. You may have soreness or cramps for day or two.  You may have light bleeding for up to 2 weeks. You may be as active as you feel like being. If you have any of the following problems you may call Maternity Admissions Unit at [phone]. If you have pain that does not get better  with pain medication Bleeding that soaks through 2 thick full-sized sanitary pads in an hour Cramps that last longer than 2 days Foul smelling discharge Fever above 100.4 degrees F Even if you do not have any of these symptoms, you should have a follow-up exam to make sure you are healing properly. This appointment will be made for you before you leave the hospital. Your next normal period will  start again in 4-6 week after the loss. You can get pregnant soon after the loss, so use birth control right away. Finally: Make sure all your questions are answered before during and after any procedure. Follow up with medical care and family planning methods.     

## 2021-07-21 NOTE — MAU Provider Note (Signed)
History     CSN: [id number]  Arrival date and time: 07/21/21 5009   Event Date/Time   First Provider Initiated Contact with Patient 07/21/21 [phone]      Chief Complaint  Patient presents with   Headache   Vaginal Bleeding   Emesis   HPI Melinda Jimenez is a 35 y.o. [id number] at [redacted]w[redacted]d who presents with n/v, ear pain, & vaginal bleeding.  Has had nausea with the pregnancy. Vomited 3 times this morning. Does not have antiemetic at home.  Left ear pain started yesterday. Rates pain 10/10. Touching her ear makes pain worse. No discharge or bleeding from ear. Hasn't treated symptoms.  Pink/red vaginal spotting. Saw a streak in her underwear this morning but since then has just been on toilet paper. Not passing clots. No abdominal pain. No recent intercourse. No abnormal vaginal discharge.   OB History     Gravida  11   Para  5   Term  3   Preterm  2   AB  5   Living  4      SAB  2   IAB  3   Ectopic      Multiple  0   Live Births  5           Past Medical History:  Diagnosis Date   Anemia    Anxiety    off meds with preg   Carpal tunnel syndrome    Chlamydia    Chronic hypertension    COVID-19 09/12/2020   Genital HSV    Last OB: > 61yr ago; Valtrex 500 mg po prn outbreaks   GERD (gastroesophageal reflux disease)    Gestational diabetes    diet controlled   Headache(784.0)    IBS (irritable bowel syndrome)    Infection    UTI   Mood swings    Panic attack    Thyroid disease    Trichomonas     Past Surgical History:  Procedure Laterality Date   CESAREAN SECTION N/A 05/20/2013   Procedure: CESAREAN SECTION;  Surgeon: Brock Bad, MD;  Location: [facility];  Service: Obstetrics;  Laterality: N/A;   CESAREAN SECTION N/A 04/06/2017   Procedure: CESAREAN SECTION;  Surgeon: Hermina Staggers, MD;  Location: [facility] SU[facility];  Service: Obstetrics;  Laterality: N/A;   CESAREAN SECTION  10/10/2020   Procedure: CESAREAN SECTION;  Surgeon: Warden Fillers,  MD;  Location: [facility] ORS;  Service: Obstetrics;;   INDUCED ABORTION  2009   VAGINAL DELIVERY  2007    Family History  Problem Relation Age of Onset   Depression Mother    Anxiety disorder Mother    Breast cancer Other    Bipolar disorder Brother    Diabetes Maternal Grandmother    Cancer Maternal Grandmother    Hypertension Maternal Grandmother    Hypertension Paternal Grandmother    Autism Other    Schizophrenia Other     Social History   Tobacco Use   Smoking status: Passive Smoke Exposure - Never Smoker   Smokeless tobacco: Never   Tobacco comments:    was not a daily smoker, tried it  Vaping Use   Vaping Use: Never used  Substance Use Topics   Alcohol use: No    Comment: Twice a month   Drug use: No    Allergies:  Allergies  Allergen Reactions   Latex Itching    No medications prior to admission.    Review of Systems  Constitutional: Negative.   HENT:  Positive for ear pain. Negative for congestion, dental problem, ear discharge, facial swelling and hearing loss.   Gastrointestinal:  Positive for nausea and vomiting. Negative for abdominal pain, constipation and diarrhea.  Genitourinary:  Positive for vaginal bleeding. Negative for dysuria and vaginal discharge.  Neurological:  Positive for headaches.  Physical Exam   Blood pressure (!) 144/84, pulse (!) 102, temperature 98 F (36.7 C), temperature source Oral, resp. rate 16, height 5\' 7"  (1.702 m), weight 111.1 kg, last menstrual period 05/12/2021, SpO2 99 %, unknown if currently breastfeeding.  Physical Exam Vitals and nursing note reviewed. Exam conducted with a chaperone present.  Constitutional:      Appearance: She is well-developed. She is not ill-appearing or toxic-appearing.  HENT:     Head: Normocephalic and atraumatic.     Right Ear: Tympanic membrane and ear canal normal.     Left Ear: Tympanic membrane normal. Tenderness present. There is no impacted cerumen. There is mastoid tenderness.  Tympanic membrane is not injected, perforated or bulging.     Ears:     Comments: Tragus tender. Left external canal is erythematous.  Eyes:     General: No scleral icterus. Pulmonary:     Effort: Pulmonary effort is normal. No respiratory distress.  Abdominal:     General: There is no distension.     Palpations: Abdomen is soft.     Tenderness: There is no abdominal tenderness.  Genitourinary:    Exam position: Lithotomy position.     Vagina: Erythema present. No tenderness or bleeding.     Cervix: No discharge, friability or cervical bleeding.  Skin:    General: Skin is warm and dry.  Neurological:     Mental Status: She is alert.     Sensory: No sensory deficit.  Psychiatric:        Mood and Affect: Mood normal.        Behavior: Behavior normal.    MAU Course  Procedures Results for orders placed or performed during the hospital encounter of 07/21/21 (from the past 24 hour(s))  Wet prep, genital     Status: Abnormal   Collection Time: 07/21/21  9:34 AM   Specimen: PATH Cytology Cervicovaginal Ancillary Only  Result Value Ref Range   Yeast Wet Prep HPF POC NONE SEEN NONE SEEN   Trich, Wet Prep NONE SEEN NONE SEEN   Clue Cells Wet Prep HPF POC PRESENT (A) NONE SEEN   WBC, Wet Prep HPF POC MANY (A) NONE SEEN   Sperm NONE SEEN   Urinalysis, Routine w reflex microscopic Urine, Clean Catch     Status: Abnormal   Collection Time: 07/21/21  9:36 AM  Result Value Ref Range   Color, Urine YELLOW YELLOW   APPearance CLEAR CLEAR   Specific Gravity, Urine 1.020 1.005 - 1.030   pH 6.0 5.0 - 8.0   Glucose, UA NEGATIVE NEGATIVE mg/dL   Hgb urine dipstick NEGATIVE NEGATIVE   Bilirubin Urine NEGATIVE NEGATIVE   Ketones, ur NEGATIVE NEGATIVE mg/dL   Protein, ur 30 (A) NEGATIVE mg/dL   Nitrite NEGATIVE NEGATIVE   Leukocytes,Ua NEGATIVE NEGATIVE   WBC, UA 0-5 0 - 5 WBC/hpf   Bacteria, UA NONE SEEN NONE SEEN   Squamous Epithelial / LPF 0-5 0 - 5   Mucus PRESENT   CBC     Status:  Abnormal   Collection Time: 07/21/21  9:45 AM  Result Value Ref Range   WBC 10.5 4.0 - 10.5  K/uL   RBC 4.91 3.87 - 5.11 MIL/uL   Hemoglobin 11.0 (L) 12.0 - 15.0 g/dL   HCT 85.6 31.4 - 97.0 %   MCV 74.3 (L) 80.0 - 100.0 fL   MCH 22.4 (L) 26.0 - 34.0 pg   MCHC 30.1 30.0 - 36.0 g/dL   RDW 26.3 (H) 78.5 - 88.5 %   Platelets 357 150 - 400 K/uL   nRBC 0.0 0.0 - 0.2 %  hCG, quantitative, pregnancy     Status: Abnormal   Collection Time: 07/21/21  9:45 AM  Result Value Ref Range   hCG, Beta Chain, Quant, S 40,921 (H) <5 mIU/mL   US OB LESS THAN 14 WEEKS WITH OB TRANSVAGINAL  Result Date: 07/21/2021 CLINICAL DATA:  Vaginal bleeding in early pregnancy. EXAM: OBSTETRIC <14 WK [place] AND TRANSVAGINAL OB US TECHNIQUE: Both transabdominal and transvaginal ultrasound examinations were performed for complete evaluation of the gestation as well as the maternal uterus, adnexal regions, and pelvic cul-de-sac. Transvaginal technique was performed to assess early pregnancy. COMPARISON:  Pelvic ultrasound 10/21/2020 FINDINGS: Intrauterine gestational sac: Single Yolk sac:  Visualized. Embryo:  Visualized. Cardiac Activity: Not Visualized. CRL:  13.3 mm   7 w   4 d                  [place] EDC: 03/05/2022 Subchorionic hemorrhage:  None visualized. Maternal uterus/adnexae: Unremarkable left ovary. Right ovary not well seen with the patient unable to tolerate the complete transvaginal examination. No free fluid. IMPRESSION: Findings meet definitive criteria for failed pregnancy. This follows SRU consensus guidelines: Diagnostic Criteria for Nonviable Pregnancy Early in the First Trimester. [facility] [phone]. Electronically Signed   By: Sebastian Ache M.D.   On: 07/21/2021 10:59    MDM Ear pain - tender tragus & erythematous canal. TM looks normal. Will rx ciprodex for otitis externa. Encouraged patient to f/u with PCP or urgent care if symptoms not improved withn 48 hrs of using abx  N/v. Patient not actively  vomiting in MAU & u/a is normal. Given oral reglan in MAU. Patient would like rx to have at home.   Vaginal bleeding. Patient is RH positive. Ultrasound today shows [redacted]w[redacted]d IUP with no cardiac activity. Reviewed results with patient. Discussed options for management of incomplete AB including expectant management, Cytotec or D&C. Prefers medical managment. Verbalizes understanding that intervention may become necessary if SAB in not completed spontaneously or if heavy bleeding or infection occur.    Assessment and Plan   1. Missed abortion with fetal demise before 20 completed weeks of gestation  -Rx cytotec & oxycodone #12 -Discussed expectations after using cytotec & given enough to repeat dosing in 24 hours if no response. Reviewed bleeding precautions & reasons to return to MAU. Message sent to office to reschedule appointment at 2 week SAB f/u.  -Pelvic rest  2. Vaginal bleeding in pregnancy, first trimester   3. Nausea and vomiting during pregnancy prior to [redacted] weeks gestation  -Rx reglan  4. Other infective acute otitis externa of left ear  -Rx ciprodex -f/u with urgent care or PCP if symptoms don't improve  5. [redacted] weeks gestation of pregnancy      Judeth Horn 07/21/2021, 3:28 PM

## 2021-07-24 LAB — GC/CHLAMYDIA PROBE AMP (~~LOC~~) NOT AT ARMC
Chlamydia: POSITIVE — AB
Comment: NEGATIVE
Comment: NORMAL
Neisseria Gonorrhea: NEGATIVE

## 2021-07-25 ENCOUNTER — Other Ambulatory Visit: Payer: Self-pay

## 2021-07-25 ENCOUNTER — Telehealth: Payer: Self-pay

## 2021-07-25 DIAGNOSIS — A749 Chlamydial infection, unspecified: Secondary | ICD-10-CM

## 2021-07-25 MED ORDER — DOXYCYCLINE HYCLATE 100 MG PO CAPS: 100.0000 mg | ORAL_CAPSULE | Freq: Two times a day (BID) | ORAL | 0 refills | Status: DC

## 2021-07-25 MED ORDER — AZITHROMYCIN 500 MG PO TABS: 1000.0000 mg | ORAL_TABLET | Freq: Once | ORAL | 0 refills | Status: DC

## 2021-07-25 NOTE — Telephone Encounter (Signed)
Pt called stating that she saw her +Chlamydia results on her MyChart and wanted to know if she could be treated. Advised patient yes, would go ahead and send treatment to her pharmacy. Also advised patient she would need to let her partner know and that once both have been treated they would need to abstain from intercourse for 7-10 days. Pt agreed and verbalized understanding.
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
## 2021-08-07 ENCOUNTER — Ambulatory Visit: Payer: Medicaid Other | Admitting: Obstetrics and Gynecology

## 2021-08-10 ENCOUNTER — Encounter: Payer: Medicaid Other | Admitting: Obstetrics and Gynecology

## 2021-10-23 ENCOUNTER — Ambulatory Visit (INDEPENDENT_AMBULATORY_CARE_PROVIDER_SITE_OTHER): Payer: Medicaid Other

## 2021-10-23 ENCOUNTER — Other Ambulatory Visit: Payer: Self-pay

## 2021-10-23 ENCOUNTER — Other Ambulatory Visit (HOSPITAL_COMMUNITY)
Admission: RE | Admit: 2021-10-23 | Discharge: 2021-10-23 | Disposition: A | Discharge status: Home / Self Care | Payer: Medicaid Other | Source: Ambulatory Visit | Attending: Obstetrics and Gynecology | Admitting: Obstetrics and Gynecology

## 2021-10-23 DIAGNOSIS — R300 Dysuria: Secondary | ICD-10-CM | POA: Diagnosis not present

## 2021-10-23 DIAGNOSIS — Z202 Contact with and (suspected) exposure to infections with a predominantly sexual mode of transmission: Secondary | ICD-10-CM | POA: Diagnosis present

## 2021-10-23 LAB — POCT URINALYSIS DIPSTICK
Bilirubin, UA: NEGATIVE
Blood, UA: NEGATIVE
Glucose, UA: NEGATIVE
Ketones, UA: NEGATIVE
Leukocytes, UA: NEGATIVE
Nitrite, UA: NEGATIVE
Protein, UA: NEGATIVE
Spec Grav, UA: 1.015 (ref 1.010–1.025)
Urobilinogen, UA: 0.2 E.U./dL
pH, UA: 6.5 (ref 5.0–8.0)

## 2021-10-23 NOTE — Progress Notes (Signed)
SUBJECTIVE:  36 y.o. female complains of vaginal burning, with creamy, white vaginal discharge, and lower abdominal pain for 2 week(s). She states that she did have a episode of spotting but states that she does have some pelvic pain. Denies fever. She also complains of having pressure with urination. She states that FOB was notified by another partner that she tested positive for Chlamydia and Trich. FOB denies having any positive results or taking any medication for STD treatment.    OBJECTIVE:  She appears well, afebrile. Urine dipstick: not done.  ASSESSMENT:  Vaginal Discharge  Vaginal Odor   PLAN:  GC, chlamydia, trichomonas, BVAG, CVAG probe sent to lab. Treatment: To be determined once lab results are received ROV prn if symptoms persist or worsen.

## 2021-10-23 NOTE — Progress Notes (Signed)
Patient was assessed and managed by nursing staff during this encounter. I have reviewed the chart and agree with the documentation and plan. I have also made any necessary editorial changes. ° °Momodou Consiglio, MD °10/23/2021 12:54 PM  ° °

## 2021-10-24 LAB — CERVICOVAGINAL ANCILLARY ONLY
Bacterial Vaginitis (gardnerella): POSITIVE — AB
Candida Glabrata: NEGATIVE
Candida Vaginitis: POSITIVE — AB
Chlamydia: NEGATIVE
Comment: NEGATIVE
Comment: NEGATIVE
Comment: NEGATIVE
Comment: NEGATIVE
Comment: NEGATIVE
Comment: NORMAL
Neisseria Gonorrhea: NEGATIVE
Trichomonas: NEGATIVE

## 2021-10-24 MED ORDER — FLUCONAZOLE 150 MG PO TABS: 150.0000 mg | ORAL_TABLET | Freq: Once | ORAL | 0 refills | Status: AC

## 2021-10-24 MED ORDER — METRONIDAZOLE 500 MG PO TABS: 500.0000 mg | ORAL_TABLET | Freq: Two times a day (BID) | ORAL | 0 refills | Status: DC

## 2021-10-24 NOTE — Addendum Note (Signed)
Addended by: Mora Bellman on: 10/24/2021 05:33 PM   Modules accepted: Orders

## 2021-12-15 IMAGING — MR MR HEAD W/O CM
6 of 10 series · 27 of 48 positions shown · non-contrast
Comparison: CT head 01/01/2020

CLINICAL DATA: Chronic headache

EXAM:
MRI HEAD WITHOUT CONTRAST
TECHNIQUE: Multiplanar, multiecho pulse sequences of the brain and surrounding
structures were obtained without intravenous contrast.

[Series 3: DWI · axial · 3.0mm · 0.94mm/px · z∈[-97,+47]mm · 8 of 100 slices shown (1 of 2)]
[im 1/100]
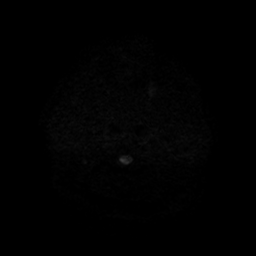
[im 12/100]
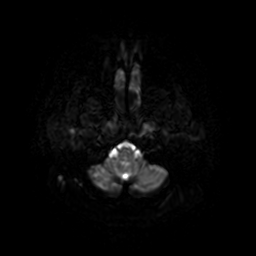
[im 34/100]
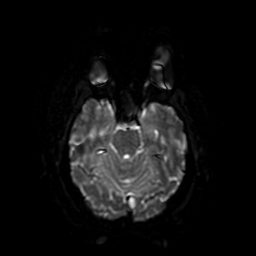
[im 45/100]
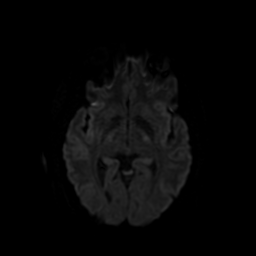
[im 56/100]
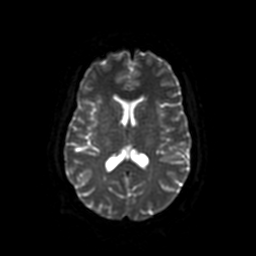
[im 67/100]
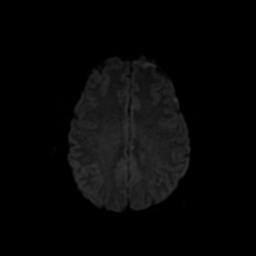
[im 89/100]
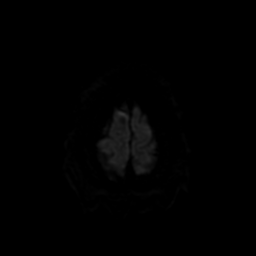
[im 100/100]
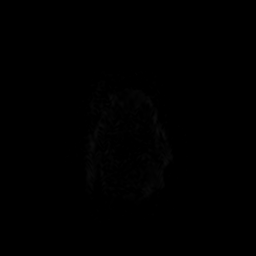

[Series 4: DWI · coronal · 4.0mm · 0.94mm/px · 7 of 74 slices shown (2 of 2)]
[im 1/74]
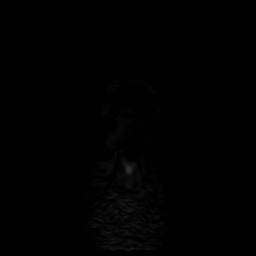
[im 13/74]
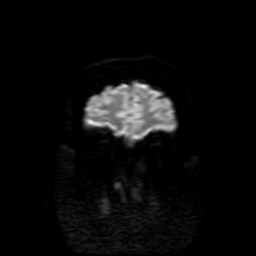
[im 25/74]
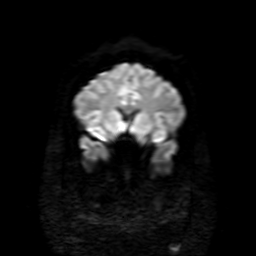
[im 37/74]
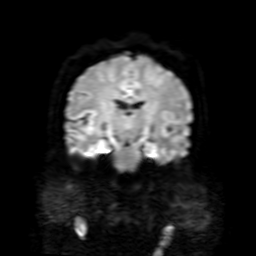
[im 49/74]
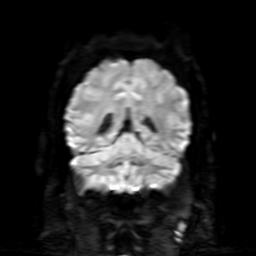
[im 61/74]
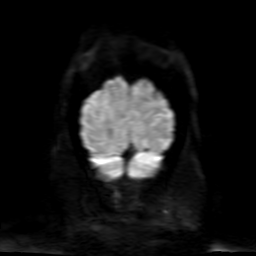
[im 74/74]
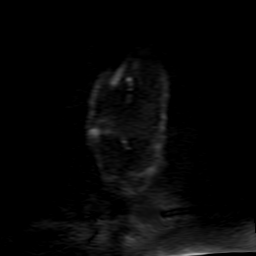

[Series 7: FLAIR · axial · 3.0mm · 0.47mm/px · z∈[-102,+44]mm · 2 of 26 slices shown (1 of 2)]
[im 1/26]
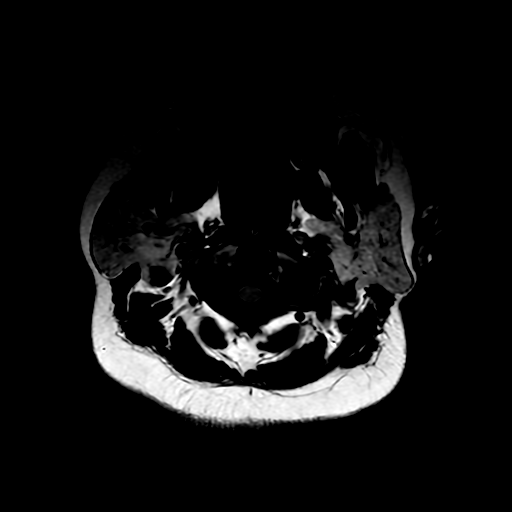
[im 26/26]
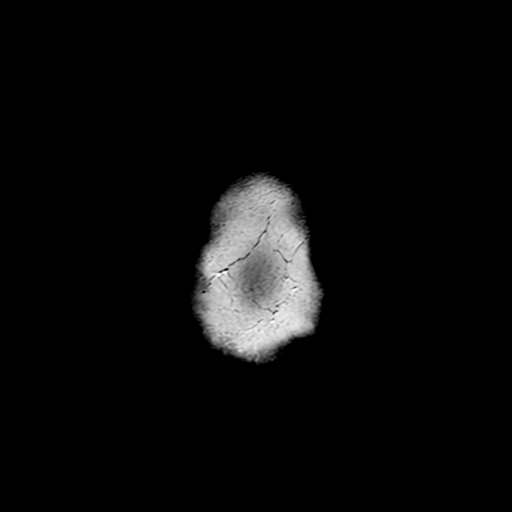

[Series 9: FLAIR · sagittal · 5.0mm · 0.23mm/px · 2 of 23 slices shown (2 of 2)]
[im 1/23]
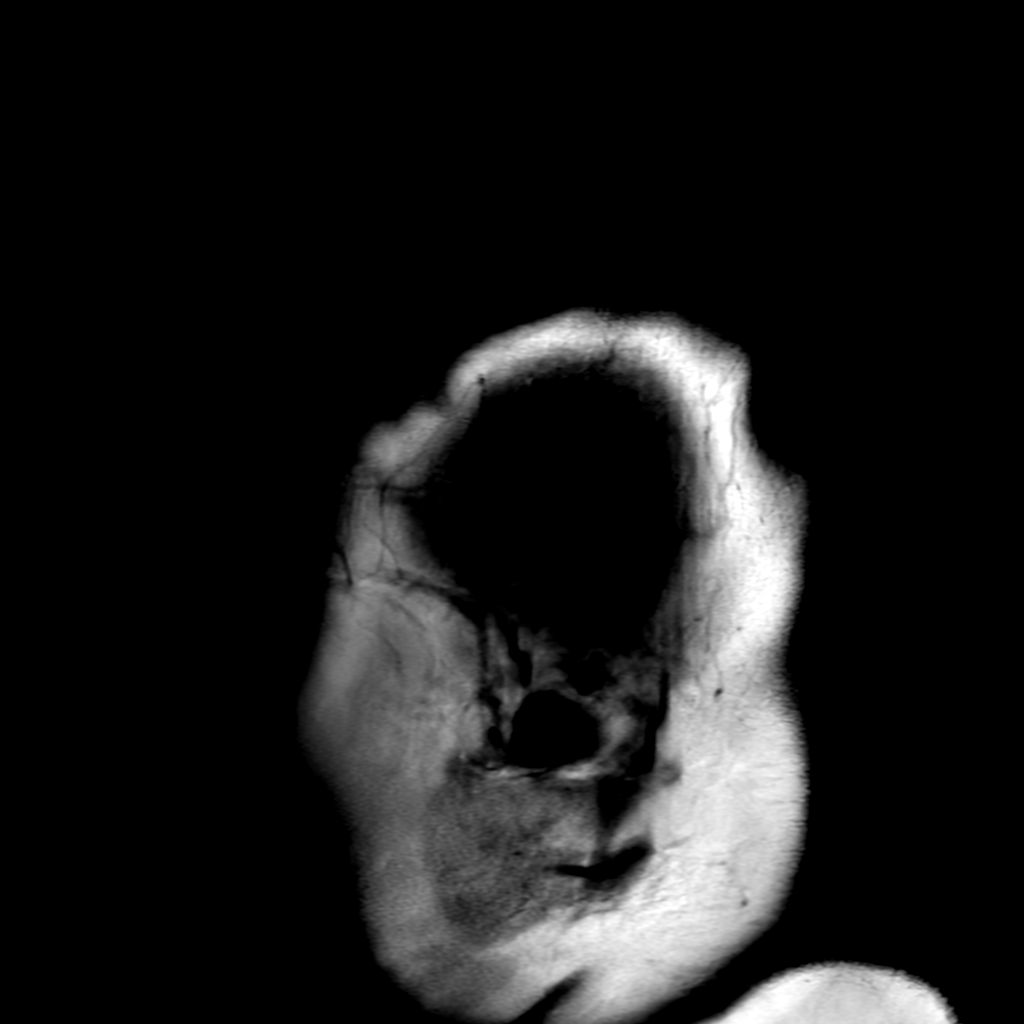
[im 23/23]
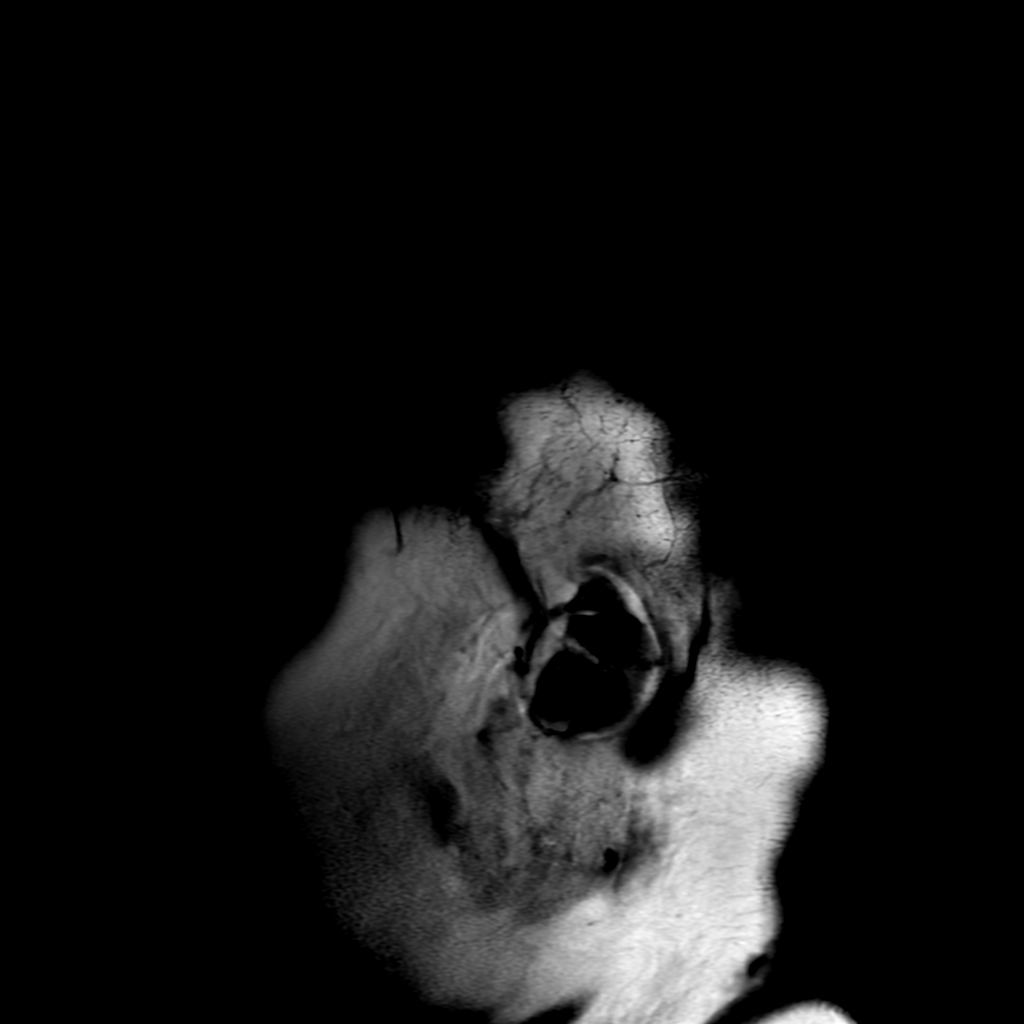

[Series 350: ADC · axial · 3.0mm · 0.94mm/px · z∈[-97,+47]mm · 5 of 50 slices shown (1 of 2)]
[im 1/50]
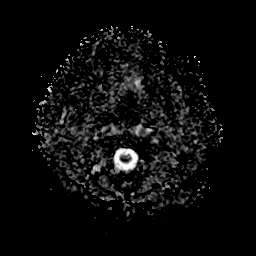
[im 13/50]
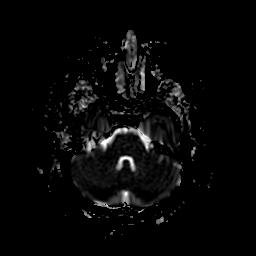
[im 25/50]
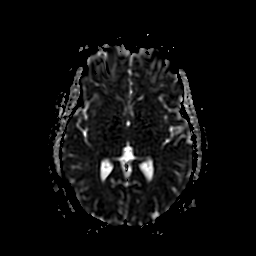
[im 37/50]
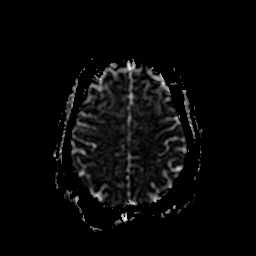
[im 50/50]
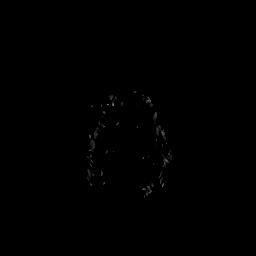

[Series 450: ADC · coronal · 4.0mm · 0.94mm/px · 3 of 37 slices shown (2 of 2)]
[im 1/37]
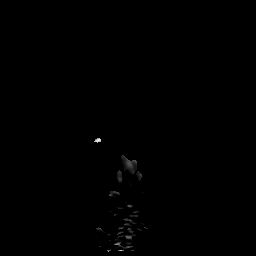
[im 19/37]
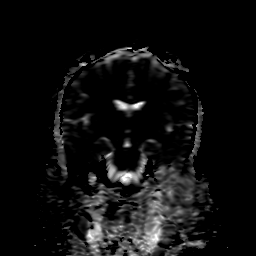
[im 37/37]
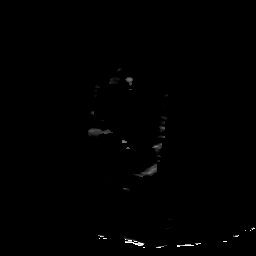

[27 of 48 positions shown; findings below may reference images not displayed]

FINDINGS: Brain: Image quality degraded by motion

Ventricle size normal. Cerebral volume normal. Negative for acute
infarct, hemorrhage, mass. Few small hyperintensities in the left
frontal white matter. Brainstem normal.

Vascular: Normal arterial flow voids

Skull and upper cervical spine: No focal skeletal lesion.

Sinuses/Orbits: Negative

Other: None
IMPRESSION: No acute abnormality. Few small deep white matter hyperintensity in
the left frontal white matter may be related to complex migraine
headaches or chronic ischemia.

## 2021-12-27 IMAGING — DX DG CHEST 2V
2 series · 2 of 2 positions shown · non-contrast
Comparison: 02/10/2020

CLINICAL DATA: Chest pain

EXAM:
CHEST - 2 VIEW

[chest pa]
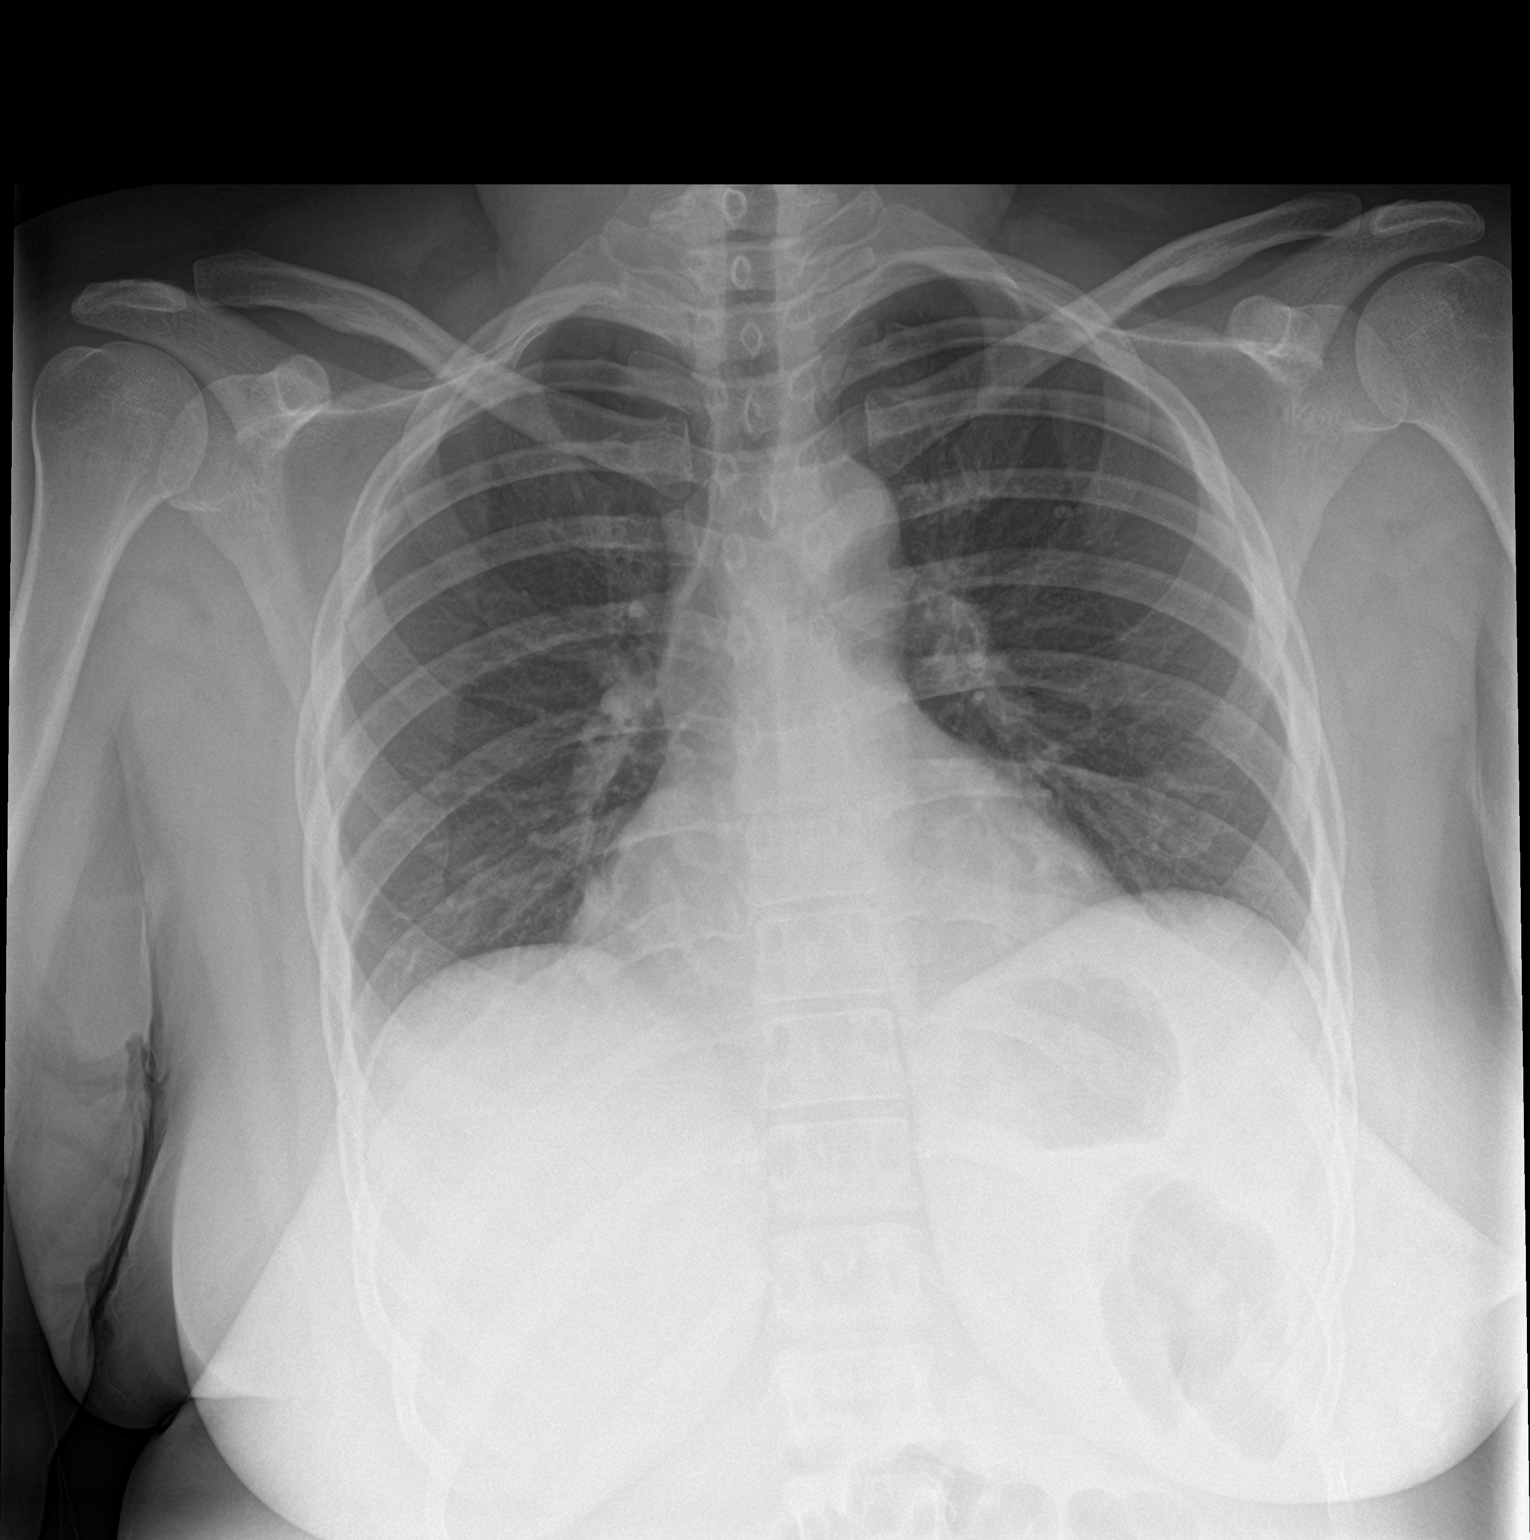

[chest lat]
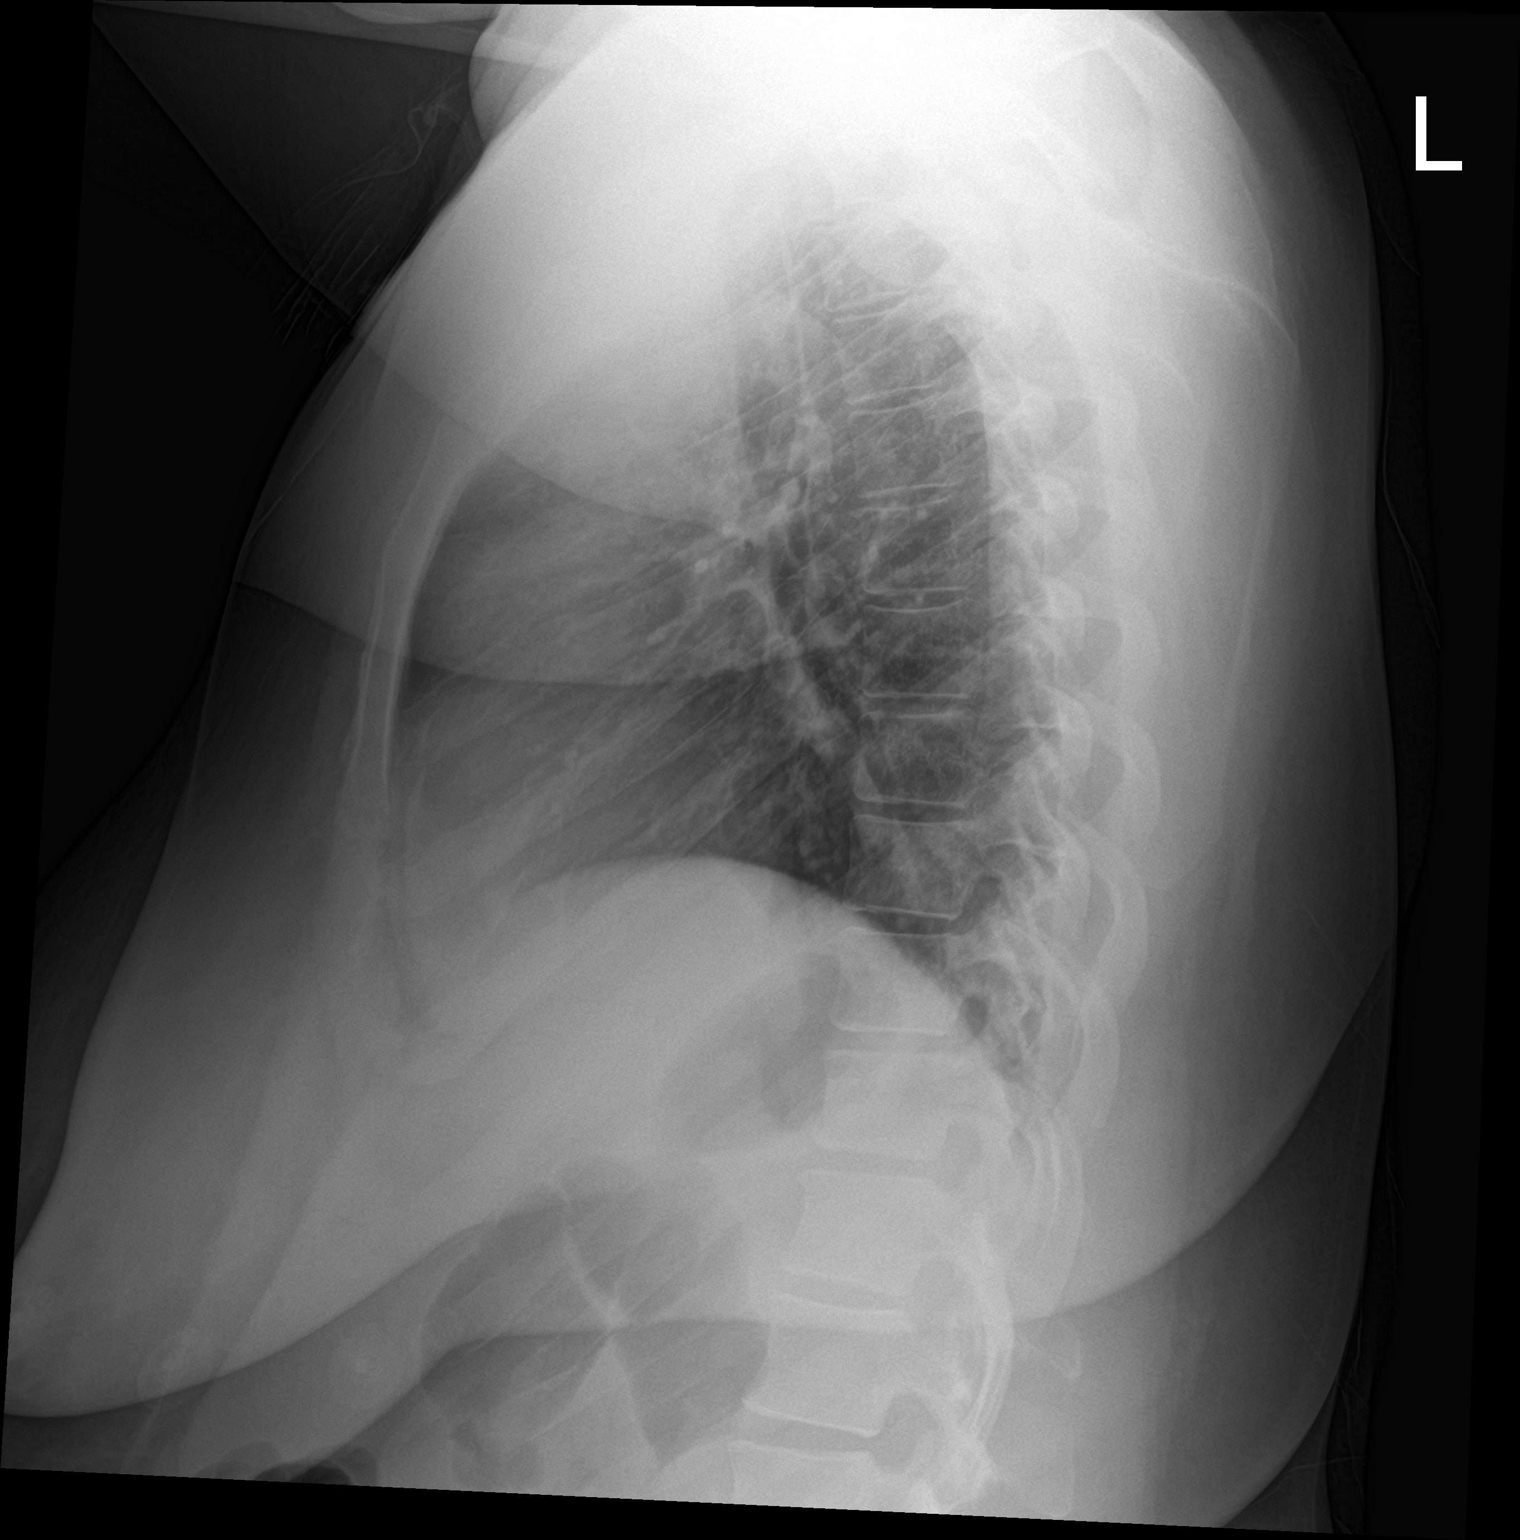

[2 of 2 positions shown; findings below may reference images not displayed]

FINDINGS: The heart size and mediastinal contours are within normal limits.
Both lungs are clear. The visualized skeletal structures are
unremarkable.
IMPRESSION: Normal study.

## 2022-02-05 NOTE — Progress Notes (Deleted)
Subjective:    Patient ID: Metta Clines, female    DOB: 1986/05/22, 35 y.o.   MRN: [id number]   CC: Establish care  HPI:  VIOLETTA DIMON is a very pleasant 36 y.o. female who presents today to establish care.  Initial concerns:***  Past medical history: PE, genital herpes, GERD, anxiety, brief psychotic episode   Past surgical history:***  Current medications:***  Family history:***  Social history:***  ROS: pertinent noted in the HPI   Objective:  There were no vitals taken for this visit. ***  Vitals and nursing note reviewed  General: NAD, pleasant, able to participate in exam Cardiac: RRR, S1 S2 present. normal heart sounds, no murmurs. Respiratory: CTAB, normal effort, No wheezes, rales or rhonchi Abdomen: Bowel sounds present, non-tender, non-distended, no hepatosplenomegaly Extremities: no edema or cyanosis. Skin: warm and dry, no rashes noted Neuro: alert, no obvious focal deficits Psych: Normal affect and mood   Assessment & Plan:    No problem-specific Assessment & Plan notes found for this encounter.    Sabino Dick, DO Family Medicine Resident

## 2022-02-09 ENCOUNTER — Ambulatory Visit: Payer: Medicaid Other | Admitting: Family Medicine

## 2022-11-25 ENCOUNTER — Other Ambulatory Visit: Payer: Self-pay

## 2022-11-25 ENCOUNTER — Emergency Department (HOSPITAL_COMMUNITY)
Admission: EM | Admit: 2022-11-25 | Discharge: 2022-11-25 | Disposition: A | Discharge status: Home / Self Care | Payer: Medicaid Other | Attending: Medical | Admitting: Medical

## 2022-11-25 DIAGNOSIS — I10 Essential (primary) hypertension: Secondary | ICD-10-CM | POA: Diagnosis not present

## 2022-11-25 DIAGNOSIS — Z91040 Latex allergy status: Secondary | ICD-10-CM | POA: Insufficient documentation

## 2022-11-25 DIAGNOSIS — R413 Other amnesia: Secondary | ICD-10-CM | POA: Diagnosis present

## 2022-11-25 DIAGNOSIS — D75839 Thrombocytosis, unspecified: Secondary | ICD-10-CM | POA: Diagnosis not present

## 2022-11-25 DIAGNOSIS — R6889 Other general symptoms and signs: Secondary | ICD-10-CM

## 2022-11-25 LAB — CBC WITH DIFFERENTIAL/PLATELET
Abs Immature Granulocytes: 0.03 10*3/uL (ref 0.00–0.07)
Basophils Absolute: 0 10*3/uL (ref 0.0–0.1)
Basophils Relative: 0 %
Eosinophils Absolute: 0 10*3/uL (ref 0.0–0.5)
Eosinophils Relative: 0 %
HCT: 37.2 % (ref 36.0–46.0)
Hemoglobin: 11.3 g/dL — ABNORMAL LOW (ref 12.0–15.0)
Immature Granulocytes: 0 %
Lymphocytes Relative: 31 %
Lymphs Abs: 3.3 10*3/uL (ref 0.7–4.0)
MCH: 21.6 pg — ABNORMAL LOW (ref 26.0–34.0)
MCHC: 30.4 g/dL (ref 30.0–36.0)
MCV: 71.3 fL — ABNORMAL LOW (ref 80.0–100.0)
Monocytes Absolute: 0.6 10*3/uL (ref 0.1–1.0)
Monocytes Relative: 6 %
Neutro Abs: 6.6 10*3/uL (ref 1.7–7.7)
Neutrophils Relative %: 63 %
Platelets: 424 10*3/uL — ABNORMAL HIGH (ref 150–400)
RBC: 5.22 MIL/uL — ABNORMAL HIGH (ref 3.87–5.11)
RDW: 18.2 % — ABNORMAL HIGH (ref 11.5–15.5)
WBC: 10.6 10*3/uL — ABNORMAL HIGH (ref 4.0–10.5)
nRBC: 0 % (ref 0.0–0.2)

## 2022-11-25 LAB — URINALYSIS, ROUTINE W REFLEX MICROSCOPIC
Bilirubin Urine: NEGATIVE
Glucose, UA: NEGATIVE mg/dL
Hgb urine dipstick: NEGATIVE
Ketones, ur: NEGATIVE mg/dL
Nitrite: NEGATIVE
Protein, ur: NEGATIVE mg/dL
Specific Gravity, Urine: <1.005 — ABNORMAL LOW (ref 1.005–1.030)
pH: 7 (ref 5.0–8.0)

## 2022-11-25 LAB — COMPREHENSIVE METABOLIC PANEL
ALT: 10 U/L (ref 0–44)
AST: 15 U/L (ref 15–41)
Albumin: 4 g/dL (ref 3.5–5.0)
Alkaline Phosphatase: 71 U/L (ref 38–126)
Anion gap: 14 (ref 5–15)
BUN: 7 mg/dL (ref 6–20)
CO2: 22 mmol/L (ref 22–32)
Calcium: 9.5 mg/dL (ref 8.9–10.3)
Chloride: 102 mmol/L (ref 98–111)
Creatinine, Ser: 0.79 mg/dL (ref 0.44–1.00)
GFR, Estimated: >60 mL/min (ref >60)
Glucose, Bld: 102 mg/dL — ABNORMAL HIGH (ref 70–99)
Potassium: 3.8 mmol/L (ref 3.5–5.1)
Sodium: 138 mmol/L (ref 135–145)
Total Bilirubin: 0.6 mg/dL (ref 0.3–1.2)
Total Protein: 8.3 g/dL — ABNORMAL HIGH (ref 6.5–8.1)

## 2022-11-25 LAB — URINALYSIS, MICROSCOPIC (REFLEX)

## 2022-11-25 LAB — RAPID URINE DRUG SCREEN, HOSP PERFORMED
Amphetamines: NOT DETECTED
Barbiturates: NOT DETECTED
Benzodiazepines: NOT DETECTED
Cocaine: NOT DETECTED
Opiates: NOT DETECTED
Tetrahydrocannabinol: NOT DETECTED

## 2022-11-25 LAB — SALICYLATE LEVEL: Salicylate Lvl: <7 mg/dL — ABNORMAL LOW (ref 7.0–30.0)

## 2022-11-25 LAB — I-STAT BETA HCG BLOOD, ED (MC, WL, AP ONLY): I-stat hCG, quantitative: <5 m[IU]/mL (ref <5)

## 2022-11-25 LAB — ETHANOL: Alcohol, Ethyl (B): <10 mg/dL (ref <10)

## 2022-11-25 LAB — ACETAMINOPHEN LEVEL: Acetaminophen (Tylenol), Serum: <10 ug/mL — ABNORMAL LOW (ref 10–30)

## 2022-11-25 NOTE — Discharge Instructions (Addendum)
At this time there does not appear to be the presence of an emergent medical condition, however there is always the potential for conditions to change. Please read and follow the below instructions.  Please return to the Emergency Department immediately for any new or worsening symptoms. Please be sure to follow up with your Primary Care Provider within one week regarding your visit today; please call their office to schedule an appointment even if you are feeling better for a follow-up visit. Please go to the behavioral health urgent care on your discharge summary for further evaluation.  You may return to the emergency department at any time.   Please read the additional information packets attached to your discharge summary.  Go to the nearest Emergency Department immediately if: You have fever or chills You have chest pain or trouble breathing You have abdominal pain, nausea or vomiting You have diarrhea You have burning when you pee or blood in your urine You have a bad headache or vision changes You have any new/concerning or worsening of symptoms.  Do not take your medicine if  develop an itchy rash, swelling in your mouth or lips, or difficulty breathing; call 911 and seek immediate emergency medical attention if this occurs.  You may review your lab tests and imaging results in their entirety on your MyChart account.  Please discuss all results of fully with your primary care provider and other specialist at your follow-up visit.  Note: Portions of this text may have been transcribed using voice recognition software. Every effort was made to ensure accuracy; however, inadvertent computerized transcription errors may still be present.

## 2022-11-25 NOTE — ED Notes (Signed)
Pt ambulatory to b/r, steady gait, alert, NAD, calm, interactive.

## 2022-11-25 NOTE — ED Provider Notes (Signed)
[facility] Provider Note   CSN: [id number] Arrival date & time: 11/25/22  [id number]     History  Chief Complaint  Patient presents with   Memory Loss    Melinda Jimenez is a 37 y.o. female history includes PE, GERD, IBS, bipolar, hypertension.  Patient brought in by EMS for concern of memory loss and not feeling quite right.  Patient reports that over the past 2-3 days she has been having trouble remembering things, she reports this only happens for a brief amount of time then she is eventually able to remember things she needs to do but she does not have specific examples of what she is forgetting.  She reports this is never happened to her before. She appears somewhat hesitant to answer questions.  She reports that she is able to perform ADLs and is no longer having issues with her memories.  She also reports feeling just "not right" but would not elaborate. She denies any associated pain. She denies any drug or alcohol use.  She denies SI/HI or hallucinations.  She does report 1 episode of emesis yesterday, nonbloody/nonbilious.  She denies any recent illness, fever, abdominal pain, dysuria/hematuria or any additional concerns. She is asking for food and drink.  HPI     Home Medications Prior to Admission medications   Medication Sig Start Date End Date Taking? Authorizing Provider  acetaminophen (TYLENOL) 325 MG tablet Take 2 tablets (650 mg total) by mouth every 4 (four) hours as needed for mild pain, moderate pain, fever or headache (pain >4, fever >101.5*F). 10/12/20   Gladys Damme, MD  doxycycline (VIBRAMYCIN) 100 MG capsule Take 1 capsule (100 mg total) by mouth 2 (two) times daily. 07/25/21   Woodroe Mode, MD  metoCLOPramide (REGLAN) 10 MG tablet Take 1 tablet (10 mg total) by mouth every 8 (eight) hours as needed for nausea. 07/21/21   Jorje Guild, NP  metroNIDAZOLE (FLAGYL) 500 MG tablet Take 1 tablet (500 mg total) by mouth 2  (two) times daily. 10/24/21   Constant, Peggy, MD  misoprostol (CYTOTEC) 200 MCG tablet Take 4 tablets (800 mcg total) by mouth daily as needed (take second dose in 24 hours if no heavy bleeding). 07/21/21   Jorje Guild, NP  fluticasone [facility]) 50 MCG/ACT nasal spray Place 1 spray into both nostrils daily. Use for 2-3 weeks to help with the nasal congestion and ear pain Patient not taking: Reported on 01/21/2020 01/20/20 02/11/20  Blanchie Dessert, MD  sertraline (ZOLOFT) 25 MG tablet Take one tablet daily. If tolerating, increase to two tablets in one week. Patient not taking: Reported on 01/21/2020 01/18/20 02/11/20  Nicolette Bang, MD  zolpidem (AMBIEN) 5 MG tablet Take 1 tablet (5 mg total) by mouth at bedtime as needed for sleep. Patient not taking: Reported on 01/21/2020 01/20/20 02/11/20  Blanchie Dessert, MD      Allergies    Latex and Zithromax [azithromycin]    Review of Systems   Review of Systems Ten systems are reviewed and are negative for acute change except as noted in the HPI  Physical Exam Updated Vital Signs BP (!) 140/95   Pulse 80   Temp 98.9 F (37.2 C)   Resp 18   SpO2 100%  Physical Exam Constitutional:      General: She is not in acute distress.    Appearance: Normal appearance. She is well-developed. She is not ill-appearing or diaphoretic.  HENT:     Head:  Normocephalic and atraumatic.  Eyes:     General: Vision grossly intact. Gaze aligned appropriately.     Pupils: Pupils are equal, round, and reactive to light.  Neck:     Trachea: Trachea and phonation normal.  Pulmonary:     Effort: Pulmonary effort is normal. No respiratory distress.  Abdominal:     General: There is no distension.     Palpations: Abdomen is soft.     Tenderness: There is no abdominal tenderness. There is no guarding or rebound.  Musculoskeletal:        General: Normal range of motion.     Cervical back: Normal range of motion.  Skin:    General: Skin is warm and  dry.  Neurological:     Mental Status: She is alert.     GCS: GCS eye subscore is 4. GCS verbal subscore is 5. GCS motor subscore is 6.     Comments: Speech is clear and goal oriented, follows commands Major Cranial nerves without deficit, no facial droop Moves extremities without ataxia, coordination intact  Psychiatric:        Attention and Perception: She is inattentive. She does not perceive auditory or visual hallucinations.        Mood and Affect: Mood is anxious.        Behavior: Behavior is withdrawn. Behavior is cooperative.        Thought Content: Thought content does not include homicidal or suicidal ideation.     ED Results / Procedures / Treatments   Labs (all labs ordered are listed, but only abnormal results are displayed) Labs Reviewed  COMPREHENSIVE METABOLIC PANEL - Abnormal; Notable for the following components:      Result Value   Glucose, Bld 102 (*)    Total Protein 8.3 (*)    All other components within normal limits  CBC WITH DIFFERENTIAL/PLATELET - Abnormal; Notable for the following components:   WBC 10.6 (*)    RBC 5.22 (*)    Hemoglobin 11.3 (*)    MCV 71.3 (*)    MCH 21.6 (*)    RDW 18.2 (*)    Platelets 424 (*)    All other components within normal limits  SALICYLATE LEVEL - Abnormal; Notable for the following components:   Salicylate Lvl Q000111Q (*)    All other components within normal limits  ACETAMINOPHEN LEVEL - Abnormal; Notable for the following components:   Acetaminophen (Tylenol), Serum <10 (*)    All other components within normal limits  URINALYSIS, ROUTINE W REFLEX MICROSCOPIC - Abnormal; Notable for the following components:   Specific Gravity, Urine <1.005 (*)    Leukocytes,Ua TRACE (*)    All other components within normal limits  URINALYSIS, MICROSCOPIC (REFLEX) - Abnormal; Notable for the following components:   Bacteria, UA RARE (*)    All other components within normal limits  ETHANOL  RAPID URINE DRUG SCREEN, HOSP  PERFORMED  I-STAT BETA HCG BLOOD, ED ([facility], [facility], [facility] ONLY)  CBG MONITORING, ED    EKG EKG Interpretation  Date/Time:  Sunday November 25 2022 08:09:16 EST Ventricular Rate:  91 PR Interval:  145 QRS Duration: 91 QT Interval:  346 QTC Calculation: 426 R Axis:   63 Text Interpretation: Sinus rhythm No significant change since last tracing Confirmed by Isla Pence [phone]) on 11/25/2022 9:12:31 AM  Radiology No results found.  Procedures Procedures    Medications Ordered in ED Medications - No data to display  ED Course/ Medical Decision Making/ A&P  Medical Decision Making 37 year old female history as above presented for concerns of intermittent memory loss over the past 2-3 days as well as "not feeling right".  On evaluation she is in no acute distress, alert and oriented x 3 and follows commands appropriately.  Cranial nerves intact.  Vital signs are stable.  Appropriate motion coordination of all 4 extremities.  Abdomen soft nontender.  No SI/HI but does appear withdrawn/inattentive and anxious on exam.  She denies any drug/alcohol use.  She denies hallucinations.  Her memory issues have resolved.  Low suspicion for CVA, no indication for neuroimaging at this point.  No associated chest pain no indication for cardiac workup.  Differential includes but not limited to substance abuse, viral illness, anxiety.  Will obtain medical screening labs along with EKG.   Amount and/or Complexity of Data Reviewed External Data Reviewed: notes.    Details: Reviewed prior outpatient notes Labs: ordered.    Details:  Pregnancy test negative Ethanol negative, patient does not appear to be in withdrawal Tylenol and salicylate levels negative CBC shows mild leukocytosis of 10.6.  Mild anemia of 11.3.  Thrombocytosis of 424.  These appear similar to prior. CMP shows no emergent electrolyte derangement, AKI, LFT elevations or gap. Urinalysis shows trace leukocytes  and rare bacteria.  No WBCs or nitrites.  Low suspicion for UTI. UDS negative ECG/medicine tests:     Details: I personally reviewed and interpreted patient's twelve-lead EKG.  Shows normal sinus rhythm rate 91 bpm.  I do not appreciate any obvious acute ischemic changes.  Risk Risk Details: Patient was reassessed she is resting company bed no acute distress.  She reports that she called her mother and her mother is coming to pick her up.  I went over lab results with patient today, overall they are reassuring.  I had initially discussed consulting TTS for patient's anxiety, initially she did want talk to TTS but after talking with her mother she decided that she wanted to leave instead.  She denies any history of suicidal or homicidal ideations, she does not have any hallucinations and does not appear to be under the influence of alcohol or drugs.  She appears stable for outpatient follow-up I will give her behavioral health urgent care contact and I encouraged her to go there for further evaluation of her anxiety.  Strict ER precautions given.  At time of discharge she is fully alert and oriented and is able to follow commands, she does not appear to be a danger to herself or others at this time.  No indication for IVC or further ER workup at this time.  Case was discussed with Dr. Gilford Raid who agrees with discharge at this time.    At this time there does not appear to be any evidence of an acute emergency medical condition and the patient appears stable for discharge with appropriate outpatient follow up. Diagnosis was discussed with patient who verbalizes understanding of care plan and is agreeable to discharge. I have discussed return precautions with patient who verbalizes understanding. Patient encouraged to follow-up with their PCP and [place]. All questions answered.    Note: Portions of this report may have been transcribed using voice recognition software. Every effort was made to ensure  accuracy; however, inadvertent computerized transcription errors may still be present.         Final Clinical Impression(s) / ED Diagnoses Final diagnoses:  Forgetfulness    Rx / DC Orders ED Discharge Orders     None  Deliah Boston, PA-C 11/25/22 [id number]    Isla Pence, MD 11/25/22 1025

## 2022-11-25 NOTE — ED Notes (Signed)
Pt no longer wanting to speak with TTS. Pt wanting to leave. Does not want to stay or wait. Declining VS, speaking with PA, or received d/c paperwork. Pt called mom, who is out front here to pick her up. Alert, NAD, calm, steady gait, speech clear.

## 2022-11-25 NOTE — ED Notes (Addendum)
EDPA at [facility]. Pt waiting to speak with psychiatry/ TTS.

## 2022-11-25 NOTE — ED Triage Notes (Signed)
Pt bib [facility] pt stated she hasn't felt good for three days. She felt like she is forgetting things.  Ems vitals 138/98 Hr-96 98% ra

## 2022-11-26 ENCOUNTER — Emergency Department (HOSPITAL_COMMUNITY)
Admission: EM | Admit: 2022-11-26 | Discharge: 2022-11-26 | Discharge status: Against Medical Advice | Payer: Medicaid Other | Attending: Emergency Medicine | Admitting: Emergency Medicine

## 2022-11-26 ENCOUNTER — Emergency Department (HOSPITAL_COMMUNITY): Payer: Medicaid Other

## 2022-11-26 DIAGNOSIS — Z91040 Latex allergy status: Secondary | ICD-10-CM | POA: Insufficient documentation

## 2022-11-26 DIAGNOSIS — R0789 Other chest pain: Secondary | ICD-10-CM | POA: Diagnosis not present

## 2022-11-26 DIAGNOSIS — D649 Anemia, unspecified: Secondary | ICD-10-CM | POA: Diagnosis not present

## 2022-11-26 DIAGNOSIS — R519 Headache, unspecified: Secondary | ICD-10-CM | POA: Diagnosis present

## 2022-11-26 DIAGNOSIS — D72829 Elevated white blood cell count, unspecified: Secondary | ICD-10-CM | POA: Diagnosis not present

## 2022-11-26 DIAGNOSIS — R531 Weakness: Secondary | ICD-10-CM

## 2022-11-26 DIAGNOSIS — R7309 Other abnormal glucose: Secondary | ICD-10-CM | POA: Diagnosis not present

## 2022-11-26 DIAGNOSIS — Z5329 Procedure and treatment not carried out because of patient's decision for other reasons: Secondary | ICD-10-CM | POA: Diagnosis not present

## 2022-11-26 LAB — COMPREHENSIVE METABOLIC PANEL
ALT: 9 U/L (ref 0–44)
AST: 13 U/L — ABNORMAL LOW (ref 15–41)
Albumin: 3.9 g/dL (ref 3.5–5.0)
Alkaline Phosphatase: 65 U/L (ref 38–126)
Anion gap: 12 (ref 5–15)
BUN: 11 mg/dL (ref 6–20)
CO2: 25 mmol/L (ref 22–32)
Calcium: 9.2 mg/dL (ref 8.9–10.3)
Chloride: 102 mmol/L (ref 98–111)
Creatinine, Ser: 0.89 mg/dL (ref 0.44–1.00)
GFR, Estimated: >60 mL/min (ref >60)
Glucose, Bld: 122 mg/dL — ABNORMAL HIGH (ref 70–99)
Potassium: 3.5 mmol/L (ref 3.5–5.1)
Sodium: 139 mmol/L (ref 135–145)
Total Bilirubin: 1 mg/dL (ref 0.3–1.2)
Total Protein: 8.2 g/dL — ABNORMAL HIGH (ref 6.5–8.1)

## 2022-11-26 LAB — CBC WITH DIFFERENTIAL/PLATELET
Abs Immature Granulocytes: 0.04 10*3/uL (ref 0.00–0.07)
Basophils Absolute: 0 10*3/uL (ref 0.0–0.1)
Basophils Relative: 0 %
Eosinophils Absolute: 0.1 10*3/uL (ref 0.0–0.5)
Eosinophils Relative: 1 %
HCT: 36.4 % (ref 36.0–46.0)
Hemoglobin: 11 g/dL — ABNORMAL LOW (ref 12.0–15.0)
Immature Granulocytes: 0 %
Lymphocytes Relative: 36 %
Lymphs Abs: 4.6 10*3/uL — ABNORMAL HIGH (ref 0.7–4.0)
MCH: 21.7 pg — ABNORMAL LOW (ref 26.0–34.0)
MCHC: 30.2 g/dL (ref 30.0–36.0)
MCV: 71.8 fL — ABNORMAL LOW (ref 80.0–100.0)
Monocytes Absolute: 0.7 10*3/uL (ref 0.1–1.0)
Monocytes Relative: 5 %
Neutro Abs: 7.4 10*3/uL (ref 1.7–7.7)
Neutrophils Relative %: 58 %
Platelets: 435 10*3/uL — ABNORMAL HIGH (ref 150–400)
RBC: 5.07 MIL/uL (ref 3.87–5.11)
RDW: 18.4 % — ABNORMAL HIGH (ref 11.5–15.5)
WBC: 12.8 10*3/uL — ABNORMAL HIGH (ref 4.0–10.5)
nRBC: 0 % (ref 0.0–0.2)

## 2022-11-26 LAB — ACETAMINOPHEN LEVEL: Acetaminophen (Tylenol), Serum: <10 ug/mL — ABNORMAL LOW (ref 10–30)

## 2022-11-26 LAB — I-STAT BETA HCG BLOOD, ED (MC, WL, AP ONLY): I-stat hCG, quantitative: <5 m[IU]/mL (ref <5)

## 2022-11-26 LAB — CK: Total CK: 114 U/L (ref 38–234)

## 2022-11-26 LAB — SALICYLATE LEVEL: Salicylate Lvl: <7 mg/dL — ABNORMAL LOW (ref 7.0–30.0)

## 2022-11-26 LAB — CBG MONITORING, ED: Glucose-Capillary: 132 mg/dL — ABNORMAL HIGH (ref 70–99)

## 2022-11-26 LAB — ETHANOL: Alcohol, Ethyl (B): <10 mg/dL (ref <10)

## 2022-11-26 NOTE — ED Notes (Signed)
EMS reports pt was ambulatory at home without difficulty.

## 2022-11-26 NOTE — Discharge Instructions (Signed)
Your lab work does not show any new concerns.  It appears stable from yesterday.  We recommended a CT of your head for further evaluation however you declined today.  Please return if you change your mind.  Return if you have weakness in your arms or legs, slurred speech, trouble walking or talking, confusion, or trouble with your balance.

## 2022-11-26 NOTE — ED Provider Triage Note (Signed)
Emergency Medicine Provider Triage Evaluation Note  CAMBRIDGE HAYMON , a 37 y.o. female  was evaluated in triage.  Pt complains of Currently, the patient is brought in by EMS with fatigue, generalized weakness and syncope.  EMS was actually called for patient's daughter who had a seizure.  In front of EMS patient was complaining of feeling weak and lightheaded.  Did have a syncopal episode as witnessed by them.  No head injury.  She was seen yesterday for similar complaints as well as forgetfulness. Patient with difficulty answering questions directly.  She appears to have a flat affect.  Says she has some chest pain and feels weak all over.  Falls asleep frequently in triage.  Denies any alcohol or drug use.  Review of Systems  Positive: Fatigue, generalized weakness, chest pain, forgetfulness Negative: Abdominal pain, nausea or vomiting.  Physical Exam  BP (!) 140/103   Pulse (!) 101   Temp 98.4 F (36.9 C) (Oral)   Resp 18   Ht '5\' 7"'$  (1.702 m)   SpO2 99%   BMI 38.37 kg/m  Gen:   Awake, no distress   Resp:  Normal effort  MSK:   Moves extremities without difficulty  Other:  Generalized weakness with poor effort on neurological exam. No lateralizing deficits  Medical Decision Making  Medically screening exam initiated at 12:46 PM.  Appropriate orders placed.  RANASIA MASSARELLI was informed that the remainder of the evaluation will be completed by another provider, this initial triage assessment does not replace that evaluation, and the importance of remaining in the ED until their evaluation is complete.     Ezequiel Essex, MD 11/26/22 1247

## 2022-11-26 NOTE — ED Provider Notes (Signed)
[name] Provider Note   CSN: [id number] Arrival date & time: 11/26/22  1225     History  Chief Complaint  Patient presents with   Fatigue   Chest Pain    Melinda Jimenez is a 37 y.o. female.  Patient presents for evaluation of multiple complaints.  Patient states that over the past several days she has had episodes where she "passes out" however upon further description describes episodes where she cannot remember what happened.  She was seen yesterday at [facility] emergency department for similar complaint and was discharged.  Today she also reports generalized headache without head trauma, intermittent chest pains, as well as generalized weakness and fatigue.  She states that she has had difficulty moving the left side of her body today, initially was talking about trouble moving her leg but also states that her arm was weak.  Patient was transported by EMS after her daughter was transported for seizure.  Patient denies hitting her head.  She has not had similar spells in the past.  She does not have a reported history of seizures.  Denies substance abuse.  Patient denies signs of stroke including: facial droop, slurred speech, aphasia, imbalance/trouble walking.        Home Medications Prior to Admission medications   Medication Sig Start Date End Date Taking? Authorizing Provider  acetaminophen (TYLENOL) 325 MG tablet Take 2 tablets (650 mg total) by mouth every 4 (four) hours as needed for mild pain, moderate pain, fever or headache (pain >4, fever >101.5*F). 10/12/20   Gladys Damme, MD  doxycycline (VIBRAMYCIN) 100 MG capsule Take 1 capsule (100 mg total) by mouth 2 (two) times daily. 07/25/21   Woodroe Mode, MD  metoCLOPramide (REGLAN) 10 MG tablet Take 1 tablet (10 mg total) by mouth every 8 (eight) hours as needed for nausea. 07/21/21   Jorje Guild, NP  metroNIDAZOLE (FLAGYL) 500 MG tablet Take 1 tablet (500 mg total)  by mouth 2 (two) times daily. 10/24/21   Constant, Peggy, MD  misoprostol (CYTOTEC) 200 MCG tablet Take 4 tablets (800 mcg total) by mouth daily as needed (take second dose in 24 hours if no heavy bleeding). 07/21/21   Jorje Guild, NP  fluticasone [facility]) 50 MCG/ACT nasal spray Place 1 spray into both nostrils daily. Use for 2-3 weeks to help with the nasal congestion and ear pain Patient not taking: Reported on 01/21/2020 01/20/20 02/11/20  Blanchie Dessert, MD  sertraline (ZOLOFT) 25 MG tablet Take one tablet daily. If tolerating, increase to two tablets in one week. Patient not taking: Reported on 01/21/2020 01/18/20 02/11/20  Nicolette Bang, MD  zolpidem (AMBIEN) 5 MG tablet Take 1 tablet (5 mg total) by mouth at bedtime as needed for sleep. Patient not taking: Reported on 01/21/2020 01/20/20 02/11/20  Blanchie Dessert, MD      Allergies    Latex and Zithromax [azithromycin]    Review of Systems   Review of Systems  Physical Exam Updated Vital Signs BP (!) 140/103   Pulse (!) 101   Temp 98.4 F (36.9 C) (Oral)   Resp 18   Ht '5\' 7"'$  (1.702 m)   SpO2 99%   BMI 38.37 kg/m   Physical Exam Vitals and nursing note reviewed.  Constitutional:      Appearance: She is well-developed.  HENT:     Head: Normocephalic and atraumatic.     Right Ear: Tympanic membrane, ear canal and external ear normal.  Left Ear: Tympanic membrane, ear canal and external ear normal.     Nose: Nose normal.     Mouth/Throat:     Pharynx: Uvula midline.  Eyes:     General: Lids are normal.     Extraocular Movements:     Right eye: No nystagmus.     Left eye: No nystagmus.     Conjunctiva/sclera: Conjunctivae normal.     Pupils: Pupils are equal, round, and reactive to light.  Cardiovascular:     Rate and Rhythm: Normal rate and regular rhythm.  Pulmonary:     Effort: Pulmonary effort is normal.     Breath sounds: Normal breath sounds.  Abdominal:     Palpations: Abdomen is soft.      Tenderness: There is no abdominal tenderness.  Musculoskeletal:     Cervical back: Normal range of motion and neck supple. No tenderness or bony tenderness.  Skin:    General: Skin is warm and dry.  Neurological:     Mental Status: She is alert and oriented to person, place, and time.     GCS: GCS eye subscore is 4. GCS verbal subscore is 5. GCS motor subscore is 6.     Cranial Nerves: No cranial nerve deficit.     Sensory: No sensory deficit.     Motor: No weakness.     Coordination: Coordination normal.     Gait: Gait normal.     Comments: Upper extremity myotomes tested bilaterally:  C5 Shoulder abduction 5/5 C6 Elbow flexion/wrist extension 5/5 C7 Elbow extension 5/5 C8 Finger flexion 5/5 T1 Finger abduction 5/5  Lower extremity myotomes tested bilaterally: L2 Hip flexion 5/5 L3 Knee extension 5/5 L4 Ankle dorsiflexion 5/5 S1 Ankle plantar flexion 5/5      ED Results / Procedures / Treatments   Labs (all labs ordered are listed, but only abnormal results are displayed) Labs Reviewed  CBC WITH DIFFERENTIAL/PLATELET - Abnormal; Notable for the following components:      Result Value   WBC 12.8 (*)    Hemoglobin 11.0 (*)    MCV 71.8 (*)    MCH 21.7 (*)    RDW 18.4 (*)    Platelets 435 (*)    Lymphs Abs 4.6 (*)    All other components within normal limits  COMPREHENSIVE METABOLIC PANEL - Abnormal; Notable for the following components:   Glucose, Bld 122 (*)    Total Protein 8.2 (*)    AST 13 (*)    All other components within normal limits  ACETAMINOPHEN LEVEL - Abnormal; Notable for the following components:   Acetaminophen (Tylenol), Serum <10 (*)    All other components within normal limits  SALICYLATE LEVEL - Abnormal; Notable for the following components:   Salicylate Lvl Q000111Q (*)    All other components within normal limits  CBG MONITORING, ED - Abnormal; Notable for the following components:   Glucose-Capillary 132 (*)    All other components within  normal limits  ETHANOL  CK  RAPID URINE DRUG SCREEN, HOSP PERFORMED  I-STAT BETA HCG BLOOD, ED ([facility], [facility], [facility] ONLY)    EKG None  Radiology No results found.  Procedures Procedures    Medications Ordered in ED Medications - No data to display  ED Course/ Medical Decision Making/ A&P    1:48 PM Patient not in bed, checked twice.   2:30 PM Patient seen and examined. History obtained directly from patient.  I also reviewed the workup and history from her ED  visit yesterday.  Reviewed all labs.  For me she is awake and alert, compliant with history and physical exam.  She is able to stand up without assistance and ambulate in the hallway without difficulty.  Labs/EKG: Labs ordered in triage reviewed and interpreted including: CBC with differential with mildly elevated white blood cell count at 12.8, mild anemia with hemoglobin of 11.0 microcytic otherwise unremarkable; CMP unremarkable; ethanol negative; acetaminophen and salicylate undetectable; CK 114; istat beta neg pregnancy.   Imaging: Ordered head CT however patient declines.  Medications/Fluids: None ordered  Most recent vital signs reviewed and are as follows: BP (!) 140/103   Pulse (!) 101   Temp 98.4 F (36.9 C) (Oral)   Resp 18   Ht '5\' 7"'$  (1.702 m)   SpO2 99%   BMI 38.37 kg/m   I reviewed lab work with patient at bedside.  I strongly encouraged her to undergo CT imaging of the head and given her headache and reported difficulty moving the left side of her body today.  Patient declines several times.  Discussed risks and benefits.  Patient states that she does not want head CT imaging because she wants to go to the peds emergency department and be with her daughter.  She states that she wants to be a "responsible parent" and take her daughter home today.  After that time, she will consider returning to the emergency department for further evaluation.  I invited the patient to certainly return at any time if she decides  that she wants further workup for her symptoms.  In the interim, I will place an ambulatory referral to neurology for outpatient treatment and evaluation.  I also recommended close follow-up with her PCP and asked her to call for an appointment.  Will plan to have the patient leave [facility].  I printed out her discharge instructions with referral information.  Initial impression: Patient with multiple symptoms, cannot rule out seizure.  Overall low concern for TIA or stroke.  Low concern for encephalitis, meningitis.  Patient seems to have capacity to make medical decisions and decided that she does not want additional imaging so that she can go care for her daughter today.  AMA discussion as above.                              Medical Decision Making  In regards to the patient's headache, critical differentials were considered including subarachnoid hemorrhage, intracerebral hemorrhage, epidural/subdural hematoma, pituitary apoplexy, vertebral/carotid artery dissection, giant cell arteritis, central venous thrombosis, reversible cerebral vasoconstriction, acute angle closure glaucoma, idiopathic intracranial hypertension, bacterial meningitis, viral encephalitis, carbon monoxide poisoning, posterior reversible encephalopathy syndrome, pre-eclampsia.   Reg flag symptoms related to these causes were considered including systemic symptoms (fever, weight loss), neurologic symptoms (confusion, mental status change, vision change, associated seizure), acute or sudden "thunderclap" onset, patient age 27 or older with new or progressive headache, patient of any age with first headache or change in headache pattern, pregnant or postpartum status, history of HIV or other immunocompromise, history of cancer, headache occurring with exertion, associated neck or shoulder pain, associated traumatic injury, concurrent use of anticoagulation, family history of spontaneous SAH, and concurrent drug use.     Other benign, more common causes of headache were considered including migraine, tension-type headache, cluster headache, referred pain from other cause such as sinus infection, dental pain, trigeminal neuralgia.   On exam, patient has a reassuring neuro exam including  baseline mental status, no significant neck pain or meningeal signs, no signs of severe infection or fever --at time of initial exam.  She declines further workup at this time.  In regards to chest pain, shortness of breath --symptoms are atypical for ACS or PE.           Final Clinical Impression(s) / ED Diagnoses Final diagnoses:  Acute nonintractable headache, unspecified headache type  Weakness    Rx / DC Orders ED Discharge Orders          Ordered    Ambulatory referral to Neurology       Comments: An appointment is requested in approximately: 1 week for 'forgetful' vs unconscious spells, opinion for possible seizure   11/26/22 1433              Carlisle Cater, PA-C 11/26/22 [place], DO 11/27/22 [phone]

## 2022-11-26 NOTE — ED Notes (Signed)
Pt was not in her bed when this RN went to speak with her about getting a CT.

## 2022-11-26 NOTE — ED Notes (Signed)
Pt refused to go to CT due to her wanting to check on her daughter in the peds ed nurse was notified.

## 2022-11-26 NOTE — ED Triage Notes (Addendum)
Pt BIB EMS from home. Pt presents with multiple complaints, fatigue, chest pain, Syncope "Forgetting things" evaluated in ED yesterday for similar complaints.  Last VS 180/106, 100%RA, 100 p. #20 LAC- No medications given by EMS.

## 2022-12-15 ENCOUNTER — Telehealth: Payer: Medicaid Other | Admitting: Physician Assistant

## 2022-12-15 NOTE — Progress Notes (Unsigned)
The patient no-showed for appointment despite this provider sending direct link, reaching out via phone with no response and waiting for at least 10 minutes from appointment time for patient to join. They will be marked as a NS for this appointment/time.   Glory Graefe, PA    

## 2022-12-27 ENCOUNTER — Emergency Department (HOSPITAL_COMMUNITY): Payer: Medicaid Other

## 2022-12-27 ENCOUNTER — Emergency Department (HOSPITAL_COMMUNITY)
Admission: EM | Admit: 2022-12-27 | Discharge: 2022-12-27 | Discharge status: Against Medical Advice | Payer: Medicaid Other | Attending: Emergency Medicine | Admitting: Emergency Medicine

## 2022-12-27 ENCOUNTER — Other Ambulatory Visit: Payer: Self-pay

## 2022-12-27 DIAGNOSIS — F32A Depression, unspecified: Secondary | ICD-10-CM | POA: Insufficient documentation

## 2022-12-27 DIAGNOSIS — R42 Dizziness and giddiness: Secondary | ICD-10-CM | POA: Diagnosis not present

## 2022-12-27 DIAGNOSIS — R519 Headache, unspecified: Secondary | ICD-10-CM | POA: Insufficient documentation

## 2022-12-27 DIAGNOSIS — R55 Syncope and collapse: Secondary | ICD-10-CM | POA: Diagnosis not present

## 2022-12-27 DIAGNOSIS — R531 Weakness: Secondary | ICD-10-CM | POA: Insufficient documentation

## 2022-12-27 DIAGNOSIS — Z5329 Procedure and treatment not carried out because of patient's decision for other reasons: Secondary | ICD-10-CM | POA: Insufficient documentation

## 2022-12-27 DIAGNOSIS — Z91040 Latex allergy status: Secondary | ICD-10-CM | POA: Insufficient documentation

## 2022-12-27 LAB — CBC WITH DIFFERENTIAL/PLATELET
Abs Immature Granulocytes: 0.03 10*3/uL (ref 0.00–0.07)
Basophils Absolute: 0.1 10*3/uL (ref 0.0–0.1)
Basophils Relative: 0 %
Eosinophils Absolute: 0.1 10*3/uL (ref 0.0–0.5)
Eosinophils Relative: 1 %
HCT: 35.8 % — ABNORMAL LOW (ref 36.0–46.0)
Hemoglobin: 10.5 g/dL — ABNORMAL LOW (ref 12.0–15.0)
Immature Granulocytes: 0 %
Lymphocytes Relative: 39 %
Lymphs Abs: 4.6 10*3/uL — ABNORMAL HIGH (ref 0.7–4.0)
MCH: 21.3 pg — ABNORMAL LOW (ref 26.0–34.0)
MCHC: 29.3 g/dL — ABNORMAL LOW (ref 30.0–36.0)
MCV: 72.5 fL — ABNORMAL LOW (ref 80.0–100.0)
Monocytes Absolute: 0.6 10*3/uL (ref 0.1–1.0)
Monocytes Relative: 5 %
Neutro Abs: 6.4 10*3/uL (ref 1.7–7.7)
Neutrophils Relative %: 55 %
Platelets: 340 10*3/uL (ref 150–400)
RBC: 4.94 MIL/uL (ref 3.87–5.11)
RDW: 18.5 % — ABNORMAL HIGH (ref 11.5–15.5)
WBC: 11.8 10*3/uL — ABNORMAL HIGH (ref 4.0–10.5)
nRBC: 0 % (ref 0.0–0.2)

## 2022-12-27 LAB — COMPREHENSIVE METABOLIC PANEL
ALT: 10 U/L (ref 0–44)
AST: 14 U/L — ABNORMAL LOW (ref 15–41)
Albumin: 3.7 g/dL (ref 3.5–5.0)
Alkaline Phosphatase: 71 U/L (ref 38–126)
Anion gap: 12 (ref 5–15)
BUN: 12 mg/dL (ref 6–20)
CO2: 22 mmol/L (ref 22–32)
Calcium: 9.1 mg/dL (ref 8.9–10.3)
Chloride: 103 mmol/L (ref 98–111)
Creatinine, Ser: 0.76 mg/dL (ref 0.44–1.00)
GFR, Estimated: >60 mL/min (ref >60)
Glucose, Bld: 110 mg/dL — ABNORMAL HIGH (ref 70–99)
Potassium: 3.5 mmol/L (ref 3.5–5.1)
Sodium: 137 mmol/L (ref 135–145)
Total Bilirubin: 0.8 mg/dL (ref 0.3–1.2)
Total Protein: 8.1 g/dL (ref 6.5–8.1)

## 2022-12-27 LAB — LIPASE, BLOOD: Lipase: 24 U/L (ref 11–51)

## 2022-12-27 MED ORDER — DIPHENHYDRAMINE HCL 50 MG/ML IJ SOLN
25.0000 mg | Freq: Once | INTRAMUSCULAR | Status: DC
  Filled 2022-12-27: qty 1

## 2022-12-27 MED ORDER — METOCLOPRAMIDE HCL 5 MG/ML IJ SOLN
10.0000 mg | Freq: Once | INTRAMUSCULAR | Status: DC
  Filled 2022-12-27: qty 2

## 2022-12-27 MED ORDER — SODIUM CHLORIDE 0.9 % IV BOLUS: 1000.0000 mL | Freq: Once | INTRAVENOUS | Status: DC

## 2022-12-27 NOTE — ED Provider Notes (Signed)
[facility] Provider Note   CSN: [id number] Arrival date & time: 12/27/22  1758     History  Chief Complaint  Patient presents with   Weakness   Near Syncope    Melinda Jimenez is a 37 y.o. female.  Patient with history of anxiety, brief psychotic disorder [facility] notes from 03/2020) --presents to the emergency department today voicing lightheadedness after she got out of the shower.  She also reports 1 month of left-sided headache and generalized weakness.  I actually saw this patient about a month ago and she left AMA to attend to her daughter.  She was complaining about headache at that time and left-sided weakness.  Today she states that she has a left-sided headache and feels generally weak.  I did place a neurology referral on her behalf, but I do not see any follow-up notes.  Patient denies fever or URI symptoms.  She denies chest pain.  She states that she has been having decreased appetite, vomiting liquids and not vomiting solids.  No diarrhea.  No urinary symptoms.  She just finished her menstrual period which was normal per her report.       Home Medications Prior to Admission medications   Medication Sig Start Date End Date Taking? Authorizing Provider  acetaminophen (TYLENOL) 325 MG tablet Take 2 tablets (650 mg total) by mouth every 4 (four) hours as needed for mild pain, moderate pain, fever or headache (pain >4, fever >101.5*F). 10/12/20   Gladys Damme, MD  doxycycline (VIBRAMYCIN) 100 MG capsule Take 1 capsule (100 mg total) by mouth 2 (two) times daily. 07/25/21   Woodroe Mode, MD  metoCLOPramide (REGLAN) 10 MG tablet Take 1 tablet (10 mg total) by mouth every 8 (eight) hours as needed for nausea. 07/21/21   Jorje Guild, NP  metroNIDAZOLE (FLAGYL) 500 MG tablet Take 1 tablet (500 mg total) by mouth 2 (two) times daily. 10/24/21   Constant, Peggy, MD  misoprostol (CYTOTEC) 200 MCG tablet Take 4 tablets (800 mcg total) by  mouth daily as needed (take second dose in 24 hours if no heavy bleeding). 07/21/21   Jorje Guild, NP  fluticasone [facility]) 50 MCG/ACT nasal spray Place 1 spray into both nostrils daily. Use for 2-3 weeks to help with the nasal congestion and ear pain Patient not taking: Reported on 01/21/2020 01/20/20 02/11/20  Blanchie Dessert, MD  sertraline (ZOLOFT) 25 MG tablet Take one tablet daily. If tolerating, increase to two tablets in one week. Patient not taking: Reported on 01/21/2020 01/18/20 02/11/20  Nicolette Bang, MD  zolpidem (AMBIEN) 5 MG tablet Take 1 tablet (5 mg total) by mouth at bedtime as needed for sleep. Patient not taking: Reported on 01/21/2020 01/20/20 02/11/20  Blanchie Dessert, MD      Allergies    Latex and Zithromax [azithromycin]    Review of Systems   Review of Systems  Physical Exam Updated Vital Signs BP (!) 150/98 (BP Location: Right Arm)   Pulse 98   Temp 97.9 F (36.6 C) (Oral)   Resp 16   Ht 5\' 7"  (1.702 m)   Wt 111.1 kg   SpO2 100%   BMI 38.36 kg/m  Physical Exam Vitals and nursing note reviewed.  Constitutional:      General: She is not in acute distress.    Appearance: She is well-developed.  HENT:     Head: Normocephalic and atraumatic.     Right Ear: External ear normal.  Left Ear: External ear normal.     Nose: Nose normal.  Eyes:     Conjunctiva/sclera: Conjunctivae normal.  Cardiovascular:     Rate and Rhythm: Normal rate and regular rhythm.     Heart sounds: No murmur heard. Pulmonary:     Effort: No respiratory distress.     Breath sounds: No wheezing, rhonchi or rales.  Abdominal:     Palpations: Abdomen is soft.     Tenderness: There is no abdominal tenderness. There is no guarding or rebound.  Musculoskeletal:     Cervical back: Normal range of motion and neck supple.     Right lower leg: No edema.     Left lower leg: No edema.  Skin:    General: Skin is warm and dry.     Findings: No rash.  Neurological:      General: No focal deficit present.     Mental Status: She is alert. Mental status is at baseline.     Motor: No weakness.     Comments: Patient is not very compliant with neuroexam.  She does have bilateral grip strength but is not putting forth good effort.  She states that when she tries to pull it causes her arms to hurt.  She does lift her arms up bilaterally against gravity.  Psychiatric:        Attention and Perception: Attention normal.        Mood and Affect: Mood is depressed. Affect is flat.        Behavior: Behavior is withdrawn. Behavior is cooperative.        Thought Content: Thought content does not include suicidal ideation.     ED Results / Procedures / Treatments   Labs (all labs ordered are listed, but only abnormal results are displayed) Labs Reviewed  CBC WITH DIFFERENTIAL/PLATELET - Abnormal; Notable for the following components:      Result Value   WBC 11.8 (*)    Hemoglobin 10.5 (*)    HCT 35.8 (*)    MCV 72.5 (*)    MCH 21.3 (*)    MCHC 29.3 (*)    RDW 18.5 (*)    Lymphs Abs 4.6 (*)    All other components within normal limits  COMPREHENSIVE METABOLIC PANEL - Abnormal; Notable for the following components:   Glucose, Bld 110 (*)    AST 14 (*)    All other components within normal limits  LIPASE, BLOOD  URINALYSIS, ROUTINE W REFLEX MICROSCOPIC  I-STAT BETA HCG BLOOD, ED ([facility], [facility], [facility] ONLY)    ED ECG REPORT   Date: 12/27/2022  Rate: 95  Rhythm: normal sinus rhythm  QRS Axis: normal  Intervals: normal  ST/T Wave abnormalities: nonspecific T wave changes  Conduction Disutrbances:none  Narrative Interpretation:   Old EKG Reviewed: changes noted, slower today compared to 11/26/22  I have personally reviewed the EKG tracing and agree with the computerized printout as noted.   Radiology No results found.  Procedures Procedures    Medications Ordered in ED Medications  sodium chloride 0.9 % bolus 1,000 mL (has no administration in time range)   metoCLOPramide (REGLAN) injection 10 mg (has no administration in time range)  diphenhydrAMINE (BENADRYL) injection 25 mg (has no administration in time range)    ED Course/ Medical Decision Making/ A&P    Patient seen and examined. History obtained directly from patient.  Will evaluate headache and treat headache, will evaluate lightheadedness and treat with IV fluids.  Labs/EKG: Ordered CBC,  CMP, lipase, UA, pregnancy.  Also EKG and orthostatics ordered.  Imaging: Ordered CT head.  Medications/Fluids: Ordered: IV Reglan, IV Benadryl, IV fluid bolus.   Most recent vital signs reviewed and are as follows: BP (!) 150/98 (BP Location: Right Arm)   Pulse 98   Temp 97.9 F (36.6 C) (Oral)   Resp 16   Ht 5\' 7"  (1.702 m)   Wt 111.1 kg   SpO2 100%   BMI 38.36 kg/m   Initial impression: Chronic headache, lightheadedness.  8:42 PM RN reported that patient refused CT scan of the head and treatments and eloped, walking out under her own power.   When I saw her 1 month ago, she also refused additional testing and left AGAINST MEDICAL ADVICE.  I personally reviewed and interpreted lab work resulting to this point including: CBC with minimally elevated white blood cell count at 11.8, mild anemia at 10.5 hemoglobin otherwise unremarkable; CMP largely unremarkable with normal BUN and kidney function; lipase normal.                            Medical Decision Making Amount and/or Complexity of Data Reviewed Labs: ordered. Radiology: ordered.  Risk Prescription drug management.   In regards to the patient's headache, critical differentials were considered including subarachnoid hemorrhage, intracerebral hemorrhage, epidural/subdural hematoma, pituitary apoplexy, vertebral/carotid artery dissection, giant cell arteritis, central venous thrombosis, reversible cerebral vasoconstriction, acute angle closure glaucoma, idiopathic intracranial hypertension, bacterial meningitis, viral  encephalitis, carbon monoxide poisoning, posterior reversible encephalopathy syndrome, pre-eclampsia.   Reg flag symptoms related to these causes were considered including systemic symptoms (fever, weight loss), neurologic symptoms (confusion, mental status change, vision change, associated seizure), acute or sudden "thunderclap" onset, patient age 40 or older with new or progressive headache, patient of any age with first headache or change in headache pattern, pregnant or postpartum status, history of HIV or other immunocompromise, history of cancer, headache occurring with exertion, associated neck or shoulder pain, associated traumatic injury, concurrent use of anticoagulation, family history of spontaneous SAH, and concurrent drug use.    Other benign, more common causes of headache were considered including migraine, tension-type headache, cluster headache, referred pain from other cause such as sinus infection, dental pain, trigeminal neuralgia.   On exam, patient has a reassuring neuro exam including baseline mental status, no significant neck pain or meningeal signs, no signs of severe infection or fever.   She does have a somewhat flat affect, but does not seem to be responding to internal stimuli loose and eating.  No signs of self-harm.  Ideally, further workup would be completed today, however patient continues to refuse imaging of the head.  In regards to her lightheadedness, EKG without signs of prolonged QT, WPW, Brugada syndrome, heart block, hypertrophy or other problems.        Final Clinical Impression(s) / ED Diagnoses Final diagnoses:  Lightheadedness  Acute nonintractable headache, unspecified headache type    Rx / DC Orders ED Discharge Orders     None         Suann Larry 12/27/22 2046    Fransico Meadow, MD 12/28/22 [phone]

## 2022-12-27 NOTE — ED Triage Notes (Addendum)
Pt BIB EMS for weakness and dizziness for the past month, vomiting for the past week. C/o increased weakness and a headache today. Kids at home are sick. Has a hx of anxiety but says "she doesn't have it anymore". When asked about SI states "I hate my life but no suicidal thoughts" [facility], ambulatory.

## 2022-12-27 NOTE — ED Notes (Signed)
Attempted to start IV, got blood from IV and while trying to float IV into vein patient kept attempting to grab this nurses hand and pull IV out. Was not able to successfully secure IV.

## 2023-01-29 ENCOUNTER — Emergency Department (HOSPITAL_COMMUNITY)
Admission: EM | Admit: 2023-01-29 | Discharge: 2023-01-29 | Disposition: A | Discharge status: Home / Self Care | Payer: Medicaid Other | Attending: Emergency Medicine | Admitting: Emergency Medicine

## 2023-01-29 DIAGNOSIS — Z711 Person with feared health complaint in whom no diagnosis is made: Secondary | ICD-10-CM

## 2023-01-29 DIAGNOSIS — T590X1A Toxic effect of nitrogen oxides, accidental (unintentional), initial encounter: Secondary | ICD-10-CM | POA: Diagnosis present

## 2023-01-29 DIAGNOSIS — F32A Depression, unspecified: Secondary | ICD-10-CM | POA: Diagnosis not present

## 2023-01-29 DIAGNOSIS — R000 Tachycardia, unspecified: Secondary | ICD-10-CM | POA: Diagnosis not present

## 2023-01-29 DIAGNOSIS — Z91040 Latex allergy status: Secondary | ICD-10-CM | POA: Insufficient documentation

## 2023-01-29 LAB — RAPID URINE DRUG SCREEN, HOSP PERFORMED
Amphetamines: NOT DETECTED
Barbiturates: NOT DETECTED
Benzodiazepines: NOT DETECTED
Cocaine: NOT DETECTED
Opiates: NOT DETECTED
Tetrahydrocannabinol: NOT DETECTED

## 2023-01-29 LAB — CBG MONITORING, ED: Glucose-Capillary: 81 mg/dL (ref 70–99)
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
## 2023-01-29 NOTE — ED Notes (Signed)
Pt sitting up in bed, pt denies pain, pt refused oral temp, pt states that she is ready to go home, taxi voucher given, pt from dpt.

## 2023-01-29 NOTE — Discharge Instructions (Addendum)
Follow-up per recommendations of social work. °
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
## 2023-01-29 NOTE — ED Notes (Signed)
Called poison control, started case, per poison control monitor respiratory status and mental status, per poison control no interventions are necessary and monitor until pt is at base line.  Suggest opening windows in home to air out if necessary.

## 2023-02-01 ENCOUNTER — Ambulatory Visit: Payer: Medicaid Other

## 2023-02-21 ENCOUNTER — Other Ambulatory Visit: Payer: Self-pay

## 2023-02-21 ENCOUNTER — Encounter (HOSPITAL_COMMUNITY): Payer: Self-pay

## 2023-02-21 ENCOUNTER — Emergency Department (HOSPITAL_COMMUNITY)
Admission: EM | Admit: 2023-02-21 | Discharge: 2023-02-21 | Discharge status: Against Medical Advice | Payer: Medicaid Other | Attending: Medical | Admitting: Medical

## 2023-02-21 DIAGNOSIS — K625 Hemorrhage of anus and rectum: Secondary | ICD-10-CM | POA: Diagnosis not present

## 2023-02-21 DIAGNOSIS — M545 Low back pain, unspecified: Secondary | ICD-10-CM | POA: Insufficient documentation

## 2023-02-21 DIAGNOSIS — N938 Other specified abnormal uterine and vaginal bleeding: Secondary | ICD-10-CM | POA: Diagnosis not present

## 2023-02-21 DIAGNOSIS — Z5321 Procedure and treatment not carried out due to patient leaving prior to being seen by health care provider: Secondary | ICD-10-CM | POA: Diagnosis not present

## 2023-02-21 DIAGNOSIS — R109 Unspecified abdominal pain: Secondary | ICD-10-CM | POA: Insufficient documentation

## 2023-02-21 NOTE — ED Triage Notes (Signed)
Pt reporting abd pain and vag bleeding X 3days with clots. Negative preg at home. Does have the sensation that she needs to push. Associated low back pain and vaginal odor. Possible rectal bleeding as well

## 2023-02-21 NOTE — ED Provider Triage Note (Signed)
Emergency Medicine Provider Triage Evaluation Note  Melinda Jimenez , a 37 y.o. female  was evaluated in triage.  Pt complains of allover fatigue, headache, vaginal pain, fishy smell down there, and blood clots from her vagina.  Denies any kind of fever, chills, nausea, vomiting.  Review of Systems  Positive: Vaginal pain, discharge Negative: Fever  Physical Exam  BP (!) 149/98 (BP Location: Right Arm)   Pulse 95   Temp 97.9 F (36.6 C) (Oral)   Resp 16   Ht 5\' 7"  (1.702 m)   SpO2 100%   BMI 32.91 kg/m  Gen:   Awake, no distress   Resp:  Normal effort  MSK:   Moves extremities without difficulty  Other:  No neurodeficits  Medical Decision Making  Medically screening exam initiated at 7:55 PM.  Appropriate orders placed.  Melinda Jimenez was informed that the remainder of the evaluation will be completed by another provider, this initial triage assessment does not replace that evaluation, and the importance of remaining in the ED until their evaluation is complete.     Pete Pelt, [place] 02/21/23 1956

## 2023-02-21 NOTE — ED Notes (Signed)
Pt called for blood draw, no answer x 2. Assumed LWBS

## 2023-05-23 ENCOUNTER — Emergency Department (HOSPITAL_COMMUNITY)
Admission: EM | Admit: 2023-05-23 | Discharge: 2023-05-23 | Discharge status: Against Medical Advice | Payer: MEDICAID | Attending: Emergency Medicine | Admitting: Emergency Medicine

## 2023-05-23 ENCOUNTER — Encounter (HOSPITAL_COMMUNITY): Payer: Self-pay

## 2023-05-23 DIAGNOSIS — R0602 Shortness of breath: Secondary | ICD-10-CM | POA: Diagnosis not present

## 2023-05-23 DIAGNOSIS — R110 Nausea: Secondary | ICD-10-CM | POA: Diagnosis not present

## 2023-05-23 DIAGNOSIS — R109 Unspecified abdominal pain: Secondary | ICD-10-CM | POA: Diagnosis present

## 2023-05-23 DIAGNOSIS — Z5321 Procedure and treatment not carried out due to patient leaving prior to being seen by health care provider: Secondary | ICD-10-CM | POA: Insufficient documentation

## 2023-05-23 NOTE — ED Notes (Addendum)
Patient declined EKG and blood work. She was escorted to the peds area due to her son being seen for a seizure.Peds Charge RN was made aware that the patient in currently checked in to be seen.

## 2023-05-23 NOTE — ED Notes (Signed)
Patient now states her stomach cramps are no longer bothering her. She reports a headache 10/10.

## 2023-05-23 NOTE — ED Notes (Signed)
Pt called multiple times no answer 

## 2023-05-23 NOTE — ED Triage Notes (Signed)
Patient reports abdominal cramps, nausea, and shortness of breath.

## 2023-06-06 ENCOUNTER — Other Ambulatory Visit: Payer: Self-pay | Admitting: Obstetrics & Gynecology

## 2023-06-06 ENCOUNTER — Other Ambulatory Visit: Payer: Self-pay | Admitting: Obstetrics and Gynecology

## 2023-06-06 DIAGNOSIS — A749 Chlamydial infection, unspecified: Secondary | ICD-10-CM

## 2023-08-06 ENCOUNTER — Emergency Department (HOSPITAL_COMMUNITY)
Admission: EM | Admit: 2023-08-06 | Discharge: 2023-08-07 | Discharge status: Against Medical Advice | Payer: MEDICAID | Attending: Emergency Medicine | Admitting: Emergency Medicine

## 2023-08-06 ENCOUNTER — Encounter (HOSPITAL_COMMUNITY): Payer: Self-pay

## 2023-08-06 ENCOUNTER — Other Ambulatory Visit: Payer: Self-pay

## 2023-08-06 ENCOUNTER — Emergency Department (HOSPITAL_COMMUNITY): Payer: MEDICAID

## 2023-08-06 DIAGNOSIS — R42 Dizziness and giddiness: Secondary | ICD-10-CM | POA: Diagnosis not present

## 2023-08-06 DIAGNOSIS — H538 Other visual disturbances: Secondary | ICD-10-CM | POA: Diagnosis not present

## 2023-08-06 DIAGNOSIS — Z5321 Procedure and treatment not carried out due to patient leaving prior to being seen by health care provider: Secondary | ICD-10-CM | POA: Diagnosis not present

## 2023-08-06 DIAGNOSIS — R0602 Shortness of breath: Secondary | ICD-10-CM | POA: Insufficient documentation

## 2023-08-06 LAB — BASIC METABOLIC PANEL
Anion gap: 9 (ref 5–15)
BUN: 16 mg/dL (ref 6–20)
CO2: 23 mmol/L (ref 22–32)
Calcium: 9.1 mg/dL (ref 8.9–10.3)
Chloride: 106 mmol/L (ref 98–111)
Creatinine, Ser: 0.68 mg/dL (ref 0.44–1.00)
GFR, Estimated: >60 mL/min (ref >60)
Glucose, Bld: 92 mg/dL (ref 70–99)
Potassium: 3.4 mmol/L — ABNORMAL LOW (ref 3.5–5.1)
Sodium: 138 mmol/L (ref 135–145)

## 2023-08-06 LAB — CBC
HCT: 34.1 % — ABNORMAL LOW (ref 36.0–46.0)
Hemoglobin: 10 g/dL — ABNORMAL LOW (ref 12.0–15.0)
MCH: 21 pg — ABNORMAL LOW (ref 26.0–34.0)
MCHC: 29.3 g/dL — ABNORMAL LOW (ref 30.0–36.0)
MCV: 71.5 fL — ABNORMAL LOW (ref 80.0–100.0)
Platelets: 348 10*3/uL (ref 150–400)
RBC: 4.77 MIL/uL (ref 3.87–5.11)
RDW: 18.1 % — ABNORMAL HIGH (ref 11.5–15.5)
WBC: 11 10*3/uL — ABNORMAL HIGH (ref 4.0–10.5)
nRBC: 0 % (ref 0.0–0.2)

## 2023-08-06 LAB — HCG, SERUM, QUALITATIVE: Preg, Serum: NEGATIVE

## 2023-08-06 LAB — TROPONIN I (HIGH SENSITIVITY): Troponin I (High Sensitivity): 10 ng/L (ref <18)

## 2023-08-06 MED ORDER — ALBUTEROL SULFATE HFA 108 (90 BASE) MCG/ACT IN AERS: 2.0000 | INHALATION_SPRAY | RESPIRATORY_TRACT | Status: DC | PRN

## 2023-08-06 NOTE — ED Triage Notes (Signed)
Pt complaining of SOB, dizziness, and blurred vision constant for 1 week. Pt currently with children who are being seen for separate problem.

## 2023-08-06 NOTE — ED Notes (Signed)
Pt refused second trop and other labs at this time

## 2023-08-06 NOTE — ED Notes (Signed)
Pt currently in peds department with her children. Awaiting for room assignment

## 2023-08-07 NOTE — ED Notes (Signed)
Pt refusing repeat troponin

## 2023-08-07 NOTE — ED Notes (Signed)
Pt eloped without signing AMA papers.

## 2023-08-07 NOTE — ED Notes (Signed)
Pt in located in peds, unable to reassess vitals at this time.

## 2023-08-15 ENCOUNTER — Other Ambulatory Visit: Payer: Self-pay | Admitting: Obstetrics and Gynecology

## 2023-08-15 ENCOUNTER — Other Ambulatory Visit: Payer: Self-pay | Admitting: Obstetrics & Gynecology

## 2023-08-15 DIAGNOSIS — A749 Chlamydial infection, unspecified: Secondary | ICD-10-CM

## 2023-08-15 MED ORDER — METRONIDAZOLE 500 MG PO TABS: 500.0000 mg | ORAL_TABLET | Freq: Two times a day (BID) | ORAL | 0 refills | Status: DC

## 2023-08-26 ENCOUNTER — Telehealth: Payer: MEDICAID | Admitting: Family Medicine

## 2023-08-26 NOTE — Progress Notes (Signed)
The patient no-showed for appointment despite this provider sending direct link, reaching out via phone with no response and waiting for at least 10 minutes from appointment time for patient to join. They will be marked as a NS for this appointment/time.    M , NP    

## 2023-09-15 ENCOUNTER — Telehealth: Payer: MEDICAID | Admitting: Nurse Practitioner

## 2023-09-15 DIAGNOSIS — N760 Acute vaginitis: Secondary | ICD-10-CM

## 2023-09-15 DIAGNOSIS — B9689 Other specified bacterial agents as the cause of diseases classified elsewhere: Secondary | ICD-10-CM

## 2023-09-15 DIAGNOSIS — T3695XA Adverse effect of unspecified systemic antibiotic, initial encounter: Secondary | ICD-10-CM | POA: Diagnosis not present

## 2023-09-15 DIAGNOSIS — B379 Candidiasis, unspecified: Secondary | ICD-10-CM | POA: Diagnosis not present

## 2023-09-15 MED ORDER — METRONIDAZOLE 0.75 % VA GEL: 1.0000 | Freq: Two times a day (BID) | VAGINAL | 0 refills | Status: DC

## 2023-09-15 MED ORDER — FLUCONAZOLE 150 MG PO TABS: 150.0000 mg | ORAL_TABLET | ORAL | 0 refills | Status: DC

## 2023-09-15 NOTE — Progress Notes (Signed)
Virtual Visit Consent   Melinda Jimenez, you are scheduled for a virtual visit with a [facility] provider today. Just as with appointments in the office, your consent must be obtained to participate. Your consent will be active for this visit and any virtual visit you may have with one of our providers in the next 365 days. If you have a MyChart account, a copy of this consent can be sent to you electronically.  As this is a virtual visit, video technology does not allow for your provider to perform a traditional examination. This may limit your provider's ability to fully assess your condition. If your provider identifies any concerns that need to be evaluated in person or the need to arrange testing (such as labs, EKG, etc.), we will make arrangements to do so. Although advances in technology are sophisticated, we cannot ensure that it will always work on either your end or our end. If the connection with a video visit is poor, the visit may have to be switched to a telephone visit. With either a video or telephone visit, we are not always able to ensure that we have a secure connection.  By engaging in this virtual visit, you consent to the provision of healthcare and authorize for your insurance to be billed (if applicable) for the services provided during this visit. Depending on your insurance coverage, you may receive a charge related to this service.  I need to obtain your verbal consent now. Are you willing to proceed with your visit today? AJUNI PAGANI has provided verbal consent on 09/15/2023 for a virtual visit (video or telephone). Claiborne Rigg, NP  Date: 09/15/2023 3:33 PM  Virtual Visit via Video Note   I, Claiborne Rigg, connected with  Melinda Jimenez  ([id number], 17-Feb-1986) on 09/15/23 at  3:30 PM EST by a video-enabled telemedicine application and verified that I am speaking with the correct person using two identifiers.  Location: Patient: Virtual Visit Location Patient:  Home Provider: Virtual Visit Location Provider: Home Office   I discussed the limitations of evaluation and management by telemedicine and the availability of in person appointments. The patient expressed understanding and agreed to proceed.    History of Present Illness: Melinda Jimenez is a 37 y.o. who identifies as a female who was assigned female at birth, and is being seen today for BV.  Ms. Vandermolen states she has been experiencing a strong vaginal odor. She believes she has BV based on current symptoms being similar to past symptoms. She would like to know if there is a pill we can prescribe her to cover all STDs today.  She is also requesting a refill of xanax and percocet.   Experiencing unilateral headaches not really controlled with ibuprofen. Does not check her blood pressures at home.      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   Intrauterine pregnancy 03/22/2020   H/O: cesarean section 09/27/2016   GERD without esophagitis 01/20/2013   CARPAL TUNNEL SYNDROME 06/02/2010   Vitamin D deficiency 04/19/2010   Herpes genitalis in women 04/18/2010   Goiter, unspecified 04/18/2010    Allergies:  Allergies  Allergen Reactions  Latex Itching   Zithromax [Azithromycin] Hives and Nausea And Vomiting   Zofran [Ondansetron] Nausea Only   Medications:  Current Outpatient Medications:    fluconazole (DIFLUCAN) 150 MG tablet, Take 1 tablet (150 mg total) by mouth every 3 (three) days., Disp: 2 tablet, Rfl: 0   metroNIDAZOLE (METROGEL) 0.75 % vaginal gel, Place 1 Applicatorful vaginally 2 (two) times daily., Disp: 70 g, Rfl: 0   acetaminophen  (TYLENOL) 325 MG tablet, Take 2 tablets (650 mg total) by mouth every 4 (four) hours as needed for mild pain, moderate pain, fever or headache (pain >4, fever >101.5*F)., Disp: 30 tablet, Rfl: 0   doxycycline (VIBRAMYCIN) 100 MG capsule, Take 1 capsule (100 mg total) by mouth 2 (two) times daily., Disp: 14 capsule, Rfl: 0   metoCLOPramide (REGLAN) 10 MG tablet, Take 1 tablet (10 mg total) by mouth every 8 (eight) hours as needed for nausea., Disp: 30 tablet, Rfl: 0   misoprostol (CYTOTEC) 200 MCG tablet, Take 4 tablets (800 mcg total) by mouth daily as needed (take second dose in 24 hours if no heavy bleeding)., Disp: 8 tablet, Rfl: 0  Observations/Objective: Patient is well-developed, well-nourished in no acute distress.  Resting comfortably at home.  Head is normocephalic, atraumatic.  No labored breathing.  Speech is clear and coherent with logical content.  Patient is alert and oriented at baseline.    Assessment and Plan: 1. BV (bacterial vaginosis) (Primary) - metroNIDAZOLE (METROGEL) 0.75 % vaginal gel; Place 1 Applicatorful vaginally 2 (two) times daily.  Dispense: 70 g; Refill: 0  2. Antibiotic-induced yeast infection - fluconazole (DIFLUCAN) 150 MG tablet; Take 1 tablet (150 mg total) by mouth every 3 (three) days.  Dispense: 2 tablet; Refill: 0  We do not prescribe pain medications or benzodiazepines.   Needs to be formally tested and treated if needed though health dept or PCP.   Call [facility] in the am to schedule to re establish care.   Follow Up Instructions: I discussed the assessment and treatment plan with the patient. The patient was provided an opportunity to ask questions and all were answered. The patient agreed with the plan and demonstrated an understanding of the instructions.  A copy of instructions were sent to the patient via MyChart unless otherwise noted below.    The patient was advised to call back or seek an in-person evaluation if the  symptoms worsen or if the condition fails to improve as anticipated.    Claiborne Rigg, NP

## 2023-09-15 NOTE — Patient Instructions (Signed)
Melinda Jimenez, thank you for joining Claiborne Rigg, NP for today's virtual visit.  While this provider is not your primary care provider (PCP), if your PCP is located in our provider database this encounter information will be shared with them immediately following your visit.   A [facility] MyChart account gives you access to today's visit and all your visits, tests, and labs performed at [facility] " click here if you don't have a [facility] MyChart account or go to [email or web address]  Consent: (Patient) Melinda Jimenez provided verbal consent for this virtual visit at the beginning of the encounter.  Current Medications:  Current Outpatient Medications:    fluconazole (DIFLUCAN) 150 MG tablet, Take 1 tablet (150 mg total) by mouth every 3 (three) days., Disp: 2 tablet, Rfl: 0   metroNIDAZOLE (METROGEL) 0.75 % vaginal gel, Place 1 Applicatorful vaginally 2 (two) times daily., Disp: 70 g, Rfl: 0   acetaminophen (TYLENOL) 325 MG tablet, Take 2 tablets (650 mg total) by mouth every 4 (four) hours as needed for mild pain, moderate pain, fever or headache (pain >4, fever >101.5*F)., Disp: 30 tablet, Rfl: 0   doxycycline (VIBRAMYCIN) 100 MG capsule, Take 1 capsule (100 mg total) by mouth 2 (two) times daily., Disp: 14 capsule, Rfl: 0   metoCLOPramide (REGLAN) 10 MG tablet, Take 1 tablet (10 mg total) by mouth every 8 (eight) hours as needed for nausea., Disp: 30 tablet, Rfl: 0   misoprostol (CYTOTEC) 200 MCG tablet, Take 4 tablets (800 mcg total) by mouth daily as needed (take second dose in 24 hours if no heavy bleeding)., Disp: 8 tablet, Rfl: 0   Medications ordered in this encounter:  Meds ordered this encounter  Medications   fluconazole (DIFLUCAN) 150 MG tablet    Sig: Take 1 tablet (150 mg total) by mouth every 3 (three) days.    Dispense:  2 tablet    Refill:  0    Supervising Provider:   Merrilee Jansky [[id number]]   metroNIDAZOLE (METROGEL) 0.75 % vaginal gel     Sig: Place 1 Applicatorful vaginally 2 (two) times daily.    Dispense:  70 g    Refill:  0    Supervising Provider:   Merrilee Jansky [[id number]]     *If you need refills on other medications prior to your next appointment, please contact your pharmacy*  Follow-Up: Call back or seek an in-person evaluation if the symptoms worsen or if the condition fails to improve as anticipated.  [facility] [phone]  Other Instructions We do not prescribe pain medications or benzodiazepines.   Needs to be formally tested and treated if needed though health dept or PCP.   Call [facility] in the am to schedule to re establish care.    If you have been instructed to have an in-person evaluation today at a local Urgent Care facility, please use the link below. It will take you to a list of all of our available [facility], including address, phone number and hours of operation. Please do not delay care.  [facility]  If you or a family member do not have a primary care provider, use the link below to schedule a visit and establish care. When you choose a [facility] primary care physician or advanced practice provider, you gain a long-term partner in health. Find a Primary Care Provider  Learn more about 's in-office and virtual care options:  [facility] Get Care Now

## 2023-09-16 ENCOUNTER — Emergency Department (HOSPITAL_COMMUNITY): Admission: EM | Admit: 2023-09-16 | Discharge: 2023-09-17 | Discharge status: Against Medical Advice | Payer: MEDICAID

## 2023-09-16 NOTE — ED Notes (Signed)
Pt did not answer to call  for triage

## 2023-11-01 ENCOUNTER — Other Ambulatory Visit (HOSPITAL_COMMUNITY)
Admission: RE | Admit: 2023-11-01 | Discharge: 2023-11-01 | Disposition: A | Discharge status: Home / Self Care | Payer: MEDICAID | Source: Ambulatory Visit | Attending: Obstetrics and Gynecology | Admitting: Obstetrics and Gynecology

## 2023-11-01 ENCOUNTER — Ambulatory Visit: Payer: MEDICAID

## 2023-11-01 DIAGNOSIS — Z113 Encounter for screening for infections with a predominantly sexual mode of transmission: Secondary | ICD-10-CM

## 2023-11-01 DIAGNOSIS — N898 Other specified noninflammatory disorders of vagina: Secondary | ICD-10-CM | POA: Insufficient documentation

## 2023-11-01 DIAGNOSIS — N390 Urinary tract infection, site not specified: Secondary | ICD-10-CM | POA: Insufficient documentation

## 2023-11-01 DIAGNOSIS — Z3202 Encounter for pregnancy test, result negative: Secondary | ICD-10-CM | POA: Diagnosis not present

## 2023-11-01 LAB — POCT URINALYSIS DIPSTICK
Glucose, UA: NEGATIVE
Leukocytes, UA: NEGATIVE
Protein, UA: NEGATIVE

## 2023-11-01 LAB — POCT URINE PREGNANCY: Preg Test, Ur: NEGATIVE

## 2023-11-01 MED ORDER — NITROFURANTOIN MONOHYD MACRO 100 MG PO CAPS: 100.0000 mg | ORAL_CAPSULE | Freq: Two times a day (BID) | ORAL | 0 refills | Status: DC

## 2023-11-01 NOTE — Addendum Note (Signed)
Addended by: Jyl Heinz on: 11/01/2023 11:26 AM   Modules accepted: Orders

## 2023-11-01 NOTE — Progress Notes (Signed)
SUBJECTIVE:  38 y.o. female complains of  vaginal Odor for several day(s) and dysuria Denies abnormal vaginal bleeding or significant pelvic pain or fever.Denies history of known exposure to STD.  No LMP recorded.  OBJECTIVE:  She appears alert, well appearing, in no apparent distress   ASSESSMENT:  Vaginal Discharge  Dysuria Vaginal Odor   PLAN:  GC, chlamydia, trichomonas, BVAG, CVAG probe sent to lab. Treatment: Pt requesting antibiotic for UTI, therefore Macrobid sent in per protocol  ROV prn if symptoms persist or worsen.

## 2023-11-02 LAB — RPR+HBSAG+HCVAB+...
HIV Screen 4th Generation wRfx: NONREACTIVE
Hep C Virus Ab: NONREACTIVE
Hepatitis B Surface Ag: NEGATIVE
RPR Ser Ql: NONREACTIVE

## 2023-11-03 LAB — URINE CULTURE

## 2023-11-04 LAB — CERVICOVAGINAL ANCILLARY ONLY
Chlamydia: NEGATIVE
Comment: NEGATIVE
Comment: NEGATIVE
Comment: NORMAL
Neisseria Gonorrhea: NEGATIVE
Trichomonas: NEGATIVE

## 2023-11-30 ENCOUNTER — Other Ambulatory Visit (INDEPENDENT_AMBULATORY_CARE_PROVIDER_SITE_OTHER): Payer: Self-pay | Admitting: Nurse Practitioner

## 2023-11-30 DIAGNOSIS — N760 Acute vaginitis: Secondary | ICD-10-CM

## 2023-12-28 ENCOUNTER — Other Ambulatory Visit: Payer: Self-pay

## 2023-12-28 ENCOUNTER — Encounter (HOSPITAL_COMMUNITY): Payer: Self-pay

## 2023-12-28 ENCOUNTER — Emergency Department (HOSPITAL_COMMUNITY)
Admission: EM | Admit: 2023-12-28 | Discharge: 2023-12-28 | Discharge status: Against Medical Advice | Payer: MEDICAID | Attending: Emergency Medicine | Admitting: Emergency Medicine

## 2023-12-28 DIAGNOSIS — R1033 Periumbilical pain: Secondary | ICD-10-CM | POA: Insufficient documentation

## 2023-12-28 DIAGNOSIS — Z91040 Latex allergy status: Secondary | ICD-10-CM | POA: Diagnosis not present

## 2023-12-28 DIAGNOSIS — K5900 Constipation, unspecified: Secondary | ICD-10-CM | POA: Insufficient documentation

## 2023-12-28 DIAGNOSIS — R112 Nausea with vomiting, unspecified: Secondary | ICD-10-CM

## 2023-12-28 LAB — URINALYSIS, ROUTINE W REFLEX MICROSCOPIC
Bilirubin Urine: NEGATIVE
Glucose, UA: NEGATIVE mg/dL
Ketones, ur: NEGATIVE mg/dL
Leukocytes,Ua: NEGATIVE
Nitrite: NEGATIVE
Protein, ur: NEGATIVE mg/dL
Specific Gravity, Urine: 1.01 (ref 1.005–1.030)
pH: 6 (ref 5.0–8.0)

## 2023-12-28 LAB — CBC WITH DIFFERENTIAL/PLATELET
Abs Immature Granulocytes: 0.03 10*3/uL (ref 0.00–0.07)
Basophils Absolute: 0 10*3/uL (ref 0.0–0.1)
Basophils Relative: 0 %
Eosinophils Absolute: 0.1 10*3/uL (ref 0.0–0.5)
Eosinophils Relative: 1 %
HCT: 36.9 % (ref 36.0–46.0)
Hemoglobin: 10.9 g/dL — ABNORMAL LOW (ref 12.0–15.0)
Immature Granulocytes: 0 %
Lymphocytes Relative: 40 %
Lymphs Abs: 3.9 10*3/uL (ref 0.7–4.0)
MCH: 20.7 pg — ABNORMAL LOW (ref 26.0–34.0)
MCHC: 29.5 g/dL — ABNORMAL LOW (ref 30.0–36.0)
MCV: 70 fL — ABNORMAL LOW (ref 80.0–100.0)
Monocytes Absolute: 0.6 10*3/uL (ref 0.1–1.0)
Monocytes Relative: 7 %
Neutro Abs: 5 10*3/uL (ref 1.7–7.7)
Neutrophils Relative %: 52 %
Platelets: 391 10*3/uL (ref 150–400)
RBC: 5.27 MIL/uL — ABNORMAL HIGH (ref 3.87–5.11)
RDW: 18.2 % — ABNORMAL HIGH (ref 11.5–15.5)
WBC: 9.7 10*3/uL (ref 4.0–10.5)
nRBC: 0 % (ref 0.0–0.2)

## 2023-12-28 LAB — COMPREHENSIVE METABOLIC PANEL WITH GFR
ALT: 11 U/L (ref 0–44)
AST: 15 U/L (ref 15–41)
Albumin: 3.5 g/dL (ref 3.5–5.0)
Alkaline Phosphatase: 61 U/L (ref 38–126)
Anion gap: 9 (ref 5–15)
BUN: 10 mg/dL (ref 6–20)
CO2: 22 mmol/L (ref 22–32)
Calcium: 9.2 mg/dL (ref 8.9–10.3)
Chloride: 105 mmol/L (ref 98–111)
Creatinine, Ser: 0.68 mg/dL (ref 0.44–1.00)
GFR, Estimated: >60 mL/min (ref >60)
Glucose, Bld: 86 mg/dL (ref 70–99)
Potassium: 3.9 mmol/L (ref 3.5–5.1)
Sodium: 136 mmol/L (ref 135–145)
Total Bilirubin: 0.6 mg/dL (ref 0.0–1.2)
Total Protein: 8.2 g/dL — ABNORMAL HIGH (ref 6.5–8.1)

## 2023-12-28 LAB — LIPASE, BLOOD: Lipase: 24 U/L (ref 11–51)

## 2023-12-28 LAB — PREGNANCY, URINE: Preg Test, Ur: NEGATIVE

## 2023-12-28 MED ORDER — ONDANSETRON 4 MG PO TBDP
4.0000 mg | ORAL_TABLET | Freq: Once | ORAL | Status: DC
  Filled 2023-12-28: qty 1

## 2023-12-28 MED ORDER — SODIUM CHLORIDE 0.9 % IV SOLN
25.0000 mg | Freq: Once | INTRAVENOUS | Status: AC
  Administered 2023-12-28: 25 mg via INTRAVENOUS
  Filled 2023-12-28: qty 25

## 2023-12-28 MED ORDER — ONDANSETRON HCL 4 MG/2ML IJ SOLN
4.0000 mg | Freq: Once | INTRAMUSCULAR | Status: DC
Start: 2023-12-28 — End: 2023-12-28

## 2023-12-28 NOTE — ED Notes (Signed)
 Patient first stated she does not want an IV, then she stated she wants an IV, then she stated that the tourniquet is leaving a bruise on her, this nurse tried multiple times to start an IV and finally was able to obtain and IV in her left AC.

## 2023-12-28 NOTE — ED Triage Notes (Signed)
 Pt coming in from home . Pt reports having stomach pains x 2 weeks pt is having severe abdominal pain  today constipation x 5 days and vomiting. Pt has not vomited with ems .

## 2023-12-28 NOTE — ED Provider Notes (Cosign Needed)
 [place] EMERGENCY DEPARTMENT AT [facility] Provider Note   CSN: [id number] Arrival date & time: 12/28/23  1339     History  Chief Complaint  Patient presents with   Abdominal Pain   Constipation   Emesis    Melinda Jimenez is a 38 y.o. female.  Patient with no past surgical history presents to the emergency department today for evaluation of generalized abdominal pain, nausea, vomiting, diarrhea, constipation ongoing over the past 2 weeks.  Patient reports pain that is worse above her umbilicus.  She reports daily vomiting.  She reports having large amounts of stool including watery stool at times but then constipation.  Typically when she gets constipated she starts vomiting.  She denies fever.  No chest pain or shortness of breath.  She reports some discomfort with urination.  Patient denies alcohol use, heavy NSAID use.  She reports the symptoms are not typical and she has not had these in the past.       Home Medications Prior to Admission medications   Medication Sig Start Date End Date Taking? Authorizing Provider  acetaminophen (TYLENOL) 325 MG tablet Take 2 tablets (650 mg total) by mouth every 4 (four) hours as needed for mild pain, moderate pain, fever or headache (pain >4, fever >101.5*F). Patient not taking: Reported on 11/01/2023 10/12/20   Shirlean Mylar, MD  doxycycline (VIBRAMYCIN) 100 MG capsule Take 1 capsule (100 mg total) by mouth 2 (two) times daily. Patient not taking: Reported on 11/01/2023 07/25/21   Adam Phenix, MD  fluconazole (DIFLUCAN) 150 MG tablet Take 1 tablet (150 mg total) by mouth every 3 (three) days. Patient not taking: Reported on 11/01/2023 09/15/23   Claiborne Rigg, NP  metoCLOPramide (REGLAN) 10 MG tablet Take 1 tablet (10 mg total) by mouth every 8 (eight) hours as needed for nausea. Patient not taking: Reported on 11/01/2023 07/21/21   Judeth Horn, NP  metroNIDAZOLE (METROGEL) 0.75 % vaginal gel Place 1 Applicatorful  vaginally 2 (two) times daily. Patient not taking: Reported on 11/01/2023 09/15/23   Claiborne Rigg, NP  misoprostol (CYTOTEC) 200 MCG tablet Take 4 tablets (800 mcg total) by mouth daily as needed (take second dose in 24 hours if no heavy bleeding). Patient not taking: Reported on 11/01/2023 07/21/21   Judeth Horn, NP  nitrofurantoin, macrocrystal-monohydrate, (MACROBID) 100 MG capsule Take 1 capsule (100 mg total) by mouth 2 (two) times daily. 11/01/23   Constant, Peggy, MD  fluticasone (FLONASE) 50 MCG/ACT nasal spray Place 1 spray into both nostrils daily. Use for 2-3 weeks to help with the nasal congestion and ear pain Patient not taking: Reported on 01/21/2020 01/20/20 02/11/20  Gwyneth Sprout, MD  sertraline (ZOLOFT) 25 MG tablet Take one tablet daily. If tolerating, increase to two tablets in one week. Patient not taking: Reported on 01/21/2020 01/18/20 02/11/20  Arvilla Market, MD  zolpidem (AMBIEN) 5 MG tablet Take 1 tablet (5 mg total) by mouth at bedtime as needed for sleep. Patient not taking: Reported on 01/21/2020 01/20/20 02/11/20  Gwyneth Sprout, MD      Allergies    Latex, Zithromax [azithromycin], and Zofran [ondansetron]    Review of Systems   Review of Systems  Physical Exam Updated Vital Signs BP (!) 152/94 (BP Location: Right Wrist)   Pulse 90   Temp 98.4 F (36.9 C) (Oral)   Resp 18   Ht 5\' 7"  (1.702 m)   Wt 99.8 kg   LMP 11/25/2023   SpO2  98%   BMI 34.46 kg/m   Physical Exam Vitals and nursing note reviewed.  Constitutional:      General: She is not in acute distress.    Appearance: She is well-developed.  HENT:     Head: Normocephalic and atraumatic.     Right Ear: External ear normal.     Left Ear: External ear normal.     Nose: Nose normal.  Eyes:     Conjunctiva/sclera: Conjunctivae normal.  Cardiovascular:     Rate and Rhythm: Normal rate and regular rhythm.     Heart sounds: No murmur heard. Pulmonary:     Effort: No  respiratory distress.     Breath sounds: No wheezing, rhonchi or rales.  Abdominal:     Palpations: Abdomen is soft.     Tenderness: There is abdominal tenderness in the epigastric area and periumbilical area. There is no guarding or rebound. Negative signs include Murphy's sign and McBurney's sign.  Musculoskeletal:     Cervical back: Normal range of motion and neck supple.     Right lower leg: No edema.     Left lower leg: No edema.  Skin:    General: Skin is warm and dry.     Findings: No rash.  Neurological:     General: No focal deficit present.     Mental Status: She is alert. Mental status is at baseline.     Motor: No weakness.  Psychiatric:        Mood and Affect: Mood normal. Affect is flat.     ED Results / Procedures / Treatments   Labs (all labs ordered are listed, but only abnormal results are displayed) Labs Reviewed  CBC WITH DIFFERENTIAL/PLATELET  COMPREHENSIVE METABOLIC PANEL WITH GFR  LIPASE, BLOOD  URINALYSIS, ROUTINE W REFLEX MICROSCOPIC  HCG, SERUM, QUALITATIVE    EKG None  Radiology No results found.  Procedures Procedures    Medications Ordered in ED Medications  promethazine (PHENERGAN) 25 mg in sodium chloride 0.9 % 50 mL IVPB (has no administration in time range)    ED Course/ Medical Decision Making/ A&P Clinical Course as of 12/28/23 1525  Sat Dec 28, 2023  1519 2 weeks abd pain, epigastric, N/V only with constipation, otherwise diarrhea - labwork - po challenge [HG]    Clinical Course User Index [HG] Renella Cunas, MD    Patient seen and examined. History obtained directly from patient.   Labs/EKG: Ordered CBC, CMP, lipase, UA, pregnancy  Imaging: Offered CT imaging however patient is uncertain about this.  Medications/Fluids: Ordered Zofran for symptom control.  Patient declines pain medication.  Most recent vital signs reviewed and are as follows: BP (!) 152/94 (BP Location: Right Wrist)   Pulse 90   Temp 98.4 F  (36.9 C) (Oral)   Resp 18   Ht 5\' 7"  (1.702 m)   Wt 99.8 kg   LMP 11/25/2023   SpO2 98%   BMI 34.46 kg/m   Initial impression: Upper abdominal pain.  3:02 PM Per RN, pt refused zofran. She has IV. Phenergan ordered.   Signout to Dr. Clemon Chambers at shift change. Plan: f/u labs, recheck symptoms.                                 Medical Decision Making Amount and/or Complexity of Data Reviewed Labs: ordered.  Risk Prescription drug management.   For this patient's complaint of abdominal pain, the following conditions  were considered on the differential diagnosis: gastritis/PUD, enteritis/duodenitis, appendicitis, cholelithiasis/cholecystitis, cholangitis, pancreatitis, ruptured viscus, colitis, diverticulitis, small/large bowel obstruction, proctitis, cystitis, pyelonephritis, ureteral colic, aortic dissection, aortic aneurysm. In women, ectopic pregnancy, pelvic inflammatory disease, ovarian cysts, and tubo-ovarian abscess were also considered. Atypical chest etiologies were also considered including ACS, PE, and pneumonia.  Pending completion of evaluation.         Final Clinical Impression(s) / ED Diagnoses Final diagnoses:  Periumbilical abdominal pain  Nausea and vomiting, unspecified vomiting type  Constipation, unspecified constipation type    Rx / DC Orders ED Discharge Orders     None         Renne Crigler, PA-C 12/28/23 1526

## 2023-12-28 NOTE — ED Notes (Signed)
 Edp called lab to add on hcg level

## 2023-12-28 NOTE — ED Notes (Signed)
 Patient tolerating crackers and peanut butter well, patient has not had one nausea or vomiting episode at this time.

## 2023-12-28 NOTE — ED Notes (Signed)
 Patient ambulated to the bathroom with a steady gait without any complications

## 2023-12-28 NOTE — ED Provider Notes (Signed)
  Physical Exam  BP (!) 152/94 (BP Location: Right Wrist)   Pulse 90   Temp 98.4 F (36.9 C) (Oral)   Resp 18   Ht 5\' 7"  (1.702 m)   Wt 99.8 kg   LMP 11/25/2023   SpO2 98%   BMI 34.46 kg/m   Physical Exam  Procedures  Procedures  ED Course / MDM   Clinical Course as of 12/28/23 1745  Sat Dec 28, 2023  1519 2 weeks abd pain, epigastric, N/V only with constipation, otherwise diarrhea - labwork - po challenge [HG]    Clinical Course User Index [HG] Renella Cunas, MD   Medical Decision Making Amount and/or Complexity of Data Reviewed Labs: ordered.   Please see prior ED providers note for HPI and physical exam.  At the time of signout, I was awaiting lab work and P.o. challenge prior to discharge.  Lab work is reassuring.  No leukocytosis.  Hemoglobin is 10.9, comparable with patient's baseline.  CMP notable for no electrolyte or metabolic derangement, appropriate renal and liver function, appropriate glucose.  Patient received IV Phenergan per prior provider for nausea and emesis prior to arrival.  Tolerated p.o. several times while in the ED.  Above lab work was explained to patient.  Serum hCG had not yet resulted, however patient left the ED AMA prior to the results of her serum hcg.   Serum hcg was unable to be run following patient AMA as there was no blood left in the lab..  Urine pregnancy negative.   Renella Cunas, PGY-2 Emergency Medicine       Renella Cunas, MD 12/28/23 1610    Wynetta Fines, MD 12/29/23 [phone]

## 2023-12-28 NOTE — ED Triage Notes (Signed)
 Ems vitals  148/95 Hr 89 97% on ra Cbg 118

## 2023-12-28 NOTE — ED Notes (Addendum)
 Pt AOx4, GCS 15.   Pt ambulated from EMS stretcher to hospital bed without assistance.

## 2024-03-09 ENCOUNTER — Ambulatory Visit (HOSPITAL_COMMUNITY)
Admission: EM | Admit: 2024-03-09 | Discharge: 2024-03-09 | Disposition: A | Discharge status: Other Acute Inpatient Hospital | Payer: MEDICAID | Attending: Psychiatry | Admitting: Psychiatry

## 2024-03-09 ENCOUNTER — Other Ambulatory Visit: Payer: Self-pay

## 2024-03-09 ENCOUNTER — Inpatient Hospital Stay (HOSPITAL_COMMUNITY)
Admission: AD | Admit: 2024-03-09 | Discharge: 2024-03-15 | DRG: 885 | Disposition: A | Discharge status: Home / Self Care | Payer: MEDICAID | Source: Intra-hospital

## 2024-03-09 ENCOUNTER — Encounter (HOSPITAL_COMMUNITY): Payer: Self-pay | Admitting: Family Medicine

## 2024-03-09 DIAGNOSIS — Z91040 Latex allergy status: Secondary | ICD-10-CM | POA: Diagnosis not present

## 2024-03-09 DIAGNOSIS — Z91148 Patient's other noncompliance with medication regimen for other reason: Secondary | ICD-10-CM | POA: Diagnosis not present

## 2024-03-09 DIAGNOSIS — F4312 Post-traumatic stress disorder, chronic: Secondary | ICD-10-CM | POA: Diagnosis present

## 2024-03-09 DIAGNOSIS — Z560 Unemployment, unspecified: Secondary | ICD-10-CM

## 2024-03-09 DIAGNOSIS — Z9151 Personal history of suicidal behavior: Secondary | ICD-10-CM | POA: Diagnosis not present

## 2024-03-09 DIAGNOSIS — Z5919 Other inadequate housing: Secondary | ICD-10-CM | POA: Diagnosis not present

## 2024-03-09 DIAGNOSIS — Z803 Family history of malignant neoplasm of breast: Secondary | ICD-10-CM | POA: Diagnosis not present

## 2024-03-09 DIAGNOSIS — F314 Bipolar disorder, current episode depressed, severe, without psychotic features: Secondary | ICD-10-CM | POA: Insufficient documentation

## 2024-03-09 DIAGNOSIS — I10 Essential (primary) hypertension: Secondary | ICD-10-CM | POA: Diagnosis present

## 2024-03-09 DIAGNOSIS — F603 Borderline personality disorder: Secondary | ICD-10-CM | POA: Diagnosis present

## 2024-03-09 DIAGNOSIS — R7303 Prediabetes: Secondary | ICD-10-CM | POA: Diagnosis present

## 2024-03-09 DIAGNOSIS — Z98891 History of uterine scar from previous surgery: Secondary | ICD-10-CM

## 2024-03-09 DIAGNOSIS — Z8249 Family history of ischemic heart disease and other diseases of the circulatory system: Secondary | ICD-10-CM

## 2024-03-09 DIAGNOSIS — Z8616 Personal history of COVID-19: Secondary | ICD-10-CM | POA: Diagnosis not present

## 2024-03-09 DIAGNOSIS — Z644 Discord with counselors: Secondary | ICD-10-CM | POA: Insufficient documentation

## 2024-03-09 DIAGNOSIS — F411 Generalized anxiety disorder: Secondary | ICD-10-CM | POA: Diagnosis present

## 2024-03-09 DIAGNOSIS — Z833 Family history of diabetes mellitus: Secondary | ICD-10-CM | POA: Diagnosis not present

## 2024-03-09 DIAGNOSIS — R45850 Homicidal ideations: Secondary | ICD-10-CM | POA: Diagnosis present

## 2024-03-09 DIAGNOSIS — Z7722 Contact with and (suspected) exposure to environmental tobacco smoke (acute) (chronic): Secondary | ICD-10-CM | POA: Diagnosis present

## 2024-03-09 DIAGNOSIS — Z888 Allergy status to other drugs, medicaments and biological substances status: Secondary | ICD-10-CM | POA: Diagnosis not present

## 2024-03-09 DIAGNOSIS — Z818 Family history of other mental and behavioral disorders: Secondary | ICD-10-CM | POA: Diagnosis not present

## 2024-03-09 DIAGNOSIS — Z555 Less than a high school diploma: Secondary | ICD-10-CM

## 2024-03-09 DIAGNOSIS — Z8632 Personal history of gestational diabetes: Secondary | ICD-10-CM

## 2024-03-09 DIAGNOSIS — Z881 Allergy status to other antibiotic agents status: Secondary | ICD-10-CM

## 2024-03-09 DIAGNOSIS — E559 Vitamin D deficiency, unspecified: Secondary | ICD-10-CM | POA: Diagnosis present

## 2024-03-09 DIAGNOSIS — F4310 Post-traumatic stress disorder, unspecified: Secondary | ICD-10-CM | POA: Insufficient documentation

## 2024-03-09 LAB — URINALYSIS, ROUTINE W REFLEX MICROSCOPIC
Bacteria, UA: NONE SEEN
Bilirubin Urine: NEGATIVE
Glucose, UA: NEGATIVE mg/dL
Ketones, ur: NEGATIVE mg/dL
Nitrite: NEGATIVE
Protein, ur: 100 mg/dL — AB
RBC / HPF: >50 RBC/hpf (ref 0–5)
Specific Gravity, Urine: 1.025 (ref 1.005–1.030)
pH: 5 (ref 5.0–8.0)

## 2024-03-09 LAB — LIPID PANEL
Cholesterol: 142 mg/dL (ref 0–200)
HDL: 29 mg/dL — ABNORMAL LOW (ref >40)
LDL Cholesterol: 93 mg/dL (ref 0–99)
Total CHOL/HDL Ratio: 4.9 ratio
Triglycerides: 99 mg/dL (ref <150)
VLDL: 20 mg/dL (ref 0–40)

## 2024-03-09 LAB — CBC WITH DIFFERENTIAL/PLATELET
Abs Immature Granulocytes: 0.11 10*3/uL — ABNORMAL HIGH (ref 0.00–0.07)
Basophils Absolute: 0.1 10*3/uL (ref 0.0–0.1)
Basophils Relative: 1 %
Eosinophils Absolute: 0.1 10*3/uL (ref 0.0–0.5)
Eosinophils Relative: 1 %
HCT: 36.6 % (ref 36.0–46.0)
Hemoglobin: 10.9 g/dL — ABNORMAL LOW (ref 12.0–15.0)
Immature Granulocytes: 1 %
Lymphocytes Relative: 36 %
Lymphs Abs: 3.6 10*3/uL (ref 0.7–4.0)
MCH: 20.4 pg — ABNORMAL LOW (ref 26.0–34.0)
MCHC: 29.8 g/dL — ABNORMAL LOW (ref 30.0–36.0)
MCV: 68.4 fL — ABNORMAL LOW (ref 80.0–100.0)
Monocytes Absolute: 0.5 10*3/uL (ref 0.1–1.0)
Monocytes Relative: 5 %
Neutro Abs: 5.7 10*3/uL (ref 1.7–7.7)
Neutrophils Relative %: 56 %
Platelets: 420 10*3/uL — ABNORMAL HIGH (ref 150–400)
RBC: 5.35 MIL/uL — ABNORMAL HIGH (ref 3.87–5.11)
RDW: 19.6 % — ABNORMAL HIGH (ref 11.5–15.5)
WBC: 10 10*3/uL (ref 4.0–10.5)
nRBC: 0 % (ref 0.0–0.2)

## 2024-03-09 LAB — COMPREHENSIVE METABOLIC PANEL WITH GFR
ALT: 12 U/L (ref 0–44)
AST: 16 U/L (ref 15–41)
Albumin: 3.8 g/dL (ref 3.5–5.0)
Alkaline Phosphatase: 58 U/L (ref 38–126)
Anion gap: 7 (ref 5–15)
BUN: 8 mg/dL (ref 6–20)
CO2: 26 mmol/L (ref 22–32)
Calcium: 9 mg/dL (ref 8.9–10.3)
Chloride: 102 mmol/L (ref 98–111)
Creatinine, Ser: 0.73 mg/dL (ref 0.44–1.00)
GFR, Estimated: >60 mL/min (ref >60)
Glucose, Bld: 120 mg/dL — ABNORMAL HIGH (ref 70–99)
Potassium: 3.3 mmol/L — ABNORMAL LOW (ref 3.5–5.1)
Sodium: 135 mmol/L (ref 135–145)
Total Bilirubin: 1 mg/dL (ref 0.0–1.2)
Total Protein: 8.6 g/dL — ABNORMAL HIGH (ref 6.5–8.1)

## 2024-03-09 LAB — POCT URINE DRUG SCREEN - MANUAL ENTRY (I-SCREEN)
POC Amphetamine UR: NOT DETECTED
POC Buprenorphine (BUP): NOT DETECTED
POC Cocaine UR: NOT DETECTED
POC Marijuana UR: NOT DETECTED
POC Methadone UR: NOT DETECTED
POC Methamphetamine UR: NOT DETECTED
POC Morphine: NOT DETECTED
POC Oxazepam (BZO): NOT DETECTED
POC Oxycodone UR: NOT DETECTED
POC Secobarbital (BAR): NOT DETECTED

## 2024-03-09 LAB — POCT PREGNANCY, URINE: Preg Test, Ur: NEGATIVE

## 2024-03-09 LAB — HEMOGLOBIN A1C
Hgb A1c MFr Bld: 6.3 % — ABNORMAL HIGH (ref 4.8–5.6)
Mean Plasma Glucose: 134.11 mg/dL

## 2024-03-09 LAB — ETHANOL: Alcohol, Ethyl (B): <15 mg/dL (ref <15)

## 2024-03-09 LAB — TSH: TSH: 0.836 u[IU]/mL (ref 0.350–4.500)

## 2024-03-09 LAB — VITAMIN D 25 HYDROXY (VIT D DEFICIENCY, FRACTURES): Vit D, 25-Hydroxy: 6.53 ng/mL — ABNORMAL LOW (ref 30–100)

## 2024-03-09 MED ORDER — HALOPERIDOL 5 MG PO TABS: 5.0000 mg | ORAL_TABLET | Freq: Three times a day (TID) | ORAL | Status: DC | PRN

## 2024-03-09 MED ORDER — LORAZEPAM 2 MG/ML IJ SOLN: 2.0000 mg | Freq: Three times a day (TID) | INTRAMUSCULAR | Status: DC | PRN

## 2024-03-09 MED ORDER — DIPHENHYDRAMINE HCL 50 MG/ML IJ SOLN: 50.0000 mg | Freq: Three times a day (TID) | INTRAMUSCULAR | Status: DC | PRN

## 2024-03-09 MED ORDER — ACETAMINOPHEN 325 MG PO TABS: 650.0000 mg | ORAL_TABLET | Freq: Four times a day (QID) | ORAL | Status: DC | PRN

## 2024-03-09 MED ORDER — HALOPERIDOL LACTATE 5 MG/ML IJ SOLN: 10.0000 mg | Freq: Three times a day (TID) | INTRAMUSCULAR | Status: DC | PRN

## 2024-03-09 MED ORDER — HALOPERIDOL LACTATE 5 MG/ML IJ SOLN
10.0000 mg | Freq: Three times a day (TID) | INTRAMUSCULAR | Status: DC | PRN
Start: 2024-03-09 — End: 2024-03-09

## 2024-03-09 MED ORDER — DIPHENHYDRAMINE HCL 50 MG PO CAPS: 50.0000 mg | ORAL_CAPSULE | Freq: Three times a day (TID) | ORAL | Status: DC | PRN

## 2024-03-09 MED ORDER — NAPROXEN 375 MG PO TABS: 375.0000 mg | ORAL_TABLET | Freq: Two times a day (BID) | ORAL | Status: DC | PRN

## 2024-03-09 MED ORDER — ALUM & MAG HYDROXIDE-SIMETH 200-200-20 MG/5ML PO SUSP: 30.0000 mL | ORAL | Status: DC | PRN

## 2024-03-09 MED ORDER — HALOPERIDOL LACTATE 5 MG/ML IJ SOLN: 5.0000 mg | Freq: Three times a day (TID) | INTRAMUSCULAR | Status: DC | PRN

## 2024-03-09 MED ORDER — LORAZEPAM 2 MG/ML IJ SOLN
2.0000 mg | Freq: Three times a day (TID) | INTRAMUSCULAR | Status: DC | PRN
Start: 2024-03-09 — End: 2024-03-09

## 2024-03-09 MED ORDER — HYDROXYZINE HCL 25 MG PO TABS: 25.0000 mg | ORAL_TABLET | Freq: Three times a day (TID) | ORAL | Status: DC | PRN

## 2024-03-09 MED ORDER — QUETIAPINE FUMARATE ER 50 MG PO TB24: 50.0000 mg | ORAL_TABLET | Freq: Every day | ORAL | Status: DC

## 2024-03-09 MED ORDER — LORAZEPAM 1 MG PO TABS: 2.0000 mg | ORAL_TABLET | Freq: Once | ORAL | Status: DC

## 2024-03-09 MED ORDER — PROPRANOLOL HCL 10 MG PO TABS
10.0000 mg | ORAL_TABLET | Freq: Two times a day (BID) | ORAL | Status: DC
  Filled 2024-03-09: qty 1

## 2024-03-09 MED ORDER — DIPHENHYDRAMINE HCL 25 MG PO CAPS: 50.0000 mg | ORAL_CAPSULE | Freq: Three times a day (TID) | ORAL | Status: DC | PRN

## 2024-03-09 MED ORDER — MAGNESIUM HYDROXIDE 400 MG/5ML PO SUSP: 30.0000 mL | Freq: Every day | ORAL | Status: DC | PRN

## 2024-03-09 MED ORDER — TRAZODONE HCL 50 MG PO TABS: 50.0000 mg | ORAL_TABLET | Freq: Every evening | ORAL | Status: DC | PRN

## 2024-03-09 NOTE — ED Notes (Signed)
 Patient refused the propanolol.

## 2024-03-09 NOTE — ED Notes (Signed)
 Patient admitted to Adult Observation. She denies SI/HI or AVH. During assessment pt appears paranoid. She asks about the needle used to draw labs, believing it may still be in her arm. She is calm and cooperative. Pt states her bonnet is for religious purposes after asking her to remove it. Skin check conducted by this nurse and Fredrik Jensen, MHT. Patient oriented to the unit and provided food and beverage. We will continue to monitor for safety.

## 2024-03-09 NOTE — Progress Notes (Signed)
 Pt has been accepted to [facility] on 03/09/2024 Bed assignment: 401-2  Pt meets inpatient criteria per: Cydney Draft NP  Attending Physician will be: Dr. Zouev MD  Report can be called to: unit: Adult unit: [phone]  Pt can arrive after labs, vol, UDS, EKG.   Care Team Notified: [facility] Kathryn Parish RN, Cydney Draft NP, Walterine Gunther RN  [place] Prem Coykendall LCSW-A   03/09/2024 11:58 AM

## 2024-03-09 NOTE — Discharge Instructions (Signed)
Transferring to [facility].

## 2024-03-09 NOTE — ED Provider Notes (Signed)
 [facility] Continuous Assessment Admission H&P  Date: 03/09/24 Patient Name: Melinda Jimenez MRN: [id number] Chief Complaint: "I need a mental break"  Diagnoses:  Final diagnoses:  MDD (major depressive episode), single episode, severe, no psychosis (HCC)  Mood disorder in conditions classified elsewhere    HPI: Melinda Jimenez 38 year old, female, presented to [facility] as a walk in, calm, with complaints of  needing mental break and help with a lot of stressors, and thoughts of  harming others. Patient EMS  after calling 911.   Melinda Jimenez, 38 y.o., female patient seen face to face by this provider, consulted with Dr. Genita Keys, and chart reviewed on 03/09/24.    On evaluation Melinda Jimenez in her responses to questions asked.  When patient stated that she mental parotic disorder from her past but recently attempted to give additional insight from patient.  Patient further states that she has several things going on in her life that is causing her to be prescribed and did not want to disclose any additional information.  This Clinical research associate along with counselor patient wanted to get more insight as to what is currently going on in her life that is causing her emotional distress.  Patient stated that her kids were removed from her home December 2024 by DSS.  Since DSS removed her children from her home she has had consistent conflicts with the current DSS worker.  She reports that her current DSS worker had her food stamps discontinued however will not elaborate as to the reason the food stamps were discontinued.  She also reports that in order to get her children back she has to move into a new residence as her door had been kicked in and her current living situation was deemed unsafe.  Patient refuses to elaborate on what is going on with her current living situation. 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 She has not worked since 2020, did not  finish high school, and denies any current aspirations to return to school or obtain GED. Pt reports that she wants to get her children back however is very guarded as to the reason children were removed from the home. Patient reports having 4 children ranging in age 59-94 years old. She is unmarried and lives alone. Patient denies any illicit substance use, cannabis use, or alcohol use. Patient has had prior psychiatric admissions including suicidal ideations with an attempt to jump from a moving car 02/06/2020. Patient was seen by an outpatient provider in 2021 who refilled Quetiapine  50 mg and hydroxyzine  25 mg previously prescribed by psychiatric provider and documents a prior diagnosis of bipolar 1 disorder. Reports stopped taking medications as she does not like medications. Patient today voices that she is willing to restart medications and willing to go into mental health treatment.  On evaluation patient is visibly depressed, dysphoric, irritable, and labile.  Patient's affect is congruent with mood.  Patient is extremely guarded and reluctant to provide pertinent information however after a extensive discussion about how therapy and inpatient treatment only works with communication and a willingness to program and participate in treatment patient opened up and provided a little more information and was less guarded.  Patient denies any audio or visual hallucinations.  She endorses homicidal thoughts towards people in general and also the DSS worker who is taking care of her kids but endorses that she would never intentionally harm or hurt  anyone and has never attempted to harm or hurt anyone.  Patient does not have access to weapons and denies any specific plan for harming anyone.  Patient initially denied suicidal ideations at some point during the interview she stated that she was having suicidal thoughts however retracted that she was having suicidal thoughts had recently had any suicidal thoughts after  learning that she would be IVC'd and that she will still receive treatment regardless of suicidal thoughts due to clinically depression. Patient does exhibit a since of distrust toward this Clinical research associate and therapist Cyndra Dress, although clinically doesn't correlate to paranoia.  Given patient's history of a prior suicidal attempt in 2021, bipolar 1 diagnoses, in general clinical presentation I am recommending inpatient psychiatric treatment.  Patient is amenable to treatment and will remain voluntary.    Total Time spent with patient: 45 minutes  Musculoskeletal  Strength & Muscle Tone: within normal limits Gait & Station: normal Patient leans: N/A  Psychiatric Specialty Exam  Presentation General Appearance:  Casual  Eye Contact: Fleeting  Speech: Slow; Other (comment) (guarded)  Speech Volume: Decreased  Handedness: -- (did not assess)   Mood and Affect  Mood: Depressed; Dysphoric; Irritable  Affect: Blunt; Labile   Thought Process  Thought Processes: Irrevelant  Descriptions of Associations:Intact  Orientation:Full (Time, Place and Person)  Thought Content:Scattered    Hallucinations:Hallucinations: None  Ideas of Reference:None  Suicidal Thoughts:Suicidal Thoughts: No  Homicidal Thoughts:Homicidal Thoughts: Yes, Passive HI Passive Intent and/or Plan: Without Intent; Without Means to Carry Out   Sensorium  Memory: Other (comment) (guarded and limited responses)  Judgment: Fair  Insight: Poor   Executive Functions  Concentration: Poor  Attention Span: Poor  Recall: Poor  Fund of Knowledge: Poor  Language: Poor   Psychomotor Activity  Psychomotor Activity: Psychomotor Activity: Normal   Assets  Assets: Resilience   Sleep  Sleep: Sleep: Good Number of Hours of Sleep: 8   Nutritional Assessment (For OBS and FBC admissions only) Has the patient had a weight loss or gain of 10 pounds or more in the last 3 months?: No Has  the patient had a decrease in food intake/or appetite?: Yes Does the patient have dental problems?: No Does the patient have eating habits or behaviors that may be indicators of an eating disorder including binging or inducing vomiting?: No Has the patient recently lost weight without trying?: 0 Has the patient been eating poorly because of a decreased appetite?: 1 Malnutrition Screening Tool Score: 1    Physical Exam Constitutional:      General: She is not in acute distress.    Appearance: Normal appearance. She is not ill-appearing.  HENT:     Head: Normocephalic and atraumatic.     Nose: Nose normal.  Eyes:     Extraocular Movements: Extraocular movements intact.     Conjunctiva/sclera: Conjunctivae normal.     Pupils: Pupils are equal, round, and reactive to light.  Cardiovascular:     Rate and Rhythm: Normal rate and regular rhythm.  Pulmonary:     Effort: Pulmonary effort is normal.     Breath sounds: Normal breath sounds.  Musculoskeletal:     Cervical back: Normal range of motion and neck supple.  Skin:    General: Skin is warm and dry.  Neurological:     General: No focal deficit present.     Mental Status: She is alert and oriented to person, place, and time.    Review of Systems  Psychiatric/Behavioral:  Positive for  depression. Negative for hallucinations, memory loss, substance abuse and suicidal ideas. The patient is nervous/anxious and has insomnia.      Blood pressure 134/79, pulse 89, temperature 98.8 F (37.1 C), temperature source Oral, resp. rate 20, SpO2 99%, unknown if currently breastfeeding. There is no height or weight on file to calculate BMI.  Past Psychiatric History: GAD and MDD (per patient outpatient provider dx with Bipolar 1. Prior medication per chart review: Quetiapine  50 mg at bedtime and Hydroxyzine  25 mg TID.    Is the patient at risk to self? No  Has the patient been a risk to self in the past 6 months? No .    Has the patient  been a risk to self within the distant past? Yes   Is the patient a risk to others? Yes   Has the patient been a risk to others in the past 6 months? No   Has the patient been a risk to others within the distant past? No   Past Medical History: Headaches, anemia,  hx of PE 2022  Family History: Patient stated "my family has all kinds of mental health problems with their problems are not my problems".  Social History: Unemployed, mother of 4, currently has an open CPS case and does not have custody of children.  Did not complete high school.  Reports living alone.  Unclear of how she is financially supporting herself.  Last Labs:  Admission on 03/09/2024  Component Date Value Ref Range Status   Hgb A1c MFr Bld 03/09/2024 6.3 (H)  4.8 - 5.6 % Final   Comment: (NOTE) Diagnosis of Diabetes The following HbA1c ranges recommended by the American Diabetes Association (ADA) may be used as an aid in the diagnosis of diabetes mellitus.  Hemoglobin             Suggested A1C NGSP%              Diagnosis  <5.7                   Non Diabetic  5.7-6.4                Pre-Diabetic  >6.4                   Diabetic  <7.0                   Glycemic control for                       adults with diabetes.     Mean Plasma Glucose 03/09/2024 134.11  mg/dL Final   Performed at [facility], [address], [place], [place] [address]   POC Amphetamine UR 03/09/2024 None Detected   Final   POC Secobarbital (BAR) 03/09/2024 None Detected   Final   POC Buprenorphine (BUP) 03/09/2024 None Detected   Final   POC Oxazepam (BZO) 03/09/2024 None Detected   Final   POC Cocaine UR 03/09/2024 None Detected   Final   POC Methamphetamine UR 03/09/2024 None Detected   Final   POC Morphine  03/09/2024 None Detected   Final   POC Methadone UR 03/09/2024 None Detected   Final   POC Oxycodone  UR 03/09/2024 None Detected   Final   POC Marijuana UR 03/09/2024 None Detected   Final   Preg Test, Ur 03/09/2024  NEGATIVE  NEGATIVE Final   Comment:        THE  SENSITIVITY OF THIS METHODOLOGY IS >24 mIU/mL   Admission on 12/28/2023, Discharged on 12/28/2023  Component Date Value Ref Range Status   WBC 12/28/2023 9.7  4.0 - 10.5 K/uL Final   RBC 12/28/2023 5.27 (H)  3.87 - 5.11 MIL/uL Final   Hemoglobin 12/28/2023 10.9 (L)  12.0 - 15.0 g/dL Final   HCT 16/07/9603 36.9  36.0 - 46.0 % Final   MCV 12/28/2023 70.0 (L)  80.0 - 100.0 fL Final   MCH 12/28/2023 20.7 (L)  26.0 - 34.0 pg Final   MCHC 12/28/2023 29.5 (L)  30.0 - 36.0 g/dL Final   RDW 54/06/8118 18.2 (H)  11.5 - 15.5 % Final   Platelets 12/28/2023 391  150 - 400 K/uL Final   nRBC 12/28/2023 0.0  0.0 - 0.2 % Final   Neutrophils Relative % 12/28/2023 52  % Final   Neutro Abs 12/28/2023 5.0  1.7 - 7.7 K/uL Final   Lymphocytes Relative 12/28/2023 40  % Final   Lymphs Abs 12/28/2023 3.9  0.7 - 4.0 K/uL Final   Monocytes Relative 12/28/2023 7  % Final   Monocytes Absolute 12/28/2023 0.6  0.1 - 1.0 K/uL Final   Eosinophils Relative 12/28/2023 1  % Final   Eosinophils Absolute 12/28/2023 0.1  0.0 - 0.5 K/uL Final   Basophils Relative 12/28/2023 0  % Final   Basophils Absolute 12/28/2023 0.0  0.0 - 0.1 K/uL Final   Immature Granulocytes 12/28/2023 0  % Final   Abs Immature Granulocytes 12/28/2023 0.03  0.00 - 0.07 K/uL Final   Performed at [facility], [address], [place], [place] [address]   Sodium 12/28/2023 136  135 - 145 mmol/L Final   Potassium 12/28/2023 3.9  3.5 - 5.1 mmol/L Final   Chloride 12/28/2023 105  98 - 111 mmol/L Final   CO2 12/28/2023 22  22 - 32 mmol/L Final   Glucose, Bld 12/28/2023 86  70 - 99 mg/dL Final   Glucose reference range applies only to samples taken after fasting for at least 8 hours.   BUN 12/28/2023 10  6 - 20 mg/dL Final   Creatinine, Ser 12/28/2023 0.68  0.44 - 1.00 mg/dL Final   Calcium  12/28/2023 9.2  8.9 - 10.3 mg/dL Final   Total Protein 95/62/1308 8.2 (H)  6.5 - 8.1 g/dL Final   Albumin  65/78/4696 3.5  3.5 - 5.0 g/dL Final   AST 29/52/8413 15  15 - 41 U/L Final   ALT 12/28/2023 11  0 - 44 U/L Final   Alkaline Phosphatase 12/28/2023 61  38 - 126 U/L Final   Total Bilirubin 12/28/2023 0.6  0.0 - 1.2 mg/dL Final   GFR, Estimated 12/28/2023 >60  >60 mL/min Final   Comment: (NOTE) Calculated using the CKD-EPI Creatinine Equation (2021)    Anion gap 12/28/2023 9  5 - 15 Final   Performed at [facility], [address], [place], [place] [address]   Lipase 12/28/2023 24  11 - 51 U/L Final   Performed at [facility], [address], [place], [place] [address]   Color, Urine 12/28/2023 YELLOW  YELLOW Final   APPearance 12/28/2023 CLEAR  CLEAR Final   Specific Gravity, Urine 12/28/2023 1.010  1.005 - 1.030 Final   pH 12/28/2023 6.0  5.0 - 8.0 Final   Glucose, UA 12/28/2023 NEGATIVE  NEGATIVE mg/dL Final   Hgb urine dipstick 12/28/2023 SMALL (A)  NEGATIVE Final   Bilirubin Urine 12/28/2023 NEGATIVE  NEGATIVE Final   Ketones, ur 12/28/2023 NEGATIVE  NEGATIVE mg/dL Final   Protein, ur 40/98/1191 NEGATIVE  NEGATIVE mg/dL Final   Nitrite 47/82/9562 NEGATIVE  NEGATIVE Final   Leukocytes,Ua 12/28/2023 NEGATIVE  NEGATIVE Final   RBC / HPF 12/28/2023 0-5  0 - 5 RBC/hpf Final   WBC, UA 12/28/2023 0-5  0 - 5 WBC/hpf Final   Bacteria, UA 12/28/2023 RARE (A)  NONE SEEN Final   Squamous Epithelial / HPF 12/28/2023 0-5  0 - 5 /HPF Final   Mucus 12/28/2023 PRESENT   Final   Performed at [facility], [address] [place], [place] [address]   Preg Test, Ur 12/28/2023 NEGATIVE  NEGATIVE Final   Comment:        THE SENSITIVITY OF THIS METHODOLOGY IS >25 mIU/mL. Performed at [facility], [address], [place], [place] [address]   Clinical Support on 11/01/2023  Component Date Value Ref Range Status   Preg Test, Ur 11/01/2023 Negative  Negative Final   Urine Culture, Routine 11/01/2023 Final report   Final   Organism ID, Bacteria 11/01/2023 Comment   Final    Comment: Culture shows less than 10,000 colony forming units of bacteria per milliliter of urine. This colony count is not generally considered to be clinically significant.    Neisseria Gonorrhea 11/01/2023 Negative   Final   Chlamydia 11/01/2023 Negative   Final   Trichomonas 11/01/2023 Negative   Final   Comment 11/01/2023 Normal Reference Range Trichomonas - Negative   Final   Comment 11/01/2023 Normal Reference Ranger Chlamydia - Negative   Final   Comment 11/01/2023 Normal Reference Range Neisseria Gonorrhea - Negative   Final   Glucose, UA 11/01/2023 Negative  Negative Final   Protein, UA 11/01/2023 Negative  Negative Final   Leukocytes, UA 11/01/2023 Negative  Negative Final   Hepatitis B Surface Ag 11/01/2023 Negative  Negative Final   Hep C Virus Ab 11/01/2023 Non Reactive  Non Reactive Final   Comment: HCV antibody alone does not differentiate between previously resolved infection and active infection. Equivocal and Reactive HCV antibody results should be followed up with an HCV RNA test to support the diagnosis of active HCV infection.    RPR Ser Ql 11/01/2023 Non Reactive  Non Reactive Final   HIV Screen 4th Generation wRfx 11/01/2023 Non Reactive  Non Reactive Final   Comment: HIV-1/HIV-2 antibodies and HIV-1 p24 antigen were NOT detected. There is no laboratory evidence of HIV infection. HIV Negative     Allergies: Latex, Zithromax  [azithromycin ], and Zofran  [ondansetron ]  Medications:  Facility Ordered Medications  Medication   acetaminophen  (TYLENOL ) tablet 650 mg   alum & mag hydroxide-simeth (MAALOX/MYLANTA) 200-200-20 MG/5ML suspension 30 mL   magnesium hydroxide (MILK OF MAGNESIA) suspension 30 mL   haloperidol (HALDOL) tablet 5 mg   And   diphenhydrAMINE  (BENADRYL ) capsule 50 mg   haloperidol lactate (HALDOL) injection 5 mg   And   diphenhydrAMINE  (BENADRYL ) injection 50 mg   And   LORazepam  (ATIVAN ) injection 2 mg   haloperidol lactate (HALDOL)  injection 10 mg   And   diphenhydrAMINE  (BENADRYL ) injection 50 mg   And   LORazepam  (ATIVAN ) injection 2 mg   hydrOXYzine  (ATARAX ) tablet 25 mg   traZODone  (DESYREL ) tablet 50 mg   QUEtiapine  (SEROQUEL  XR) 24 hr tablet 50 mg   propranolol (INDERAL) tablet 10 mg   naproxen  (NAPROSYN ) tablet 375 mg      Medical Decision Making  Patient  needs inpatient psychiatric treatment criteria and has been accepted to [facility] for inpatient psychiatric treatment.  Restarted previously prescribed Seroquel  50 mg at bedtime to help with mood lability.  Agitation protocol ordered.  Patient is not prescribed any chronic home meds. For anxiety prescribe propranolol 10 mg twice daily.  Patient will remain on the continuous assessment unit until her bed is available at [facility].  Basic labs ordered and pending: CBC, CMP, LIPID, TSH,  Vitamin D , A1C, UDS  ECG- [id number]  Recommendations  Based on my evaluation the patient does not appear to have an emergency medical condition. Patient meets inpatient psychiatric criteria and has been accepted to [facility] Cydney Draft, NP 03/09/24  1:09 PM

## 2024-03-09 NOTE — Progress Notes (Signed)
 Writer explain at the change of shift. 19:45 I am the MHT I will conduct  safety check every 15 minutes. Birdell said, "I do not have to check on her every 15 minutes. Writer explain I will continue to do the 15 minutes safety check. Asanti said you do not have to keep coming in this room Bitch. RN notify

## 2024-03-09 NOTE — ED Provider Notes (Signed)
 Patient has become overly anxious and acting to leave. Patient remains very labile and guarded and given behavior and hx of bipolar and homicidal threats she is being IVC'd.  IVC petition reads as follows:  Respondent with a history of bipolar disorder and distant hisotry of suicide attempt presented for psychiatric evaluation. Respondent admits to experiencing homicidal thoughts toward people and DSS worker who has custody of her children. Respondent is labile, guarded, and paranoid. Respondent is a danger to others and self due to current mental state and has been recommended for inpatient psychaitric treatment.  Patient has been accepted to [facility]
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
## 2024-03-09 NOTE — ED Notes (Signed)
 Pt with flatulence. Offered medication but pt declined. Pt declined ativan . She states she doesn't like to take medication and will meditate if she needs to relax.
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
## 2024-03-09 NOTE — Tx Team (Signed)
 Initial Treatment Plan 03/09/2024 7:00 PM Melinda Jimenez [id number]    PATIENT STRESSORS: Legal issue   Loss of children custody   Traumatic event     PATIENT STRENGTHS: Ability for insight    PATIENT IDENTIFIED PROBLEMS: Homicidal ideation towards DSS  Depression  Anxiety  Agitation   Irritabilty             DISCHARGE CRITERIA:  Ability to meet basic life and health needs Verbal commitment to aftercare and medication compliance  PRELIMINARY DISCHARGE PLAN: Return to previous living arrangement  PATIENT/FAMILY INVOLVEMENT: This treatment plan has been presented to and reviewed with the patient, Melinda Jimenez, has been given the opportunity to ask questions and make suggestions.  Jeoffrey Mole, RN 03/09/2024, 7:00 PM

## 2024-03-10 ENCOUNTER — Encounter (HOSPITAL_COMMUNITY): Payer: Self-pay | Admitting: Family Medicine

## 2024-03-10 DIAGNOSIS — E559 Vitamin D deficiency, unspecified: Secondary | ICD-10-CM

## 2024-03-10 DIAGNOSIS — F319 Bipolar disorder, unspecified: Secondary | ICD-10-CM

## 2024-03-10 DIAGNOSIS — F4312 Post-traumatic stress disorder, chronic: Secondary | ICD-10-CM

## 2024-03-10 DIAGNOSIS — F314 Bipolar disorder, current episode depressed, severe, without psychotic features: Secondary | ICD-10-CM | POA: Diagnosis not present

## 2024-03-10 DIAGNOSIS — R7303 Prediabetes: Secondary | ICD-10-CM | POA: Insufficient documentation

## 2024-03-10 DIAGNOSIS — F4310 Post-traumatic stress disorder, unspecified: Secondary | ICD-10-CM | POA: Insufficient documentation

## 2024-03-10 DIAGNOSIS — F603 Borderline personality disorder: Secondary | ICD-10-CM | POA: Insufficient documentation

## 2024-03-10 HISTORY — DX: Bipolar disorder, unspecified: F31.9

## 2024-03-10 HISTORY — DX: Post-traumatic stress disorder, chronic: F43.12

## 2024-03-10 HISTORY — DX: Vitamin D deficiency, unspecified: E55.9

## 2024-03-10 LAB — HIV ANTIBODY (ROUTINE TESTING W REFLEX): HIV Screen 4th Generation wRfx: NONREACTIVE

## 2024-03-10 LAB — FOLATE: Folate: 9.7 ng/mL (ref >5.9)

## 2024-03-10 LAB — VITAMIN B12: Vitamin B-12: 252 pg/mL (ref 180–914)

## 2024-03-10 MED ORDER — HYDROXYZINE HCL 25 MG PO TABS: 25.0000 mg | ORAL_TABLET | Freq: Three times a day (TID) | ORAL | Status: DC | PRN

## 2024-03-10 MED ORDER — VITAMIN D (ERGOCALCIFEROL) 1.25 MG (50000 UNIT) PO CAPS
50000.0000 [IU] | ORAL_CAPSULE | Freq: Every day | ORAL | Status: DC
  Administered 2024-03-11: 50000 [IU] via ORAL
  Filled 2024-03-10: qty 1

## 2024-03-10 MED ORDER — ARIPIPRAZOLE 5 MG PO TABS
5.0000 mg | ORAL_TABLET | Freq: Every day | ORAL | Status: DC
  Administered 2024-03-11: 5 mg via ORAL
  Filled 2024-03-10 (×2): qty 1

## 2024-03-10 MED ORDER — TRAZODONE HCL 50 MG PO TABS: 50.0000 mg | ORAL_TABLET | Freq: Every evening | ORAL | Status: DC | PRN

## 2024-03-10 NOTE — Plan of Care (Signed)
  Problem: Education: Goal: Knowledge of [facility] Education information/materials will improve Outcome: Progressing Goal: Mental status will improve Outcome: Progressing   Problem: Coping: Goal: Ability to verbalize frustrations and anger appropriately will improve Outcome: Progressing

## 2024-03-10 NOTE — Group Note (Signed)
 LCSW Group Therapy Note   Group Date: 03/10/2024 Start Time: 1100 End Time: 1200   Participation:  did not attend  Type of Therapy:  Group Therapy  Topic:  Speaking from the Heart: Communicating with Understanding and Empathy  Objective:  To help participants develop effective communication skills to express themselves clearly, listen actively, and navigate conflicts in a healthy way.  Goals: Increase awareness of verbal and non-verbal communication skills. Practice using "I" statements and active listening techniques. Learn coping strategies for managing communication stress.  Summary:  Participants explored the importance of communication, discussed challenges, and practiced skills such as active listening and assertive expression. They reflected on past experiences and identified ways to improve communication in their daily lives.  Therapeutic Modalities: Cognitive-Behavioral Therapy (CBT): Restructuring negative thought patterns in communication. Mindfulness: Staying present and calm during conversations. Psychoeducation: Learning about effective communication techniques.   Dantavious Snowball O Ellianna Ruest, LCSWA 03/10/2024  5:53 PM

## 2024-03-10 NOTE — [facility] Suicide Risk Assessment (Signed)
 [facility] Admission Suicide Risk Assessment   Nursing information obtained from:  Patient Demographic factors:  Living alone Current Mental Status:  NA Loss Factors:  NA Historical Factors:  NA Risk Reduction Factors:  NA  Total Time spent with patient: 1 hour Principal Problem: Bipolar 1 disorder, depressed, severe (HCC) Diagnosis:  Principal Problem:   Bipolar 1 disorder, depressed, severe (HCC) Active Problems:   Vitamin D  deficiency   GAD (generalized anxiety disorder)   PTSD (post-traumatic stress disorder)   Borderline personality disorder (HCC)  Subjective Data: Melinda Jimenez is a 38 y.o. female who has a past medical history of Anemia, Anxiety, Bipolar 1 disorder (HCC) (03/10/2024), Borderline personality disorder (HCC) (03/10/2024), Brief psychotic disorder (HCC) (04/03/2020), Carpal tunnel syndrome, Cesarean delivery delivered (10/10/2020), Chlamydia, Chronic hypertension, Chronic post-traumatic stress disorder (PTSD) (03/10/2024), COVID-19 (09/12/2020), Genital HSV, GERD (gastroesophageal reflux disease), GERD without esophagitis (01/20/2013), Gestational diabetes, Goiter, unspecified (04/18/2010), H/O: cesarean section (09/27/2016), Headache(784.0), Herpes genitalis in women (04/18/2010), IBS (irritable bowel syndrome), Infection, Intrauterine pregnancy (03/22/2020), Panic attack, Premature rupture of membranes (PROM), onset of labor after 24 hours, antepartum, preterm, third trimester (10/10/2020), Pulmonary embolus (HCC) (10/18/2020), Thyroid  disease, Trichomonas, and Vitamin D  deficiency (03/10/2024). She presented for Bipolar 1 disorder, depressed, severe (HCC) [F31.4].  She presented via EMS for "mental breakdown" and homicidal ideation.  She appears to be a poor and unreliable historian.  Per LCAS Melinda, Jimenez is a 38 year old female presents to [facility] escorted by EMS. Patient presented guarded, provided little detail in her reason for coming to treatment only stating, "I needed to  get away." Patient denied SI and psychosis. Patient reported being mad at the world due to life stressors related to dealing with DSS worker, financial (unemployed) and housing. Patient presented as a sad affect and became emotionally during the assessment. Patient report past history of Bipolar and anxiety , denied symptoms related to Bipolar disorder. Patent denied being on medication. Reports she needs a psychological evaluations to be in compliant with DSS. Patient reported multiple stressors financial (unemployed), attempting to follow DSS requirements, and untreated mental health. Client is not currently on any medications. Client reported feeling light headed due to the inability to hold food on her stomach. Report no issues with sleep, diet, and denied additional complaints.   Per NP Melinda Jimenez 38 year old, female, presented to [facility] as a walk in, calm, with complaints of  needing mental break and help with a lot of stressors, and thoughts of  harming others. Patient EMS  after calling 911.    Melinda Jimenez, 38 y.o., female patient seen face to face by this provider, consulted with Dr. Genita Keys, and chart reviewed on 03/09/24.     On evaluation Melinda Jimenez in her responses to questions asked.  When patient stated that she mental parotic disorder from her past but recently attempted to give additional insight from patient.  Patient further states that she has several things going on in her life that is causing her to be prescribed and did not want to disclose any additional information.  This Clinical research associate along with counselor patient wanted to get more insight as to what is currently going on in her life that is causing her emotional distress.  Patient stated that her kids were removed from her home December 2024 by DSS.  Since DSS removed her children from her home she has had consistent conflicts with the current DSS worker.  She reports that her current DSS worker had her  food stamps  discontinued however will not elaborate as to the reason the food stamps were discontinued.  She also reports that in order to get her children back she has to move into a new residence as her door had been kicked in and her current living situation was deemed unsafe.  Patient refuses to elaborate on what is going on with her current living situation. 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 She has not worked since 2020, did not finish high school, and denies any current aspirations to return to school or obtain GED. Pt reports that she wants to get her children back however is very guarded as to the reason children were removed from the home. Patient reports having 4 children ranging in age 69-26 years old. She is unmarried and lives alone. Patient denies any illicit substance use, cannabis use, or alcohol use. Patient has had prior psychiatric admissions including suicidal ideations with an attempt to jump from a moving car 02/06/2020. Patient was seen by an outpatient provider in 2021 who refilled Quetiapine  50 mg and hydroxyzine  25 mg previously prescribed by psychiatric provider and documents a prior diagnosis of bipolar 1 disorder. Reports stopped taking medications as she does not like medications. Patient today voices that she is willing to restart medications and willing to go into mental health treatment.   On evaluation patient is visibly depressed, dysphoric, irritable, and labile.  Patient's affect is congruent with mood.  Patient is extremely guarded and reluctant to provide pertinent information however after a extensive discussion about how therapy and inpatient treatment only works with communication and a willingness to program and participate in treatment patient opened up and provided a little more information and was less guarded.  Patient denies any audio or visual hallucinations.  She endorses homicidal thoughts towards people in general  and also the DSS worker who is taking care of her kids but endorses that she would never intentionally harm or hurt anyone and has never attempted to harm or hurt anyone.  Patient does not have access to weapons and denies any specific plan for harming anyone.  Patient initially denied suicidal ideations at some point during the interview she stated that she was having suicidal thoughts however retracted that she was having suicidal thoughts had recently had any suicidal thoughts after learning that she would be IVC'd and that she will still receive treatment regardless of suicidal thoughts due to clinically depression. Patient does exhibit a since of distrust toward this Clinical research associate and therapist Cyndra Dress, although clinically doesn't correlate to paranoia.  Given patient's history of a prior suicidal attempt in 2021, bipolar 1 diagnoses, in general clinical presentation I am recommending inpatient psychiatric treatment.  Patient is amenable to treatment and will remain voluntary.  Per RN Armenta Landau, During assessment pt appears paranoid. She asks about the needle used to draw labs, believing it may still be in her arm. She is calm and cooperative. Pt states her bonnet is for religious purposes after asking her to remove it.    Patient refused the propanolol.   Per NP Raquel Cables, Patient has become overly anxious and acting to leave. Patient remains very labile and guarded and given behavior and hx of bipolar and homicidal threats she is being IVC'd.   IVC petition reads as follows:   Respondent with a history of bipolar disorder and distant hisotry of suicide attempt presented for psychiatric evaluation. Respondent admits to experiencing homicidal  thoughts toward people and DSS worker who has custody of her children. Respondent is labile, guarded, and paranoid. Respondent is a danger to others and self due to current mental state and has been recommended for inpatient psychaitric treatment.  On my assessment the  patient tells me that "they brought me in for an evaluation".  She is very evasive about the circumstances leading to her hospitalization.  She also did not mention that she had been involuntarily committed, and requested discharge.  She was vague and evasive in providing a history of present illness, past psychiatric history, family history, and even a social history.  She appeared to particularly paranoid when asked questions about the social history.  She tells me that she has been depressed since November because her children are in the father's custody.  She is vague and evasive but states that the courts remove the children for "failure to protect".  She then tells me that the court thought she was too depressed to care for her children.  This is inconsistent with the reports she gave to nurse practitioner Raquel Cables.  She was not forthcoming with her history of attempted suicide, or with previous psychiatric admissions.  The patient reports symptoms of major depressive disorder including depressed mood, anhedonia, decreased appetite with weight loss, insomnia, increased fatigue, feelings of hopelessness, helplessness, and worthlessness, feelings of guilt, decreased concentration, recurrent thoughts of death, and suicidal ideation.  Patient reports symptoms of generalized anxiety disorder including restlessness, feeling on edge, feeling easily fatigued, difficulty concentrating, irritability, muscle tension, and sleep disturbance.  The symptoms have been ongoing for greater than 6 months.  The patient reports PTSD symptoms including intrusive memories, psychological reactions to trauma related cues, avoidance, persistent negative beliefs, persistent negative emotions, hyperarousal, hypervigilance, and disturbed sleep.  The patient reports symptoms of borderline personality disorder including unstable relationships, identity disturbance, fear of abandonment, impulsivity, self-harm and suicidal  behaviors, emotional instability, persistent feelings of emptiness, inappropriate anger, and transient stress-related dissociation.  She does not endorse substance or alcohol abuse.  Urine drug screen is pending.  The patient meets criteria for a bipolar 1 diagnosis as she has been hospitalized in a mixed manic state.  Her symptoms are more consistent with borderline personality disorder however.  I believe that she would benefit from a mood stabilizer; however, she was diametrically opposed to starting any medication.  She was quite irritable with the fact that she had been involuntarily committed, and also stated that she would not participate in groups.  Reassurance and supportive psychotherapy was provided.   Continued Clinical Symptoms:  Alcohol Use Disorder Identification Test Final Score (AUDIT): 0 The "Alcohol Use Disorders Identification Test", Guidelines for Use in Primary Care, Second Edition.  World Science writer [facility]). Score between 0-7:  no or low risk or alcohol related problems. Score between 8-15:  moderate risk of alcohol related problems. Score between 16-19:  high risk of alcohol related problems. Score 20 or above:  warrants further diagnostic evaluation for alcohol dependence and treatment.   CLINICAL FACTORS:   Severe Anxiety and/or Agitation Panic Attacks Bipolar Disorder:   Mixed State Personality Disorders:   Cluster B More than one psychiatric diagnosis Unstable or Poor Therapeutic Relationship Previous Psychiatric Diagnoses and Treatments   Musculoskeletal: Strength & Muscle Tone: within normal limits Gait & Station: normal Patient leans: N/A  Psychiatric Specialty Exam:  Presentation  General Appearance:  Fairly Groomed  Eye Contact: Fair  Speech: Slow  Speech Volume: Normal  Handedness: Right  Mood and Affect  Mood: Depressed; Anxious; Irritable; Dysphoric  Affect: Restricted; Congruent; Labile   Thought Process  Thought  Processes: Linear  Descriptions of Associations:Intact  Orientation:Partial  Thought Content:Illogical; Scattered  History of Schizophrenia/Schizoaffective disorder:No data recorded Duration of Psychotic Symptoms:No data recorded Hallucinations:Hallucinations: None  Ideas of Reference:None  Suicidal Thoughts:Suicidal Thoughts: No  Homicidal Thoughts:Homicidal Thoughts: Yes, Passive HI Passive Intent and/or Plan: Without Intent; Without Means to [employer]  Memory: Recent Good; Immediate Fair  Judgment: Poor  Insight: Poor   Executive Functions  Concentration: Fair  Attention Span: Fair  Recall: Good  Fund of Knowledge: Good  Language: Good   Psychomotor Activity  Psychomotor Activity: Psychomotor Activity: Restlessness   Assets  Assets: Communication Skills; Housing; Physical Health   Sleep  Sleep: Sleep: Fair Number of Hours of Sleep: 8    Physical Exam: Physical Exam ROS Blood pressure 129/82, pulse 91, temperature 98.6 F (37 C), temperature source Oral, resp. rate 17, height 5\' 7"  (1.702 m), weight 105.7 kg, SpO2 100%, unknown if currently breastfeeding. Body mass index is 36.49 kg/m.   COGNITIVE FEATURES THAT CONTRIBUTE TO RISK:  Closed-mindedness and Polarized thinking    SUICIDE RISK:   Moderate:  Frequent suicidal ideation with limited intensity, and duration, some specificity in terms of plans, no associated intent, good self-control, limited dysphoria/symptomatology, some risk factors present, and identifiable protective factors, including available and accessible social support.  PLAN OF CARE: See history and physical  I certify that inpatient services furnished can reasonably be expected to improve the patient's condition.   Timmothy Foots, MD 03/10/2024, 12:42 PM

## 2024-03-10 NOTE — Progress Notes (Signed)
   03/10/24 0750  Psych Admission Type (Psych Patients Only)  Admission Status Involuntary  Psychosocial Assessment  Patient Complaints Anxiety;Isolation  Eye Contact Suspiciousness  Facial Expression Anxious  Affect Depressed  Speech Logical/coherent  Interaction Guarded  Motor Activity Fidgety  Appearance/Hygiene Unremarkable  Behavior Characteristics Anxious  Mood Depressed  Thought Process  Coherency WDL  Content WDL  Delusions None reported or observed  Perception WDL  Hallucination None reported or observed  Judgment Limited  Confusion None  Danger to Self  Current suicidal ideation? Denies  Agreement Not to Harm Self Yes  Description of Agreement verbal  Danger to Others  Danger to Others None reported or observed

## 2024-03-10 NOTE — Group Note (Signed)
 Recreation Therapy Group Note   Group Topic:Animal Assisted Therapy   Group Date: 03/10/2024 Start Time: 0946 End Time: 1030 Facilitators: Kaislee Chao-McCall, LRT,CTRS Location: [address] Dayroom   Animal-Assisted Activity (AAA) Program Checklist/Progress Notes Patient Eligibility Criteria Checklist & Daily Group note for Rec Tx Intervention  AAA/T Program Assumption of Risk Form signed by Patient/ or Parent Legal Guardian Yes  Patient understands his/her participation is voluntary Yes  Behavioral Response:    Education: Charity fundraiser, Appropriate Animal Interaction   Education Outcome: Acknowledges education.    Affect/Mood: N/A   Participation Level: Did not attend    Clinical Observations/Individualized Feedback:     Plan: Continue to engage patient in RT group sessions 2-3x/week.   Melinda Jimenez, LRT,CTRS 03/10/2024 1:21 PM

## 2024-03-10 NOTE — Group Note (Signed)
 Date:  03/10/2024 Time:  9:08 AM  Group Topic/Focus:  Goals Group:   The focus of this group is to help patients establish daily goals to achieve during treatment and discuss how the patient can incorporate goal setting into their daily lives to aide in recovery. Orientation:   The focus of this group is to educate the patient on the purpose and policies of crisis stabilization and provide a format to answer questions about their admission.  The group details unit policies and expectations of patients while admitted.    Participation Level:  Did Not Attend

## 2024-03-10 NOTE — Progress Notes (Signed)
   03/09/24 2200  Psych Admission Type (Psych Patients Only)  Admission Status Involuntary  Psychosocial Assessment  Patient Complaints Anxiety;Anger;Isolation  Eye Contact Suspiciousness  Facial Expression Angry;Anxious  Affect Depressed;Labile;Anxious;Irritable  Arboriculturist;Hostile  Motor Activity Fidgety  Appearance/Hygiene Unremarkable  Behavior Characteristics Irritable;Anxious  Mood Preoccupied;Depressed  Thought Process  Coherency WDL  Content WDL  Delusions None reported or observed  Perception WDL  Hallucination None reported or observed  Judgment Limited  Confusion Severe  Danger to Self  Current suicidal ideation? Denies  Self-Injurious Behavior No self-injurious ideation or behavior indicators observed or expressed   Agreement Not to Harm Self Yes  Description of Agreement verbal  Danger to Others  Danger to Others None reported or observed   Pt has been resistant to care , verbally abusive towards staff, demanding to be discharged and does not want staff in her room doing 15 min checks. Pt informed the pt that 15 min checks was to make sure she and other pt were safe while here in the hospital, will continue to monitor.

## 2024-03-10 NOTE — H&P (Signed)
 Psychiatric Admission Assessment Adult  Patient Identification: Melinda Jimenez  MRN:  [id number]  Date of Evaluation:  03/10/24  Chief Complaint:  Bipolar 1 disorder, depressed, severe (HCC) [F31.4]   Principal Diagnosis: Bipolar 1 disorder, depressed, severe (HCC)  Diagnosis:  Principal Problem:   Bipolar 1 disorder, depressed, severe (HCC) Active Problems:   Vitamin D  deficiency   GAD (generalized anxiety disorder)   PTSD (post-traumatic stress disorder)   Borderline personality disorder (HCC)   Prediabetes    Chief Complaint: "They brought me in for an evaluation"   History of Present Illness: Melinda Jimenez is a 38 y.o. 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), Carpal tunnel syndrome, Cesarean delivery delivered (10/10/2020), Chlamydia, Chronic hypertension, Chronic post-traumatic stress disorder (PTSD) (03/10/2024), COVID-19 (09/12/2020), Genital HSV, GERD (gastroesophageal reflux disease), GERD without esophagitis (01/20/2013), Gestational diabetes, Goiter, unspecified (04/18/2010), H/O: cesarean section (09/27/2016), Headache(784.0), Herpes genitalis in women (04/18/2010), IBS (irritable bowel syndrome), Infection, Intrauterine pregnancy (03/22/2020), Panic attack, Premature rupture of membranes (PROM), onset of labor after 24 hours, antepartum, preterm, third trimester (10/10/2020), Pulmonary embolus (HCC) (10/18/2020), Thyroid  disease, Trichomonas, and Vitamin D  deficiency (03/10/2024).  She presented to [facility] for Bipolar 1 disorder, depressed, severe (HCC).  She presented via EMS for "mental breakdown" and homicidal ideation.  She appears to be a poor and unreliable historian.   Per LCAS Darcelle, Herr[place] is a 38 year old female presents to [facility] escorted by EMS. is a 38 year old female presents to [facility] escorted by EMS. Patient presented guarded, provided little detail in her reason for coming to  treatment only stating, "I needed to get away." Patient denied SI and psychosis. Patient reported being mad at the world due to life stressors related to dealing with DSS worker, financial (unemployed) and housing. Patient presented as a sad affect and became emotionally during the assessment. Patient report past history of Bipolar and anxiety , denied symptoms related to Bipolar disorder. Patent denied being on medication. Reports she needs a psychological evaluations to be in compliant with DSS. Patient reported multiple stressors financial (unemployed), attempting to follow DSS requirements, and untreated mental health. Client is not currently on any medications. Client reported feeling light headed due to the inability to hold food on her stomach. Report no issues with sleep, diet, and denied additional complaints.    Per NP [name] 38 year old, female, presented to [facility] as a walk in, calm, with complaints of  needing mental break and help with a lot of stressors, and thoughts of  harming others. Patient EMS  after calling 911. old, female, presented to [facility] as a walk in, calm, with complaints of  needing mental break and help with a lot of stressors, and thoughts of  harming others. Patient EMS  after calling 911.    Melinda Jimenez, 38 y.o., female patient seen face to face by this provider, consulted with Dr. Genita Keys, and chart reviewed on 03/09/24., female patient seen face to face by this provider, consulted with Dr. Genita Keys, and chart reviewed on 03/09/24.     On evaluation Melinda Jimenez in her responses to questions asked.  When patient stated that she mental parotic disorder from her past but recently attempted to give additional insight from patient.  Patient further states that she has several things going on in her life that is causing her to be prescribed and did not want to disclose any additional information.  This Clinical research associate along with counselor patient wanted to get more insight as to what is currently going on in her life that is causing her emotional distress.  Patient stated that her kids were removed from her home December 2024 by DSS.  Since DSS removed her children from her home she has had consistent conflicts with the current  DSS worker.  She reports that her current DSS  worker had her food stamps discontinued however will not elaborate as to the reason the food stamps were discontinued.  She also reports that in order to get her children back she has to move into a new residence as her door had been kicked in and her current living situation was deemed unsafe.  Patient refuses to elaborate on what is going on with her current living situation. 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 She has not worked since 2020, did not finish high school, and denies any current aspirations to return to school or obtain GED. Pt reports that she wants to get her children back however is very guarded as to the reason children were removed from the home. Patient reports having 4 children ranging in age 72-57 years old. She is unmarried and lives alone. Patient denies any illicit substance use, cannabis use, or alcohol use. Patient has had prior psychiatric admissions including suicidal ideations with an attempt to jump from a moving car 02/06/2020. Patient was seen by an outpatient provider in 2021 who refilled Quetiapine  50 mg and hydroxyzine  25 mg previously prescribed by psychiatric provider and documents a prior diagnosis of bipolar 1 disorder. Reports stopped taking medications as she does not like medications. Patient today voices that she is willing to restart medications and willing to go into mental health treatment.   On evaluation patient is visibly depressed, dysphoric, irritable, and labile.  Patient's affect is congruent with mood.  Patient is extremely guarded and reluctant to provide pertinent information however after a extensive discussion about how therapy and inpatient treatment only works with communication and a willingness to program and participate in treatment patient opened up and provided a little more information and was less guarded.  Patient denies any audio or visual hallucinations.  She endorses homicidal  thoughts towards people in general and also the DSS worker who is taking care of her kids but endorses that she would never intentionally harm or hurt anyone and has never attempted to harm or hurt anyone.  Patient does not have access to weapons and denies any specific plan for harming anyone.  Patient initially denied suicidal ideations at some point during the interview she stated that she was having suicidal thoughts however retracted that she was having suicidal thoughts had recently had any suicidal thoughts after learning that she would be IVC'd and that she will still receive treatment regardless of suicidal thoughts due to clinically depression. Patient does exhibit a since of distrust toward this Clinical research associate and therapist Cyndra Dress, although clinically doesn't correlate to paranoia.  Given patient's history of a prior suicidal attempt in 2021, bipolar 1 diagnoses, in general clinical presentation I am recommending inpatient psychiatric treatment.  Patient is amenable to treatment and will remain voluntary.   Per RN Armenta Landau, During assessment pt appears paranoid. She asks about the needle used to draw labs, believing it may still be in her arm. She is calm and cooperative. Pt states her bonnet is for religious purposes after asking her to remove it.     Patient refused the propanolol.    Per NP Raquel Cables, Patient has become overly anxious and acting to leave. Patient remains very labile and guarded and given behavior and hx of bipolar and homicidal threats she is being IVC'd.   IVC petition reads as follows:   Respondent with a history of bipolar disorder  and distant hisotry of suicide attempt presented for psychiatric evaluation. Respondent admits to experiencing homicidal thoughts toward people and DSS worker who has custody of her children. Respondent is labile, guarded, and paranoid. Respondent is a danger to others and self due to current mental state and has been recommended for inpatient  psychaitric treatment.   On my assessment the patient tells me that "they brought me in for an evaluation".  She is very evasive about the circumstances leading to her hospitalization.  She also did not mention that she had been involuntarily committed, and requested discharge.  She was vague and evasive in providing a history of present illness, past psychiatric history, family history, and even a social history.  She appeared to particularly paranoid when asked questions about the social history.   She tells me that she has been depressed since November because her children are in the father's custody.  She is vague and evasive but states that the courts remove the children for "failure to protect".  She then tells me that the court thought she was too depressed to care for her children.  This is inconsistent with the reports she gave to nurse practitioner Raquel Cables.   She was not forthcoming with her history of attempted suicide, or with previous psychiatric admissions.   The patient reports symptoms of major depressive disorder including depressed mood, anhedonia, decreased appetite with weight loss, insomnia, increased fatigue, feelings of hopelessness, helplessness, and worthlessness, feelings of guilt, decreased concentration, recurrent thoughts of death, and suicidal ideation.   Patient reports symptoms of generalized anxiety disorder including restlessness, feeling on edge, feeling easily fatigued, difficulty concentrating, irritability, muscle tension, and sleep disturbance.  The symptoms have been ongoing for greater than 6 months.   The patient reports PTSD symptoms including intrusive memories, psychological reactions to trauma related cues, avoidance, persistent negative beliefs, persistent negative emotions, hyperarousal, hypervigilance, and disturbed sleep.   The patient reports symptoms of borderline personality disorder including unstable relationships, identity disturbance, fear of  abandonment, impulsivity, self-harm and suicidal behaviors, emotional instability, persistent feelings of emptiness, inappropriate anger, and transient stress-related dissociation.   She does not endorse substance or alcohol abuse.  Urine drug screen is pending.   The patient meets criteria for a bipolar 1 diagnosis as she has been hospitalized in a mixed manic state.  Her symptoms are more consistent with borderline personality disorder however.  I believe that she would benefit from a mood stabilizer; however, she was diametrically opposed to starting any medication.  She was quite irritable with the fact that she had been involuntarily committed, and also stated that she would not participate in groups.  Reassurance and supportive psychotherapy was provided.    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), Carpal tunnel syndrome, Cesarean delivery delivered (10/10/2020), Chlamydia, Chronic hypertension, Chronic post-traumatic stress disorder (PTSD) (03/10/2024), COVID-19 (09/12/2020), Genital HSV, GERD (gastroesophageal reflux disease), GERD without esophagitis (01/20/2013), Gestational diabetes, Goiter, unspecified (04/18/2010), H/O: cesarean section (09/27/2016), Headache(784.0), Herpes genitalis in women (04/18/2010), IBS (irritable bowel syndrome), Infection, Intrauterine pregnancy (03/22/2020), Panic attack, Premature rupture of membranes (PROM), onset of labor after 24 hours, antepartum, preterm, third trimester (10/10/2020), Pulmonary embolus (HCC) (10/18/2020), Thyroid  disease, Trichomonas, and Vitamin D  deficiency (03/10/2024).  Patient has a past psychiatric diagnosis of bipolar 1 disorder, GAD, and exhibits symptoms consistent with posttraumatic stress disorder and borderline personality disorder.  Though she denied  it on my  exam, she has a history of suicide attempt by jumping in front of a moving vehicle.  She reports that she used to be prescribed Xanax  and Lexapro.  Per chart review she has been prescribed Seroquel  50 mg and hydroxyzine  25 mg 3 times daily.  She told me that she was admitted in Mar 18, 2020 during COVID for "being in a mental state where I did not understand".  She also tells me that she think she has multiple personalities.  She states that she saw an outpatient therapist in Mar 18, 2013 after her grandmother died.  Is the patient at risk to self? yes Has the patient been a risk to self in the past 6 months? No Has the patient been a risk to self within the distant past? No Is the patient a risk to others? yes Has the patient been a risk to others in the past 6 months? No Has the patient been a risk to others within the distant past? No  [place] Scale:  Flowsheet Row [facility] (Current[facility] Most recent reading at 03/09/2024  6:42 PM ED from 03/09/2024 in [facility] Most recent reading at 03/09/2024  9:41 AM ED from 12/28/2023 in [facility] Emergency Department [facility] Most recent reading at 12/28/2023  1:50 PM  C-SSRS RISK CATEGORY No Risk No Risk No Risk          Prior Inpatient Therapy: Yes as above Prior Outpatient Therapy: Yes as above  Alcohol Screening:  1. How often do you have a drink containing alcohol?: Never 2. How many drinks containing alcohol do you have on a typical day when you are drinking?: 1 or 2 3. How often do you have six or more drinks on one occasion?: Never AUDIT-C Score: 0 4. How often during the last year have you found that you were not able to stop drinking once you had started?: Never 5. How often during the last year have you failed to do what was normally expected from you because of drinking?: Never 6. How often during the last year have you needed a first drink in the  morning to get yourself going after a heavy drinking session?: Never 7. How often during the last year have you had a feeling of guilt of remorse after drinking?: Never 8. How often during the last year have you been unable to remember what happened the night before because you had been drinking?: Never 9. Have you or someone else been injured as a result of your drinking?: No 10. Has a relative or friend or a doctor or another health worker been concerned about your drinking or suggested you cut down?: No Alcohol Use Disorder Identification Test Final Score (AUDIT): 0  Substance Abuse History in the last 12 months: Denies Consequences of Substance Abuse: NA  Previous Psychotropic Medications: Yes Psychological Evaluations: No  Past Medical History:  Past Medical History:  Diagnosis Date   Anemia    Anxiety    off meds with preg   Bipolar 1 disorder (HCC) 03/10/2024   Borderline personality disorder (HCC) 03/10/2024   Brief psychotic disorder (HCC) 04/03/2020   Carpal tunnel syndrome    Cesarean delivery delivered 10/10/2020   Chlamydia    Chronic hypertension    Chronic post-traumatic stress disorder (PTSD) 03/10/2024   COVID-19 09/12/2020   Genital HSV    Last OB: > 15yr ago; Valtrex  500 mg po prn outbreaks  GERD (gastroesophageal reflux disease)    GERD without esophagitis 01/20/2013   Gestational diabetes    diet controlled   Goiter, unspecified 04/18/2010   Centricity Description: THYROMEGALY  Qualifier: Diagnosis of   By: Earle Glatter, Scott      Centricity Description: GOITER  Qualifier: Diagnosis of   By: Earle Glatter, Scott      Replacing diagnoses that were inactivated after the 12/31/22 regulatory import     H/O: cesarean section 09/27/2016   last C-section for active HSV, C-section X1: 05/20/13; records in epic [facility]  TOLAC desired, signed 09/27/16      Headache(784.0)    Herpes genitalis in women 04/18/2010   Qualifier: Diagnosis of   By: Earle Glatter, Scott         IBS  (irritable bowel syndrome)    Infection    UTI   Intrauterine pregnancy 03/22/2020   EDC 11/14/2020     Panic attack    Premature rupture of membranes (PROM), onset of labor after 24 hours, antepartum, preterm, third trimester 10/10/2020   Pulmonary embolus (HCC) 10/18/2020   Thyroid  disease    Trichomonas    Vitamin D  deficiency 03/10/2024     Family Psychiatric & Medical History: The patient was evasive when providing a family history.  She told me that her mother has mental health problems but she is not sure what they are not sure if she takes medications.  She said the same thing about her father.  Per chart review mother has depression and anxiety.  Brother has bipolar disorder.  There is another family member with autism and schizophrenia. Family History  Problem Relation Age of Onset   Depression Mother    Anxiety disorder Mother    Breast cancer Other    Bipolar disorder Brother    Diabetes Maternal Grandmother    Cancer Maternal Grandmother    Hypertension Maternal Grandmother    Hypertension Paternal Grandmother    Autism Other    Schizophrenia Other      Tobacco Screening:  Social History   Tobacco Use  Smoking Status Passive Smoke Exposure - Never Smoker  Smokeless Tobacco Never  Tobacco Comments   was not a daily smoker, tried it      Social History:  Social History   Substance and Sexual Activity  Alcohol Use No   Comment: Twice a month      Additional Social History:  The patient was evasive when obtaining a social history.  She tells me that she was born in [place] and raised by her grandmother.  When asked why she was not raised by her mother and father she stated "I do not know they were just living their life".  She tells me that she has an older brother with whom she is close.  She states that she dropped out of high school as a freshman because she got pregnant.  She states that she has worked mostly in [employer] and that her longest history  of employment was approximately 6 or 7 months.  She tells me that she has been "self-employed" for 3 years.  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  She states that she last worked in 2020.  When asked what she does for finances she tells me that she gets section 8 housing and food stamps.    Allergies:   Allergies  Allergen Reactions   Latex Itching   Zithromax  [Azithromycin ] Hives and Nausea And Vomiting  Zofran  [Ondansetron ] Nausea Only     Lab Results:  Results for orders placed or performed during the hospital encounter of 03/09/24 (from the past 48 hours)  CBC with Differential/Platelet     Status: Abnormal   Collection Time: 03/09/24 11:24 AM  Result Value Ref Range   WBC 10.0 4.0 - 10.5 K/uL   RBC 5.35 (H) 3.87 - 5.11 MIL/uL   Hemoglobin 10.9 (L) 12.0 - 15.0 g/dL   HCT 16.1 09.6 - 04.5 %   MCV 68.4 (L) 80.0 - 100.0 fL   MCH 20.4 (L) 26.0 - 34.0 pg   MCHC 29.8 (L) 30.0 - 36.0 g/dL   RDW 40.9 (H) 81.1 - 91.4 %   Platelets 420 (H) 150 - 400 K/uL    Comment: REPEATED TO VERIFY   nRBC 0.0 0.0 - 0.2 %   Neutrophils Relative % 56 %   Neutro Abs 5.7 1.7 - 7.7 K/uL   Lymphocytes Relative 36 %   Lymphs Abs 3.6 0.7 - 4.0 K/uL   Monocytes Relative 5 %   Monocytes Absolute 0.5 0.1 - 1.0 K/uL   Eosinophils Relative 1 %   Eosinophils Absolute 0.1 0.0 - 0.5 K/uL   Basophils Relative 1 %   Basophils Absolute 0.1 0.0 - 0.1 K/uL   Immature Granulocytes 1 %   Abs Immature Granulocytes 0.11 (H) 0.00 - 0.07 K/uL    Comment: Performed at [facility], [address], [place], [place] [address]  Comprehensive metabolic panel     Status: Abnormal   Collection Time: 03/09/24 11:24 AM  Result Value Ref Range   Sodium 135 135 - 145 mmol/L   Potassium 3.3 (L) 3.5 - 5.1 mmol/L   Chloride 102 98 - 111 mmol/L   CO2 26 22 - 32 mmol/L   Glucose, Bld 120 (H) 70 - 99 mg/dL    Comment: Glucose reference range applies only to samples taken after  fasting for at least 8 hours.   BUN 8 6 - 20 mg/dL   Creatinine, Ser 6.21 0.44 - 1.00 mg/dL   Calcium  9.0 8.9 - 10.3 mg/dL   Total Protein 8.6 (H) 6.5 - 8.1 g/dL   Albumin 3.8 3.5 - 5.0 g/dL   AST 16 15 - 41 U/L   ALT 12 0 - 44 U/L   Alkaline Phosphatase 58 38 - 126 U/L   Total Bilirubin 1.0 0.0 - 1.2 mg/dL   GFR, Estimated >30 >86 mL/min    Comment: (NOTE) Calculated using the CKD-EPI Creatinine Equation (2021)    Anion gap 7 5 - 15    Comment: Performed at [facility], [address] [place], [place] [address]  Hemoglobin A1c     Status: Abnormal   Collection Time: 03/09/24 11:24 AM  Result Value Ref Range   Hgb A1c MFr Bld 6.3 (H) 4.8 - 5.6 %    Comment: (NOTE) Diagnosis of Diabetes The following HbA1c ranges recommended by the American Diabetes Association (ADA) may be used as an aid in the diagnosis of diabetes mellitus.  Hemoglobin             Suggested A1C NGSP%              Diagnosis  <5.7                   Non Diabetic  5.7-6.4                Pre-Diabetic  >6.4  Diabetic  <7.0                   Glycemic control for                       adults with diabetes.     Mean Plasma Glucose 134.11 mg/dL    Comment: Performed at [facility], [address] [place], [place] [address]  Lipid panel     Status: Abnormal   Collection Time: 03/09/24 11:24 AM  Result Value Ref Range   Cholesterol 142 0 - 200 mg/dL   Triglycerides 99 <604 mg/dL   HDL 29 (L) >54 mg/dL   Total CHOL/HDL Ratio 4.9 RATIO   VLDL 20 0 - 40 mg/dL   LDL Cholesterol 93 0 - 99 mg/dL    Comment:        Total Cholesterol/HDL:CHD Risk Coronary Heart Disease Risk Table                     Men   Women  1/2 Average Risk   3.4   3.3  Average Risk       5.0   4.4  2 X Average Risk   9.6   7.1  3 X Average Risk  23.4   11.0        Use the calculated Patient Ratio above and the CHD Risk Table to determine the patient's CHD Risk.        ATP III CLASSIFICATION (LDL):   <100     mg/dL   Optimal  098-119  mg/dL   Near or Above                    Optimal  130-159  mg/dL   Borderline  147-829  mg/dL   High  >562     mg/dL   Very High Performed at [facility], [address] [place], [place] [address]   TSH     Status: None   Collection Time: 03/09/24 11:24 AM  Result Value Ref Range   TSH 0.836 0.350 - 4.500 uIU/mL    Comment: Performed by a 3rd Generation assay with a functional sensitivity of <=0.01 uIU/mL. Performed at [facility], [address], [place], [place] [address]   Ethanol     Status: None   Collection Time: 03/09/24 11:24 AM  Result Value Ref Range   Alcohol, Ethyl (B) <15 <15 mg/dL    Comment: (NOTE) For medical purposes only. Performed at [facility], [address], [place], [place] [address]   VITAMIN D  25 Hydroxy (Vit-D Deficiency, Fractures)     Status: Abnormal   Collection Time: 03/09/24 11:24 AM  Result Value Ref Range   Vit D, 25-Hydroxy 6.53 (L) 30 - 100 ng/mL    Comment: (NOTE) Vitamin D  deficiency has been defined by the Institute of Medicine  and an Endocrine Society practice guideline as a level of serum 25-OH  vitamin D  less than 20 ng/mL (1,2). The Endocrine Society went on to  further define vitamin D  insufficiency as a level between 21 and 29  ng/mL (2).  1. IOM (Institute of Medicine). 2010. Dietary reference intakes for  calcium  and D. [place]: [employer]. 2. Holick MF, Binkley [place], Bischoff-Ferrari HA, et al. Evaluation,  treatment, and prevention of vitamin D  deficiency: an Endocrine  Society clinical practice guideline, JCEM. 2011 Jul; 96(7): 1911-30.  Performed at [facility], [address], [place], [place] [address]   POCT Urine Drug Screen - (I-Screen)     Status: Normal   Collection Time: 03/09/24 11:38 AM  Result Value Ref Range   POC Amphetamine UR None Detected    POC Secobarbital (BAR) None Detected    POC Buprenorphine (BUP) None  Detected    POC Oxazepam (BZO) None Detected    POC Cocaine UR None Detected    POC Methamphetamine UR None Detected    POC Morphine  None Detected    POC Methadone UR None Detected    POC Oxycodone  UR None Detected    POC Marijuana UR None Detected   Pregnancy, urine POC     Status: None   Collection Time: 03/09/24 11:40 AM  Result Value Ref Range   Preg Test, Ur NEGATIVE NEGATIVE    Comment:        THE SENSITIVITY OF THIS METHODOLOGY IS >24 mIU/mL   Urinalysis, Routine w reflex microscopic -Urine, Clean Catch     Status: Abnormal   Collection Time: 03/09/24  3:05 PM  Result Value Ref Range   Color, Urine AMBER (A) YELLOW    Comment: BIOCHEMICALS MAY BE AFFECTED BY COLOR   APPearance HAZY (A) CLEAR   Specific Gravity, Urine 1.025 1.005 - 1.030   pH 5.0 5.0 - 8.0   Glucose, UA NEGATIVE NEGATIVE mg/dL   Hgb urine dipstick LARGE (A) NEGATIVE   Bilirubin Urine NEGATIVE NEGATIVE   Ketones, ur NEGATIVE NEGATIVE mg/dL   Protein, ur 914 (A) NEGATIVE mg/dL   Nitrite NEGATIVE NEGATIVE   Leukocytes,Ua TRACE (A) NEGATIVE   RBC / HPF >50 0 - 5 RBC/hpf   WBC, UA 0-5 0 - 5 WBC/hpf   Bacteria, UA NONE SEEN NONE SEEN   Squamous Epithelial / HPF 0-5 0 - 5 /HPF   Mucus PRESENT     Comment: Performed at [facility], [address], [place], [place] [address]     Blood Alcohol level:  Lab Results  Component Value Date   [facility] <15 03/09/2024   ETH <10 11/26/2022    Metabolic Disorder Labs:  Lab Results  Component Value Date   HGBA1C 6.3 (H) 03/09/2024   MPG 134.11 03/09/2024   MPG 108.28 10/10/2020   No results found for: "PROLACTIN"  Lab Results  Component Value Date   CHOL 142 03/09/2024   TRIG 99 03/09/2024   HDL 29 (L) 03/09/2024   VLDL 20 03/09/2024   LDLCALC 93 03/09/2024      Current Medications: Current Facility-Administered Medications  Medication Dose Route Frequency Provider Last Rate Last Admin   acetaminophen  (TYLENOL ) tablet 650 mg  650 mg Oral Q6H  PRN Buena Carmine, NP       alum & mag hydroxide-simeth (MAALOX/MYLANTA) 200-200-20 MG/5ML suspension 30 mL  30 mL Oral Q4H PRN Buena Carmine, NP       ARIPiprazole (ABILIFY) tablet 5 mg  5 mg Oral QHS Wataru Mccowen S, MD       haloperidol (HALDOL) tablet 5 mg  5 mg Oral TID PRN Buena Carmine, NP       And   diphenhydrAMINE  (BENADRYL ) capsule 50 mg  50 mg Oral TID PRN Buena Carmine, NP       haloperidol lactate (HALDOL) injection 5 mg  5 mg Intramuscular TID PRN Buena Carmine, NP       And   diphenhydrAMINE  (BENADRYL ) injection 50 mg  50 mg Intramuscular TID PRN Buena Carmine, NP       And   LORazepam  (ATIVAN ) injection 2 mg  2 mg Intramuscular TID PRN Buena Carmine, NP       haloperidol lactate (HALDOL) injection 10 mg  10 mg Intramuscular TID PRN Buena Carmine, NP       And   diphenhydrAMINE  (BENADRYL ) injection 50 mg  50 mg Intramuscular TID PRN Buena Carmine, NP       And   LORazepam  (ATIVAN ) injection 2 mg  2 mg Intramuscular TID PRN Buena Carmine, NP       hydrOXYzine  (ATARAX ) tablet 25 mg  25 mg Oral TID PRN Yonathan Perrow S, MD       magnesium hydroxide (MILK OF MAGNESIA) suspension 30 mL  30 mL Oral Daily PRN Buena Carmine, NP       traZODone  (DESYREL ) tablet 50 mg  50 mg Oral QHS PRN Dorris Pierre S, MD       Vitamin D  (Ergocalciferol ) (DRISDOL ) 1.25 MG (50000 UNIT) capsule 50,000 Units  50,000 Units Oral Daily Trevor Wilkie S, MD        PTA Medications: No medications prior to admission.     Musculoskeletal: Strength & Muscle Tone: within normal limits Gait & Station: normal Patient leans: N/A    Psychiatric Specialty Exam:  Presentation  General Appearance: Fairly Groomed  Eye Contact: Fair  Speech: Slow  Speech Volume: Normal  Handedness: Right   Mood and Affect  Mood: Depressed; Anxious; Irritable; Dysphoric  Affect: Restricted; Congruent; Labile   Thought Process  Thought Processes:  Linear  Descriptions of Associations: Intact  Orientation: Partial  Thought Content: Illogical; Scattered  History of Schizophrenia/Schizoaffective disorder: No data recorded Duration of Psychotic Symptoms: NA Hallucinations: Hallucinations: None  Ideas of Reference: None  Suicidal Thoughts: Suicidal Thoughts: No  Homicidal Thoughts: Homicidal Thoughts: Yes, Passive HI Passive Intent and/or Plan: Without Intent; Without Means to [employer]  Memory: Recent Good; Immediate Fair  Judgment: Poor  Insight: Poor   Executive Functions  Concentration: Fair  Attention Span: Fair  Recall: Good  Fund of Knowledge: Good  Language: Good   Psychomotor Activity  Psychomotor Activity: Psychomotor Activity: Restlessness   Assets  Assets: Communication Skills; Housing; Physical Health   Sleep  Sleep: Sleep: Fair Number of Hours of Sleep: 8    Physical Exam: General: Sitting comfortably. NAD. HEENT: Normocephalic, atraumatic, MMM, EMOI Lungs: no increased work of breathing noted Heart: no cyanosis Abdomen: Non distended Musculoskeletal: FROM. No obvious deformities Skin: Warm, dry, intact. No rashes noted Neuro: No obvious focal deficits.  Gait and station are normal  Review of Systems  Constitutional: Negative.   HENT: Negative.    Eyes: Negative.   Respiratory: Negative.    Cardiovascular: Negative.   Gastrointestinal: Negative.   Genitourinary: Negative.   Skin: Negative.   Neurological: Negative.   Psychiatric/Behavioral:  Positive for depression, anxiety, irritability.     Blood pressure 129/82, pulse 91, temperature 98.6 F (37 C), temperature source Oral, resp. rate 17, height 5\' 7"  (1.702 m), weight 105.7 kg, SpO2 100%, unknown if currently breastfeeding. Body mass index is 36.49 kg/m.   Treatment Plan Summary: ASSESSMENT: Melinda Jimenez is an 38 y.o. 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), Carpal tunnel syndrome, Cesarean delivery delivered (10/10/2020), Chlamydia, Chronic hypertension, Chronic  post-traumatic stress disorder (PTSD) (03/10/2024), COVID-19 (09/12/2020), Genital HSV, GERD (gastroesophageal reflux disease), GERD without esophagitis (01/20/2013), Gestational diabetes, Goiter, unspecified (04/18/2010), H/O: cesarean section (09/27/2016), Headache(784.0), Herpes genitalis in women (04/18/2010), IBS (irritable bowel syndrome), Infection, Intrauterine pregnancy (03/22/2020), Panic attack, Premature rupture of membranes (PROM), onset of labor after 24 hours, antepartum, preterm, third trimester (10/10/2020), Pulmonary embolus (HCC) (10/18/2020), Thyroid  disease, Trichomonas, and Vitamin D  deficiency (03/10/2024).  She presented on 03/09/2024  4:35 PM for Bipolar 1 disorder, depressed, severe (HCC).  She arrived via EMS for "a mental breakdown" and homicidal ideation towards a DSS worker involved in taking away her custody of her children.  She refuses medications currently.  She does not meet criteria for in NE[facility].  Will continue to encourage group participation and medication compliance.  Diagnoses / Active Problems: Patient Active Problem List   Diagnosis Date Noted   PTSD (post-traumatic stress disorder) 03/10/2024   Borderline personality disorder (HCC) 03/10/2024   Prediabetes 03/10/2024   Bipolar 1 disorder, depressed, severe (HCC) 03/09/2024   Vitamin D  deficiency 04/19/2010   GAD (generalized anxiety disorder) 04/18/2010     PLAN: Safety and Monitoring:  -- Involuntary admission to inpatient psychiatric unit for safety, stabilization and treatment  -- Daily contact with patient to assess and evaluate symptoms and progress in treatment  -- Patient's case to be discussed in multi-disciplinary team meeting  -- Observation Level : q15 minute checks  -- Vital signs:   q12 hours  -- Precautions: suicide, elopement, and assault  2. Psychiatric Diagnoses and Treatment:  Patient Active Problem List   Diagnosis Date Noted   PTSD (post-traumatic stress disorder) 03/10/2024   Borderline personality disorder (HCC) 03/10/2024   Prediabetes 03/10/2024   Bipolar 1 disorder, depressed, severe (HCC) 03/09/2024   Vitamin D  deficiency 04/19/2010   GAD (generalized anxiety disorder) 04/18/2010     Scheduled Medications:  ARIPiprazole  5 mg Oral QHS   Vitamin D  (Ergocalciferol )  50,000 Units Oral Daily     As Needed Medications: acetaminophen , alum & mag hydroxide-simeth, haloperidol **AND** diphenhydrAMINE , haloperidol lactate **AND** diphenhydrAMINE  **AND** LORazepam , haloperidol lactate **AND** diphenhydrAMINE  **AND** LORazepam , hydrOXYzine , magnesium hydroxide, traZODone     3. Medical Issues Being Addressed:    Labs reviewed, remarkable for vitamin D  deficiency (6.53), hypokalemia 3.3, hyperproteinemia 8.6, anemia 10.9, prediabetes 6.3, elevated blood pressure 129/82    4. Discharge Planning:   -- Social work and case management to assist with discharge planning and identification of hospital follow-up needs prior to discharge  -- Estimated LOS: 5-7 days  -- Discharge Concerns: Need to establish a safety plan; Medication compliance and effectiveness  -- Discharge Goals: Return home with outpatient referrals for mental health follow-up including medication management/psychotherapy  5. Short Term Goals:  Improve ability to identify changes in lifestyle to reduce recurrence of condition, verbalize feelings, disclose and discuss suicidal ideas, demonstrate self-control, identify and develop effective coping behaviors, compliance with prescribed medications, identify triggers associated with substance abuse/mental health issues, participate in unit milieu and in scheduled group therapies   6. Long Term Goals: Improvement in symptoms so the patient is ready for  discharge   --The risks/benefits/side-effects/alternatives to the medications above were discussed in detail with the patient and time was given for questions. The patient provided informed consent.   -- Metabolic profile and EKG monitoring obtained while on an atypical antipsychotic and listed in the EHR    Total Time Spent in Direct Patient Care:  I personally spent 60 minutes on the unit in direct  patient care. The direct patient care time included face-to-face time with the patient, reviewing the patient's chart, communicating with other professionals, and coordinating care. Greater than 50% of this time was spent in counseling or coordinating care with the patient regarding goals of hospitalization, psycho-education, and discharge planning needs.   I certify that inpatient services furnished can reasonably be expected to improve the patient's condition.    Clair Crews, MD 03/10/2024, 1:27 PM      Portions of this note were created using voice recognition software. Minor syntax errors, grammatical content, spelling, or punctuation errors may have occurred unintentionally. Please notify the [facility] if the meaning of any statement is unclear.

## 2024-03-10 NOTE — Plan of Care (Signed)
  Problem: Coping: Goal: Ability to verbalize frustrations and anger appropriately will improve Outcome: Progressing   Problem: Safety: Goal: Periods of time without injury will increase Outcome: Progressing   Problem: Education: Goal: Knowledge of [facility] Education information/materials will improve Outcome: Not Progressing   Problem: Activity: Goal: Interest or engagement in activities will improve Outcome: Not Progressing

## 2024-03-11 ENCOUNTER — Encounter (HOSPITAL_COMMUNITY): Payer: Self-pay

## 2024-03-11 DIAGNOSIS — F314 Bipolar disorder, current episode depressed, severe, without psychotic features: Secondary | ICD-10-CM | POA: Diagnosis not present

## 2024-03-11 DIAGNOSIS — I10 Essential (primary) hypertension: Secondary | ICD-10-CM | POA: Insufficient documentation

## 2024-03-11 LAB — RPR: RPR Ser Ql: NONREACTIVE

## 2024-03-11 MED ORDER — VITAMIN D (ERGOCALCIFEROL) 1.25 MG (50000 UNIT) PO CAPS
50000.0000 [IU] | ORAL_CAPSULE | Freq: Every day | ORAL | Status: DC
  Administered 2024-03-12: 50000 [IU] via ORAL
  Filled 2024-03-11 (×3): qty 1

## 2024-03-11 MED ORDER — CLONIDINE HCL 0.1 MG PO TABS: 0.1000 mg | ORAL_TABLET | ORAL | Status: DC | PRN

## 2024-03-11 NOTE — Progress Notes (Signed)
 Keltie did not attend wrap up AA group.

## 2024-03-11 NOTE — Group Note (Signed)
 Date:  03/11/2024 Time:  1:25 AM  Group Topic/Focus:  Wrap-Up Group:   The focus of this group is to help patients review their daily goal of treatment and discuss progress on daily workbooks.    Participation Level:  Did Not Attend  Participation Quality:  N/A  Affect:  N/A  Cognitive:  N/A  Insight: None  Engagement in Group:  N/A  Modes of Intervention:  N/A  Additional Comments:  Patient did not attend wrap up group.  Dillard Frame 03/11/2024, 1:25 AM

## 2024-03-11 NOTE — BH IP Treatment Plan (Signed)
 Interdisciplinary Treatment and Diagnostic Plan Update  03/11/2024 Time of Session: 10:50 AM Melinda Jimenez MRN: [id number]  Principal Diagnosis: Bipolar 1 disorder, depressed, severe (HCC)  Secondary Diagnoses: Principal Problem:   Bipolar 1 disorder, depressed, severe (HCC) Active Problems:   Vitamin D  deficiency   GAD (generalized anxiety disorder)   PTSD (post-traumatic stress disorder)   Borderline personality disorder (HCC)   Prediabetes   Hypertension   Current Medications:  Current Facility-Administered Medications  Medication Dose Route Frequency Provider Last Rate Last Admin   acetaminophen  (TYLENOL ) tablet 650 mg  650 mg Oral Q6H PRN Buena Carmine, NP       alum & mag hydroxide-simeth (MAALOX/MYLANTA) 200-200-20 MG/5ML suspension 30 mL  30 mL Oral Q4H PRN Buena Carmine, NP       ARIPiprazole (ABILIFY) tablet 5 mg  5 mg Oral QHS Parker, Alvin S, MD       cloNIDine (CATAPRES) tablet 0.1 mg  0.1 mg Oral Q1H PRN Timmothy Foots, MD       haloperidol (HALDOL) tablet 5 mg  5 mg Oral TID PRN Buena Carmine, NP       And   diphenhydrAMINE  (BENADRYL ) capsule 50 mg  50 mg Oral TID PRN Buena Carmine, NP       haloperidol lactate (HALDOL) injection 5 mg  5 mg Intramuscular TID PRN Buena Carmine, NP       And   diphenhydrAMINE  (BENADRYL ) injection 50 mg  50 mg Intramuscular TID PRN Buena Carmine, NP       And   LORazepam  (ATIVAN ) injection 2 mg  2 mg Intramuscular TID PRN Buena Carmine, NP       haloperidol lactate (HALDOL) injection 10 mg  10 mg Intramuscular TID PRN Buena Carmine, NP       And   diphenhydrAMINE  (BENADRYL ) injection 50 mg  50 mg Intramuscular TID PRN Buena Carmine, NP       And   LORazepam  (ATIVAN ) injection 2 mg  2 mg Intramuscular TID PRN Buena Carmine, NP       hydrOXYzine  (ATARAX ) tablet 25 mg  25 mg Oral TID PRN Parker, Alvin S, MD       magnesium hydroxide (MILK OF MAGNESIA) suspension 30 mL  30 mL Oral Daily  PRN Buena Carmine, NP       traZODone  (DESYREL ) tablet 50 mg  50 mg Oral QHS PRN Parker, Alvin S, MD       [START ON 03/12/2024] Vitamin D  (Ergocalciferol ) (DRISDOL ) 1.25 MG (50000 UNIT) capsule 50,000 Units  50,000 Units Oral QHS Parker, Alvin S, MD       PTA Medications: No medications prior to admission.    Patient Stressors: Legal issue   Loss of children custody   Traumatic event    Patient Strengths: Ability for insight   Treatment Modalities: Medication Management, Group therapy, Case management,  1 to 1 session with clinician, Psychoeducation, Recreational therapy.   Physician Treatment Plan for Primary Diagnosis: Bipolar 1 disorder, depressed, severe (HCC) Long Term Goal(s):     Short Term Goals:    Medication Management: Evaluate patient's response, side effects, and tolerance of medication regimen.  Therapeutic Interventions: 1 to 1 sessions, Unit Group sessions and Medication administration.  Evaluation of Outcomes: Not Progressing  Physician Treatment Plan for Secondary Diagnosis: Principal Problem:   Bipolar 1 disorder, depressed, severe (HCC) Active Problems:   Vitamin D  deficiency   GAD (generalized anxiety disorder)  PTSD (post-traumatic stress disorder)   Borderline personality disorder (HCC)   Prediabetes   Hypertension  Long Term Goal(s):     Short Term Goals:       Medication Management: Evaluate patient's response, side effects, and tolerance of medication regimen.  Therapeutic Interventions: 1 to 1 sessions, Unit Group sessions and Medication administration.  Evaluation of Outcomes: Not Progressing   RN Treatment Plan for Primary Diagnosis: Bipolar 1 disorder, depressed, severe (HCC) Long Term Goal(s): Knowledge of disease and therapeutic regimen to maintain health will improve  Short Term Goals: Ability to remain free from injury will improve, Ability to verbalize frustration and anger appropriately will improve, Ability to demonstrate  self-control, Ability to participate in decision making will improve, Ability to verbalize feelings will improve, Ability to disclose and discuss suicidal ideas, Ability to identify and develop effective coping behaviors will improve, and Compliance with prescribed medications will improve  Medication Management: RN will administer medications as ordered by provider, will assess and evaluate patient's response and provide education to patient for prescribed medication. RN will report any adverse and/or side effects to prescribing provider.  Therapeutic Interventions: 1 on 1 counseling sessions, Psychoeducation, Medication administration, Evaluate responses to treatment, Monitor vital signs and CBGs as ordered, Perform/monitor CIWA, COWS, AIMS and Fall Risk screenings as ordered, Perform wound care treatments as ordered.  Evaluation of Outcomes: Not Progressing   LCSW Treatment Plan for Primary Diagnosis: Bipolar 1 disorder, depressed, severe (HCC) Long Term Goal(s): Safe transition to appropriate next level of care at discharge, Engage patient in therapeutic group addressing interpersonal concerns.  Short Term Goals: Engage patient in aftercare planning with referrals and resources, Increase social support, Increase ability to appropriately verbalize feelings, Increase emotional regulation, Facilitate acceptance of mental health diagnosis and concerns, Facilitate patient progression through stages of change regarding substance use diagnoses and concerns, Identify triggers associated with mental health/substance abuse issues, and Increase skills for wellness and recovery  Therapeutic Interventions: Assess for all discharge needs, 1 to 1 time with Social worker, Explore available resources and support systems, Assess for adequacy in community support network, Educate family and significant other(s) on suicide prevention, Complete Psychosocial Assessment, Interpersonal group therapy.  Evaluation of  Outcomes: Not Progressing   Progress in Treatment: Attending groups: No. Participating in groups: No. Taking medication as prescribed: Yes. Toleration medication: Yes. Family/Significant other contact made: consents are pending Patient understands diagnosis: Yes. Discussing patient identified problems/goals with staff: Yes. Medical problems stabilized or resolved: Yes. Denies suicidal/homicidal ideation: Yes. Issues/concerns per patient self-inventory: No.  New problem(s) identified:  No  New Short Term/Long Term Goal(s):    medication stabilization, elimination of SI thoughts, development of comprehensive mental wellness plan.   Patient Goals:  I'm feeling better.  I'm around more people in the hospital now, and that's different for me.    Discharge Plan or Barriers:  Patient recently admitted. CSW will continue to follow and assess for appropriate referrals and possible discharge planning.   Reason for Continuation of Hospitalization: Anxiety Homicidal ideation Medication stabilization  Estimated Length of Stay:  5 - 7 days  Last 3 [place] Suicide Severity Risk Score: Flowsheet Row [facility]Current[facility] Most recent reading at 03/09/2024  6:42 PM ED from 03/09/2024 in [facility] Most recent reading at 03/09/2024  9:41 AM ED from 12/28/2023 in [facility] at [facility] Most recent reading at 12/28/2023  1:50 PM  C-SSRS RISK CATEGORY  No Risk No Risk No Risk       Last PHQ 2/9 Scores:    02/17/2020   10:08 AM 05/14/2017    1:05 PM  Depression screen PHQ 2/9  Decreased Interest 3 0  Down, Depressed, Hopeless 3 0  PHQ - 2 Score 6 0  Altered sleeping 3 0  Tired, decreased energy 3 3  Change in appetite 1 0  Feeling bad or failure about yourself  3 0  Trouble concentrating 3 0  Moving slowly or fidgety/restless 2 0  Suicidal thoughts 2 0  PHQ-9 Score 23  3    Scribe for Treatment Team: Shaniah Baltes O Etherine Mackowiak, LCSWA 03/11/2024 6:50 PM

## 2024-03-11 NOTE — BHH Counselor (Deleted)
 Adult Comprehensive Assessment  Patient ID: Melinda Jimenez, female   DOB: [birth year], 38 y.o.   MRN: [id number]  Attempted PSA this morning, pt in bed stating I am extremely anxious I cannot talk right now. Will attempt again later today.       Vonzell Guerin. 03/11/2024

## 2024-03-11 NOTE — Progress Notes (Signed)
   03/11/24 1302  Psych Admission Type (Psych Patients Only)  Admission Status Involuntary  Psychosocial Assessment  Patient Complaints Irritability  Eye Contact Fair  Facial Expression Anxious  Affect Anxious  Speech Logical/coherent  Interaction Guarded  Motor Activity Fidgety  Appearance/Hygiene Unremarkable  Behavior Characteristics Calm  Mood Depressed;Irritable  Thought Process  Coherency WDL  Content WDL  Delusions None reported or observed  Perception WDL  Hallucination None reported or observed  Judgment Limited  Confusion None  Danger to Self  Current suicidal ideation? Denies  Agreement Not to Harm Self Yes  Description of Agreement Verbal  Danger to Others  Danger to Others None reported or observed

## 2024-03-11 NOTE — [facility] Group Notes (Signed)
 [facility] Group Notes:  (Nursing/MHT/Case Management/Adjunct)  Date:  03/11/2024  Time:  8:35 PM  Type of Therapy:  NA Group  Participation Level:  Did Not Attend  Participation Quality:    Affect:    Cognitive:    Insight:    Engagement in Group:    Modes of Intervention:    Summary of Progress/Problems: Didn't attend.  Alvaro Augusta 03/11/2024, 8:35 PM

## 2024-03-11 NOTE — Progress Notes (Signed)
 [facility] MD Progress Note  03/11/2024 1:57 PM Melinda Jimenez  MRN:  [id number] Principal Problem: Bipolar 1 disorder, depressed, severe (HCC) Diagnosis: Principal Problem:   Bipolar 1 disorder, depressed, severe (HCC) Active Problems:   Vitamin D  deficiency   GAD (generalized anxiety disorder)   PTSD (post-traumatic stress disorder)   Borderline personality disorder (HCC)   Prediabetes   Hypertension   ID & Admission Information: Melinda Jimenez is an 38 y.o. 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), Carpal tunnel syndrome, Cesarean delivery delivered (10/10/2020), Chlamydia, Chronic hypertension, Chronic post-traumatic stress disorder (PTSD) (03/10/2024), COVID-19 (09/12/2020), Genital HSV, GERD (gastroesophageal reflux disease), GERD without esophagitis (01/20/2013), Gestational diabetes, Goiter, unspecified (04/18/2010), H/O: cesarean section (09/27/2016), Headache(784.0), Herpes genitalis in women (04/18/2010), IBS (irritable bowel syndrome), Infection, Intrauterine pregnancy (03/22/2020), Panic attack, Premature rupture of membranes (PROM), onset of labor after 24 hours, antepartum, preterm, third trimester (10/10/2020), Pulmonary embolus (HCC) (10/18/2020), Thyroid  disease, Trichomonas, and Vitamin D  deficiency (03/10/2024).  She presented on 03/09/2024  4:35 PM for Bipolar 1 disorder, depressed, severe (HCC). She arrived via EMS for mental breakdown and homicidal ideation.  Her presentation is concerning for bipolar 1 mixed with psychotic features versus schizoaffective disorder bipolar type.  Subjective:   Case was discussed in the multidisciplinary team. MAR was reviewed and patient was not compliant with Abilify last night.  She is taking vitamin D .  PRN's in last 24 hours: None  Psychiatric Team made the following recommendations  yesterday: Start Abilify 5 mg nightly for mixed mania versus schizoaffective disorder bipolar type Started ergocalciferol  for vitamin D  deficiency  Patient was seen in the interview room during rounds.  She presented as guarded and paranoid.  She told me that she was scared of me and worried about see me again all night.  The LCSW reported that she reached out towards her in a bizarre manner during the treatment team meeting.  The patient continues to exhibit poor insight.  She is unable to tell me why she is in the hospital.  She is unable to accurately discuss events leading to her hospitalization.  She insists that she came in voluntarily despite our discussing her being involuntarily committed after she attempted to leave the facility due to paranoia.  She told me I do not like the way you were moving.  She made numerous illogical statements during the interview.  She reports asking group leaders for a certificate of completion with the intention of providing that to the courts who felt like she needed a psychiatric evaluation.  Psychoeducation was provided that this would not satisfy court requirements.  She was also advised that refusing to take medications recommended by her treatment team would likely not be reviewed favorably by the court system.  Psychoeducation and reassurance were provided.  The patient's diagnoses and rationale for the treatment regimen were discussed again in detail today.  That said, the patient showed poor comprehension or insight.  Patient's blood pressures were in the hypertensive range today.  Will add as needed clonidine to her regimen.  She states that ergocalciferol  is making her tired (which is not a recognized side effect of ergocalciferol , and is suggestive of paranoia), so we will move to bedtime.  We discussed continuing the patient's current treatment plan.    Past Psychiatric and Medical Medical History:  Past Medical History:  Diagnosis Date   Anemia  Anxiety    off meds with preg   Bipolar 1 disorder (HCC) 03/10/2024   Borderline personality disorder (HCC) 03/10/2024   Brief psychotic disorder (HCC) 04/03/2020   Carpal tunnel syndrome    Cesarean delivery delivered 10/10/2020   Chlamydia    Chronic hypertension    Chronic post-traumatic stress disorder (PTSD) 03/10/2024   COVID-19 09/12/2020   Genital HSV    Last OB: > 69yr ago; Valtrex  500 mg po prn outbreaks   GERD (gastroesophageal reflux disease)    GERD without esophagitis 01/20/2013   Gestational diabetes    diet controlled   Goiter, unspecified 04/18/2010   Centricity Description: THYROMEGALY  Qualifier: Diagnosis of   By: Earle Glatter, Scott      Centricity Description: GOITER  Qualifier: Diagnosis of   By: Earle Glatter, Scott      Replacing diagnoses that were inactivated after the 12/31/22 regulatory import     H/O: cesarean section 09/27/2016   last C-section for active HSV, C-section X1: 05/20/13; records in [facility]  TOLAC desired, signed 09/27/16      Headache(784.0)    Herpes genitalis in women 04/18/2010   Qualifier: Diagnosis of   By: Earle Glatter, Scott         IBS (irritable bowel syndrome)    Infection    UTI   Intrauterine pregnancy 03/22/2020   EDC 11/14/2020     Panic attack    Premature rupture of membranes (PROM), onset of labor after 24 hours, antepartum, preterm, third trimester 10/10/2020   Pulmonary embolus (HCC) 10/18/2020   Thyroid  disease    Trichomonas    Vitamin D  deficiency 03/10/2024    Past Surgical History:  Procedure Laterality Date   CESAREAN SECTION N/A 05/20/2013   Procedure: CESAREAN SECTION;  Surgeon: Gabrielle Joiner, MD;  Location: [facility]S;  Service: Obstetrics;  Laterality: N/A;   CESAREAN SECTION N/A 04/06/2017   Procedure: CESAREAN SECTION;  Surgeon: Othelia Blinks, MD;  Location: [facility] BIR[facility]ING SUIT[facility];  Service: Obstetrics;  Laterality: N/A;   CESAREAN SECTION  10/10/2020   Procedure: CESAREAN SECTION;  Surgeon: Abigail Abler,  MD;  Location: [facility] LD ORS;  Service: Obstetrics;;   INDUCED ABORTION  2009   VAGINAL DELIVERY  2007    Family History(Medical and Psychiatric):  Family History  Problem Relation Age of Onset   Depression Mother    Anxiety disorder Mother    Breast cancer Other    Bipolar disorder Brother    Diabetes Maternal Grandmother    Cancer Maternal Grandmother    Hypertension Maternal Grandmother    Hypertension Paternal Grandmother    Autism Other    Schizophrenia Other    Social History:  Social History   Substance and Sexual Activity  Alcohol Use No   Comment: Twice a month     Social History   Substance and Sexual Activity  Drug Use No    Social History   Socioeconomic History   Marital status: Single    Spouse name: Not on file   Number of children: 1   Years of education: Not on file   Highest education level: Not on file  Occupational History   Occupation: Unemployed  Tobacco Use   Smoking status: Passive Smoke Exposure - Never Smoker   Smokeless tobacco: Never   Tobacco comments:    was not a daily smoker, tried it  Advertising account planner   Vaping status: Never Used  Substance and Sexual Activity   Alcohol use: No  Comment: Twice a month   Drug use: No   Sexual activity: Yes    Birth control/protection: None    Comment: Patient is interested in implant  Other Topics Concern   Not on file  Social History Narrative   Not on file   Social Drivers of Health   Financial Resource Strain: Not on file  Food Insecurity: No Food Insecurity (03/09/2024)   Hunger Vital Sign    Worried About Running Out of Food in the Last Year: Never true    Ran Out of Food in the Last Year: Never true  Transportation Needs: No Transportation Needs (03/09/2024)   PRAPARE - Administrator, Civil Service (Medical): No    Lack of Transportation (Non-Medical): No  Physical Activity: Not on file  Stress: Not on file  Social Connections: Unknown (02/05/2022)   [facility] from [facility]   Social Network    Social Network: Not on file        Current Medications: Current Facility-Administered Medications  Medication Dose Route Frequency Provider Last Rate Last Admin   acetaminophen  (TYLENOL ) tablet 650 mg  650 mg Oral Q6H PRN Buena Carmine, NP       alum & mag hydroxide-simeth (MAALOX/MYLANTA) 200-200-20 MG/5ML suspension 30 mL  30 mL Oral Q4H PRN Buena Carmine, NP       ARIPiprazole (ABILIFY) tablet 5 mg  5 mg Oral QHS Sostenes Kauffmann S, MD       cloNIDine (CATAPRES) tablet 0.1 mg  0.1 mg Oral Q1H PRN Tavion Senkbeil S, MD       haloperidol (HALDOL) tablet 5 mg  5 mg Oral TID PRN Buena Carmine, NP       And   diphenhydrAMINE  (BENADRYL ) capsule 50 mg  50 mg Oral TID PRN Buena Carmine, NP       haloperidol lactate (HALDOL) injection 5 mg  5 mg Intramuscular TID PRN Buena Carmine, NP       And   diphenhydrAMINE  (BENADRYL ) injection 50 mg  50 mg Intramuscular TID PRN Buena Carmine, NP       And   LORazepam  (ATIVAN ) injection 2 mg  2 mg Intramuscular TID PRN Buena Carmine, NP       haloperidol lactate (HALDOL) injection 10 mg  10 mg Intramuscular TID PRN Buena Carmine, NP       And   diphenhydrAMINE  (BENADRYL ) injection 50 mg  50 mg Intramuscular TID PRN Buena Carmine, NP       And   LORazepam  (ATIVAN ) injection 2 mg  2 mg Intramuscular TID PRN Buena Carmine, NP       hydrOXYzine  (ATARAX ) tablet 25 mg  25 mg Oral TID PRN Jaslynne Dahan S, MD       magnesium hydroxide (MILK OF MAGNESIA) suspension 30 mL  30 mL Oral Daily PRN Buena Carmine, NP       traZODone  (DESYREL ) tablet 50 mg  50 mg Oral QHS PRN Kyilee Gregg S, MD       [START ON 03/12/2024] Vitamin D  (Ergocalciferol ) (DRISDOL ) 1.25 MG (50000 UNIT) capsule 50,000 Units  50,000 Units Oral QHS Timmothy Foots, MD        Lab Results:  Results for orders placed or performed during the hospital encounter of 03/09/24 (from the past 48 hours)  Folate      Status: None   Collection Time: 03/10/24  7:09 PM  Result Value Ref  Range   Folate 9.7 >5.9 ng/mL    Comment: Performed at [facility], [address], [place], [place] [address]  RPR     Status: None   Collection Time: 03/10/24  7:09 PM  Result Value Ref Range   RPR Ser Ql NON REACTIVE NON REACTIVE    Comment: Performed at [facility], [address] [place], [place] [address]  Vitamin B12     Status: None   Collection Time: 03/10/24  7:09 PM  Result Value Ref Range   Vitamin B-12 252 180 - 914 pg/mL    Comment: (NOTE) This assay is not validated for testing neonatal or myeloproliferative syndrome specimens for Vitamin B12 levels. Performed at [facility], [address], [place], [place] [address]   HIV Antibody (routine testing w rflx)     Status: None   Collection Time: 03/10/24  7:09 PM  Result Value Ref Range   HIV Screen 4th Generation wRfx Non Reactive Non Reactive    Comment: Performed at [facility], [address] [place], [place] [address]    Blood Alcohol level:  Lab Results  Component Value Date   [facility] <15 03/09/2024   ETH <10 11/26/2022    Metabolic Disorder Labs: Lab Results  Component Value Date   HGBA1C 6.3 (H) 03/09/2024   MPG 134.11 03/09/2024   MPG 108.28 10/10/2020   No results found for: PROLACTIN Lab Results  Component Value Date   CHOL 142 03/09/2024   TRIG 99 03/09/2024   HDL 29 (L) 03/09/2024   CHOLHDL 4.9 03/09/2024   VLDL 20 03/09/2024   LDLCALC 93 03/09/2024    Physical Findings: AIMS:  , ,  ,  ,    CIWA:    COWS:     Psychiatric Specialty Exam:  Presentation  General Appearance: Fairly Groomed  Eye Contact: Good  Speech: Clear and Coherent  Speech Volume: Normal  Handedness: Right   Mood and Affect  Mood: Anxious; Irritable  Affect: Inappropriate; Restricted   Thought Process  Thought Processes: Disorganized; Irrevelant  Descriptions of  Associations: Circumstantial  Orientation: Partial  Thought Content: Illogical; Paranoid Ideation; Scattered  History of Schizophrenia/Schizoaffective disorder: No  Duration of Psychotic Symptoms: NA Hallucinations: Hallucinations: None  Ideas of Reference: None  Suicidal Thoughts: Suicidal Thoughts: No  Homicidal Thoughts: Homicidal Thoughts: No HI Passive Intent and/or Plan: Without Intent; Without Means to [employer]  Memory: Immediate Fair; Recent Fair  Judgment: Poor  Insight: Poor   Executive Functions  Concentration: Fair  Attention Span: Fair  Recall: Fair  Fund of Knowledge: Good  Language: Good   Psychomotor Activity  Psychomotor Activity: Psychomotor Activity: Normal   Assets  Assets: Communication Skills; Social Support; Housing   Sleep  Sleep: Sleep: Fair   Musculoskeletal: Strength & Muscle Tone: within normal limits Gait & Station: normal Patient leans: N/A   Physical Exam: General: Sitting comfortably. NAD. HEENT: Normocephalic, atraumatic, MMM, EMOI Lungs: no increased work of breathing noted Heart: no cyanosis Abdomen: Non distended Musculoskeletal: FROM. No obvious deformities Skin: Warm, dry, intact. No rashes noted Neuro: No obvious focal deficits.  Gait and station are normal  Review of Systems  Constitutional: Negative.   HENT: Negative.    Eyes: Negative.   Respiratory: Negative.    Cardiovascular: Negative.   Gastrointestinal: Negative.   Genitourinary: Negative.   Skin: Negative.   Neurological: Negative.   Psychiatric/Behavioral:  Positive for paranoia, disorganized thoughts.  Blood pressure (!) 124/105, pulse (!) 105, temperature 98.5 F (36.9 C), temperature source Oral, resp. rate 16, height 5' 7 (1.702 m), weight 105.7 kg, SpO2 97%, unknown if currently breastfeeding. Body mass index is 36.49 kg/m.  ASSESSMENT: Melinda Jimenez is an 38 y.o. 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/03/2020), Carpal tunnel syndrome, Cesarean delivery delivered (10/10/2020), Chlamydia, Chronic hypertension, Chronic post-traumatic stress disorder (PTSD) (03/10/2024), COVID-19 (09/12/2020), Genital HSV, GERD (gastroesophageal reflux disease), GERD without esophagitis (01/20/2013), Gestational diabetes, Goiter, unspecified (04/18/2010), H/O: cesarean section (09/27/2016), Headache(784.0), Herpes genitalis in women (04/18/2010), IBS (irritable bowel syndrome), Infection, Intrauterine pregnancy (03/22/2020), Panic attack, Premature rupture of membranes (PROM), onset of labor after 24 hours, antepartum, preterm, third trimester (10/10/2020), Pulmonary embolus (HCC) (10/18/2020), Thyroid  disease, Trichomonas, and Vitamin D  deficiency (03/10/2024).  She presented on 03/09/2024  4:35 PM for Bipolar 1 disorder, depressed, severe (HCC).  She arrived via EMS and was involuntarily committed at the outside facility.  She remained guarded and paranoid and noncompliant with medications at this time.  Diagnoses / Active Problems: Patient Active Problem List   Diagnosis Date Noted   Hypertension 03/11/2024   PTSD (post-traumatic stress disorder) 03/10/2024   Borderline personality disorder (HCC) 03/10/2024   Prediabetes 03/10/2024   Bipolar 1 disorder, depressed, severe (HCC) 03/09/2024   Vitamin D  deficiency 04/19/2010   GAD (generalized anxiety disorder) 04/18/2010      PLAN: Safety and Monitoring:  -- Involuntary admission to inpatient psychiatric unit for safety, stabilization and treatment  -- Daily contact with patient to assess and evaluate symptoms and progress in treatment  -- Patient's case to be discussed in multi-disciplinary team meeting  -- Observation Level : q15 minute checks  -- Vital signs:  q12 hours  -- Precautions: suicide, elopement, and assault  2.  Psychiatric Diagnoses and Treatment:  Patient Active Problem List   Diagnosis Date Noted   Hypertension 03/11/2024   PTSD (post-traumatic stress disorder) 03/10/2024   Borderline personality disorder (HCC) 03/10/2024   Prediabetes 03/10/2024   Bipolar 1 disorder, depressed, severe (HCC) 03/09/2024   Vitamin D  deficiency 04/19/2010   GAD (generalized anxiety disorder) 04/18/2010     Scheduled Medications:  ARIPiprazole  5 mg Oral QHS   [START ON 03/12/2024] Vitamin D  (Ergocalciferol )  50,000 Units Oral QHS    As Needed Medications: acetaminophen , alum & mag hydroxide-simeth, cloNIDine, haloperidol **AND** diphenhydrAMINE , haloperidol lactate **AND** diphenhydrAMINE  **AND** LORazepam , haloperidol lactate **AND** diphenhydrAMINE  **AND** LORazepam , hydrOXYzine , magnesium hydroxide, traZODone     3. Medical Issues Being Addressed:   -- Vitamin D  deficiency as above  Labs reviewed, unremarkable with the exception of: Vitamin D  deficiency 6.53, anemia 10.9, hemoglobin A1c 6.3  4. Discharge Planning:   -- Social work and case management to assist with discharge planning and identification of hospital follow-up needs prior to discharge  -- Estimated LOS: 7 to 10 days  -- Discharge Concerns: Need to establish a safety plan; Medication compliance and effectiveness  -- Discharge Goals: Return home with outpatient referrals for mental health follow-up including medication management/psychotherapy  5. Short Term Goals:  Improve ability to identify changes in lifestyle to reduce recurrence of condition, verbalize feelings, disclose and discuss suicidal ideas, demonstrate self-control, identify and develop effective coping behaviors, compliance with prescribed medications, identify triggers associated with substance abuse/mental health issues, participate in unit milieu and in scheduled group therapies   6. Long Term Goals: Improvement  in symptoms so the patient is ready for discharge   --The  risks/benefits/side-effects/alternatives to the medications above were discussed in detail with the patient and time was given for questions. The patient provided informed consent.   -- Metabolic profile and EKG monitoring obtained while on an atypical antipsychotic and listed in the EHR    Total Time Spent in Direct Patient Care:  I personally spent 35 minutes on the unit in direct patient care. The direct patient care time included face-to-face time with the patient, reviewing the patient's chart, communicating with other professionals, and coordinating care. Greater than 50% of this time was spent in counseling or coordinating care with the patient regarding goals of hospitalization, psycho-education, and discharge planning needs.      Clair Crews, MD Psychiatrist  03/11/2024, 1:57 PM   I certify that inpatient services furnished can reasonably be expected to improve the patient's condition.    Portions of this note were created using voice recognition software. Minor syntax errors, grammatical content, spelling, or punctuation errors may have occurred unintentionally. Please notify the [facility] if the meaning of any statement is unclear.

## 2024-03-11 NOTE — Plan of Care (Signed)
  Problem: Education: Goal: Emotional status will improve Outcome: Progressing   Problem: Activity: Goal: Sleeping patterns will improve Outcome: Progressing   Problem: Coping: Goal: Ability to demonstrate self-control will improve Outcome: Progressing   Problem: Education: Goal: Mental status will improve Outcome: Not Progressing   Problem: Activity: Goal: Interest or engagement in activities will improve Outcome: Not Progressing

## 2024-03-11 NOTE — Progress Notes (Signed)
   03/10/24 2202  Psych Admission Type (Psych Patients Only)  Admission Status Involuntary  Psychosocial Assessment  Patient Complaints None  Eye Contact Fair  Facial Expression Anxious  Affect Anxious  Speech Logical/coherent  Interaction Guarded  Motor Activity Fidgety  Appearance/Hygiene Unremarkable  Behavior Characteristics Calm  Mood Depressed;Irritable  Thought Process  Coherency WDL  Content WDL  Delusions None reported or observed  Perception WDL  Hallucination None reported or observed  Judgment Limited  Confusion None  Danger to Self  Current suicidal ideation? Denies  Agreement Not to Harm Self Yes  Description of Agreement Verbal  Danger to Others  Danger to Others None reported or observed

## 2024-03-11 NOTE — Plan of Care (Signed)
   Problem: Education: Goal: Emotional status will improve Outcome: Progressing Goal: Mental status will improve Outcome: Progressing Goal: Verbalization of understanding the information provided will improve Outcome: Progressing

## 2024-03-11 NOTE — Progress Notes (Signed)
   03/11/24 2000  Psych Admission Type (Psych Patients Only)  Admission Status Involuntary  Psychosocial Assessment  Patient Complaints Irritability  Eye Contact Fair  Facial Expression Anxious  Affect Anxious  Speech Logical/coherent  Interaction Guarded  Motor Activity Fidgety  Appearance/Hygiene Unremarkable  Behavior Characteristics Appropriate to situation  Mood Irritable;Depressed  Aggressive Behavior  Effect No apparent injury  Thought Process  Coherency WDL  Content WDL  Delusions WDL  Perception WDL  Hallucination None reported or observed  Judgment WDL  Confusion WDL  Danger to Self  Current suicidal ideation? Denies  Danger to Others  Danger to Others None reported or observed

## 2024-03-11 NOTE — Plan of Care (Signed)
  Problem: Education: Goal: Emotional status will improve Outcome: Progressing Goal: Mental status will improve Outcome: Progressing Goal: Verbalization of understanding the information provided will improve Outcome: Progressing   Problem: Safety: Goal: Periods of time without injury will increase Outcome: Progressing   

## 2024-03-11 NOTE — Group Note (Signed)
 Recreation Therapy Group Note   Group Topic:Team Building  Group Date: 03/11/2024 Start Time: 0945 End Time: 1005 Facilitators: Braxon Suder-McCall, LRT,CTRS Location: [address] Day[address]   Group Topic: Communication, Team Building, Problem Solving  Goal Area(s) Addresses:  Patient will effectively work with peer towards shared goal.  Patient will identify skills used to make activity successful.  Patient will identify how skills used during activity can be used to reach post d/c goals.   Behavioral Response:   Intervention: STEM Activity  Activity: Landing Pad. In teams of 3-5, patients were given 12 plastic drinking straws and an equal length of masking tape. Using the materials provided, patients were asked to build a landing pad to catch a golf ball dropped from approximately 5 feet in the air. All materials were required to be used by the team in their design. LRT facilitated post-activity discussion.  Education: Pharmacist, community, Scientist, physiological, Discharge Planning   Education Outcome: Acknowledges education/In group clarification offered/Needs additional education.    Affect/Mood: N/A   Participation Level: Did not attend    Clinical Observations/Individualized Feedback:     Plan: Continue to engage patient in RT group sessions 2-3x/week.   Shalanda Brogden-McCall, LRT,CTRS 03/11/2024 1:34 PM

## 2024-03-12 DIAGNOSIS — F314 Bipolar disorder, current episode depressed, severe, without psychotic features: Secondary | ICD-10-CM | POA: Diagnosis not present

## 2024-03-12 MED ORDER — ARIPIPRAZOLE 10 MG PO TABS
10.0000 mg | ORAL_TABLET | Freq: Every day | ORAL | Status: DC
  Administered 2024-03-12 – 2024-03-14 (×3): 10 mg via ORAL
  Filled 2024-03-12 (×4): qty 1

## 2024-03-12 MED ORDER — AMLODIPINE BESYLATE 5 MG PO TABS
5.0000 mg | ORAL_TABLET | Freq: Every day | ORAL | Status: DC
  Administered 2024-03-13: 5 mg via ORAL
  Filled 2024-03-12: qty 1

## 2024-03-12 NOTE — Group Note (Signed)
 LCSW Group Therapy Note   Group Date: 03/12/2024 Start Time: 1100 End Time: 1200   Participation:  did not attend  Type of Therapy:  Group Therapy   Topic:  Stronger Together:  Building Healthy Relationships  Objective:  To explore loneliness, boundaries, and safe ways to build relationships.  Goals: Recognize healthy vs. unhealthy relationships. Learn safe ways to connect with others. Strengthen communication and Melinda Jimenez.  Summary:  Participants discussed loneliness, healthy connections, and setting boundaries. They explored safe ways to meet people and shared personal experiences. Key insights were reinforced through discussion and quotes.  Therapeutic Modalities Used: Cognitive Behavioral Therapy (CBT) Elements - Identifying unhealthy relationship patterns, challenging negative thoughts about connection. Dialectical Behavior Therapy (DBT) Elements - Interpersonal effectiveness, setting and maintaining boundaries. Supportive Group Therapy - Peer discussion, shared experiences, and emotional validation.   Melinda Jimenez O Melinda Jimenez, LCSWA 03/12/2024  4:59 PM

## 2024-03-12 NOTE — Plan of Care (Signed)

## 2024-03-12 NOTE — Progress Notes (Signed)
 Patient is alert, oriented, and cooperative. Denies SI, HI, AVH, and verbally contracts for safety. Patient reports she slept well last night better than the night before. Patient seems suspicious of this RN.    Patient allowed to get numbers out of her phone to work on getting bills paid per MD order.    Support provided. Patient educated on safety on the unit and medications. Routine safety checks every 15 minutes. Patient stated understanding to tell nurse about any new physical symptoms. Patient understands to tell staff of any needs.     No adverse drug reactions noted. Patient remains safe at this time and will continue to monitor.    03/12/24 0900  Psych Admission Type (Psych Patients Only)  Admission Status Involuntary  Psychosocial Assessment  Patient Complaints Suspiciousness  Eye Contact Fair;Suspiciousness  Facial Expression Animated;Anxious  Affect Anxious  Speech Logical/coherent  Interaction Assertive  Motor Activity Fidgety  Appearance/Hygiene Unremarkable  Behavior Characteristics Cooperative;Calm  Mood Depressed;Pleasant;Suspicious  Thought Process  Coherency WDL  Content WDL  Delusions None reported or observed  Perception WDL  Hallucination None reported or observed  Judgment Limited  Confusion None  Danger to Self  Current suicidal ideation? Denies  Self-Injurious Behavior No self-injurious ideation or behavior indicators observed or expressed   Agreement Not to Harm Self Yes  Description of Agreement verbal  Danger to Others  Danger to Others None reported or observed

## 2024-03-12 NOTE — Progress Notes (Signed)
 [facility] Melinda Jimenez Progress Note  03/12/2024 12:36 PM Melinda Jimenez  MRN:  [id number] Principal Problem: Bipolar 1 disorder, depressed, severe (HCC) Diagnosis: Principal Problem:   Bipolar 1 disorder, depressed, severe (HCC) Active Problems:   Vitamin D  deficiency   GAD (generalized anxiety disorder)   PTSD (post-traumatic stress disorder)   Borderline personality disorder (HCC)   Prediabetes   Hypertension   ID & Admission Information: Melinda Jimenez is an 38 y.o. 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), Carpal tunnel syndrome, Cesarean delivery delivered (10/10/2020), Chlamydia, Chronic hypertension, Chronic post-traumatic stress disorder (PTSD) (03/10/2024), COVID-19 (09/12/2020), Genital HSV, GERD (gastroesophageal reflux disease), GERD without esophagitis (01/20/2013), Gestational diabetes, Goiter, unspecified (04/18/2010), H/O: cesarean section (09/27/2016), Headache(784.0), Herpes genitalis in women (04/18/2010), IBS (irritable bowel syndrome), Infection, Intrauterine pregnancy (03/22/2020), Panic attack, Premature rupture of membranes (PROM), onset of labor after 24 hours, antepartum, preterm, third trimester (10/10/2020), Pulmonary embolus (HCC) (10/18/2020), Thyroid  disease, Trichomonas, and Vitamin D  deficiency (03/10/2024).  She presented on 03/09/2024  4:35 PM for Bipolar 1 disorder, depressed, severe (HCC). She arrived via EMS for mental breakdown and homicidal ideation.  Her presentation is concerning for bipolar 1 mixed with psychotic features versus schizoaffective disorder bipolar type.  Subjective:   Case was discussed in the multidisciplinary team. MAR was reviewed and patient was compliant with Abilify last night.    PRN's in last 24 hours: None  Psychiatric Team made the following recommendations yesterday: Continue Abilify 5 mg  nightly for mixed mania versus schizoaffective disorder bipolar type (patient was not compliant until last night) Started ergocalciferol  for vitamin D  deficiency  The patient was seen in her room during rounds.  She continues to present as paranoid, and presents as feeling unsafe in the hospital even though she denies these concerns.  Paranoia appears to be better masked today than on previous days.  Much of the interview was spent bargaining on decreasing days of hospitalization.  Psychoeducation was provided on the time frames of efficacy of medications, and the need for monitoring while medicines are adjusted.  We discussed necessary lifestyle modifications due to elevated hemoglobin A1c.  As it has been very difficult to get her to take Abilify and vitamin D , I broached the subject of starting a blood pressure medicine if her pressures remain elevated.  The patient prefers lifestyle modifications at this time, but is willing to consider blood pressure medicine if pressures remain elevated.  She denies suicidal ideation, homicidal ideation, auditory hallucinations, or visual hallucinations.  That said, she is known to be an unreliable historian.  Will monitor closely for decrease in paranoia and improvement in disorganized thought.  Past Psychiatric and Medical Medical History:  Past Medical History:  Diagnosis Date   Anemia    Anxiety    off meds with preg   Bipolar 1 disorder (HCC) 03/10/2024   Borderline personality disorder (HCC) 03/10/2024   Brief psychotic disorder (HCC) 04/03/2020   Carpal tunnel syndrome    Cesarean delivery delivered 10/10/2020   Chlamydia    Chronic hypertension    Chronic post-traumatic stress disorder (PTSD) 03/10/2024   COVID-19 09/12/2020   Genital HSV    Last OB: > 37yr ago; Melinda Jimenez  500 mg po prn outbreaks   GERD (gastroesophageal reflux disease)    GERD without esophagitis 01/20/2013   Gestational diabetes    diet controlled   Goiter, unspecified  04/18/2010   Centricity  Description: THYROMEGALY  Qualifier: Diagnosis of   By: Melinda Jimenez, Melinda Jimenez      Centricity Description: GOITER  Qualifier: Diagnosis of   By: Melinda Jimenez, Melinda Jimenez      Replacing diagnoses that were inactivated after the 12/31/22 regulatory import     H/O: cesarean section 09/27/2016   last C-section for active HSV, C-section X1: 05/20/13; records in [facility]  TOLAC desired, signed 09/27/16      Headache(784.0)    Herpes genitalis in women 04/18/2010   Qualifier: Diagnosis of   By: Melinda Jimenez, Melinda Jimenez         IBS (irritable bowel syndrome)    Infection    UTI   Intrauterine pregnancy 03/22/2020   EDC 11/14/2020     Panic attack    Premature rupture of membranes (PROM), onset of labor after 24 hours, antepartum, preterm, third trimester 10/10/2020   Pulmonary embolus (HCC) 10/18/2020   Thyroid  disease    Trichomonas    Vitamin D  deficiency 03/10/2024    Past Surgical History:  Procedure Laterality Date   CESAREAN SECTION N/A 05/20/2013   Procedure: CESAREAN SECTION;  Surgeon: Melinda Joiner, Melinda Jimenez;  Location: [facility];  Service: Obstetrics;  Laterality: N/A;   CESAREAN SECTION N/A 04/06/2017   Procedure: CESAREAN SECTION;  Surgeon: Melinda Blinks, Melinda Jimenez;  Location: [facility]IR[facility] SU[facility];  Service: Obstetrics;  Laterality: N/A;   CESAREAN SECTION  10/10/2020   Procedure: CESAREAN SECTION;  Surgeon: Melinda Abler, Melinda Jimenez;  Location: [facility] ORS;  Service: Obstetrics;;   INDUCED ABORTION  2009   VAGINAL DELIVERY  2007    Family History(Medical and Psychiatric):  Family History  Problem Relation Age of Onset   Depression Mother    Anxiety disorder Mother    Breast cancer Other    Bipolar disorder Brother    Diabetes Maternal Grandmother    Cancer Maternal Grandmother    Hypertension Maternal Grandmother    Hypertension Paternal Grandmother    Autism Other    Schizophrenia Other    Social History:  Social History   Substance and Sexual Activity  Alcohol Use No    Comment: Twice a month     Social History   Substance and Sexual Activity  Drug Use No    Social History   Socioeconomic History   Marital status: Single    Spouse name: Not on file   Number of children: 1   Years of education: Not on file   Highest education level: Not on file  Occupational History   Occupation: Unemployed  Tobacco Use   Smoking status: Passive Smoke Exposure - Never Smoker   Smokeless tobacco: Never   Tobacco comments:    was not a daily smoker, tried it  Advertising account planner   Vaping status: Never Used  Substance and Sexual Activity   Alcohol use: No    Comment: Twice a month   Drug use: No   Sexual activity: Yes    Birth control/protection: None    Comment: Patient is interested in implant  Other Topics Concern   Not on file  Social History Narrative   Not on file   Social Drivers of Health   Financial Resource Strain: Not on file  Food Insecurity: No Food Insecurity (03/09/2024)   Hunger Vital Sign    Worried About Running Out of Food in the Last Year: Never true    Ran Out of Food in the Last Year: Never true  Transportation Needs: No Transportation Needs (03/09/2024)  PRAPARE - Administrator, Civil Service (Medical): No    Lack of Transportation (Non-Medical): No  Physical Activity: Not on file  Stress: Not on file  Social Connections: Unknown (02/05/2022)   Received from [facility]   Social Network    Social Network: Not on file        Current Medications: Current Facility-Administered Medications  Medication Dose Route Frequency Provider Last Rate Last Admin   acetaminophen  (TYLENOL ) tablet 650 mg  650 mg Oral Q6H PRN Buena Carmine, NP       alum & mag hydroxide-simeth (MAALOX/MYLANTA) 200-200-20 MG/5ML suspension 30 mL  30 mL Oral Q4H PRN Buena Carmine, NP       [START ON 03/13/2024] amLODipine (NORVASC) tablet 5 mg  5 mg Oral Daily Jissel Slavens S, Melinda Jimenez       ARIPiprazole (ABILIFY) tablet 10 mg  10 mg Oral QHS Ouita Nish,  Alyra Patty S, Melinda Jimenez       cloNIDine (CATAPRES) tablet 0.1 mg  0.1 mg Oral Q1H PRN Latanya Hemmer S, Melinda Jimenez       haloperidol (HALDOL) tablet 5 mg  5 mg Oral TID PRN Buena Carmine, NP       And   diphenhydrAMINE  (BENADRYL ) capsule 50 mg  50 mg Oral TID PRN Buena Carmine, NP       haloperidol lactate (HALDOL) injection 5 mg  5 mg Intramuscular TID PRN Buena Carmine, NP       And   diphenhydrAMINE  (BENADRYL ) injection 50 mg  50 mg Intramuscular TID PRN Buena Carmine, NP       And   LORazepam  (ATIVAN ) injection 2 mg  2 mg Intramuscular TID PRN Buena Carmine, NP       haloperidol lactate (HALDOL) injection 10 mg  10 mg Intramuscular TID PRN Buena Carmine, NP       And   diphenhydrAMINE  (BENADRYL ) injection 50 mg  50 mg Intramuscular TID PRN Buena Carmine, NP       And   LORazepam  (ATIVAN ) injection 2 mg  2 mg Intramuscular TID PRN Buena Carmine, NP       hydrOXYzine  (ATARAX ) tablet 25 mg  25 mg Oral TID PRN Dusty Wagoner S, Melinda Jimenez       magnesium hydroxide (MILK OF MAGNESIA) suspension 30 mL  30 mL Oral Daily PRN Buena Carmine, NP       traZODone  (DESYREL ) tablet 50 mg  50 mg Oral QHS PRN Lonnetta Kniskern S, Melinda Jimenez       Vitamin D  (Ergocalciferol ) (DRISDOL ) 1.25 MG (50000 UNIT) capsule 50,000 Units  50,000 Units Oral QHS Timmothy Foots, Melinda Jimenez        Lab Results:  Results for orders placed or performed during the hospital encounter of 03/09/24 (from the past 48 hours)  Folate     Status: None   Collection Time: 03/10/24  7:09 PM  Result Value Ref Range   Folate 9.7 >5.9 ng/mL    Comment: Performed at [facility], [address], [place], [place] [address]  RPR     Status: None   Collection Time: 03/10/24  7:09 PM  Result Value Ref Range   RPR Ser Ql NON REACTIVE NON REACTIVE    Comment: Performed at [facility], [address], [place], [place] [address]  Vitamin B12     Status: None   Collection Time: 03/10/24  7:09 PM  Result Value Ref  Range   Vitamin B-12 252 180 - 914 pg/mL    Comment: (NOTE) This assay is not validated for testing neonatal or myeloproliferative syndrome specimens for Vitamin B12 levels. Performed at [facility], [address] [place], [place] [address]   HIV Antibody (routine testing w rflx)     Status: None   Collection Time: 03/10/24  7:09 PM  Result Value Ref Range   HIV Screen 4th Generation wRfx Non Reactive Non Reactive    Comment: Performed at [facility], [address] [place], [place] [address]    Blood Alcohol level:  Lab Results  Component Value Date   [facility] <15 03/09/2024   ETH <10 11/26/2022    Metabolic Disorder Labs: Lab Results  Component Value Date   HGBA1C 6.3 (H) 03/09/2024   MPG 134.11 03/09/2024   MPG 108.28 10/10/2020   No results found for: PROLACTIN Lab Results  Component Value Date   CHOL 142 03/09/2024   TRIG 99 03/09/2024   HDL 29 (L) 03/09/2024   CHOLHDL 4.9 03/09/2024   VLDL 20 03/09/2024   LDLCALC 93 03/09/2024    Physical Findings: AIMS:  , ,  ,  ,    CIWA:    COWS:     Psychiatric Specialty Exam:  Presentation  General Appearance: Fairly Groomed  Eye Contact: Fair  Speech: Clear and Coherent (Increased in rate, tone, and volume)  Speech Volume: Increased  Handedness: Right   Mood and Affect  Mood: Depressed; Anxious  Affect: Congruent; Inappropriate; Restricted   Thought Process  Thought Processes: Linear  Descriptions of Associations: Intact  Orientation: Full (Time, Place and Person)  Thought Content: Illogical; Paranoid Ideation  History of Schizophrenia/Schizoaffective disorder: No  Duration of Psychotic Symptoms: NA Hallucinations: Hallucinations: None  Ideas of Reference: None  Suicidal Thoughts: Suicidal Thoughts: No  Homicidal Thoughts: Homicidal Thoughts: No   Sensorium  Memory: Immediate Good  Judgment: Poor  Insight: Poor   Executive Functions  Concentration:  Fair  Attention Span: Fair  Recall: Fair  Fund of Knowledge: Good  Language: Good   Psychomotor Activity  Psychomotor Activity: Psychomotor Activity: Restlessness   Assets  Assets: Communication Skills; Housing   Sleep  Sleep: Sleep: Good   Musculoskeletal: Strength & Muscle Tone: within normal limits Gait & Station: normal Patient leans: N/A   Physical Exam: General: Sitting comfortably. NAD. HEENT: Normocephalic, atraumatic, MMM, EMOI Lungs: no increased work of breathing noted Heart: no cyanosis Abdomen: Non distended Musculoskeletal: FROM. No obvious deformities Skin: Warm, dry, intact. No rashes noted Neuro: No obvious focal deficits.  Gait and station are normal  Review of Systems  Constitutional: Negative.   HENT: Negative.    Eyes: Negative.   Respiratory: Negative.    Cardiovascular: Negative.   Gastrointestinal: Negative.   Genitourinary: Negative.   Skin: Negative.   Neurological: Negative.   Psychiatric/Behavioral:  Positive for paranoia, disorganized thoughts.     Blood pressure (!) 141/98, pulse 86, temperature (!) 97.5 F (36.4 C), temperature source Oral, resp. rate 15, height 5' 7 (1.702 m), weight 105.7 kg, SpO2 97%, unknown if currently breastfeeding. Body mass index is 36.49 kg/m.  ASSESSMENT: SATSUKI ZILLMER is an 38 y.o. 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), Carpal tunnel syndrome, Cesarean delivery delivered (10/10/2020), Chlamydia, Chronic hypertension, Chronic post-traumatic stress disorder (PTSD) (03/10/2024), COVID-19 (09/12/2020), Genital HSV, GERD (gastroesophageal reflux  disease), GERD without esophagitis (01/20/2013), Gestational diabetes, Goiter, unspecified (04/18/2010), H/O: cesarean section (09/27/2016), Headache(784.0), Herpes genitalis in women (04/18/2010), IBS  (irritable bowel syndrome), Infection, Intrauterine pregnancy (03/22/2020), Panic attack, Premature rupture of membranes (PROM), onset of labor after 24 hours, antepartum, preterm, third trimester (10/10/2020), Pulmonary embolus (HCC) (10/18/2020), Thyroid  disease, Trichomonas, and Vitamin D  deficiency (03/10/2024).  She presented on 03/09/2024  4:35 PM for Bipolar 1 disorder, depressed, severe (HCC).  She arrived via EMS and was involuntarily committed at the outside facility.  She presents as slightly less guarded and paranoid today.  Thoughts are more organized.  Diagnoses / Active Problems: Patient Active Problem List   Diagnosis Date Noted   Hypertension 03/11/2024   PTSD (post-traumatic stress disorder) 03/10/2024   Borderline personality disorder (HCC) 03/10/2024   Prediabetes 03/10/2024   Bipolar 1 disorder, depressed, severe (HCC) 03/09/2024   Vitamin D  deficiency 04/19/2010   GAD (generalized anxiety disorder) 04/18/2010      PLAN: Safety and Monitoring:  -- Involuntary admission to inpatient psychiatric unit for safety, stabilization and treatment  -- Daily contact with patient to assess and evaluate symptoms and progress in treatment  -- Patient's case to be discussed in multi-disciplinary team meeting  -- Observation Level : q15 minute checks  -- Vital signs:  q12 hours  -- Precautions: suicide, elopement, and assault  2. Psychiatric Diagnoses and Treatment:  Patient Active Problem List   Diagnosis Date Noted   Hypertension 03/11/2024   PTSD (post-traumatic stress disorder) 03/10/2024   Borderline personality disorder (HCC) 03/10/2024   Prediabetes 03/10/2024   Bipolar 1 disorder, depressed, severe (HCC) 03/09/2024   Vitamin D  deficiency 04/19/2010   GAD (generalized anxiety disorder) 04/18/2010     Scheduled Medications:  [START ON 03/13/2024] amLODipine  5 mg Oral Daily   ARIPiprazole  10 mg Oral QHS   Vitamin D  (Ergocalciferol )  50,000 Units Oral QHS    As Needed  Medications: acetaminophen , alum & mag hydroxide-simeth, cloNIDine, haloperidol **AND** diphenhydrAMINE , haloperidol lactate **AND** diphenhydrAMINE  **AND** LORazepam , haloperidol lactate **AND** diphenhydrAMINE  **AND** LORazepam , hydrOXYzine , magnesium hydroxide, traZODone     3. Medical Issues Being Addressed:   -- Vitamin D  deficiency as above  Labs reviewed, unremarkable with the exception of: Vitamin D  deficiency 6.53, anemia 10.9, hemoglobin A1c 6.3  4. Discharge Planning:   -- Social work and case management to assist with discharge planning and identification of hospital follow-up needs prior to discharge  -- Estimated LOS: 7 to 10 days  -- Discharge Concerns: Need to establish a safety plan; Medication compliance and effectiveness  -- Discharge Goals: Return home with outpatient referrals for mental health follow-up including medication management/psychotherapy  5. Short Term Goals:  Improve ability to identify changes in lifestyle to reduce recurrence of condition, verbalize feelings, disclose and discuss suicidal ideas, demonstrate self-control, identify and develop effective coping behaviors, compliance with prescribed medications, identify triggers associated with substance abuse/mental health issues, participate in unit milieu and in scheduled group therapies   6. Long Term Goals: Improvement in symptoms so the patient is ready for discharge   --The risks/benefits/side-effects/alternatives to the medications above were discussed in detail with the patient and time was given for questions. The patient provided informed consent.   -- Metabolic profile and EKG monitoring obtained while on an atypical antipsychotic and listed in the EHR    Total Time Spent in Direct Patient Care:  I personally spent 35 minutes on the unit in direct patient care. The direct patient care time included face-to-face time with  the patient, reviewing the patient's chart, communicating with other  professionals, and coordinating care. Greater than 50% of this time was spent in counseling or coordinating care with the patient regarding goals of hospitalization, psycho-education, and discharge planning needs.      Clair Crews, Melinda Jimenez Psychiatrist  03/12/2024, 12:36 PM   I certify that inpatient services furnished can reasonably be expected to improve the patient's condition.    Portions of this note were created using voice recognition software. Minor syntax errors, grammatical content, spelling, or punctuation errors may have occurred unintentionally. Please notify the [facility] if the meaning of any statement is unclear.

## 2024-03-12 NOTE — [facility] Group Notes (Signed)
 [facility] Group Notes:  (Nursing/MHT/Case Management/Adjunct)  Date:  03/12/2024  Time:  9:22 PM Type of Therapy:  Wrap-up group  Participation Level:  Active  Participation Quality:  Appropriate  Affect:  Appropriate  Cognitive:  Appropriate  Insight:  Appropriate  Engagement in Group:  Engaged  Modes of Intervention:  Education  Summary of Progress/Problems: Goal to be D/C. Not met. Rated day 10/10.  Melinda Jimenez 03/12/2024, 9:22 PM

## 2024-03-12 NOTE — Group Note (Signed)
 Therapy Group Note  Group Topic:Other  Group Date: 03/12/2024 Start Time: 1400 End Time: 1430 Facilitators: Takila Kronberg G, OT    The primary objective of this topic is to explore and understand the concept of occupational balance in the context of daily living. The term occupational balance is defined broadly, encompassing all activities that occupy an individual's time and energy, including self-care, leisure, and work-related tasks. The goal is to guide participants towards achieving a harmonious blend of these activities, tailored to their personal values and life circumstances. This balance is aimed at enhancing overall well-being, not by equally distributing time across activities, but by ensuring that daily engagements are fulfilling and not draining. The content delves into identifying various barriers that individuals face in achieving occupational balance, such as overcommitment, misaligned priorities, external pressures, and lack of effective time management. The impact of these barriers on occupational performance, roles, and lifestyles is examined, highlighting issues like reduced efficiency, strained relationships, and potential health problems. Strategies for cultivating occupational balance are a key focus. These strategies include practical methods like time blocking, prioritizing tasks, establishing self-care rituals, decluttering, connecting with nature, and engaging in reflective practices. These approaches are designed to be adaptable and applicable to a wide range of life scenarios, promoting a proactive and mindful approach to daily living. The overall aim is to equip participants with the knowledge and tools to create a balanced lifestyle that supports their mental, emotional, and physical health, thereby improving their functional performance in daily life.     Participation Level: Engaged   Participation Quality: Independent   Behavior: Appropriate   Speech/Thought  Process: Relevant   Affect/Mood: Appropriate   Insight: Fair   Judgement: Fair      Modes of Intervention: Education  Patient Response to Interventions:  Attentive   Plan: Continue to engage patient in OT groups 2 - 3x/week.  03/12/2024  Lynnda Sas, OT  Keiva Dina, OT

## 2024-03-12 NOTE — [facility] Suicide Risk Assessment (Signed)
 [facility] INPATIENT:  Family/Significant Other Suicide Prevention Education  Suicide Prevention Education:  Patient Refusal for Family/Significant Other Suicide Prevention Education: The patient Melinda Jimenez has refused to provide written consent for family/significant other to be provided Family/Significant Other Suicide Prevention Education during admission and/or prior to discharge.  Physician notified.  Vonzell Guerin 03/12/2024, 3:27 PM

## 2024-03-12 NOTE — BHH Counselor (Signed)
 Adult Comprehensive Assessment  Patient ID: KARISMA MEISER, female   DOB: 05-11-1986, 38 y.o.   MRN: [id number]  PSA Chart review; pt refused assessment, reporting that she already talked with the MD and a different CSW during treatment team and did not want to repeat herself. Guarded and labile.  Summary/Recommendations - chart review:  Arisbeth Purrington is a 38yo female who is involuntarily admitted to [facility] secondary to [facility] via EMS needing an evaluation, having a mental breakdown, and homicidal ideation. Current stressor is DSS involvement with her 4 children (ages 51-17), and them being removed from her home in December of 2024. Pt would not elaborate as to why they were removed but per chart note, DSS deemed her house to be unsafe (including door being kicked in). Pt is unemployed and has been since 2020 and is in need of different housing. Pt lives alone, is single, denies any substance use (UDS negative). Per H&P, pt evasive and vague on details leading to her current hospitalization, and past history. Pt did report today that she will return to her apartment at discharge and does not follow up with mental health providers in the community currently. Patient was admitted in the past in 2021 due to suicide attempt. While here, Azara can benefit from crisis stabilization, medication management, therapeutic milieu, and referrals for services.     Vonzell Guerin. 03/12/2024

## 2024-03-13 DIAGNOSIS — F314 Bipolar disorder, current episode depressed, severe, without psychotic features: Secondary | ICD-10-CM | POA: Diagnosis not present

## 2024-03-13 MED ORDER — AMLODIPINE BESYLATE 10 MG PO TABS
10.0000 mg | ORAL_TABLET | Freq: Every day | ORAL | Status: DC
  Filled 2024-03-13 (×2): qty 1

## 2024-03-13 NOTE — Plan of Care (Signed)

## 2024-03-13 NOTE — Group Note (Signed)
 Date:  03/13/2024 Time:  11:04 AM  Group Topic/Focus:  Goals Group:   The focus of this group is to help patients establish daily goals to achieve during treatment and discuss how the patient can incorporate goal setting into their daily lives to aide in recovery.    Participation Level:  Did Not Attend  Participation Quality:    Affect:    Cognitive:    Insight:   Engagement in Group:    Modes of Intervention:    Additional Comments:  Pt were aware of group time. Pt refused to attend group.  Tita Form 03/13/2024, 11:04 AM

## 2024-03-13 NOTE — Group Note (Unsigned)
 Date:  03/13/2024 Time:  10:14 PM  Group Topic/Focus:  Goals Group:   The focus of this group is to help patients establish daily goals to achieve during treatment and discuss how the patient can incorporate goal setting into their daily lives to aide in recovery. Wrap-Up Group:   The focus of this group is to help patients review their daily goal of treatment and discuss progress on daily workbooks.     Participation Level:  {BHH PARTICIPATION WGNFA:21308}  Participation Quality:  {BHH PARTICIPATION QUALITY:22265}  Affect:  {BHH AFFECT:22266}  Cognitive:  {BHH COGNITIVE:22267}  Insight: {BHH Insight2:20797}  Engagement in Group:  {BHH ENGAGEMENT IN MVHQI:69629}  Modes of Intervention:  {BHH MODES OF INTERVENTION:22269}  Additional Comments:  ***  Wakhaila H Blakeleigh Domek 03/13/2024, 10:14 PM

## 2024-03-13 NOTE — Progress Notes (Signed)
 D: Patient is alert, oriented, and cooperative. Denies SI, HI, AVH, and verbally contracts for safety. Patient reports she slept good last night without sleeping medication. Patient reports her appetite as good, energy level as normal, and concentration as good. Patient rates her depression 0/10, hopelessness 0/10, and anxiety 0/10. Patient denies physical symptoms/pain.   Patient on room lockout for groups.    A: Scheduled medications administered per MD order. Support provided. Patient educated on safety on the unit and medications. Routine safety checks every 15 minutes. Patient stated understanding to tell nurse about any new physical symptoms. Patient understands to tell staff of any needs.     R: No adverse drug reactions noted. Patient remains safe at this time and will continue to monitor.    03/13/24 1300  Psych Admission Type (Psych Patients Only)  Admission Status Involuntary  Psychosocial Assessment  Patient Complaints Irritability  Eye Contact Fair  Facial Expression Animated  Affect Anxious  Speech Logical/coherent  Interaction Assertive  Motor Activity Fidgety  Appearance/Hygiene Unremarkable  Behavior Characteristics Cooperative;Calm  Mood Depressed;Anxious  Thought Process  Coherency WDL  Content WDL  Delusions None reported or observed  Perception WDL  Hallucination None reported or observed  Judgment Limited  Confusion None  Danger to Self  Current suicidal ideation? Denies  Self-Injurious Behavior No self-injurious ideation or behavior indicators observed or expressed   Agreement Not to Harm Self Yes  Description of Agreement verbal  Danger to Others  Danger to Others None reported or observed

## 2024-03-13 NOTE — Progress Notes (Signed)
   03/12/24 2041  Psych Admission Type (Psych Patients Only)  Admission Status Involuntary  Psychosocial Assessment  Patient Complaints Suspiciousness  Eye Contact Fair  Facial Expression Animated  Affect Anxious  Speech Logical/coherent  Interaction Assertive  Motor Activity Restless  Appearance/Hygiene Unremarkable  Behavior Characteristics Cooperative;Calm  Mood Depressed  Thought Process  Coherency WDL  Content WDL  Delusions None reported or observed  Perception WDL  Hallucination None reported or observed  Judgment Limited  Confusion None  Danger to Self  Current suicidal ideation? Denies  Self-Injurious Behavior No self-injurious ideation or behavior indicators observed or expressed   Agreement Not to Harm Self Yes  Description of Agreement verbal  Danger to Others  Danger to Others None reported or observed

## 2024-03-13 NOTE — Group Note (Signed)
 Recreation Therapy Group Note   Group Topic:Problem Solving  Group Date: 03/13/2024 Start Time: 6045 End Time: 1000 Facilitators: Gregory Dowe-McCall, LRT,CTRS Location: [address] Dayroom   Group Topic: Problem Solving  Goal Area(s) Addresses:  Patient will effectively work in a team with other group members. Patient will verbalize importance of using appropriate problem solving techniques.   Behavioral Response: Engaged  Intervention: Worksheet  Activity: Dentist. Patients were given two worksheets of brain teasers. Patients got 15 minutes to complete the puzzles. Patients could work with each other if they chose to to figure out what each puzzle was. At the end of the 15 minutes, LRT would go over the answers with the group.     Education: Journalist, newspaper, Communication, Team Building  Education Outcome: Acknowledges understanding/In group clarification offered/Needs additional education.    Affect/Mood: Appropriate   Participation Level: Engaged   Participation Quality: Independent   Behavior: Appropriate   Speech/Thought Process: Focused   Insight: Good   Judgement: Good   Modes of Intervention: Activity   Patient Response to Interventions:  Engaged   Education Outcome:  In group clarification offered    Clinical Observations/Individualized Feedback: Pt attended and participated in group session. Pt was engaged and interactive during group.     Plan: Continue to engage patient in RT group sessions 2-3x/week.   Thor Nannini-McCall, LRT,CTRS 03/13/2024 1:10 PM

## 2024-03-13 NOTE — Progress Notes (Signed)
 [facility] MD Progress Note  03/13/2024 1:29 PM Melinda Jimenez  MRN:  [id number] Principal Problem: Bipolar 1 disorder, depressed, severe (HCC) Diagnosis: Principal Problem:   Bipolar 1 disorder, depressed, severe (HCC) Active Problems:   Vitamin D  deficiency   GAD (generalized anxiety disorder)   PTSD (post-traumatic stress disorder)   Borderline personality disorder (HCC)   Prediabetes   Hypertension   ID & Admission Information: Melinda Jimenez is an 38 y.o. 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), Carpal tunnel syndrome, Cesarean delivery delivered (10/10/2020), Chlamydia, Chronic hypertension, Chronic post-traumatic stress disorder (PTSD) (03/10/2024), COVID-19 (09/12/2020), Genital HSV, GERD (gastroesophageal reflux disease), GERD without esophagitis (01/20/2013), Gestational diabetes, Goiter, unspecified (04/18/2010), H/O: cesarean section (09/27/2016), Headache(784.0), Herpes genitalis in women (04/18/2010), IBS (irritable bowel syndrome), Infection, Intrauterine pregnancy (03/22/2020), Panic attack, Premature rupture of membranes (PROM), onset of labor after 24 hours, antepartum, preterm, third trimester (10/10/2020), Pulmonary embolus (HCC) (10/18/2020), Thyroid  disease, Trichomonas, and Vitamin D  deficiency (03/10/2024).  She presented on 03/09/2024  4:35 PM for Bipolar 1 disorder, depressed, severe (HCC). She arrived via EMS for mental breakdown and homicidal ideation.  Her presentation is concerning for bipolar 1 mixed with psychotic features versus schizoaffective disorder bipolar type.  Subjective:   Case was discussed in the multidisciplinary team. MAR was reviewed and patient was compliant with Abilify  last night.    PRN's in last 24 hours: None  Psychiatric Team made the following recommendations yesterday: Increased Abilify  to 10 mg  nightly Started Norvasc  5 mg for hypertension Continue ergocalciferol  50,000 units a day x 14 days for vitamin D  deficiency  Patient was seen in her room during rounds.  She continues to present as mildly paranoid.  During the interview she asked are you going to jump on me?  You look like you are getting ready to jump on me.  Despite the paranoid ideation she was pleasant and cooperative with the exam.  She reports that she feels less stressed and anxious since having 2 doses of the Abilify .  I recommended that we increase Abilify  to 15 mg, but the patient is quite averse to medication and getting her take it at 10 mg is been a bit of a challenge.  She tells me that she feels subjectively more calm.  She states that she is better able to interact with people, and realizes that the isolating that she had been doing prior to admission was not healthy for her.  Will continue to encourage increasing Abilify  and introduced the idea of starting an LAI, as I have serious concerns about compliance after discharge.  We discussed increasing amlodipine  to 10 mg to better manage hypertension.  She needs a PCP referral at discharge.  Past Psychiatric and Medical Medical History:  Past Medical History:  Diagnosis Date   Anemia    Anxiety    off meds with preg   Bipolar 1 disorder (HCC) 03/10/2024   Borderline personality disorder (HCC) 03/10/2024   Brief psychotic disorder (HCC) 04/03/2020   Carpal tunnel syndrome    Cesarean delivery delivered 10/10/2020   Chlamydia    Chronic hypertension    Chronic post-traumatic stress disorder (PTSD) 03/10/2024   COVID-19 09/12/2020   Genital HSV    Last OB: > 62yr ago; Valtrex  500 mg po prn outbreaks   GERD (gastroesophageal reflux disease)    GERD without esophagitis 01/20/2013   Gestational diabetes  diet controlled   Goiter, unspecified 04/18/2010   Centricity Description: THYROMEGALY  Qualifier: Diagnosis of   By: Earle Glatter, Scott      Centricity  Description: GOITER  Qualifier: Diagnosis of   By: Earle Glatter, Scott      Replacing diagnoses that were inactivated after the 12/31/22 regulatory import     H/O: cesarean section 09/27/2016   last C-section for active HSV, C-section X1: 05/20/13; records in [facility]  TOLAC desired, signed 09/27/16      Headache(784.0)    Herpes genitalis in women 04/18/2010   Qualifier: Diagnosis of   By: Earle Glatter, Scott         IBS (irritable bowel syndrome)    Infection    UTI   Intrauterine pregnancy 03/22/2020   EDC 11/14/2020     Panic attack    Premature rupture of membranes (PROM), onset of labor after 24 hours, antepartum, preterm, third trimester 10/10/2020   Pulmonary embolus (HCC) 10/18/2020   Thyroid  disease    Trichomonas    Vitamin D  deficiency 03/10/2024    Past Surgical History:  Procedure Laterality Date   CESAREAN SECTION N/A 05/20/2013   Procedure: CESAREAN SECTION;  Surgeon: Gabrielle Joiner, MD;  Location: [facility] OR[facility];  Service: Obstetrics;  Laterality: N/A;   CESAREAN SECTION N/A 04/06/2017   Procedure: CESAREAN SECTION;  Surgeon: Othelia Blinks, MD;  Location: [facility] BIRTHING SU[facility];  Service: Obstetrics;  Laterality: N/A;   CESAREAN SECTION  10/10/2020   Procedure: CESAREAN SECTION;  Surgeon: Abigail Abler, MD;  Location: [facility] ORS;  Service: Obstetrics;;   INDUCED ABORTION  2009   VAGINAL DELIVERY  2007    Family History(Medical and Psychiatric):  Family History  Problem Relation Age of Onset   Depression Mother    Anxiety disorder Mother    Breast cancer Other    Bipolar disorder Brother    Diabetes Maternal Grandmother    Cancer Maternal Grandmother    Hypertension Maternal Grandmother    Hypertension Paternal Grandmother    Autism Other    Schizophrenia Other    Social History:  Social History   Substance and Sexual Activity  Alcohol Use No   Comment: Twice a month     Social History   Substance and Sexual Activity  Drug Use No    Social History    Socioeconomic History   Marital status: Single    Spouse name: Not on file   Number of children: 1   Years of education: Not on file   Highest education level: Not on file  Occupational History   Occupation: Unemployed  Tobacco Use   Smoking status: Passive Smoke Exposure - Never Smoker   Smokeless tobacco: Never   Tobacco comments:    was not a daily smoker, tried it  Advertising account planner   Vaping status: Never Used  Substance and Sexual Activity   Alcohol use: No    Comment: Twice a month   Drug use: No   Sexual activity: Yes    Birth control/protection: None    Comment: Patient is interested in implant  Other Topics Concern   Not on file  Social History Narrative   Not on file   Social Drivers of Health   Financial Resource Strain: Not on file  Food Insecurity: No Food Insecurity (03/09/2024)   Hunger Vital Sign    Worried About Running Out of Food in the Last Year: Never true    Ran Out of Food in the  Last Year: Never true  Transportation Needs: No Transportation Needs (03/09/2024)   PRAPARE - Administrator, Civil Service (Medical): No    Lack of Transportation (Non-Medical): No  Physical Activity: Not on file  Stress: Not on file  Social Connections: Unknown (02/05/2022)   Received from [facility]   Social Network    Social Network: Not on file        Current Medications: Current Facility-Administered Medications  Medication Dose Route Frequency Provider Last Rate Last Admin   acetaminophen  (TYLENOL ) tablet 650 mg  650 mg Oral Q6H PRN Buena Carmine, NP       alum & mag hydroxide-simeth (MAALOX/MYLANTA) 200-200-20 MG/5ML suspension 30 mL  30 mL Oral Q4H PRN Buena Carmine, NP       [START ON 03/14/2024] amLODipine  (NORVASC ) tablet 10 mg  10 mg Oral Daily Timmothy Foots, MD       ARIPiprazole  (ABILIFY ) tablet 10 mg  10 mg Oral QHS Shamara Soza S, MD   10 mg at 03/12/24 2130   cloNIDine  (CATAPRES ) tablet 0.1 mg  0.1 mg Oral Q1H PRN Dvaughn Fickle  S, MD       haloperidol  (HALDOL ) tablet 5 mg  5 mg Oral TID PRN Buena Carmine, NP       And   diphenhydrAMINE  (BENADRYL ) capsule 50 mg  50 mg Oral TID PRN Buena Carmine, NP       haloperidol  lactate (HALDOL ) injection 5 mg  5 mg Intramuscular TID PRN Buena Carmine, NP       And   diphenhydrAMINE  (BENADRYL ) injection 50 mg  50 mg Intramuscular TID PRN Buena Carmine, NP       And   LORazepam  (ATIVAN ) injection 2 mg  2 mg Intramuscular TID PRN Buena Carmine, NP       haloperidol  lactate (HALDOL ) injection 10 mg  10 mg Intramuscular TID PRN Buena Carmine, NP       And   diphenhydrAMINE  (BENADRYL ) injection 50 mg  50 mg Intramuscular TID PRN Buena Carmine, NP       And   LORazepam  (ATIVAN ) injection 2 mg  2 mg Intramuscular TID PRN Buena Carmine, NP       hydrOXYzine  (ATARAX ) tablet 25 mg  25 mg Oral TID PRN Nerida Boivin S, MD       magnesium  hydroxide (MILK OF MAGNESIA) suspension 30 mL  30 mL Oral Daily PRN Buena Carmine, NP       traZODone  (DESYREL ) tablet 50 mg  50 mg Oral QHS PRN Braidon Chermak S, MD       Vitamin D  (Ergocalciferol ) (DRISDOL ) 1.25 MG (50000 UNIT) capsule 50,000 Units  50,000 Units Oral QHS Augie Vane S, MD   50,000 Units at 03/12/24 2134    Lab Results:  No results found for this or any previous visit (from the past 48 hours).   Blood Alcohol level:  Lab Results  Component Value Date   [facility] <15 03/09/2024   ETH <10 11/26/2022    Metabolic Disorder Labs: Lab Results  Component Value Date   HGBA1C 6.3 (H) 03/09/2024   MPG 134.11 03/09/2024   MPG 108.28 10/10/2020   No results found for: PROLACTIN Lab Results  Component Value Date   CHOL 142 03/09/2024   TRIG 99 03/09/2024   HDL 29 (L) 03/09/2024   CHOLHDL 4.9 03/09/2024   VLDL 20 03/09/2024   LDLCALC 93 03/09/2024  Physical Findings: AIMS:  , ,  ,  ,    CIWA:    COWS:     Psychiatric Specialty Exam:  Presentation  General Appearance: Fairly  Groomed  Eye Contact: Fair  Speech: Clear and Coherent (Increased in rate, tone, and volume)  Speech Volume: Increased  Handedness: Right   Mood and Affect  Mood: Depressed; Anxious  Affect: Congruent; Inappropriate; Restricted   Thought Process  Thought Processes: Linear  Descriptions of Associations: Intact  Orientation: Full (Time, Place and Person)  Thought Content: Illogical; Paranoid Ideation  History of Schizophrenia/Schizoaffective disorder: No  Duration of Psychotic Symptoms: NA Hallucinations: Hallucinations: None  Ideas of Reference: None  Suicidal Thoughts: Suicidal Thoughts: No  Homicidal Thoughts: Homicidal Thoughts: No   Sensorium  Memory: Immediate Good  Judgment: Poor  Insight: Poor   Executive Functions  Concentration: Fair  Attention Span: Fair  Recall: Fair  Fund of Knowledge: Good  Language: Good   Psychomotor Activity  Psychomotor Activity: Psychomotor Activity: Restlessness   Assets  Assets: Communication Skills; Housing   Sleep  Sleep: Sleep: Good   Musculoskeletal: Strength & Muscle Tone: within normal limits Gait & Station: normal Patient leans: N/A   Physical Exam: General: Sitting comfortably. NAD. HEENT: Normocephalic, atraumatic, MMM, EMOI Lungs: no increased work of breathing noted Heart: no cyanosis Abdomen: Non distended Musculoskeletal: FROM. No obvious deformities Skin: Warm, dry, intact. No rashes noted Neuro: No obvious focal deficits.  Gait and station are normal  Review of Systems  Constitutional: Negative.   HENT: Negative.    Eyes: Negative.   Respiratory: Negative.    Cardiovascular: Negative.   Gastrointestinal: Negative.   Genitourinary: Negative.   Skin: Negative.   Neurological: Negative.   Psychiatric/Behavioral:  Positive for paranoia     Blood pressure (!) 134/100, pulse 99, temperature 98.5 F (36.9 C), temperature source Oral, resp. rate 16, height 5' 7 (1.702 m),  weight 105.7 kg, SpO2 95%, unknown if currently breastfeeding. Body mass index is 36.49 kg/m.  ASSESSMENT: Melinda Jimenez is an 38 y.o. 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), Carpal tunnel syndrome, Cesarean delivery delivered (10/10/2020), Chlamydia, Chronic hypertension, Chronic post-traumatic stress disorder (PTSD) (03/10/2024), COVID-19 (09/12/2020), Genital HSV, GERD (gastroesophageal reflux disease), GERD without esophagitis (01/20/2013), Gestational diabetes, Goiter, unspecified (04/18/2010), H/O: cesarean section (09/27/2016), Headache(784.0), Herpes genitalis in women (04/18/2010), IBS (irritable bowel syndrome), Infection, Intrauterine pregnancy (03/22/2020), Panic attack, Premature rupture of membranes (PROM), onset of labor after 24 hours, antepartum, preterm, third trimester (10/10/2020), Pulmonary embolus (HCC) (10/18/2020), Thyroid  disease, Trichomonas, and Vitamin D  deficiency (03/10/2024).  She presented on 03/09/2024  4:35 PM for Bipolar 1 disorder, depressed, severe (HCC).  She arrived via EMS and was involuntarily committed at the outside facility.  She presents as slightly less guarded and paranoid today.  Thoughts appear mostly organized today.  Diagnoses / Active Problems: Patient Active Problem List   Diagnosis Date Noted   Hypertension 03/11/2024   PTSD (post-traumatic stress disorder) 03/10/2024   Borderline personality disorder (HCC) 03/10/2024   Prediabetes 03/10/2024   Bipolar 1 disorder, depressed, severe (HCC) 03/09/2024   Vitamin D  deficiency 04/19/2010   GAD (generalized anxiety disorder) 04/18/2010      PLAN: Safety and Monitoring:  -- Involuntary admission to inpatient psychiatric unit for safety, stabilization and treatment  -- Daily contact with patient to assess and evaluate symptoms and progress in  treatment  --  Patient's case to be discussed in multi-disciplinary team meeting  -- Observation Level : q15 minute checks  -- Vital signs:  q12 hours  -- Precautions: suicide, elopement, and assault  2. Psychiatric Diagnoses and Treatment:  Patient Active Problem List   Diagnosis Date Noted   Hypertension 03/11/2024   PTSD (post-traumatic stress disorder) 03/10/2024   Borderline personality disorder (HCC) 03/10/2024   Prediabetes 03/10/2024   Bipolar 1 disorder, depressed, severe (HCC) 03/09/2024   Vitamin D  deficiency 04/19/2010   GAD (generalized anxiety disorder) 04/18/2010     Scheduled Medications:  [START ON 03/14/2024] amLODipine   10 mg Oral Daily   ARIPiprazole   10 mg Oral QHS   Vitamin D  (Ergocalciferol )  50,000 Units Oral QHS    As Needed Medications: acetaminophen , alum & mag hydroxide-simeth, cloNIDine , haloperidol  **AND** diphenhydrAMINE , haloperidol  lactate **AND** diphenhydrAMINE  **AND** LORazepam , haloperidol  lactate **AND** diphenhydrAMINE  **AND** LORazepam , hydrOXYzine , magnesium  hydroxide, traZODone     3. Medical Issues Being Addressed:   -- Vitamin D  deficiency as above  Labs reviewed, unremarkable with the exception of: Vitamin D  deficiency 6.53, anemia 10.9, hemoglobin A1c 6.3  4. Discharge Planning:   -- Social work and case management to assist with discharge planning and identification of hospital follow-up needs prior to discharge  -- Estimated LOS: 7 to 10 days  -- Discharge Concerns: Need to establish a safety plan; Medication compliance and effectiveness  -- Discharge Goals: Return home with outpatient referrals for mental health follow-up including medication management/psychotherapy  5. Short Term Goals:  Improve ability to identify changes in lifestyle to reduce recurrence of condition, verbalize feelings, disclose and discuss suicidal ideas, demonstrate self-control, identify and develop effective coping behaviors, compliance with prescribed  medications, identify triggers associated with substance abuse/mental health issues, participate in unit milieu and in scheduled group therapies   6. Long Term Goals: Improvement in symptoms so the patient is ready for discharge   --The risks/benefits/side-effects/alternatives to the medications above were discussed in detail with the patient and time was given for questions. The patient provided informed consent.   -- Metabolic profile and EKG monitoring obtained while on an atypical antipsychotic and listed in the EHR    Total Time Spent in Direct Patient Care:  I personally spent 35 minutes on the unit in direct patient care. The direct patient care time included face-to-face time with the patient, reviewing the patient's chart, communicating with other professionals, and coordinating care. Greater than 50% of this time was spent in counseling or coordinating care with the patient regarding goals of hospitalization, psycho-education, and discharge planning needs.      Clair Crews, MD Psychiatrist  03/13/2024, 1:29 PM   I certify that inpatient services furnished can reasonably be expected to improve the patient's condition.    Portions of this note were created using voice recognition software. Minor syntax errors, grammatical content, spelling, or punctuation errors may have occurred unintentionally. Please notify the [facility] if the meaning of any statement is unclear.

## 2024-03-14 NOTE — Progress Notes (Signed)
 Pt denied SI/HI/AVH and any pain.  Pt declined Norvasc  in am d/t feeling flushed, nauseous, and sick.  Pt is requesting to discharge.   03/14/24 1400  Psych Admission Type (Psych Patients Only)  Admission Status Involuntary  Psychosocial Assessment  Patient Complaints Suspiciousness  Eye Contact Brief  Facial Expression Pensive  Affect Preoccupied  Speech Logical/coherent  Interaction Assertive  Motor Activity Restless  Appearance/Hygiene Unremarkable  Behavior Characteristics Cooperative  Mood Suspicious;Preoccupied  Thought Process  Coherency WDL  Content WDL  Delusions None reported or observed  Perception WDL  Hallucination None reported or observed  Judgment Limited  Confusion None  Danger to Self  Current suicidal ideation? Denies  Self-Injurious Behavior No self-injurious ideation or behavior indicators observed or expressed   Agreement Not to Harm Self Yes  Description of Agreement Verbal  Danger to Others  Danger to Others None reported or observed

## 2024-03-14 NOTE — Progress Notes (Signed)
 Pt did not attend group.

## 2024-03-14 NOTE — [facility] Group Notes (Signed)
 [facility] Group Notes:  (Nursing/MHT/Case Management/Adjunct)  Date:  03/14/2024  Time:  11:05 PM  Type of Therapy:  Psychoeducational Skills  Participation Level:  Did Not Attend  Participation Quality:  Resistant  Affect:  Resistant  Cognitive:  Lacking  Insight:  None  Engagement in Group:  None  Modes of Intervention:  Education  Summary of Progress/Problems: The patient did not attend group this evening.   Lanayah Gartley S 03/14/2024, 11:05 PM

## 2024-03-14 NOTE — Progress Notes (Signed)
   03/13/24 2236  Psych Admission Type (Psych Patients Only)  Admission Status Involuntary  Psychosocial Assessment  Patient Complaints Suspiciousness  Eye Contact Fair  Facial Expression Animated  Affect Anxious  Speech Logical/coherent  Interaction Assertive  Motor Activity Restless  Appearance/Hygiene Unremarkable  Behavior Characteristics Cooperative;Anxious  Mood Depressed;Suspicious;Anxious  Thought Process  Coherency WDL  Content WDL  Delusions None reported or observed  Perception WDL  Hallucination None reported or observed  Judgment Limited  Confusion None  Danger to Self  Current suicidal ideation? Denies  Self-Injurious Behavior No self-injurious ideation or behavior indicators observed or expressed   Agreement Not to Harm Self Yes  Description of Agreement verbal  Danger to Others  Danger to Others None reported or observed

## 2024-03-14 NOTE — Plan of Care (Signed)
   Problem: Education: Goal: Knowledge of [facility] Education information/materials will improve Outcome: Progressing   Problem: Coping: Goal: Ability to verbalize frustrations and anger appropriately will improve Outcome: Progressing   Problem: Safety: Goal: Periods of time without injury will increase Outcome: Progressing

## 2024-03-14 NOTE — Progress Notes (Signed)
 [facility] MD Progress Note  03/14/2024 1:59 PM Melinda Jimenez  MRN:  [id number] Subjective:  Chart reviewed. Discussed care with staff. Patient has been compliant with ordered Abilify . She has not been attending groups. She has been denying SI to staff. On assessment this morning she was guarded and mildly irritable. Her answers were vague to evasive with minimal detail. She did not wish to discuss or share any details regarding the reason her children are not currently in her custody. When asked, she did admit to being upset with her DSS caseworker but denied actually having homicidal ideation towards them. She also denied suicidal ideation. She did not endorse any overt paranoid or hallucinations. She reporting feeling stressed due to recent events and the admission, but did not endorse feeling depressed. She reported feeling stressed due to being around so many people and that she prefers to be alone. She stated that she just wanted to go home and lie down naked in my bed and relax. She denied any side effects from Abilify  but also was not convinced that it has been significantly helpful. She stated the Dr. Daneil Dunker told her that she could discharge today and had arranged for such. She reported sleeping poorly at night because staff and people were playing at my door and making a commotion.    Principal Problem: Bipolar 1 disorder, depressed, severe (HCC) Diagnosis: Principal Problem:   Bipolar 1 disorder, depressed, severe (HCC) Active Problems:   Vitamin D  deficiency   GAD (generalized anxiety disorder)   PTSD (post-traumatic stress disorder)   Prediabetes   Hypertension  Total Time spent with patient: 30 minutes    Past Medical History:  Past Medical History:  Diagnosis Date   Anemia    Anxiety    off meds with preg   Bipolar 1 disorder (HCC) 03/10/2024   Borderline personality disorder (HCC) 03/10/2024   Brief psychotic disorder (HCC) 04/03/2020   Carpal tunnel syndrome    Cesarean  delivery delivered 10/10/2020   Chlamydia    Chronic hypertension    Chronic post-traumatic stress disorder (PTSD) 03/10/2024   COVID-19 09/12/2020   Genital HSV    Last OB: > 68yr ago; Valtrex  500 mg po prn outbreaks   GERD (gastroesophageal reflux disease)    GERD without esophagitis 01/20/2013   Gestational diabetes    diet controlled   Goiter, unspecified 04/18/2010   Centricity Description: THYROMEGALY  Qualifier: Diagnosis of   By: Earle Glatter, Scott      Centricity Description: GOITER  Qualifier: Diagnosis of   By: Earle Glatter, Scott      Replacing diagnoses that were inactivated after the 12/31/22 regulatory import     H/O: cesarean section 09/27/2016   last C-section for active HSV, C-section X1: 05/20/13; records in [facility]  TOLAC desired, signed 09/27/16      Headache(784.0)    Herpes genitalis in women 04/18/2010   Qualifier: Diagnosis of   By: Earle Glatter, Scott         IBS (irritable bowel syndrome)    Infection    UTI   Intrauterine pregnancy 03/22/2020   EDC 11/14/2020     Panic attack    Premature rupture of membranes (PROM), onset of labor after 24 hours, antepartum, preterm, third trimester 10/10/2020   Pulmonary embolus (HCC) 10/18/2020   Thyroid  disease    Trichomonas    Vitamin D  deficiency 03/10/2024    Past Surgical History:  Procedure Laterality Date   CESAREAN SECTION N/A 05/20/2013   Procedure:  CESAREAN SECTION;  Surgeon: Gabrielle Joiner, MD;  Location: [facility];  Service: Obstetrics;  Laterality: N/A;   CESAREAN SECTION N/A 04/06/2017   Procedure: CESAREAN SECTION;  Surgeon: Othelia Blinks, MD;  Location: [facility];  Service: Obstetrics;  Laterality: N/A;   CESAREAN SECTION  10/10/2020   Procedure: CESAREAN SECTION;  Surgeon: Abigail Abler, MD;  Location: [facility] OR[facility];  Service: Obstetrics;;   INDUCED ABORTION  2009   VAGINAL DELIVERY  2007   Family History:  Family History  Problem Relation Age of Onset   Depression Mother    Anxiety  disorder Mother    Breast cancer Other    Bipolar disorder Brother    Diabetes Maternal Grandmother    Cancer Maternal Grandmother    Hypertension Maternal Grandmother    Hypertension Paternal Grandmother    Autism Other    Schizophrenia Other     Social History:  Social History   Substance and Sexual Activity  Alcohol Use No   Comment: Twice a month     Social History   Substance and Sexual Activity  Drug Use No    Social History   Socioeconomic History   Marital status: Single    Spouse name: Not on file   Number of children: 1   Years of education: Not on file   Highest education level: Not on file  Occupational History   Occupation: Unemployed  Tobacco Use   Smoking status: Passive Smoke Exposure - Never Smoker   Smokeless tobacco: Never   Tobacco comments:    was not a daily smoker, tried it  Advertising account planner   Vaping status: Never Used  Substance and Sexual Activity   Alcohol use: No    Comment: Twice a month   Drug use: No   Sexual activity: Yes    Birth control/protection: None    Comment: Patient is interested in implant  Other Topics Concern   Not on file  Social History Narrative   Not on file   Social Drivers of Health   Financial Resource Strain: Not on file  Food Insecurity: No Food Insecurity (03/09/2024)   Hunger Vital Sign    Worried About Running Out of Food in the Last Year: Never true    Ran Out of Food in the Last Year: Never true  Transportation Needs: No Transportation Needs (03/09/2024)   PRAPARE - Administrator, Civil Service (Medical): No    Lack of Transportation (Non-Medical): No  Physical Activity: Not on file  Stress: Not on file  Social Connections: Unknown (02/05/2022)   Received [facility]   Social Network    Social Network: Not on file      Sleep: Poor Estimated Sleeping Duration (Last 24 Hours): 3.25-4.75 hours  Appetite:  Fair  Current Medications: Current Facility-Administered Medications   Medication Dose Route Frequency Provider Last Rate Last Admin   acetaminophen  (TYLENOL ) tablet 650 mg  650 mg Oral Q6H PRN Buena Carmine, NP       alum & mag hydroxide-simeth (MAALOX/MYLANTA) 200-200-20 MG/5ML suspension 30 mL  30 mL Oral Q4H PRN Buena Carmine, NP       amLODipine  (NORVASC ) tablet 10 mg  10 mg Oral Daily Timmothy Foots, MD       ARIPiprazole  (ABILIFY ) tablet 10 mg  10 mg Oral QHS Parker, Alvin S, MD   10 mg at 03/13/24 2138   cloNIDine  (CATAPRES ) tablet 0.1 mg  0.1 mg Oral Q1H  PRN Parker, Alvin S, MD       haloperidol  (HALDOL ) tablet 5 mg  5 mg Oral TID PRN Buena Carmine, NP       And   diphenhydrAMINE  (BENADRYL ) capsule 50 mg  50 mg Oral TID PRN Buena Carmine, NP       haloperidol  lactate (HALDOL ) injection 5 mg  5 mg Intramuscular TID PRN Buena Carmine, NP       And   diphenhydrAMINE  (BENADRYL ) injection 50 mg  50 mg Intramuscular TID PRN Buena Carmine, NP       And   LORazepam  (ATIVAN ) injection 2 mg  2 mg Intramuscular TID PRN Buena Carmine, NP       haloperidol  lactate (HALDOL ) injection 10 mg  10 mg Intramuscular TID PRN Buena Carmine, NP       And   diphenhydrAMINE  (BENADRYL ) injection 50 mg  50 mg Intramuscular TID PRN Buena Carmine, NP       And   LORazepam  (ATIVAN ) injection 2 mg  2 mg Intramuscular TID PRN Buena Carmine, NP       hydrOXYzine  (ATARAX ) tablet 25 mg  25 mg Oral TID PRN Parker, Alvin S, MD       magnesium  hydroxide (MILK OF MAGNESIA) suspension 30 mL  30 mL Oral Daily PRN Buena Carmine, NP       traZODone  (DESYREL ) tablet 50 mg  50 mg Oral QHS PRN Parker, Alvin S, MD       Vitamin D  (Ergocalciferol ) (DRISDOL ) 1.25 MG (50000 UNIT) capsule 50,000 Units  50,000 Units Oral QHS Parker, Alvin S, MD   50,000 Units at 03/12/24 2134    Lab Results: No results found for this or any previous visit (from the past 48 hours).  Blood Alcohol level:  Lab Results  Component Value Date   [facility] <15 03/09/2024    ETH <10 11/26/2022    Metabolic Disorder Labs: Lab Results  Component Value Date   HGBA1C 6.3 (H) 03/09/2024   MPG 134.11 03/09/2024   MPG 108.28 10/10/2020   No results found for: PROLACTIN Lab Results  Component Value Date   CHOL 142 03/09/2024   TRIG 99 03/09/2024   HDL 29 (L) 03/09/2024   CHOLHDL 4.9 03/09/2024   VLDL 20 03/09/2024   LDLCALC 93 03/09/2024    Physical Findings: AIMS:  ,  ,  ,  ,  ,  ,   CIWA:    COWS:     Musculoskeletal: Strength & Muscle Tone: within normal limits Gait & Station: normal Patient leans: N/A  Psychiatric Specialty Exam:  Presentation  General Appearance:  Appropriate for Environment; Casual  Eye Contact: Fair  Speech: Normal Rate  Speech Volume: Normal  Handedness: Right   Mood and Affect  Mood: Irritable  Affect: Restricted   Thought Process  Thought Processes: Goal Directed; Linear  Descriptions of Associations:-- (No obvious loosening)  Orientation:Full (Time, Place and Person)  Thought Content:-- (Perseverates on wanting to return home, appears to be vaguely paranoid ideation, particularly towards medical providers)  History of Schizophrenia/Schizoaffective disorder:No  Duration of Psychotic Symptoms:Less than six months  Hallucinations:Hallucinations: Other (comment) (Denies)  Ideas of Reference:Paranoia  Suicidal Thoughts:Suicidal Thoughts: No  Homicidal Thoughts:Homicidal Thoughts: -- (Denies)   Sensorium  Memory: Immediate Good  Judgment: Other (comment) (Appears mildly impaired)  Insight: Poor   Executive Functions  Concentration: Fair  Attention Span: Fair  Recall: Fair  Fund of Knowledge: Good  Language: Good  Psychomotor Activity  Psychomotor Activity:No data recorded  Assets  Assets: Communication Skills; Housing   Sleep  Sleep: Sleep: Poor    Physical Exam: Physical Exam Vitals and nursing note reviewed.    Review of Systems   Constitutional: Negative.   Respiratory: Negative.    Cardiovascular: Negative.   Neurological: Negative.    Blood pressure 126/77, pulse 86, temperature 98 F (36.7 C), temperature source Oral, resp. rate 16, height 5' 7 (1.702 m), weight 105.7 kg, SpO2 100%, unknown if currently breastfeeding. Body mass index is 36.49 kg/m.  Assessment and Plan: Melinda Jimenez is a 38 y/o female with a history of previously diagnosed BPAD, anxiety, BPD, PTSD, and psychosis who was admitted involuntarily on 03/09/24 after she was alleged to have endorsed homicidal ideation towards her DSS caseworker.   Since admission she was started on Abilify  for a reported history of mixed mania and mood symptoms and appears to be exhibiting less paranoia. She continues to be guarded and irritable however.  # Homicidal Ideation - Alleged from admission. Denies now. No clear evidence that she is still homicidal, which was the primary reason for admission.   # Mood Disorder / Previously diagnosed BPAD / Previously diagnosed BPD - Appears to be experiencing some degree of mood disruption and paranoia, though unclear if this is due to mania or related to borderline personality traits.  - She does not appear to be grossly manic or psychotic. - Continue Abilify  as this can treat both. Patient does not want to increase dose and does not want an LAI. Suspect poor compliance after discharge.  # Disposition - Plan for DC tomorrow. Drop involuntary hold due to lack of evidence of imminent danger to self or others.   Jhonny Moss, MD 03/14/2024, 1:59 PM

## 2024-03-15 MED ORDER — ARIPIPRAZOLE 10 MG PO TABS: 10.0000 mg | ORAL_TABLET | Freq: Every day | ORAL | 0 refills | Status: DC

## 2024-03-15 MED ORDER — NIFEDIPINE ER OSMOTIC RELEASE 30 MG PO TB24: 30.0000 mg | ORAL_TABLET | Freq: Every day | ORAL | 0 refills | Status: DC

## 2024-03-15 NOTE — Group Note (Signed)
 Date:  03/15/2024 Time:  9:45 AM  Group Topic/Focus:  Goals Group:   The focus of this group is to help patients establish daily goals to achieve during treatment and discuss how the patient can incorporate goal setting into their daily lives to aide in recovery.    Participation Level:  Did Not Attend

## 2024-03-15 NOTE — Progress Notes (Signed)
  [facility] Adult Case Management Discharge Plan :  Will you be returning to the same living situation after discharge:  Yes,  Patient to return home.  At discharge, do you have transportation home?: Yes,  CSW arranged taxi services on patient's behalf.  Do you have the ability to pay for your medications: Yes,  [employer]AN / [employer]IL[employer]ED [employer]AN  Release of information consent forms completed and in the chart;  Patient's signature needed at discharge.  Patient to Follow up at:  Follow-up Information     [facility] Go on 04/06/2024.   Why: You have an appointment for medication management services on 04/06/24 at 2:50 pm.  The appointment will be held in person, but you may call to switch to Virtual. Contact information: [address] [place] [address] [phone]         [facility] Go on 03/18/2024.   Specialty: Professional Counselor Why: Please go to this provider on 03/18/24 at 9:00 am for an assessment, to receive an appointment for therapy services. You may also go on Monday through Friday, from 9 am to 1 pm. Contact information: [address] [place]  [address] [place] [address] [phone]                 Next level of care provider has access to [facility] Link:no  Safety Planning and Suicide Prevention discussed: No. Patient Refusal for Family/Significant Other Suicide Prevention Education: The patient KAREEM AUL has refused to provide written consent for family/significant other to be provided Family/Significant Other Suicide Prevention Education during admission and/or prior to discharge.  Physician notified.   SPE completed with pt, as pt refused to consent to family contact. SPI pamphlet provided to pt and pt was encouraged to share information with support network, ask questions, and talk about any concerns relating to SPE. Pt denies access to guns/firearms and verbalized understanding of information  provided. Mobile Crisis information also provided to pt.       Has patient been referred to the Quitline?: Patient refused referral for treatment  Patient has been referred for addiction treatment: No known substance use disorder.  Roselle Conner, LCSW 03/15/2024, 9:27 AM

## 2024-03-15 NOTE — Plan of Care (Signed)
   Problem: Activity: Goal: Interest or engagement in activities will improve Outcome: Progressing Goal: Sleeping patterns will improve Outcome: Progressing   Problem: Coping: Goal: Ability to verbalize frustrations and anger appropriately will improve Outcome: Progressing Goal: Ability to demonstrate self-control will improve Outcome: Progressing   Problem: Health Behavior/Discharge Planning: Goal: Identification of resources available to assist in meeting health care needs will improve Outcome: Progressing Goal: Compliance with treatment plan for underlying cause of condition will improve Outcome: Progressing   Problem: Safety: Goal: Periods of time without injury will increase Outcome: Progressing

## 2024-03-15 NOTE — Progress Notes (Signed)
 Patient discharged to home via taxi. Discharge instructions, all required discharge documents and information about follow-up appointment given to pt with verbalization of understanding. All personal belongings returned to pt at time of discharge. Pt escorted to lobby by RN at 11:15.  03/15/24 0905  Psych Admission Type (Psych Patients Only)  Admission Status Involuntary  Psychosocial Assessment  Patient Complaints Irritability  Eye Contact Brief  Facial Expression Flat  Affect Preoccupied  Speech Logical/coherent  Interaction Assertive  Motor Activity Other (Comment) (wnl)  Appearance/Hygiene Unremarkable  Behavior Characteristics Cooperative  Mood Preoccupied  Thought Process  Coherency WDL  Content WDL  Delusions None reported or observed  Perception WDL  Hallucination None reported or observed  Judgment Limited  Confusion None  Danger to Self  Current suicidal ideation? Denies  Self-Injurious Behavior No self-injurious ideation or behavior indicators observed or expressed   Agreement Not to Harm Self Yes  Description of Agreement VERBAL  Danger to Others  Danger to Others None reported or observed

## 2024-03-15 NOTE — Discharge Summary (Signed)
 Physician Discharge Summary Note  Patient:  Melinda Jimenez is an 38 y.o., female MRN:  [id number] DOB:  1986/03/25 Patient phone:  [phone] (home)  Patient address:   [address] [place] [address],  Total Time spent with patient: {Time; 15 min - 8 hours:17441}  Date of Admission:  03/09/2024 Date of Discharge: ***  Reason for Admission:  ***  Principal Problem: Bipolar 1 disorder, depressed, severe (HCC) Discharge Diagnoses: Principal Problem:   Bipolar 1 disorder, depressed, severe (HCC) Active Problems:   Vitamin D  deficiency   GAD (generalized anxiety disorder)   PTSD (post-traumatic stress disorder)   Prediabetes   Hypertension   Past Psychiatric History: ***  Past Medical History:  Past Medical History:  Diagnosis Date   Anemia    Anxiety    off meds with preg   Bipolar 1 disorder (HCC) 03/10/2024   Borderline personality disorder (HCC) 03/10/2024   Brief psychotic disorder (HCC) 04/03/2020   Carpal tunnel syndrome    Cesarean delivery delivered 10/10/2020   Chlamydia    Chronic hypertension    Chronic post-traumatic stress disorder (PTSD) 03/10/2024   COVID-19 09/12/2020   Genital HSV    Last OB: > 51yr ago; Valtrex  500 mg po prn outbreaks   GERD (gastroesophageal reflux disease)    GERD without esophagitis 01/20/2013   Gestational diabetes    diet controlled   Goiter, unspecified 04/18/2010   Centricity Description: THYROMEGALY  Qualifier: Diagnosis of   By: Earle Glatter, Scott      Centricity Description: GOITER  Qualifier: Diagnosis of   By: Earle Glatter, Scott      Replacing diagnoses that were inactivated after the 12/31/22 regulatory import     H/O: cesarean section 09/27/2016   last C-section for active HSV, C-section X1: 05/20/13; records in epic [facility]  TOLAC desired, signed 09/27/16      Headache(784.0)    Herpes genitalis in women 04/18/2010   Qualifier: Diagnosis of   By: Earle Glatter, Scott         IBS (irritable bowel syndrome)     Infection    UTI   Intrauterine pregnancy 03/22/2020   EDC 11/14/2020     Panic attack    Premature rupture of membranes (PROM), onset of labor after 24 hours, antepartum, preterm, third trimester 10/10/2020   Pulmonary embolus (HCC) 10/18/2020   Thyroid  disease    Trichomonas    Vitamin D  deficiency 03/10/2024    Past Surgical History:  Procedure Laterality Date   CESAREAN SECTION N/A 05/20/2013   Procedure: CESAREAN SECTION;  Surgeon: Gabrielle Joiner, MD;  Location: [facility];  Service: Obstetrics;  Laterality: N/A;   CESAREAN SECTION N/A 04/06/2017   Procedure: CESAREAN SECTION;  Surgeon: Othelia Blinks, MD;  Location: [facility];  Service: Obstetrics;  Laterality: N/A;   CESAREAN SECTION  10/10/2020   Procedure: CESAREAN SECTION;  Surgeon: Abigail Abler, MD;  Location: [facility] LD ORS;  Service: Obstetrics;;   INDUCED ABORTION  2009   VAGINAL DELIVERY  2007   Family History:  Family History  Problem Relation Age of Onset   Depression Mother    Anxiety disorder Mother    Breast cancer Other    Bipolar disorder Brother    Diabetes Maternal Grandmother    Cancer Maternal Grandmother    Hypertension Maternal Grandmother    Hypertension Paternal Grandmother    Autism Other    Schizophrenia Other    Family Psychiatric  History: *** Social  History:  Social History   Substance and Sexual Activity  Alcohol Use No   Comment: Twice a month     Social History   Substance and Sexual Activity  Drug Use No    Social History   Socioeconomic History   Marital status: Single    Spouse name: Not on file   Number of children: 1   Years of education: Not on file   Highest education level: Not on file  Occupational History   Occupation: Unemployed  Tobacco Use   Smoking status: Passive Smoke Exposure - Never Smoker   Smokeless tobacco: Never   Tobacco comments:    was not a daily smoker, tried it  Psychologist, educational Use   Vaping status: Never Used  Substance and Sexual  Activity   Alcohol use: No    Comment: Twice a month   Drug use: No   Sexual activity: Yes    Birth control/protection: None    Comment: Patient is interested in implant  Other Topics Concern   Not on file  Social History Narrative   Not on file   Social Drivers of Health   Financial Resource Strain: Not on file  Food Insecurity: No Food Insecurity (03/09/2024)   Hunger Vital Sign    Worried About Running Out of Food in the Last Year: Never true    Ran Out of Food in the Last Year: Never true  Transportation Needs: No Transportation Needs (03/09/2024)   PRAPARE - Administrator, Civil Service (Medical): No    Lack of Transportation (Non-Medical): No  Physical Activity: Not on file  Stress: Not on file  Social Connections: Unknown (02/05/2022)   [facility]   Social Network    Social Network: Not on file    Hospital Course:  ***  Physical Findings: AIMS:  , ,  ,  ,  ,  ,   CIWA:    COWS:     Musculoskeletal: Strength & Muscle Tone: {desc; muscle tone:32375} Gait & Station: {PE GAIT ED ZOXW:96045} Patient leans: {Patient Leans:21022755}   Psychiatric Specialty Exam:  Presentation  General Appearance:  Appropriate for Environment; Casual  Eye Contact: Fair  Speech: Normal Rate  Speech Volume: Normal  Handedness: Right   Mood and Affect  Mood: Irritable  Affect: Restricted   Thought Process  Thought Processes: Goal Directed; Linear  Descriptions of Associations:-- (No obvious loosening)  Orientation:Full (Time, Place and Person)  Thought Content:-- (Perseverates on wanting to return home, appears to be vaguely paranoid ideation, particularly towards medical providers)  History of Schizophrenia/Schizoaffective disorder:No  Duration of Psychotic Symptoms:Less than six months  Hallucinations:Hallucinations: Other (comment) (Denies)  Ideas of Reference:Paranoia  Suicidal Thoughts:Suicidal Thoughts: No  Homicidal  Thoughts:Homicidal Thoughts: -- (Denies)   Sensorium  Memory: Immediate Good  Judgment: Other (comment) (Appears mildly impaired)  Insight: Poor   Art therapist  Concentration: Fair  Attention Span: Fair  Recall: Fair  Fund of Knowledge: Good  Language: Good   Psychomotor Activity  Psychomotor Activity:No data recorded  Assets  Assets: Communication Skills; Housing   Sleep  Sleep: Sleep: Poor  Estimated Sleeping Duration (Last 24 Hours): 4.25 hours   Physical Exam: Physical Exam ROS Blood pressure (!) 133/97, pulse (!) 105, temperature 97.8 F (36.6 C), temperature source Oral, resp. rate 16, height 5' 7 (1.702 m), weight 105.7 kg, SpO2 99%, unknown if currently breastfeeding. Body mass index is 36.49 kg/m.   Social History   Tobacco Use  Smoking  Status Passive Smoke Exposure - Never Smoker  Smokeless Tobacco Never  Tobacco Comments   was not a daily smoker, tried it   Tobacco Cessation:  {Discharge tobacco cessation prescription:304700209}   Blood Alcohol level:  Lab Results  Component Value Date   [facility] <15 03/09/2024   ETH <10 11/26/2022    Metabolic Disorder Labs:  Lab Results  Component Value Date   HGBA1C 6.3 (H) 03/09/2024   MPG 134.11 03/09/2024   MPG 108.28 10/10/2020   No results found for: PROLACTIN Lab Results  Component Value Date   CHOL 142 03/09/2024   TRIG 99 03/09/2024   HDL 29 (L) 03/09/2024   CHOLHDL 4.9 03/09/2024   VLDL 20 03/09/2024   LDLCALC 93 03/09/2024    See Psychiatric Specialty Exam and Suicide Risk Assessment completed by Attending Physician prior to discharge.  Discharge destination:  {DISCHARGE DESTINATION:22616}  Is patient on multiple antipsychotic therapies at discharge:  {RECOMMEND TAPERING:22617}   Has Patient had three or more failed trials of antipsychotic monotherapy by history:  {BHH ANTIPSYCHOTIC:22903}  Recommended Plan for Multiple Antipsychotic Therapies: {BHH  MULTIPLE ANTIPSYCHOTIC THERAPIES:22905}   Allergies as of 03/15/2024       Reactions   Latex Itching   Zithromax  [azithromycin ] Hives, Nausea And Vomiting   Zofran  [ondansetron ] Nausea Only        Medication List     TAKE these medications      Indication  ARIPiprazole  10 MG tablet Commonly known as: ABILIFY  Take 1 tablet (10 mg total) by mouth at bedtime.  Indication: MIXED BIPOLAR AFFECTIVE DISORDER   NIFEdipine  30 MG 24 hr tablet Commonly known as: PROCARDIA -XL/NIFEDICAL-XL Take 1 tablet (30 mg total) by mouth daily.  Indication: High Blood Pressure        Follow-up Information     [facility] Go on 04/06/2024.   Why: You have an appointment for medication management services on 04/06/24 at 2:50 pm.  The appointment will be held in person, but you may call to switch to Virtual. Contact information: [address] [place] [address] [phone]         [facility] Go on 03/18/2024.   Specialty: Professional Counselor Why: Please go to this provider on 03/18/24 at 9:00 am for an assessment, to receive an appointment for therapy services. You may also go on Monday through Friday, from 9 am to 1 pm. Contact information: [address] [place]  [address] [place] [address] [phone]                 Follow-up recommendations:  {BHH DC FU RECOMMENDATIONS:22620}  Comments:  ***  Signed: Jhonny Moss, MD 03/15/2024, 11:05 AM

## 2024-03-15 NOTE — BHH Suicide Risk Assessment (Signed)
 [facility] Discharge Suicide Risk Assessment   Principal Problem: Bipolar 1 disorder, depressed, severe (HCC) Discharge Diagnoses: Principal Problem:   Bipolar 1 disorder, depressed, severe (HCC) Active Problems:   Vitamin D  deficiency   GAD (generalized anxiety disorder)   PTSD (post-traumatic stress disorder)   Prediabetes   Hypertension   Total Time spent with patient: 15 minutes  Musculoskeletal: Strength & Muscle Tone: within normal limits Gait & Station: normal Patient leans: N/A  Psychiatric Specialty Exam  Presentation  General Appearance:  Appropriate for Environment; Casual  Eye Contact: Fair  Speech: Normal Rate  Speech Volume: Normal  Handedness: Right   Mood and Affect  Mood: Irritable  Duration of Depression Symptoms: Greater than two weeks  Affect: Restricted   Thought Process  Thought Processes: Goal Directed; Linear  Descriptions of Associations:-- (No obvious loosening)  Orientation:Full (Time, Place and Person)  Thought Content:-- (Perseverates on wanting to return home, appears to be vaguely paranoid ideation, particularly towards medical providers)  History of Schizophrenia/Schizoaffective disorder:No  Duration of Psychotic Symptoms:Less than six months  Hallucinations:Hallucinations: Other (comment) (Denies)  Ideas of Reference:Paranoia  Suicidal Thoughts:Suicidal Thoughts: No  Homicidal Thoughts:Homicidal Thoughts: -- (Denies)   Sensorium  Memory: Immediate Good  Judgment: Other (comment) (Appears mildly impaired)  Insight: Poor   Art therapist  Concentration: Fair  Attention Span: Fair  Recall: Fair  Fund of Knowledge: Good  Language: Good   Psychomotor Activity  Psychomotor Activity:No data recorded  Assets  Assets: Communication Skills; Housing   Sleep  Sleep: Sleep: Poor  Estimated Sleeping Duration (Last 24 Hours): 4.25 hours  Physical Exam: Physical Exam Vitals and nursing  note reviewed.    ROS Blood pressure (!) 133/97, pulse (!) 105, temperature 97.8 F (36.6 C), temperature source Oral, resp. rate 16, height 5' 7 (1.702 m), weight 105.7 kg, SpO2 99%, unknown if currently breastfeeding. Body mass index is 36.49 kg/m.  Mental Status Per Nursing Assessment::   On Admission:  NA  Demographic Factors:  Low socioeconomic status and Living alone  Loss Factors: Legal issues  Historical Factors: Impulsivity  Risk Reduction Factors:   Responsible for children under 64 years of age  Continued Clinical Symptoms:  Bipolar Disorder:   Mixed State Unstable or Poor Therapeutic Relationship  Cognitive Features That Contribute To Risk:  Closed-mindedness    Suicide Risk:  Mild:  Suicidal ideation of limited frequency, intensity, duration, and specificity.  There are no identifiable plans, no associated intent, mild dysphoria and related symptoms, good self-control (both objective and subjective assessment), few other risk factors, and identifiable protective factors, including available and accessible social support.   Follow-up Information     [facility] Go on 04/06/2024.   Why: You have an appointment for medication management services on 04/06/24 at 2:50 pm.  The appointment will be held in person, but you may call to switch to Virtual. Contact information: [address] [place] [address] [phone]         [facility] Go on 03/18/2024.   Specialty: Professional Counselor Why: Please go to this provider on 03/18/24 at 9:00 am for an assessment, to receive an appointment for therapy services. You may also go on Monday through Friday, from 9 am to 1 pm. Contact information: [address] [place]  [address] [place] [address] [phone]                 Plan Of Care/Follow-up recommendations:  Compliance with prescribed medications, therapy/psychiatric follow-up. Referral for  counseling.    Jhonny Moss, MD 03/15/2024, 9:54 AM

## 2024-03-15 NOTE — Progress Notes (Signed)
   03/14/24 2227  Psych Admission Type (Psych Patients Only)  Admission Status Involuntary  Psychosocial Assessment  Patient Complaints Suspiciousness  Eye Contact Brief  Facial Expression Flat  Affect Preoccupied  Speech Logical/coherent  Interaction Assertive  Motor Activity Restless  Appearance/Hygiene Unremarkable  Behavior Characteristics Cooperative  Mood Suspicious;Preoccupied  Thought Process  Coherency WDL  Content WDL  Delusions None reported or observed  Perception WDL  Hallucination None reported or observed  Judgment Limited  Confusion None  Danger to Self  Current suicidal ideation? Denies  Self-Injurious Behavior No self-injurious ideation or behavior indicators observed or expressed   Agreement Not to Harm Self Yes  Description of Agreement verbal  Danger to Others  Danger to Others None reported or observed

## 2024-04-20 ENCOUNTER — Telehealth: Payer: MEDICAID | Admitting: Family Medicine

## 2024-04-20 NOTE — Progress Notes (Signed)
 The patient no-showed for appointment despite this provider sending direct link, reaching out via phone with no response and waiting for at least 10 minutes from appointment time for patient to join. They will be marked as a NS for this appointment/time.   Freddy Finner, NP

## 2024-05-02 ENCOUNTER — Emergency Department (HOSPITAL_COMMUNITY)
Admission: EM | Admit: 2024-05-02 | Discharge: 2024-05-02 | Discharge status: Against Medical Advice | Payer: MEDICAID | Attending: Student | Admitting: Student

## 2024-05-02 ENCOUNTER — Other Ambulatory Visit: Payer: Self-pay

## 2024-05-02 ENCOUNTER — Encounter (HOSPITAL_COMMUNITY): Payer: Self-pay

## 2024-05-02 DIAGNOSIS — R1030 Lower abdominal pain, unspecified: Secondary | ICD-10-CM | POA: Diagnosis present

## 2024-05-02 DIAGNOSIS — R42 Dizziness and giddiness: Secondary | ICD-10-CM | POA: Insufficient documentation

## 2024-05-02 DIAGNOSIS — R5383 Other fatigue: Secondary | ICD-10-CM | POA: Diagnosis not present

## 2024-05-02 DIAGNOSIS — N898 Other specified noninflammatory disorders of vagina: Secondary | ICD-10-CM | POA: Insufficient documentation

## 2024-05-02 DIAGNOSIS — Z5321 Procedure and treatment not carried out due to patient leaving prior to being seen by health care provider: Secondary | ICD-10-CM | POA: Diagnosis not present

## 2024-05-02 LAB — COMPREHENSIVE METABOLIC PANEL WITH GFR
ALT: 9 U/L (ref 0–44)
AST: 13 U/L — ABNORMAL LOW (ref 15–41)
Albumin: 3.4 g/dL — ABNORMAL LOW (ref 3.5–5.0)
Alkaline Phosphatase: 57 U/L (ref 38–126)
Anion gap: 10 (ref 5–15)
BUN: 9 mg/dL (ref 6–20)
CO2: 22 mmol/L (ref 22–32)
Calcium: 9.1 mg/dL (ref 8.9–10.3)
Chloride: 105 mmol/L (ref 98–111)
Creatinine, Ser: 0.74 mg/dL (ref 0.44–1.00)
GFR, Estimated: >60 mL/min (ref >60)
Glucose, Bld: 144 mg/dL — ABNORMAL HIGH (ref 70–99)
Potassium: 3.6 mmol/L (ref 3.5–5.1)
Sodium: 137 mmol/L (ref 135–145)
Total Bilirubin: 0.7 mg/dL (ref 0.0–1.2)
Total Protein: 7.7 g/dL (ref 6.5–8.1)

## 2024-05-02 LAB — CBC WITH DIFFERENTIAL/PLATELET
Basophils Absolute: 0.2 K/uL — ABNORMAL HIGH (ref 0.0–0.1)
Basophils Relative: 2 %
Eosinophils Absolute: 0.2 K/uL (ref 0.0–0.5)
Eosinophils Relative: 2 %
HCT: 33.2 % — ABNORMAL LOW (ref 36.0–46.0)
Hemoglobin: 9.7 g/dL — ABNORMAL LOW (ref 12.0–15.0)
Lymphocytes Relative: 32 %
Lymphs Abs: 3 K/uL (ref 0.7–4.0)
MCH: 20.3 pg — ABNORMAL LOW (ref 26.0–34.0)
MCHC: 29.2 g/dL — ABNORMAL LOW (ref 30.0–36.0)
MCV: 69.6 fL — ABNORMAL LOW (ref 80.0–100.0)
Monocytes Absolute: 0.2 K/uL (ref 0.1–1.0)
Monocytes Relative: 2 %
Neutro Abs: 5.8 K/uL (ref 1.7–7.7)
Neutrophils Relative %: 62 %
Platelets: 350 K/uL (ref 150–400)
RBC: 4.77 MIL/uL (ref 3.87–5.11)
RDW: 19.3 % — ABNORMAL HIGH (ref 11.5–15.5)
WBC: 9.4 K/uL (ref 4.0–10.5)
nRBC: 0 % (ref 0.0–0.2)

## 2024-05-02 LAB — LIPASE, BLOOD: Lipase: 24 U/L (ref 11–51)

## 2024-05-02 NOTE — ED Triage Notes (Signed)
 Pt states she is coming in for fatigue in which she explains that she has been falling asleep a lot. She was picked up at home, she was ambulatory in triage and with medic. No complaints of chest pain or shortness of breath at time of triage. Mild complaints of dizziness.   Medic vitals   126/ p 98hr 98%ra 148bgl

## 2024-05-02 NOTE — ED Notes (Signed)
Pt left the ER AMA

## 2024-05-02 NOTE — ED Provider Triage Note (Signed)
 Emergency Medicine Provider Triage Evaluation Note  Melinda Jimenez , a 38 y.o. female  was evaluated in triage.  Pt complains of lower abodminla pain and urinary symptoms x 4 days, green vaginal d/c. LMP mid july.  Review of Systems  Positive: As above Negative: N/V/D  Physical Exam  BP (!) 127/90 (BP Location: Right Arm)   Pulse (!) 107   Temp 98.6 F (37 C)   Resp 18   SpO2 95%  Gen:   Awake, no distress   Resp:  Normal effort  MSK:   Moves extremities without difficulty  Other:  Suprapubic TTP, no CVAT  Medical Decision Making  Medically screening exam initiated at 6:33 AM.  Appropriate orders placed.  Melinda Jimenez was informed that the remainder of the evaluation will be completed by another provider, this initial triage assessment does not replace that evaluation, and the importance of remaining in the ED until their evaluation is complete.  This chart was dictated using voice recognition software, Dragon. Despite the best efforts of this provider to proofread and correct errors, errors may still occur which can change documentation meaning.    Bobette Pleasant SAUNDERS, PA-C 05/02/24 [phone]

## 2024-05-04 ENCOUNTER — Encounter (HOSPITAL_COMMUNITY): Payer: Self-pay

## 2024-05-04 ENCOUNTER — Other Ambulatory Visit: Payer: Self-pay

## 2024-05-04 ENCOUNTER — Emergency Department (HOSPITAL_COMMUNITY)
Admission: EM | Admit: 2024-05-04 | Discharge: 2024-05-05 | Discharge status: Against Medical Advice | Payer: MEDICAID | Attending: Emergency Medicine | Admitting: Emergency Medicine

## 2024-05-04 DIAGNOSIS — N898 Other specified noninflammatory disorders of vagina: Secondary | ICD-10-CM | POA: Diagnosis present

## 2024-05-04 DIAGNOSIS — Z5321 Procedure and treatment not carried out due to patient leaving prior to being seen by health care provider: Secondary | ICD-10-CM | POA: Diagnosis not present

## 2024-05-04 NOTE — ED Notes (Signed)
 Pt called for vitals and labs with no response.

## 2024-05-04 NOTE — ED Provider Triage Note (Signed)
 Emergency Medicine Provider Triage Evaluation Note  LATAISHA COLAN , a 38 y.o. female  was evaluated in triage.  Pt complains of pelvic pain and discharge.  Review of Systems  Positive: Pelvic pain, discharge, bleeding Negative: syncope  Physical Exam  BP (!) 110/52   Pulse 86   Temp 98.7 F (37.1 C) (Oral)   Resp 16   Ht 5' 7 (1.702 m)   Wt 105 kg   SpO2 98%   BMI 36.26 kg/m  Gen:   Awake, no distress   Resp:  Normal effort  MSK:   Moves extremities without difficulty  Other:    Medical Decision Making  Medically screening exam initiated at 3:06 PM.  Appropriate orders placed.  JAZLYNNE MILLINER was informed that the remainder of the evaluation will be completed by another provider, this initial triage assessment does not replace that evaluation, and the importance of remaining in the ED until their evaluation is complete.     Francesca Elsie CROME, MD 05/04/24 [phone]

## 2024-05-04 NOTE — ED Triage Notes (Signed)
 Pt coming in today with lower abdominal pain and pain with urination pt reports these symptoms x 1 week .   Bp 148/98 Hr 98 Spo2 96% Cbg 96

## 2024-05-04 NOTE — ED Triage Notes (Signed)
 Pt reports lightheadedness and pain in her vagina that is more like a pulling feeling.

## 2024-05-04 NOTE — ED Triage Notes (Signed)
 Pt reporting green vaginal discharge. Pt asking to be checked for stds today

## 2024-05-05 NOTE — ED Notes (Signed)
 Cytology department order for GC/Chlamydia d/c per staff in the lab.

## 2024-05-07 ENCOUNTER — Encounter (HOSPITAL_COMMUNITY): Payer: Self-pay

## 2024-05-07 ENCOUNTER — Emergency Department (HOSPITAL_COMMUNITY)
Admission: EM | Admit: 2024-05-07 | Discharge: 2024-05-07 | Discharge status: Against Medical Advice | Payer: MEDICAID | Attending: Emergency Medicine | Admitting: Emergency Medicine

## 2024-05-07 ENCOUNTER — Other Ambulatory Visit: Payer: Self-pay

## 2024-05-07 DIAGNOSIS — R109 Unspecified abdominal pain: Secondary | ICD-10-CM | POA: Insufficient documentation

## 2024-05-07 DIAGNOSIS — R002 Palpitations: Secondary | ICD-10-CM | POA: Insufficient documentation

## 2024-05-07 DIAGNOSIS — Z5321 Procedure and treatment not carried out due to patient leaving prior to being seen by health care provider: Secondary | ICD-10-CM | POA: Diagnosis not present

## 2024-05-07 DIAGNOSIS — R519 Headache, unspecified: Secondary | ICD-10-CM | POA: Insufficient documentation

## 2024-05-07 LAB — URINALYSIS, ROUTINE W REFLEX MICROSCOPIC
Bilirubin Urine: NEGATIVE
Glucose, UA: NEGATIVE mg/dL
Hgb urine dipstick: NEGATIVE
Ketones, ur: NEGATIVE mg/dL
Leukocytes,Ua: NEGATIVE
Nitrite: NEGATIVE
Protein, ur: NEGATIVE mg/dL
Specific Gravity, Urine: 1.014 (ref 1.005–1.030)
pH: 7 (ref 5.0–8.0)

## 2024-05-07 NOTE — ED Notes (Addendum)
 Pt was seen walking to the waiting room with her pocket book in hand & not telling staff she was leaving or where she was going before her MSE was complete.

## 2024-05-07 NOTE — ED Triage Notes (Addendum)
 Pt BIB [facility] from home with original complaint of chest palpitations & after EMS took v/s she refused getting placed on cardiac monitor & stated to EMS Actually its not my chest, I have a UTI. EMS reports she c/o Rt side flank pain & endorses I have been passing out.148/92, 90 bpm, 99% on RA. Once in ED pt reports to this RN she has been sweating very bad with a HA for a while & feeling like she was twitching & that she had been sore in her private parts & bleeding from her rectum the past 24 hrs.

## 2024-05-13 ENCOUNTER — Ambulatory Visit: Payer: MEDICAID

## 2024-05-31 ENCOUNTER — Emergency Department (HOSPITAL_COMMUNITY)
Admission: EM | Admit: 2024-05-31 | Discharge: 2024-05-31 | Discharge status: Against Medical Advice | Payer: MEDICAID | Attending: Emergency Medicine | Admitting: Emergency Medicine

## 2024-05-31 ENCOUNTER — Other Ambulatory Visit: Payer: Self-pay

## 2024-05-31 DIAGNOSIS — Z5321 Procedure and treatment not carried out due to patient leaving prior to being seen by health care provider: Secondary | ICD-10-CM | POA: Insufficient documentation

## 2024-05-31 DIAGNOSIS — R300 Dysuria: Secondary | ICD-10-CM | POA: Diagnosis not present

## 2024-05-31 DIAGNOSIS — R1031 Right lower quadrant pain: Secondary | ICD-10-CM | POA: Diagnosis not present

## 2024-05-31 DIAGNOSIS — M549 Dorsalgia, unspecified: Secondary | ICD-10-CM | POA: Diagnosis not present

## 2024-05-31 DIAGNOSIS — R42 Dizziness and giddiness: Secondary | ICD-10-CM | POA: Diagnosis not present

## 2024-05-31 DIAGNOSIS — R1032 Left lower quadrant pain: Secondary | ICD-10-CM | POA: Diagnosis present

## 2024-05-31 LAB — URINALYSIS, W/ REFLEX TO CULTURE (INFECTION SUSPECTED)
Bilirubin Urine: NEGATIVE
Glucose, UA: NEGATIVE mg/dL
Hgb urine dipstick: NEGATIVE
Ketones, ur: NEGATIVE mg/dL
Leukocytes,Ua: NEGATIVE
Nitrite: NEGATIVE
Protein, ur: NEGATIVE mg/dL
Specific Gravity, Urine: 1.017 (ref 1.005–1.030)
pH: 5 (ref 5.0–8.0)

## 2024-05-31 NOTE — ED Provider Notes (Signed)
  Physical Exam  BP (!) 131/97 (BP Location: Right Arm)   Pulse 88   Temp 97.8 F (36.6 C) (Oral)   Resp 18   Ht 5' 7 (1.702 m)   Wt 105 kg   SpO2 100%   BMI 36.26 kg/m   Physical Exam  Procedures  Procedures  ED Course / MDM    Medical Decision Making Amount and/or Complexity of Data Reviewed Labs: ordered.   Unable to evaluate the patient as she had eloped before being evaluated in the emergency department.       Melinda Jimenez 05/31/24 2025    Dasie Faden, MD 06/02/24 [phone]

## 2024-05-31 NOTE — ED Notes (Signed)
 Pt refused labs with this RN and phlebotomy. Pt was seen by staff walking out. PA aware

## 2024-05-31 NOTE — ED Notes (Signed)
 This phlebotomist attempted to get labs from patient. Patient was not laying in bed and chose to sit in visitors chair. Patient stated I do not want to get blood work done, I don't need it done. You don't even have the right stuff to do it. RN Regino was in hallway and is aware of patients refusal.

## 2024-05-31 NOTE — ED Triage Notes (Addendum)
 Pt BIB EMS for abd pain starting yesterday. Pt also was light headed yesterday.  Denies NVD. Today pt has LLQ and RLQ abd pain, dysuria.

## 2024-05-31 NOTE — ED Triage Notes (Addendum)
 Charted in error.

## 2024-06-22 ENCOUNTER — Other Ambulatory Visit (HOSPITAL_COMMUNITY): Payer: MEDICAID

## 2024-06-22 ENCOUNTER — Emergency Department (HOSPITAL_COMMUNITY): Payer: MEDICAID

## 2024-06-22 ENCOUNTER — Emergency Department (HOSPITAL_COMMUNITY)
Admission: EM | Admit: 2024-06-22 | Discharge: 2024-06-23 | Disposition: A | Discharge status: Home / Self Care | Payer: MEDICAID | Attending: Emergency Medicine | Admitting: Emergency Medicine

## 2024-06-22 ENCOUNTER — Other Ambulatory Visit: Payer: Self-pay

## 2024-06-22 ENCOUNTER — Encounter (HOSPITAL_COMMUNITY): Payer: Self-pay

## 2024-06-22 DIAGNOSIS — N939 Abnormal uterine and vaginal bleeding, unspecified: Secondary | ICD-10-CM | POA: Insufficient documentation

## 2024-06-22 DIAGNOSIS — R739 Hyperglycemia, unspecified: Secondary | ICD-10-CM | POA: Insufficient documentation

## 2024-06-22 DIAGNOSIS — I10 Essential (primary) hypertension: Secondary | ICD-10-CM | POA: Insufficient documentation

## 2024-06-22 DIAGNOSIS — E876 Hypokalemia: Secondary | ICD-10-CM | POA: Diagnosis not present

## 2024-06-22 DIAGNOSIS — D649 Anemia, unspecified: Secondary | ICD-10-CM | POA: Diagnosis not present

## 2024-06-22 DIAGNOSIS — Z91040 Latex allergy status: Secondary | ICD-10-CM | POA: Insufficient documentation

## 2024-06-22 DIAGNOSIS — N12 Tubulo-interstitial nephritis, not specified as acute or chronic: Secondary | ICD-10-CM | POA: Diagnosis not present

## 2024-06-22 LAB — CBC WITH DIFFERENTIAL/PLATELET
Abs Immature Granulocytes: 0.02 K/uL (ref 0.00–0.07)
Basophils Absolute: 0.1 K/uL (ref 0.0–0.1)
Basophils Relative: 1 %
Eosinophils Absolute: 0.1 K/uL (ref 0.0–0.5)
Eosinophils Relative: 1 %
HCT: 34.2 % — ABNORMAL LOW (ref 36.0–46.0)
Hemoglobin: 10.1 g/dL — ABNORMAL LOW (ref 12.0–15.0)
Immature Granulocytes: 0 %
Lymphocytes Relative: 40 %
Lymphs Abs: 4.2 K/uL — ABNORMAL HIGH (ref 0.7–4.0)
MCH: 20.5 pg — ABNORMAL LOW (ref 26.0–34.0)
MCHC: 29.5 g/dL — ABNORMAL LOW (ref 30.0–36.0)
MCV: 69.4 fL — ABNORMAL LOW (ref 80.0–100.0)
Monocytes Absolute: 0.6 K/uL (ref 0.1–1.0)
Monocytes Relative: 6 %
Neutro Abs: 5.4 K/uL (ref 1.7–7.7)
Neutrophils Relative %: 52 %
Platelets: 377 K/uL (ref 150–400)
RBC: 4.93 MIL/uL (ref 3.87–5.11)
RDW: 19.3 % — ABNORMAL HIGH (ref 11.5–15.5)
WBC: 10.4 K/uL (ref 4.0–10.5)
nRBC: 0 % (ref 0.0–0.2)

## 2024-06-22 LAB — URINALYSIS, ROUTINE W REFLEX MICROSCOPIC
Bilirubin Urine: NEGATIVE
Glucose, UA: NEGATIVE mg/dL
Ketones, ur: NEGATIVE mg/dL
Nitrite: NEGATIVE
Protein, ur: 30 mg/dL — AB
RBC / HPF: >50 RBC/hpf (ref 0–5)
Specific Gravity, Urine: 1.019 (ref 1.005–1.030)
pH: 5 (ref 5.0–8.0)

## 2024-06-22 LAB — COMPREHENSIVE METABOLIC PANEL WITH GFR
ALT: 9 U/L (ref 0–44)
AST: 16 U/L (ref 15–41)
Albumin: 3.5 g/dL (ref 3.5–5.0)
Alkaline Phosphatase: 61 U/L (ref 38–126)
Anion gap: 12 (ref 5–15)
BUN: 13 mg/dL (ref 6–20)
CO2: 23 mmol/L (ref 22–32)
Calcium: 9.1 mg/dL (ref 8.9–10.3)
Chloride: 100 mmol/L (ref 98–111)
Creatinine, Ser: 0.74 mg/dL (ref 0.44–1.00)
GFR, Estimated: >60 mL/min (ref >60)
Glucose, Bld: 148 mg/dL — ABNORMAL HIGH (ref 70–99)
Potassium: 3.2 mmol/L — ABNORMAL LOW (ref 3.5–5.1)
Sodium: 135 mmol/L (ref 135–145)
Total Bilirubin: 0.8 mg/dL (ref 0.0–1.2)
Total Protein: 7.9 g/dL (ref 6.5–8.1)

## 2024-06-22 LAB — HCG, QUANTITATIVE, PREGNANCY: hCG, Beta Chain, Quant, S: <1 m[IU]/mL (ref <5)

## 2024-06-22 NOTE — ED Triage Notes (Signed)
 Vaginal bleeding that originated today ~4am associated with dizziness, abdominal pain and presyncope sensation. (+) preg 2 weeks ago.   LMP 05/12/24

## 2024-06-22 NOTE — ED Provider Triage Note (Signed)
 Emergency Medicine Provider Triage Evaluation Note  Melinda Jimenez , a 38 y.o. female  was evaluated in triage.  Pt complains of vaginal bleeding and abdominal pain starting after she awoke this morning.  Patient reports lightheadedness but no syncope.  She has also had pain that extends down into her legs.  She is ambulatory.  No vomiting.  Pain is left-sided and left flank.  She reports a positive pregnancy test about 2 weeks ago.  Reports last menstrual period early August.  Review of Systems  Positive: Vaginal bleeding, abdominal pain Negative: Vomiting  Physical Exam  BP (!) 141/97 (BP Location: Right Wrist)   Pulse 81   Temp 99.3 F (37.4 C)   Resp 16   Ht 5' 7 (1.702 m)   Wt 104.3 kg   LMP 05/12/2024   SpO2 97%   BMI 36.02 kg/m  Gen:   Awake, no distress   Resp:  Normal effort  MSK:   Moves extremities without difficulty  Other:  Left sided and left lateral abdominal tenderness, no lower extremity swelling  Medical Decision Making  Medically screening exam initiated at 9:29 PM.  Appropriate orders placed.  Melinda Jimenez was informed that the remainder of the evaluation will be completed by another provider, this initial triage assessment does not replace that evaluation, and the importance of remaining in the ED until their evaluation is complete.     Desiderio Chew, PA-C 06/22/24 2130

## 2024-06-22 NOTE — ED Provider Notes (Signed)
 [place] EMERGENCY DEPARTMENT AT [facility] Provider Note   CSN: [id number] Arrival date & time: 06/22/24  2105     Patient presents with: Vaginal Bleeding   Melinda Jimenez is a 38 y.o. female.  {Add pertinent medical, surgical, social history, OB history to YEP:67052}  Vaginal Bleeding      Prior to Admission medications   Medication Sig Start Date End Date Taking? Authorizing Provider  ARIPiprazole  (ABILIFY ) 10 MG tablet Take 1 tablet (10 mg total) by mouth at bedtime. 03/15/24   Prentis Kitchens A, DO  NIFEdipine  (PROCARDIA -XL/NIFEDICAL-XL) 30 MG 24 hr tablet Take 1 tablet (30 mg total) by mouth daily. 03/15/24 04/14/24  Prentis Kitchens LABOR, DO  fluticasone  (FLONASE ) 50 MCG/ACT nasal spray Place 1 spray into both nostrils daily. Use for 2-3 weeks to help with the nasal congestion and ear pain Patient not taking: Reported on 01/21/2020 01/20/20 02/11/20  Doretha Folks, MD  sertraline  (ZOLOFT ) 25 MG tablet Take one tablet daily. If tolerating, increase to two tablets in one week. Patient not taking: Reported on 01/21/2020 01/18/20 02/11/20  Prentiss Dorothyann Maxwell, MD  zolpidem  (AMBIEN ) 5 MG tablet Take 1 tablet (5 mg total) by mouth at bedtime as needed for sleep. Patient not taking: Reported on 01/21/2020 01/20/20 02/11/20  Doretha Folks, MD    Allergies: Latex, Zithromax  [azithromycin ], and Zofran  [ondansetron ]    Review of Systems  Genitourinary:  Positive for vaginal bleeding.    Updated Vital Signs BP (!) 157/124 (BP Location: Right Arm)   Pulse 78   Temp 99 F (37.2 C) (Temporal)   Resp 18   Ht 5' 7 (1.702 m)   Wt 104.3 kg   LMP 05/12/2024   SpO2 100%   BMI 36.02 kg/m   Physical Exam  (all labs ordered are listed, but only abnormal results are displayed) Labs Reviewed  CBC WITH DIFFERENTIAL/PLATELET - Abnormal; Notable for the following components:      Result Value   Hemoglobin 10.1 (*)    HCT 34.2 (*)    MCV 69.4 (*)    MCH 20.5 (*)    MCHC  29.5 (*)    RDW 19.3 (*)    Lymphs Abs 4.2 (*)    All other components within normal limits  COMPREHENSIVE METABOLIC PANEL WITH GFR - Abnormal; Notable for the following components:   Potassium 3.2 (*)    Glucose, Bld 148 (*)    All other components within normal limits  URINALYSIS, ROUTINE W REFLEX MICROSCOPIC - Abnormal; Notable for the following components:   APPearance HAZY (*)    Hgb urine dipstick LARGE (*)    Protein, ur 30 (*)    Leukocytes,Ua SMALL (*)    Bacteria, UA FEW (*)    All other components within normal limits  HCG, QUANTITATIVE, PREGNANCY    EKG: None  Radiology: No results found.  {Document cardiac monitor, telemetry assessment procedure when appropriate:32947} Procedures   Medications Ordered in the ED - No data to display    {Click here for ABCD2, HEART and other calculators REFRESH Note before signing:1}                              Medical Decision Making Amount and/or Complexity of Data Reviewed Labs: ordered.   ***  {Document critical care time when appropriate  Document review of labs and clinical decision tools ie CHADS2VASC2, etc  Document your independent review of radiology images and  any outside records  Document your discussion with family members, caretakers and with consultants  Document social determinants of health affecting pt's care  Document your decision making why or why not admission, treatments were needed:32947:::1}   Final diagnoses:  None    ED Discharge Orders     None

## 2024-06-22 NOTE — ED Notes (Signed)
 Pt refused blood draw....KM

## 2024-06-23 LAB — WET PREP, GENITAL
Clue Cells Wet Prep HPF POC: NONE SEEN
Sperm: NONE SEEN
Trich, Wet Prep: NONE SEEN
WBC, Wet Prep HPF POC: <10 (ref <10)
Yeast Wet Prep HPF POC: NONE SEEN

## 2024-06-23 LAB — GC/CHLAMYDIA PROBE AMP (~~LOC~~) NOT AT ARMC
Chlamydia: NEGATIVE
Comment: NEGATIVE
Comment: NORMAL
Neisseria Gonorrhea: NEGATIVE

## 2024-06-23 LAB — URINE CULTURE

## 2024-06-23 LAB — LIPASE, BLOOD: Lipase: 20 U/L (ref 11–51)

## 2024-06-23 MED ORDER — MEDROXYPROGESTERONE ACETATE 5 MG PO TABS: 10.0000 mg | ORAL_TABLET | Freq: Three times a day (TID) | ORAL | 0 refills | Status: DC

## 2024-06-23 MED ORDER — SULFAMETHOXAZOLE-TRIMETHOPRIM 800-160 MG PO TABS: 1.0000 | ORAL_TABLET | Freq: Two times a day (BID) | ORAL | 0 refills | Status: AC

## 2024-06-23 NOTE — ED Notes (Signed)
 Wet prep collected. Pt is a/ox4 and states that she is ready to go. New mesh underwear and pads provided to patient.

## 2024-06-23 NOTE — ED Notes (Signed)
 Went to go d/c pt, pt was not in the room or anywhere to be found. Pt left w/o discharge paperwork.

## 2024-06-23 NOTE — Discharge Instructions (Signed)
 You were found to have a urinary tract infection that is also involving your kidneys. This is called pyelonephritis. Your urine has been sent off for culture to see what bacteria is in your urine. If your antibiotic needs to be changed, you will be contacted.  You have a low hemoglobin which is one of your blood counts. This is called an anemia.  This is frequently due to heavy menstrual cycles or iron deficiency.  Please follow-up with your PCP within the next 2 weeks for repeat of this value and for further treatment.  Your yeast, trichomoniasis, and BV testing was negative.  As discussed, your gonorrhea and Chlamydia testing is still pending.  If this is positive you will be contacted to undergo further treatment.  You were found to have a slightly low potassium level on your labs today. Please increase your dietary intake of potassium with foods such as avocados, potatoes, bananas, spinach, salmon. Please have your PCP monitor this value.    Medications: You have been prescribed an antibiotic called trimethoprim /sulfamethoxazole  (Bactrim ). Take this antibiotic 2 times a day for the next 10 days. Take the full course of your antibiotic even if you start feeling better. Antibiotics may cause you to have diarrhea.  You have been prescribed a medication called medroxyprogesterone  to help slow your vaginal bleeding.  Take this 3 times daily for the next 5 days.  Follow-up instructions: Please follow-up with your primary care provider if symptoms are not improving within the next 5 days.  Return instructions:  Please return to the Emergency Department if you: Develop confusion or become poorly responsive or faint Develop a fever above 100.66F Worsening pain You have persistent vomiting and/or are unable to keep medications down Please return if you have any other emergent concerns.

## 2024-07-07 ENCOUNTER — Ambulatory Visit (HOSPITAL_COMMUNITY)
Admission: EM | Admit: 2024-07-07 | Discharge: 2024-07-07 | Disposition: A | Discharge status: Home / Self Care | Payer: MEDICAID | Attending: Emergency Medicine | Admitting: Emergency Medicine

## 2024-07-07 ENCOUNTER — Encounter (HOSPITAL_COMMUNITY): Payer: Self-pay

## 2024-07-07 ENCOUNTER — Emergency Department (HOSPITAL_COMMUNITY): Admission: EM | Admit: 2024-07-07 | Discharge: 2024-07-07 | Discharge status: Against Medical Advice | Payer: MEDICAID

## 2024-07-07 ENCOUNTER — Telehealth (HOSPITAL_COMMUNITY): Payer: Self-pay

## 2024-07-07 DIAGNOSIS — B009 Herpesviral infection, unspecified: Secondary | ICD-10-CM | POA: Diagnosis not present

## 2024-07-07 MED ORDER — VALACYCLOVIR HCL 1 G PO TABS: 1000.0000 mg | ORAL_TABLET | Freq: Two times a day (BID) | ORAL | 0 refills | Status: AC

## 2024-07-07 NOTE — Telephone Encounter (Signed)
 Attempted to call pt. Upon discharge pt's cyto swab was not in room or lab. Pt needs reswab.

## 2024-07-07 NOTE — ED Triage Notes (Signed)
 Pt c/o vaginal discharge with odor and rash x3 days. States positive exposure to chlamydia.

## 2024-07-07 NOTE — ED Provider Notes (Signed)
 [facility]    CSN: [id number] Arrival date & time: 07/07/24  1347      History   Chief Complaint Chief Complaint  Patient presents with   Vaginal Discharge    HPI Melinda Jimenez is a 38 y.o. female. History difficult to obtain. Story is confusing. Initially she asked for STI testing because she told CMA she has vaginal odor and was exposed to chlamydia. When I asked why she is here, she reports HSV outbreak x3 days. Outbreaks rare. Last one 4-5 yrs ago. The HSV outbreak is making her head hurt.   I clarified if she was actually exposed to chlamydia as she was negative for STI in sept during ED visit - she reports no new chlamydia exposure since last testing. Does report clear, watery vaginal discharge.     Vaginal Discharge   Past Medical History:  Diagnosis Date   Anemia    Anxiety    off meds with preg   Bipolar 1 disorder (HCC) 03/10/2024   Borderline personality disorder (HCC) 03/10/2024   Brief psychotic disorder (HCC) 04/03/2020   Carpal tunnel syndrome    Cesarean delivery delivered 10/10/2020   Chlamydia    Chronic hypertension    Chronic post-traumatic stress disorder (PTSD) 03/10/2024   COVID-19 09/12/2020   Genital HSV    Last OB: > 64yr ago; Valtrex  500 mg po prn outbreaks   GERD (gastroesophageal reflux disease)    GERD without esophagitis 01/20/2013   Gestational diabetes    diet controlled   Goiter, unspecified 04/18/2010   Centricity Description: THYROMEGALY  Qualifier: Diagnosis of   By: Lelon RIGGERS, Scott      Centricity Description: GOITER  Qualifier: Diagnosis of   By: Lelon RIGGERS, Scott      Replacing diagnoses that were inactivated after the 12/31/22 regulatory import     H/O: cesarean section 09/27/2016   last C-section for active HSV, C-section X1: 05/20/13; records in [facility]  TOLAC desired, signed 09/27/16      Headache(784.0)    Herpes genitalis in women 04/18/2010   Qualifier: Diagnosis of   By: Lelon RIGGERS, Scott         IBS  (irritable bowel syndrome)    Infection    UTI   Intrauterine pregnancy 03/22/2020   EDC 11/14/2020     Panic attack    Premature rupture of membranes (PROM), onset of labor after 24 hours, antepartum, preterm, third trimester 10/10/2020   Pulmonary embolus (HCC) 10/18/2020   Thyroid  disease    Trichomonas    Vitamin D  deficiency 03/10/2024    Patient Active Problem List   Diagnosis Date Noted   Hypertension 03/11/2024   PTSD (post-traumatic stress disorder) 03/10/2024   Borderline personality disorder (HCC) 03/10/2024   Prediabetes 03/10/2024   Bipolar 1 disorder, depressed, severe (HCC) 03/09/2024   Vitamin D  deficiency 04/19/2010   GAD (generalized anxiety disorder) 04/18/2010    Past Surgical History:  Procedure Laterality Date   CESAREAN SECTION N/A 05/20/2013   Procedure: CESAREAN SECTION;  Surgeon: Carlin DELENA Centers, MD;  Location: [facility];  Service: Obstetrics;  Laterality: N/A;   CESAREAN SECTION N/A 04/06/2017   Procedure: CESAREAN SECTION;  Surgeon: Lorence Ozell CROME, MD;  Location: [facility] BIRTHING SUIT[facility];  Service: Obstetrics;  Laterality: N/A;   CESAREAN SECTION  10/10/2020   Procedure: CESAREAN SECTION;  Surgeon: Zina Jerilynn DELENA, MD;  Location: [facility] ORS;  Service: Obstetrics;;   INDUCED ABORTION  2009   VAGINAL DELIVERY  2007  OB History     Gravida  11   Para  5   Term  3   Preterm  2   AB  5   Living  4      SAB  2   IAB  3   Ectopic      Multiple  0   Live Births  5            Home Medications    Prior to Admission medications   Medication Sig Start Date End Date Taking? Authorizing Provider  ARIPiprazole  (ABILIFY ) 10 MG tablet Take 1 tablet (10 mg total) by mouth at bedtime. 03/15/24   Prentis Kitchens A, DO  NIFEdipine  (PROCARDIA -XL/NIFEDICAL-XL) 30 MG 24 hr tablet Take 1 tablet (30 mg total) by mouth daily. 03/15/24 04/14/24  Prentis Kitchens A, DO  fluticasone  (FLONASE ) 50 MCG/ACT nasal spray Place 1 spray into both nostrils daily. Use  for 2-3 weeks to help with the nasal congestion and ear pain Patient not taking: Reported on 01/21/2020 01/20/20 02/11/20  Doretha Folks, MD  sertraline  (ZOLOFT ) 25 MG tablet Take one tablet daily. If tolerating, increase to two tablets in one week. Patient not taking: Reported on 01/21/2020 01/18/20 02/11/20  Prentiss Dorothyann Maxwell, MD  zolpidem  (AMBIEN ) 5 MG tablet Take 1 tablet (5 mg total) by mouth at bedtime as needed for sleep. Patient not taking: Reported on 01/21/2020 01/20/20 02/11/20  Doretha Folks, MD    Family History Family History  Problem Relation Age of Onset   Depression Mother    Anxiety disorder Mother    Breast cancer Other    Bipolar disorder Brother    Diabetes Maternal Grandmother    Cancer Maternal Grandmother    Hypertension Maternal Grandmother    Hypertension Paternal Grandmother    Autism Other    Schizophrenia Other     Social History Social History   Tobacco Use   Smoking status: Passive Smoke Exposure - Never Smoker   Smokeless tobacco: Never   Tobacco comments:    was not a daily smoker, tried it  Psychologist, educational Use   Vaping status: Never Used  Substance Use Topics   Alcohol use: No    Comment: Twice a month   Drug use: No     Allergies   Latex, Zithromax  [azithromycin ], and Zofran  [ondansetron ]   Review of Systems Review of Systems  Genitourinary:  Positive for vaginal discharge.     Physical Exam Triage Vital Signs ED Triage Vitals  Encounter Vitals Group     BP 07/07/24 1439 122/85     Girls Systolic BP Percentile --      Girls Diastolic BP Percentile --      Boys Systolic BP Percentile --      Boys Diastolic BP Percentile --      Pulse Rate 07/07/24 1439 90     Resp 07/07/24 1439 18     Temp 07/07/24 1439 98.3 F (36.8 C)     Temp Source 07/07/24 1439 Oral     SpO2 07/07/24 1439 98 %     Weight --      Height --      Head Circumference --      Peak Flow --      Pain Score 07/07/24 1436 10     Pain Loc --      Pain  Education --      Exclude from Growth Chart --    No data found.  Updated Vital Signs BP  122/85 (BP Location: Left Arm)   Pulse 90   Temp 98.3 F (36.8 C) (Oral)   Resp 18   LMP 05/12/2024   SpO2 98%   Breastfeeding No   Visual Acuity Right Eye Distance:   Left Eye Distance:   Bilateral Distance:    Right Eye Near:   Left Eye Near:    Bilateral Near:     Physical Exam Constitutional:      General: She is not in acute distress.    Appearance: Normal appearance.  Pulmonary:     Effort: Pulmonary effort is normal.  Neurological:     Mental Status: She is alert.      UC Treatments / Results  Labs (all labs ordered are listed, but only abnormal results are displayed) Labs Reviewed  CERVICOVAGINAL ANCILLARY ONLY    EKG   Radiology No results found.  Procedures Procedures (including critical care time)  Medications Ordered in UC Medications - No data to display  Initial Impression / Assessment and Plan / UC Course  I have reviewed the triage vital signs and the nursing notes.  Pertinent labs & imaging results that were available during my care of the patient were reviewed by me and considered in my medical decision making (see chart for details).     Declines offer of ibuprofen  for headache. I rx HSV treatment.   Final Clinical Impressions(s) / UC Diagnoses   Final diagnoses:  HSV (herpes simplex virus) infection   Discharge Instructions   None    ED Prescriptions     Medication Sig Dispense Auth. Provider   valACYclovir  (VALTREX ) 1000 MG tablet Take 1 tablet (1,000 mg total) by mouth 2 (two) times daily for 5 days. 10 tablet Richad Jon HERO, NP      PDMP not reviewed this encounter.   Richad Jon HERO, NP 07/14/24 1358

## 2024-07-07 NOTE — ED Notes (Signed)
 After arriving in ED pt states that it looks kinda full in here.  States that she is going to go over the UC. Pt ambulatory to lobby at this time.

## 2024-07-10 ENCOUNTER — Ambulatory Visit (HOSPITAL_COMMUNITY)
Admission: EM | Admit: 2024-07-10 | Discharge: 2024-07-10 | Disposition: A | Discharge status: Home / Self Care | Payer: MEDICAID | Attending: Urology | Admitting: Urology

## 2024-07-10 DIAGNOSIS — Z5900 Homelessness unspecified: Secondary | ICD-10-CM | POA: Insufficient documentation

## 2024-07-10 DIAGNOSIS — F319 Bipolar disorder, unspecified: Secondary | ICD-10-CM | POA: Insufficient documentation

## 2024-07-10 DIAGNOSIS — R45851 Suicidal ideations: Secondary | ICD-10-CM | POA: Insufficient documentation

## 2024-07-10 DIAGNOSIS — F4310 Post-traumatic stress disorder, unspecified: Secondary | ICD-10-CM | POA: Insufficient documentation
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
## 2024-07-10 NOTE — Discharge Instructions (Addendum)
You are encouraged to follow up with [facility] for outpatient treatment.  Walk in/ Open Access Hours: Monday - Friday 8AM - 11AM (To see provider and therapist) - Arrive around 7 or 7:15 to have a better chance of being seen, as slots fill up.   Friday - 1PM - 4PM (To see therapist only)  [facility] [address] [place], [place] [phone]     Discharge recommendations:  Patient is to take medications as prescribed. Please see information for follow-up appointment with psychiatry and therapy. Please follow up with your primary care provider for all medical related needs.   Therapy: We recommend that patient participate in individual therapy to address mental health concerns.  Medications: The patient or guardian is to contact a medical professional and/or outpatient provider to address any new side effects that develop. The patient or guardian should update outpatient providers of any new medications and/or medication changes.   Atypical antipsychotics: If you are prescribed an atypical antipsychotic, it is recommended that your height, weight, BMI, blood pressure, fasting lipid panel, and fasting blood sugar be monitored by your outpatient providers.  Safety:  The patient should abstain from use of illicit substances/drugs and abuse of any medications. If symptoms worsen or do not continue to improve or if the patient becomes actively suicidal or homicidal then it is recommended that the patient return to the closest hospital emergency department, the [facility], or call 911 for further evaluation and treatment. National Suicide Prevention Lifeline 1-800-SUICIDE or (607) 509-9508.  About 988 988 offers 24/7 access to trained crisis counselors who can help people experiencing mental health-related distress. People can call or text 988 or chat 988lifeline.org for themselves or if they are worried about a loved  one who may need crisis support.  Crisis Mobile: [facility]:                     785-254-4900 (for crisis response 24 hours a day) [facility] Hotline:                                            (670) 616-6764

## 2024-07-11 NOTE — ED Provider Notes (Cosign Needed Addendum)
 Behavioral Health Urgent Care Medical Screening Exam  Patient Name: Melinda Jimenez MRN: [id number] Date of Evaluation: 07/11/24 Chief Complaint:   Diagnosis:  Final diagnoses:  Homelessness    History of Present illness: Melinda Jimenez is a 38 y.o. femalewith a psychiatric history of Bipolar I Disorder and PTSD who presented voluntarily for evaluation of passive suicidal ideation.   Patient seen face to face and her chart reviewed by this NP. On assessment, she appeared minimally cooperative and declined to answer several assessment questions, stating that she was tired and primarily seeking resources for therapy. She reported that she was recently evicted and currently has no stable housing. The patient shared that she has attempted to stay at local shelters, but they were all full. She expressed feelings of hopelessness and endorsed passive suicidal ideation related to her housing insecurity but denied any active plan or intent to harm herself. She states "I want to admitted here for like 2-3 days to reset and think about my next move." She denied homicidal ideation, auditory or visual hallucinations, paranoia, or substance use, including alcohol.  Patient is alert and oriented x4. She was minimally cooperative; her speech was clear and coherent. She maintained good eye contact. Mood was described as "tired," and affect was constricted and congruent with mood. Thought processes appeared goal-directed, and thought content was notable for passive suicidal ideation without plan or intent. There was no evidence of delusions, hallucinations, or paranoia. Insight and judgment were fair.  Discussed with patient that she does not meet criteria for admission. Patient provided resources for outpatient psychiatric services and shelters.   No evidence of imminent danger to self or others at this time. Patient does not meet criteria for psychiatric admission or IVC. Supportive therapy provided about ongoing  stressors. Discussed crisis plan, callling 911/988 or going to Emergency Dept   Flowsheet Row ED from 07/10/2024 in [facility] UC from 07/07/2024 in [facility] at [facility] ED from 06/22/2024 in [facility] Emergency Department at [facility]  C-SSRS RISK CATEGORY Low Risk No Risk No Risk    Psychiatric Specialty Exam  Presentation  General Appearance:Appropriate for Environment  Eye Contact:Good  Speech:Clear and Coherent  Speech Volume:Normal  Handedness:Right   Mood and Affect  Mood: Irritable  Affect: Congruent   Thought Process  Thought Processes: Coherent  Descriptions of Associations:Intact  Orientation:Full (Time, Place and Person)  Thought Content:WDL  Diagnosis of Schizophrenia or Schizoaffective disorder in past: No  Duration of Psychotic Symptoms: Less than six months  Hallucinations:None  Ideas of Reference:None  Suicidal Thoughts:Yes, Passive Without Plan; Without Intent  Homicidal Thoughts:No Without Intent; Without Means to Carry Out   Sensorium  Memory: Immediate Good; Recent Good; Remote Good  Judgment: Poor  Insight: Fair   Chartered certified accountant: Fair  Attention Span: Fair  Recall: Dotti Abe of Knowledge: Fair  Language: Fair   Psychomotor Activity  Psychomotor Activity: Normal   Assets  Assets: Manufacturing systems engineer; Desire for Improvement; Physical Health   Sleep  Sleep: -- (refused to answer)  Number of hours:  -- (unable to access, pt refused to answer)   Physical Exam: Physical Exam Vitals and nursing note reviewed.  Constitutional:      General: She is not in acute distress.    Appearance: She is well-developed. She is not ill-appearing or diaphoretic.  Eyes:     Conjunctiva/sclera: Conjunctivae normal.  Cardiovascular:     Rate and Rhythm: Normal rate.  Pulmonary:     Effort: Pulmonary effort is normal.     Breath sounds:  Normal breath sounds.  Abdominal:     Palpations: Abdomen is soft.  Musculoskeletal:        General: Normal range of motion.     Cervical back: Normal range of motion.  Skin:    Capillary Refill: Capillary refill takes less than 2 seconds.  Neurological:     Mental Status: She is alert and oriented to person, place, and time.  Psychiatric:        Mood and Affect: Mood normal.    Review of Systems  Constitutional: Negative.   HENT: Negative.    Eyes: Negative.   Respiratory: Negative.    Cardiovascular: Negative.   Gastrointestinal: Negative.   Genitourinary: Negative.   Musculoskeletal: Negative.   Skin: Negative.   Neurological: Negative.   Endo/Heme/Allergies: Negative.   Psychiatric/Behavioral:  Positive for depression.    Blood pressure (!) 148/99, pulse 85, temperature 98.4 F (36.9 C), resp. rate 20, last menstrual period 05/12/2024, SpO2 100%. There is no height or weight on file to calculate BMI.  Musculoskeletal: Strength & Muscle Tone: within normal limits Gait & Station: normal Patient leans: Right   [facility] MSE Discharge Disposition for Follow up and Recommendations: Based on my evaluation the patient does not appear to have an emergency medical condition and can be discharged with resources and follow up care in outpatient services for Medication Management, Individual Therapy, and Group Therapy   Kathryne DELENA Show, NP 07/11/2024, 5:18 AM

## 2024-07-27 ENCOUNTER — Ambulatory Visit (HOSPITAL_COMMUNITY): Admission: EM | Admit: 2024-07-27 | Discharge: 2024-07-28 | Payer: MEDICAID

## 2024-07-27 DIAGNOSIS — F332 Major depressive disorder, recurrent severe without psychotic features: Secondary | ICD-10-CM

## 2024-07-27 DIAGNOSIS — R45851 Suicidal ideations: Secondary | ICD-10-CM | POA: Insufficient documentation

## 2024-07-27 DIAGNOSIS — F419 Anxiety disorder, unspecified: Secondary | ICD-10-CM | POA: Insufficient documentation

## 2024-07-27 DIAGNOSIS — F319 Bipolar disorder, unspecified: Secondary | ICD-10-CM | POA: Insufficient documentation

## 2024-07-27 DIAGNOSIS — Z79899 Other long term (current) drug therapy: Secondary | ICD-10-CM | POA: Insufficient documentation

## 2024-07-27 DIAGNOSIS — R45850 Homicidal ideations: Secondary | ICD-10-CM | POA: Insufficient documentation

## 2024-07-27 DIAGNOSIS — Z91148 Patient's other noncompliance with medication regimen for other reason: Secondary | ICD-10-CM | POA: Insufficient documentation

## 2024-07-27 DIAGNOSIS — R451 Restlessness and agitation: Secondary | ICD-10-CM | POA: Insufficient documentation

## 2024-07-27 DIAGNOSIS — Z91040 Latex allergy status: Secondary | ICD-10-CM | POA: Insufficient documentation

## 2024-07-27 LAB — POCT URINE DRUG SCREEN - MANUAL ENTRY (I-SCREEN)
POC Amphetamine UR: NOT DETECTED
POC Buprenorphine (BUP): NOT DETECTED
POC Cocaine UR: NOT DETECTED
POC Marijuana UR: NOT DETECTED
POC Methadone UR: NOT DETECTED
POC Methamphetamine UR: NOT DETECTED
POC Morphine: NOT DETECTED
POC Oxazepam (BZO): NOT DETECTED
POC Oxycodone UR: NOT DETECTED
POC Secobarbital (BAR): NOT DETECTED

## 2024-07-27 LAB — POC URINE PREG, ED: Preg Test, Ur: NEGATIVE

## 2024-07-27 MED ORDER — LORAZEPAM 2 MG/ML IJ SOLN: 2.0000 mg | Freq: Three times a day (TID) | INTRAMUSCULAR | Status: DC | PRN

## 2024-07-27 MED ORDER — ACETAMINOPHEN 325 MG PO TABS: 650.0000 mg | ORAL_TABLET | Freq: Four times a day (QID) | ORAL | Status: DC | PRN

## 2024-07-27 MED ORDER — TRAZODONE HCL 50 MG PO TABS
50.0000 mg | ORAL_TABLET | Freq: Every evening | ORAL | Status: DC | PRN
  Filled 2024-07-27: qty 1

## 2024-07-27 MED ORDER — DIPHENHYDRAMINE HCL 50 MG/ML IJ SOLN: 50.0000 mg | Freq: Three times a day (TID) | INTRAMUSCULAR | Status: DC | PRN

## 2024-07-27 MED ORDER — DIPHENHYDRAMINE HCL 50 MG PO CAPS: 50.0000 mg | ORAL_CAPSULE | Freq: Three times a day (TID) | ORAL | Status: DC | PRN

## 2024-07-27 MED ORDER — MAGNESIUM HYDROXIDE 400 MG/5ML PO SUSP: 30.0000 mL | Freq: Every day | ORAL | Status: DC | PRN

## 2024-07-27 MED ORDER — ALUM & MAG HYDROXIDE-SIMETH 200-200-20 MG/5ML PO SUSP: 30.0000 mL | ORAL | Status: DC | PRN

## 2024-07-27 MED ORDER — HALOPERIDOL 5 MG PO TABS: 5.0000 mg | ORAL_TABLET | Freq: Three times a day (TID) | ORAL | Status: DC | PRN

## 2024-07-27 MED ORDER — HYDROXYZINE HCL 25 MG PO TABS
25.0000 mg | ORAL_TABLET | Freq: Three times a day (TID) | ORAL | Status: DC | PRN
  Filled 2024-07-27: qty 1

## 2024-07-27 MED ORDER — ARIPIPRAZOLE 10 MG PO TABS
10.0000 mg | ORAL_TABLET | Freq: Once | ORAL | Status: DC
  Filled 2024-07-27: qty 1

## 2024-07-27 MED ORDER — HALOPERIDOL LACTATE 5 MG/ML IJ SOLN: 10.0000 mg | Freq: Three times a day (TID) | INTRAMUSCULAR | Status: DC | PRN

## 2024-07-27 MED ORDER — HALOPERIDOL LACTATE 5 MG/ML IJ SOLN: 5.0000 mg | Freq: Three times a day (TID) | INTRAMUSCULAR | Status: DC | PRN
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
## 2024-07-28 ENCOUNTER — Inpatient Hospital Stay (HOSPITAL_COMMUNITY)
Admission: AD | Admit: 2024-07-28 | Discharge: 2024-07-31 | DRG: 885 | Disposition: A | Discharge status: Home / Self Care | Payer: MEDICAID | Source: Intra-hospital

## 2024-07-28 ENCOUNTER — Other Ambulatory Visit: Payer: Self-pay

## 2024-07-28 ENCOUNTER — Encounter (HOSPITAL_COMMUNITY): Payer: Self-pay

## 2024-07-28 DIAGNOSIS — Z5948 Other specified lack of adequate food: Secondary | ICD-10-CM

## 2024-07-28 DIAGNOSIS — Z5941 Food insecurity: Secondary | ICD-10-CM | POA: Diagnosis not present

## 2024-07-28 DIAGNOSIS — Z8249 Family history of ischemic heart disease and other diseases of the circulatory system: Secondary | ICD-10-CM | POA: Diagnosis not present

## 2024-07-28 DIAGNOSIS — Z5900 Homelessness unspecified: Secondary | ICD-10-CM

## 2024-07-28 DIAGNOSIS — K219 Gastro-esophageal reflux disease without esophagitis: Secondary | ICD-10-CM | POA: Diagnosis present

## 2024-07-28 DIAGNOSIS — R45851 Suicidal ideations: Secondary | ICD-10-CM | POA: Diagnosis present

## 2024-07-28 DIAGNOSIS — Z86711 Personal history of pulmonary embolism: Secondary | ICD-10-CM | POA: Diagnosis not present

## 2024-07-28 DIAGNOSIS — Z91040 Latex allergy status: Secondary | ICD-10-CM

## 2024-07-28 DIAGNOSIS — Z888 Allergy status to other drugs, medicaments and biological substances status: Secondary | ICD-10-CM

## 2024-07-28 DIAGNOSIS — F411 Generalized anxiety disorder: Secondary | ICD-10-CM | POA: Diagnosis present

## 2024-07-28 DIAGNOSIS — Z91148 Patient's other noncompliance with medication regimen for other reason: Secondary | ICD-10-CM | POA: Diagnosis not present

## 2024-07-28 DIAGNOSIS — F319 Bipolar disorder, unspecified: Principal | ICD-10-CM | POA: Diagnosis present

## 2024-07-28 DIAGNOSIS — Z79899 Other long term (current) drug therapy: Secondary | ICD-10-CM | POA: Diagnosis not present

## 2024-07-28 DIAGNOSIS — Z8616 Personal history of COVID-19: Secondary | ICD-10-CM | POA: Diagnosis not present

## 2024-07-28 DIAGNOSIS — I10 Essential (primary) hypertension: Secondary | ICD-10-CM | POA: Diagnosis present

## 2024-07-28 DIAGNOSIS — F419 Anxiety disorder, unspecified: Secondary | ICD-10-CM | POA: Diagnosis not present

## 2024-07-28 DIAGNOSIS — E669 Obesity, unspecified: Secondary | ICD-10-CM | POA: Diagnosis present

## 2024-07-28 DIAGNOSIS — Z6834 Body mass index (BMI) 34.0-34.9, adult: Secondary | ICD-10-CM | POA: Diagnosis not present

## 2024-07-28 DIAGNOSIS — F603 Borderline personality disorder: Secondary | ICD-10-CM | POA: Diagnosis present

## 2024-07-28 DIAGNOSIS — F4310 Post-traumatic stress disorder, unspecified: Secondary | ICD-10-CM | POA: Diagnosis not present

## 2024-07-28 DIAGNOSIS — F4312 Post-traumatic stress disorder, chronic: Secondary | ICD-10-CM | POA: Diagnosis present

## 2024-07-28 DIAGNOSIS — Z7722 Contact with and (suspected) exposure to environmental tobacco smoke (acute) (chronic): Secondary | ICD-10-CM | POA: Diagnosis present

## 2024-07-28 DIAGNOSIS — Z560 Unemployment, unspecified: Secondary | ICD-10-CM

## 2024-07-28 DIAGNOSIS — Z833 Family history of diabetes mellitus: Secondary | ICD-10-CM

## 2024-07-28 DIAGNOSIS — R7303 Prediabetes: Secondary | ICD-10-CM | POA: Diagnosis present

## 2024-07-28 DIAGNOSIS — F329 Major depressive disorder, single episode, unspecified: Principal | ICD-10-CM | POA: Diagnosis present

## 2024-07-28 DIAGNOSIS — Z818 Family history of other mental and behavioral disorders: Secondary | ICD-10-CM

## 2024-07-28 DIAGNOSIS — R45850 Homicidal ideations: Secondary | ICD-10-CM | POA: Diagnosis not present

## 2024-07-28 LAB — COMPREHENSIVE METABOLIC PANEL WITH GFR
ALT: 11 U/L (ref 0–44)
AST: 15 U/L (ref 15–41)
Albumin: 3.9 g/dL (ref 3.5–5.0)
Alkaline Phosphatase: 65 U/L (ref 38–126)
Anion gap: 11 (ref 5–15)
BUN: 13 mg/dL (ref 6–20)
CO2: 25 mmol/L (ref 22–32)
Calcium: 9.4 mg/dL (ref 8.9–10.3)
Chloride: 101 mmol/L (ref 98–111)
Creatinine, Ser: 0.75 mg/dL (ref 0.44–1.00)
GFR, Estimated: >60 mL/min (ref >60)
Glucose, Bld: 95 mg/dL (ref 70–99)
Potassium: 4.2 mmol/L (ref 3.5–5.1)
Sodium: 137 mmol/L (ref 135–145)
Total Bilirubin: 0.7 mg/dL (ref 0.0–1.2)
Total Protein: 8 g/dL (ref 6.5–8.1)

## 2024-07-28 LAB — CBC WITH DIFFERENTIAL/PLATELET
Abs Immature Granulocytes: 0.04 K/uL (ref 0.00–0.07)
Basophils Absolute: 0.1 K/uL (ref 0.0–0.1)
Basophils Relative: 1 %
Eosinophils Absolute: 0.1 K/uL (ref 0.0–0.5)
Eosinophils Relative: 1 %
HCT: 34.4 % — ABNORMAL LOW (ref 36.0–46.0)
Hemoglobin: 10.1 g/dL — ABNORMAL LOW (ref 12.0–15.0)
Immature Granulocytes: 0 %
Lymphocytes Relative: 42 %
Lymphs Abs: 4.3 K/uL — ABNORMAL HIGH (ref 0.7–4.0)
MCH: 20.3 pg — ABNORMAL LOW (ref 26.0–34.0)
MCHC: 29.4 g/dL — ABNORMAL LOW (ref 30.0–36.0)
MCV: 69.1 fL — ABNORMAL LOW (ref 80.0–100.0)
Monocytes Absolute: 0.6 K/uL (ref 0.1–1.0)
Monocytes Relative: 6 %
Neutro Abs: 5.2 K/uL (ref 1.7–7.7)
Neutrophils Relative %: 50 %
Platelets: 389 K/uL (ref 150–400)
RBC: 4.98 MIL/uL (ref 3.87–5.11)
RDW: 19.6 % — ABNORMAL HIGH (ref 11.5–15.5)
Smear Review: NORMAL
WBC: 10.4 K/uL (ref 4.0–10.5)
nRBC: 0 % (ref 0.0–0.2)

## 2024-07-28 LAB — HEMOGLOBIN A1C
Hgb A1c MFr Bld: 5.9 % — ABNORMAL HIGH (ref 4.8–5.6)
Mean Plasma Glucose: 122.63 mg/dL

## 2024-07-28 MED ORDER — ALUM & MAG HYDROXIDE-SIMETH 200-200-20 MG/5ML PO SUSP: 30.0000 mL | ORAL | Status: DC | PRN

## 2024-07-28 MED ORDER — ACETAMINOPHEN 325 MG PO TABS: 650.0000 mg | ORAL_TABLET | Freq: Four times a day (QID) | ORAL | Status: DC | PRN

## 2024-07-28 MED ORDER — IBUPROFEN 200 MG PO TABS: 200.0000 mg | ORAL_TABLET | ORAL | Status: DC | PRN

## 2024-07-28 MED ORDER — IBUPROFEN 400 MG PO TABS
400.0000 mg | ORAL_TABLET | Freq: Four times a day (QID) | ORAL | Status: DC | PRN
Start: 2024-07-28 — End: 2024-07-31

## 2024-07-28 MED ORDER — DIPHENHYDRAMINE HCL 50 MG/ML IJ SOLN: 50.0000 mg | Freq: Three times a day (TID) | INTRAMUSCULAR | Status: DC | PRN

## 2024-07-28 MED ORDER — DIPHENHYDRAMINE HCL 25 MG PO CAPS: 50.0000 mg | ORAL_CAPSULE | Freq: Three times a day (TID) | ORAL | Status: DC | PRN

## 2024-07-28 MED ORDER — HALOPERIDOL LACTATE 5 MG/ML IJ SOLN: 10.0000 mg | Freq: Three times a day (TID) | INTRAMUSCULAR | Status: DC | PRN

## 2024-07-28 MED ORDER — TRAZODONE HCL 50 MG PO TABS
50.0000 mg | ORAL_TABLET | Freq: Every evening | ORAL | Status: DC | PRN
  Filled 2024-07-28: qty 1

## 2024-07-28 MED ORDER — LORAZEPAM 2 MG/ML IJ SOLN: 2.0000 mg | Freq: Three times a day (TID) | INTRAMUSCULAR | Status: DC | PRN

## 2024-07-28 MED ORDER — HALOPERIDOL LACTATE 5 MG/ML IJ SOLN: 5.0000 mg | Freq: Three times a day (TID) | INTRAMUSCULAR | Status: DC | PRN

## 2024-07-28 MED ORDER — IBUPROFEN 400 MG PO TABS: 400.0000 mg | ORAL_TABLET | Freq: Four times a day (QID) | ORAL | Status: DC | PRN

## 2024-07-28 MED ORDER — HYDROXYZINE HCL 25 MG PO TABS
25.0000 mg | ORAL_TABLET | Freq: Three times a day (TID) | ORAL | Status: DC | PRN
  Filled 2024-07-28: qty 1

## 2024-07-28 MED ORDER — HALOPERIDOL 5 MG PO TABS: 5.0000 mg | ORAL_TABLET | Freq: Three times a day (TID) | ORAL | Status: DC | PRN
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
## 2024-07-28 NOTE — Discharge Instructions (Signed)
 -  Follow-up with your outpatient psychiatric provider -instructions on appointment date, time, and address (location) are provided to you in discharge paperwork.  -Take your psychiatric medications as prescribed at discharge - instructions are provided to you in the discharge paperwork  -Follow-up with outpatient primary care doctor and other specialists -for management of preventative medicine and any chronic medical disease.  -Recommend abstinence from alcohol, tobacco, and other illicit drug use at discharge.   -If your psychiatric symptoms recur, worsen, or if you have side effects to your psychiatric medications, call your outpatient psychiatric provider, 911, [phone] or go to the nearest emergency department.  -If suicidal thoughts occur, call your outpatient psychiatric provider, 911, 988 or go to the nearest emergency department.  Naloxone (Narcan) can help reverse an overdose when given to the victim quickly.  [facility] offers free naloxone kits and instructions/training on its use.  Add naloxone to your first aid kit and you can help save a life.   Pick up your free kit at the following locations:   [place]:  [facility], [address] [place] [address] [phone]) [facility] [address] [place] [id number] [phone]) [facility] [address] [place] [address]  [place]: [facility] [address] [place] [address] ([phone]) [facility] [address] [place] [address] [phone])

## 2024-07-28 NOTE — Plan of Care (Signed)
   Problem: Education: Goal: Knowledge of [facility] Education information/materials will improve Outcome: Progressing Goal: Verbalization of understanding the information provided will improve Outcome: Progressing

## 2024-07-28 NOTE — ED Notes (Signed)
 Pt accepted to [facility] Rm# [id number]. Pt made aware and e-consent signed. Nurse to nurse report given to Vanessa, RN @[facility] . Pt may arrive NOW. Safe transport called. Safety maintained.

## 2024-07-28 NOTE — Group Note (Signed)
 LCSW Group Therapy Note   Group Date: 07/28/2024 Start Time: 1100 End Time: 1200   Participation:  did not attend  Type of Therapy:  Group Therapy  Topic: Lifestyle:  from "One Day" to "Today is Day One"  Objective:  To promote mental and physical well-being through lifestyle changes in routine, nutrition, sleep, and movement.  Goals: Increase awareness of how lifestyle habits impact mental health. Encourage one small, achievable wellness goal. Support group sharing and accountability.  Summary:  Group members explored how daily habits influence mental health and discussed the importance of starting with small, manageable changes. Participants identified personal goals and shared reflections on improving structure, sleep, diet, and physical activity.  Therapeutic Modalities: CBT - Identifying and challenging all-or-nothing thinking; promoting realistic, helpful thoughts about change. Psychoeducation - Teaching about the impact of sleep, nutrition, movement, and routine on mental health. Motivational Interviewing - Eliciting personal motivation and exploring readiness for change. Goal-Setting - Supporting SMART goals to build self-efficacy and encourage follow-through.   Tritia Endo O Lineth Thielke, LCSWA 07/28/2024  12:32 PM
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
## 2024-07-28 NOTE — Progress Notes (Signed)
 Pt has been accepted to [facility] on 07/28/2024 Bed assignment: 404-01  Pt meets inpatient criteria per: Angela Mclauchlin NP  Attending Physician will be Leita Arts MD  Report can be called to: - unit: Adult unit: [phone]  Pt can arrive after VOL CONSENT/ [facility] WILL UPDATE   Care Team Notified: [facility] Bretta Qua RN, Patti Olden MD   [place] Millena Callins LCSW-A   07/28/2024 10:35 AM
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
## 2024-07-28 NOTE — Group Note (Signed)
 Date:  07/28/2024 Time:  3:57 PM  Group Topic/Focus: Sleep Hygiene Education Dimensions of Wellness:   The focus of this group is to introduce the topic of wellness and discuss the role each dimension of wellness plays in total health.    Participation Level:  Did Not Attend   Melinda Jimenez 07/28/2024, 3:57 PM

## 2024-07-28 NOTE — [facility] Group Notes (Signed)
 Melinda Jimenez did not attend wrap up group

## 2024-07-28 NOTE — ED Notes (Signed)
 Patient A&O x 4, ambulatory. Patient transferred to [facility] via safe transport in no acute distress. Patient denied SI/HI, A/VH upon discharge. Pt belongings given to safe transport driver from locker #88 intact. Patient escorted to [place] via staff for transport to [facility]. Safety maintained.

## 2024-07-28 NOTE — ED Notes (Signed)
 Patient A&Ox4. Denies intent to harm self/others when asked. Denies A/VH. Patient denies any physical complaints when asked. Flat affect. Give minimal responses to questions asked. Routine safety checks conducted according to facility protocol. Encouraged patient to notify staff if thoughts of harm toward self or others arise. Patient verbalize understanding and agreement. Will continue to monitor for safety.

## 2024-07-29 ENCOUNTER — Encounter (HOSPITAL_COMMUNITY): Payer: Self-pay

## 2024-07-29 DIAGNOSIS — F603 Borderline personality disorder: Secondary | ICD-10-CM

## 2024-07-29 DIAGNOSIS — F411 Generalized anxiety disorder: Secondary | ICD-10-CM

## 2024-07-29 DIAGNOSIS — F4310 Post-traumatic stress disorder, unspecified: Secondary | ICD-10-CM

## 2024-07-29 DIAGNOSIS — F319 Bipolar disorder, unspecified: Principal | ICD-10-CM

## 2024-07-29 LAB — LIPID PANEL
Cholesterol: 116 mg/dL (ref 0–200)
HDL: 33 mg/dL — ABNORMAL LOW (ref >40)
LDL Cholesterol: 71 mg/dL (ref 0–99)
Total CHOL/HDL Ratio: 3.5 ratio
Triglycerides: 56 mg/dL (ref <150)
VLDL: 11 mg/dL (ref 0–40)

## 2024-07-29 MED ORDER — ARIPIPRAZOLE 5 MG PO TABS
5.0000 mg | ORAL_TABLET | Freq: Every day | ORAL | Status: DC
  Filled 2024-07-29: qty 1

## 2024-07-29 MED ORDER — NIFEDIPINE ER OSMOTIC RELEASE 30 MG PO TB24
30.0000 mg | ORAL_TABLET | Freq: Every day | ORAL | Status: DC
  Filled 2024-07-29: qty 1

## 2024-07-29 MED ORDER — FLUOXETINE HCL 10 MG PO CAPS
10.0000 mg | ORAL_CAPSULE | Freq: Every day | ORAL | Status: DC
  Filled 2024-07-29: qty 1

## 2024-07-29 MED ORDER — ARIPIPRAZOLE 10 MG PO TABS: 10.0000 mg | ORAL_TABLET | Freq: Every day | ORAL | Status: DC
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
## 2024-07-29 NOTE — BH IP Treatment Plan (Signed)
 Interdisciplinary Treatment and Diagnostic Plan Update  07/29/2024 Time of Session: 1039AM Melinda Jimenez MRN: [id number]  Principal Diagnosis: Major depressive disorder  Secondary Diagnoses: Principal Problem:   Major depressive disorder   Current Medications:  Current Facility-Administered Medications  Medication Dose Route Frequency Provider Last Rate Last Admin   acetaminophen  (TYLENOL ) tablet 650 mg  650 mg Oral Q6H PRN Rainey, Donovan, MD       alum & mag hydroxide-simeth (MAALOX/MYLANTA) 200-200-20 MG/5ML suspension 30 mL  30 mL Oral Q4H PRN Lenard Calin, MD       haloperidol  (HALDOL ) tablet 5 mg  5 mg Oral TID PRN Lenard Calin, MD       And   diphenhydrAMINE  (BENADRYL ) capsule 50 mg  50 mg Oral TID PRN Lenard Calin, MD       haloperidol  lactate (HALDOL ) injection 10 mg  10 mg Intramuscular TID PRN Lenard Calin, MD       And   diphenhydrAMINE  (BENADRYL ) injection 50 mg  50 mg Intramuscular TID PRN Lenard Calin, MD       And   LORazepam  (ATIVAN ) injection 2 mg  2 mg Intramuscular TID PRN Lenard Calin, MD       haloperidol  lactate (HALDOL ) injection 5 mg  5 mg Intramuscular TID PRN Lenard Calin, MD       And   diphenhydrAMINE  (BENADRYL ) injection 50 mg  50 mg Intramuscular TID PRN Lenard Calin, MD       And   LORazepam  (ATIVAN ) injection 2 mg  2 mg Intramuscular TID PRN Lenard Calin, MD       hydrOXYzine  (ATARAX ) tablet 25 mg  25 mg Oral TID PRN Lenard Calin, MD       ibuprofen  (ADVIL ) tablet 400 mg  400 mg Oral Q6H PRN Lenard Calin, MD       traZODone  (DESYREL ) tablet 50 mg  50 mg Oral QHS PRN Lenard Calin, MD       PTA Medications: Medications Prior to Admission  Medication Sig Dispense Refill Last Dose/Taking   ARIPiprazole  (ABILIFY ) 10 MG tablet Take 1 tablet (10 mg total) by mouth at bedtime. (Patient not taking: Reported on 07/28/2024) 30 tablet 0    ibuprofen  (ADVIL ) 200 MG tablet Take 400 mg by mouth every 6 (six) hours as needed  for headache.      [Paused] NIFEdipine  (PROCARDIA -XL/NIFEDICAL-XL) 30 MG 24 hr tablet Take 1 tablet (30 mg total) by mouth daily. 30 tablet 0     Patient Stressors: Financial difficulties   Marital or family conflict    Patient Strengths: Average or above average intelligence   Treatment Modalities: Medication Management, Group therapy, Case management,  1 to 1 session with clinician, Psychoeducation, Recreational therapy.   Physician Treatment Plan for Primary Diagnosis: Major depressive disorder Long Term Goal(s):     Short Term Goals:    Medication Management: Evaluate patient's response, side effects, and tolerance of medication regimen.  Therapeutic Interventions: 1 to 1 sessions, Unit Group sessions and Medication administration.  Evaluation of Outcomes: Not Progressing  Physician Treatment Plan for Secondary Diagnosis: Principal Problem:   Major depressive disorder  Long Term Goal(s):     Short Term Goals:       Medication Management: Evaluate patient's response, side effects, and tolerance of medication regimen.  Therapeutic Interventions: 1 to 1 sessions, Unit Group sessions and Medication administration.  Evaluation of Outcomes: Not Progressing   RN Treatment Plan for Primary Diagnosis: Major depressive disorder Long Term Goal(s): Knowledge of disease and  therapeutic regimen to maintain health will improve  Short Term Goals: Ability to remain free from injury will improve, Ability to verbalize frustration and anger appropriately will improve, Ability to demonstrate self-control, Ability to participate in decision making will improve, Ability to verbalize feelings will improve, Ability to disclose and discuss suicidal ideas, Ability to identify and develop effective coping behaviors will improve, and Compliance with prescribed medications will improve  Medication Management: RN will administer medications as ordered by provider, will assess and evaluate patient's  response and provide education to patient for prescribed medication. RN will report any adverse and/or side effects to prescribing provider.  Therapeutic Interventions: 1 on 1 counseling sessions, Psychoeducation, Medication administration, Evaluate responses to treatment, Monitor vital signs and CBGs as ordered, Perform/monitor CIWA, COWS, AIMS and Fall Risk screenings as ordered, Perform wound care treatments as ordered.  Evaluation of Outcomes: Not Progressing   LCSW Treatment Plan for Primary Diagnosis: Major depressive disorder Long Term Goal(s): Safe transition to appropriate next level of care at discharge, Engage patient in therapeutic group addressing interpersonal concerns.  Short Term Goals: Engage patient in aftercare planning with referrals and resources, Increase social support, Increase ability to appropriately verbalize feelings, Increase emotional regulation, Facilitate acceptance of mental health diagnosis and concerns, Facilitate patient progression through stages of change regarding substance use diagnoses and concerns, Identify triggers associated with mental health/substance abuse issues, and Increase skills for wellness and recovery  Therapeutic Interventions: Assess for all discharge needs, 1 to 1 time with Social worker, Explore available resources and support systems, Assess for adequacy in community support network, Educate family and significant other(s) on suicide prevention, Complete Psychosocial Assessment, Interpersonal group therapy.  Evaluation of Outcomes: Not Progressing   Progress in Treatment: Attending groups: No. Participating in groups: No. Taking medication as prescribed: none prescribed at this time Toleration medication: NA Family/Significant other contact made: No, will contact:  declined consents Patient understands diagnosis: Yes. Discussing patient identified problems/goals with staff: Yes. Medical problems stabilized or resolved: Yes. Denies  suicidal/homicidal ideation: Yes. Issues/concerns per patient self-inventory: No.  New problem(s) identified: No, Describe:  none  New Short Term/Long Term Goal(s): medication stabilization, elimination of SI thoughts, development of comprehensive mental wellness plan.    Patient Goals:  Anger management  Discharge Plan or Barriers: Patient recently admitted. CSW will continue to follow and assess for appropriate referrals and possible discharge planning.    Reason for Continuation of Hospitalization: Depression Medication stabilization Suicidal ideation  Estimated Length of Stay: 5-7 days  Last 3 [place] Suicide Severity Risk Score: Flowsheet Row Ad[facility] (Current) [facility] from 07/27/2024 in [facility] from 07/10/2024 in [facility]  C-SSRS RISK CATEGORY High Risk High Risk Low Risk    Last [facility] 2/9 Scores:    02/17/2020   10:08 AM 05/14/2017    1:05 PM  Depression screen PHQ 2/9  Decreased Interest 3 0  Down, Depressed, Hopeless 3 0  PHQ - 2 Score 6 0  Altered sleeping 3 0  Tired, decreased energy 3 3  Change in appetite 1 0  Feeling bad or failure about yourself  3 0  Trouble concentrating 3 0  Moving slowly or fidgety/restless 2 0  Suicidal thoughts 2 0  PHQ-9 Score 23 3    Scribe for Treatment Team: Jenkins LULLA Primer, LCSWA 07/29/2024 11:43 AM

## 2024-07-29 NOTE — Progress Notes (Signed)
   07/28/24 2045  Psych Admission Type (Psych Patients Only)  Admission Status Voluntary  Psychosocial Assessment  Patient Complaints None  Eye Contact Brief  Facial Expression Flat  Affect Irritable;Labile  Speech Logical/coherent  Interaction Guarded;Minimal  Motor Activity Slow  Appearance/Hygiene Unremarkable  Behavior Characteristics Cooperative;Guarded  Mood Irritable;Labile  Thought Process  Coherency WDL  Content Paranoia  Delusions None reported or observed  Perception WDL  Hallucination None reported or observed  Judgment Impaired  Confusion None  Danger to Self  Current suicidal ideation? Denies  Agreement Not to Harm Self Yes  Description of Agreement Verbal  Danger to Others  Danger to Others None reported or observed

## 2024-07-29 NOTE — Plan of Care (Signed)
   Problem: Education: Goal: Emotional status will improve Outcome: Progressing Goal: Mental status will improve Outcome: Progressing Goal: Verbalization of understanding the information provided will improve Outcome: Progressing

## 2024-07-29 NOTE — Group Note (Signed)
 Recreation Therapy Group Note   Group Topic:Problem Solving  Group Date: 07/29/2024 Start Time: 0945 End Time: 1005 Facilitators: Ivey Cina-McCall, LRT,CTRS Location: [address] Dayroom   Group Topic: Problem Solving  Goal Area(s) Addresses:  Patient will effectively work in a team with other group members. Patient will verbalize importance of using appropriate problem solving techniques.  Patient will identify positive change associated with effective problem solving skills.   Behavioral Response:   Intervention: Worksheet  Activity: Dentist. Patients were given a sheet front and back of brain teasers. Patients worked together (or alone) to figure out each puzzle presented.     Education: Problem solving as an important skill in daily life  Education Outcome: Acknowledges understanding/In group clarification offered/Needs additional education.    Affect/Mood: N/A   Participation Level: Did not attend    Clinical Observations/Individualized Feedback:      Plan: Continue to engage patient in RT group sessions 2-3x/week.   Zyrah Wiswell-McCall, LRT,CTRS  07/29/2024 1:43 PM

## 2024-07-29 NOTE — Group Note (Signed)
 Date:  07/29/2024 Time:  12:03 PM  Group Topic/Focus: Recreational Therapy  Participation Level:  Did Not Attend   Melinda Jimenez 07/29/2024, 12:03 PM

## 2024-07-29 NOTE — Group Note (Signed)
 Date:  07/29/2024 Time:  11:23 AM  Group Topic/Focus:  Developing a Wellness Toolbox:   The focus of this group is to help patients develop a wellness toolbox with skills and strategies to promote recovery upon discharge. Orientation:   The focus of this group is to educate the patient on the purpose and policies of crisis stabilization and provide a format to answer questions about their admission.  The group details unit policies and expectations of patients while admitted.  Participation Level:  Minimal   Additional Comments:  Patient was in towards the end, for like five mins and left  [employer] 07/29/2024, 11:23 AM

## 2024-07-29 NOTE — Group Note (Signed)
 Date:  07/29/2024 Time:  4:34 PM  Group Topic/Focus:  Dimensions of Wellness:   The focus of this group is to introduce the topic of wellness and discuss the role each dimension of wellness plays in total health.    Participation Level:  Active  Participation Quality:  Appropriate  Affect:  Appropriate  Cognitive:  Appropriate  Insight: Appropriate  Engagement in Group:  Engaged  Modes of Intervention:  Discussion   Melinda Jimenez  Melinda Jimenez 07/29/2024, 4:34 PM

## 2024-07-29 NOTE — Group Note (Signed)
 Date:  07/29/2024 Time:  1:55 PM  Group Topic/Focus: Strength. Chapline Group The focus of this group is to identify what feelings patients have difficulty handling and develop a plan to handle them in a healthier way upon discharge.    Participation Level:  Did Not Attend   Melinda Jimenez 07/29/2024, 1:55 PM

## 2024-07-29 NOTE — Progress Notes (Signed)
 Tour of Duty:  Prentice JINNY Angle, RN, 07/29/24, Tour of Duty: 0700-1900  SI/HI/AVH: Denies  Self-Reported   Mood: Negative  Anxiety: Denies, but Observable Depression: Denies, but Observable Irritability: Denies, but Observable  Broset  Violence Prevention Guidelines *See Row Information*: Moderate Violence Risk interventions implemented   LBM  Last BM Date : 07/28/24   Pain: not present  Patient Refusals (including Rx): No  >>Shift Summary: Patient observed to be anxious and irritable on unit. Patient able to make needs known. Patient observed to engage appropriately with staff and peers, but little to no initiation and very guarded. Patient refusing medications as prescribed. This shift, no PRN medication requested or required. No reported or observed side effects to medication. No reported or observed agitation, aggression, or other acute emotional distress. No reported or observed physical abnormalities or concerns.  Last Vitals  Vitals Weight: 99.3 kg Temp: (!) 97.4 F (36.3 C) Temp Source: Oral Pulse Rate: 86 Resp: 18 BP: (!) 137/90 Patient Position: (not recorded)  Admission Type  Psych Admission Type (Psych Patients Only) Admission Status: Voluntary Date 72 hour document signed : (not recorded) Time 72 hour document signed : (not recorded) Provider Notified (First and Last Name) (see details for LINK to note): (not recorded)   Psychosocial Assessment  Psychosocial Assessment Patient Complaints: None Eye Contact: Brief Facial Expression: Sullen Affect: Irritable, Labile Speech: Logical/coherent Interaction: Guarded, Forwards little Motor Activity: Other (Comment) (WDL) Appearance/Hygiene: Unremarkable Behavior Characteristics: Resistant to care, Agitated, Irritable Mood: Irritable, Labile   Aggressive Behavior  Targets: (not recorded)   Thought Process  Thought Process Coherency: Within Defined Limits Content: Paranoia Delusions: Paranoid,  Persecutory Perception: Within Defined Limits Hallucination: None reported or observed Judgment: Within Defined Limits Confusion: None  Danger to Self/Others  Danger to Self Current suicidal ideation?: Denies Description of Suicide Plan: (not recorded) Self-Injurious Behavior: (not recorded) Agreement Not to Harm Self: (not recorded) Description of Agreement: (not recorded) Danger to Others: None reported or observed

## 2024-07-29 NOTE — [facility] Suicide Risk Assessment (Signed)
 [facility] INPATIENT:  Family/Significant Other Suicide Prevention Education  Suicide Prevention Education:  Patient Refusal for Family/Significant Other Suicide Prevention Education: The patient Melinda Jimenez has refused to provide written consent for family/significant other to be provided Family/Significant Other Suicide Prevention Education during admission and/or prior to discharge.  Physician notified.  Jenkins LULLA Primer 07/29/2024, 1:34 PM
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
## 2024-07-29 NOTE — [facility] Group Notes (Signed)
 Patte did not attend wrap up group

## 2024-07-29 NOTE — [facility] Suicide Risk Assessment (Addendum)
 Suicide Risk Assessment  Admission Assessment    [facility] Admission Suicide Risk Assessment   Nursing information obtained from:  Patient Demographic factors:  Low socioeconomic status, Unemployed Current Mental Status:  Suicidal ideation indicated by patient Loss Factors:  Financial problems / change in socioeconomic status Historical Factors:  Impulsivity Risk Reduction Factors:  NA  Total Time spent with patient: 45 minutes  Principal Problem: Major depressive disorder Diagnosis:  Principal Problem:   Major depressive disorder  Major depressive disorder Bipolar 1 disorder severe Generalized anxiety disorder Borderline personality disorder PTSD  Subjective Data: Melinda Jimenez is a 37 year old African-American female with prior psychiatric history significant for major depressive disorder, generalized anxiety disorder, bipolar 1 disorder depressed severe, PTSD, borderline personality disorder, and PMHx of hypertension, prediabetes, and vitamin D  deficiency.  Patient presented voluntarily to [facility] from [facility] behavioral health urgent care for worsening depression resulting in suicidal ideation in the context of psychosocial stresses including her four children being under the custody of social services.  UDS negative for any substances.  BAL: Less than 15 obtained 03/09/2024.    Continued Clinical Symptoms:  Alcohol Use Disorder Identification Test Final Score (AUDIT): 0 The Alcohol Use Disorders Identification Test, Guidelines for Use in Primary Care, Second Edition.  World Science Writer [facility]). Score between 0-7:  no or low risk or alcohol related problems. Score between 8-15:  moderate risk of alcohol related problems. Score between 16-19:  high risk of alcohol related problems. Score 20 or above:  warrants further diagnostic evaluation for alcohol dependence and treatment.  CLINICAL FACTORS:   Bipolar Disorder:   Mixed State Depression:    Anhedonia Hopelessness Severe More than one psychiatric diagnosis Previous Psychiatric Diagnoses and Treatments Medical Diagnoses and Treatments/Surgeries  Musculoskeletal: Strength & Muscle Tone: within normal limits Gait & Station: normal Patient leans: N/A  Psychiatric Specialty Exam:  Presentation  General Appearance:  Appropriate for Environment; Casual  Eye Contact: Fair  Speech: Clear and Coherent  Speech Volume: Normal  Handedness: Right  Mood and Affect  Mood: Anxious; Depressed  Affect: Congruent  Thought Process  Thought Processes: Coherent; Linear  Descriptions of Associations:Intact  Orientation:Full (Time, Place and Person)  Thought Content:WDL  History of Schizophrenia/Schizoaffective disorder:No  Duration of Psychotic Symptoms:Less than six months  Hallucinations:Hallucinations: None  Ideas of Reference:None  Suicidal Thoughts:SI Active Intent and/or Plan: -- (Denies) SI Passive Intent and/or Plan: -- (Denies)  Homicidal Thoughts:Homicidal Thoughts: No  Sensorium  Memory: Immediate Good; Recent Good  Judgment: Poor  Insight: Poor  Executive Functions  Concentration: Fair  Attention Span: Fair  Recall: Poor  Fund of Knowledge: Fair  Language: Fair   Psychomotor Activity  Psychomotor Activity: Psychomotor Activity: Normal  Assets  Assets: Communication Skills; Physical Health  Sleep  Sleep: Sleep: Fair Number of Hours of Sleep: 4  Physical Exam: Physical Exam Vitals and nursing note reviewed.  Constitutional:      General: She is not in acute distress.    Appearance: She is obese. She is not ill-appearing.  HENT:     Head: Normocephalic.     Right Ear: External ear normal.     Left Ear: External ear normal.     Nose: Nose normal.     Mouth/Throat:     Mouth: Mucous membranes are moist.     Pharynx: Oropharynx is clear.  Eyes:     Extraocular Movements: Extraocular movements intact.   Cardiovascular:     Rate and Rhythm: Normal rate.  Pulses: Normal pulses.  Pulmonary:     Effort: Pulmonary effort is normal.  Abdominal:     Comments: Deferred  Genitourinary:    Comments: Deferred Musculoskeletal:        General: Normal range of motion.     Cervical back: Normal range of motion.  Skin:    General: Skin is dry.  Neurological:     General: No focal deficit present.     Mental Status: She is alert and oriented to person, place, and time.  Psychiatric:     Comments: Guarded    Review of Systems  Constitutional:  Negative for chills and fever.  HENT:  Negative for sore throat.   Eyes:  Negative for blurred vision.  Respiratory:  Negative for cough, sputum production, shortness of breath and wheezing.   Cardiovascular:  Negative for chest pain and palpitations.  Gastrointestinal:  Negative for abdominal pain, constipation, diarrhea, heartburn, nausea and vomiting.  Genitourinary:  Negative for dysuria.  Musculoskeletal:  Negative for falls.  Skin:  Negative for itching and rash.  Neurological:  Negative for dizziness and headaches.  Endo/Heme/Allergies:        See allergy listing  Psychiatric/Behavioral:  Positive for depression. Negative for hallucinations, substance abuse and suicidal ideas. The patient is nervous/anxious and has insomnia.    Blood pressure (!) 131/90, pulse 93, temperature (!) 97.4 F (36.3 C), temperature source Oral, resp. rate 18, height 5' 7 (1.702 m), weight 99.3 kg, SpO2 100%. Body mass index is 34.3 kg/m.  COGNITIVE FEATURES THAT CONTRIBUTE TO RISK:  Polarized thinking    SUICIDE RISK:   Severe:  Frequent, intense, and enduring suicidal ideation, specific plan, no subjective intent, but some objective markers of intent (i.e., choice of lethal method), the method is accessible, some limited preparatory behavior, evidence of impaired self-control, severe dysphoria/symptomatology, multiple risk factors present, and few if any  protective factors, particularly a lack of social support.  PLAN OF CARE: Treatment Plan Summary: Daily contact with patient to assess and evaluate symptoms and progress in treatment and Medication management  Observation Level/Precautions:  15 minute checks  Laboratory:  CBC: Hemoglobin 10.1, HCT 34.4, MCV 69.1, MCH 20.4, MCHC 29.4, RDW 19.6, lymphs absolute 4.3.  Otherwise normal Chemistry Profile: Within normal limit Folic Acid: N/A GGT: N/A HbAIC: 5.9 elevated HCG: UDS: No substances dictated UA: Appearance hazy, hemoglobin urine dipstick: Large, leukocytes: Small protein: 30. Vitamin B-12:  N/A Vitamin D  25 hydroxy: Ordered  EKG: NSR, ventricular rate 72, QT/QTc 388/416  Psychotherapy: Therapeutic value  Medications: MAR  Consultations: Pending  Discharge Concerns: Safety  Estimated LOS: 3 to 7 days  Other:     Assessment: Melinda Jimenez is a 38 year old African-American female with prior psychiatric history significant for major depressive disorder, generalized anxiety disorder, bipolar 1 disorder depressed severe, PTSD, borderline personality disorder, and PMHx of hypertension, prediabetes, and vitamin D  deficiency.  Patient presented voluntarily to [facility] from [facility] behavioral health urgent care for worsening depression resulting in suicidal ideation in the context of psychosocial stresses including her four children being under the custody of social services.  UDS negative for any substances.  BAL: Less than 15 obtained 03/09/2024.    Physician Treatment Plan for Primary Diagnosis: Major depressive disorder  Plans: Medications: --Initiate Prozac  10 mg tablet p.o. daily bedtime for depression --Initiate Abilify  5 mg p.o. daily at bedtime for depression and paranoia --Continue hydroxyzine  25 mg tablet p.o. 3 times daily as needed for anxiety --  Continue trazodone  tablet 50 mg p.o. q. nightly as needed for insomnia   Medications for  other medical conditions: Resume home medication -- Nifedipine  30 mg  24-hour tablets p.o. daily for high blood pressure  Other PRN Medications  -Acetaminophen  650 mg every 6 as needed/mild pain  -Maalox 30 mL oral every 4 as needed/digestion  -Magnesium  hydroxide 30 mL daily as needed/mild constipation   --The risks/benefits/side-effects/alternatives to this medication were discussed in detail with the patient and time was given for questions. The patient consents to medication trial.   -- Metabolic profile and EKG monitoring obtained while on an atypical antipsychotic (BMI: Lipid Panel: HbgA1c: QTc:)   -- Encouraged patient to participate in unit milieu and in scheduled group therapies   Continue BH Agitation Protocol   Safety and Monitoring:  Voluntary admission to inpatient psychiatric unit for safety, stabilization and treatment  Daily contact with patient to assess and evaluate symptoms and progress in treatment  Patient's case to be discussed in multi-disciplinary team meeting  Observation Level : q15 minute checks  Vital signs: q12 hours  Precautions: suicide, but pt currently verbally contracts for safety on unit?   Discharge Planning:  Social work and case management to assist with discharge planning and identification of hospital follow-up needs prior to discharge  Estimated LOS: 5-7?days  Discharge Concerns: Need to establish Safety plan; Medication compliance and effectiveness  Discharge Goals: Return home with outpatient referrals for mental health follow-up including medication management/psychotherapy.   Long Term Goal(s): Improvement in symptoms so as ready for discharge  Short Term Goals: Ability to identify changes in lifestyle to reduce recurrence of condition will improve, Ability to verbalize feelings will improve, Ability to disclose and discuss suicidal ideas, Ability to demonstrate self-control will improve, Ability to identify and develop effective coping  behaviors will improve, Ability to maintain clinical measurements within normal limits will improve, Compliance with prescribed medications will improve, and Ability to identify triggers associated with substance abuse/mental health issues will improve  Physician Treatment Plan for Secondary Diagnosis: Principal Problem: Major depressive disorder Bipolar 1 disorder severe Generalized anxiety disorder Borderline personality disorder PTSD  I certify that inpatient services furnished can reasonably be expected to improve the patient's condition.   Ellouise JAYSON Azure, FNP 07/29/2024, 11:44 AM

## 2024-07-29 NOTE — Group Note (Deleted)
 Date:  07/29/2024 Time:  10:58 AM  Group Topic/Focus:  Developing a Wellness Toolbox:   The focus of this group is to help patients develop a wellness toolbox with skills and strategies to promote recovery upon discharge. Along will doing a spiritual wellness assesment    Participation Level:  {[facility] PARTICIPATION LEVEL:22264}  Participation Quality:  {[facility] PARTICIPATION QUALITY:22265}  Affect:  {[facility] AFFECT:22266}  Cognitive:  {[facility] COGNITIVE:22267}  Insight: {BHH Insight2:20797}  Engagement in Group:  {BHH ENGAGEMENT IN HMNLE:77731}  Modes of Intervention:  {BHH MODES OF INTERVENTION:22269}  Additional Comments:  ***  Terrah Decoster M Alen Matheson 07/29/2024, 10:58 AM

## 2024-07-29 NOTE — Progress Notes (Signed)
(  Sleep Hours) - 4 (Any PRNs that were needed, meds refused, or side effects to meds)- No PRN meds given, no meds refused.  (Any disturbances and when (visitation, over night)- None  (Concerns raised by the patient)- None  (SI/HI/AVH)- Denies SI/HI/AVH

## 2024-07-29 NOTE — Plan of Care (Signed)
   Problem: Education: Goal: Emotional status will improve Outcome: Not Progressing Goal: Mental status will improve Outcome: Not Progressing

## 2024-07-30 LAB — VITAMIN D 25 HYDROXY (VIT D DEFICIENCY, FRACTURES): Vit D, 25-Hydroxy: 11.48 ng/mL — ABNORMAL LOW (ref 30–100)

## 2024-07-30 NOTE — Plan of Care (Signed)

## 2024-07-30 NOTE — Progress Notes (Addendum)
 [facility] MD Progress Note  07/30/2024 11:11 AM ARDATH LEPAK  MRN:  [id number]  Subjective:  Levon JONETTA Sharps states, I do not need too many medications, I need Jesus for my depression.  Principal Problem: Major depressive disorder Diagnosis: Principal Problem:   Major depressive disorder  Reason for admission: Melinda Jimenez is a 38 year old African-American female with prior psychiatric history significant for major depressive disorder, generalized anxiety disorder, bipolar 1 disorder depressed severe, PTSD, borderline personality disorder, and PMHx of hypertension, prediabetes, and vitamin D  deficiency.  Patient presented voluntarily to [facility] from [facility] behavioral health urgent care for worsening depression resulting in suicidal ideation in the context of psychosocial stresses including her four children being under the custody of social services.  UDS negative for any substances.  BAL: Less than 15 obtained 03/09/2024.   24-hour chart review: Patient case reviewed in the interdisciplinary team meeting.  Vital signs reviewed without critical values, blood pressure 133/99, heart rate 94.  Nursing staff report patient refusing blood pressure medications.  No as needed or agitation protocol required.  Patient noncompliant with her psychotropic medications.  Today's assessment notes: On assessment today, the pt reports that their mood is less depressed compared to admission.  Rates depression as #2/10, with 10 being high severity.  Speech clear, coherent with normal volume and pattern. Reports she does not want Abilify  due to hearing a rap-music about Abilify  and how it works for the mental health patients.  Education provided on Abilify  and how it augments patient depressive symptoms and could work as an antipsychotic.  Patient reports she understood this concept however, she will not take it.  Added, I was discharged with Abilify  in June 2025, however, I refused to  take it after discharge.  Reports, Prozac  sounds like addictive medication, and I will not take it. Alternative medication for depression and anxiety we offered, however, patient is not receptive at this time.  We will hold off on antidepressant at this time due to mood fluctuations.  We will consider adding a mood stabilizer the treatment regimen if patient will agree to take a mood stabilizer.  She reports dosing of Procardia  30 mg, 24-hour tablet for high blood pressure is too high.  Made patient aware that this is the lowest dose in this drug classification.  Then finally added, I do not want too many medications, all I ever needed is Jesus.  She denies delusional thinking, but appears paranoid.  Patient further denies SI, HI, or AVH and appears to be minimizing her symptoms.  Nursing staff report patient signing 72 hours form for discharge.  Staff will continue to monitor patient for safety. Reports that anxiety is at manageable level and rated anxiety as # 0/10, with 10 being high severity Nursing staff report patient sleeping 3.25 hours, however patient reports sleeping over 7 hours last night. Appetite is stable Concentration is improving Energy level is adequate Denies suicidal thoughts.  Further denies suicidal intent and plan.  Denies having any HI.  Denies having psychotic symptoms.   Denies having side effects to current psychiatric medications.   We discussed compliance to current medication regimen.  Total Time spent with patient: 45 minutes  Past Psychiatric History: Previous Psych Diagnoses: MDD, bipolar disorder, and generalized anxiety. Prior inpatient treatment: X 2, most recently at [facility] in June 2025 Current/prior outpatient treatment: Denies Prior rehab hx: Denies Psychotherapy hx: Yes History of suicide: Denies History of homicide or aggression: Denies Psychiatric medication history: Yes,  Abilify  trazodone  and hydroxyzine  Psychiatric medication compliance history:  Noncompliance Neuromodulation history: denies Current Psychiatrist: Denies Current therapist: Denies  Past Medical History:  Past Medical History:  Diagnosis Date   Anemia    Anxiety    off meds with preg   Bipolar 1 disorder (HCC) 03/10/2024   Borderline personality disorder (HCC) 03/10/2024   Brief psychotic disorder (HCC) 04/03/2020   Carpal tunnel syndrome    Cesarean delivery delivered 10/10/2020   Chlamydia    Chronic hypertension    Chronic post-traumatic stress disorder (PTSD) 03/10/2024   COVID-19 09/12/2020   Genital HSV    Last OB: > 94yr ago; Valtrex  500 mg po prn outbreaks   GERD (gastroesophageal reflux disease)    GERD without esophagitis 01/20/2013   Gestational diabetes    diet controlled   Goiter, unspecified 04/18/2010   Centricity Description: THYROMEGALY  Qualifier: Diagnosis of   By: Lelon RIGGERS, Scott      Centricity Description: GOITER  Qualifier: Diagnosis of   By: Lelon RIGGERS, Scott      Replacing diagnoses that were inactivated after the 12/31/22 regulatory import     H/O: cesarean section 09/27/2016   last C-section for active HSV, C-section X1: 05/20/13; records in [facility]  TOLAC desired, signed 09/27/16      Headache(784.0)    Herpes genitalis in women 04/18/2010   Qualifier: Diagnosis of   By: Lelon RIGGERS, Scott         IBS (irritable bowel syndrome)    Infection    UTI   Intrauterine pregnancy 03/22/2020   EDC 11/14/2020     Panic attack    Premature rupture of membranes (PROM), onset of labor after 24 hours, antepartum, preterm, third trimester 10/10/2020   Pulmonary embolus (HCC) 10/18/2020   Thyroid  disease    Trichomonas    Vitamin D  deficiency 03/10/2024    Past Surgical History:  Procedure Laterality Date   CESAREAN SECTION N/A 05/20/2013   Procedure: CESAREAN SECTION;  Surgeon: Carlin DELENA Centers, MD;  Location: [facility] ORS;  Service: Obstetrics;  Laterality: N/A;   CESAREAN SECTION N/A 04/06/2017   Procedure: CESAREAN SECTION;  Surgeon:  Lorence Ozell CROME, MD;  Location: [facility] BIRTHING SU[facility];  Service: Obstetrics;  Laterality: N/A;   CESAREAN SECTION  10/10/2020   Procedure: CESAREAN SECTION;  Surgeon: Zina Jerilynn DELENA, MD;  Location: [facility] LD ORS;  Service: Obstetrics;;   INDUCED ABORTION  2009   VAGINAL DELIVERY  2007   Family History:  Family History  Problem Relation Age of Onset   Depression Mother    Anxiety disorder Mother    Breast cancer Other    Bipolar disorder Brother    Diabetes Maternal Grandmother    Cancer Maternal Grandmother    Hypertension Maternal Grandmother    Hypertension Paternal Grandmother    Autism Other    Schizophrenia Other    Family Psychiatric  History: H&P Social History:  Social History   Substance and Sexual Activity  Alcohol Use No   Comment: Twice a month     Social History   Substance and Sexual Activity  Drug Use No    Social History   Socioeconomic History   Marital status: Single    Spouse name: Not on file   Number of children: 1   Years of education: Not on file   Highest education level: Not on file  Occupational History   Occupation: Unemployed  Tobacco Use   Smoking status: Passive Smoke Exposure - Never Smoker  Smokeless tobacco: Never   Tobacco comments:    was not a daily smoker, tried it  Vaping Use   Vaping status: Never Used  Substance and Sexual Activity   Alcohol use: No    Comment: Twice a month   Drug use: No   Sexual activity: Yes    Birth control/protection: None    Comment: Patient is interested in implant  Other Topics Concern   Not on file  Social History Narrative   Not on file   Social Drivers of Health   Financial Resource Strain: Not on file  Food Insecurity: Food Insecurity Present (07/28/2024)   Hunger Vital Sign    Worried About Running Out of Food in the Last Year: Sometimes true    Ran Out of Food in the Last Year: Sometimes true  Transportation Needs: No Transportation Needs (07/28/2024)   PRAPARE - Therapist, Art (Medical): No    Lack of Transportation (Non-Medical): No  Physical Activity: Not on file  Stress: Not on file  Social Connections: Unknown (02/05/2022)   Received from [facility]   Social Network    Social Network: Not on file   Additional Social History:     Sleep: Poor Estimated Sleeping Duration (Last 24 Hours): 2.75-3.00 hours  Appetite:  Good  Current Medications: Current Facility-Administered Medications  Medication Dose Route Frequency Provider Last Rate Last Admin   acetaminophen  (TYLENOL ) tablet 650 mg  650 mg Oral Q6H PRN Rainey, Donovan, MD       alum & mag hydroxide-simeth (MAALOX/MYLANTA) 200-200-20 MG/5ML suspension 30 mL  30 mL Oral Q4H PRN Lenard Calin, MD       ARIPiprazole  (ABILIFY ) tablet 5 mg  5 mg Oral Daily Teyton Pattillo C, FNP       haloperidol  (HALDOL ) tablet 5 mg  5 mg Oral TID PRN Lenard Calin, MD       And   diphenhydrAMINE  (BENADRYL ) capsule 50 mg  50 mg Oral TID PRN Lenard Calin, MD       haloperidol  lactate (HALDOL ) injection 10 mg  10 mg Intramuscular TID PRN Lenard Calin, MD       And   diphenhydrAMINE  (BENADRYL ) injection 50 mg  50 mg Intramuscular TID PRN Lenard Calin, MD       And   LORazepam  (ATIVAN ) injection 2 mg  2 mg Intramuscular TID PRN Lenard Calin, MD       haloperidol  lactate (HALDOL ) injection 5 mg  5 mg Intramuscular TID PRN Lenard Calin, MD       And   diphenhydrAMINE  (BENADRYL ) injection 50 mg  50 mg Intramuscular TID PRN Lenard Calin, MD       And   LORazepam  (ATIVAN ) injection 2 mg  2 mg Intramuscular TID PRN Lenard Calin, MD       FLUoxetine  (PROZAC ) capsule 10 mg  10 mg Oral Daily Jaylenn Baiza C, FNP       hydrOXYzine  (ATARAX ) tablet 25 mg  25 mg Oral TID PRN Lenard Calin, MD       ibuprofen  (ADVIL ) tablet 400 mg  400 mg Oral Q6H PRN Lenard Calin, MD       NIFEdipine  (PROCARDIA -XL/NIFEDICAL-XL) 24 hr tablet 30 mg  30 mg Oral Daily Jabreel Chimento C, FNP       traZODone   (DESYREL ) tablet 50 mg  50 mg Oral QHS PRN Lenard Calin, MD       Lab Results:  Results for orders placed or performed during the  hospital encounter of 07/28/24 (from the past 48 hours)  Lipid panel     Status: Abnormal   Collection Time: 07/29/24  6:20 AM  Result Value Ref Range   Cholesterol 116 0 - 200 mg/dL    Comment:        ATP III CLASSIFICATION:  <200     mg/dL   Desirable  799-760  mg/dL   Borderline High  >=759    mg/dL   High           Triglycerides 56 <150 mg/dL   HDL 33 (L) >59 mg/dL   Total CHOL/HDL Ratio 3.5 RATIO   VLDL 11 0 - 40 mg/dL   LDL Cholesterol 71 0 - 99 mg/dL    Comment:        Total Cholesterol/HDL:CHD Risk Coronary Heart Disease Risk Table                     Men   Women  1/2 Average Risk   3.4   3.3  Average Risk       5.0   4.4  2 X Average Risk   9.6   7.1  3 X Average Risk  23.4   11.0        Use the calculated Patient Ratio above and the CHD Risk Table to determine the patient's CHD Risk.        ATP III CLASSIFICATION (LDL):  <100     mg/dL   Optimal  899-870  mg/dL   Near or Above                    Optimal  130-159  mg/dL   Borderline  839-810  mg/dL   High  >809     mg/dL   Very High Performed at [facility], [address], [place], [place] [address]   VITAMIN D  25 Hydroxy (Vit-D Deficiency, Fractures)     Status: Abnormal   Collection Time: 07/30/24  6:33 AM  Result Value Ref Range   Vit D, 25-Hydroxy 11.48 (L) 30 - 100 ng/mL    Comment: (NOTE) Vitamin D  deficiency has been defined by the Institute of Medicine  and an Endocrine Society practice guideline as a level of serum 25-OH  vitamin D  less than 20 ng/mL (1,2). The Endocrine Society went on to  further define vitamin D  insufficiency as a level between 21 and 29  ng/mL (2).  1. IOM (Institute of Medicine). 2010. Dietary reference intakes for  calcium  and D. [place]: [employer]. 2. Holick MF, Binkley Turlock, Bischoff-Ferrari HA,  et al. Evaluation,  treatment, and prevention of vitamin D  deficiency: an Endocrine  Society clinical practice guideline, JCEM. 2011 Jul; 96(7): 1911-30.  Performed at [facility], [address], [place], [place] [address]    Blood Alcohol level:  Lab Results  Component Value Date   [facility] <15 03/09/2024   ETH <10 11/26/2022   Metabolic Disorder Labs: Lab Results  Component Value Date   HGBA1C 5.9 (H) 07/28/2024   MPG 122.63 07/28/2024   MPG 134.11 03/09/2024   No results found for: PROLACTIN Lab Results  Component Value Date   CHOL 116 07/29/2024   TRIG 56 07/29/2024   HDL 33 (L) 07/29/2024   CHOLHDL 3.5 07/29/2024   VLDL 11 07/29/2024   LDLCALC 71 07/29/2024   LDLCALC 93 03/09/2024   Physical Findings: AIMS:  ,  ,  ,  ,  ,  ,  CIWA:    COWS:     Musculoskeletal: Strength & Muscle Tone: within normal limits Gait & Station: normal Patient leans: N/A  Psychiatric Specialty Exam:  Presentation  General Appearance:  Appropriate for Environment; Casual  Eye Contact: Good  Speech: Clear and Coherent  Speech Volume: Normal  Handedness: Right  Mood and Affect  Mood: Anxious; Depressed  Affect: Appropriate  Thought Process  Thought Processes: Coherent  Descriptions of Associations:Intact  Orientation:Full (Time, Place and Person)  Thought Content:WDL  History of Schizophrenia/Schizoaffective disorder:No  Duration of Psychotic Symptoms:Less than six months  Hallucinations:Hallucinations: None  Ideas of Reference:None  Suicidal Thoughts:Suicidal Thoughts: No SI Active Intent and/or Plan: -- (Denies) SI Passive Intent and/or Plan: -- (denies)  Homicidal Thoughts:Homicidal Thoughts: No  Sensorium  Memory: Immediate Good; Recent Good  Judgment: Poor  Insight: Poor  Executive Functions  Concentration: Fair  Attention Span: Fair  Recall: Poor  Fund of Knowledge: Fair  Language: Fair  Psychomotor Activity   Psychomotor Activity: Psychomotor Activity: Normal  Assets  Assets: Communication Skills; Physical Health; Resilience  Sleep  Sleep: Sleep: Poor Number of Hours of Sleep: 3.25 (Patient reported during evaluation stating,  I slept more than 7 hours.)  Physical Exam: Physical Exam Vitals and nursing note reviewed.  Constitutional:      General: She is not in acute distress.    Appearance: She is obese. She is not ill-appearing.  HENT:     Head: Normocephalic.     Right Ear: External ear normal.     Left Ear: External ear normal.     Nose: Nose normal.     Mouth/Throat:     Mouth: Mucous membranes are moist.     Pharynx: Oropharynx is clear.  Eyes:     Extraocular Movements: Extraocular movements intact.  Cardiovascular:     Rate and Rhythm: Normal rate.     Pulses: Normal pulses.  Pulmonary:     Effort: Pulmonary effort is normal. No respiratory distress.  Abdominal:     Comments: Deferred  Genitourinary:    Comments: Deferred Musculoskeletal:        General: Normal range of motion.     Cervical back: Normal range of motion.  Skin:    General: Skin is dry.  Neurological:     General: No focal deficit present.     Mental Status: She is alert and oriented to person, place, and time.  Psychiatric:        Mood and Affect: Mood normal.        Behavior: Behavior normal.    Review of Systems  Constitutional:  Negative for chills and fever.  HENT:  Negative for sore throat.   Eyes:  Negative for blurred vision.  Respiratory:  Negative for cough, sputum production, shortness of breath and wheezing.   Cardiovascular:  Negative for chest pain and palpitations.  Gastrointestinal:  Negative for abdominal pain, constipation, diarrhea, heartburn, nausea and vomiting.  Genitourinary:  Negative for dysuria.  Musculoskeletal:  Negative for falls.  Skin:  Negative for itching and rash.  Neurological:  Negative for dizziness and headaches.  Endo/Heme/Allergies:        See  allergy listing  Psychiatric/Behavioral:  Positive for depression. Negative for hallucinations, substance abuse and suicidal ideas. The patient is nervous/anxious and has insomnia.    Blood pressure (!) 133/99, pulse 94, temperature 98.4 F (36.9 C), temperature source Oral, resp. rate 18, height 5' 7 (1.702 m), weight 99.3 kg, SpO2 96%. Body mass index is 34.3  kg/m.  Treatment Plan Summary: Daily contact with patient to assess and evaluate symptoms and progress in treatment and Medication management  Observation Level/Precautions:  15 minute checks  Laboratory:  CBC: Hemoglobin 10.1, HCT 34.4, MCV 69.1, MCH 20.4, MCHC 29.4, RDW 19.6, lymphs absolute 4.3.  Otherwise normal Chemistry Profile: Within normal limit Folic Acid: N/A GGT: N/A HbAIC: 5.9 elevated HCG: Urine pregnancy test negative UDS: No substances dictated UA: Appearance hazy, hemoglobin urine dipstick: Large, leukocytes: Small protein: 30. Vitamin B-12:  N/A   EKG: NSR, ventricular rate 72, QT/QTc 388/416  Psychotherapy: Therapeutic value  Medications: MAR  Consultations: Pending  Discharge Concerns: Safety  Estimated LOS: 3 to 7 days  Other:      Assessment: RONI FRIBERG is a 38 year old African-American female with prior psychiatric history significant for major depressive disorder, generalized anxiety disorder, bipolar 1 disorder depressed severe, PTSD, borderline personality disorder, and PMHx of hypertension, prediabetes, and vitamin D  deficiency.  Patient presented voluntarily to [facility] from [facility] behavioral health urgent care for worsening depression resulting in suicidal ideation in the context of psychosocial stresses including her four children being under the custody of social services.  UDS negative for any substances.  BAL: Less than 15 obtained 03/09/2024.     Physician Treatment Plan for Primary Diagnosis: Major depressive disorder   Plans: Medications: --Hold  Prozac  10 mg tablet p.o. daily bedtime for depression due to mood fluctuation --Continue Abilify  5 mg p.o. daily at bedtime for depression and paranoia --Continue hydroxyzine  25 mg tablet p.o. 3 times daily as needed for anxiety --Continue trazodone  tablet 50 mg p.o. q. nightly as needed for insomnia    Medications for other medical conditions: Resume home medication -- Continue Nifedipine  30 mg  24-hour tablets p.o. daily for high blood pressure   Other PRN Medications  -Acetaminophen  650 mg every 6 as needed/mild pain  -Maalox 30 mL oral every 4 as needed/digestion  -Magnesium  hydroxide 30 mL daily as needed/mild constipation    --The risks/benefits/side-effects/alternatives to this medication were discussed in detail with the patient and time was given for questions. The patient consents to medication trial.   -- Metabolic profile and EKG monitoring obtained while on an atypical antipsychotic (BMI: Lipid Panel: HbgA1c: QTc:)   -- Encouraged patient to participate in unit milieu and in scheduled group therapies    Continue BH Agitation Protocol    Safety and Monitoring:  Voluntary admission to inpatient psychiatric unit for safety, stabilization and treatment  Daily contact with patient to assess and evaluate symptoms and progress in treatment  Patient's case to be discussed in multi-disciplinary team meeting  Observation Level : q15 minute checks  Vital signs: q12 hours  Precautions: suicide, but pt currently verbally contracts for safety on unit?    Discharge Planning:  Social work and case management to assist with discharge planning and identification of hospital follow-up needs prior to discharge  Estimated LOS: 5-7?days  Discharge Concerns: Need to establish Safety plan; Medication compliance and effectiveness  Discharge Goals: Return home with outpatient referrals for mental health follow-up including medication management/psychotherapy.    Long Term Goal(s): Improvement in  symptoms so as ready for discharge   Short Term Goals: Ability to identify changes in lifestyle to reduce recurrence of condition will improve, Ability to verbalize feelings will improve, Ability to disclose and discuss suicidal ideas, Ability to demonstrate self-control will improve, Ability to identify and develop effective coping behaviors will improve, Ability to maintain clinical measurements  within normal limits will improve, Compliance with prescribed medications will improve, and Ability to identify triggers associated with substance abuse/mental health issues will improve   Physician Treatment Plan for Secondary Diagnosis: Principal Problem:   Major depressive disorder  Bipolar 1 disorder severe Generalized anxiety disorder Borderline personality disorder PTSD   I certify that inpatient services furnished can reasonably be expected to improve the patient's condition.    Ellouise JAYSON Azure, FNP 07/30/2024, 11:11 AM

## 2024-07-30 NOTE — B[facility] Group Notes (Signed)
 Psychoeducational Group Note  Date:  07/30/2024 Time:  2241  Group Topic/Focus:  Wrap-Up Group:   The focus of this group is to help patients review their daily goal of treatment and discuss progress on daily workbooks.  Participation Level: Did Not Attend  Participation Quality:  Not Applicable  Affect:  Not Applicable  Cognitive:  Not Applicable  Insight:  Not Applicable  Engagement in Group: Not Applicable  Additional Comments:  The patient did not attend group this evening.   Emmerich Cryer S 07/30/2024, 10:43 PM

## 2024-07-30 NOTE — Group Note (Signed)
 LCSW Group Therapy Note   Group Date: 07/30/2024 Start Time: 1100 End Time: 1200   Participation:  did not attend  Type of Therapy:  Group Therapy  Topic:  Stress Less:  Nurturing Your Mind and Body Through Calm   Objective:  Learn techniques for managing stress through body relaxation, mindfulness, and self-compassion.  Goals: Use body relaxation techniques, such as Box Breathing and Progressive Muscle Relaxation, to reduce physical tension. Practice mindfulness to break the cycle of overthinking and mental chatter. Embrace self-compassion to handle stress with kindness and resilience.  Summary:  Today's session focused on calming the body with relaxation techniques, breaking the cycle of stress with mindfulness, and using self-compassion to manage challenges more gracefully. These tools help reduce stress and foster a balanced, peaceful mindset.  Therapeutic Modalities used:  Elements of CBT ( cognitive restructuring)  Elements of DBT (box breathing, progressive body relaxation, mindfulness, acceptance)    Melinda Jimenez, LCSWA 07/30/2024  12:31 PM

## 2024-07-30 NOTE — Progress Notes (Signed)
   07/29/24 2145  Psych Admission Type (Psych Patients Only)  Admission Status Voluntary  Psychosocial Assessment  Patient Complaints None  Eye Contact Fair  Facial Expression Animated  Affect Labile  Speech Logical/coherent  Interaction Isolative;Guarded  Motor Activity Other (Comment) (WDL)  Appearance/Hygiene Unremarkable  Behavior Characteristics Guarded  Mood Labile;Pleasant  Thought Process  Coherency WDL  Content Paranoia  Delusions None reported or observed  Perception WDL  Hallucination None reported or observed  Judgment Impaired  Confusion None  Danger to Self  Current suicidal ideation? Denies  Agreement Not to Harm Self Yes  Description of Agreement Verbal  Danger to Others  Danger to Others None reported or observed

## 2024-07-30 NOTE — Group Note (Signed)
 Date:  07/30/2024 Time:  2:03 PM  Group Topic/Focus:  Emotional Education:   The focus of this group is to discuss what feelings/emotions are, and how they are experienced.    Participation Level:  Did Not Attend  Participation Quality:  Did Not Attend  Affect:  Did Not Attend  Cognitive:  Did Not Attend  Insight: None  Engagement in Group:  Did Not Attend  Modes of Intervention:  Did Not Attend  Additional Comments:  Did Not Attend  Melinda Jimenez A Melinda Jimenez 07/30/2024, 2:03 PM

## 2024-07-30 NOTE — Group Note (Unsigned)
 Date:  07/30/2024 Time:  1:57 PM  Group Topic/Focus:  Emotional Education:   The focus of this group is to discuss what feelings/emotions are, and how they are experienced.     Participation Level:  {BHH PARTICIPATION OZCZO:77735}  Participation Quality:  {BHH PARTICIPATION QUALITY:22265}  Affect:  {BHH AFFECT:22266}  Cognitive:  {BHH COGNITIVE:22267}  Insight: {BHH Insight2:20797}  Engagement in Group:  {BHH ENGAGEMENT IN HMNLE:77731}  Modes of Intervention:  {BHH MODES OF INTERVENTION:22269}  Additional Comments:  ***  Keaun Schnabel A Taquila Leys 07/30/2024, 1:57 PM

## 2024-07-30 NOTE — Group Note (Unsigned)
 Date:  07/30/2024 Time:  10:12 AM  Group Topic/Focus:  Goals Group:   The focus of this group is to help patients establish daily goals to achieve during treatment and discuss how the patient can incorporate goal setting into their daily lives to aide in recovery.     Participation Level:  {BHH PARTICIPATION OZCZO:77735}  Participation Quality:  {BHH PARTICIPATION QUALITY:22265}  Affect:  {BHH AFFECT:22266}  Cognitive:  {BHH COGNITIVE:22267}  Insight: {BHH Insight2:20797}  Engagement in Group:  {BHH ENGAGEMENT IN HMNLE:77731}  Modes of Intervention:  {BHH MODES OF INTERVENTION:22269}  Additional Comments:  ***  Kinnie Kaupp A Ikram Riebe 07/30/2024, 10:12 AM

## 2024-07-30 NOTE — Plan of Care (Signed)
   Problem: Education: Goal: Emotional status will improve Outcome: Progressing Goal: Mental status will improve Outcome: Progressing Goal: Verbalization of understanding the information provided will improve Outcome: Progressing

## 2024-07-30 NOTE — Progress Notes (Signed)
 Patient signed 72 hour request for discharge 07/30/24 at 0830.

## 2024-07-30 NOTE — Progress Notes (Signed)
   07/30/24 0900  Psych Admission Type (Psych Patients Only)  Admission Status Voluntary  Psychosocial Assessment  Patient Complaints Irritability  Eye Contact Brief;Glaring  Facial Expression Flat  Affect Labile;Irritable  Speech Logical/coherent  Interaction Guarded  Motor Activity Other (Comment) (WNL)  Appearance/Hygiene Unremarkable  Behavior Characteristics Resistant to care;Irritable  Mood Labile;Irritable  Thought Process  Coherency WDL  Content Paranoia  Delusions None reported or observed  Perception WDL  Hallucination None reported or observed  Judgment Impaired  Confusion None  Danger to Self  Current suicidal ideation? Denies  Agreement Not to Harm Self Yes  Description of Agreement verbal  Danger to Others  Danger to Others None reported or observed

## 2024-07-30 NOTE — Progress Notes (Signed)

## 2024-07-30 NOTE — Progress Notes (Signed)
(  Sleep Hours) - 3.25 (Any PRNs that were needed, meds refused, or side effects to meds)- No PRN meds given, no meds refused.  (Any disturbances and when (visitation, over night)- None  (Concerns raised by the patient)- None  (SI/HI/AVH)- Denies SI/HI/AVH

## 2024-07-31 DIAGNOSIS — F329 Major depressive disorder, single episode, unspecified: Secondary | ICD-10-CM

## 2024-07-31 MED ORDER — HYDROXYZINE HCL 25 MG PO TABS: 25.0000 mg | ORAL_TABLET | Freq: Three times a day (TID) | ORAL | 0 refills | Status: DC | PRN

## 2024-07-31 MED ORDER — ARIPIPRAZOLE 5 MG PO TABS: 5.0000 mg | ORAL_TABLET | Freq: Every day | ORAL | 0 refills | Status: DC

## 2024-07-31 MED ORDER — TRAZODONE HCL 50 MG PO TABS: 50.0000 mg | ORAL_TABLET | Freq: Every evening | ORAL | 0 refills | Status: DC | PRN

## 2024-07-31 NOTE — Progress Notes (Signed)
  [facility] Adult Case Management Discharge Plan :  Will you be returning to the same living situation after discharge:  Yes,  returning to cousins home at discharge At discharge, do you have transportation home?: Yes,  CSW arranged [employer] to [employer] in [place] Do you have the ability to pay for your medications: Yes,  pt has active health insurance coverage  Release of information consent forms completed and in the chart;  Patient's signature needed at discharge.  **Pt refused follow up appointment for medication management, stating she will not take meds once discharged**  Patient to Follow up at:  Follow-up Information     [place] Go on 08/06/2024.   Specialty: Professional Counselor Why: Please go to this provider on 08/06/24 at 9:00 am for an assessment, to receive an appointment for therapy services. You may also go on Monday through Friday, from 9 am to 1 pm. Contact information: [address] [place]  [address] [place] [address] [phone]                 Next level of care provider has access to [facility] Link:no  Safety Planning and Suicide Prevention discussed: Yes,  completed with patient at discharge. Attempted 2x morning of discharge to reach [place] (cousin) with no response, pt stating she may not be on wifi to answer. Pt has messages from cousin stating for her to return there at discharge (RN Powell witnessed these as well). Pt has a key to get back in the home. Pt denies access to weapons/firearms.    Has patient been referred to the Quitline?: Patient does not use tobacco/nicotine products  Patient has been referred for addiction treatment: No known substance use disorder.  Jenkins LULLA Primer, LCSWA 07/31/2024, 9:34 AM

## 2024-07-31 NOTE — Progress Notes (Signed)
(  Sleep Hours) - 5 (Any PRNs that were needed, meds refused, or side effects to meds)- No PRN meds given, no meds refused.  (Any disturbances and when (visitation, over night)- None  (Concerns raised by the patient)- None  (SI/HI/AVH)- Denies SI/HI/AVH

## 2024-07-31 NOTE — BHH Suicide Risk Assessment (Signed)
 Suicide Risk Assessment  Discharge Assessment    [facility] Discharge Suicide Risk Assessment   Principal Problem: Major depressive disorder Discharge Diagnoses: Principal Problem:   Major depressive disorder  Reason for admission: Melinda Jimenez is a 38 year old African-American female with prior psychiatric history significant for major depressive disorder, generalized anxiety disorder, bipolar 1 disorder depressed severe, PTSD, borderline personality disorder, and PMHx of hypertension, prediabetes, and vitamin D  deficiency.  Patient presented voluntarily to [facility] from [facility] behavioral health urgent care for worsening depression resulting in suicidal ideation in the context of psychosocial stresses including her four children being under the custody of social services.  UDS negative for any substances.  BAL: Less than 15 obtained 03/09/2024.   Total Time spent with patient: 45 minutes  Musculoskeletal: Strength & Muscle Tone: within normal limits Gait & Station: normal Patient leans: N/A  Psychiatric Specialty Exam  Presentation  General Appearance:  Appropriate for Environment; Casual  Eye Contact: Good  Speech: Clear and Coherent  Speech Volume: Normal  Handedness: Right  Mood and Affect  Mood: Anxious; Depressed  Duration of Depression Symptoms: Greater than two weeks  Affect: Appropriate  Thought Process  Thought Processes: Coherent  Descriptions of Associations:Intact  Orientation:Full (Time, Place and Person)  Thought Content:WDL  History of Schizophrenia/Schizoaffective disorder:No  Duration of Psychotic Symptoms:Less than six months  Hallucinations:Hallucinations: None  Ideas of Reference:None  Suicidal Thoughts:Suicidal Thoughts: No SI Active Intent and/or Plan: -- (Denies) SI Passive Intent and/or Plan: -- (denies)  Homicidal Thoughts:Homicidal Thoughts: No  Sensorium  Memory: Immediate Good; Recent  Good  Judgment: Poor  Insight: Poor  Executive Functions  Concentration: Fair  Attention Span: Fair  Recall: Poor  Fund of Knowledge: Fair  Language: Fair  Psychomotor Activity  Psychomotor Activity: Psychomotor Activity: Normal  Assets  Assets: Communication Skills; Physical Health; Resilience  Sleep  Sleep: Sleep: Poor  Estimated Sleeping Duration (Last 24 Hours): 4.25-4.75 hours  Physical Exam: Physical Exam Vitals and nursing note reviewed.  Constitutional:      General: She is not in acute distress.    Appearance: She is not ill-appearing.  HENT:     Head: Normocephalic.     Right Ear: External ear normal.     Nose: Nose normal.     Mouth/Throat:     Mouth: Mucous membranes are moist.     Pharynx: Oropharynx is clear.  Eyes:     Extraocular Movements: Extraocular movements intact.  Cardiovascular:     Rate and Rhythm: Normal rate.     Pulses: Normal pulses.  Pulmonary:     Effort: Pulmonary effort is normal. No respiratory distress.  Abdominal:     Comments: Deferred  Genitourinary:    Comments: Deferred Musculoskeletal:        General: Normal range of motion.  Skin:    General: Skin is dry.  Neurological:     General: No focal deficit present.     Mental Status: She is alert and oriented to person, place, and time.  Psychiatric:        Mood and Affect: Mood normal.    Review of Systems  Constitutional:  Negative for chills and fever.  HENT:  Negative for sore throat.   Eyes:  Negative for blurred vision.  Respiratory:  Negative for cough, sputum production, shortness of breath and wheezing.   Cardiovascular:  Negative for chest pain and palpitations.  Gastrointestinal:  Negative for abdominal pain, constipation, diarrhea, heartburn, nausea and vomiting.  Genitourinary:  Negative for dysuria.  Musculoskeletal:  Negative for falls.  Skin:  Negative for itching and rash.  Neurological:  Negative for dizziness and headaches.   Endo/Heme/Allergies:        See allergy listing  Psychiatric/Behavioral:  Negative for depression, hallucinations, substance abuse and suicidal ideas. The patient is nervous/anxious. The patient does not have insomnia.    Blood pressure (!) 135/95, pulse 86, temperature 98.1 F (36.7 C), temperature source Oral, resp. rate 18, height 5' 7 (1.702 m), weight 99.3 kg, SpO2 100%. Body mass index is 34.3 kg/m.  Mental Status Per Nursing Assessment::   On Admission:  Suicidal ideation indicated by patient  Demographic Factors:  Low socioeconomic status and Unemployed  Loss Factors: Financial problems/change in socioeconomic status  Historical Factors: Family history of mental illness or substance abuse and Impulsivity  Risk Reduction Factors:   Responsible for children under 45 years of age, Religious beliefs about death, Living with another person, especially a relative, Positive social support, Positive therapeutic relationship, and Positive coping skills or problem solving skills  Continued Clinical Symptoms:  Bipolar Disorder:   Mixed State Depression:   Recent sense of peace/wellbeing More than one psychiatric diagnosis Previous Psychiatric Diagnoses and Treatments Medical Diagnoses and Treatments/Surgeries  Cognitive Features That Contribute To Risk:  Polarized thinking    Suicide Risk:  Mild:  There are no identifiable plans, no associated intent, mild dysphoria and related symptoms, good self-control (both objective and subjective assessment), few other risk factors, and identifiable protective factors, including available and accessible social support.   Follow-up Information     [place] Go on 08/06/2024.   Specialty: Professional Counselor Why: Please go to this provider on 08/06/24 at 9:00 am for an assessment, to receive an appointment for therapy services. You may also go on Monday through Friday, from 9 am to 1 pm. Contact information: [address]  [place]  [address] [place] [address] [phone]                Plan Of Care/Follow-up recommendations:  Discharge Recommendations:    The patient is being discharged with her family.   Patient is to take her discharge medications as ordered. ?See follow up above.   We recommend that she participates in individual therapy to target uncontrollable agitation and substance abuse.    We recommend that she participates in therapy to target the conflict with her family, to improve communication skills and conflict resolution skills. ?patient is to initiate/implement a contingency based behavioral model to address patient's behavior.   We recommend that she gets AIMS scale, height, weight, blood pressure, fasting lipid panel, fasting blood sugar in three months from discharge if she's on atypical antipsychotics.    Patient will benefit from monitoring of recurrent suicidal ideation since patient is on antidepressant medication.   The patient should abstain from all illicit substances and alcohol.   If the patient's symptoms worsen or do not continue to improve or if the patient becomes actively suicidal or homicidal then it is recommended that the patient return to the closest hospital emergency room or call 911 for further evaluation and treatment. National Suicide Prevention Lifeline 1800-SUICIDE or [phone])116-6524.   Please follow up with your primary medical doctor for all other medical needs.    The patient has been educated on the possible side effects to medications and she/her guardian is to contact a medical professional and inform outpatient provider of any new side effects of medication.   She is to  take regular diet and activity as tolerated. ?Will benefit from moderate daily exercise.   Patient was educated about removing/locking any firearms, medications or dangerous products from the home.   Activity:  As tolerated   Diet:  Regular Diet   Ellouise JAYSON Azure,  FNP 07/31/2024, 10:30 AM

## 2024-07-31 NOTE — Discharge Summary (Addendum)
 Physician Discharge Summary Note  Patient:  Melinda Jimenez is an 38 y.o., female MRN:  [id number] DOB:  Feb 25, 1986 Patient phone:  [phone] (home)  Patient address:   [place] [address],   Total Time spent with patient: 45 minutes  Date of Admission:  07/28/2024 Date of Discharge:   07/31/2024  Reason for Admission:  ABRI VACCA is a 38 year old African-American female with prior psychiatric history significant for major depressive disorder, generalized anxiety disorder, bipolar 1 disorder depressed severe, PTSD, borderline personality disorder, and PMHx of hypertension, prediabetes, and vitamin D  deficiency.  Patient presented voluntarily to [facility] from [facility] behavioral health urgent care for worsening depression resulting in suicidal ideation in the context of psychosocial stresses including her four children being under the custody of social services.  UDS negative for any substances.  BAL: Less than 15 obtained 03/09/2024.   Principal Problem: Major depressive disorder Discharge Diagnoses: Principal Problem:   Major depressive disorder  Past Psychiatric History: Previous Psych Diagnoses: MDD, bipolar disorder, and generalized anxiety. Prior inpatient treatment: X 2, most recently at [facility] in June 2025 Current/prior outpatient treatment: Denies Prior rehab hx: Denies Psychotherapy hx: Yes History of suicide: Denies History of homicide or aggression: Denies Psychiatric medication history: Yes, Abilify  trazodone  and hydroxyzine  Psychiatric medication compliance history: Noncompliance Neuromodulation history: denies Current Psychiatrist: Denies Current therapist: Denies   Past Medical History:  Past Medical History:  Diagnosis Date   Anemia    Anxiety    off meds with preg   Bipolar 1 disorder (HCC) 03/10/2024   Borderline personality disorder (HCC) 03/10/2024   Brief psychotic disorder (HCC) 04/03/2020   Carpal tunnel  syndrome    Cesarean delivery delivered 10/10/2020   Chlamydia    Chronic hypertension    Chronic post-traumatic stress disorder (PTSD) 03/10/2024   COVID-19 09/12/2020   Genital HSV    Last OB: > 36yr ago; Valtrex  500 mg po prn outbreaks   GERD (gastroesophageal reflux disease)    GERD without esophagitis 01/20/2013   Gestational diabetes    diet controlled   Goiter, unspecified 04/18/2010   Centricity Description: THYROMEGALY  Qualifier: Diagnosis of   By: Lelon RIGGERS, Scott      Centricity Description: GOITER  Qualifier: Diagnosis of   By: Lelon RIGGERS, Scott      Replacing diagnoses that were inactivated after the 12/31/22 regulatory import     H/O: cesarean section 09/27/2016   last C-section for active HSV, C-section X1: 05/20/13; records in [facility]  TOLAC desired, signed 09/27/16      Headache(784.0)    Herpes genitalis in women 04/18/2010   Qualifier: Diagnosis of   By: Lelon RIGGERS, Scott         IBS (irritable bowel syndrome)    Infection    UTI   Intrauterine pregnancy 03/22/2020   EDC 11/14/2020     Panic attack    Premature rupture of membranes (PROM), onset of labor after 24 hours, antepartum, preterm, third trimester 10/10/2020   Pulmonary embolus (HCC) 10/18/2020   Thyroid  disease    Trichomonas    Vitamin D  deficiency 03/10/2024    Past Surgical History:  Procedure Laterality Date   CESAREAN SECTION N/A 05/20/2013   Procedure: CESAREAN SECTION;  Surgeon: Carlin DELENA Centers, MD;  Location: [facility] ORS;  Service: Obstetrics;  Laterality: N/A;   CESAREAN SECTION N/A 04/06/2017   Procedure: CESAREAN SECTION;  Surgeon: Lorence Ozell CROME, MD;  Location: [facility];  Service: Obstetrics;  Laterality: N/A;   CESAREAN SECTION  10/10/2020   Procedure: CESAREAN SECTION;  Surgeon: Zina Jerilynn LABOR, MD;  Location: [facility] LD ORS;  Service: Obstetrics;;   INDUCED ABORTION  2009   VAGINAL DELIVERY  2007   Family History:  Family History  Problem Relation Age of Onset   Depression  Mother    Anxiety disorder Mother    Breast cancer Other    Bipolar disorder Brother    Diabetes Maternal Grandmother    Cancer Maternal Grandmother    Hypertension Maternal Grandmother    Hypertension Paternal Grandmother    Autism Other    Schizophrenia Other    Family Psychiatric  History: See H&P Social History:  Social History   Substance and Sexual Activity  Alcohol Use No   Comment: Twice a month     Social History   Substance and Sexual Activity  Drug Use No    Social History   Socioeconomic History   Marital status: Single    Spouse name: Not on file   Number of children: 1   Years of education: Not on file   Highest education level: Not on file  Occupational History   Occupation: Unemployed  Tobacco Use   Smoking status: Passive Smoke Exposure - Never Smoker   Smokeless tobacco: Never   Tobacco comments:    was not a daily smoker, 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  Advertising Account Planner   Vaping status: Never Used  Substance and Sexual Activity   Alcohol use: No    Comment: Twice a month   Drug use: No   Sexual activity: Yes    Birth control/protection: None    Comment: Patient is interested in implant  Other Topics Concern   Not on file  Social History Narrative   Not on file   Social Drivers of Health   Financial Resource Strain: Not on file  Food Insecurity: Food Insecurity Present (07/28/2024)   Hunger Vital Sign    Worried About Running Out of Food in the Last Year: Sometimes true    Ran Out of Food in the Last Year: Sometimes true  Transportation Needs: No Transportation Needs (07/28/2024)   PRAPARE - Administrator, Civil Service (Medical): No    Lack of Transportation (Non-Medical): No  Physical Activity: Not on file  Stress: Not on file  Social Connections: Unknown (02/05/2022)   Received from [facility]   Social Network    Social Network: Not on file   [facility] course: During the patient's hospitalization, patient had extensive initial psychiatric  evaluation, and follow-up psychiatric evaluations every day.  Psychiatric diagnoses provided upon initial assessment:  Diagnosis:  Principal Problem: Major depressive disorder Bipolar 1 disorder severe Generalized anxiety disorder Borderline personality disorder PTSD  Patient's psychiatric medications were adjusted on admission:  --Initiate Prozac  10 mg tablet p.o. daily bedtime for depression --Initiate Abilify  5 mg p.o. daily at bedtime for depression and paranoia --Continue hydroxyzine  25 mg tablet p.o. 3 times daily as needed for anxiety -- Continue trazodone  tablet 50 mg p.o. q. nightly as needed for insomnia    Medications for other medical conditions: Resume home medication -- Nifedipine  30 mg  24-hour tablets p.o. daily for high blood pressure  During the hospitalization, other adjustments were made to the patient's psychiatric medication regimen:  Patient refused psychotropic medications since admission.  Stated she needed Jesus rather than medications.  Prozac  discontinued due to the patient's noncompliance and mood activation. Patient further refused her blood pressure medication of nifedipine   and states it causes her blood pressure to be increased.    Patient's care was discussed during the interdisciplinary team meeting every day during the hospitalization.  The patient denies having side effects to prescribed psychiatric medication.  Gradually, patient started adjusting to milieu. The patient was evaluated each day by a clinical provider to ascertain response to treatment. Improvement was noted by the patient's report of decreasing symptoms, improved sleep and appetite, affect, medication tolerance, behavior, and participation in unit programming.  Patient was asked each day to complete a self inventory noting mood, mental status, pain, new symptoms, anxiety and concerns.    Symptoms were reported as significantly decreased or resolved completely by discharge.   On day  of discharge, the patient reports that their mood is stable. The patient denied having suicidal thoughts for more than 48 hours prior to discharge.  Patient denies having homicidal thoughts.  Patient denies having auditory hallucinations.  Patient denies any visual hallucinations or other symptoms of psychosis. The patient was motivated to continue taking medication with a goal of continued improvement in mental health.   The patient reports their target psychiatric symptoms of depression responded well to the psychiatric medications, and the patient reports overall benefit other psychiatric hospitalization. Supportive psychotherapy was provided to the patient. The patient also participated in regular group therapy while hospitalized. Coping skills, problem solving as well as relaxation therapies were also part of the unit programming.  Labs were reviewed with the patient, and abnormal results were discussed with the patient.  The patient is able to verbalize their individual safety plan to this provider.  # It is recommended to the patient to continue psychiatric medications as prescribed, after discharge from the hospital.    # It is recommended to the patient to follow up with your outpatient psychiatric provider and PCP.  # It was discussed with the patient, the impact of alcohol, drugs, tobacco have been there overall psychiatric and medical wellbeing, and total abstinence from substance use was recommended the patient.ed.  # Prescriptions provided or sent directly to preferred pharmacy at discharge. Patient agreeable to plan. Given opportunity to ask questions. Appears to feel comfortable with discharge.    # In the event of worsening symptoms, the patient is instructed to call the crisis hotline, 911 and or go to the nearest ED for appropriate evaluation and treatment of symptoms. To follow-up with primary care provider for other medical issues, concerns and or health care needs  # Patient  was discharged to home with cousin with a plan to follow up as noted below.   Physical Findings: AIMS:  , ,  ,  ,  ,  ,   CIWA:    COWS:     Musculoskeletal: Strength & Muscle Tone: within normal limits Gait & Station: normal Patient leans: N/A  Psychiatric Specialty Exam:  Presentation  General Appearance:  Casual  Eye Contact: Good  Speech: Clear and Coherent  Speech Volume: Normal  Handedness: Right  Mood and Affect  Mood: Anxious; Depressed  Affect: Congruent  Thought Process  Thought Processes: Coherent  Descriptions of Associations:Intact  Orientation:Full (Time, Place and Person)  Thought Content:WDL  History of Schizophrenia/Schizoaffective disorder:No  Duration of Psychotic Symptoms:Less than six months  Hallucinations:Hallucinations: None  Ideas of Reference:None  Suicidal Thoughts:Suicidal Thoughts: No SI Active Intent and/or Plan: -- (Denies) SI Passive Intent and/or Plan: -- (Denies)  Homicidal Thoughts:Homicidal Thoughts: No  Sensorium  Memory: Immediate Good; Recent Good  Judgment: Fair  Insight: Fair  Executive Functions  Concentration: Fair  Attention Span: Fair  Recall: [employer] of Knowledge: Fair  Language: Fair  Psychomotor Activity  Psychomotor Activity: Psychomotor Activity: Normal  Assets  Assets: Manufacturing Systems Engineer; Physical Health  Sleep  Sleep: Sleep: Poor  Estimated Sleeping Duration (Last 24 Hours): 4.25-4.75 hours  Physical Exam: Physical Exam Vitals and nursing note reviewed.  Constitutional:      General: She is not in acute distress.    Appearance: She is normal weight. She is not ill-appearing.  HENT:     Head: Normocephalic.     Right Ear: External ear normal.     Left Ear: External ear normal.     Nose: Nose normal.     Mouth/Throat:     Mouth: Mucous membranes are moist.  Eyes:     Extraocular Movements: Extraocular movements intact.  Cardiovascular:     Rate  and Rhythm: Normal rate.  Pulmonary:     Effort: Pulmonary effort is normal. No respiratory distress.  Abdominal:     Comments: Deferred  Genitourinary:    Comments: Deferred Musculoskeletal:        General: Normal range of motion.     Cervical back: Normal range of motion.  Skin:    General: Skin is dry.  Neurological:     General: No focal deficit present.     Mental Status: She is alert and oriented to person, place, and time.  Psychiatric:        Mood and Affect: Mood normal.    Review of Systems  Constitutional:  Negative for chills and fever.  HENT:  Negative for sore throat.   Eyes:  Negative for blurred vision.  Respiratory:  Negative for cough, sputum production, shortness of breath and wheezing.   Cardiovascular:  Negative for chest pain and palpitations.  Gastrointestinal:  Negative for abdominal pain, constipation, diarrhea, heartburn, nausea and vomiting.  Genitourinary:  Negative for dysuria.  Musculoskeletal:  Negative for falls.  Skin:  Negative for itching and rash.  Neurological:  Negative for dizziness and headaches.  Endo/Heme/Allergies:        See allergy listing  Psychiatric/Behavioral:  Negative for depression, hallucinations, substance abuse and suicidal ideas. The patient is nervous/anxious (Improved with medication). The patient does not have insomnia.    Blood pressure (!) 135/95, pulse 86, temperature 98.1 F (36.7 C), temperature source Oral, resp. rate 18, height 5' 7 (1.702 m), weight 99.3 kg, SpO2 100%. Body mass index is 34.3 kg/m.  Social History   Tobacco Use  Smoking Status Passive Smoke Exposure - Never Smoker  Smokeless Tobacco Never  Tobacco Comments   was not a daily smoker, tried it   Tobacco Cessation:  N/A, patient does not currently use tobacco products  Blood Alcohol level:  Lab Results  Component Value Date   [facility] <15 03/09/2024   ETH <10 11/26/2022    Metabolic Disorder Labs:  Lab Results  Component Value Date    HGBA1C 5.9 (H) 07/28/2024   MPG 122.63 07/28/2024   MPG 134.11 03/09/2024   No results found for: PROLACTIN Lab Results  Component Value Date   CHOL 116 07/29/2024   TRIG 56 07/29/2024   HDL 33 (L) 07/29/2024   CHOLHDL 3.5 07/29/2024   VLDL 11 07/29/2024   LDLCALC 71 07/29/2024   LDLCALC 93 03/09/2024   See Psychiatric Specialty Exam and Suicide Risk Assessment completed by Attending Physician prior to discharge.  Discharge destination:  Home  Is patient on multiple antipsychotic therapies  at discharge:  No   Has Patient had three or more failed trials of antipsychotic monotherapy by history:  No  Recommended Plan for Multiple Antipsychotic Therapies: NA  Discharge Instructions     Increase activity slowly   Complete by: As directed       Allergies as of 07/31/2024       Reactions   Latex Itching   Zithromax  [azithromycin ] Hives, Nausea And Vomiting   Zofran  [ondansetron ] Nausea Only        Medication List     STOP taking these medications    ibuprofen  200 MG tablet Commonly known as: ADVIL    NIFEdipine  30 MG 24 hr tablet Commonly known as: PROCARDIA -XL/NIFEDICAL-XL       TAKE these medications      Indication  ARIPiprazole  5 MG tablet Commonly known as: ABILIFY  Take 1 tablet (5 mg total) by mouth daily. Start taking on: August 01, 2024 What changed:  medication strength how much to take when to take this  Indication: Major Depressive Disorder   hydrOXYzine  25 MG tablet Commonly known as: ATARAX  Take 1 tablet (25 mg total) by mouth 3 (three) times daily as needed for anxiety.  Indication: Feeling Anxious   traZODone  50 MG tablet Commonly known as: DESYREL  Take 1 tablet (50 mg total) by mouth at bedtime as needed for sleep.  Indication: Trouble Sleeping        Follow-up Information     [facility] Go on 08/06/2024.   Specialty: Professional Counselor Why: Please go to this provider on 08/06/24 at 9:00 am for an  assessment, to receive an appointment for therapy services. You may also go on Monday through Friday, from 9 am to 1 pm. Contact information: [address]  [address] [place] [address] [phone]                Discharge Recommendations:    The patient is being discharged with her family.   Patient is to take her discharge medications as ordered. ?See follow up above.   We recommend that she participates in individual therapy to target uncontrollable agitation and substance abuse.    We recommend that she participates in therapy to target the conflict with her family, to improve communication skills and conflict resolution skills. ?patient is to initiate/implement a contingency based behavioral model to address patient's behavior.   We recommend that she gets AIMS scale, height, weight, blood pressure, fasting lipid panel, fasting blood sugar in three months from discharge if she's on atypical antipsychotics.    Patient will benefit from monitoring of recurrent suicidal ideation since patient is on antidepressant medication.   The patient should abstain from all illicit substances and alcohol.   If the patient's symptoms worsen or do not continue to improve or if the patient becomes actively suicidal or homicidal then it is recommended that the patient return to the closest hospital emergency room or call 911 for further evaluation and treatment. National Suicide Prevention Lifeline 1800-SUICIDE or 414-137-8169.   Please follow up with your primary medical doctor for all other medical needs.    The patient has been educated on the possible side effects to medications and she/her guardian is to contact a medical professional and inform outpatient provider of any new side effects of medication.   She is to take regular diet and activity as tolerated. ?Will benefit from moderate daily exercise.   Patient was educated about removing/locking any firearms, medications or  dangerous products from the home.  Activity:  As tolerated   Diet:  Regular Diet   Signed:  Ellouise JAYSON Azure, FNP 07/31/2024, 10:45 AM

## 2024-07-31 NOTE — [facility] Suicide Risk Assessment (Signed)
 [facility] INPATIENT:  Family/Significant Other Suicide Prevention Education  Suicide Prevention Education:   Suicide Prevention Education was reviewed thoroughly with patient, including risk factors, warning signs, and what to do. Mobile Crisis services were described and that telephone number pointed out, with encouragement to patient to put this number in personal cell phone. Brochure was provided to patient to share with natural supports. Patient acknowledged the ways in which they are at risk, and how working through each of their issues can gradually start to reduce their risk factors. Patient was encouraged to think of the information in the context of people in their own lives. Patient denied having access to firearms Patient verbalized understanding of information provided. Patient endorsed a desire to live.

## 2024-07-31 NOTE — Progress Notes (Signed)
 Patient verbalizes readiness for discharge. All patient belongings returned to patient. Discharge instructions read and discussed with patient (appointments, medications, resources). Patient discharged to lobby at 1040 where taxi driver was waiting.

## 2024-08-12 ENCOUNTER — Ambulatory Visit (HOSPITAL_COMMUNITY)
Admission: EM | Admit: 2024-08-12 | Discharge: 2024-08-13 | Disposition: A | Discharge status: Other Acute Inpatient Hospital | Payer: MEDICAID

## 2024-08-12 DIAGNOSIS — F23 Brief psychotic disorder: Secondary | ICD-10-CM | POA: Insufficient documentation

## 2024-08-12 DIAGNOSIS — Z599 Problem related to housing and economic circumstances, unspecified: Secondary | ICD-10-CM | POA: Insufficient documentation

## 2024-08-12 DIAGNOSIS — F332 Major depressive disorder, recurrent severe without psychotic features: Secondary | ICD-10-CM | POA: Insufficient documentation

## 2024-08-12 DIAGNOSIS — R45851 Suicidal ideations: Secondary | ICD-10-CM | POA: Insufficient documentation

## 2024-08-12 DIAGNOSIS — F603 Borderline personality disorder: Secondary | ICD-10-CM | POA: Insufficient documentation

## 2024-08-12 DIAGNOSIS — Z5982 Transportation insecurity: Secondary | ICD-10-CM | POA: Insufficient documentation

## 2024-08-12 DIAGNOSIS — F419 Anxiety disorder, unspecified: Secondary | ICD-10-CM | POA: Insufficient documentation

## 2024-08-12 DIAGNOSIS — Z79899 Other long term (current) drug therapy: Secondary | ICD-10-CM | POA: Insufficient documentation

## 2024-08-12 DIAGNOSIS — Z91148 Patient's other noncompliance with medication regimen for other reason: Secondary | ICD-10-CM | POA: Insufficient documentation

## 2024-08-12 LAB — CBC WITH DIFFERENTIAL/PLATELET
Abs Immature Granulocytes: 0.03 K/uL (ref 0.00–0.07)
Basophils Absolute: 0 K/uL (ref 0.0–0.1)
Basophils Relative: 0 %
Eosinophils Absolute: 0.1 K/uL (ref 0.0–0.5)
Eosinophils Relative: 1 %
HCT: 35.8 % — ABNORMAL LOW (ref 36.0–46.0)
Hemoglobin: 10.6 g/dL — ABNORMAL LOW (ref 12.0–15.0)
Immature Granulocytes: 0 %
Lymphocytes Relative: 42 %
Lymphs Abs: 5.2 K/uL — ABNORMAL HIGH (ref 0.7–4.0)
MCH: 20.2 pg — ABNORMAL LOW (ref 26.0–34.0)
MCHC: 29.6 g/dL — ABNORMAL LOW (ref 30.0–36.0)
MCV: 68.2 fL — ABNORMAL LOW (ref 80.0–100.0)
Monocytes Absolute: 0.5 K/uL (ref 0.1–1.0)
Monocytes Relative: 4 %
Neutro Abs: 6.3 K/uL (ref 1.7–7.7)
Neutrophils Relative %: 53 %
Platelets: 405 K/uL — ABNORMAL HIGH (ref 150–400)
RBC: 5.25 MIL/uL — ABNORMAL HIGH (ref 3.87–5.11)
RDW: 20.2 % — ABNORMAL HIGH (ref 11.5–15.5)
Smear Review: NORMAL
WBC: 12.2 K/uL — ABNORMAL HIGH (ref 4.0–10.5)
nRBC: 0 % (ref 0.0–0.2)

## 2024-08-12 LAB — LIPID PANEL
Cholesterol: 126 mg/dL (ref 0–200)
HDL: 35 mg/dL — ABNORMAL LOW (ref >40)
LDL Cholesterol: 72 mg/dL (ref 0–99)
Total CHOL/HDL Ratio: 3.6 ratio
Triglycerides: 93 mg/dL (ref <150)
VLDL: 19 mg/dL (ref 0–40)

## 2024-08-12 LAB — COMPREHENSIVE METABOLIC PANEL WITH GFR
ALT: 15 U/L (ref 0–44)
AST: 16 U/L (ref 15–41)
Albumin: 3.9 g/dL (ref 3.5–5.0)
Alkaline Phosphatase: 63 U/L (ref 38–126)
Anion gap: 12 (ref 5–15)
BUN: 14 mg/dL (ref 6–20)
CO2: 24 mmol/L (ref 22–32)
Calcium: 9.4 mg/dL (ref 8.9–10.3)
Chloride: 102 mmol/L (ref 98–111)
Creatinine, Ser: 0.67 mg/dL (ref 0.44–1.00)
GFR, Estimated: >60 mL/min (ref >60)
Glucose, Bld: 108 mg/dL — ABNORMAL HIGH (ref 70–99)
Potassium: 3.6 mmol/L (ref 3.5–5.1)
Sodium: 138 mmol/L (ref 135–145)
Total Bilirubin: 0.6 mg/dL (ref 0.0–1.2)
Total Protein: 8.9 g/dL — ABNORMAL HIGH (ref 6.5–8.1)

## 2024-08-12 LAB — POCT URINE DRUG SCREEN - MANUAL ENTRY (I-SCREEN)
POC Amphetamine UR: NOT DETECTED
POC Buprenorphine (BUP): NOT DETECTED
POC Cocaine UR: NOT DETECTED
POC Marijuana UR: NOT DETECTED
POC Methadone UR: NOT DETECTED
POC Methamphetamine UR: NOT DETECTED
POC Morphine: NOT DETECTED
POC Oxazepam (BZO): NOT DETECTED
POC Oxycodone UR: NOT DETECTED
POC Secobarbital (BAR): NOT DETECTED

## 2024-08-12 LAB — HEPATITIS PANEL, ACUTE
HCV Ab: NONREACTIVE
Hep A IgM: NONREACTIVE
Hep B C IgM: NONREACTIVE
Hepatitis B Surface Ag: NONREACTIVE

## 2024-08-12 LAB — POC URINE PREG, ED: Preg Test, Ur: NEGATIVE

## 2024-08-12 LAB — HEMOGLOBIN A1C
Hgb A1c MFr Bld: 6 % — ABNORMAL HIGH (ref 4.8–5.6)
Mean Plasma Glucose: 125.5 mg/dL

## 2024-08-12 LAB — TSH: TSH: 0.595 u[IU]/mL (ref 0.350–4.500)

## 2024-08-12 LAB — MAGNESIUM: Magnesium: 2 mg/dL (ref 1.7–2.4)

## 2024-08-12 MED ORDER — DIPHENHYDRAMINE HCL 50 MG/ML IJ SOLN: 50.0000 mg | Freq: Three times a day (TID) | INTRAMUSCULAR | Status: DC | PRN

## 2024-08-12 MED ORDER — HALOPERIDOL LACTATE 5 MG/ML IJ SOLN: 10.0000 mg | Freq: Three times a day (TID) | INTRAMUSCULAR | Status: DC | PRN

## 2024-08-12 MED ORDER — ACETAMINOPHEN 325 MG PO TABS: 650.0000 mg | ORAL_TABLET | Freq: Four times a day (QID) | ORAL | Status: DC | PRN

## 2024-08-12 MED ORDER — LORAZEPAM 2 MG/ML IJ SOLN: 2.0000 mg | Freq: Three times a day (TID) | INTRAMUSCULAR | Status: DC | PRN

## 2024-08-12 MED ORDER — HALOPERIDOL 5 MG PO TABS
5.0000 mg | ORAL_TABLET | Freq: Three times a day (TID) | ORAL | Status: DC | PRN
Start: 2024-08-12 — End: 2024-08-13

## 2024-08-12 MED ORDER — DIPHENHYDRAMINE HCL 50 MG PO CAPS
50.0000 mg | ORAL_CAPSULE | Freq: Three times a day (TID) | ORAL | Status: DC | PRN
Start: 2024-08-12 — End: 2024-08-13

## 2024-08-12 MED ORDER — MAGNESIUM HYDROXIDE 400 MG/5ML PO SUSP: 30.0000 mL | Freq: Every day | ORAL | Status: DC | PRN

## 2024-08-12 MED ORDER — HALOPERIDOL LACTATE 5 MG/ML IJ SOLN: 5.0000 mg | Freq: Three times a day (TID) | INTRAMUSCULAR | Status: DC | PRN

## 2024-08-12 MED ORDER — HYDROXYZINE HCL 25 MG PO TABS
25.0000 mg | ORAL_TABLET | Freq: Three times a day (TID) | ORAL | Status: DC | PRN
Start: 2024-08-12 — End: 2024-08-13

## 2024-08-12 MED ORDER — TRAZODONE HCL 50 MG PO TABS: 50.0000 mg | ORAL_TABLET | Freq: Every evening | ORAL | Status: DC | PRN

## 2024-08-12 MED ORDER — ALUM & MAG HYDROXIDE-SIMETH 200-200-20 MG/5ML PO SUSP: 30.0000 mL | ORAL | Status: DC | PRN

## 2024-08-12 NOTE — ED Provider Notes (Signed)
 [facility] Continuous Assessment Admission H&P  Date: 08/12/24 Patient Name: Melinda Jimenez MRN: [id number] Chief Complaint: I am angry, frustrated, tired, just need a break  Diagnoses:  Final diagnoses:  Borderline personality disorder (HCC)   HPI: Melinda Jimenez is a 38 year old female who presented to [facility] as a voluntary walk-in, accompanied by [employer], for evaluation of suicidal ideation and depression.  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  Per chart review, patient assessed at [facility] in early 07/2024, and assessed later again that month leading to admission at [facility].  Patient was discharged from [facility] on 07/27/2024 with p.o. Abilify  and outpatient therapy appointment.  On assessment patient is guarded, with impoverished garbled speech and labile, irritable, angry, depressed and anxious mood with congruent and tearful affect.  Patient is a poor historian, with recent housing instability and medication noncompliance.  Patient initially refuses to answer questions.  After long silence, patient says she did not make any progress.  Patient says she did not make progress with medications, therapy.  Patient says she just wants like 2 or 3 nights.  Patient says she did not take medications prescribed to her since she was discharged on 07/27/2024 from [facility]  Patient unable to provide any answers to her medication noncompliance, does not recall the name of the medication.  Patient unable to make outpatient therapy appointment with [facility] family service on 08/06/2024, due to lack of transportation.  Patient says she has been living at a family member's house.  Patient does not want to give me name of family member.  Patient says this is a female cousin who she has been living with since she was evicted.  [place]  Patient denies having any children, and only answers I do not know when asked what  she did for employment.  Patient's chief complaint at time of triage was SI, however patient only says yeah I have those thoughts when directly asked about suicidal ideation.  Patient says she struggles with irritable, agitated mood and anxiety.   Patient said I was weird and asked me to write down my name, then demanded to see my badge.  At the end of interview, I thanked patient for speaking with me, and she asked if this was some tactic to piss her off and get her angry.  I reassured patient this was not my intent, however patient says she does not trust me.  Total Time spent with patient: 1 hour  Musculoskeletal  Strength & Muscle Tone: within normal limits Gait & Station: normal Patient leans: N/A  Psychiatric Specialty Exam  Presentation General Appearance:  Appropriate for Environment (golden girls shirt)  Eye Contact: Minimal  Speech: Garbled (impoverished)  Speech Volume: Normal  Handedness: Right  Mood and Affect  Mood: Irritable; Labile; Angry; Anxious; Depressed  Affect: Labile; Congruent; Tearful; Full Range  Thought Process  Thought Processes: Coherent  Descriptions of Associations:Intact  Orientation:Full (Time, Place and Person)  Thought Content:WDL; Logical  Diagnosis of Schizophrenia or Schizoaffective disorder in past: No  Duration of Psychotic Symptoms: Less than six months  Hallucinations:Hallucinations: None  Ideas of Reference:None  Suicidal Thoughts:Suicidal Thoughts: Yes, Passive SI Active Intent and/or Plan: -- (Patient refuses to elaborate ideation, says she just has thoughts about it)  Homicidal Thoughts:Homicidal Thoughts: No  Sensorium  Memory: Immediate Fair  Judgment: Poor  Insight: Lacking; Shallow   Executive Functions  Concentration: Fair  Attention Span: Good  Recall: Good  Fund of Knowledge: Fair  Language: Fair  Psychomotor Activity  Psychomotor Activity: Psychomotor Activity:  Restlessness; Other (comment) (Fidgeting with fingers)  Assets  Assets: Desire for Improvement; Resilience; Physical Health; Leisure Time  Sleep  Sleep: Sleep: Fair  No data recorded  Physical Exam Constitutional:      General: She is not in acute distress.    Appearance: She is obese. She is not ill-appearing or toxic-appearing.  HENT:     Nose: Congestion present.  Pulmonary:     Effort: Pulmonary effort is normal.  Neurological:     General: No focal deficit present.     Mental Status: She is alert.    Review of Systems  Gastrointestinal:  Positive for abdominal pain.  Musculoskeletal:  Positive for back pain, myalgias and neck pain.  Psychiatric/Behavioral:  Positive for depression and suicidal ideas. Negative for hallucinations. The patient is nervous/anxious.     Blood pressure 133/89, pulse 78, temperature 98.3 F (36.8 C), temperature source Oral, resp. rate 20, SpO2 99%. There is no height or weight on file to calculate BMI.  Past Psychiatric History:   Prior diagnoses: MDD, bipolar, bpd, brief psychotic disorder  Prior inpatient hospitalizations: 01 March 2024, 01 July 2024 Suicide attempts: Denies Medication trials: Abilify -does not like this, makes her sleepy, Xanax -agreeable, blocks out problems  Substance use history: Denies  Is the patient at risk to self? Yes  Has the patient been a risk to self in the past 6 months? Yes .    Has the patient been a risk to self within the distant past? No   Is the patient a risk to others? No   Has the patient been a risk to others in the past 6 months? No   Has the patient been a risk to others within the distant past? No   Past Medical History: Vitamin D  Deficiency, GERD, Anemia, carpal tunnel, IBS, C-section, HTN  Family History: Denies  Social History: Recently evicted early October 2025, has been living with female cousin.  Denies having children, or social support outside this family member.  Patient  employment history: I do not know  Last Labs:  Admission on 08/12/2024  Component Date Value Ref Range Status   Preg Test, Ur 08/12/2024 Negative  Negative Final   POC Amphetamine UR 08/12/2024 None Detected  NONE DETECTED (Cut Off Level 1000 ng/mL) Final   POC Secobarbital (BAR) 08/12/2024 None Detected  NONE DETECTED (Cut Off Level 300 ng/mL) Final   POC Buprenorphine (BUP) 08/12/2024 None Detected  NONE DETECTED (Cut Off Level 10 ng/mL) Final   POC Oxazepam (BZO) 08/12/2024 None Detected  NONE DETECTED (Cut Off Level 300 ng/mL) Final   POC Cocaine UR 08/12/2024 None Detected  NONE DETECTED (Cut Off Level 300 ng/mL) Final   POC Methamphetamine UR 08/12/2024 None Detected  NONE DETECTED (Cut Off Level 1000 ng/mL) Final   POC Morphine  08/12/2024 None Detected  NONE DETECTED (Cut Off Level 300 ng/mL) Final   POC Methadone UR 08/12/2024 None Detected  NONE DETECTED (Cut Off Level 300 ng/mL) Final   POC Oxycodone  UR 08/12/2024 None Detected  NONE DETECTED (Cut Off Level 100 ng/mL) Final   POC Marijuana UR 08/12/2024 None Detected  NONE DETECTED (Cut Off Level 50 ng/mL) Final  Admission on 07/28/2024, Discharged on 07/31/2024  Component Date Value Ref Range Status   Cholesterol 07/29/2024 116  0 - 200 mg/dL Final   Comment:  ATP III CLASSIFICATION:  <200     mg/dL   Desirable  799-760  mg/dL   Borderline High  >=759    mg/dL   High           Triglycerides 07/29/2024 56  <150 mg/dL Final   HDL 89/70/7974 33 (L)  >40 mg/dL Final   Total CHOL/HDL Ratio 07/29/2024 3.5  RATIO Final   VLDL 07/29/2024 11  0 - 40 mg/dL Final   LDL Cholesterol 07/29/2024 71  0 - 99 mg/dL Final   Comment:        Total Cholesterol/HDL:CHD Risk Coronary Heart Disease Risk Table                     Men   Women  1/2 Average Risk   3.4   3.3  Average Risk       5.0   4.4  2 X Average Risk   9.6   7.1  3 X Average Risk  23.4   11.0        Use the calculated Patient Ratio above and the CHD Risk Table to  determine the patient's CHD Risk.        ATP III CLASSIFICATION (LDL):  <100     mg/dL   Optimal  899-870  mg/dL   Near or Above                    Optimal  130-159  mg/dL   Borderline  839-810  mg/dL   High  >809     mg/dL   Very High Performed at [facility], [address], [place], [place] [address]    Vit D, 25-Hydroxy 07/30/2024 11.48 (L)  30 - 100 ng/mL Final   Comment: (NOTE) Vitamin D  deficiency has been defined by the Institute of Medicine  and an Endocrine Society practice guideline as a level of serum 25-OH  vitamin D  less than 20 ng/mL (1,2). The Endocrine Society went on to  further define vitamin D  insufficiency as a level between 21 and 29  ng/mL (2).  1. IOM (Institute of Medicine). 2010. Dietary reference intakes for  calcium  and D. [place]: [employer]. 2. Holick MF, Binkley Forsyth, Bischoff-Ferrari HA, et al. Evaluation,  treatment, and prevention of vitamin D  deficiency: an Endocrine  Society clinical practice guideline, JCEM. 2011 Jul; 96(7): 1911-30.  Performed at [facility], [address], [place], [place] [address]   Admission on 07/27/2024, Discharged on 07/28/2024  Component Date Value Ref Range Status   WBC 07/28/2024 10.4  4.0 - 10.5 K/uL Final   RBC 07/28/2024 4.98  3.87 - 5.11 MIL/uL Final   Hemoglobin 07/28/2024 10.1 (L)  12.0 - 15.0 g/dL Final   HCT 89/71/7974 34.4 (L)  36.0 - 46.0 % Final   MCV 07/28/2024 69.1 (L)  80.0 - 100.0 fL Final   MCH 07/28/2024 20.3 (L)  26.0 - 34.0 pg Final   MCHC 07/28/2024 29.4 (L)  30.0 - 36.0 g/dL Final   RDW 89/71/7974 19.6 (H)  11.5 - 15.5 % Final   Platelets 07/28/2024 389  150 - 400 K/uL Final   REPEATED TO VERIFY   nRBC 07/28/2024 0.0  0.0 - 0.2 % Final   Neutrophils Relative % 07/28/2024 50  % Final   Neutro Abs 07/28/2024 5.2  1.7 - 7.7 K/uL Final   Lymphocytes Relative 07/28/2024 42  % Final   Lymphs Abs 07/28/2024 4.3 (  H)  0.7 - 4.0 K/uL Final    Monocytes Relative 07/28/2024 6  % Final   Monocytes Absolute 07/28/2024 0.6  0.1 - 1.0 K/uL Final   Eosinophils Relative 07/28/2024 1  % Final   Eosinophils Absolute 07/28/2024 0.1  0.0 - 0.5 K/uL Final   Basophils Relative 07/28/2024 1  % Final   Basophils Absolute 07/28/2024 0.1  0.0 - 0.1 K/uL Final   WBC Morphology 07/28/2024 MORPHOLOGY UNREMARKABLE   Final   RBC Morphology 07/28/2024 MORPHOLOGY UNREMARKABLE   Final   Smear Review 07/28/2024 Normal platelet morphology   Final   Immature Granulocytes 07/28/2024 0  % Final   Abs Immature Granulocytes 07/28/2024 0.04  0.00 - 0.07 K/uL Final   Performed at [facility], [address], [place], [place] [address]   Sodium 07/28/2024 137  135 - 145 mmol/L Final   Potassium 07/28/2024 4.2  3.5 - 5.1 mmol/L Final   Chloride 07/28/2024 101  98 - 111 mmol/L Final   CO2 07/28/2024 25  22 - 32 mmol/L Final   Glucose, Bld 07/28/2024 95  70 - 99 mg/dL Final   Glucose reference range applies only to samples taken after fasting for at least 8 hours.   BUN 07/28/2024 13  6 - 20 mg/dL Final   Creatinine, Ser 07/28/2024 0.75  0.44 - 1.00 mg/dL Final   Calcium  07/28/2024 9.4  8.9 - 10.3 mg/dL Final   Total Protein 89/71/7974 8.0  6.5 - 8.1 g/dL Final   Albumin 89/71/7974 3.9  3.5 - 5.0 g/dL Final   AST 89/71/7974 15  15 - 41 U/L Final   ALT 07/28/2024 11  0 - 44 U/L Final   Alkaline Phosphatase 07/28/2024 65  38 - 126 U/L Final   Total Bilirubin 07/28/2024 0.7  0.0 - 1.2 mg/dL Final   GFR, Estimated 07/28/2024 >60  >60 mL/min Final   Comment: (NOTE) Calculated using the CKD-EPI Creatinine Equation (2021)    Anion gap 07/28/2024 11  5 - 15 Final   Performed at [facility], [address], [place], [place] [address]   Hgb A1c MFr Bld 07/28/2024 5.9 (H)  4.8 - 5.6 % Final   Comment: (NOTE) Diagnosis of Diabetes The following HbA1c ranges recommended by the American Diabetes Association (ADA) may be used as an aid in the diagnosis of  diabetes mellitus.  Hemoglobin             Suggested A1C NGSP%              Diagnosis  <5.7                   Non Diabetic  5.7-6.4                Pre-Diabetic  >6.4                   Diabetic  <7.0                   Glycemic control for                       adults with diabetes.     Mean Plasma Glucose 07/28/2024 122.63  mg/dL Final   Performed at [facility], [address], [place], [place] [address]   Preg Test, Ur 07/27/2024 Negative  Negative Final   POC Amphetamine UR 07/27/2024 None Detected  NONE DETECTED (Cut Off Level  1000 ng/mL) Final   POC Secobarbital (BAR) 07/27/2024 None Detected  NONE DETECTED (Cut Off Level 300 ng/mL) Final   POC Buprenorphine (BUP) 07/27/2024 None Detected  NONE DETECTED (Cut Off Level 10 ng/mL) Final   POC Oxazepam (BZO) 07/27/2024 None Detected  NONE DETECTED (Cut Off Level 300 ng/mL) Final   POC Cocaine UR 07/27/2024 None Detected  NONE DETECTED (Cut Off Level 300 ng/mL) Final   POC Methamphetamine UR 07/27/2024 None Detected  NONE DETECTED (Cut Off Level 1000 ng/mL) Final   POC Morphine  07/27/2024 None Detected  NONE DETECTED (Cut Off Level 300 ng/mL) Final   POC Methadone UR 07/27/2024 None Detected  NONE DETECTED (Cut Off Level 300 ng/mL) Final   POC Oxycodone  UR 07/27/2024 None Detected  NONE DETECTED (Cut Off Level 100 ng/mL) Final   POC Marijuana UR 07/27/2024 None Detected  NONE DETECTED (Cut Off Level 50 ng/mL) Final  Admission on 06/22/2024, Discharged on 06/23/2024  Component Date Value Ref Range Status   WBC 06/22/2024 10.4  4.0 - 10.5 K/uL Final   RBC 06/22/2024 4.93  3.87 - 5.11 MIL/uL Final   Hemoglobin 06/22/2024 10.1 (L)  12.0 - 15.0 g/dL Final   HCT 90/77/7974 34.2 (L)  36.0 - 46.0 % Final   MCV 06/22/2024 69.4 (L)  80.0 - 100.0 fL Final   MCH 06/22/2024 20.5 (L)  26.0 - 34.0 pg Final   MCHC 06/22/2024 29.5 (L)  30.0 - 36.0 g/dL Final   RDW 90/77/7974 19.3 (H)  11.5 - 15.5 % Final   Platelets 06/22/2024 377  150 -  400 K/uL Final   REPEATED TO VERIFY   nRBC 06/22/2024 0.0  0.0 - 0.2 % Final   Neutrophils Relative % 06/22/2024 52  % Final   Neutro Abs 06/22/2024 5.4  1.7 - 7.7 K/uL Final   Lymphocytes Relative 06/22/2024 40  % Final   Lymphs Abs 06/22/2024 4.2 (H)  0.7 - 4.0 K/uL Final   Monocytes Relative 06/22/2024 6  % Final   Monocytes Absolute 06/22/2024 0.6  0.1 - 1.0 K/uL Final   Eosinophils Relative 06/22/2024 1  % Final   Eosinophils Absolute 06/22/2024 0.1  0.0 - 0.5 K/uL Final   Basophils Relative 06/22/2024 1  % Final   Basophils Absolute 06/22/2024 0.1  0.0 - 0.1 K/uL Final   Immature Granulocytes 06/22/2024 0  % Final   Abs Immature Granulocytes 06/22/2024 0.02  0.00 - 0.07 K/uL Final   Performed at [facility], [address] [place], [place] [address]   Sodium 06/22/2024 135  135 - 145 mmol/L Final   Potassium 06/22/2024 3.2 (L)  3.5 - 5.1 mmol/L Final   Chloride 06/22/2024 100  98 - 111 mmol/L Final   CO2 06/22/2024 23  22 - 32 mmol/L Final   Glucose, Bld 06/22/2024 148 (H)  70 - 99 mg/dL Final   Glucose reference range applies only to samples taken after fasting for at least 8 hours.   BUN 06/22/2024 13  6 - 20 mg/dL Final   Creatinine, Ser 06/22/2024 0.74  0.44 - 1.00 mg/dL Final   Calcium  06/22/2024 9.1  8.9 - 10.3 mg/dL Final   Total Protein 90/77/7974 7.9  6.5 - 8.1 g/dL Final   Albumin 90/77/7974 3.5  3.5 - 5.0 g/dL Final   AST 90/77/7974 16  15 - 41 U/L Final   ALT 06/22/2024 9  0 - 44 U/L Final   Alkaline Phosphatase 06/22/2024 61  38 - 126 U/L  Final   Total Bilirubin 06/22/2024 0.8  0.0 - 1.2 mg/dL Final   GFR, Estimated 06/22/2024 >60  >60 mL/min Final   Comment: (NOTE) Calculated using the CKD-EPI Creatinine Equation (2021)    Anion gap 06/22/2024 12  5 - 15 Final   Performed at [facility], [address], [place], [place] [address]   Color, Urine 06/22/2024 YELLOW  YELLOW Final   APPearance 06/22/2024 HAZY (A)  CLEAR Final   Specific Gravity,  Urine 06/22/2024 1.019  1.005 - 1.030 Final   pH 06/22/2024 5.0  5.0 - 8.0 Final   Glucose, UA 06/22/2024 NEGATIVE  NEGATIVE mg/dL Final   Hgb urine dipstick 06/22/2024 LARGE (A)  NEGATIVE Final   Bilirubin Urine 06/22/2024 NEGATIVE  NEGATIVE Final   Ketones, ur 06/22/2024 NEGATIVE  NEGATIVE mg/dL Final   Protein, ur 90/77/7974 30 (A)  NEGATIVE mg/dL Final   Nitrite 90/77/7974 NEGATIVE  NEGATIVE Final   Leukocytes,Ua 06/22/2024 SMALL (A)  NEGATIVE Final   RBC / HPF 06/22/2024 >50  0 - 5 RBC/hpf Final   WBC, UA 06/22/2024 6-10  0 - 5 WBC/hpf Final   Bacteria, UA 06/22/2024 FEW (A)  NONE SEEN Final   Squamous Epithelial / HPF 06/22/2024 0-5  0 - 5 /HPF Final   Mucus 06/22/2024 PRESENT   Final   Performed at [facility], [address] [place], [place] [address]   hCG, Beta Chain, Quant, S 06/22/2024 <1  <5 mIU/mL Final   Comment:          GEST. AGE      CONC.  (mIU/mL)   <=1 WEEK        5 - 50     2 WEEKS       50 - 500     3 WEEKS       100 - 10,000     4 WEEKS     1,000 - 30,000     5 WEEKS     3,500 - 115,000   6-8 WEEKS     12,000 - 270,000    12 WEEKS     15,000 - 220,000        FEMALE AND NON-PREGNANT FEMALE:     LESS THAN 5 mIU/mL Performed at [facility], [address], [place], [place] [address]    Lipase 06/22/2024 20  11 - 51 U/L Final   Performed at [facility], [address], [place], [place] [address]   Specimen Description 06/23/2024 URINE, CLEAN CATCH   Final   Special Requests 06/23/2024    Final                   Value:NONE Performed at [facility], [address] [place], [place] [address]    Culture 06/23/2024 MULTIPLE SPECIES PRESENT, SUGGEST RECOLLECTION (A)   Final   Report Status 06/23/2024 06/23/2024 FINAL   Final   Yeast Wet Prep HPF POC 06/23/2024 NONE SEEN  NONE SEEN Final   Trich, Wet Prep 06/23/2024 NONE SEEN  NONE SEEN Final   Clue Cells Wet Prep HPF POC 06/23/2024 NONE SEEN  NONE SEEN Final   WBC, Wet  Prep HPF POC 06/23/2024 <10  <10 Final   Sperm 06/23/2024 NONE SEEN   Final   Performed at [facility], [address] [place], [place] [address]   Neisseria Gonorrhea 06/23/2024 Negative   Final   Chlamydia 06/23/2024 Negative  Final   Comment 06/23/2024 Normal Reference Ranger Chlamydia - Negative   Final   Comment 06/23/2024 Normal Reference Range Neisseria Gonorrhea - Negative   Final  Admission on 05/31/2024, Discharged on 05/31/2024  Component Date Value Ref Range Status   Specimen Source 05/31/2024 URINE, CLEAN CATCH   Final   Color, Urine 05/31/2024 YELLOW  YELLOW Final   APPearance 05/31/2024 CLEAR  CLEAR Final   Specific Gravity, Urine 05/31/2024 1.017  1.005 - 1.030 Final   pH 05/31/2024 5.0  5.0 - 8.0 Final   Glucose, UA 05/31/2024 NEGATIVE  NEGATIVE mg/dL Final   Hgb urine dipstick 05/31/2024 NEGATIVE  NEGATIVE Final   Bilirubin Urine 05/31/2024 NEGATIVE  NEGATIVE Final   Ketones, ur 05/31/2024 NEGATIVE  NEGATIVE mg/dL Final   Protein, ur 91/68/7974 NEGATIVE  NEGATIVE mg/dL Final   Nitrite 91/68/7974 NEGATIVE  NEGATIVE Final   Leukocytes,Ua 05/31/2024 NEGATIVE  NEGATIVE Final   RBC / HPF 05/31/2024 0-5  0 - 5 RBC/hpf Final   WBC, UA 05/31/2024 0-5  0 - 5 WBC/hpf Final   Comment:        Reflex urine culture not performed if WBC <=10, OR if Squamous epithelial cells >5. If Squamous epithelial cells >5 suggest recollection.    Bacteria, UA 05/31/2024 RARE (A)  NONE SEEN Final   Squamous Epithelial / HPF 05/31/2024 0-5  0 - 5 /HPF Final   Mucus 05/31/2024 PRESENT   Final   Performed at [facility], [address] [place], [place] [address]  Admission on 05/07/2024, Discharged on 05/07/2024  Component Date Value Ref Range Status   Color, Urine 05/07/2024 YELLOW  YELLOW Final   APPearance 05/07/2024 CLEAR  CLEAR Final   Specific Gravity, Urine 05/07/2024 1.014  1.005 - 1.030 Final   pH 05/07/2024 7.0  5.0 - 8.0 Final   Glucose, UA 05/07/2024 NEGATIVE   NEGATIVE mg/dL Final   Hgb urine dipstick 05/07/2024 NEGATIVE  NEGATIVE Final   Bilirubin Urine 05/07/2024 NEGATIVE  NEGATIVE Final   Ketones, ur 05/07/2024 NEGATIVE  NEGATIVE mg/dL Final   Protein, ur 91/92/7974 NEGATIVE  NEGATIVE mg/dL Final   Nitrite 91/92/7974 NEGATIVE  NEGATIVE Final   Leukocytes,Ua 05/07/2024 NEGATIVE  NEGATIVE Final   Performed at [facility], [address] [place], [place] [address]  Admission on 05/02/2024, Discharged on 05/02/2024  Component Date Value Ref Range Status   WBC 05/02/2024 9.4  4.0 - 10.5 K/uL Final   RBC 05/02/2024 4.77  3.87 - 5.11 MIL/uL Final   Hemoglobin 05/02/2024 9.7 (L)  12.0 - 15.0 g/dL Final   HCT 91/97/7974 33.2 (L)  36.0 - 46.0 % Final   MCV 05/02/2024 69.6 (L)  80.0 - 100.0 fL Final   MCH 05/02/2024 20.3 (L)  26.0 - 34.0 pg Final   MCHC 05/02/2024 29.2 (L)  30.0 - 36.0 g/dL Final   RDW 91/97/7974 19.3 (H)  11.5 - 15.5 % Final   Platelets 05/02/2024 350  150 - 400 K/uL Final   nRBC 05/02/2024 0.0  0.0 - 0.2 % Final   Neutrophils Relative % 05/02/2024 62  % Final   Neutro Abs 05/02/2024 5.8  1.7 - 7.7 K/uL Final   Lymphocytes Relative 05/02/2024 32  % Final   Lymphs Abs 05/02/2024 3.0  0.7 - 4.0 K/uL Final   Monocytes Relative 05/02/2024 2  % Final   Monocytes Absolute 05/02/2024 0.2  0.1 - 1.0 K/uL Final   Eosinophils Relative 05/02/2024 2  % Final  Eosinophils Absolute 05/02/2024 0.2  0.0 - 0.5 K/uL Final   Basophils Relative 05/02/2024 2  % Final   Basophils Absolute 05/02/2024 0.2 (H)  0.0 - 0.1 K/uL Final   WBC Morphology 05/02/2024 See Note   Final    Morphology unremarkable   RBC Morphology 05/02/2024 MORPHOLOGY UNREMARKABLE   Final    Morphology unremarkable   Smear Review 05/02/2024 See Note   Final   Comment:  Normal Platelet Morphology Performed at [facility], [address] [place], [place] [address]    Sodium 05/02/2024 137  135 - 145 mmol/L Final   Potassium 05/02/2024 3.6  3.5 - 5.1 mmol/L  Final   Chloride 05/02/2024 105  98 - 111 mmol/L Final   CO2 05/02/2024 22  22 - 32 mmol/L Final   Glucose, Bld 05/02/2024 144 (H)  70 - 99 mg/dL Final   Glucose reference range applies only to samples taken after fasting for at least 8 hours.   BUN 05/02/2024 9  6 - 20 mg/dL Final   Creatinine, Ser 05/02/2024 0.74  0.44 - 1.00 mg/dL Final   Calcium  05/02/2024 9.1  8.9 - 10.3 mg/dL Final   Total Protein 91/97/7974 7.7  6.5 - 8.1 g/dL Final   Albumin 91/97/7974 3.4 (L)  3.5 - 5.0 g/dL Final   AST 91/97/7974 13 (L)  15 - 41 U/L Final   ALT 05/02/2024 9  0 - 44 U/L Final   Alkaline Phosphatase 05/02/2024 57  38 - 126 U/L Final   Total Bilirubin 05/02/2024 0.7  0.0 - 1.2 mg/dL Final   GFR, Estimated 05/02/2024 >60  >60 mL/min Final   Comment: (NOTE) Calculated using the CKD-EPI Creatinine Equation (2021)    Anion gap 05/02/2024 10  5 - 15 Final   Performed at [facility], [address], [place], [place] [address]   Lipase 05/02/2024 24  11 - 51 U/L Final   Performed at [facility], [address], [place], [place] [address]  Admission on 03/09/2024, Discharged on 03/15/2024  Component Date Value Ref Range Status   Folate 03/10/2024 9.7  >5.9 ng/mL Final   Performed at [facility], [address] [place], [place] [address]   RPR Ser Ql 03/10/2024 NON REACTIVE  NON REACTIVE Final   Performed at [facility], [address] [place], [place] [address]   Vitamin B-12 03/10/2024 252  180 - 914 pg/mL Final   Comment: (NOTE) This assay is not validated for testing neonatal or myeloproliferative syndrome specimens for Vitamin B12 levels. Performed at [facility], [address], [place], [place] [address]    HIV Screen 4th Generation wRfx 03/10/2024 Non Reactive  Non Reactive Final   Performed at [facility], [address], [place], [place] [address]  Admission on 03/09/2024, Discharged on 03/09/2024  Component Date Value  Ref Range Status   WBC 03/09/2024 10.0  4.0 - 10.5 K/uL Final   RBC 03/09/2024 5.35 (H)  3.87 - 5.11 MIL/uL Final   Hemoglobin 03/09/2024 10.9 (L)  12.0 - 15.0 g/dL Final   HCT 93/90/7974 36.6  36.0 - 46.0 % Final   MCV 03/09/2024 68.4 (L)  80.0 - 100.0 fL Final   MCH 03/09/2024 20.4 (L)  26.0 - 34.0 pg Final   MCHC 03/09/2024 29.8 (L)  30.0 - 36.0 g/dL Final   RDW 93/90/7974 19.6 (H)  11.5 - 15.5 % Final   Platelets 03/09/2024  420 (H)  150 - 400 K/uL Final   REPEATED TO VERIFY   nRBC 03/09/2024 0.0  0.0 - 0.2 % Final   Neutrophils Relative % 03/09/2024 56  % Final   Neutro Abs 03/09/2024 5.7  1.7 - 7.7 K/uL Final   Lymphocytes Relative 03/09/2024 36  % Final   Lymphs Abs 03/09/2024 3.6  0.7 - 4.0 K/uL Final   Monocytes Relative 03/09/2024 5  % Final   Monocytes Absolute 03/09/2024 0.5  0.1 - 1.0 K/uL Final   Eosinophils Relative 03/09/2024 1  % Final   Eosinophils Absolute 03/09/2024 0.1  0.0 - 0.5 K/uL Final   Basophils Relative 03/09/2024 1  % Final   Basophils Absolute 03/09/2024 0.1  0.0 - 0.1 K/uL Final   Immature Granulocytes 03/09/2024 1  % Final   Abs Immature Granulocytes 03/09/2024 0.11 (H)  0.00 - 0.07 K/uL Final   Performed at [facility], [address], [place], [place] [address]   Sodium 03/09/2024 135  135 - 145 mmol/L Final   Potassium 03/09/2024 3.3 (L)  3.5 - 5.1 mmol/L Final   Chloride 03/09/2024 102  98 - 111 mmol/L Final   CO2 03/09/2024 26  22 - 32 mmol/L Final   Glucose, Bld 03/09/2024 120 (H)  70 - 99 mg/dL Final   Glucose reference range applies only to samples taken after fasting for at least 8 hours.   BUN 03/09/2024 8  6 - 20 mg/dL Final   Creatinine, Ser 03/09/2024 0.73  0.44 - 1.00 mg/dL Final   Calcium  03/09/2024 9.0  8.9 - 10.3 mg/dL Final   Total Protein 93/90/7974 8.6 (H)  6.5 - 8.1 g/dL Final   Albumin 93/90/7974 3.8  3.5 - 5.0 g/dL Final   AST 93/90/7974 16  15 - 41 U/L Final   ALT 03/09/2024 12  0 - 44 U/L Final   Alkaline  Phosphatase 03/09/2024 58  38 - 126 U/L Final   Total Bilirubin 03/09/2024 1.0  0.0 - 1.2 mg/dL Final   GFR, Estimated 03/09/2024 >60  >60 mL/min Final   Comment: (NOTE) Calculated using the CKD-EPI Creatinine Equation (2021)    Anion gap 03/09/2024 7  5 - 15 Final   Performed at [facility], [address], [place], [place] [address]   Hgb A1c MFr Bld 03/09/2024 6.3 (H)  4.8 - 5.6 % Final   Comment: (NOTE) Diagnosis of Diabetes The following HbA1c ranges recommended by the American Diabetes Association (ADA) may be used as an aid in the diagnosis of diabetes mellitus.  Hemoglobin             Suggested A1C NGSP%              Diagnosis  <5.7                   Non Diabetic  5.7-6.4                Pre-Diabetic  >6.4                   Diabetic  <7.0                   Glycemic control for                       adults with diabetes.     Mean Plasma Glucose 03/09/2024 134.11  mg/dL Final   Performed at [facility], [address] [place], [place] [address]   Cholesterol 03/09/2024 142  0 - 200 mg/dL Final   Triglycerides 93/90/7974 99  <150 mg/dL Final   HDL 93/90/7974 29 (L)  >40 mg/dL Final   Total CHOL/HDL Ratio 03/09/2024 4.9  RATIO Final   VLDL 03/09/2024 20  0 - 40 mg/dL Final   LDL Cholesterol 03/09/2024 93  0 - 99 mg/dL Final   Comment:        Total Cholesterol/HDL:CHD Risk Coronary Heart Disease Risk Table                     Men   Women  1/2 Average Risk   3.4   3.3  Average Risk       5.0   4.4  2 X Average Risk   9.6   7.1  3 X Average Risk  23.4   11.0        Use the calculated Patient Ratio above and the CHD Risk Table to determine the patient's CHD Risk.        ATP III CLASSIFICATION (LDL):  <100     mg/dL   Optimal  899-870  mg/dL   Near or Above                    Optimal  130-159  mg/dL   Borderline  839-810  mg/dL   High  >809     mg/dL   Very High Performed at [facility], [address], [place], [place] [address]    TSH  03/09/2024 0.836  0.350 - 4.500 uIU/mL Final   Comment: Performed by a 3rd Generation assay with a functional sensitivity of <=0.01 uIU/mL. Performed at [facility], [address], [place], [place] [address]    Alcohol, Ethyl (B) 03/09/2024 <15  <15 mg/dL Final   Comment: (NOTE) For medical purposes only. Performed at [facility], [address] [place], [place] [address]    POC Amphetamine UR 03/09/2024 None Detected   Final   POC Secobarbital (BAR) 03/09/2024 None Detected   Final   POC Buprenorphine (BUP) 03/09/2024 None Detected   Final   POC Oxazepam (BZO) 03/09/2024 None Detected   Final   POC Cocaine UR 03/09/2024 None Detected   Final   POC Methamphetamine UR 03/09/2024 None Detected   Final   POC Morphine  03/09/2024 None Detected   Final   POC Methadone UR 03/09/2024 None Detected   Final   POC Oxycodone  UR 03/09/2024 None Detected   Final   POC Marijuana UR 03/09/2024 None Detected   Final   Vit D, 25-Hydroxy 03/09/2024 6.53 (L)  30 - 100 ng/mL Final   Comment: (NOTE) Vitamin D  deficiency has been defined by the Institute of Medicine  and an Endocrine Society practice guideline as a level of serum 25-OH  vitamin D  less than 20 ng/mL (1,2). The Endocrine Society went on to  further define vitamin D  insufficiency as a level between 21 and 29  ng/mL (2).  1. IOM (Institute of Medicine). 2010. Dietary reference intakes for  calcium  and D. [place]: [employer]. 2. Holick MF, Binkley [place], Bischoff-Ferrari HA, et al. Evaluation,  treatment, and prevention of vitamin D  deficiency: an Endocrine  Society clinical practice guideline, JCEM. 2011 Jul; 96(7): 1911-30.  Performed at [facility], [address], [place], [place] [address]    Preg Test, Ur 03/09/2024 NEGATIVE  NEGATIVE Final   Comment:        THE SENSITIVITY OF THIS METHODOLOGY IS >24 mIU/mL    Color, Urine 03/09/2024 AMBER (A)  YELLOW Final   BIOCHEMICALS MAY BE  AFFECTED BY COLOR   APPearance 03/09/2024 HAZY (A)  CLEAR Final   Specific Gravity, Urine 03/09/2024 1.025  1.005 - 1.030 Final   pH 03/09/2024 5.0  5.0 - 8.0 Final   Glucose, UA 03/09/2024 NEGATIVE  NEGATIVE mg/dL Final   Hgb urine dipstick 03/09/2024 LARGE (A)  NEGATIVE Final   Bilirubin Urine 03/09/2024 NEGATIVE  NEGATIVE Final   Ketones, ur 03/09/2024 NEGATIVE  NEGATIVE mg/dL Final   Protein, ur 93/90/7974 100 (A)  NEGATIVE mg/dL Final   Nitrite 93/90/7974 NEGATIVE  NEGATIVE Final   Leukocytes,Ua 03/09/2024 TRACE (A)  NEGATIVE Final   RBC / HPF 03/09/2024 >50  0 - 5 RBC/hpf Final   WBC, UA 03/09/2024 0-5  0 - 5 WBC/hpf Final   Bacteria, UA 03/09/2024 NONE SEEN  NONE SEEN Final   Squamous Epithelial / HPF 03/09/2024 0-5  0 - 5 /HPF Final   Mucus 03/09/2024 PRESENT   Final   Performed at [facility], [address] [place], [place] [address]   Allergies: Latex, Zithromax  [azithromycin ], and Zofran  [ondansetron ]  Medications:  Facility Ordered Medications  Medication   acetaminophen  (TYLENOL ) tablet 650 mg   alum & mag hydroxide-simeth (MAALOX/MYLANTA) 200-200-20 MG/5ML suspension 30 mL   magnesium  hydroxide (MILK OF MAGNESIA) suspension 30 mL   haloperidol  (HALDOL ) tablet 5 mg   And   diphenhydrAMINE  (BENADRYL ) capsule 50 mg   haloperidol  lactate (HALDOL ) injection 5 mg   And   diphenhydrAMINE  (BENADRYL ) injection 50 mg   And   LORazepam  (ATIVAN ) injection 2 mg   haloperidol  lactate (HALDOL ) injection 10 mg   And   diphenhydrAMINE  (BENADRYL ) injection 50 mg   And   LORazepam  (ATIVAN ) injection 2 mg   traZODone  (DESYREL ) tablet 50 mg   hydrOXYzine  (ATARAX ) tablet 25 mg   PTA Medications  Medication Sig   ARIPiprazole  (ABILIFY ) 5 MG tablet Take 1 tablet (5 mg total) by mouth daily.   hydrOXYzine  (ATARAX ) 25 MG tablet Take 1 tablet (25 mg total) by mouth 3 (three) times daily as needed for anxiety.   traZODone  (DESYREL ) 50 MG tablet Take 1 tablet (50 mg total) by  mouth at bedtime as needed for sleep.     Medical Decision Making  On assessment patient is guarded, with impoverished garbled speech and labile, irritable, angry, depressed and anxious mood with congruent and tearful affect.  Patient is a poor historian, with recent housing instability and medication noncompliance.  Patient may benefit from LAI for mood regulation, however not agreeable to this.  Given patient's history of poor outpatient follow-up and medication noncompliance, patient may not benefit from repeated hospitalization, plan to admit to observation unit (ED if beds full) and reevaluate for further disposition tomorrow morning.  This patient's history of emotional instability, unstable relationships, current SI and history of brief psychotic episode are most consistent with borderline personality disorder.  Labs ordered: CBC, CMP, mag, UDS, A1c, TSH, lipids, bhcg, GC/chlamydia, RPR, hepatitis, EKG    -Tylenol  650 mg every 6 hours as needed for pain -Imodium 2 to 4 mg as needed for diarrhea or loose stools  -Maalox/Mylanta 30 mL every 4 hours as needed for indigestion -Milk of Mag 30 mL as needed for constipation   -Hydroxyzine  25 mg TID PRN for anxiety -Trazodone  50  mg qhs PRN for sleep  Recommendations  Based on my evaluation the patient does not appear to have an emergency medical condition.  Alfornia Light, DO 08/12/24  7:26 PM
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
## 2024-08-12 NOTE — ED Notes (Signed)
 Pt resting in bed, eating meal, no acute distress noted. Will continue to monitor for safety.

## 2024-08-13 ENCOUNTER — Encounter (HOSPITAL_COMMUNITY): Payer: Self-pay | Admitting: Student

## 2024-08-13 ENCOUNTER — Inpatient Hospital Stay (HOSPITAL_COMMUNITY)
Admission: AD | Admit: 2024-08-13 | Discharge: 2024-08-21 | DRG: 885 | Disposition: A | Discharge status: Home / Self Care | Payer: MEDICAID | Source: Intra-hospital

## 2024-08-13 ENCOUNTER — Other Ambulatory Visit: Payer: Self-pay

## 2024-08-13 DIAGNOSIS — Z8249 Family history of ischemic heart disease and other diseases of the circulatory system: Secondary | ICD-10-CM

## 2024-08-13 DIAGNOSIS — R45851 Suicidal ideations: Secondary | ICD-10-CM | POA: Diagnosis present

## 2024-08-13 DIAGNOSIS — Z86711 Personal history of pulmonary embolism: Secondary | ICD-10-CM

## 2024-08-13 DIAGNOSIS — Z5900 Homelessness unspecified: Secondary | ICD-10-CM | POA: Diagnosis not present

## 2024-08-13 DIAGNOSIS — Z8616 Personal history of COVID-19: Secondary | ICD-10-CM | POA: Diagnosis not present

## 2024-08-13 DIAGNOSIS — K219 Gastro-esophageal reflux disease without esophagitis: Secondary | ICD-10-CM | POA: Diagnosis present

## 2024-08-13 DIAGNOSIS — F4312 Post-traumatic stress disorder, chronic: Secondary | ICD-10-CM | POA: Diagnosis present

## 2024-08-13 DIAGNOSIS — Z833 Family history of diabetes mellitus: Secondary | ICD-10-CM

## 2024-08-13 DIAGNOSIS — F332 Major depressive disorder, recurrent severe without psychotic features: Secondary | ICD-10-CM | POA: Diagnosis present

## 2024-08-13 DIAGNOSIS — F314 Bipolar disorder, current episode depressed, severe, without psychotic features: Secondary | ICD-10-CM | POA: Diagnosis present

## 2024-08-13 DIAGNOSIS — I10 Essential (primary) hypertension: Secondary | ICD-10-CM | POA: Diagnosis present

## 2024-08-13 DIAGNOSIS — Z560 Unemployment, unspecified: Secondary | ICD-10-CM | POA: Diagnosis not present

## 2024-08-13 DIAGNOSIS — R000 Tachycardia, unspecified: Secondary | ICD-10-CM | POA: Diagnosis present

## 2024-08-13 DIAGNOSIS — Z8632 Personal history of gestational diabetes: Secondary | ICD-10-CM | POA: Diagnosis not present

## 2024-08-13 DIAGNOSIS — F4310 Post-traumatic stress disorder, unspecified: Secondary | ICD-10-CM | POA: Diagnosis present

## 2024-08-13 DIAGNOSIS — F411 Generalized anxiety disorder: Secondary | ICD-10-CM | POA: Diagnosis present

## 2024-08-13 DIAGNOSIS — Z818 Family history of other mental and behavioral disorders: Secondary | ICD-10-CM

## 2024-08-13 DIAGNOSIS — Z803 Family history of malignant neoplasm of breast: Secondary | ICD-10-CM

## 2024-08-13 DIAGNOSIS — Z5941 Food insecurity: Secondary | ICD-10-CM

## 2024-08-13 DIAGNOSIS — F603 Borderline personality disorder: Secondary | ICD-10-CM | POA: Diagnosis present

## 2024-08-13 DIAGNOSIS — F333 Major depressive disorder, recurrent, severe with psychotic symptoms: Secondary | ICD-10-CM | POA: Diagnosis present

## 2024-08-13 DIAGNOSIS — Z91148 Patient's other noncompliance with medication regimen for other reason: Secondary | ICD-10-CM | POA: Diagnosis not present

## 2024-08-13 DIAGNOSIS — F329 Major depressive disorder, single episode, unspecified: Principal | ICD-10-CM | POA: Diagnosis present

## 2024-08-13 DIAGNOSIS — Z7722 Contact with and (suspected) exposure to environmental tobacco smoke (acute) (chronic): Secondary | ICD-10-CM | POA: Diagnosis present

## 2024-08-13 DIAGNOSIS — Z79899 Other long term (current) drug therapy: Secondary | ICD-10-CM

## 2024-08-13 LAB — GC/CHLAMYDIA PROBE AMP (~~LOC~~) NOT AT ARMC
Chlamydia: NEGATIVE
Comment: NEGATIVE
Comment: NORMAL
Neisseria Gonorrhea: NEGATIVE

## 2024-08-13 LAB — RPR: RPR Ser Ql: NONREACTIVE

## 2024-08-13 MED ORDER — DIPHENHYDRAMINE HCL 50 MG/ML IJ SOLN: 50.0000 mg | Freq: Three times a day (TID) | INTRAMUSCULAR | Status: DC | PRN

## 2024-08-13 MED ORDER — LORAZEPAM 2 MG/ML IJ SOLN: 2.0000 mg | Freq: Three times a day (TID) | INTRAMUSCULAR | Status: DC | PRN

## 2024-08-13 MED ORDER — ALUM & MAG HYDROXIDE-SIMETH 200-200-20 MG/5ML PO SUSP: 30.0000 mL | ORAL | Status: DC | PRN

## 2024-08-13 MED ORDER — HALOPERIDOL 5 MG PO TABS
5.0000 mg | ORAL_TABLET | Freq: Three times a day (TID) | ORAL | Status: DC | PRN
  Filled 2024-08-13: qty 1

## 2024-08-13 MED ORDER — DIPHENHYDRAMINE HCL 25 MG PO CAPS
50.0000 mg | ORAL_CAPSULE | Freq: Three times a day (TID) | ORAL | Status: DC | PRN
  Filled 2024-08-13: qty 2

## 2024-08-13 MED ORDER — HALOPERIDOL LACTATE 5 MG/ML IJ SOLN: 10.0000 mg | Freq: Three times a day (TID) | INTRAMUSCULAR | Status: DC | PRN

## 2024-08-13 MED ORDER — NICOTINE 21 MG/24HR TD PT24
21.0000 mg | MEDICATED_PATCH | Freq: Every day | TRANSDERMAL | Status: DC
  Filled 2024-08-13 (×2): qty 1

## 2024-08-13 MED ORDER — MAGNESIUM HYDROXIDE 400 MG/5ML PO SUSP: 30.0000 mL | Freq: Every day | ORAL | Status: DC | PRN

## 2024-08-13 MED ORDER — ACETAMINOPHEN 325 MG PO TABS: 650.0000 mg | ORAL_TABLET | Freq: Four times a day (QID) | ORAL | Status: DC | PRN

## 2024-08-13 MED ORDER — HALOPERIDOL LACTATE 5 MG/ML IJ SOLN: 5.0000 mg | Freq: Three times a day (TID) | INTRAMUSCULAR | Status: DC | PRN

## 2024-08-13 NOTE — ED Provider Notes (Signed)
 FBC/OBS ASAP Discharge Summary  Date and Time: 08/13/2024 3:17 PM  Name: Melinda Jimenez  MRN:  [id number]   Discharge Diagnoses:  Final diagnoses:  Severe episode of recurrent major depressive disorder, without psychotic features (HCC)    Subjective:  The patient is a 38 year old female with a history of MDD and bipolar disorder, two recent [facility] admissions, the last occurring two weeks prior to this encounter for suicidal thoughts. Hospitalization before that in June appears to have been for HI with possible paranoia. On the present occasion she presented to the [facility] reporting suicidal thoughts.   Stay Summary:  Patient was considered for discharge with outpatient follow up after overnight stay on the observation unit. However, on interview with the attending psychiatrist today the patient expressed her plan to immediately cut her wrists upon leaving and try to commit suicide. She expressed vague thoughts that indicated paranoia: they are tormenting me. She appears depressed and hopeless. She is agreeable to voluntary hospitalization. She has been accepted at [facility] for today.  Total Time spent with patient: 20 min  Past Psychiatric History: as above Past Medical History: per H and P Family History: none Family Psychiatric History: none Social History: per H and P Tobacco Cessation:  A prescription for an FDA-approved tobacco cessation medication provided at discharge   Current Medications:  Current Facility-Administered Medications  Medication Dose Route Frequency Provider Last Rate Last Admin   acetaminophen  (TYLENOL ) tablet 650 mg  650 mg Oral Q6H PRN Faunce, Alina, DO       alum & mag hydroxide-simeth (MAALOX/MYLANTA) 200-200-20 MG/5ML suspension 30 mL  30 mL Oral Q4H PRN Faunce, Alina, DO       haloperidol  (HALDOL ) tablet 5 mg  5 mg Oral TID PRN Faunce, Alina, DO       And   diphenhydrAMINE  (BENADRYL ) capsule 50 mg  50 mg Oral TID PRN Faunce, Alina, DO       haloperidol  lactate  (HALDOL ) injection 5 mg  5 mg Intramuscular TID PRN Faunce, Alina, DO       And   diphenhydrAMINE  (BENADRYL ) injection 50 mg  50 mg Intramuscular TID PRN Faunce, Alina, DO       And   LORazepam  (ATIVAN ) injection 2 mg  2 mg Intramuscular TID PRN Faunce, Alina, DO       haloperidol  lactate (HALDOL ) injection 10 mg  10 mg Intramuscular TID PRN Faunce, Alina, DO       And   diphenhydrAMINE  (BENADRYL ) injection 50 mg  50 mg Intramuscular TID PRN Faunce, Alina, DO       And   LORazepam  (ATIVAN ) injection 2 mg  2 mg Intramuscular TID PRN Faunce, Alina, DO       hydrOXYzine  (ATARAX ) tablet 25 mg  25 mg Oral TID PRN Faunce, Alina, DO       magnesium  hydroxide (MILK OF MAGNESIA) suspension 30 mL  30 mL Oral Daily PRN Faunce, Alina, DO       traZODone  (DESYREL ) tablet 50 mg  50 mg Oral QHS PRN Faunce, Alina, DO       Current Outpatient Medications  Medication Sig Dispense Refill   ARIPiprazole  (ABILIFY ) 5 MG tablet Take 1 tablet (5 mg total) by mouth daily. (Patient not taking: Reported on 08/13/2024) 30 tablet 0   hydrOXYzine  (ATARAX ) 25 MG tablet Take 1 tablet (25 mg total) by mouth 3 (three) times daily as needed for anxiety. (Patient not taking: Reported on 08/13/2024) 30 tablet 0   traZODone  (  DESYREL ) 50 MG tablet Take 1 tablet (50 mg total) by mouth at bedtime as needed for sleep. (Patient not taking: Reported on 08/13/2024) 30 tablet 0    PTA Medications:  Facility Ordered Medications  Medication   acetaminophen  (TYLENOL ) tablet 650 mg   alum & mag hydroxide-simeth (MAALOX/MYLANTA) 200-200-20 MG/5ML suspension 30 mL   magnesium  hydroxide (MILK OF MAGNESIA) suspension 30 mL   haloperidol  (HALDOL ) tablet 5 mg   And   diphenhydrAMINE  (BENADRYL ) capsule 50 mg   haloperidol  lactate (HALDOL ) injection 5 mg   And   diphenhydrAMINE  (BENADRYL ) injection 50 mg   And   LORazepam  (ATIVAN ) injection 2 mg   haloperidol  lactate (HALDOL ) injection 10 mg   And   diphenhydrAMINE  (BENADRYL ) injection  50 mg   And   LORazepam  (ATIVAN ) injection 2 mg   traZODone  (DESYREL ) tablet 50 mg   hydrOXYzine  (ATARAX ) tablet 25 mg   PTA Medications  Medication Sig   ARIPiprazole  (ABILIFY ) 5 MG tablet Take 1 tablet (5 mg total) by mouth daily. (Patient not taking: Reported on 08/13/2024)   hydrOXYzine  (ATARAX ) 25 MG tablet Take 1 tablet (25 mg total) by mouth 3 (three) times daily as needed for anxiety. (Patient not taking: Reported on 08/13/2024)   traZODone  (DESYREL ) 50 MG tablet Take 1 tablet (50 mg total) by mouth at bedtime as needed for sleep. (Patient not taking: Reported on 08/13/2024)       02/17/2020   10:08 AM 05/14/2017    1:05 PM  Depression screen PHQ 2/9  Decreased Interest 3 0  Down, Depressed, Hopeless 3 0  PHQ - 2 Score 6 0  Altered sleeping 3 0  Tired, decreased energy 3 3  Change in appetite 1 0  Feeling bad or failure about yourself  3 0  Trouble concentrating 3 0  Moving slowly or fidgety/restless 2 0  Suicidal thoughts 2 0  PHQ-9 Score 23  3      Data saved with a previous flowsheet row definition    Flowsheet Row ED from 08/12/2024 in [facility] (Discharged[facility] from 07/27/2024 in [facility]  C-SSRS RISK CATEGORY High Risk High Risk High Risk   Musculoskeletal  Strength & Muscle Tone: within normal limits Gait & Station: normal Patient leans: N/A  Psychiatric Specialty Exam  Presentation General Appearance: Appropriate for Environment  Eye Contact:Fair  Speech:Clear and Coherent  Speech Volume:Normal  Handedness:-- (not assessed)   Mood and Affect  Mood:Euthymic  Affect:Congruent   Thought Process  Thought Processes:Coherent; Linear  Descriptions of Associations:Intact  Orientation:Full (Time, Place and Person)  Thought Content:Logical    Hallucinations:Hallucinations: None  Ideas of  Reference:None  Suicidal Thoughts:Suicidal Thoughts: yes, active  Homicidal Thoughts:Homicidal Thoughts: No   Sensorium  Memory:Immediate Fair; Recent Fair; Remote Fair  Judgment:Fair  Insight:Fair   Executive Functions  Concentration:Fair  Attention Span:Fair  Recall:Fair  Fund of Knowledge:Fair  Language:Fair   Psychomotor Activity  Psychomotor Activity:Psychomotor Activity: Normal   Assets  Assets:Communication Skills; Resilience   Sleep  Sleep:Sleep: Fair    Physical Exam Constitutional:      Appearance: the patient is not toxic-appearing.  Pulmonary:     Effort: Pulmonary effort is normal.  Neurological:     General: No focal deficit present.     Mental Status: the patient is alert and oriented to person, place, and time.   Review of Systems  Respiratory:  Negative for  shortness of breath.   Cardiovascular:  Negative for chest pain.  Gastrointestinal:  Negative for abdominal pain, constipation, diarrhea, nausea and vomiting.  Neurological:  Negative for headaches.    BP (!) 139/98 (BP Location: Left Arm)   Pulse 81   Temp 97.7 F (36.5 C) (Oral)   Resp 18   SpO2 100%   Demographic Factors:  None pertinent   Loss Factors: NA   Historical Factors: NA   Risk Reduction Factors:   Positive social support, Positive therapeutic relationship, and Positive coping skills or problem solving skills   Continued Clinical Symptoms:  Mood instability   Cognitive Features That Contribute To Risk:  None     Suicide Risk:  High   Plan Of Care/Follow-up recommendations:  Activity as tolerated. Diet as recommended by PCP. Keep all scheduled follow-up appointments as recommended.   Patient is instructed to take all prescribed medications as recommended. Report any side effects or adverse reactions to your outpatient psychiatrist. Patient is instructed to abstain from alcohol and illegal drugs while on prescription medications. In the event of  worsening symptoms, patient is instructed to call the crisis hotline, 911, or go to the nearest emergency department for evaluation and treatment.     Patient is also instructed prior to discharge to: Take all medications as prescribed by mental healthcare provider. Report any adverse effects and or reactions from the medicines to outpatient provider promptly. Patient has been instructed & cautioned: To not engage in alcohol and or illegal drug use while on prescription medicines. In the event of worsening symptoms,  patient is instructed to call the crisis hotline, 911 and or go to the nearest ED for appropriate evaluation and treatment of symptoms. To follow-up with primary care provider for other medical issues, concerns and or health care needs    Disposition: [facility]   Karleen Kaufmann, MD 08/13/2024, 3:17 PM

## 2024-08-13 NOTE — Tx Team (Signed)
 Initial Treatment Plan 08/13/2024 10:47 PM Melinda Jimenez FMW:[id number]    PATIENT STRESSORS: Financial difficulties   Medication change or noncompliance     PATIENT STRENGTHS: Average or above average intelligence  Motivation for treatment/growth    PATIENT IDENTIFIED PROBLEMS: Suicidal ideation  Depression  Anxiety  (Pt wants outpatient therapy)               DISCHARGE CRITERIA:  Improved stabilization in mood, thinking, and/or behavior Motivation to continue treatment in a less acute level of care Verbal commitment to aftercare and medication compliance  PRELIMINARY DISCHARGE PLAN: Outpatient therapy Return to previous living arrangement  PATIENT/FAMILY INVOLVEMENT: This treatment plan has been presented to and reviewed with the patient, Melinda Jimenez, and/or family member.  The patient and family have been given the opportunity to ask questions and make suggestions.  Loetta DELENA Pinal, RN 08/13/2024, 10:47 PM

## 2024-08-13 NOTE — Progress Notes (Signed)
 Spoke with pt after she came out of the bathroom. Pt states that she was not angry or feeling aggressive towards her roommate. Someone else is on my mind. I was talking out loud. I told her she didn't have to move her stuff and that I wasn't bothering her. I wouldn't mess with her. Pt stated she needed some time to herself. This clinical research associate suggested the quiet room. Pt states she feels like she has her feelings bottled up inside. I don't want to bottle them up anymore. Pt assured that she can stay in quiet room as long as she needs to. Pt given something to eat and drink. Staff on 500 hall aware that this patient is in the quiet room. Will continue to monitor.

## 2024-08-13 NOTE — ED Notes (Signed)
 Pt sleeping in bed, respirations even and unlabored. Will continue to monitor for safety.

## 2024-08-13 NOTE — ED Notes (Signed)
 Pt sleeping in bed, respirations even and unlabored. No acute distress noted. Will continue to monitor for safety.

## 2024-08-13 NOTE — Progress Notes (Addendum)
 Report from staff on the 400 hall is that pt is being loud and aggressive towards roommate already in the room. During admission assessment, pt asked if she could have a room to herself. Pt told that this was not the policy here. I had a room to myself last time.  Pt was told that this is a conversation to have with her provider in the morning but this is the situation tonight. [facility] aware of pt behavior at present on the unit. Apparently, conversation was started earlier prior to pt being admitted and pt understood that she would not be in a room alone. Pt is now in the shower. Staff positioned on the hallway in front of pt's room because roommate is concerned. Will speak to pt once she is out of the shower.

## 2024-08-13 NOTE — Progress Notes (Signed)
   08/13/24 2150  Psych Admission Type (Psych Patients Only)  Admission Status Voluntary  Psychosocial Assessment  Patient Complaints Self-harm thoughts;Depression;Anxiety  Eye Contact Brief  Facial Expression Anxious  Affect Irritable;Depressed  Speech Logical/coherent  Interaction Assertive;Needy  Motor Activity Other (Comment) (wnl)  Appearance/Hygiene Unremarkable;In scrubs  Behavior Characteristics Cooperative;Anxious;Irritable  Mood Anxious;Irritable  Thought Process  Coherency Circumstantial  Content WDL  Delusions None reported or observed  Perception WDL  Hallucination None reported or observed  Judgment Limited  Confusion None  Danger to Self  Current suicidal ideation? Passive  Description of Suicide Plan slit wrists  Agreement Not to Harm Self Yes  Description of Agreement verbal  Danger to Others  Danger to Others None reported or observed

## 2024-08-13 NOTE — Progress Notes (Addendum)
 Patient is alert and oriented x 4 with no acute distress noted.  Patient is currently calm, cooperative, and denies SI/HI/AVH at this time.  Patient is aware of transfer to [facility], and awaiting EMTALA from the provider.  Provider notified at this time.  Vital signs stable. No issues noted or concerns voiced at this time.  Patient will continue Q 15 min safety checks for safety and behavior.

## 2024-08-13 NOTE — ED Notes (Signed)
 Patient alert, Mood appears calm. Respirations even and unlabored. No acute distress observed.  Resting in recliner asleep. Environment secured. Poc ongoing

## 2024-08-13 NOTE — ED Notes (Signed)
 Pt sleeping in bed, no acute distress noted. Respirations even and unlabored. Continue to monitor for safety.

## 2024-08-13 NOTE — Progress Notes (Signed)
 Safe Transport called for pt. Transfer to [facility].

## 2024-08-13 NOTE — ED Notes (Signed)
Report given to Elizabeth, RN at [facility]. 

## 2024-08-13 NOTE — ED Notes (Signed)
 Report called to Gerrie, RN for pt. Transfer to [facility].

## 2024-08-14 ENCOUNTER — Encounter (HOSPITAL_COMMUNITY): Payer: Self-pay

## 2024-08-14 DIAGNOSIS — F4310 Post-traumatic stress disorder, unspecified: Secondary | ICD-10-CM

## 2024-08-14 DIAGNOSIS — F411 Generalized anxiety disorder: Secondary | ICD-10-CM

## 2024-08-14 DIAGNOSIS — F314 Bipolar disorder, current episode depressed, severe, without psychotic features: Principal | ICD-10-CM

## 2024-08-14 MED ORDER — HYDROXYZINE HCL 25 MG PO TABS
25.0000 mg | ORAL_TABLET | Freq: Three times a day (TID) | ORAL | Status: DC | PRN
  Filled 2024-08-14: qty 1

## 2024-08-14 MED ORDER — ARIPIPRAZOLE 5 MG PO TABS
5.0000 mg | ORAL_TABLET | Freq: Every day | ORAL | Status: DC
  Administered 2024-08-14 – 2024-08-16 (×3): 5 mg via ORAL
  Filled 2024-08-14 (×4): qty 1

## 2024-08-14 MED ORDER — DOCUSATE SODIUM 100 MG PO CAPS
100.0000 mg | ORAL_CAPSULE | Freq: Two times a day (BID) | ORAL | Status: DC
  Filled 2024-08-14 (×2): qty 1

## 2024-08-14 MED ORDER — NIFEDIPINE ER OSMOTIC RELEASE 30 MG PO TB24
30.0000 mg | ORAL_TABLET | Freq: Every day | ORAL | Status: DC
  Administered 2024-08-21: 30 mg via ORAL
  Filled 2024-08-14 (×2): qty 1
  Filled 2024-08-14: qty 7
  Filled 2024-08-14 (×3): qty 1

## 2024-08-14 MED ORDER — FLUOXETINE HCL 10 MG PO CAPS
10.0000 mg | ORAL_CAPSULE | Freq: Every day | ORAL | Status: DC
  Administered 2024-08-14: 10 mg via ORAL
  Filled 2024-08-14 (×3): qty 1

## 2024-08-14 MED ORDER — FERROUS SULFATE 325 (65 FE) MG PO TABS
325.0000 mg | ORAL_TABLET | Freq: Two times a day (BID) | ORAL | Status: DC
  Administered 2024-08-14 – 2024-08-21 (×7): 325 mg via ORAL
  Filled 2024-08-14: qty 1
  Filled 2024-08-14: qty 14
  Filled 2024-08-14 (×8): qty 1

## 2024-08-14 MED ORDER — TRAZODONE HCL 50 MG PO TABS: 50.0000 mg | ORAL_TABLET | Freq: Every evening | ORAL | Status: DC | PRN

## 2024-08-14 NOTE — Group Note (Signed)
 Date:  08/14/2024 Time:  1:05 PM  Group Topic/Focus:  Wellness Toolbox:   The focus of this group is to discuss various aspects of wellness, balancing those aspects and exploring ways to increase the ability to experience wellness.  Patients will create a wellness toolbox for use upon discharge.    Participation Level:  Did Not Attend  Participation Quality:  Did Not Attend  Affect:  Did Not Attend  Cognitive:  Did Not Attend  Insight: None  Engagement in Group:  Did Not Attend  Modes of Intervention:  Did Not Attend  Additional Comments:  Did Not Attend  Melinda Jimenez 08/14/2024, 1:05 PM

## 2024-08-14 NOTE — Group Note (Unsigned)
 Date:  08/14/2024 Time:  4:41 PM  Group Topic/Focus:  Recovery Goals:   The focus of this group is to identify appropriate goals for recovery and establish a plan to achieve them.     Participation Level:  {[facility] PARTICIPATION OZCZO:77735}  Participation Quality:  {[facility] PARTICIPATION QUALITY:22265}  Affect:  {[facility] AFFECT:22266}  Cognitive:  {[facility] COGNITIVE:22267}  Insight: {[facility] Insight2:20797}  Engagement in Group:  {[facility] ENGAGEMENT IN HMNLE:77731}  Modes of Intervention:  {[facility] MODES OF INTERVENTION:22269}  Additional Comments:  ***  Melinda Jimenez 08/14/2024, 4:41 PM

## 2024-08-14 NOTE — Progress Notes (Signed)
 Patient refused all schedule medication. Patient states medication looked like poison. Staff encouraged patient for almost 10 minutes at the medication window, and also educated patient on what all medication are for, but she kept refusing and would not take them after they were pulled out of the sachets. Patient is safe on the unit at this time. Staff will continue to provide support to patient.

## 2024-08-14 NOTE — [facility] Group Notes (Signed)
 Melinda Jimenez did not attend wrap up group

## 2024-08-14 NOTE — BH IP Treatment Plan (Signed)
 Interdisciplinary Treatment and Diagnostic Plan Update  08/14/2024 Time of Session: 11:00 AM Melinda Jimenez MRN: [id number]  Principal Diagnosis: Major depressive disorder  Secondary Diagnoses: Principal Problem:   Major depressive disorder Active Problems:   Major depressive disorder, recurrent episode, severe (HCC)   Current Medications:  Current Facility-Administered Medications  Medication Dose Route Frequency Provider Last Rate Last Admin   acetaminophen  (TYLENOL ) tablet 650 mg  650 mg Oral Q6H PRN Marry Clamp, MD       alum & mag hydroxide-simeth (MAALOX/MYLANTA) 200-200-20 MG/5ML suspension 30 mL  30 mL Oral Q4H PRN Marry Clamp, MD       ARIPiprazole  (ABILIFY ) tablet 5 mg  5 mg Oral Daily Ntuen, Tina C, FNP       haloperidol  (HALDOL ) tablet 5 mg  5 mg Oral TID PRN Marry Clamp, MD       And   diphenhydrAMINE  (BENADRYL ) capsule 50 mg  50 mg Oral TID PRN Marry Clamp, MD       haloperidol  lactate (HALDOL ) injection 5 mg  5 mg Intramuscular TID PRN Marry Clamp, MD       And   diphenhydrAMINE  (BENADRYL ) injection 50 mg  50 mg Intramuscular TID PRN Marry Clamp, MD       And   LORazepam  (ATIVAN ) injection 2 mg  2 mg Intramuscular TID PRN Marry Clamp, MD       haloperidol  lactate (HALDOL ) injection 10 mg  10 mg Intramuscular TID PRN Marry Clamp, MD       And   diphenhydrAMINE  (BENADRYL ) injection 50 mg  50 mg Intramuscular TID PRN Marry Clamp, MD       And   LORazepam  (ATIVAN ) injection 2 mg  2 mg Intramuscular TID PRN Marry Clamp, MD       docusate sodium  (COLACE) capsule 100 mg  100 mg Oral BID Ntuen, Tina C, FNP       ferrous sulfate  tablet 325 mg  325 mg Oral BID WC Ntuen, Tina C, FNP       FLUoxetine  (PROZAC ) capsule 10 mg  10 mg Oral Daily Ntuen, Tina C, FNP       hydrOXYzine  (ATARAX ) tablet 25 mg  25 mg Oral TID PRN Ntuen, Tina C, FNP       magnesium  hydroxide (MILK OF MAGNESIA) suspension 30 mL  30 mL Oral Daily PRN Marry Clamp, MD        nicotine (NICODERM CQ - dosed in mg/24 hours) patch 21 mg  21 mg Transdermal Q0600 Ntuen, Tina C, FNP       NIFEdipine  (PROCARDIA -XL/NIFEDICAL-XL) 24 hr tablet 30 mg  30 mg Oral Daily Ntuen, Tina C, FNP       traZODone  (DESYREL ) tablet 50 mg  50 mg Oral QHS PRN Ntuen, Tina C, FNP       PTA Medications: Medications Prior to Admission  Medication Sig Dispense Refill Last Dose/Taking   hydrOXYzine  (ATARAX ) 25 MG tablet Take 1 tablet (25 mg total) by mouth 3 (three) times daily as needed for anxiety. (Patient not taking: Reported on 08/13/2024) 30 tablet 0    traZODone  (DESYREL ) 50 MG tablet Take 1 tablet (50 mg total) by mouth at bedtime as needed for sleep. (Patient not taking: Reported on 08/13/2024) 30 tablet 0     Patient Stressors: Financial difficulties   Medication change or noncompliance    Patient Strengths: Average or above average intelligence  Motivation for treatment/growth   Treatment Modalities: Medication Management, Group therapy, Case management,  1 to  1 session with clinician, Psychoeducation, Recreational therapy.   Physician Treatment Plan for Primary Diagnosis: Major depressive disorder Long Term Goal(s): Improvement in symptoms so as ready for discharge   Short Term Goals: Ability to identify changes in lifestyle to reduce recurrence of condition will improve Ability to verbalize feelings will improve Ability to disclose and discuss suicidal ideas Ability to demonstrate self-control will improve Ability to identify and develop effective coping behaviors will improve Ability to maintain clinical measurements within normal limits will improve Compliance with prescribed medications will improve Ability to identify triggers associated with substance abuse/mental health issues will improve  Medication Management: Evaluate patient's response, side effects, and tolerance of medication regimen.  Therapeutic Interventions: 1 to 1 sessions, Unit Group sessions and  Medication administration.  Evaluation of Outcomes: Not Progressing  Physician Treatment Plan for Secondary Diagnosis: Principal Problem:   Major depressive disorder Active Problems:   Major depressive disorder, recurrent episode, severe (HCC)  Long Term Goal(s): Improvement in symptoms so as ready for discharge   Short Term Goals: Ability to identify changes in lifestyle to reduce recurrence of condition will improve Ability to verbalize feelings will improve Ability to disclose and discuss suicidal ideas Ability to demonstrate self-control will improve Ability to identify and develop effective coping behaviors will improve Ability to maintain clinical measurements within normal limits will improve Compliance with prescribed medications will improve Ability to identify triggers associated with substance abuse/mental health issues will improve     Medication Management: Evaluate patient's response, side effects, and tolerance of medication regimen.  Therapeutic Interventions: 1 to 1 sessions, Unit Group sessions and Medication administration.  Evaluation of Outcomes: Not Progressing   RN Treatment Plan for Primary Diagnosis: Major depressive disorder Long Term Goal(s): Knowledge of disease and therapeutic regimen to maintain health will improve  Short Term Goals: Ability to remain free from injury will improve, Ability to verbalize frustration and anger appropriately will improve, Ability to demonstrate self-control, Ability to participate in decision making will improve, Ability to verbalize feelings will improve, Ability to disclose and discuss suicidal ideas, Ability to identify and develop effective coping behaviors will improve, and Compliance with prescribed medications will improve  Medication Management: RN will administer medications as ordered by provider, will assess and evaluate patient's response and provide education to patient for prescribed medication. RN will report  any adverse and/or side effects to prescribing provider.  Therapeutic Interventions: 1 on 1 counseling sessions, Psychoeducation, Medication administration, Evaluate responses to treatment, Monitor vital signs and CBGs as ordered, Perform/monitor CIWA, COWS, AIMS and Fall Risk screenings as ordered, Perform wound care treatments as ordered.  Evaluation of Outcomes: Not Progressing   LCSW Treatment Plan for Primary Diagnosis: Major depressive disorder Long Term Goal(s): Safe transition to appropriate next level of care at discharge, Engage patient in therapeutic group addressing interpersonal concerns.  Short Term Goals: Engage patient in aftercare planning with referrals and resources, Increase social support, Increase ability to appropriately verbalize feelings, Increase emotional regulation, Facilitate acceptance of mental health diagnosis and concerns, Facilitate patient progression through stages of change regarding substance use diagnoses and concerns, Identify triggers associated with mental health/substance abuse issues, and Increase skills for wellness and recovery  Therapeutic Interventions: Assess for all discharge needs, 1 to 1 time with Social worker, Explore available resources and support systems, Assess for adequacy in community support network, Educate family and significant other(s) on suicide prevention, Complete Psychosocial Assessment, Interpersonal group therapy.  Evaluation of Outcomes: Not Progressing   Progress in Treatment: Attending  groups: No. Participating in groups: No. Taking medication as prescribed: patient hasn't been prescribed any medications yet Toleration medication: Yes. Family/Significant other contact made: Yes, individual(s) contacted:  father,Ricky Ruggirello [phone] Patient understands diagnosis: Yes. Discussing patient identified problems/goals with staff: Yes. Medical problems stabilized or resolved: Yes. Denies suicidal/homicidal ideation:  Yes. Issues/concerns per patient self-inventory: No.  New problem(s) identified:  No  New Short Term/Long Term Goal(s):    medication stabilization, elimination of SI thoughts, development of comprehensive mental wellness plan.    Patient Goals:  I'm depressed and my energy is down.   Discharge Plan or Barriers:  Patient recently admitted. CSW will continue to follow and assess for appropriate referrals and possible discharge planning.    Reason for Continuation of Hospitalization: Depression Medication stabilization Suicidal ideation  Estimated Length of Stay:  5 - 7 days  Last 3 [place] Suicide Severity Risk Score: Flowsheet Row Admission (Current) [facility] from 08[facility]12/2024 in [facility] Ad[facility] (Discharged[facility]  C-SSRS RISK CATEGORY High Risk High Risk High Risk    Last PHQ 2/9 Scores:    02/17/2020   10:08 AM 05/14/2017    1:05 PM  Depression screen PHQ 2/9  Decreased Interest 3 0  Down, Depressed, Hopeless 3 0  PHQ - 2 Score 6 0  Altered sleeping 3 0  Tired, decreased energy 3 3  Change in appetite 1 0  Feeling bad or failure about yourself  3 0  Trouble concentrating 3 0  Moving slowly or fidgety/restless 2 0  Suicidal thoughts 2 0  PHQ-9 Score 23  3      Data saved with a previous flowsheet row definition    Scribe for Treatment Team: Inaki Vantine O Catalino Plascencia, LCSWA 08/14/2024 6:15 PM

## 2024-08-14 NOTE — Group Note (Signed)
 Date:  08/14/2024 Time:  10:18 AM  Group Topic/Focus:  Goals Group:   The focus of this group is to help patients establish daily goals to achieve during treatment and discuss how the patient can incorporate goal setting into their daily lives to aide in recovery.    Participation Level:  Did Not Attend  Participation Quality:  Did Not Attend  Affect:  Did Not Attend  Cognitive:  Did Not Attend  Insight: None  Engagement in Group:  Did Not Attend  Modes of Intervention:  Did Not Attend  Additional Comments:  Did Not Attend  Melinda Jimenez 08/14/2024, 10:18 AM

## 2024-08-14 NOTE — H&P (Signed)
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 tablets p.o. daily for high blood pressure --Initiate Iron tablet 25 mg p.o. twice daily for low hemoglobin --Initiate Colace 100 mg p.o. twice daily prophylaxis for constipation  Continue BH Agitation Protocol   Other PRN Medications  -Acetaminophen  650 mg every 6 as needed/mild pain  -Maalox 30 mL oral every 4 as needed/digestion  -Magnesium  hydroxide 30 mL daily as needed/mild constipation   --The risks/benefits/side-effects/alternatives to  this medication were discussed in detail with the patient and time was given for questions. The patient consents to medication trial.   -- Metabolic profile and EKG monitoring obtained while on an atypical antipsychotic (BMI: Lipid Panel: HbgA1c: QTc:)   -- Encouraged patient to participate in unit milieu and in scheduled group therapies   Safety and Monitoring:  Voluntary admission to inpatient psychiatric unit for safety, stabilization and treatment  Daily contact with patient to assess and evaluate symptoms and progress in treatment  Patient's case to be discussed in multi-disciplinary team meeting  Observation Level : q15 minute checks  Vital signs: q12 hours  Precautions: suicide, but pt currently verbally contracts for safety on unit?  Discharge Planning:  Social work and case management to assist with discharge planning and identification of hospital follow-up needs prior to discharge  Estimated LOS: 5-7?days  Discharge Concerns: Need to establish Safety plan; Medication compliance and effectiveness  Discharge Goals: Return home with outpatient referrals for mental health follow-up including medication management/psychotherapy.   Long Term Goal(s): Improvement in symptoms so as ready for discharge  Short Term Goals: Ability to identify changes in lifestyle to reduce recurrence of condition will improve, Ability to verbalize feelings will improve, Ability to disclose and discuss suicidal ideas, Ability to demonstrate self-control will improve, Ability to identify and develop effective coping behaviors will improve, Ability to maintain clinical measurements within normal limits will improve, Compliance with prescribed medications will improve, and Ability to identify triggers associated with substance abuse/mental health issues will improve  Physician Treatment Plan for Secondary Diagnosis: Principal Problem:   Major depressive disorder Active Problems:   Major depressive disorder,  recurrent episode, severe (HCC)  I certify that inpatient services furnished can reasonably be expected to improve the patient's condition.    Sergio Zawislak C Osbaldo Mark, FNP 11/14/202510:59 AM

## 2024-08-14 NOTE — Progress Notes (Signed)
   08/14/24 0823  Psych Admission Type (Psych Patients Only)  Admission Status Voluntary  Psychosocial Assessment  Patient Complaints Depression;Irritability;Anxiety  Eye Contact Brief  Facial Expression Anxious  Affect Irritable;Depressed  Speech Logical/coherent  Interaction Assertive;Needy  Motor Activity Other (Comment) (WNL)  Appearance/Hygiene Unremarkable  Behavior Characteristics Cooperative;Appropriate to situation  Mood Anxious;Irritable  Aggressive Behavior  Effect No apparent injury  Thought Process  Coherency Circumstantial  Content WDL  Delusions None reported or observed  Perception WDL  Hallucination None reported or observed  Judgment Limited  Confusion None  Danger to Self  Current suicidal ideation? Denies  Agreement Not to Harm Self Yes  Description of Agreement Verbal  Danger to Others  Danger to Others None reported or observed
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
## 2024-08-14 NOTE — Progress Notes (Signed)
 Pt is asleep in the quiet room on [address]. Will continue to monitor.

## 2024-08-14 NOTE — BHH Suicide Risk Assessment (Signed)
 BHH INPATIENT:  Family/Significant Other Suicide Prevention Education  Suicide Prevention Education:  Education Completed; father,Ricky Polizzi [phone]  (name of family member/significant other) has been identified by the patient as the family member/significant other with whom the patient will be residing, and identified as the person(s) who will aid the patient in the event of a mental health crisis (suicidal ideations/suicide attempt).  With written consent from the patient, the family member/significant other has been provided the following suicide prevention education, prior to the and/or following the discharge of the patient.  LCSW received written consent to speak with pr's father who reported pt can not live with him, who believes she is homeless and he has not seen her in person for months. Father reported that he Skypes with her on occasion. Father asked for LCSW to share with pt,  I love her and that I am praying for her  The suicide prevention education provided includes the following: Suicide risk factors Suicide prevention and interventions National Suicide Hotline telephone number [facility] assessment telephone number [facility] Emergency Assistance 911 [facility] and/or Residential Mobile Crisis Unit telephone number  Request made of family/significant other to: Remove weapons (e.g., guns, rifles, knives), all items previously/currently identified as safety concern.   Remove drugs/medications (over-the-counter, prescriptions, illicit drugs), all items previously/currently identified as a safety concern.  The family member/significant other verbalizes understanding of the suicide prevention education information provided.  The family member/significant other agrees to remove the items of safety concern listed above.  Melinda Jimenez R 08/14/2024, 5:08 PM

## 2024-08-14 NOTE — Progress Notes (Signed)
(  Sleep Hours) - 4.25 (Any PRNs that were needed, meds refused, or side effects to meds)- none (Any disturbances and when (visitation, over night)- pt was boisterous and irritable on admission to her roommate; pt moved to quiet room for the night - states she was just talking to herself and not arguing with her roommate (Concerns raised by the patient)- states she wants her own room on unit (SI/HI/AVH)- passive SI on admission, contracts for safety; denies HI, AVH

## 2024-08-14 NOTE — Plan of Care (Signed)
   Problem: Education: Goal: Emotional status will improve Outcome: Progressing Goal: Mental status will improve Outcome: Progressing   Problem: Activity: Goal: Sleeping patterns will improve Outcome: Progressing

## 2024-08-14 NOTE — BHH Suicide Risk Assessment (Signed)
 Suicide Risk Assessment  Admission Assessment    [facility] Admission Suicide Risk Assessment   Nursing information obtained from:  Patient Demographic factors:  Low socioeconomic status, Unemployed Current Mental Status:  Suicidal ideation indicated by patient Loss Factors:  Financial problems / change in socioeconomic status Historical Factors:  Impulsivity Risk Reduction Factors:  NA  Total Time spent with patient: 45 minutes Principal Problem: Major depressive disorder Diagnosis:  Principal Problem:   Major depressive disorder Active Problems:   Major depressive disorder, recurrent episode, severe (HCC)  Subjective Data: Melinda Jimenez is a 38 year old African-American female who was recently discharged from [facility] on 07/30/2024.  Present with past psychiatric history significant for major depressive disorder, recurrent, episode severe generalized anxiety, disorder bipolar 1 disorder severe, borderline personality disorder.PMHx include hypertension, prediabetes, and vitamin D  deficiency.  Patient presents voluntarily to [facility] from [facility] behavioral health urgent care for worsening depression resulting in suicidal ideation without any plans or intent in the context of psychosocial stressors.   Continued Clinical Symptoms:  Alcohol Use Disorder Identification Test Final Score (AUDIT): 0 The Alcohol Use Disorders Identification Test, Guidelines for Use in Primary Care, Second Edition.  World Science Writer [facility]). Score between 0-7:  no or low risk or alcohol related problems. Score between 8-15:  moderate risk of alcohol related problems. Score between 16-19:  high risk of alcohol related problems. Score 20 or above:  warrants further diagnostic evaluation for alcohol dependence and treatment.  CLINICAL FACTORS:   Severe Anxiety and/or Agitation Bipolar Disorder:   Mixed State Depression:    Aggression Anhedonia Delusional Impulsivity Severe More than one psychiatric diagnosis Unstable or Poor Therapeutic Relationship Previous Psychiatric Diagnoses and Treatments Medical Diagnoses and Treatments/Surgeries  Musculoskeletal: Strength & Muscle Tone: within normal limits Gait & Station: normal Patient leans: N/A  Psychiatric Specialty Exam:  Presentation  General Appearance:  Casual  Eye Contact: -- (Intense staring at provider)  Speech: Garbled  Speech Volume: Normal  Handedness: Right  Mood and Affect  Mood: Irritable  Affect: Congruent  Thought Process  Thought Processes: Coherent  Descriptions of Associations:Intact  Orientation:Full (Time, Place and Person)  Thought Content:WDL  History of Schizophrenia/Schizoaffective disorder:No  Duration of Psychotic Symptoms:Less than six months  Hallucinations:Hallucinations: None  Ideas of Reference:None  Suicidal Thoughts:Suicidal Thoughts: No SI Active Intent and/or Plan: -- (N/A) SI Passive Intent and/or Plan: -- (N/A)  Homicidal Thoughts:Homicidal Thoughts: No  Sensorium  Memory: Immediate Good  Judgment: Poor  Insight: Poor  Executive Functions  Concentration: Fair  Attention Span: Fair  Recall: Fair  Fund of Knowledge: Fair  Language: Fair  Psychomotor Activity  Psychomotor Activity: Psychomotor Activity: Normal  Assets  Assets: Communication Skills; Physical Health  Sleep  Sleep: Sleep: Fair Number of Hours of Sleep: 4.25  Physical Exam: Physical Exam Vitals reviewed.  Constitutional:      General: She is not in acute distress.    Appearance: She is obese. She is not ill-appearing.  HENT:     Head: Normocephalic.     Right Ear: External ear normal.     Left Ear: External ear normal.     Nose: Nose normal.     Mouth/Throat:     Pharynx: Oropharynx is clear.  Eyes:     Extraocular Movements: Extraocular movements intact.  Cardiovascular:      Rate and Rhythm: Normal rate.     Pulses: Normal pulses.  Pulmonary:     Effort: Pulmonary effort is normal.  Musculoskeletal:  General: Normal range of motion.     Cervical back: Normal range of motion.  Skin:    General: Skin is dry.  Neurological:     General: No focal deficit present.     Mental Status: She is oriented to person, place, and time.  Psychiatric:     Comments: Irritable    Review of Systems  Constitutional:  Negative for chills and fever.  HENT:  Negative for sore throat.   Eyes:  Negative for blurred vision.  Respiratory:  Negative for wheezing.   Cardiovascular:  Negative for chest pain and palpitations.  Gastrointestinal:  Negative for abdominal pain, constipation, diarrhea, heartburn, nausea and vomiting.  Genitourinary:  Negative for dysuria.  Musculoskeletal:  Negative for falls.  Skin:  Negative for itching and rash.  Neurological:  Negative for dizziness and headaches.  Endo/Heme/Allergies: Negative.   Psychiatric/Behavioral:  Positive for depression. Negative for hallucinations, substance abuse and suicidal ideas. The patient is nervous/anxious and has insomnia.    Blood pressure (!) 128/99, pulse 87, temperature 98 F (36.7 C), temperature source Oral, height 5' 7 (1.702 m), weight 100.9 kg, SpO2 100%. Body mass index is 34.83 kg/m.  COGNITIVE FEATURES THAT CONTRIBUTE TO RISK:  Polarized thinking    SUICIDE RISK:   Moderate:  Frequent suicidal ideation with limited intensity, and duration, some specificity in terms of plans, no associated intent, good self-control, limited dysphoria/symptomatology, some risk factors present, and identifiable protective factors, including available and accessible social support.  PLAN OF CARE: Treatment Plan Summary: Daily contact with patient to assess and evaluate symptoms and progress in treatment and Medication management   Observation Level/Precautions:  15 minute checks  Laboratory:   CBC: All  values of normal Chemistry Profile: Glucose 108, Total protein 8.9, otherwise normal Folic Acid: N/A GGT: N/A HbAIC: 6.0 HCG: Negative UDS: Negative for substances TSH: UA: Vitamin B-12: N/A Lipid profile: HDL 35 low, otherwise normal   EKG: NSR, ventricular rate 85, QT/QTc 362/430  Psychotherapy: Therapeutic milieu  Medications: See MAR  Consultations: N/A  Discharge Concerns: Safety  Estimated LOS: 3 to 7 days  Other:      Assessment: Melinda Jimenez is a 38 year old African-American female who was recently discharged from [facility] on 07/30/2024.  Present with past psychiatric history significant for major depressive disorder, recurrent, episode severe generalized anxiety, disorder bipolar 1 disorder severe, borderline personality disorder.PMHx include hypertension, prediabetes, and vitamin D  deficiency.  Patient presents voluntarily to [facility] from [facility] behavioral health urgent care for worsening depression resulting in suicidal ideation without any plans or intent in the context of psychosocial stressors.    Physician Treatment Plan for Primary Diagnosis: Major depressive disorder   Plans: Medications: --Resume Prozac  10 mg tablet p.o. daily bedtime for depression --Resume Abilify  5 mg p.o. daily at bedtime for depression and paranoia --Continue hydroxyzine  25 mg tablet p.o. 3 times daily as needed for anxiety --Continue trazodone  tablet 50 mg p.o. q. nightly as needed for insomnia    Medications for other medical conditions: Resume home medication -- Nifedipine  30 mg  24-hour tablets p.o. daily for high blood pressure --Initiate Iron tablet 25 mg p.o. twice daily for low hemoglobin --Initiate Colace 100 mg p.o. twice daily prophylaxis for constipation   Continue BH Agitation Protocol    Other PRN Medications  -Acetaminophen  650 mg every 6 as needed/mild pain  -Maalox 30 mL oral every 4 as needed/digestion  -Magnesium  hydroxide 30 mL  daily as needed/mild constipation    --  The risks/benefits/side-effects/alternatives to this medication were discussed in detail with the patient and time was given for questions. The patient consents to medication trial.   -- Metabolic profile and EKG monitoring obtained while on an atypical antipsychotic (BMI: Lipid Panel: HbgA1c: QTc:)   -- Encouraged patient to participate in unit milieu and in scheduled group therapies    Safety and Monitoring:  Voluntary admission to inpatient psychiatric unit for safety, stabilization and treatment  Daily contact with patient to assess and evaluate symptoms and progress in treatment  Patient's case to be discussed in multi-disciplinary team meeting  Observation Level : q15 minute checks  Vital signs: q12 hours  Precautions: suicide, but pt currently verbally contracts for safety on unit?    Discharge Planning:  Social work and case management to assist with discharge planning and identification of hospital follow-up needs prior to discharge  Estimated LOS: 5-7?days  Discharge Concerns: Need to establish Safety plan; Medication compliance and effectiveness  Discharge Goals: Return home with outpatient referrals for mental health follow-up including medication management/psychotherapy.    Long Term Goal(s): Improvement in symptoms so as ready for discharge   Short Term Goals: Ability to identify changes in lifestyle to reduce recurrence of condition will improve, Ability to verbalize feelings will improve, Ability to disclose and discuss suicidal ideas, Ability to demonstrate self-control will improve, Ability to identify and develop effective coping behaviors will improve, Ability to maintain clinical measurements within normal limits will improve, Compliance with prescribed medications will improve, and Ability to identify triggers associated with substance abuse/mental health issues will improve   Physician Treatment Plan for Secondary Diagnosis:  Principal Problem:   Major depressive disorder Active Problems:   Major depressive disorder, recurrent episode, severe (HCC)   I certify that inpatient services furnished can reasonably be expected to improve the patient's condition.   Ellouise JAYSON Azure, FNP 08/14/2024, 10:55 AM

## 2024-08-14 NOTE — Plan of Care (Signed)
  Problem: Physical Regulation: Goal: Ability to maintain clinical measurements within normal limits will improve Outcome: Progressing   Problem: Health Behavior/Discharge Planning: Goal: Compliance with treatment plan for underlying cause of condition will improve Outcome: Progressing   Problem: Coping: Goal: Ability to verbalize frustrations and anger appropriately will improve Outcome: Progressing

## 2024-08-14 NOTE — Progress Notes (Addendum)
   08/14/24 2000  Psych Admission Type (Psych Patients Only)  Admission Status Voluntary  Psychosocial Assessment  Patient Complaints Irritability;Suspiciousness  Eye Contact Fair  Facial Expression Anxious  Affect Irritable  Speech Logical/coherent;Argumentative  Interaction Assertive;Needy  Motor Activity Other (Comment) (q15 min safety checks)  Appearance/Hygiene Unremarkable  Mood Suspicious;Irritable  Thought Process  Coherency Circumstantial  Content Blaming others  Delusions None reported or observed  Perception WDL  Hallucination None reported or observed  Judgment Limited  Confusion None  Danger to Self  Current suicidal ideation? Denies  Agreement Not to Harm Self Yes  Description of Agreement verbal  Danger to Others  Danger to Others Reported or observed  Danger to Others Abnormal  Harmful Behavior to others No threats or harm toward other people (nonspecific HI towards no one in particular)  Destructive Behavior No threats or harm toward property   Progress note   D: Pt seen in hallway having her VS assessed. Pt denies SI, AVH. States she has non-specific HI towards no one in particular. These people getting on my nerves. Pt rates pain  0/10. Pt rates anxiety  0/10 and depression  0/10. Per shift report, pt was suspicious and paranoid about her medications and refused to take them this evening. When this was presented to pt, she stated, I was gonna take them but she (the nurse) left. I will take them now. Pt at med window arguing about the medications. Why is the iron pill black? I don't remember seeing a black pill. And, y'all giving me Prozac  and Abilify . Y'all trying to kill me. And, a stool softener. I'm probably not supposed to be taking all of this at one time. Pt educated on pharmacy's role in preventing drug interactions and that medications are safe. Pt still suspicious but took medications except Colace. Did not offer the BP medication that was  previously refused because it would be too close to next administration. BP wnl. Pt went back to her room. No other concerns noted at this time.  A: Pt provided support and encouragement. Pt given scheduled medication as prescribed. PRNs as appropriate. Q15 min checks for safety.   R: Pt safe on the unit. Will continue to monitor.

## 2024-08-14 NOTE — Group Note (Signed)
 Recreation Therapy Group Note   Group Topic:Problem Solving  Group Date: 08/14/2024 Start Time: 0945 End Time: 1016 Facilitators: Braidyn Peace-McCall, LRT,CTRS Location: [address] Dayroom   Group Topic: Communication, Team Building, Problem Solving  Goal Area(s) Addresses:  Patient will effectively work with peer towards shared goal.  Patient will identify skills used to make activity successful.  Patient will identify how skills used during activity can be used to reach post d/c goals.   Behavioral Response:   Intervention: STEM Activity  Activity: Straw Bridge. In teams of 3-5, patients were given 15 plastic drinking straws and an equal length of masking tape. Using the materials provided, patients were instructed to build a free standing bridge-like structure to suspend an everyday item (ex: puzzle box) off of the floor or table surface. All materials were required to be used by the team in their design. LRT facilitated post-activity discussion reviewing team process. Patients were encouraged to reflect how the skills used in this activity can be generalized to daily life post discharge.   Education: Pharmacist, Community, Scientist, Physiological, Discharge Planning   Education Outcome: Acknowledges education/In group clarification offered/Needs additional education.    Affect/Mood: N/A   Participation Level: Did not attend    Clinical Observations/Individualized Feedback:      Plan: Continue to engage patient in RT group sessions 2-3x/week.   Alazae Crymes-McCall, LRT,CTRS 08/14/2024 12:58 PM

## 2024-08-14 NOTE — Progress Notes (Signed)
 Patient ID: JONTE WOLLAM, female   DOB: [birth year], 38 y.o.   MRN: [id number]  D: Pt here voluntarily from [facility]. Pt denies HI/AVH and pain at this time. Passive SI with a plan to slit her wrists. Pt contracts for safety. Pt is guarded and not forthcoming with much information. Pt was at [facility] in June 2025. Pt states, I know how this goes. I know everything about being here. Pt says she is tired and just wants to sleep. Signs paperwork and answers a few questions.  Pt asking why she cannot have all of her belongings on the unit. Explained policies to pt. Argumentative at times but boundaries set. Did not answer questions about support system or transportation. Pt unemployed. Denies alcohol, drug and tobacco use. I just want to go to bed.  A: Pt was offered support and encouragement during assessment. VS assessed and admission paperwork signed. Belongings searched and contraband items placed in locker. Non-invasive skin search completed: tattoos on bilateral arms and upper back. Adult acne on back. Pt offered food and drink and drink accepted. Pt states she was nauseous earlier and vomiting and can't keep any food down now. Pt introduced to unit milieu by nursing staff. Q 15 minute checks were started for safety.   R: Pt in room. Pt safety maintained on unit.

## 2024-08-15 DIAGNOSIS — F603 Borderline personality disorder: Secondary | ICD-10-CM

## 2024-08-15 NOTE — Group Note (Signed)
 [facility] LCSW Group Therapy Note    Group Date: 08/15/2024 Start Time: 1000 End Time: 1100  Type of Therapy and Topic:  Group Therapy:  Overcoming Obstacles  Participation Level:  [facility] PARTICIPATION LEVEL: Did Not Attend  Mood:  Description of Group:   In this group patients will be encouraged to explore what they see as obstacles to their own wellness and recovery. They will be guided to discuss their thoughts, feelings, and behaviors related to these obstacles. The group will process together ways to cope with barriers, with attention given to specific choices patients can make. Each patient will be challenged to identify changes they are motivated to make in order to overcome their obstacles. This group will be process-oriented, with patients participating in exploration of their own experiences as well as giving and receiving support and challenge from other group members.  Therapeutic Goals: 1. Patient will identify personal and current obstacles as they relate to admission. 2. Patient will identify barriers that currently interfere with their wellness or overcoming obstacles.  3. Patient will identify feelings, thought process and behaviors related to these barriers. 4. Patient will identify two changes they are willing to make to overcome these obstacles:    Summary of Patient Progress   NA   Therapeutic Modalities:   Cognitive Behavioral Therapy Solution Focused Therapy Motivational Interviewing Relapse Prevention Therapy   Jenise Iannelli O Keaundre Thelin, LCSWA

## 2024-08-15 NOTE — Plan of Care (Signed)
  Problem: Education: Goal: Emotional status will improve Outcome: Progressing Goal: Mental status will improve Outcome: Progressing   Problem: Activity: Goal: Sleeping patterns will improve Outcome: Progressing   Problem: Coping: Goal: Ability to demonstrate self-control will improve Outcome: Progressing   Problem: Safety: Goal: Periods of time without injury will increase Outcome: Progressing

## 2024-08-15 NOTE — Group Note (Signed)
 Date:  08/15/2024 Time:  11:03 AM  Group Topic/Focus: Social work To help members understand different types of therapy, compare their benefits, and choose approaches that best meet their mental health needs.      Participation Level:  Active   Melinda Jimenez [name] 08/15/2024, 11:03 AM

## 2024-08-15 NOTE — Group Note (Signed)
 Date:  08/15/2024 Time:  4:23 PM  Group Topic/Focus:  Overcoming Stress:   The focus of this group is to define stress and help patients assess their triggers.    Participation Level:  Did Not Attend    Annalee Larch 08/15/2024, 4:23 PM

## 2024-08-15 NOTE — Group Note (Signed)
 Date:  08/15/2024 Time:  9:10 PM  Group Topic/Focus:  Wrap-Up Group:   The focus of this group is to help patients review their daily goal of treatment and discuss progress on daily workbooks.    Participation Level:  Active  Participation Quality:  Appropriate and Sharing  Affect:  Appropriate  Cognitive:  Appropriate  Insight: Appropriate  Engagement in Group:  Engaged  Modes of Intervention:  Activity and Socialization  Additional Comments:  Patient activitly enaged in wrap up group and participated in the group activity.  Melinda Jimenez 08/15/2024, 9:10 PM

## 2024-08-15 NOTE — Progress Notes (Addendum)
 pt reports that she was unable to sleep last night due to taking medications too late  reports some VH.   declined meds this am until she talks with provider.    Provider notified.  Pt returned to staff and stated  Just to clarify I wasn't having hallucinations,  I just didn't sleep well and was half way between dreaming and awake.
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
## 2024-08-15 NOTE — Plan of Care (Signed)
  Problem: Education: Goal: Emotional status will improve Outcome: Progressing   Problem: Education: Goal: Mental status will improve Outcome: Progressing   Problem: Activity: Goal: Sleeping patterns will improve Outcome: Progressing   Problem: Health Behavior/Discharge Planning: Goal: Compliance with treatment plan for underlying cause of condition will improve Outcome: Progressing   Problem: Education: Goal: Ability to make informed decisions regarding treatment will improve Outcome: Progressing   Problem: Medication: Goal: Compliance with prescribed medication regimen will improve Outcome: Progressing   Problem: Medication: Goal: Compliance with prescribed medication regimen will improve Outcome: Progressing   Problem: Self-Concept: Goal: Ability to disclose and discuss suicidal ideas will improve Outcome: Progressing

## 2024-08-15 NOTE — Progress Notes (Signed)
   08/15/24 1024  Psych Admission Type (Psych Patients Only)  Admission Status Voluntary  Psychosocial Assessment  Patient Complaints Irritability;Restlessness  Eye Contact Fair  Facial Expression Anxious  Affect Labile  Speech Logical/coherent  Interaction Assertive  Motor Activity Other (Comment) (WNL)  Appearance/Hygiene Unremarkable  Behavior Characteristics Agitated;Guarded  Mood Labile  Thought Process  Coherency Circumstantial  Content Blaming others (pt reports not getting woke up for breakfast)  Perception WDL  Hallucination None reported or observed  Judgment Limited  Confusion None  Danger to Self  Current suicidal ideation? Denies  Agreement Not to Harm Self Yes  Description of Agreement verbal  Danger to Others  Danger to Others None reported or observed  Danger to Others Abnormal  Harmful Behavior to others No threats or harm toward other people  Destructive Behavior No threats or harm toward property   Pt alert and oriented.  Visible in milieu.  Pt has flat affect and labile mood.  Pt has selectively taken medications.  Provider aware.  Pt has an order for her journal from locker but due to having wire binding was not able to have this.

## 2024-08-15 NOTE — Group Note (Signed)
 Date:  08/15/2024 Time:  10:34 AM  Group Topic/Focus: Social wellness and orientation goals Group Goals Group:   The focus of this group is to help patients establish daily goals to achieve during treatment and discuss how the patient can incorporate goal setting into their daily lives to aide in recovery. Orientation:   The focus of this group is to educate the patient on the purpose and policies of crisis stabilization and provide a format to answer questions about their admission.  The group details unit policies and expectations of patients while admitted.    Participation Level:  Minimal  Participation Quality:  Appropriate  Affect:  Appropriate  Cognitive:  Alert and Appropriate  Insight: Appropriate  Engagement in Group:  Engaged  Modes of Intervention:  Discussion and Orientation  Additional Comments:  Patient attend  Melinda Jimenez 08/15/2024, 10:34 AM

## 2024-08-15 NOTE — Progress Notes (Signed)
(  Sleep Hours) - 5.25 (Any PRNs that were needed, meds refused, or side effects to meds)- none (Any disturbances and when (visitation, over night)- none (Concerns raised by the patient)- refused medications during day shift; said she believed we were trying to kill her with all the medicines (SI/HI/AVH)- denies

## 2024-08-16 MED ORDER — ARIPIPRAZOLE 5 MG PO TABS
5.0000 mg | ORAL_TABLET | Freq: Two times a day (BID) | ORAL | Status: DC
  Administered 2024-08-17: 5 mg via ORAL
  Filled 2024-08-16: qty 1

## 2024-08-16 MED ORDER — TRAZODONE HCL 100 MG PO TABS
100.0000 mg | ORAL_TABLET | Freq: Every day | ORAL | Status: DC
  Filled 2024-08-16: qty 1

## 2024-08-16 MED ORDER — VITAMIN D (ERGOCALCIFEROL) 1.25 MG (50000 UNIT) PO CAPS: 50000.0000 [IU] | ORAL_CAPSULE | ORAL | Status: DC

## 2024-08-16 NOTE — Plan of Care (Signed)
   Problem: Education: Goal: Emotional status will improve Outcome: Not Progressing Goal: Mental status will improve Outcome: Not Progressing

## 2024-08-16 NOTE — Group Note (Signed)
 Date:  08/16/2024 Time:  9:21 AM  Group Topic/Focus:  Goals Group:   The focus of this group is to help patients establish daily goals to achieve during treatment and discuss how the patient can incorporate goal setting into their daily lives to aide in recovery.    Participation Level:  Did Not Attend   Custer Pimenta A Kelli Egolf 08/16/2024, 9:21 AM

## 2024-08-16 NOTE — Plan of Care (Signed)
   Problem: Education: Goal: Knowledge of [facility] Education information/materials will improve Outcome: Progressing Goal: Emotional status will improve Outcome: Progressing Goal: Mental status will improve Outcome: Progressing Goal: Verbalization of understanding the information provided will improve Outcome: Progressing

## 2024-08-16 NOTE — Group Note (Signed)
 Date:  08/16/2024 Time:  6:50 PM   Group Topic/Focus:  Group used a non- competitive card game to build mindful listening skills, and encourage acceptance and understanding of oneself and peers. Patients cooperate and share, fostering empathy and personal growth in a supportive environment.      Participation Level:  Active  Participation Quality:  Appropriate and Attentive  Affect:  Appropriate  Cognitive:  Alert and Appropriate  Insight: Appropriate, Good, and Improving  Engagement in Group:  Engaged and Supportive  Modes of Intervention:  Activity, Discussion, Exploration, Rapport Building, Socialization, and Support    Melinda Jimenez 08/16/2024, 6:50 PM
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
## 2024-08-16 NOTE — Progress Notes (Signed)
   08/16/24 1500  Psych Admission Type (Psych Patients Only)  Admission Status Voluntary  Psychosocial Assessment  Patient Complaints Isolation  Eye Contact Fair  Facial Expression Flat  Affect Preoccupied  Speech Soft  Interaction Cautious  Motor Activity Slow  Appearance/Hygiene Unremarkable  Behavior Characteristics Guarded  Mood Labile  Thought Process  Coherency WDL  Content WDL  Delusions None reported or observed  Perception WDL  Hallucination None reported or observed  Judgment Limited  Confusion None  Danger to Self  Current suicidal ideation? Denies  Description of Suicide Plan No Plan  Self-Injurious Behavior No self-injurious ideation or behavior indicators observed or expressed   Agreement Not to Harm Self Yes  Description of Agreement Verbal  Danger to Others  Danger to Others None reported or observed

## 2024-08-16 NOTE — BH Assessment (Signed)
(  Sleep Hours) - 3.5 (Any PRNs that were needed, meds refused, or side effects to meds)-  (Any disturbances and when (visitation, over night)- None (Concerns raised by the patient)- None (SI/HI/AVH)- Denies

## 2024-08-16 NOTE — Group Note (Signed)
 Date:  08/16/2024 Time:  9:11 PM  Group Topic/Focus:  Wrap-Up Group:   The focus of this group is to help patients review their daily goal of treatment and discuss progress on daily workbooks.    Participation Level:  Did Not Attend  Participation Quality:  N/A  Affect:  N/A  Cognitive:  N/A  Insight: None  Engagement in Group:  N/A  Modes of Intervention:  N/A  Additional Comments:  Patient was encouraged but did not attend  Melinda Jimenez 08/16/2024, 9:11 PM

## 2024-08-16 NOTE — [facility] Group Notes (Signed)
 Melinda Jimenez did not attend wrap up group

## 2024-08-17 MED ORDER — IBUPROFEN 600 MG PO TABS: 600.0000 mg | ORAL_TABLET | Freq: Four times a day (QID) | ORAL | Status: DC | PRN

## 2024-08-17 MED ORDER — ARIPIPRAZOLE 5 MG PO TABS
5.0000 mg | ORAL_TABLET | Freq: Once | ORAL | Status: AC
  Administered 2024-08-17: 5 mg via ORAL
  Filled 2024-08-17: qty 1

## 2024-08-17 MED ORDER — ARIPIPRAZOLE 10 MG PO TABS
10.0000 mg | ORAL_TABLET | Freq: Every day | ORAL | Status: DC
  Administered 2024-08-19: 10 mg via ORAL
  Filled 2024-08-17: qty 1

## 2024-08-17 NOTE — Group Note (Signed)
 Date:  08/17/2024 Time:  10:17 AM  Group Topic/Focus: Goals group orientation Goals Group:   The focus of this group is to help patients establish daily goals to achieve during treatment and discuss how the patient can incorporate goal setting into their daily lives to aide in recovery.    Participation Level:  Did Not Attend  Melinda Jimenez 08/17/2024, 10:17 AM

## 2024-08-17 NOTE — BH Assessment (Signed)
(  Sleep Hours) - 6 (Any PRNs that were needed, meds refused, or side effects to meds)-  (Any disturbances and when (visitation, over night)- None (Concerns raised by the patient)- None (SI/HI/AVH)- Denies

## 2024-08-17 NOTE — Plan of Care (Signed)
   Problem: Education: Goal: Knowledge of [facility] Education information/materials will improve Outcome: Progressing Goal: Emotional status will improve Outcome: Progressing Goal: Mental status will improve Outcome: Progressing Goal: Verbalization of understanding the information provided will improve Outcome: Progressing

## 2024-08-17 NOTE — Group Note (Signed)
 Date:  08/17/2024 Time:  11:27 AM  Group Topic/Focus: Emotional wellness Emotional Education:   The focus of this group is to discuss what feelings/emotions are, and how they are experienced.    Participation Level:  Did Not Attend   Melinda Jimenez 08/17/2024, 11:27 AM

## 2024-08-17 NOTE — Progress Notes (Signed)
   08/17/24 1100  Psych Admission Type (Psych Patients Only)  Admission Status Voluntary  Psychosocial Assessment  Patient Complaints Isolation  Eye Contact Fair  Facial Expression Flat  Affect Preoccupied  Speech Logical/coherent  Interaction Guarded  Motor Activity Other (Comment) (WNL)  Appearance/Hygiene Unremarkable  Behavior Characteristics Unwilling to participate;Guarded  Mood Preoccupied  Thought Process  Coherency WDL  Content WDL  Delusions None reported or observed  Perception WDL  Hallucination None reported or observed  Judgment Limited  Confusion None  Danger to Self  Current suicidal ideation? Denies  Self-Injurious Behavior No self-injurious ideation or behavior indicators observed or expressed   Agreement Not to Harm Self Yes  Description of Agreement verbal  Danger to Others  Danger to Others None reported or observed  Danger to Others Abnormal  Harmful Behavior to others No threats or harm toward other people  Destructive Behavior No threats or harm toward property

## 2024-08-17 NOTE — Group Note (Signed)
 Date:  08/17/2024 Time:  1:39 PM  Group Topic/Focus: Physical wellness Physical wellness and mental health are deeply connected. When you take care of your body, you support your brain's ability to manage stress, regulate emotions, and function more effectively. Here's how physical wellness boosts mental well-being--and how to start improving both:      Participation Level:  Did Not Attend   Melinda Jimenez 08/17/2024, 1:39 PM

## 2024-08-17 NOTE — Progress Notes (Signed)
 At beginning of shift, pt in her room reading. This nurse went to introduce myself and see if patient needed anything. Pt was very agitated that I disturbed her. Pt had very short, aggressive answers. I informed pt I would start giving medication at 2100. She stated I already had all my medication today. I ask if she wanted something for sleep, pt stated Dont you people see in the computer when someone gets medication? Do you just put medications in there, so you can give them out? Patient informed, only Drs can order medications and yes we can see when a medication was given. Pt refused ordered trazodone .

## 2024-08-17 NOTE — Plan of Care (Signed)
   Problem: Education: Goal: Knowledge of [place] [facility] Education information/materials will improve Outcome: Progressing Goal: Emotional status will improve Outcome: Progressing Goal: Mental status will improve Outcome: Progressing

## 2024-08-17 NOTE — Plan of Care (Signed)

## 2024-08-17 NOTE — Group Note (Signed)
 Date:  08/17/2024 Time:  2:05 PM  Group Topic/Focus: [place] Spirituality:   The focus of this group is to discuss how one's spirituality can aide in recovery.    Participation Level:  Did Not Attend   Dolores CHRISTELLA Fredericks 08/17/2024, 2:05 PM

## 2024-08-17 NOTE — Progress Notes (Incomplete)
 [facility] MD Progress Note  08/17/2024 9:48 AM Melinda Jimenez  MRN:  [id number]  Principal Problem: Bipolar affective disorder, depressed, severe (HCC) Diagnosis: Principal Problem:   Bipolar affective disorder, depressed, severe (HCC) Active Problems:   GAD (generalized anxiety disorder)   PTSD (post-traumatic stress disorder)  Reason for Admission:  Melinda Jimenez is a 38 y.o. female  with a past psychiatric history of MDD, GAD, bipolar 1, borderline personality disorder. Patient initially arrived to [facility] on 11/12 for increased suicidal thoughts, and admitted to [facility] on 11/14 for acute safety concerns. PMHx is significant for HTN, prediabetes, and vitamin D  deficiency.  (admitted on 08/13/2024, total  LOS: 4 days )  Chart review: Overnight Events Vital signs: WNL MAR was reviewed and patient was compliant with psychotropic medications. Patient is continuing to refuse nifedipine  and nicotine patch. Patient received no PRNs.  Labs notable for microcytic anemia, vitamin D  level low at 11.48 on 10/30. Sleep: 3.5 hours per nursing flow sheets Nursing notes: Has attended some unit group sessions, but most of not being attended.  Today's patient assessment:.  During encounter today, patient was observed prior to entering into her room, to be resting in bed in the dark. Mood observed to be depressed, affect flat and congruent.  Patient denies SI, denies HI, denies AVH, denies delusional thinking, denies first rank symptoms.  There are no signs of psychosis during entire encounter.   Patient however, seems to be minimizing her symptoms, persistently asks to be discharged, even though she states that she does not have anywhere to go.  Writer asked for patient to provide the phone number of her family member so that a family member can be called for safety planning, but patient states that she has no one.  Writer asked where she will go if discharged, she states I will figure it out.  Writer  encouraged patient to wait for CSW to talk with her to possibly look up some shelters to which she could go such as [facility] in [place], [place].  Patient is receptive to this, and clinical research associate will also share this information with CSW.  Patient denies medication related side effects, reports that she is sleeping through the night, even though staff is recording only 3.5 hours last night.  She reports a good appetite, reports that she is eating well for all meals. Based on objective information, medication adjustments for today will be as follows: - Increase Abilify  to 5 mg twice daily for mood stabilization (pt has had frequent periods of irritability on the unit, and mood is currently unstable for discharge). No TD/EPS type symptoms found on assessment, and pt denies any feelings of stiffness. AIMS: 0.  - Increase trazodone  to 100 mg nightly for sleep. -Start Vitamin D  50.000 units weekly for low D levels as low levels might be contributory to depressive symptoms. Continue other medications as listed below.  Patient will benefit from continuous hospitalization at this time as we continue to adjust medications in an effort to stabilize symptoms prior to discharge.  Labs Review: repeating CBC due to WBCs being elevated on 11/12. Vitamin D  level was 11.48 on 10/30, will order vitamin D  50000 units weekly for supplementation. EKG with Qtc 430.  Past Medical History:  Past Medical History:  Diagnosis Date   Anemia    Anxiety    off meds with preg   Bipolar 1 disorder (HCC) 03/10/2024   Borderline personality disorder (HCC) 03/10/2024   Brief psychotic disorder (HCC) 04/03/2020  Carpal tunnel syndrome    Cesarean delivery delivered 10/10/2020   Chlamydia    Chronic hypertension    Chronic post-traumatic stress disorder (PTSD) 03/10/2024   COVID-19 09/12/2020   Genital HSV    Last OB: > 58yr ago; Valtrex  500 mg po prn outbreaks   GERD (gastroesophageal reflux disease)    GERD without  esophagitis 01/20/2013   Gestational diabetes    diet controlled   Goiter, unspecified 04/18/2010   Centricity Description: THYROMEGALY  Qualifier: Diagnosis of   By: Lelon RIGGERS, Scott      Centricity Description: GOITER  Qualifier: Diagnosis of   By: Lelon RIGGERS, Scott      Replacing diagnoses that were inactivated after the 12/31/22 regulatory import     H/O: cesarean section 09/27/2016   last C-section for active HSV, C-section X1: 05/20/13; records in [facility]  TOLAC desired, signed 09/27/16      Headache(784.0)    Herpes genitalis in women 04/18/2010   Qualifier: Diagnosis of   By: Lelon RIGGERS, Scott         IBS (irritable bowel syndrome)    Infection    UTI   Intrauterine pregnancy 03/22/2020   EDC 11/14/2020     Panic attack    Premature rupture of membranes (PROM), onset of labor after 24 hours, antepartum, preterm, third trimester 10/10/2020   Pulmonary embolus (HCC) 10/18/2020   Thyroid  disease    Trichomonas    Vitamin D  deficiency 03/10/2024   Family History:  Family History  Problem Relation Age of Onset   Depression Mother    Anxiety disorder Mother    Breast cancer Other    Bipolar disorder Brother    Diabetes Maternal Grandmother    Cancer Maternal Grandmother    Hypertension Maternal Grandmother    Hypertension Paternal Grandmother    Autism Other    Schizophrenia Other     Current Medications: Current Facility-Administered Medications  Medication Dose Route Frequency Provider Last Rate Last Admin   acetaminophen  (TYLENOL ) tablet 650 mg  650 mg Oral Q6H PRN Marry Clamp, MD       alum & mag hydroxide-simeth (MAALOX/MYLANTA) 200-200-20 MG/5ML suspension 30 mL  30 mL Oral Q4H PRN Marry Clamp, MD       ARIPiprazole  (ABILIFY ) tablet 5 mg  5 mg Oral BID Tex Drilling, NP   5 mg at 08/17/24 9192   haloperidol  (HALDOL ) tablet 5 mg  5 mg Oral TID PRN Marry Clamp, MD       And   diphenhydrAMINE  (BENADRYL ) capsule 50 mg  50 mg Oral TID PRN Marry Clamp, MD       haloperidol  lactate (HALDOL ) injection 5 mg  5 mg Intramuscular TID PRN Marry Clamp, MD       And   diphenhydrAMINE  (BENADRYL ) injection 50 mg  50 mg Intramuscular TID PRN Marry Clamp, MD       And   LORazepam  (ATIVAN ) injection 2 mg  2 mg Intramuscular TID PRN Marry Clamp, MD       haloperidol  lactate (HALDOL ) injection 10 mg  10 mg Intramuscular TID PRN Marry Clamp, MD       And   diphenhydrAMINE  (BENADRYL ) injection 50 mg  50 mg Intramuscular TID PRN Marry Clamp, MD       And   LORazepam  (ATIVAN ) injection 2 mg  2 mg Intramuscular TID PRN Marry Clamp, MD       ferrous sulfate  tablet 325 mg  325 mg Oral BID WC Ntuen, Ellouise  C, FNP   325 mg at 08/17/24 9192   hydrOXYzine  (ATARAX ) tablet 25 mg  25 mg Oral TID PRN Ntuen, Tina C, FNP       magnesium  hydroxide (MILK OF MAGNESIA) suspension 30 mL  30 mL Oral Daily PRN Marry Clamp, MD       nicotine (NICODERM CQ - dosed in mg/24 hours) patch 21 mg  21 mg Transdermal Q0600 Ntuen, Tina C, FNP       NIFEdipine  (PROCARDIA -XL/NIFEDICAL-XL) 24 hr tablet 30 mg  30 mg Oral Daily Ntuen, Tina C, FNP       traZODone  (DESYREL ) tablet 100 mg  100 mg Oral QHS Nkwenti, Doris, NP       Vitamin D  (Ergocalciferol ) (DRISDOL ) 1.25 MG (50000 UNIT) capsule 50,000 Units  50,000 Units Oral Q7 days Tex Drilling, NP        Lab Results: No results found for this or any previous visit (from the past 48 hours).  Blood Alcohol level:  Lab Results  Component Value Date   [facility] <15 03/09/2024   ETH <10 11/26/2022    Metabolic Labs: Lab Results  Component Value Date   HGBA1C 6.0 (H) 08/12/2024   MPG 125.5 08/12/2024   MPG 122.63 07/28/2024   No results found for: PROLACTIN Lab Results  Component Value Date   CHOL 126 08/12/2024   TRIG 93 08/12/2024   HDL 35 (L) 08/12/2024   CHOLHDL 3.6 08/12/2024   VLDL 19 08/12/2024   LDLCALC 72 08/12/2024   LDLCALC 71 07/29/2024    Sleep:Sleep: Poor    Physical  Findings: AIMS: No  CIWA:    COWS:     Psychiatric Specialty Exam:  Presentation  General Appearance: Fairly Groomed  Eye Contact: intense  Speech: clear and coherent  Speech Volume:Normal  Handedness:Right   Mood and Affect  Mood: initially irritable then later on stating less depressed   Affect:Congruent   Thought Process  Thought Processes:Coherent  Descriptions of Associations:Intact  Orientation:Full (Time, Place and Person)  Thought Content:Logical  History of Schizophrenia/Schizoaffective disorder:No  Duration of Psychotic Symptoms:Less than six months  Hallucinations:Hallucinations: None   Ideas of Reference:None  Suicidal Thoughts: No  Homicidal Thoughts:Homicidal Thoughts: No    Sensorium  Memory:Immediate Fair  Judgment:Fair  Insight:Fair   Executive Functions  Concentration:Fair  Attention Span:Fair  Recall:Fair  Fund of Knowledge:Fair  Language:Fair   Psychomotor Activity  Psychomotor Activity:Psychomotor Activity: Normal    Assets  Assets:Desire for Improvement   Sleep  Sleep:Sleep: Poor    Physical Exam Vitals and nursing note reviewed.  Eyes:     Pupils: Pupils are equal, round, and reactive to light.  Neurological:     General: No focal deficit present.     Mental Status: She is oriented to person, place, and time.    Review of Systems  Psychiatric/Behavioral:  Positive for depression, hallucinations and substance abuse. Negative for memory loss and suicidal ideas. The patient is nervous/anxious and has insomnia.   All other systems reviewed and are negative.   Physical Exam Constitutional:      Appearance: the patient is not toxic-appearing.  Pulmonary:     Effort: Pulmonary effort is normal.  Neurological:     General: No focal deficit present.     Mental Status: the patient is alert and oriented to person, place, and time.   Review of Systems  Respiratory:  Negative for shortness of breath.    Cardiovascular:  Negative for chest pain.  Gastrointestinal:  Negative for abdominal  pain, constipation, diarrhea, nausea and vomiting.  Neurological:  Negative for headaches.   Blood pressure 91/76, pulse 89, temperature 98.2 F (36.8 C), temperature source Oral, resp. rate 16, height 5' 7 (1.702 m), weight 100.9 kg, SpO2 100%. Body mass index is 34.83 kg/m.  Treatment Plan Summary: Daily contact with patient to assess and evaluate symptoms and progress in treatment and Medication management  Safety and Monitoring: Voluntary admission to inpatient psychiatric unit for safety, stabilization and treatment Daily contact with patient to assess and evaluate symptoms and progress in treatment Patient's case to be discussed in multi-disciplinary team meeting Observation Level : q15 minute checks Vital signs: q12 hours Precautions: Safety  Long Term Goal(s): Improvement in symptoms so as ready for discharge  Short Term Goals: Ability to identify changes in lifestyle to reduce recurrence of condition will improve, Ability to verbalize feelings will improve, Ability to disclose and discuss suicidal ideas, Ability to demonstrate self-control will improve, Ability to identify and develop effective coping behaviors will improve, Ability to maintain clinical measurements within normal limits will improve, Compliance with prescribed medications will improve, and Ability to identify triggers associated with substance abuse/mental health issues will improve  Diagnoses Principal Problem:   Bipolar affective disorder, depressed, severe (HCC) Active Problems:   GAD (generalized anxiety disorder)   PTSD (post-traumatic stress disorder)  Medications: - Increase Abilify  to 5 mg BID for mood stabilization starting 11/17 at 0800 -Increase Trazodone  from 50 mg PRN nightly to scheduled at 100 mg nightly for sleep starting at 2100 on 11/16] -Start Vit D 50,000 units weekly for low Vit D  levels -Discontinue Nicoderm Cq 21 mg due to consecutive refusals -Continue Nifedipine  XR 30 mg daily for htn  PRNS -Continue Tylenol  650 mg every 6 hours PRN for mild pain -Continue Maalox 30 mg every 4 hrs PRN for indigestion -Continue Milk of Magnesia as needed every 6 hrs for constipation  Discharge Planning: Social work and case management to assist with discharge planning and identification of hospital follow-up needs prior to discharge Estimated LOS: 5-7 days Discharge Concerns: Need to establish a safety plan; Medication compliance and effectiveness Discharge Goals: Return home with outpatient referrals for mental health follow-up including medication management/psychotherapy  I certify that inpatient services furnished can reasonably be expected to improve the patient's condition.   Total Time Spent in Direct Patient Care:   I personally spent 45 minutes on the unit in direct patient care. The direct patient care time included face-to-face time with the patient, reviewing the patient's chart, communicating with other professionals, and coordinating care. Greater than 50% of this time was spent in counseling or coordinating care with the patient regarding goals of hospitalization, psycho-education, and discharge planning needs.    Blair Chiquita Hint, NP 11/17/20259:48 AM     11/17/20259:48 AM Patient ID: Melinda Jimenez, female   DOB: [birth year], 38 y.o.   MRN: [id number] Patient ID: Melinda Jimenez, female   DOB: 07/24/1986, 38 y.o.   MRN: [id number]

## 2024-08-17 NOTE — Group Note (Signed)
 Recreation Therapy Group Note   Group Topic:Communication  Group Date: 08/17/2024 Start Time: 0945 End Time: 1020 Facilitators: Ariyan Brisendine-McCall, LRT,CTRS Location: [address] Dayroom   Group Topic: Communication, Problem Solving   Goal Area(s) Addresses:  Patient will effectively listen to complete activity.  Patient will identify communication skills used to make activity successful.  Patient will identify how skills used during activity can be used to reach post d/c goals.    Behavioral Response:    Intervention: Building Surveyor Activity - Geometric pattern cards, pencils, blank paper    Activity: Geometric Drawings.  Three volunteers from the peer group will be shown an abstract picture with a particular arrangement of geometrical shapes.  Each round, one 'speaker' will describe the pattern, as accurately as possible without revealing the image to the group.  The remaining group members will listen and draw the picture to reflect how it is described to them. Patients with the role of 'listener' cannot ask clarifying questions but, may request that the speaker repeat a direction. Once the drawings are complete, the presenter will show the rest of the group the picture and compare how close each person came to drawing the picture. LRT will facilitate a post-activity discussion regarding effective communication and the importance of planning, listening, and asking for clarification in daily interactions with others.  Education: Environmental consultant, Active listening, Support systems, Discharge planning  Education Outcome: Acknowledges understanding/In group clarification offered/Needs additional education.    Affect/Mood: N/A   Participation Level: Did not attend    Clinical Observations/Individualized Feedback:      Plan: Continue to engage patient in RT group sessions 2-3x/week.   Glee Lashomb-McCall, LRT,CTRS 08/17/2024 1:26 PM

## 2024-08-17 NOTE — Group Note (Signed)
 Date:  08/17/2024 Time:  11:01 AM  Group Topic/Focus: Group Topic/Focus: Recreational Therapy  Patients played blind picturing  where the speaker describes the geometrical image in detail then the drawer has to recreate the image of the picture.      Participation Level:  Did Not Attend   Melinda Jimenez 08/17/2024, 11:01 AM

## 2024-08-18 MED ORDER — TRAZODONE HCL 100 MG PO TABS
100.0000 mg | ORAL_TABLET | Freq: Every evening | ORAL | Status: DC | PRN
  Filled 2024-08-18: qty 7

## 2024-08-18 NOTE — Group Note (Signed)
 Date:  08/18/2024 Time:  12:18 PM  Group Topic/Focus: Social work  Social work helps clients with low confidence by assessing challenges, empowering them through skills and support, fostering self-esteem, and helping them navigate social and printmaker. Confidence is both a goal and a tool for mental health recovery.    Participation Level:  Did Not Attend   Melinda Jimenez 08/18/2024, 12:18 PM

## 2024-08-18 NOTE — Group Note (Signed)
 LCSW Group Therapy Note   Group Date: 08/18/2024 Start Time: 1100 End Time: 1200   Participation:  did not attend  Type of Therapy:  Group Therapy  Topic:  Shining from Within:  Confidence and Self-Love Journey   Objective:  To support participants in developing confidence and self-love through self-awareness, self-compassion, and practical skills that nurture personal growth.   Group Goals Encourage self-reflection and self-acceptance by identifying personal strengths and achievements. Teach skills to challenge negative self-talk and replace it with supportive, truthful self-talk. Foster resilience and self-worth through owens & minor, gratitude, and self-care practices.   Summary:  This group explores the connection between confidence and self-love by guiding participants through reflection, mindset shifts, and practical tools like affirmations, strength recognition, and goal-setting. Activities are designed to promote self-compassion, build emotional resilience, and normalize the slow, patient journey of inner growth.   Therapeutic Modalities Used Cognitive Behavioral Therapy (CBT): Challenging and reframing unhelpful self-talk. Motivational Interviewing (MI): Encouraging small, achievable goals. Elements of Dialectical Behavioral Therapist (DBT):  Mindfulness and Self-Compassion: Promoting present-moment awareness and kindness toward self.   Bryn Saline O Lianni Kanaan, LCSWA 08/18/2024  12:26 PM

## 2024-08-18 NOTE — Group Note (Signed)
 Date:  08/18/2024 Time:  10:28 AM  Group Topic/Focus: Pet therapy is a therapeutic approach where trained animals--most commonly dogs, cats, horses, or even rabbits--are used to help improve the emotional, social, and mental well-being of people living with mental health conditions.    Participation Level:  Did Not Attend   Dolores CHRISTELLA Fredericks 08/18/2024, 10:28 AM

## 2024-08-18 NOTE — Progress Notes (Cosign Needed Addendum)
 [facility] MD Progress Note  08/18/2024 8:53 PM Melinda Jimenez  MRN:  [id number]  Principal Problem: Bipolar affective disorder, depressed, severe (HCC) Diagnosis: Principal Problem:   Bipolar affective disorder, depressed, severe (HCC) Active Problems:   GAD (generalized anxiety disorder)   PTSD (post-traumatic stress disorder)   Melinda Jimenez is a 38 y.o. female  with a past psychiatric history of MDD, GAD, bipolar 1, borderline personality disorder. Patient initially arrived to [facility] on 11/12 for increased suicidal thoughts, and admitted to [facility] on 11/14 for acute safety concerns. PMHx is significant for HTN, prediabetes, and vitamin D  deficiency.  (admitted on 08/13/2024, total  LOS: 5 days )  Chart Review: Overnight Events MAR was reviewed and patient was NOT compliant with psychotropic medications. Patient is continuing to refuse nifedipine . PRNs: None. Labs: CBC (Active).  Nursing notes: Has attended some unit group sessions, but most of not being attended.  Subjective: [name] was seen in her room during rounds today. She describes her mood as "I've been frustrated," and reports that she has been crying and "had a breakdown." She explains, "I tend to take stuff that I would not do to other people."  She states her appetite is "okay," though she often does not eat midday because she has breakfast and snacks. She denies any difficulties with sleep and denies suicidal or homicidal ideation. She also denies experiencing psychotic symptoms or any side effects from her psychiatric medications. [name] states she refused her medication this morning because she took a dose last night. She was reminded that she previously consented to consolidating her Abilify  to a morning dose due to her concern that nighttime dosing interfered with sleep and caused headaches. She confirmed this was accurate and stated she would see the nurse to take her medication. However, chart review later showed she continued  to refuse her Abilify  and therefore did not take any psychotropic medication today. [name] reports she does not need trazodone  because she does not have sleep difficulties. She was informed that trazodone  would be changed from nightly to nightly as needed. She reports that her assessment with the ACT Team went well yesterday. [name] states she does not feel ready for discharge today.   Past Medical History:  Past Medical History:  Diagnosis Date   Anemia    Anxiety    off meds with preg   Bipolar 1 disorder (HCC) 03/10/2024   Borderline personality disorder (HCC) 03/10/2024   Brief psychotic disorder (HCC) 04/03/2020   Carpal tunnel syndrome    Cesarean delivery delivered 10/10/2020   Chlamydia    Chronic hypertension    Chronic post-traumatic stress disorder (PTSD) 03/10/2024   COVID-19 09/12/2020   Genital HSV    Last OB: > 41yr ago; Valtrex  500 mg po prn outbreaks   GERD (gastroesophageal reflux disease)    GERD without esophagitis 01/20/2013   Gestational diabetes    diet controlled   Goiter, unspecified 04/18/2010   Centricity Description: THYROMEGALY  Qualifier: Diagnosis of   By: Lelon RIGGERS, Scott      Centricity Description: GOITER  Qualifier: Diagnosis of   By: Lelon RIGGERS, Scott      Replacing diagnoses that were inactivated after the 12/31/22 regulatory import     H/O: cesarean section 09/27/2016   last C-section for active HSV, C-section X1: 05/20/13; records in [facility]  TOLAC desired, signed 09/27/16      Headache(784.0)    Herpes genitalis in women 04/18/2010   Qualifier: Diagnosis of   By:  Lelon RIGGERS, Scott         IBS (irritable bowel syndrome)    Infection    UTI   Intrauterine pregnancy 03/22/2020   EDC 11/14/2020     Panic attack    Premature rupture of membranes (PROM), onset of labor after 24 hours, antepartum, preterm, third trimester 10/10/2020   Pulmonary embolus (HCC) 10/18/2020   Thyroid  disease    Trichomonas    Vitamin D  deficiency 03/10/2024    Family History:  Family History  Problem Relation Age of Onset   Depression Mother    Anxiety disorder Mother    Breast cancer Other    Bipolar disorder Brother    Diabetes Maternal Grandmother    Cancer Maternal Grandmother    Hypertension Maternal Grandmother    Hypertension Paternal Grandmother    Autism Other    Schizophrenia Other     Current Medications: Current Facility-Administered Medications  Medication Dose Route Frequency Provider Last Rate Last Admin   acetaminophen  (TYLENOL ) tablet 650 mg  650 mg Oral Q6H PRN Marry Clamp, MD       alum & mag hydroxide-simeth (MAALOX/MYLANTA) 200-200-20 MG/5ML suspension 30 mL  30 mL Oral Q4H PRN Marry Clamp, MD       ARIPiprazole  (ABILIFY ) tablet 10 mg  10 mg Oral Daily Dain Laseter H, NP       haloperidol  (HALDOL ) tablet 5 mg  5 mg Oral TID PRN Marry Clamp, MD       And   diphenhydrAMINE  (BENADRYL ) capsule 50 mg  50 mg Oral TID PRN Marry Clamp, MD       haloperidol  lactate (HALDOL ) injection 5 mg  5 mg Intramuscular TID PRN Marry Clamp, MD       And   diphenhydrAMINE  (BENADRYL ) injection 50 mg  50 mg Intramuscular TID PRN Marry Clamp, MD       And   LORazepam  (ATIVAN ) injection 2 mg  2 mg Intramuscular TID PRN Marry Clamp, MD       haloperidol  lactate (HALDOL ) injection 10 mg  10 mg Intramuscular TID PRN Marry Clamp, MD       And   diphenhydrAMINE  (BENADRYL ) injection 50 mg  50 mg Intramuscular TID PRN Marry Clamp, MD       And   LORazepam  (ATIVAN ) injection 2 mg  2 mg Intramuscular TID PRN Marry Clamp, MD       ferrous sulfate  tablet 325 mg  325 mg Oral BID WC Ntuen, Tina C, FNP   325 mg at 08/17/24 9192   hydrOXYzine  (ATARAX ) tablet 25 mg  25 mg Oral TID PRN Ntuen, Tina C, FNP       ibuprofen  (ADVIL ) tablet 600 mg  600 mg Oral Q6H PRN Daylene Vandenbosch H, NP       magnesium  hydroxide (MILK OF MAGNESIA) suspension 30 mL  30 mL Oral Daily PRN Marry Clamp, MD       NIFEdipine   (PROCARDIA -XL/NIFEDICAL-XL) 24 hr tablet 30 mg  30 mg Oral Daily Ntuen, Tina C, FNP       traZODone  (DESYREL ) tablet 100 mg  100 mg Oral QHS PRN Niley Helbig H, NP       Vitamin D  (Ergocalciferol ) (DRISDOL ) 1.25 MG (50000 UNIT) capsule 50,000 Units  50,000 Units Oral Q7 days Tex Drilling, NP        Lab Results: No results found for this or any previous visit (from the past 48 hours).  Blood Alcohol level:  Lab Results  Component Value Date  ETH <15 03/09/2024   ETH <10 11/26/2022    Metabolic Labs: Lab Results  Component Value Date   HGBA1C 6.0 (H) 08/12/2024   MPG 125.5 08/12/2024   MPG 122.63 07/28/2024   No results found for: PROLACTIN Lab Results  Component Value Date   CHOL 126 08/12/2024   TRIG 93 08/12/2024   HDL 35 (L) 08/12/2024   CHOLHDL 3.6 08/12/2024   VLDL 19 08/12/2024   LDLCALC 72 08/12/2024   LDLCALC 71 07/29/2024    Sleep:Sleep: Good     Physical Findings: AIMS: No  CIWA:    COWS:     Psychiatric Specialty Exam:  Presentation  General Appearance: Fairly Groomed  Eye Contact: intense  Speech: clear and coherent  Speech Volume:Normal  Handedness:Right   Mood and Affect  Mood: I've been frustrated.   Affect:Restricted   Thought Process  Thought Processes:Coherent  Descriptions of Associations:Intact  Orientation:Full (Time, Place and Person)  Thought Content:Logical  History of Schizophrenia/Schizoaffective disorder:No  Duration of Psychotic Symptoms:Less than six months  Hallucinations:Hallucinations: None    Ideas of Reference:None  Suicidal Thoughts: No  Homicidal Thoughts:No data recorded    Sensorium  Memory:Immediate Fair  Judgment:Poor  Insight:Fair   Executive Functions  Concentration:Fair  Attention Span:Fair  Recall:Fair  Fund of Knowledge:Fair  Language:Fair   Psychomotor Activity  Psychomotor Activity:Psychomotor Activity: Normal     Assets  Assets:Desire for  Improvement   Sleep  Sleep:Sleep: Good     Physical Exam Vitals and nursing note reviewed.  Constitutional:      General: She is not in acute distress.    Appearance: She is not ill-appearing.  HENT:     Mouth/Throat:     Pharynx: Oropharynx is clear.  Cardiovascular:     Rate and Rhythm: Normal rate.     Pulses: Normal pulses.  Pulmonary:     Effort: No respiratory distress.  Neurological:     Mental Status: She is alert and oriented to person, place, and time.    Review of Systems  Psychiatric/Behavioral:  Positive for depression, hallucinations and substance abuse. Negative for memory loss and suicidal ideas. The patient is nervous/anxious and has insomnia.   All other systems reviewed and are negative.   Physical Exam Constitutional:      Appearance: the patient is not toxic-appearing.  Pulmonary:     Effort: Pulmonary effort is normal.  Neurological:     General: No focal deficit present.     Mental Status: the patient is alert and oriented to person, place, and time.   Review of Systems  Respiratory:  Negative for shortness of breath.   Cardiovascular:  Negative for chest pain.  Gastrointestinal:  Negative for abdominal pain, constipation, diarrhea, nausea and vomiting.  Neurological:  Negative for headaches.   Blood pressure 124/84, pulse 91, temperature 98.2 F (36.8 C), temperature source Oral, resp. rate 16, height 5' 7 (1.702 m), weight 100.9 kg, SpO2 100%. Body mass index is 34.83 kg/m.  Treatment Plan Summary: Daily contact with patient to assess and evaluate symptoms and progress in treatment and Medication management  11/18: Today, nursing staff report the patient went down to the cafeteria for breakfast this morning, which is not typical for her, and that she refused trazodone  the previous night. Nursing notes indicate she is not routinely attending unit groups but briefly entered the pet therapy group today. Her medication noncompliance  continues, despite education regarding the importance of adherence for mood stabilization. She denies symptoms of psychosis, though her  mood appears labile. Continuation of her current psychiatric treatment plan is indicated. A long-acting injectable antipsychotic was strongly recommended prior to discharge; however, the patient declined despite encouragement. She denies any dyskinesia or EPS type symptoms. Abilify  consolidation to a morning dose has been implemented, though the patient continues to refuse the medication. Per social work documentation, she completed an initial assessment with [facility]T and agreed to services. She continues to refuse Procardia ; vital signs reviewed this morning show a blood pressure of 124/84.     Safety and Monitoring: Voluntary admission to inpatient psychiatric unit for safety, stabilization and treatment Daily contact with patient to assess and evaluate symptoms and progress in treatment Patient's case to be discussed in multi-disciplinary team meeting Observation Level : q15 minute checks Vital signs: q12 hours Precautions: Safety  Long Term Goal(s): Improvement in symptoms so as ready for discharge  Short Term Goals: Ability to identify changes in lifestyle to reduce recurrence of condition will improve, Ability to verbalize feelings will improve, Ability to disclose and discuss suicidal ideas, Ability to demonstrate self-control will improve, Ability to identify and develop effective coping behaviors will improve, Ability to maintain clinical measurements within normal limits will improve, Compliance with prescribed medications will improve, and Ability to identify triggers associated with substance abuse/mental health issues will improve  Diagnoses Principal Problem:   Bipolar affective disorder, depressed, severe (HCC) Active Problems:   GAD (generalized anxiety disorder)   PTSD (post-traumatic stress disorder)  Medications: - Continue  Abilify  10 mg daily for mood stabilization - Reschedule Trazodone  100 mg to nightly as needed for sleep  - Continue Vitamin D  50,000 units weekly for low Vit D levels - Discontinue Nicoderm Cq 21 mg due to consecutive refusals 11/16 - Continue Nifedipine  XR 30 mg daily for HTN   PRNS -Continue Tylenol  650 mg every 6 hours PRN for mild pain -Continue Maalox 30 mg every 4 hrs PRN for indigestion -Continue Milk of Magnesia as needed every 6 hrs for constipation  Discharge Planning: Social work and case management to assist with discharge planning and identification of hospital follow-up needs prior to discharge Estimated LOS: 5-7 days Discharge Concerns: Need to establish a safety plan; Medication compliance and effectiveness Discharge Goals: Return home with outpatient referrals for mental health follow-up including medication management/psychotherapy  I certify that inpatient services furnished can reasonably be expected to improve the patient's condition.   Total Time Spent in Direct Patient Care:   I personally spent 45 minutes on the unit in direct patient care. The direct patient care time included face-to-face time with the patient, reviewing the patient's chart, communicating with other professionals, and coordinating care. Greater than 50% of this time was spent in counseling or coordinating care with the patient regarding goals of hospitalization, psycho-education, and discharge planning needs.    Blair Chiquita Hint, [place] 11/18/20258:53 PM     11/18/20258:53 PM Patient ID: Levon JONETTA Sharps, female   DOB: 10-20-1985, 38 y.o.   MRN: [id number] Patient ID: HARLY PIPKINS, female   DOB: Feb 24, 1986, 38 y.o.   MRN: [id number] Patient ID: TANAYA DUNIGAN, female   DOB: Jun 14, 1986, 38 y.o.   MRN: [id number]

## 2024-08-18 NOTE — Progress Notes (Signed)
 [facility] [phone] present in hospital to complete initial assessment for ACTT services. Pt completed paperwork and has agreed to ACTT. Pt reported she would need housing and would like to work towards reunification of her children. Pt's children currently in the custody of DSS of [facility].   LCSW will continue to follow and update Treatment team.

## 2024-08-18 NOTE — Group Note (Signed)
 Date:  08/18/2024 Time:  5:05 PM  Group Topic/Focus:  Self Care:   The focus of this group is to help patients understand sleep hygiene and the importance of self-care in order to improve or restore emotional, physical, spiritual, interpersonal, and financial health.   Additional Comments:  patient did not attend  Melinda Jimenez 08/18/2024, 5:05 PM

## 2024-08-18 NOTE — Group Note (Signed)
 Recreation Therapy Group Note   Group Topic:Animal Assisted Therapy   Group Date: 08/18/2024 Start Time: 0945 End Time: 1030 Facilitators: Carrington Mullenax-McCall, LRT,CTRS Location: [address] Dayroom   Animal-Assisted Activity (AAA) Program Checklist/Progress Notes Patient Eligibility Criteria Checklist & Daily Group note for Rec Tx Intervention  AAA/T Program Assumption of Risk Form signed by Patient/ or Parent Legal Guardian Yes  Patient understands his/her participation is voluntary Yes  Behavioral Response: Minimal   Education: Hand Washing, Appropriate Animal Interaction   Education Outcome: Acknowledges education.    Affect/Mood: Appropriate   Participation Level: Minimal   Participation Quality: Independent   Behavior: Appropriate   Speech/Thought Process: Relevant   Insight: Moderate   Judgement: Moderate   Modes of Intervention: Teaching Laboratory Technician   Patient Response to Interventions:  Receptive   Education Outcome:  In group clarification offered    Clinical Observations/Individualized Feedback: Pt popped into group for a few minutes to see Dixie. Pt asked a question and pet her before leaving group.      Plan: Continue to engage patient in RT group sessions 2-3x/week.   Nial Hawe-McCall, LRT,CTRS 08/18/2024 12:57 PM

## 2024-08-18 NOTE — Group Note (Signed)
 Date:  08/18/2024 Time:  10:08 AM  Group Topic/Focus: Coping and goals group  Goals Group:   The focus of this group is to help patients establish daily goals to achieve during treatment and discuss how the patient can incorporate goal setting into their daily lives to aide in recovery. Coping skills for patients (more respectfully called people living with mental health conditions) are tools, strategies, or behaviors that help a person manage stress, difficult emotions, symptoms, and challenging situations.   Participation Level:  Did Not Attend   Dolores CHRISTELLA Fredericks 08/18/2024, 10:08 AM

## 2024-08-18 NOTE — Progress Notes (Signed)
 D: Patient is alert, oriented, isolative, and uncooperative. Denies SI, HI, AVH, and verbally contracts for safety. Patient reports she slept good last night without sleeping medication. Epic recorded 4.75 hours of sleep last night. Patient reports her appetite as fair, energy level as normal, and concentration as fair. Patient rates her depression 4/10, hopelessness 0/10, and anxiety 0/10. Patient denies physical symptoms/pain.    A: Patient refused all scheduled medications this morning (abilify , iron, nifedipine ). Support provided. Patient educated on safety on the unit and medications. Routine safety checks every 15 minutes. Patient stated understanding to tell nurse about any new physical symptoms. Patient understands to tell staff of any needs.     R: No adverse drug reactions noted. Patient remains safe at this time and will continue to monitor.    08/18/24 0900  Psych Admission Type (Psych Patients Only)  Admission Status Voluntary  Psychosocial Assessment  Patient Complaints Anxiety;Isolation  Eye Contact Fair  Facial Expression Flat  Affect Anxious;Preoccupied  Speech Logical/coherent  Interaction Guarded  Motor Activity Other (Comment) (WNL)  Appearance/Hygiene Unremarkable  Behavior Characteristics Unwilling to participate;Guarded  Mood Preoccupied  Thought Process  Coherency WDL  Content WDL  Delusions None reported or observed  Perception WDL  Hallucination None reported or observed  Judgment Limited  Confusion None  Danger to Self  Current suicidal ideation? Denies  Self-Injurious Behavior No self-injurious ideation or behavior indicators observed or expressed   Agreement Not to Harm Self Yes  Description of Agreement verbal  Danger to Others  Danger to Others None reported or observed  Danger to Others Abnormal  Harmful Behavior to others No threats or harm toward other people  Destructive Behavior No threats or harm toward property

## 2024-08-18 NOTE — [facility] Group Notes (Signed)
 [facility] Group Notes:  (Nursing/MHT/Case Management/Adjunct)  Date:  08/18/2024  Time:  10:09 PM  Type of Therapy:  Psychoeducational Skills  Participation Level:  Did Not Attend  Participation Quality:  Resistant  Affect:  did not attend  Cognitive:  did not attend  Insight:  None  Engagement in Group:  None  Modes of Intervention:  Education  Summary of Progress/Problems: The patient did not attend group this evening.   [name] 08/18/2024, 10:09 PM

## 2024-08-18 NOTE — Progress Notes (Signed)
(  Sleep Hours) -4.75 as of 0530 (Any PRNs that were needed, meds refused, or side effects to meds)- refused trazodone  (Any disturbances and when (visitation, over night)-none (Concerns raised by the patient)- none (SI/HI/AVH)- denies all

## 2024-08-18 NOTE — Plan of Care (Signed)

## 2024-08-19 ENCOUNTER — Encounter (HOSPITAL_COMMUNITY): Payer: Self-pay

## 2024-08-19 MED ORDER — ARIPIPRAZOLE 15 MG PO TABS
15.0000 mg | ORAL_TABLET | Freq: Every day | ORAL | Status: DC
  Administered 2024-08-20 – 2024-08-21 (×2): 15 mg via ORAL
  Filled 2024-08-19: qty 1
  Filled 2024-08-19: qty 7
  Filled 2024-08-19: qty 1

## 2024-08-19 NOTE — Progress Notes (Signed)
   08/19/24 0811  Psych Admission Type (Psych Patients Only)  Admission Status Voluntary  Psychosocial Assessment  Patient Complaints Isolation  Eye Contact Fair  Facial Expression Flat  Affect Preoccupied  Speech Logical/coherent  Interaction Guarded  Motor Activity Other (Comment) (wdl)  Appearance/Hygiene Unremarkable  Behavior Characteristics Unwilling to participate;Irritable  Mood Preoccupied  Aggressive Behavior  Effect No apparent injury  Thought Process  Coherency WDL  Content WDL  Delusions None reported or observed  Perception WDL  Hallucination None reported or observed  Judgment Limited  Confusion None  Danger to Self  Current suicidal ideation? Denies  Self-Injurious Behavior No self-injurious ideation or behavior indicators observed or expressed   Agreement Not to Harm Self Yes  Description of Agreement Verbal  Danger to Others  Danger to Others None reported or observed  Danger to Others Abnormal  Harmful Behavior to others No threats or harm toward other people  Destructive Behavior No threats or harm toward property

## 2024-08-19 NOTE — Plan of Care (Signed)
  Problem: Education: Goal: Mental status will improve Outcome: Progressing   

## 2024-08-19 NOTE — Group Note (Signed)
 Date:  08/19/2024 Time:  1:52 PM  Group Topic/Focus:  Chaplain Group     Participation Level:  Did Not Attend      Hayzen Lorenson Byrd 08/19/2024, 1:52 PM

## 2024-08-19 NOTE — Progress Notes (Signed)
 [facility] MD Progress Note  08/19/2024 2:00 PM Melinda Jimenez  MRN:  [id number]  Principal Problem: Bipolar affective disorder, depressed, severe (HCC) Diagnosis: Principal Problem:   Bipolar affective disorder, depressed, severe (HCC) Active Problems:   GAD (generalized anxiety disorder)   PTSD (post-traumatic stress disorder)   Severe episode of recurrent major depressive disorder, with psychotic features (HCC)  Reason for admission: Melinda Jimenez is a 38 y.o. female  with a past psychiatric history of MDD, GAD, bipolar 1, borderline personality disorder. Patient initially arrived to [facility] on 11/12 for increased suicidal thoughts, and admitted to [facility] on 11/14 for acute safety concerns. PMHx is significant for HTN, prediabetes, and vitamin D  deficiency.  (admitted on 08/13/2024, total  LOS: 6 days )  Chart Review: Overnight Events MAR was reviewed and patient was NOT compliant with psychotropic medications. Patient is continuing to refuse nifedipine . PRNs: None. Labs: CBC (Active).  Nursing notes: Has attended some unit group sessions, but most of not being attended.  08/19/2024 Today's assessment notes: Patient seen and examined in her room sitting up on her bed.  Chart reviewed and findings shared with the treatment team and consult with attending psychiatrist.  Patient report for past 3 days, she has been crying and depressed due to housing stability.  Reports she expected the hospital to find her accommodation prior to being discharged, however has not had any confirmatory report with regards to housing.  Reports she was assessed by the ACT team, and the assessment went well.  Reports her mood is better today, however, rates depression as #6/10 and anxiety as #5/10, with 10 being high severity.  Housing needs shared with the LCSW to follow-up with patient for resources.  Report her sleep is stable, nursing staff report patient sleeping 6.75 hours.  Reports appetite is improving and she  has more energy today.  Concentration is improving.  Patient denies SI, HI, or AVH.  Reports her goal today is to remain calm and may be will find housing prior to discharge.  Patient case discussed with the attending psychiatrist with recommendation to extend discharge date for another 2 days in the hospital due to her mood.  Abilify  increased from 10 mg to 15 mg as per attending psychiatrist recommendation.  subjective: [name] was seen in her room during rounds today. She describes her mood as "I've been frustrated," and reports that she has been crying and "had a breakdown." She explains, "I tend to take stuff that I would not do to other people."  She states her appetite is "okay," though she often does not eat midday because she has breakfast and snacks. She denies any difficulties with sleep and denies suicidal or homicidal ideation. She also denies experiencing psychotic symptoms or any side effects from her psychiatric medications. [name] states she refused her medication this morning because she took a dose last night. She was reminded that she previously consented to consolidating her Abilify  to a morning dose due to her concern that nighttime dosing interfered with sleep and caused headaches. She confirmed this was accurate and stated she would see the nurse to take her medication. However, chart review later showed she continued to refuse her Abilify  and therefore did not take any psychotropic medication today. [name] reports she does not need trazodone  because she does not have sleep difficulties. She was informed that trazodone  would be changed from nightly to nightly as needed. She reports that her assessment with the ACT Team went well yesterday. [name] states she  does not feel ready for discharge today.  Past Medical History:  Past Medical History:  Diagnosis Date   Anemia    Anxiety    off meds with preg   Bipolar 1 disorder (HCC) 03/10/2024   Borderline personality disorder (HCC) 03/10/2024    Brief psychotic disorder (HCC) 04/03/2020   Carpal tunnel syndrome    Cesarean delivery delivered 10/10/2020   Chlamydia    Chronic hypertension    Chronic post-traumatic stress disorder (PTSD) 03/10/2024   COVID-19 09/12/2020   Genital HSV    Last OB: > 76yr ago; Valtrex  500 mg po prn outbreaks   GERD (gastroesophageal reflux disease)    GERD without esophagitis 01/20/2013   Gestational diabetes    diet controlled   Goiter, unspecified 04/18/2010   Centricity Description: THYROMEGALY  Qualifier: Diagnosis of   By: Lelon RIGGERS, Scott      Centricity Description: GOITER  Qualifier: Diagnosis of   By: Lelon RIGGERS, Scott      Replacing diagnoses that were inactivated after the 12/31/22 regulatory import     H/O: cesarean section 09/27/2016   last C-section for active HSV, C-section X1: 05/20/13; records in [facility]  TOLAC desired, signed 09/27/16      Headache(784.0)    Herpes genitalis in women 04/18/2010   Qualifier: Diagnosis of   By: Lelon RIGGERS, Scott         IBS (irritable bowel syndrome)    Infection    UTI   Intrauterine pregnancy 03/22/2020   EDC 11/14/2020     Panic attack    Premature rupture of membranes (PROM), onset of labor after 24 hours, antepartum, preterm, third trimester 10/10/2020   Pulmonary embolus (HCC) 10/18/2020   Thyroid  disease    Trichomonas    Vitamin D  deficiency 03/10/2024   Family History:  Family History  Problem Relation Age of Onset   Depression Mother    Anxiety disorder Mother    Breast cancer Other    Bipolar disorder Brother    Diabetes Maternal Grandmother    Cancer Maternal Grandmother    Hypertension Maternal Grandmother    Hypertension Paternal Grandmother    Autism Other    Schizophrenia Other    Current Medications: Current Facility-Administered Medications  Medication Dose Route Frequency Provider Last Rate Last Admin   acetaminophen  (TYLENOL ) tablet 650 mg  650 mg Oral Q6H PRN Marry Clamp, MD       alum & mag  hydroxide-simeth (MAALOX/MYLANTA) 200-200-20 MG/5ML suspension 30 mL  30 mL Oral Q4H PRN Marry Clamp, MD       ARIPiprazole  (ABILIFY ) tablet 10 mg  10 mg Oral Daily Bennett, Christal H, NP   10 mg at 08/19/24 9188   haloperidol  (HALDOL ) tablet 5 mg  5 mg Oral TID PRN Marry Clamp, MD       And   diphenhydrAMINE  (BENADRYL ) capsule 50 mg  50 mg Oral TID PRN Marry Clamp, MD       haloperidol  lactate (HALDOL ) injection 5 mg  5 mg Intramuscular TID PRN Marry Clamp, MD       And   diphenhydrAMINE  (BENADRYL ) injection 50 mg  50 mg Intramuscular TID PRN Marry Clamp, MD       And   LORazepam  (ATIVAN ) injection 2 mg  2 mg Intramuscular TID PRN Marry Clamp, MD       haloperidol  lactate (HALDOL ) injection 10 mg  10 mg Intramuscular TID PRN Marry Clamp, MD       And   diphenhydrAMINE  (  BENADRYL ) injection 50 mg  50 mg Intramuscular TID PRN Marry Clamp, MD       And   LORazepam  (ATIVAN ) injection 2 mg  2 mg Intramuscular TID PRN Marry Clamp, MD       ferrous sulfate  tablet 325 mg  325 mg Oral BID WC Farhana Fellows C, FNP   325 mg at 08/19/24 9188   hydrOXYzine  (ATARAX ) tablet 25 mg  25 mg Oral TID PRN Ruwayda Curet C, FNP       ibuprofen  (ADVIL ) tablet 600 mg  600 mg Oral Q6H PRN Bennett, Christal H, NP       magnesium  hydroxide (MILK OF MAGNESIA) suspension 30 mL  30 mL Oral Daily PRN Marry Clamp, MD       NIFEdipine  (PROCARDIA -XL/NIFEDICAL-XL) 24 hr tablet 30 mg  30 mg Oral Daily Branton Einstein C, FNP       traZODone  (DESYREL ) tablet 100 mg  100 mg Oral QHS PRN Bennett, Christal H, NP       Vitamin D  (Ergocalciferol ) (DRISDOL ) 1.25 MG (50000 UNIT) capsule 50,000 Units  50,000 Units Oral Q7 days Tex Drilling, NP       Lab Results: No results found for this or any previous visit (from the past 48 hours).  Blood Alcohol level:  Lab Results  Component Value Date   [facility] <15 03/09/2024   ETH <10 11/26/2022   Metabolic Labs: Lab Results  Component Value Date   HGBA1C 6.0  (H) 08/12/2024   MPG 125.5 08/12/2024   MPG 122.63 07/28/2024   No results found for: PROLACTIN Lab Results  Component Value Date   CHOL 126 08/12/2024   TRIG 93 08/12/2024   HDL 35 (L) 08/12/2024   CHOLHDL 3.6 08/12/2024   VLDL 19 08/12/2024   LDLCALC 72 08/12/2024   LDLCALC 71 07/29/2024   Sleep:Sleep: Good Number of Hours of Sleep: 6.75  Physical Findings: AIMS: No  CIWA:    COWS:     Psychiatric Specialty Exam:  Presentation  General Appearance: Appropriate for Environment; Casual; Fairly Groomed  Eye Contact: intense  Speech: clear and coherent  Speech Volume:Normal  Handedness:Right  Mood and Affect  Mood: I've been frustrated.   Affect:Depressed; Congruent  Thought Process  Thought Processes:Coherent; Goal Directed  Descriptions of Associations:Intact  Orientation:Full (Time, Place and Person)  Thought Content:Logical  History of Schizophrenia/Schizoaffective disorder:No  Duration of Psychotic Symptoms:Less than six months  Hallucinations:Hallucinations: None  Ideas of Reference:None  Suicidal Thoughts: No  Homicidal Thoughts:Homicidal Thoughts: No  Sensorium  Memory:Immediate Fair  Judgment:Fair  Insight:Fair  Executive Functions  Concentration:Good  Attention Span:Good  Recall:Fair  Fund of Knowledge:Fair  Language:Good  Psychomotor Activity  Psychomotor Activity:Psychomotor Activity: Normal  Assets  Assets:Communication Skills; Desire for Improvement; Physical Health; Resilience  Sleep  Sleep:Sleep: Good Number of Hours of Sleep: 6.75  Physical Exam Vitals and nursing note reviewed.  Constitutional:      General: She is not in acute distress.    Appearance: She is not ill-appearing.  HENT:     Head: Normocephalic.     Nose: Nose normal.     Mouth/Throat:     Pharynx: Oropharynx is clear.  Eyes:     Extraocular Movements: Extraocular movements intact.  Cardiovascular:     Rate and Rhythm: Normal rate.      Pulses: Normal pulses.  Pulmonary:     Effort: No respiratory distress.  Musculoskeletal:        General: Normal range of motion.  Cervical back: Normal range of motion.  Skin:    General: Skin is dry.  Neurological:     General: No focal deficit present.     Mental Status: She is alert and oriented to person, place, and time.  Psychiatric:        Mood and Affect: Mood normal.        Behavior: Behavior normal.    Review of Systems  Constitutional:  Negative for chills and fever.  HENT:  Negative for sore throat.   Eyes:  Negative for blurred vision.  Respiratory:  Negative for cough, sputum production, shortness of breath and wheezing.   Cardiovascular:  Negative for chest pain and palpitations.  Gastrointestinal:  Negative for abdominal pain, constipation, diarrhea, heartburn, nausea and vomiting.  Genitourinary:  Negative for dysuria.  Musculoskeletal:  Negative for falls.  Skin:  Negative for itching and rash.  Neurological:  Negative for dizziness and headaches.  Endo/Heme/Allergies: Negative.   Psychiatric/Behavioral:  Positive for depression. Negative for hallucinations, memory loss, substance abuse and suicidal ideas. The patient is nervous/anxious. The patient does not have insomnia.   All other systems reviewed and are negative.   Physical Exam Constitutional:      Appearance: the patient is not toxic-appearing.  Pulmonary:     Effort: Pulmonary effort is normal.  Neurological:     General: No focal deficit present.     Mental Status: the patient is alert and oriented to person, place, and time.   Review of Systems  Respiratory:  Negative for shortness of breath.   Cardiovascular:  Negative for chest pain.  Gastrointestinal:  Negative for abdominal pain, constipation, diarrhea, nausea and vomiting.  Neurological:  Negative for headaches.   Blood pressure 119/82, pulse 98, temperature 97.9 F (36.6 C), temperature source Oral, resp. rate 16, height 5'  7 (1.702 m), weight 100.9 kg, SpO2 99%. Body mass index is 34.83 kg/m.  Treatment Plan Summary: Daily contact with patient to assess and evaluate symptoms and progress in treatment and Medication management  11/18: Today, nursing staff report the patient went down to the cafeteria for breakfast this morning, which is not typical for her, and that she refused trazodone  the previous night. Nursing notes indicate she is not routinely attending unit groups but briefly entered the pet therapy group today. Her medication noncompliance continues, despite education regarding the importance of adherence for mood stabilization. She denies symptoms of psychosis, though her mood appears labile. Continuation of her current psychiatric treatment plan is indicated. A long-acting injectable antipsychotic was strongly recommended prior to discharge; however, the patient declined despite encouragement. She denies any dyskinesia or EPS type symptoms. Abilify  consolidation to a morning dose has been implemented, though the patient continues to refuse the medication. Per social work documentation, she completed an initial assessment with [facility] and agreed to services. She continues to refuse Procardia ; vital signs reviewed this morning show a blood pressure of 124/84.     Safety and Monitoring: Voluntary admission to inpatient psychiatric unit for safety, stabilization and treatment Daily contact with patient to assess and evaluate symptoms and progress in treatment Patient's case to be discussed in multi-disciplinary team meeting Observation Level : q15 minute checks Vital signs: q12 hours Precautions: Safety  Long Term Goal(s): Improvement in symptoms so as ready for discharge  Short Term Goals: Ability to identify changes in lifestyle to reduce recurrence of condition will improve, Ability to verbalize feelings will improve, Ability to disclose and discuss suicidal ideas, Ability to  demonstrate  self-control will improve, Ability to identify and develop effective coping behaviors will improve, Ability to maintain clinical measurements within normal limits will improve, Compliance with prescribed medications will improve, and Ability to identify triggers associated with substance abuse/mental health issues will improve  Diagnoses Principal Problem:   Bipolar affective disorder, depressed, severe (HCC) Active Problems:   GAD (generalized anxiety disorder)   PTSD (post-traumatic stress disorder)   Severe episode of recurrent major depressive disorder, with psychotic features (HCC)  Medications: - Continue Abilify  10 mg daily for mood stabilization - Reschedule Trazodone  100 mg to nightly as needed for sleep  - Continue Vitamin D  50,000 units weekly for low Vit D levels - Discontinue Nicoderm Cq  21 mg due to consecutive refusals 11/16 - Continue Nifedipine  XR 30 mg daily for HTN  PRNS -Continue Tylenol  650 mg every 6 hours PRN for mild pain -Continue Maalox 30 mg every 4 hrs PRN for indigestion -Continue Milk of Magnesia as needed every 6 hrs for constipation  Discharge Planning: Social work and case management to assist with discharge planning and identification of hospital follow-up needs prior to discharge Estimated LOS: 5-7 days Discharge Concerns: Need to establish a safety plan; Medication compliance and effectiveness Discharge Goals: Return home with outpatient referrals for mental health follow-up including medication management/psychotherapy  I certify that inpatient services furnished can reasonably be expected to improve the patient's condition.    Total Time Spent in Direct Patient Care: 45 minutes  Ellouise JAYSON Azure, FNP 11/19/20252:00 PM     11/19/20252:00 PM Patient ID: Levon JONETTA Sharps, female   DOB: 20-Jan-1986, 38 y.o.   MRN: [id number] Patient ID: MARILUZ CRESPO, female   DOB: Oct 15, 1985, 38 y.o.   MRN: [id number] Patient ID: ADORIA KAWAMOTO, female   DOB: 02-18-1986, 38  y.o.   MRN: [id number] Patient ID: LESHAWN HOUSEWORTH, female   DOB: 12-21-1985, 38 y.o.   MRN: [id number]

## 2024-08-19 NOTE — [facility] Group Notes (Signed)
 [facility] Group Notes:  (Nursing/MHT/Case Management/Adjunct)  Date:  08/19/2024  Time:  10:23 PM  Type of Therapy:  Psychoeducational Skills  Participation Level:  Did Not Attend  Participation Quality:  did not attend   Affect:  Resistant  Cognitive:  did not attend  Insight:  None  Engagement in Group:  did not attend   Modes of Intervention:  Education  Summary of Progress/Problems: Pt did not attend group this evening.   Tagen Milby S 08/19/2024, 10:23 PM

## 2024-08-19 NOTE — Group Note (Signed)
 Date:  08/19/2024 Time:  11:59 AM  Group Topic/Focus:  Pharmacy Group    Participation Level:  Did Not Attend    Austin Pongratz Byrd 08/19/2024, 11:59 AM

## 2024-08-19 NOTE — BH IP Treatment Plan (Signed)
 Interdisciplinary Treatment and Diagnostic Plan Update  08/19/2024 Time of Session: THIS IS AN UPDATE Melinda Jimenez MRN: [id number]  Principal Diagnosis: Bipolar affective disorder, depressed, severe (HCC)  Secondary Diagnoses: Principal Problem:   Bipolar affective disorder, depressed, severe (HCC) Active Problems:   GAD (generalized anxiety disorder)   PTSD (post-traumatic stress disorder)   Current Medications:  Current Facility-Administered Medications  Medication Dose Route Frequency Provider Last Rate Last Admin   acetaminophen  (TYLENOL ) tablet 650 mg  650 mg Oral Q6H PRN Marry Clamp, MD       alum & mag hydroxide-simeth (MAALOX/MYLANTA) 200-200-20 MG/5ML suspension 30 mL  30 mL Oral Q4H PRN Marry Clamp, MD       ARIPiprazole  (ABILIFY ) tablet 10 mg  10 mg Oral Daily Bennett, Christal H, NP   10 mg at 08/19/24 9188   haloperidol  (HALDOL ) tablet 5 mg  5 mg Oral TID PRN Marry Clamp, MD       And   diphenhydrAMINE  (BENADRYL ) capsule 50 mg  50 mg Oral TID PRN Marry Clamp, MD       haloperidol  lactate (HALDOL ) injection 5 mg  5 mg Intramuscular TID PRN Marry Clamp, MD       And   diphenhydrAMINE  (BENADRYL ) injection 50 mg  50 mg Intramuscular TID PRN Marry Clamp, MD       And   LORazepam  (ATIVAN ) injection 2 mg  2 mg Intramuscular TID PRN Marry Clamp, MD       haloperidol  lactate (HALDOL ) injection 10 mg  10 mg Intramuscular TID PRN Marry Clamp, MD       And   diphenhydrAMINE  (BENADRYL ) injection 50 mg  50 mg Intramuscular TID PRN Marry Clamp, MD       And   LORazepam  (ATIVAN ) injection 2 mg  2 mg Intramuscular TID PRN Marry Clamp, MD       ferrous sulfate  tablet 325 mg  325 mg Oral BID WC Ntuen, Tina C, FNP   325 mg at 08/19/24 9188   hydrOXYzine  (ATARAX ) tablet 25 mg  25 mg Oral TID PRN Ntuen, Tina C, FNP       ibuprofen  (ADVIL ) tablet 600 mg  600 mg Oral Q6H PRN Bennett, Christal H, NP       magnesium  hydroxide (MILK OF MAGNESIA)  suspension 30 mL  30 mL Oral Daily PRN Marry Clamp, MD       NIFEdipine  (PROCARDIA -XL/NIFEDICAL-XL) 24 hr tablet 30 mg  30 mg Oral Daily Ntuen, Tina C, FNP       traZODone  (DESYREL ) tablet 100 mg  100 mg Oral QHS PRN Bennett, Christal H, NP       Vitamin D  (Ergocalciferol ) (DRISDOL ) 1.25 MG (50000 UNIT) capsule 50,000 Units  50,000 Units Oral Q7 days Tex Drilling, NP       PTA Medications: Medications Prior to Admission  Medication Sig Dispense Refill Last Dose/Taking   hydrOXYzine  (ATARAX ) 25 MG tablet Take 1 tablet (25 mg total) by mouth 3 (three) times daily as needed for anxiety. (Patient not taking: Reported on 08/13/2024) 30 tablet 0    traZODone  (DESYREL ) 50 MG tablet Take 1 tablet (50 mg total) by mouth at bedtime as needed for sleep. (Patient not taking: Reported on 08/13/2024) 30 tablet 0     Patient Stressors: Financial difficulties   Medication change or noncompliance    Patient Strengths: Average or above average intelligence  Motivation for treatment/growth   Treatment Modalities: Medication Management, Group therapy, Case management,  1 to 1 session with  clinician, Psychoeducation, Recreational therapy.   Physician Treatment Plan for Primary Diagnosis: Bipolar affective disorder, depressed, severe (HCC) Long Term Goal(s): Improvement in symptoms so as ready for discharge   Short Term Goals: Ability to identify changes in lifestyle to reduce recurrence of condition will improve Ability to verbalize feelings will improve Ability to disclose and discuss suicidal ideas Ability to demonstrate self-control will improve Ability to identify and develop effective coping behaviors will improve Ability to maintain clinical measurements within normal limits will improve Compliance with prescribed medications will improve Ability to identify triggers associated with substance abuse/mental health issues will improve  Medication Management: Evaluate patient's response, side  effects, and tolerance of medication regimen.  Therapeutic Interventions: 1 to 1 sessions, Unit Group sessions and Medication administration.  Evaluation of Outcomes: Progressing  Physician Treatment Plan for Secondary Diagnosis: Principal Problem:   Bipolar affective disorder, depressed, severe (HCC) Active Problems:   GAD (generalized anxiety disorder)   PTSD (post-traumatic stress disorder)  Long Term Goal(s): Improvement in symptoms so as ready for discharge   Short Term Goals: Ability to identify changes in lifestyle to reduce recurrence of condition will improve Ability to verbalize feelings will improve Ability to disclose and discuss suicidal ideas Ability to demonstrate self-control will improve Ability to identify and develop effective coping behaviors will improve Ability to maintain clinical measurements within normal limits will improve Compliance with prescribed medications will improve Ability to identify triggers associated with substance abuse/mental health issues will improve     Medication Management: Evaluate patient's response, side effects, and tolerance of medication regimen.  Therapeutic Interventions: 1 to 1 sessions, Unit Group sessions and Medication administration.  Evaluation of Outcomes: Progressing   RN Treatment Plan for Primary Diagnosis: Bipolar affective disorder, depressed, severe (HCC) Long Term Goal(s): Knowledge of disease and therapeutic regimen to maintain health will improve  Short Term Goals: Ability to verbalize frustration and anger appropriately will improve, Ability to demonstrate self-control, Ability to verbalize feelings will improve, and Compliance with prescribed medications will improve  Medication Management: RN will administer medications as ordered by provider, will assess and evaluate patient's response and provide education to patient for prescribed medication. RN will report any adverse and/or side effects to prescribing  provider.  Therapeutic Interventions: 1 on 1 counseling sessions, Psychoeducation, Medication administration, Evaluate responses to treatment, Monitor vital signs and CBGs as ordered, Perform/monitor CIWA, COWS, AIMS and Fall Risk screenings as ordered, Perform wound care treatments as ordered.  Evaluation of Outcomes: Progressing   LCSW Treatment Plan for Primary Diagnosis: Bipolar affective disorder, depressed, severe (HCC) Long Term Goal(s): Safe transition to appropriate next level of care at discharge, Engage patient in therapeutic group addressing interpersonal concerns.  Short Term Goals: Engage patient in aftercare planning with referrals and resources, Increase social support, Facilitate acceptance of mental health diagnosis and concerns, and Increase skills for wellness and recovery  Therapeutic Interventions: Assess for all discharge needs, 1 to 1 time with Social worker, Explore available resources and support systems, Assess for adequacy in community support network, Educate family and significant other(s) on suicide prevention, Complete Psychosocial Assessment, Interpersonal group therapy.  Evaluation of Outcomes: Progressing   Progress in Treatment: Attending groups: No. Participating in groups: No. Taking medication as prescribed: Yes and No.  Toleration medication: Yes. Family/Significant other contact made: Yes, individual(s) contacted:  father,Ricky Moosman [phone] Patient understands diagnosis: Yes. Discussing patient identified problems/goals with staff: Yes. Medical problems stabilized or resolved: Yes. Denies suicidal/homicidal ideation: Yes. Issues/concerns per patient self-inventory: No.  New problem(s) identified:  No   New Short Term/Long Term Goal(s):     medication stabilization, elimination of SI thoughts, development of comprehensive mental wellness plan.      Patient Goals:  I'm depressed and my energy is down.    Discharge Plan or Barriers:   Patient recently admitted. CSW will continue to follow and assess for appropriate referrals and possible discharge planning.      Reason for Continuation of Hospitalization: Depression Medication stabilization Suicidal ideation   Estimated Length of Stay:  1-3 days  Last 3 [place] Suicide Severity Risk Score: Flowsheet Row Admission (Current) [facility] from 08/12/2024 in [facility] (Discharged[facility]  C-SSRS RISK CATEGORY High Risk High Risk High Risk    Last PHQ 2/9 Scores:    02/17/2020   10:08 AM 05/14/2017    1:05 PM  Depression screen PHQ 2/9  Decreased Interest 3 0  Down, Depressed, Hopeless 3 0  PHQ - 2 Score 6 0  Altered sleeping 3 0  Tired, decreased energy 3 3  Change in appetite 1 0  Feeling bad or failure about yourself  3 0  Trouble concentrating 3 0  Moving slowly or fidgety/restless 2 0  Suicidal thoughts 2 0  PHQ-9 Score 23  3      Data saved with a previous flowsheet row definition    Scribe for Treatment Team: Jenkins LULLA Primer, LCSWA 08/19/2024 10:21 AM

## 2024-08-19 NOTE — Group Note (Signed)
 Date:  08/19/2024 Time:  11:00 AM  Group Topic/Focus:  Recreational Therapy Group    Participation Level:  Did Not Attend   Melinda Jimenez 08/19/2024, 11:00 AM

## 2024-08-19 NOTE — Group Note (Signed)
 Recreation Therapy Group Note   Group Topic:Team Building  Group Date: 08/19/2024 Start Time: 0936 End Time: 1019 Facilitators: Greg Eckrich-McCall, LRT,CTRS Location: [address] Dayroom   Group Topic: Communication, Team Building, Problem Solving  Goal Area(s) Addresses:  Patient will effectively work with peer towards shared goal.  Patient will identify skills used to make activity successful.  Patient will identify how skills used during activity can be used to reach post d/c goals.   Behavioral Response:   Intervention: STEM Activity  Activity: Landing Pad. In teams of 3-5, patients were given 12 plastic drinking straws and an equal length of masking tape. Using the materials provided, patients were asked to build a landing pad to catch a golf ball dropped from approximately 5 feet in the air. All materials were required to be used by the team in their design. LRT facilitated post-activity discussion.  Education: Pharmacist, Community, Scientist, Physiological, Discharge Planning   Education Outcome: Acknowledges education/In group clarification offered/Needs additional education.    Affect/Mood: N/A   Participation Level: Did not attend    Clinical Observations/Individualized Feedback:     Plan: Continue to engage patient in RT group sessions 2-3x/week.   Haiden Rawlinson-McCall, LRT,CTRS 08/19/2024 1:04 PM

## 2024-08-19 NOTE — Progress Notes (Signed)
 Patient given phone to access contacts to help find housing at discharge. Patient chose not to write down any numbers.

## 2024-08-19 NOTE — Group Note (Signed)
 Date:  08/19/2024 Time:  4:41 PM  Group Topic/Focus: Gut flora Making Healthy Choices:   The focus of this group is to help patients identify negative/unhealthy choices they were using prior to admission and identify positive/healthier coping strategies to replace them upon discharge.    Participation Level:  Did Not Attend  Juliene CHRISTELLA Huddle 08/19/2024, 4:41 PM

## 2024-08-19 NOTE — Progress Notes (Signed)
(  Sleep Hours) -6.75 (Any PRNs that were needed, meds refused, or side effects to meds)- none (Any disturbances and when (visitation, over night)-none (Concerns raised by the patient)- none (SI/HI/AVH)-denied

## 2024-08-19 NOTE — Group Note (Signed)
 Date:  08/19/2024 Time:  10:43 AM  Group Topic/Focus:  Goals Group:   The focus of this group is to help patients establish daily goals to achieve during treatment and discuss how the patient can incorporate goal setting into their daily lives to aide in recovery. Identifying Needs:   The focus of this group is to help patients identify their personal needs that have been historically problematic and identify healthy behaviors to address their needs. Managing Feelings:   The focus of this group is to identify what feelings patients have difficulty handling and develop a plan to handle them in a healthier way upon discharge.    Participation Level:  Did Not Attend  Participation Quality:    Affect:    Cognitive:    Insight:  Engagement in Group:    Modes of Intervention:    Additional Comments:  Patient did not attend group.  Johanna Stafford Byrd 08/19/2024, 10:43 AM

## 2024-08-19 NOTE — Plan of Care (Signed)

## 2024-08-20 MED ORDER — VITAMIN D (ERGOCALCIFEROL) 1.25 MG (50000 UNIT) PO CAPS: 50000.0000 [IU] | ORAL_CAPSULE | ORAL | 0 refills | Status: DC

## 2024-08-20 MED ORDER — ARIPIPRAZOLE 15 MG PO TABS: 15.0000 mg | ORAL_TABLET | Freq: Every day | ORAL | 0 refills | Status: DC

## 2024-08-20 MED ORDER — FERROUS SULFATE 325 (65 FE) MG PO TABS: 325.0000 mg | ORAL_TABLET | Freq: Two times a day (BID) | ORAL | 3 refills | Status: DC

## 2024-08-20 MED ORDER — IBUPROFEN 600 MG PO TABS: 600.0000 mg | ORAL_TABLET | Freq: Four times a day (QID) | ORAL | 0 refills | Status: DC | PRN

## 2024-08-20 MED ORDER — NIFEDIPINE ER 30 MG PO TB24: 30.0000 mg | ORAL_TABLET | Freq: Every day | ORAL | 0 refills | Status: DC

## 2024-08-20 MED ORDER — TRAZODONE HCL 100 MG PO TABS: 100.0000 mg | ORAL_TABLET | Freq: Every evening | ORAL | 0 refills | Status: DC | PRN

## 2024-08-20 NOTE — Group Note (Signed)
 Date:  08/20/2024 Time:  10:13 AM  Group Topic/Focus:  Goals Group:   The focus of this group is to help patients establish daily goals to achieve during treatment and discuss how the patient can incorporate goal setting into their daily lives to aide in recovery.    Participation Level:  Did Not Attend  Participation Quality:  Did Not Attend  Affect:  Did Not Attend  Cognitive:  Did Not Attend  Insight: None  Engagement in Group:  Did Not Attend  Modes of Intervention:  Did Not Attend  Additional Comments:  Did Not Attend  Avelina DELENA Humphreys 08/20/2024, 10:13 AM

## 2024-08-20 NOTE — [facility] Group Notes (Signed)
 Patient did not attend the Occupational Therapy group.

## 2024-08-20 NOTE — Progress Notes (Signed)
(  Sleep Hours) - 9.25 (Any PRNs that were needed, meds refused, or side effects to meds)- None (Any disturbances and when (visitation, over night)- None (Concerns raised by the patient)- None (SI/HI/AVH)- Denied

## 2024-08-20 NOTE — Progress Notes (Signed)
   08/20/24 0901  Psych Admission Type (Psych Patients Only)  Admission Status Voluntary  Psychosocial Assessment  Patient Complaints Isolation  Eye Contact Fair  Facial Expression Flat  Affect Preoccupied  Speech Logical/coherent  Interaction Guarded  Motor Activity Other (Comment) (wdl)  Appearance/Hygiene Unremarkable  Behavior Characteristics Cooperative  Mood Preoccupied  Aggressive Behavior  Effect No apparent injury  Thought Process  Coherency WDL  Content WDL  Delusions None reported or observed  Perception WDL  Hallucination None reported or observed  Judgment Limited  Confusion None  Danger to Self  Current suicidal ideation? Denies  Self-Injurious Behavior No self-injurious ideation or behavior indicators observed or expressed   Agreement Not to Harm Self Yes  Description of Agreement Verbal  Danger to Others  Danger to Others None reported or observed  Danger to Others Abnormal  Harmful Behavior to others No threats or harm toward other people  Destructive Behavior No threats or harm toward property

## 2024-08-20 NOTE — [facility] Group Notes (Signed)
 Patient did not attend the Social Work group.

## 2024-08-20 NOTE — Progress Notes (Signed)
 [facility] MD Progress Note  08/20/2024 2:16 PM Melinda Jimenez  MRN:  [id number]  Principal Problem: Bipolar affective disorder, depressed, severe (HCC) Diagnosis: Principal Problem:   Bipolar affective disorder, depressed, severe (HCC) Active Problems:   GAD (generalized anxiety disorder)   PTSD (post-traumatic stress disorder)   Severe episode of recurrent major depressive disorder, with psychotic features (HCC)  Reason for admission: Melinda Jimenez is a 38 y.o. female  with a past psychiatric history of MDD, GAD, bipolar 1, borderline personality disorder. Patient initially arrived to [facility] on 11/12 for increased suicidal thoughts, and admitted to [facility] on 11/14 for acute safety concerns. PMHx is significant for HTN, prediabetes, and vitamin D  deficiency.  (admitted on 08/13/2024, total  LOS: 7 days )  Chart Review: Overnight Events MAR was reviewed and patient was NOT compliant with psychotropic medications. Patient is continuing to refuse nifedipine . PRNs: None. Labs: CBC (Active).  Nursing notes: Has attended some unit group sessions, but most of not being attended.  08/20/24: Today's Assessment Notes:  On assessment today, the pt reports that their mood is better and stable, improved since admission, and stable. Denies feeling down, depressed, or sad. However, concern about accomodation after discharge, added, if no place to go, I would go to the homeless shelter. Reports that anxiety symptoms are at manageable level. Sleep is stable. Appetite is stable. Concentration is without complaint. Energy level is adequate. Denies having any suicidal thoughts. Denies having any suicidal intent and plan. Denies having any HI. Denies having psychotic symptoms. Denies having side effects to current psychiatric medications.   Discussed discharge planning: How to identify the signs of impending crisis, use of internal coping strategies, reaching out to friends and family that can help navigate a  crisis, and a list of mental health professionals and agencies to call. Further to follow up on her mental health appointments and her PCP appointments.   08/19/2024 Today's assessment notes: Patient seen and examined in her room sitting up on her bed.  Chart reviewed and findings shared with the treatment team and consult with attending psychiatrist.  Patient report for past 3 days, she has been crying and depressed due to housing stability.  Reports she expected the hospital to find her accommodation prior to being discharged, however has not had any confirmatory report with regards to housing.  Reports she was assessed by the ACT team, and the assessment went well.  Reports her mood is better today, however, rates depression as #6/10 and anxiety as #5/10, with 10 being high severity.  Housing needs shared with the LCSW to follow-up with patient for resources.  Report her sleep is stable, nursing staff report patient sleeping 6.75 hours.  Reports appetite is improving and she has more energy today.  Concentration is improving.  Patient denies SI, HI, or AVH.  Reports her goal today is to remain calm and may be will find housing prior to discharge.  Patient case discussed with the attending psychiatrist with recommendation to extend discharge date for another 2 days in the hospital due to her mood.  Abilify  increased from 10 mg to 15 mg as per attending psychiatrist recommendation.  subjective: Melinda Jimenez was seen in her room during rounds today. She describes her mood as "I've been frustrated," and reports that she has been crying and "had a breakdown." She explains, "I tend to take stuff that I would not do to other people."  She states her appetite is "okay," though she often does not eat midday  because she has breakfast and snacks. She denies any difficulties with sleep and denies suicidal or homicidal ideation. She also denies experiencing psychotic symptoms or any side effects from her psychiatric medications.  Melinda Jimenez states she refused her medication this morning because she took a dose last night. She was reminded that she previously consented to consolidating her Abilify  to a morning dose due to her concern that nighttime dosing interfered with sleep and caused headaches. She confirmed this was accurate and stated she would see the nurse to take her medication. However, chart review later showed she continued to refuse her Abilify  and therefore did not take any psychotropic medication today. Melinda Jimenez reports she does not need trazodone  because she does not have sleep difficulties. She was informed that trazodone  would be changed from nightly to nightly as needed. She reports that her assessment with the ACT Team went well yesterday. [name] states she does not feel ready for discharge today.  Past Medical History:  Past Medical History:  Diagnosis Date   Anemia    Anxiety    off meds with preg   Bipolar 1 disorder (HCC) 03/10/2024   Borderline personality disorder (HCC) 03/10/2024   Brief psychotic disorder (HCC) 04/03/2020   Carpal tunnel syndrome    Cesarean delivery delivered 10/10/2020   Chlamydia    Chronic hypertension    Chronic post-traumatic stress disorder (PTSD) 03/10/2024   COVID-19 09/12/2020   Genital HSV    Last OB: > 81yr ago; Valtrex  500 mg po prn outbreaks   GERD (gastroesophageal reflux disease)    GERD without esophagitis 01/20/2013   Gestational diabetes    diet controlled   Goiter, unspecified 04/18/2010   Centricity Description: THYROMEGALY  Qualifier: Diagnosis of   By: Lelon RIGGERS, Scott      Centricity Description: GOITER  Qualifier: Diagnosis of   By: Lelon RIGGERS, Scott      Replacing diagnoses that were inactivated after the 12/31/22 regulatory import     H/O: cesarean section 09/27/2016   last C-section for active HSV, C-section X1: 05/20/13; records in [facility]  TOLAC desired, signed 09/27/16      Headache(784.0)    Herpes genitalis in women 04/18/2010   Qualifier:  Diagnosis of   By: Lelon RIGGERS, Scott         IBS (irritable bowel syndrome)    Infection    UTI   Intrauterine pregnancy 03/22/2020   EDC 11/14/2020     Panic attack    Premature rupture of membranes (PROM), onset of labor after 24 hours, antepartum, preterm, third trimester 10/10/2020   Pulmonary embolus (HCC) 10/18/2020   Thyroid  disease    Trichomonas    Vitamin D  deficiency 03/10/2024   Family History:  Family History  Problem Relation Age of Onset   Depression Mother    Anxiety disorder Mother    Breast cancer Other    Bipolar disorder Brother    Diabetes Maternal Grandmother    Cancer Maternal Grandmother    Hypertension Maternal Grandmother    Hypertension Paternal Grandmother    Autism Other    Schizophrenia Other    Current Medications: Current Facility-Administered Medications  Medication Dose Route Frequency Provider Last Rate Last Admin   acetaminophen  (TYLENOL ) tablet 650 mg  650 mg Oral Q6H PRN Marry Clamp, MD       alum & mag hydroxide-simeth (MAALOX/MYLANTA) 200-200-20 MG/5ML suspension 30 mL  30 mL Oral Q4H PRN Marry Clamp, MD       ARIPiprazole  (ABILIFY ) tablet 15  mg  15 mg Oral Daily Ion Gonnella C, FNP   15 mg at 08/20/24 0901   haloperidol  (HALDOL ) tablet 5 mg  5 mg Oral TID PRN Marry Clamp, MD       And   diphenhydrAMINE  (BENADRYL ) capsule 50 mg  50 mg Oral TID PRN Marry Clamp, MD       haloperidol  lactate (HALDOL ) injection 5 mg  5 mg Intramuscular TID PRN Marry Clamp, MD       And   diphenhydrAMINE  (BENADRYL ) injection 50 mg  50 mg Intramuscular TID PRN Marry Clamp, MD       And   LORazepam  (ATIVAN ) injection 2 mg  2 mg Intramuscular TID PRN Marry Clamp, MD       haloperidol  lactate (HALDOL ) injection 10 mg  10 mg Intramuscular TID PRN Marry Clamp, MD       And   diphenhydrAMINE  (BENADRYL ) injection 50 mg  50 mg Intramuscular TID PRN Marry Clamp, MD       And   LORazepam  (ATIVAN ) injection 2 mg  2 mg  Intramuscular TID PRN Marry Clamp, MD       ferrous sulfate  tablet 325 mg  325 mg Oral BID WC Orabelle Rylee C, FNP   325 mg at 08/20/24 0901   hydrOXYzine  (ATARAX ) tablet 25 mg  25 mg Oral TID PRN Rhyleigh Grassel C, FNP       ibuprofen  (ADVIL ) tablet 600 mg  600 mg Oral Q6H PRN Bennett, Christal H, NP       magnesium  hydroxide (MILK OF MAGNESIA) suspension 30 mL  30 mL Oral Daily PRN Marry Clamp, MD       NIFEdipine  (PROCARDIA -XL/NIFEDICAL-XL) 24 hr tablet 30 mg  30 mg Oral Daily Arisbel Maione C, FNP       traZODone  (DESYREL ) tablet 100 mg  100 mg Oral QHS PRN Bennett, Christal H, NP       Vitamin D  (Ergocalciferol ) (DRISDOL ) 1.25 MG (50000 UNIT) capsule 50,000 Units  50,000 Units Oral Q7 days Tex Drilling, NP       Lab Results: No results found for this or any previous visit (from the past 48 hours).  Blood Alcohol level:  Lab Results  Component Value Date   [facility] <15 03/09/2024   ETH <10 11/26/2022   Metabolic Labs: Lab Results  Component Value Date   HGBA1C 6.0 (H) 08/12/2024   MPG 125.5 08/12/2024   MPG 122.63 07/28/2024   No results found for: PROLACTIN Lab Results  Component Value Date   CHOL 126 08/12/2024   TRIG 93 08/12/2024   HDL 35 (L) 08/12/2024   CHOLHDL 3.6 08/12/2024   VLDL 19 08/12/2024   LDLCALC 72 08/12/2024   LDLCALC 71 07/29/2024   Sleep:Sleep: Good Number of Hours of Sleep: 9.25  Physical Findings: AIMS: No  CIWA:    COWS:     Psychiatric Specialty Exam:  Presentation  General Appearance: Appropriate for Environment; Casual  Eye Contact: intense  Speech: clear and coherent  Speech Volume:Normal  Handedness:Right  Mood and Affect  Mood: I've been frustrated.   Affect:Congruent  Thought Process  Thought Processes:Coherent; Linear  Descriptions of Associations:Intact  Orientation:Full (Time, Place and Person)  Thought Content:Logical  History of Schizophrenia/Schizoaffective disorder:No  Duration of Psychotic Symptoms:Less  than six months  Hallucinations:Hallucinations: None  Ideas of Reference:None  Suicidal Thoughts: No  Homicidal Thoughts:Homicidal Thoughts: No  Sensorium  Memory:Immediate Fair; Recent Fair  Judgment:Fair  Insight:Fair  Executive Functions  Concentration:Good  Attention Span:Good  Recall:Fair  Fund of Knowledge:Fair  Language:Good  Psychomotor Activity  Psychomotor Activity:Psychomotor Activity: Normal  Assets  Assets:Communication Skills; Housing; Physical Health; Resilience  Sleep  Sleep:Sleep: Good Number of Hours of Sleep: 9.25  Physical Exam Vitals and nursing note reviewed.  Constitutional:      General: She is not in acute distress.    Appearance: She is not ill-appearing.  HENT:     Head: Normocephalic.     Right Ear: External ear normal.     Left Ear: External ear normal.     Nose: Nose normal.     Mouth/Throat:     Pharynx: Oropharynx is clear.  Eyes:     Extraocular Movements: Extraocular movements intact.  Cardiovascular:     Rate and Rhythm: Normal rate.     Pulses: Normal pulses.  Pulmonary:     Effort: No respiratory distress.  Musculoskeletal:        General: Normal range of motion.     Cervical back: Normal range of motion.  Skin:    General: Skin is dry.  Neurological:     General: No focal deficit present.     Mental Status: She is alert and oriented to person, place, and time.  Psychiatric:        Mood and Affect: Mood normal.        Behavior: Behavior normal.    Review of Systems  Constitutional:  Negative for chills and fever.  HENT:  Negative for sore throat.   Eyes:  Negative for blurred vision.  Respiratory:  Negative for cough, sputum production, shortness of breath and wheezing.   Cardiovascular:  Negative for chest pain and palpitations.  Gastrointestinal:  Negative for abdominal pain, constipation, diarrhea, heartburn, nausea and vomiting.  Genitourinary:  Negative for dysuria.  Musculoskeletal:  Negative for  falls.  Skin:  Negative for itching and rash.  Neurological:  Negative for dizziness and headaches.  Endo/Heme/Allergies: Negative.   Psychiatric/Behavioral:  Positive for depression. Negative for hallucinations, memory loss, substance abuse and suicidal ideas. The patient is nervous/anxious. The patient does not have insomnia.   All other systems reviewed and are negative.   Physical Exam Constitutional:      Appearance: the patient is not toxic-appearing.  Pulmonary:     Effort: Pulmonary effort is normal.  Neurological:     General: No focal deficit present.     Mental Status: the patient is alert and oriented to person, place, and time.   Review of Systems  Respiratory:  Negative for shortness of breath.   Cardiovascular:  Negative for chest pain.  Gastrointestinal:  Negative for abdominal pain, constipation, diarrhea, nausea and vomiting.  Neurological:  Negative for headaches.   Blood pressure 119/86, pulse (!) 103, temperature 97.6 F (36.4 C), temperature source Oral, resp. rate 16, height 5' 7 (1.702 m), weight 100.9 kg, SpO2 100%. Body mass index is 34.83 kg/m.  Treatment Plan Summary: Daily contact with patient to assess and evaluate symptoms and progress in treatment and Medication management  11/18: Today, nursing staff report the patient went down to the cafeteria for breakfast this morning, which is not typical for her, and that she refused trazodone  the previous night. Nursing notes indicate she is not routinely attending unit groups but briefly entered the pet therapy group today. Her medication noncompliance continues, despite education regarding the importance of adherence for mood stabilization. She denies symptoms of psychosis, though her mood appears labile. Continuation of her current psychiatric treatment plan is indicated. A long-acting  injectable antipsychotic was strongly recommended prior to discharge; however, the patient declined despite encouragement. She  denies any dyskinesia or EPS type symptoms. Abilify  consolidation to a morning dose has been implemented, though the patient continues to refuse the medication. Per social work documentation, she completed an initial assessment with [facility] and agreed to services. She continues to refuse Procardia ; vital signs reviewed this morning show a blood pressure of 124/84.    Safety and Monitoring: Voluntary admission to inpatient psychiatric unit for safety, stabilization and treatment Daily contact with patient to assess and evaluate symptoms and progress in treatment Patient's case to be discussed in multi-disciplinary team meeting Observation Level : q15 minute checks Vital signs: q12 hours Precautions: Safety  Long Term Goal(s): Improvement in symptoms so as ready for discharge  Short Term Goals: Ability to identify changes in lifestyle to reduce recurrence of condition will improve, Ability to verbalize feelings will improve, Ability to disclose and discuss suicidal ideas, Ability to demonstrate self-control will improve, Ability to identify and develop effective coping behaviors will improve, Ability to maintain clinical measurements within normal limits will improve, Compliance with prescribed medications will improve, and Ability to identify triggers associated with substance abuse/mental health issues will improve  Diagnoses Principal Problem:   Bipolar affective disorder, depressed, severe (HCC) Active Problems:   GAD (generalized anxiety disorder)   PTSD (post-traumatic stress disorder)   Severe episode of recurrent major depressive disorder, with psychotic features (HCC)  Medications: - Continue Abilify  10 mg daily for mood stabilization - Reschedule Trazodone  100 mg to nightly as needed for sleep  - Continue Vitamin D  50,000 units weekly for low Vit D levels - Discontinue Nicoderm Cq  21 mg due to consecutive refusals 11/16 - Continue Nifedipine  XR 30 mg daily for  HTN  PRNS -Continue Tylenol  650 mg every 6 hours PRN for mild pain -Continue Maalox 30 mg every 4 hrs PRN for indigestion -Continue Milk of Magnesia as needed every 6 hrs for constipation  Discharge Planning: Social work and case management to assist with discharge planning and identification of hospital follow-up needs prior to discharge Estimated LOS: 5-7 days Discharge Concerns: Need to establish a safety plan; Medication compliance and effectiveness Discharge Goals: Return home with outpatient referrals for mental health follow-up including medication management/psychotherapy  I certify that inpatient services furnished can reasonably be expected to improve the patient's condition.    Total Time Spent in Direct Patient Care: 45 minutes  Ellouise JAYSON Azure, FNP 11/20/20252:16 PM     11/20/20252:16 PM Patient ID: Melinda Jimenez, female   DOB: 10-Jan-1986, 38 y.o.   MRN: [id number] Patient ID: Melinda Jimenez, female   DOB: October 18, 1985, 38 y.o.   MRN: [id number] Patient ID: Melinda Jimenez, female   DOB: 03/07/1986, 38 y.o.   MRN: [id number] Patient ID: Melinda Jimenez, female   DOB: September 20, 1986, 38 y.o.   MRN: [id number] Patient ID: Melinda Jimenez, female   DOB: [birth year], 38 y.o.   MRN: [id number]

## 2024-08-20 NOTE — Plan of Care (Signed)
   Problem: Education: Goal: Knowledge of Melinda Jimenez General Education information/materials will improve Outcome: Progressing Goal: Emotional status will improve Outcome: Progressing Goal: Mental status will improve Outcome: Progressing Goal: Verbalization of understanding the information provided will improve Outcome: Progressing   Problem: Activity: Goal: Interest or engagement in activities will improve Outcome: Progressing Goal: Sleeping patterns will improve Outcome: Progressing   Problem: Coping: Goal: Ability to verbalize frustrations and anger appropriately will improve Outcome: Progressing Goal: Ability to demonstrate self-control will improve Outcome: Progressing

## 2024-08-20 NOTE — Progress Notes (Signed)
 Melinda Jimenez   Type of Note: Housing Resources  Pt was given extensive list of housing resources including low-income housing, budget-friendly hotels/motels, and shelter list per her request.   Signed:  Cedrick Partain, LCSW-A 08/20/2024  9:49 AM

## 2024-08-20 NOTE — [facility] Group Notes (Signed)
 [facility] Group Notes:  (Nursing/MHT/Case Management/Adjunct)  Date:  08/20/2024  Time:  9:44 PM  Type of Therapy:  Wrap up group  Participation Level:  Did Not Attend  Participation Quality:    Affect:    Cognitive:    Insight:    Engagement in Group:    Modes of Intervention:    Summary of Progress/Problems:  Melinda Jimenez 08/20/2024, 9:44 PM

## 2024-08-20 NOTE — Discharge Summary (Signed)
 Physician Discharge Summary Note  Patient:  Melinda Jimenez is an 38 y.o., female MRN:  [id number] DOB:  11-05-1985 Patient phone:  [phone] (home)  Patient address:   [place] [address],  Total Time spent with patient: 45 minutes  Date of Admission:  08/13/2024 Date of Discharge:   08/21/2024  Reason for Admission:  Melinda Jimenez is a 38 year old African-American female who was recently discharged from [facility] on 07/30/2024. Present with past psychiatric history significant for major depressive disorder, recurrent, episode severe generalized anxiety, disorder bipolar 1 disorder severe, borderline personality disorder.PMHx include hypertension, prediabetes, and vitamin D  deficiency. Patient presents voluntarily to [facility] from [facility] behavioral health urgent care for worsening depression resulting in suicidal ideation without any plans or intent in the context of psychosocial stressors.   Principal Problem: Bipolar affective disorder, depressed, severe (HCC) Discharge Diagnoses: Principal Problem:   Bipolar affective disorder, depressed, severe (HCC) Active Problems:   GAD (generalized anxiety disorder)   PTSD (post-traumatic stress disorder)   Severe episode of recurrent major depressive disorder, with psychotic features (HCC)  Past Psychiatric History: Previous Psych Diagnoses: MDD, bipolar disorder, and generalized anxiety. Prior inpatient treatment: X 2, most recently at [facility] in June 2025 Current/prior outpatient treatment: Denies Prior rehab hx: Denies Psychotherapy hx: Yes History of suicide: Denies History of homicide or aggression: Denies Psychiatric medication history: Yes, Abilify  trazodone  and hydroxyzine  Psychiatric medication compliance history: Noncompliance Neuromodulation history: denies Current Psychiatrist: Denies Current therapist: Denies  Past Medical History:  Past Medical History:  Diagnosis Date   Anemia     Anxiety    off meds with preg   Bipolar 1 disorder (HCC) 03/10/2024   Borderline personality disorder (HCC) 03/10/2024   Brief psychotic disorder (HCC) 04/03/2020   Carpal tunnel syndrome    Cesarean delivery delivered 10/10/2020   Chlamydia    Chronic hypertension    Chronic post-traumatic stress disorder (PTSD) 03/10/2024   COVID-19 09/12/2020   Genital HSV    Last OB: > 68yr ago; Valtrex  500 mg po prn outbreaks   GERD (gastroesophageal reflux disease)    GERD without esophagitis 01/20/2013   Gestational diabetes    diet controlled   Goiter, unspecified 04/18/2010   Centricity Description: THYROMEGALY  Qualifier: Diagnosis of   By: Lelon RIGGERS, Scott      Centricity Description: GOITER  Qualifier: Diagnosis of   By: Lelon RIGGERS, Scott      Replacing diagnoses that were inactivated after the 12/31/22 regulatory import     H/O: cesarean section 09/27/2016   last C-section for active HSV, C-section X1: 05/20/13; records in epic [facility]  TOLAC desired, signed 09/27/16      Headache(784.0)    Herpes genitalis in women 04/18/2010   Qualifier: Diagnosis of   By: Lelon RIGGERS, Scott         IBS (irritable bowel syndrome)    Infection    UTI   Intrauterine pregnancy 03/22/2020   EDC 11/14/2020     Panic attack    Premature rupture of membranes (PROM), onset of labor after 24 hours, antepartum, preterm, third trimester 10/10/2020   Pulmonary embolus (HCC) 10/18/2020   Thyroid  disease    Trichomonas    Vitamin D  deficiency 03/10/2024    Past Surgical History:  Procedure Laterality Date   CESAREAN SECTION N/A 05/20/2013   Procedure: CESAREAN SECTION;  Surgeon: Carlin DELENA Centers, MD;  Location: [facility] ORS;  Service: Obstetrics;  Laterality: N/A;   CESAREAN SECTION N/A 04/06/2017  Procedure: CESAREAN SECTION;  Surgeon: Lorence Ozell CROME, MD;  Location: [facility] BIRTHING SUITES;  Service: Obstetrics;  Laterality: N/A;   CESAREAN SECTION  10/10/2020   Procedure: CESAREAN SECTION;  Surgeon: Zina Jerilynn LABOR,  MD;  Location: [facility] LD ORS;  Service: Obstetrics;;   INDUCED ABORTION  2009   VAGINAL DELIVERY  2007   Family History:  Family History  Problem Relation Age of Onset   Depression Mother    Anxiety disorder Mother    Breast cancer Other    Bipolar disorder Brother    Diabetes Maternal Grandmother    Cancer Maternal Grandmother    Hypertension Maternal Grandmother    Hypertension Paternal Grandmother    Autism Other    Schizophrenia Other    Family Psychiatric  History: See H&P Social History:  Social History   Substance and Sexual Activity  Alcohol Use No   Comment: Twice a month     Social History   Substance and Sexual Activity  Drug Use No    Social History   Socioeconomic History   Marital status: Single    Spouse name: Not on file   Number of children: 1   Years of education: Not on file   Highest education level: Not on file  Occupational History   Occupation: Unemployed  Tobacco Use   Smoking status: Passive Smoke Exposure - Never Smoker   Smokeless tobacco: Never   Tobacco comments:    was not a daily smoker, tried it  Advertising Account Planner   Vaping status: Never Used  Substance and Sexual Activity   Alcohol use: No    Comment: Twice a month   Drug use: No   Sexual activity: Yes    Birth control/protection: None    Comment: Patient is interested in implant  Other Topics Concern   Not on file  Social History Narrative   Not on file   Social Drivers of Health   Financial Resource Strain: Not on file  Food Insecurity: Food Insecurity Present (08/13/2024)   Hunger Vital Sign    Worried About Running Out of Food in the Last Year: Sometimes true    Ran Out of Food in the Last Year: Sometimes true  Transportation Needs: Patient Declined (08/13/2024)   PRAPARE - Administrator, Civil Service (Medical): Patient declined    Lack of Transportation (Non-Medical): Patient declined  Physical Activity: Not on file  Stress: Not on file  Social  Connections: Unknown (02/05/2022)   Received from [facility]   Social Network    Social Network: Not on file   Hospital Course:  During the patient's hospitalization, patient had extensive initial psychiatric evaluation, and follow-up psychiatric evaluations every day.  Psychiatric diagnoses provided upon initial assessment:  Bipolar 1 disorder severe Generalized anxiety disorder Borderline personality disorder PTSD  Patient's psychiatric medications were adjusted on admission:  --Resume Prozac  10 mg tablet p.o. daily bedtime for depression --Resume Abilify  5 mg p.o. daily at bedtime for depression and paranoia --Continue hydroxyzine  25 mg tablet p.o. 3 times daily as needed for anxiety --Continue trazodone  tablet 50 mg p.o. q. nightly as needed for insomnia  During the hospitalization, other adjustments were made to the patient's psychiatric medication regimen:  Prozac  discontinued due to enhanced mood. Abilify  increased to 15 mg p.o. daily at bedtime for depression and paranoia  Patient's care was discussed during the interdisciplinary team meeting every day during the hospitalization.  The patient denies having side effects to prescribed psychiatric medication.  Gradually, patient started adjusting to milieu. The patient was evaluated each day by a clinical provider to ascertain response to treatment. Improvement was noted by the patient's report of decreasing symptoms, improved sleep and appetite, affect, medication tolerance, behavior, and participation in unit programming.  Patient was asked each day to complete a self inventory noting mood, mental status, pain, new symptoms, anxiety and concerns.    Symptoms were reported as significantly decreased or resolved completely by discharge.   On day of discharge, the patient reports that their mood is stable. The patient denied having suicidal thoughts for more than 48 hours prior to discharge.  Patient denies having homicidal  thoughts.  Patient denies having auditory hallucinations.  Patient denies any visual hallucinations or other symptoms of psychosis. The patient was motivated to continue taking medication with a goal of continued improvement in mental health.   The patient reports their target psychiatric symptoms of manic depressive episode responded well to the psychiatric medications, and the patient reports overall benefit other psychiatric hospitalization. Supportive psychotherapy was provided to the patient. The patient also participated in regular group therapy while hospitalized. Coping skills, problem solving as well as relaxation therapies were also part of the unit programming.  Labs were reviewed with the patient, and abnormal results were discussed with the patient.  The patient is able to verbalize their individual safety plan to this provider.  # It is recommended to the patient to continue psychiatric medications as prescribed, after discharge from the hospital.    # It is recommended to the patient to follow up with your outpatient psychiatric provider and PCP.  # It was discussed with the patient, the impact of alcohol, drugs, tobacco have been there overall psychiatric and medical wellbeing, and total abstinence from substance use was recommended the patient.ed.  # Prescriptions provided or sent directly to preferred pharmacy at discharge. Patient agreeable to plan. Given opportunity to ask questions. Appears to feel comfortable with discharge.    # In the event of worsening symptoms, the patient is instructed to call the crisis hotline, 911 and or go to the nearest ED for appropriate evaluation and treatment of symptoms. To follow-up with primary care provider for other medical issues, concerns and or health care needs  # Patient was discharged [facility] homeless shelter with a plan to follow up as noted below.   Physical Findings: AIMS:  , ,  ,  ,  ,  ,   CIWA:    COWS:      Musculoskeletal: Strength & Muscle Tone: within normal limits Gait & Station: normal Patient leans: N/A  Psychiatric Specialty Exam:  Presentation  General Appearance:  Appropriate for Environment; Casual  Eye Contact: Good  Speech: Clear and Coherent  Speech Volume: Normal  Handedness: Right  Mood and Affect  Mood: Anxious; Depressed  Affect: Congruent  Thought Process  Thought Processes: Coherent; Linear  Descriptions of Associations:Intact  Orientation:Full (Time, Place and Person)  Thought Content:Logical  History of Schizophrenia/Schizoaffective disorder:No  Duration of Psychotic Symptoms:Less than six months  Hallucinations:Hallucinations: None  Ideas of Reference:None  Suicidal Thoughts:Suicidal Thoughts: No SI Active Intent and/or Plan: -- (Denies) SI Passive Intent and/or Plan: -- (Denies)  Homicidal Thoughts:Homicidal Thoughts: No  Sensorium  Memory: Immediate Fair; Recent Fair  Judgment: Fair  Insight: Fair  Executive Functions  Concentration: Good  Attention Span: Good  Recall: Fair  Fund of Knowledge: Fair  Language: Good  Psychomotor Activity  Psychomotor Activity: Psychomotor Activity: Normal  Assets  Assets:  Manufacturing Systems Engineer; Housing; Physical Health; Resilience  Sleep  Sleep: Sleep: Good  Estimated Sleeping Duration (Last 24 Hours): 6.75-7.00 hours (Due to Daylight Saving Time, the durations displayed may not accurately represent documentation during the time change interval)  Physical Exam: Physical Exam Vitals reviewed.  Constitutional:      General: She is not in acute distress.    Appearance: She is obese. She is not ill-appearing.  HENT:     Head: Normocephalic.     Right Ear: External ear normal.     Left Ear: External ear normal.     Nose: Nose normal.     Mouth/Throat:     Mouth: Mucous membranes are moist.     Pharynx: Oropharynx is clear.  Eyes:     Extraocular Movements:  Extraocular movements intact.  Cardiovascular:     Rate and Rhythm: Normal rate.     Pulses: Normal pulses.  Pulmonary:     Effort: Pulmonary effort is normal. No respiratory distress.  Musculoskeletal:        General: Normal range of motion.     Cervical back: Normal range of motion.  Skin:    General: Skin is dry.  Neurological:     General: No focal deficit present.     Mental Status: She is alert and oriented to person, place, and time.  Psychiatric:        Mood and Affect: Mood normal.        Behavior: Behavior normal.        Thought Content: Thought content normal.    Review of Systems  Constitutional:  Negative for chills and fever.  HENT:  Negative for sore throat.   Eyes:  Negative for blurred vision.  Respiratory:  Negative for cough, sputum production, shortness of breath and wheezing.   Cardiovascular:  Negative for chest pain and palpitations.  Gastrointestinal:  Negative for abdominal pain, constipation, diarrhea, heartburn, nausea and vomiting.  Genitourinary:  Negative for dysuria and urgency.  Musculoskeletal:  Negative for falls.  Skin:  Negative for itching and rash.  Neurological:  Negative for dizziness and headaches.  Endo/Heme/Allergies: Negative.   Psychiatric/Behavioral:  Positive for depression (Stable with medication). Negative for hallucinations, substance abuse and suicidal ideas. The patient is nervous/anxious (Improved with medication). The patient does not have insomnia.    Blood pressure 119/86, pulse (!) 103, temperature 97.6 F (36.4 C), temperature source Oral, resp. rate 16, height 5' 7 (1.702 m), weight 100.9 kg, SpO2 100%. Body mass index is 34.83 kg/m.  Social History   Tobacco Use  Smoking Status Passive Smoke Exposure - Never Smoker  Smokeless Tobacco Never  Tobacco Comments   was not a daily smoker, tried it   Tobacco Cessation:  N/A, patient does not currently use tobacco products  Blood Alcohol level:  Lab Results   Component Value Date   [facility] <15 03/09/2024   ETH <10 11/26/2022   Metabolic Disorder Labs:  Lab Results  Component Value Date   HGBA1C 6.0 (H) 08/12/2024   MPG 125.5 08/12/2024   MPG 122.63 07/28/2024   No results found for: PROLACTIN Lab Results  Component Value Date   CHOL 126 08/12/2024   TRIG 93 08/12/2024   HDL 35 (L) 08/12/2024   CHOLHDL 3.6 08/12/2024   VLDL 19 08/12/2024   LDLCALC 72 08/12/2024   LDLCALC 71 07/29/2024    See Psychiatric Specialty Exam and Suicide Risk Assessment completed by Attending Physician prior to discharge.  Discharge destination:  Other:  [facility]  homeless shelter  Is patient on multiple antipsychotic therapies at discharge:  No   Has Patient had three or more failed trials of antipsychotic monotherapy by history:  No  Recommended Plan for Multiple Antipsychotic Therapies: NA  Discharge Instructions     Increase activity slowly   Complete by: As directed       Allergies as of 08/20/2024       Reactions   Latex Itching   Zithromax  [azithromycin ] Hives, Nausea And Vomiting   Zofran  [ondansetron ] Nausea Only        Medication List     TAKE these medications      Indication  ARIPiprazole  15 MG tablet Commonly known as: ABILIFY  Take 1 tablet (15 mg total) by mouth daily. Start taking on: August 21, 2024  Indication: Major Depressive Disorder, Manic Phase of Manic-Depression, mood stabilization   ferrous sulfate  325 (65 FE) MG tablet Take 1 tablet (325 mg total) by mouth 2 (two) times daily with a meal.  Indication: Anemia From Inadequate Iron in the Body   hydrOXYzine  25 MG tablet Commonly known as: ATARAX  Take 1 tablet (25 mg total) by mouth 3 (three) times daily as needed for anxiety.  Indication: Feeling Anxious   ibuprofen  600 MG tablet Commonly known as: ADVIL  Take 1 tablet (600 mg total) by mouth every 6 (six) hours as needed for moderate pain (pain score 4-6).  Indication: Pain   NIFEdipine  30 MG 24 hr  tablet Commonly known as: ADALAT  CC Take 1 tablet (30 mg total) by mouth daily. Start taking on: August 21, 2024  Indication: High Blood Pressure   traZODone  100 MG tablet Commonly known as: DESYREL  Take 1 tablet (100 mg total) by mouth at bedtime as needed for sleep. What changed:  medication strength how much to take  Indication: Trouble Sleeping   Vitamin D  (Ergocalciferol ) 1.25 MG (50000 UNIT) Caps capsule Commonly known as: DRISDOL  Take 1 capsule (50,000 Units total) by mouth every 7 (seven) days. Start taking on: August 23, 2024  Indication: Vitamin D  Deficiency        Follow-up Information     [facility] Follow up.   Why: You may also call this provider to obtain therapy and medication management services.  Appointments are Virtual, telehealth. Contact information: [address]  [address] [place] [address] [phone]         [facility] Follow up.   Why: You have been accepted for services with this ACT team. You will receive therapy and medication management through this agency. Contact information: [address] [place] [address] [phone]                Follow-up recommendations:   Discharge Recommendations:    The patient is being discharged with her family.   Patient is to take her discharge medications as ordered. ?See follow up above.   We recommend that she participates in individual therapy to target uncontrollable agitation and substance abuse.    We recommend that she participates in therapy to target the conflict with her family, to improve communication skills and conflict resolution skills. ?patient is to initiate/implement a contingency based behavioral model to address patient's behavior.   We recommend that she gets AIMS scale, height, weight, blood pressure, fasting lipid panel, fasting blood sugar in three months from discharge if she's on atypical antipsychotics.    Patient will benefit  from monitoring of recurrent suicidal ideation since patient is on antidepressant medication.  The patient should abstain from all illicit substances and alcohol.   If the patient's symptoms worsen or do not continue to improve or if the patient becomes actively suicidal or homicidal then it is recommended that the patient return to the closest hospital emergency room or call 911 for further evaluation and treatment. National Suicide Prevention Lifeline 1800-SUICIDE or 336-871-3647.   Please follow up with your primary medical doctor for all other medical needs.    The patient has been educated on the possible side effects to medications and she/her guardian is to contact a medical professional and inform outpatient provider of any new side effects of medication.   She is to take regular diet and activity as tolerated. ?Will benefit from moderate daily exercise.   Patient was educated about removing/locking any firearms, medications or dangerous products from the home.   Activity:  As tolerated   Diet:  Regular Diet   Signed: Ellouise JAYSON Azure, FNP 08/21/24, 9:00 AM

## 2024-08-20 NOTE — [facility] Group Notes (Signed)
 Samaiya did not attend wrap up group

## 2024-08-20 NOTE — Group Note (Signed)
 LCSW Group Therapy Note   Group Date: 08/20/2024 Start Time: 1100 End Time: 1200   Participation:  did not attend  Type of Therapy:  Group Therapy  Topic:  Healing Hearts:  A Safe Space for Grief  Objective:  To create a compassionate group space where participants can process grief, learn about its stages, and explore personal rituals to honor lost loved ones--using supportive elements from CBT, DBT, and group therapy.  Goals:  1. Provide a safe and supportive space where participants feel comfortable sharing their feelings and experiences of grief without judgment.  2. Educate participants about the stages of grief and emphasize that there is no right way to grieve or a fixed timeline for healing.  3. Introduce the concept of rituals to process grief, allowing individuals to honor their loved ones in a personal and meaningful way.  Summary:  In Healing Hearts: A Safe Space for Grief, participants explored the unique and personal nature of grief, with a focus on the five stages (denial, anger, bargaining, depression, acceptance) as a flexible, non-linear process. The group introduced meaningful rituals--like lighting candles or memory walks--as tools for honoring loved ones and managing difficult emotions. Through shared experiences, participants received emotional support, practiced self-care, and reflected on gratitude, emphasizing that healing has no fixed timeline.  Therapeutic Modalities Used: - Cognitive Behavioral Therapy (CBT):   Psychoeducation on grief stages, Challenging unhelpful thoughts (e.g., "I should be over this") -  Dialectical Behavior Therapy (DBT):  Mindfulness (staying present with grief-related emotions), Distress tolerance (using rituals as grounding tools) -  Group Therapy/Supportive Elements:  Emotional validation and peer support, Encouragement of open sharing in a nonjudgmental space   Melinda Jimenez, LCSWA 08/20/2024  12:34 PM

## 2024-08-20 NOTE — Group Note (Signed)
 Recreation Therapy Group Note   Group Topic:Other  Group Date: 08/20/2024 Start Time: 1308 End Time: 1352 Facilitators: Alvina Strother-McCall, LRT,CTRS Location: [address] Dayroom   Activity Description/Intervention: Therapeutic Drumming. Patients with peers and staff were given the opportunity to engage in a leader facilitated [facility] Group Empowerment Drumming Circle with staff from the [employer], in partnership with [employer]. Teaching laboratory technician and trained [place], Norleen Mon leading with LRT observing and documenting intervention and pt response. This evidenced-based practice targets 7 areas of health and wellbeing in the human experience including: stress-reduction, exercise, self-expression, camaraderie/support, nurturing, spirituality, and music-making (leisure).   Goal Area(s) Addresses:  Patient will engage in pro-social way in music group.  Patient will follow directions of drum leader on the first prompt. Patient will demonstrate no behavioral issues during group.  Patient will identify if a reduction in stress level occurs as a result of participation in therapeutic drum circle.    Education: Leisure exposure, Pharmacologist, Musical expression, Discharge Planning   Affect/Mood: N/A   Participation Level: Did not attend    Clinical Observations/Individualized Feedback:      Plan: Continue to engage patient in RT group sessions 2-3x/week.   Aljean Horiuchi-McCall, LRT,CTRS 08/20/2024 3:07 PM

## 2024-08-20 NOTE — [facility] Group Notes (Signed)
 Patient did not attend the Music Therapy group.
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
## 2024-08-21 NOTE — Progress Notes (Signed)
 D: Pt A & O X 3. Denies SI, HI, AVH and pain at this time. D/C home as ordered. Taxi cab voucher given for transportation.  A: D/C instructions reviewed with pt including prescriptions, medication samples and follow up appointments; compliance encouraged. All belongings from locker 12 and bag behind secured door in Search room returned to pt at time of departure. Scheduled medications given with verbal education and effects monitored. Safety checks maintained without incident till time of d/c.  R: Pt receptive to care. Compliant with medications when offered. Denies adverse drug reactions when assessed. Verbalized understanding related to d/c instructions. Signed belonging sheet in agreement with items received from locker. Ambulatory with a steady gait. Appears to be in no physical distress at time of departure.

## 2024-08-21 NOTE — Progress Notes (Addendum)
  [facility] Adult Case Management Discharge Plan :  Will you be returning to the same living situation after discharge:  No. Pt discharging to [facility]. At discharge, do you have transportation home?: Yes,  CSW arranged [employer] taxi to the [facility] @ 10:15 AM. Do you have the ability to pay for your medications: Yes,  Pt has active health insurance coverage.  Pt was also given samples at discharge.  Release of information consent forms completed and in the chart;  Patient's signature needed at discharge.  Patient to Follow up at:  Follow-up Information     [facility] Follow up.   Why: You may also call this provider to obtain therapy and medication management services.  Appointments are Virtual, telehealth. Contact information: [address]  [address] [place] [address] [phone]         [facility] Follow up.   Why: You have been accepted for services with this ACT team. You will receive therapy and medication management through this agency. Contact information: [address] [place] [address] [phone]                 Next level of care provider has access to [facility] Link:no  Safety Planning and Suicide Prevention discussed: Yes,  Discussed with father,Ricky Altamirano [phone].       Has patient been referred to the Quitline?: Patient does not use tobacco/nicotine  products  Patient has been referred for addiction treatment: No known substance use disorder.  Derick JONELLE Blanch, LCSW 08/21/2024, 9:15 AM

## 2024-08-21 NOTE — BHH Group Notes (Signed)
 Adult Psychoeducational Group Note  Date:  08/21/2024 Time:    Group Topic/Focus:Goals Group    Participation Level:  Did Not Attend  Participation Quality:

## 2024-08-21 NOTE — Progress Notes (Signed)
(  Sleep Hours) -2.75  (Any PRNs that were needed, meds refused, or side effects to meds)- none  (Any disturbances and when (visitation, over night)-none  (Concerns raised by the patient)- none  (SI/HI/AVH)-denied

## 2024-08-21 NOTE — Plan of Care (Signed)
   Problem: Education: Goal: Emotional status will improve Outcome: Progressing Goal: Mental status will improve Outcome: Progressing Goal: Verbalization of understanding the information provided will improve Outcome: Progressing

## 2024-08-21 NOTE — Transportation (Signed)
 08/21/2024  Melinda Jimenez DOB: 05/26/1986 MRN: [id number]   RIDER WAIVER AND RELEASE OF LIABILITY  For the purposes of helping with transportation needs, [facility] partners with outside transportation providers (taxi companies, [place], catering manager.) to give [facility] patients or other approved people the choice of on-demand rides Public Librarian) to our buildings for non-emergency visits.  By using [employer], I, the person signing this document, on behalf of myself and/or any legal minors (in my care using the [employer]), agree:  Science Writer given to me are supplied by independent, outside transportation providers who do not work for, or have any affiliation with, [employer]. [facility] is not a transportation company. [facility] has no control over the quality or safety of the rides I get using [employer]. [facility] has no control over whether any outside ride will happen on time or not. [facility] gives no guarantee on the reliability, quality, safety, or availability on any rides, or that no mistakes will happen. I know and accept that traveling by vehicle (car, truck, SVU, fleeta, bus, taxi, etc.) has risks of serious injuries such as disability, being paralyzed, and death. I know and agree the risk of using [employer] is mine alone, and not [employer]. [employer] are provided as is and as are available. The transportation providers are in charge for all inspections and care of the vehicles used to provide these rides. I agree not to take legal action against [facility], its agents, employees, officers, directors, representatives, insurers, attorneys, assigns, successors, subsidiaries, and affiliates at any time for any reasons related directly or indirectly to using [employer]. I also agree not to take legal action against [facility] or its affiliates for any injury, death, or damage to property caused by or related to using  [employer]. I have read this Waiver and Release of Liability, and I understand the terms used in it and their legal meaning. This Waiver is freely and voluntarily given with the understanding that my right (or any legal minors) to legal action against  relating to [employer] is knowingly given up to use these services.   I attest that I read the Ride Waiver and Release of Liability to Melinda Jimenez, gave Ms. Lora the opportunity to ask questions and answered the questions asked (if any). I affirm that Melinda Jimenez then provided consent for assistance with transportation.

## 2024-08-26 NOTE — Progress Notes (Signed)

## 2024-09-02 ENCOUNTER — Other Ambulatory Visit: Payer: Self-pay

## 2024-09-02 ENCOUNTER — Encounter (HOSPITAL_COMMUNITY): Payer: Self-pay

## 2024-09-02 ENCOUNTER — Ambulatory Visit (HOSPITAL_COMMUNITY)
Admission: EM | Admit: 2024-09-02 | Discharge: 2024-09-03 | Disposition: A | Discharge status: Home / Self Care | Payer: MEDICAID | Attending: Psychiatry | Admitting: Psychiatry

## 2024-09-02 ENCOUNTER — Emergency Department (HOSPITAL_COMMUNITY)
Admission: EM | Admit: 2024-09-02 | Discharge: 2024-09-02 | Disposition: A | Discharge status: Home / Self Care | Payer: MEDICAID | Attending: Emergency Medicine | Admitting: Emergency Medicine

## 2024-09-02 DIAGNOSIS — Z91040 Latex allergy status: Secondary | ICD-10-CM | POA: Insufficient documentation

## 2024-09-02 DIAGNOSIS — F603 Borderline personality disorder: Secondary | ICD-10-CM | POA: Diagnosis not present

## 2024-09-02 DIAGNOSIS — F4325 Adjustment disorder with mixed disturbance of emotions and conduct: Secondary | ICD-10-CM | POA: Diagnosis present

## 2024-09-02 DIAGNOSIS — R45851 Suicidal ideations: Secondary | ICD-10-CM

## 2024-09-02 DIAGNOSIS — J40 Bronchitis, not specified as acute or chronic: Secondary | ICD-10-CM | POA: Diagnosis present

## 2024-09-02 DIAGNOSIS — F411 Generalized anxiety disorder: Secondary | ICD-10-CM | POA: Diagnosis not present

## 2024-09-02 DIAGNOSIS — I10 Essential (primary) hypertension: Secondary | ICD-10-CM | POA: Diagnosis not present

## 2024-09-02 DIAGNOSIS — Z8616 Personal history of COVID-19: Secondary | ICD-10-CM | POA: Diagnosis not present

## 2024-09-02 DIAGNOSIS — Z5900 Homelessness unspecified: Secondary | ICD-10-CM | POA: Diagnosis not present

## 2024-09-02 DIAGNOSIS — F3162 Bipolar disorder, current episode mixed, moderate: Secondary | ICD-10-CM

## 2024-09-02 DIAGNOSIS — J22 Unspecified acute lower respiratory infection: Secondary | ICD-10-CM

## 2024-09-02 DIAGNOSIS — F319 Bipolar disorder, unspecified: Secondary | ICD-10-CM | POA: Diagnosis not present

## 2024-09-02 DIAGNOSIS — Z79899 Other long term (current) drug therapy: Secondary | ICD-10-CM | POA: Diagnosis not present

## 2024-09-02 DIAGNOSIS — Z91148 Patient's other noncompliance with medication regimen for other reason: Secondary | ICD-10-CM | POA: Diagnosis not present

## 2024-09-02 LAB — POCT URINE DRUG SCREEN - MANUAL ENTRY (I-SCREEN)
POC Amphetamine UR: NOT DETECTED
POC Buprenorphine (BUP): NOT DETECTED
POC Cocaine UR: NOT DETECTED
POC Marijuana UR: NOT DETECTED
POC Methadone UR: NOT DETECTED
POC Methamphetamine UR: NOT DETECTED
POC Morphine: NOT DETECTED
POC Oxazepam (BZO): NOT DETECTED
POC Oxycodone UR: NOT DETECTED
POC Secobarbital (BAR): NOT DETECTED

## 2024-09-02 LAB — RESP PANEL BY RT-PCR (RSV, FLU A&B, COVID)  RVPGX2
Influenza A by PCR: NEGATIVE
Influenza B by PCR: NEGATIVE
Resp Syncytial Virus by PCR: NEGATIVE
SARS Coronavirus 2 by RT PCR: NEGATIVE

## 2024-09-02 LAB — POC URINE PREG, ED: Preg Test, Ur: NEGATIVE

## 2024-09-02 MED ORDER — DIPHENHYDRAMINE HCL 50 MG/ML IJ SOLN: 50.0000 mg | Freq: Three times a day (TID) | INTRAMUSCULAR | Status: DC | PRN

## 2024-09-02 MED ORDER — HALOPERIDOL LACTATE 5 MG/ML IJ SOLN: 5.0000 mg | Freq: Three times a day (TID) | INTRAMUSCULAR | Status: DC | PRN

## 2024-09-02 MED ORDER — ALBUTEROL SULFATE HFA 108 (90 BASE) MCG/ACT IN AERS
2.0000 | INHALATION_SPRAY | RESPIRATORY_TRACT | Status: DC | PRN
  Filled 2024-09-02: qty 6.7

## 2024-09-02 MED ORDER — DIPHENHYDRAMINE HCL 50 MG PO CAPS: 50.0000 mg | ORAL_CAPSULE | Freq: Three times a day (TID) | ORAL | Status: DC | PRN

## 2024-09-02 MED ORDER — ALBUTEROL SULFATE HFA 108 (90 BASE) MCG/ACT IN AERS: 2.0000 | INHALATION_SPRAY | RESPIRATORY_TRACT | 0 refills | Status: DC | PRN

## 2024-09-02 MED ORDER — TRAZODONE HCL 100 MG PO TABS: 100.0000 mg | ORAL_TABLET | Freq: Every evening | ORAL | Status: DC | PRN

## 2024-09-02 MED ORDER — LORAZEPAM 2 MG/ML IJ SOLN: 2.0000 mg | Freq: Three times a day (TID) | INTRAMUSCULAR | Status: DC | PRN

## 2024-09-02 MED ORDER — FERROUS SULFATE 325 (65 FE) MG PO TABS
325.0000 mg | ORAL_TABLET | Freq: Two times a day (BID) | ORAL | Status: DC
  Administered 2024-09-03: 325 mg via ORAL
  Filled 2024-09-02: qty 10
  Filled 2024-09-02 (×2): qty 1

## 2024-09-02 MED ORDER — BENZONATATE 100 MG PO CAPS
200.0000 mg | ORAL_CAPSULE | Freq: Three times a day (TID) | ORAL | Status: DC | PRN
  Administered 2024-09-02: 200 mg via ORAL
  Filled 2024-09-02: qty 2
  Filled 2024-09-02: qty 30

## 2024-09-02 MED ORDER — ACETAMINOPHEN 325 MG PO TABS: 650.0000 mg | ORAL_TABLET | Freq: Four times a day (QID) | ORAL | Status: DC | PRN

## 2024-09-02 MED ORDER — BENZONATATE 200 MG PO CAPS: 200.0000 mg | ORAL_CAPSULE | Freq: Three times a day (TID) | ORAL | 0 refills | Status: DC | PRN

## 2024-09-02 MED ORDER — AMOXICILLIN 500 MG PO CAPS
500.0000 mg | ORAL_CAPSULE | Freq: Three times a day (TID) | ORAL | Status: DC
  Administered 2024-09-02 – 2024-09-03 (×3): 500 mg via ORAL
  Filled 2024-09-02 (×3): qty 1
  Filled 2024-09-02: qty 27

## 2024-09-02 MED ORDER — MAGNESIUM HYDROXIDE 400 MG/5ML PO SUSP: 30.0000 mL | Freq: Every day | ORAL | Status: DC | PRN

## 2024-09-02 MED ORDER — AMOXICILLIN 500 MG PO CAPS: 500.0000 mg | ORAL_CAPSULE | Freq: Three times a day (TID) | ORAL | 0 refills | Status: DC

## 2024-09-02 MED ORDER — HALOPERIDOL LACTATE 5 MG/ML IJ SOLN: 10.0000 mg | Freq: Three times a day (TID) | INTRAMUSCULAR | Status: DC | PRN

## 2024-09-02 MED ORDER — VITAMIN D (ERGOCALCIFEROL) 1.25 MG (50000 UNIT) PO CAPS
50000.0000 [IU] | ORAL_CAPSULE | ORAL | Status: DC
  Administered 2024-09-02: 50000 [IU] via ORAL
  Filled 2024-09-02 (×2): qty 1

## 2024-09-02 MED ORDER — ALUM & MAG HYDROXIDE-SIMETH 200-200-20 MG/5ML PO SUSP: 30.0000 mL | ORAL | Status: DC | PRN

## 2024-09-02 MED ORDER — NIFEDIPINE ER OSMOTIC RELEASE 30 MG PO TB24
30.0000 mg | ORAL_TABLET | Freq: Every day | ORAL | Status: DC
  Administered 2024-09-03: 30 mg via ORAL
  Filled 2024-09-02: qty 10
  Filled 2024-09-02 (×2): qty 1

## 2024-09-02 MED ORDER — ARIPIPRAZOLE 15 MG PO TABS
15.0000 mg | ORAL_TABLET | Freq: Every day | ORAL | Status: DC
  Administered 2024-09-03: 15 mg via ORAL
  Filled 2024-09-02 (×2): qty 1
  Filled 2024-09-02: qty 10

## 2024-09-02 MED ORDER — HYDROXYZINE HCL 25 MG PO TABS: 25.0000 mg | ORAL_TABLET | Freq: Three times a day (TID) | ORAL | Status: DC | PRN

## 2024-09-02 MED ORDER — HALOPERIDOL 5 MG PO TABS: 5.0000 mg | ORAL_TABLET | Freq: Three times a day (TID) | ORAL | Status: DC | PRN

## 2024-09-02 NOTE — ED Provider Triage Note (Signed)
 Emergency Medicine Provider Triage Evaluation Note  Melinda Jimenez , a 38 y.o. female  was evaluated in triage.  Pt complains of flulike illness - n/v/d with cough and congestion for one week.  Review of Systems  Positive: Cough, n/v/d Negative: Abdominal pain  Physical Exam  BP (!) 149/105 (BP Location: Right Arm)   Pulse 94   Temp 99.9 F (37.7 C) (Oral)   Resp 18   Ht 5' 7 (1.702 m)   Wt 104.3 kg   LMP  (LMP Unknown)   SpO2 100%   BMI 36.02 kg/m  Gen:   Awake, no distress   Resp:  Normal effort. Lungs CTAB MSK:   Moves extremities without difficulty  Other:    Medical Decision Making  Medically screening exam initiated at 2:22 AM.  Appropriate orders placed.  CHARIAH BAILEY was informed that the remainder of the evaluation will be completed by another provider, this initial triage assessment does not replace that evaluation, and the importance of remaining in the ED until their evaluation is complete.     Haze Lonni PARAS, MD 09/02/24 [phone]

## 2024-09-02 NOTE — ED Provider Notes (Signed)
 [facility] Continuous Assessment Admission H&P  Date: 09/02/24 Patient Name: Melinda Jimenez MRN: [id number] Chief Complaint: Suicidal Ideations  Diagnoses:  Final diagnoses:  None    HPI:  Melinda Jimenez is a 38 year old African-American female who was recently discharged from [facility] on 07/30/2024. Present with past psychiatric history significant for major depressive disorder, recurrent, episode severe generalized anxiety, disorder bipolar 1 disorder severe, borderline personality disorder. PMHx include hypertension, prediabetes, and vitamin D  deficiency.    Patient has been hospitalized several times at the [facility] this summer and fall due to poor control of her bipolar 1 disorder, medication nonadherence.  Patient also may have some component of secondary gain involvement in which she seeks behavioral health treatment when it is convenient for her.  She has a history of medication nonadherence as well as any history of no showing outpatient appointments.  Today the patient reports that she had been doing okay until last night when she received news that her brother, a Melinda Jimenez was shot and killed last night.  When the provider first searched this name and recent crime reports at approximately 2 PM, there was no indication that this information was true or publicly available. However the patient reports that she had heard it on social media and then received a call from her father confirming the truth of the reports.  Later this afternoon when the provider re-searched, news stories surfaced at around 3 PM.  Indicating that the patient knew about this death prior to it being publicly available.  This is a complicated picture due to the patient's history of borderline personality disorder and lack of collateral availability during any of her previous hospital admissions.  Patient stated that she is not currently taking her medications as prescribed but that she brought  them with her.  She first tells the interviewer that admits that she is homeless and has no place to go  Total Time spent with patient: 15 minutes  Musculoskeletal  Strength & Muscle Tone: within normal limits Gait & Station: normal Patient leans: N/A  Psychiatric Specialty Exam  Mental Status Exam:  Appearance: Patient is an obese African-American female, her eyes are puffy from crying, she intermittently has tears running down her face throughout the interview.  She is wearing a bright pink bonnet.  Behavior: Patient laying down on floor of interview room throughout the interview.  Attitude: Distraught  Speech: Normal rate rhythm  Mood:  I am suicidal because my brother died  Affect: Labile, tearful  Thought Process: Somewhat circumstantial, but generally goal-directed  Thought Content: Denies paranoia, delusions  SI/HI: Endorses active suicidal ideation with plan to jump in front of a car, denies HI  Perceptions: Denies, not seen responding  Judgment: Lacking  Insight: Fair   Fund of Knowledge: WNL     Physical Exam Vitals and nursing note reviewed.  Constitutional:      Appearance: She is obese.  HENT:     Head: Normocephalic and atraumatic.  Neurological:     Mental Status: She is alert.  Psychiatric:        Attention and Perception: Attention normal. She does not perceive auditory or visual hallucinations.        Mood and Affect: Mood is depressed. Affect is labile and tearful.        Speech: Speech normal.        Behavior: Behavior is agitated and actively hallucinating.        Thought Content: Thought content  is paranoid. Thought content includes suicidal ideation. Thought content does not include homicidal ideation. Thought content includes suicidal plan. Thought content does not include homicidal plan.        Cognition and Memory: Cognition and memory normal.        Judgment: Judgment is impulsive.    Review of Systems  Constitutional:  Negative for chills,  diaphoresis, fever, malaise/fatigue and weight loss.  Respiratory:  Negative for cough.   Cardiovascular:  Negative for chest pain.  Gastrointestinal:  Negative for abdominal pain, constipation, diarrhea, nausea and vomiting.  Genitourinary:  Negative for urgency.  Psychiatric/Behavioral:  Positive for depression and suicidal ideas. Negative for hallucinations and substance abuse. The patient is not nervous/anxious.     Blood pressure 114/89, pulse 77, temperature 97.8 F (36.6 C), temperature source Oral, resp. rate 20, SpO2 99%. There is no height or weight on file to calculate BMI.  Past Psychiatric History: Previous Psych Diagnoses: MDD, bipolar disorder, and generalized anxiety. Prior inpatient treatment: X 2, most recently at [facility] in June 2025 Current/prior outpatient treatment: Denies Prior rehab hx: Denies Psychotherapy hx: Yes History of suicide: Denies History of homicide or aggression: Denies Psychiatric medication history: Yes, Abilify  trazodone  and hydroxyzine  Psychiatric medication compliance history: Noncompliance Neuromodulation history: denies Current Psychiatrist: Denies Current therapist: Denies  Is the patient at risk to self? Yes  Has the patient been a risk to self in the past 6 months? Yes .    Has the patient been a risk to self within the distant past? Yes   Is the patient a risk to others? No   Has the patient been a risk to others in the past 6 months? No   Has the patient been a risk to others within the distant past? No   Past Medical History:  Past Medical History:  Diagnosis Date   Anemia    Anxiety    off meds with preg   Bipolar 1 disorder (HCC) 03/10/2024   Borderline personality disorder (HCC) 03/10/2024   Brief psychotic disorder (HCC) 04/03/2020   Carpal tunnel syndrome    Cesarean delivery delivered 10/10/2020   Chlamydia    Chronic hypertension    Chronic post-traumatic stress disorder (PTSD) 03/10/2024   COVID-19 09/12/2020    Genital HSV    Last OB: > 54yr ago; Valtrex  500 mg po prn outbreaks   GERD (gastroesophageal reflux disease)    GERD without esophagitis 01/20/2013   Gestational diabetes    diet controlled   Goiter, unspecified 04/18/2010   Centricity Description: THYROMEGALY  Qualifier: Diagnosis of   By: Lelon RIGGERS, Scott      Centricity Description: GOITER  Qualifier: Diagnosis of   By: Lelon RIGGERS, Scott      Replacing diagnoses that were inactivated after the 12/31/22 regulatory import     H/O: cesarean section 09/27/2016   last C-section for active HSV, C-section X1: 05/20/13; records in epic [facility]  TOLAC desired, signed 09/27/16      Headache(784.0)    Herpes genitalis in women 04/18/2010   Qualifier: Diagnosis of   By: Lelon RIGGERS, Scott         IBS (irritable bowel syndrome)    Infection    UTI   Intrauterine pregnancy 03/22/2020   EDC 11/14/2020     Panic attack    Premature rupture of membranes (PROM), onset of labor after 24 hours, antepartum, preterm, third trimester 10/10/2020   Pulmonary embolus (HCC) 10/18/2020   Thyroid  disease    Trichomonas  Vitamin D  deficiency 03/10/2024    Social History:  Patient is homeless, reports that she was recently kicked out of her home. She has no apparent means of financial support.  Patient may benefit from application for disability benefits   Last Labs:  Admission on 09/02/2024, Discharged on 09/02/2024  Component Date Value Ref Range Status   SARS Coronavirus 2 by RT PCR 09/02/2024 NEGATIVE  NEGATIVE Final   Influenza A by PCR 09/02/2024 NEGATIVE  NEGATIVE Final   Influenza B by PCR 09/02/2024 NEGATIVE  NEGATIVE Final   Comment: (NOTE) The Xpert Xpress SARS-CoV-2/FLU/RSV plus assay is intended as an aid in the diagnosis of influenza from Nasopharyngeal swab specimens and should not be used as a sole basis for treatment. Nasal washings and aspirates are unacceptable for Xpert Xpress SARS-CoV-2/FLU/RSV testing.  Fact Sheet for  Patients: bloggercourse.com  Fact Sheet for Healthcare Providers: seriousbroker.it  This test is not yet approved or cleared by the [place]  FDA and has been authorized for detection and/or diagnosis of SARS-CoV-2 by FDA under an Emergency Use Authorization (EUA). This EUA will remain in effect (meaning this test can be used) for the duration of the COVID-19 declaration under Section 564(b)(1) of the Act, 21 U.S.C. section 360bbb-3(b)(1), unless the authorization is terminated or revoked.     Resp Syncytial Virus by PCR 09/02/2024 NEGATIVE  NEGATIVE Final   Comment: (NOTE) Fact Sheet for Patients: bloggercourse.com  Fact Sheet for Healthcare Providers: seriousbroker.it  This test is not yet approved or cleared by the [place]  FDA and has been authorized for detection and/or diagnosis of SARS-CoV-2 by FDA under an Emergency Use Authorization (EUA). This EUA will remain in effect (meaning this test can be used) for the duration of the COVID-19 declaration under Section 564(b)(1) of the Act, 21 U.S.C. section 360bbb-3(b)(1), unless the authorization is terminated or revoked.  Performed at [facility], [address] [place], [place] [address]     Allergies: Latex, Zithromax  [azithromycin ], and Zofran  [ondansetron ]  Medications:  PTA Medications  Medication Sig   hydrOXYzine  (ATARAX ) 25 MG tablet Take 1 tablet (25 mg total) by mouth 3 (three) times daily as needed for anxiety.   ARIPiprazole  (ABILIFY ) 15 MG tablet Take 1 tablet (15 mg total) by mouth daily.   ferrous sulfate  325 (65 FE) MG tablet Take 1 tablet (325 mg total) by mouth 2 (two) times daily with a meal.   ibuprofen  (ADVIL ) 600 MG tablet Take 1 tablet (600 mg total) by mouth every 6 (six) hours as needed for moderate pain (pain score 4-6).   NIFEdipine  (ADALAT  CC) 30 MG 24 hr tablet Take 1 tablet (30  mg total) by mouth daily.   traZODone  (DESYREL ) 100 MG tablet Take 1 tablet (100 mg total) by mouth at bedtime as needed for sleep.   Vitamin D , Ergocalciferol , (DRISDOL ) 1.25 MG (50000 UNIT) CAPS capsule Take 1 capsule (50,000 Units total) by mouth every 7 (seven) days.   albuterol  (VENTOLIN  HFA) 108 (90 Base) MCG/ACT inhaler Inhale 2 puffs into the lungs every 4 (four) hours as needed for wheezing or shortness of breath.      Medical Decision Making  LANASIA PORRAS is a 38 year old African-American female who was recently discharged from [facility] on 07/30/2024. Present with past psychiatric history significant for major depressive disorder, recurrent, episode severe generalized anxiety, disorder bipolar 1 disorder severe, borderline personality disorder. PMHx include hypertension, prediabetes, and vitamin D  deficiency.    Patient reports that she is suicidal  in the setting of her brother's murder last night. Per news searches, there may be some truth to what she says. Initial searches did not pull up the name that the patient offered the notewriter, but an hour later, the name appeared exactly as she described. She knew when, where, and whom had been killed before the information was publicly available. She reported she had gotten a message via facebook late last night and a call from her father.   Unable to verify the authenticity of her other claims (none of the phone numbers in her chart work), but the patient would benefit from an overnight stay in a supervised environment due to her suicidal ideation.   Plan to discharge from [facility] tomorrow. Unlikely to warrant a [facility] hospitalization.    Recommendations  Based on my evaluation the patient does not appear to have an emergency medical condition.  Melinda Morene Lavone Delsie, MD 09/02/24  3:06 PM

## 2024-09-02 NOTE — ED Notes (Signed)
 Patient lying in bed quietly with complaint of nausea, this nursed asked patient if she would like medication possibly zofran  patient declined

## 2024-09-02 NOTE — ED Provider Notes (Signed)
 [place] EMERGENCY DEPARTMENT AT [facility] Provider Note   CSN: [id number] Arrival date & time: 09/02/24  0209     Patient presents with: flu sx   Melinda Jimenez is a 38 y.o. female.   Presents to the emergency department for evaluation of flulike illness.  Patient reports that she has been sick for nearly a week.         Prior to Admission medications   Medication Sig Start Date End Date Taking? Authorizing Provider  albuterol  (VENTOLIN  HFA) 108 (90 Base) MCG/ACT inhaler Inhale 2 puffs into the lungs every 4 (four) hours as needed for wheezing or shortness of breath. 09/02/24  Yes Elchonon Maxson, Lonni PARAS, MD  amoxicillin  (AMOXIL ) 500 MG capsule Take 1 capsule (500 mg total) by mouth 3 (three) times daily. 09/02/24  Yes Endia Moncur, Lonni PARAS, MD  benzonatate  (TESSALON ) 200 MG capsule Take 1 capsule (200 mg total) by mouth 3 (three) times daily as needed for cough. 09/02/24  Yes Rhyse Loux, Lonni PARAS, MD  ARIPiprazole  (ABILIFY ) 15 MG tablet Take 1 tablet (15 mg total) by mouth daily. 08/21/24   Ntuen, Tina C, FNP  ferrous sulfate  325 (65 FE) MG tablet Take 1 tablet (325 mg total) by mouth 2 (two) times daily with a meal. 08/20/24   Ntuen, Ellouise BROCKS, FNP  hydrOXYzine  (ATARAX ) 25 MG tablet Take 1 tablet (25 mg total) by mouth 3 (three) times daily as needed for anxiety. Patient not taking: Reported on 08/13/2024 07/31/24   Ntuen, Tina C, FNP  ibuprofen  (ADVIL ) 600 MG tablet Take 1 tablet (600 mg total) by mouth every 6 (six) hours as needed for moderate pain (pain score 4-6). 08/20/24   Ntuen, Tina C, FNP  NIFEdipine  (ADALAT  CC) 30 MG 24 hr tablet Take 1 tablet (30 mg total) by mouth daily. 08/21/24   Ntuen, Tina C, FNP  traZODone  (DESYREL ) 100 MG tablet Take 1 tablet (100 mg total) by mouth at bedtime as needed for sleep. 08/20/24   Ntuen, Tina C, FNP  Vitamin D , Ergocalciferol , (DRISDOL ) 1.25 MG (50000 UNIT) CAPS capsule Take 1 capsule (50,000 Units total) by mouth every 7  (seven) days. 08/23/24   Ntuen, Tina C, FNP  fluticasone  (FLONASE ) 50 MCG/ACT nasal spray Place 1 spray into both nostrils daily. Use for 2-3 weeks to help with the nasal congestion and ear pain Patient not taking: Reported on 01/21/2020 01/20/20 02/11/20  Doretha Folks, MD  sertraline  (ZOLOFT ) 25 MG tablet Take one tablet daily. If tolerating, increase to two tablets in one week. Patient not taking: Reported on 01/21/2020 01/18/20 02/11/20  Prentiss Dorothyann Maxwell, MD  zolpidem  (AMBIEN ) 5 MG tablet Take 1 tablet (5 mg total) by mouth at bedtime as needed for sleep. Patient not taking: Reported on 01/21/2020 01/20/20 02/11/20  Doretha Folks, MD    Allergies: Latex, Zithromax  [azithromycin ], and Zofran  [ondansetron ]    Review of Systems  Updated Vital Signs BP 111/72   Pulse 73   Temp 97.6 F (36.4 C) (Oral)   Resp 20   Ht 5' 7 (1.702 m)   Wt 104.3 kg   LMP  (LMP Unknown)   SpO2 97%   BMI 36.02 kg/m   Physical Exam Vitals and nursing note reviewed.  Constitutional:      General: She is not in acute distress.    Appearance: She is well-developed.  HENT:     Head: Normocephalic and atraumatic.     Mouth/Throat:     Mouth: Mucous membranes are moist.  Eyes:     General: Vision grossly intact. Gaze aligned appropriately.     Extraocular Movements: Extraocular movements intact.     Conjunctiva/sclera: Conjunctivae normal.  Cardiovascular:     Rate and Rhythm: Normal rate and regular rhythm.     Pulses: Normal pulses.     Heart sounds: Normal heart sounds, S1 normal and S2 normal. No murmur heard.    No friction rub. No gallop.  Pulmonary:     Effort: Pulmonary effort is normal. No respiratory distress.     Breath sounds: Normal breath sounds.  Abdominal:     General: Bowel sounds are normal.     Palpations: Abdomen is soft.     Tenderness: There is no abdominal tenderness. There is no guarding or rebound.     Hernia: No hernia is present.  Musculoskeletal:         General: No swelling.     Cervical back: Full passive range of motion without pain, normal range of motion and neck supple. No spinous process tenderness or muscular tenderness. Normal range of motion.     Right lower leg: No edema.     Left lower leg: No edema.  Skin:    General: Skin is warm and dry.     Capillary Refill: Capillary refill takes less than 2 seconds.     Findings: No ecchymosis, erythema, rash or wound.  Neurological:     General: No focal deficit present.     Mental Status: She is alert and oriented to person, place, and time.     GCS: GCS eye subscore is 4. GCS verbal subscore is 5. GCS motor subscore is 6.     Cranial Nerves: Cranial nerves 2-12 are intact.     Sensory: Sensation is intact.     Motor: Motor function is intact.     Coordination: Coordination is intact.  Psychiatric:        Attention and Perception: Attention normal.        Mood and Affect: Mood normal.        Speech: Speech normal.        Behavior: Behavior normal.     (all labs ordered are listed, but only abnormal results are displayed) Labs Reviewed  RESP PANEL BY RT-PCR (RSV, FLU A&B, COVID)  [id number]    EKG: None  Radiology: No results found.   Procedures   Medications Ordered in the ED - No data to display                                  Medical Decision Making  Differential Diagnosis considered includes, but not limited to: COVID-19; influenza; RSV; simple viral URI; strep pharyngitis; pneumonia  Presents to the emergency department for evaluation of flulike illness.  Patient reports that she has been sick for nearly a week.  Patient mainly here with headache, nasal congestion, sore throat and cough.  She does also report some GI symptoms but has not had any vomiting or diarrhea here in the ED.  Patient appears well.  Vital signs are unremarkable.  She is breathing comfortably.  Lungs are clear, no wheezing or clinical signs of pneumonia.  COVID, influenza, RSV negative.      Final diagnoses:  Bronchitis    ED Discharge Orders          Ordered    benzonatate  (TESSALON ) 200 MG capsule  3 times daily PRN  09/02/24 0559    albuterol  (VENTOLIN  HFA) 108 (90 Base) MCG/ACT inhaler  Every 4 hours PRN        09/02/24 0559    amoxicillin  (AMOXIL ) 500 MG capsule  3 times daily        09/02/24 0559               Haze Lonni PARAS, MD 09/02/24 [phone]

## 2024-09-02 NOTE — ED Notes (Signed)
 Patient has been brought on unit and familiarized with unit, unsuccessful attempt at blood draw, and patient refused to allow staff another attempt at this time.

## 2024-09-02 NOTE — ED Triage Notes (Signed)
 Pt to ED via [facility] from home c/o flu sx x 4 days. Reports HA, cough, body aches, sore throat. Has not taken OTC medications.   No medications given by EMS  140/70, hr 96, 98%RA, CBG 141 , 97.4
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
## 2024-09-02 NOTE — ED Notes (Signed)
 Pt A&O x 4, sleeping at present. No distress noted.calm & cooperative.  Monitoring for safety.
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
## 2024-09-03 MED ORDER — ALBUTEROL SULFATE HFA 108 (90 BASE) MCG/ACT IN AERS: 2.0000 | INHALATION_SPRAY | RESPIRATORY_TRACT | Status: AC | PRN

## 2024-09-03 MED ORDER — BENZONATATE 200 MG PO CAPS: 200.0000 mg | ORAL_CAPSULE | Freq: Three times a day (TID) | ORAL | Status: DC | PRN

## 2024-09-03 MED ORDER — VITAMIN D (ERGOCALCIFEROL) 1.25 MG (50000 UNIT) PO CAPS: 50000.0000 [IU] | ORAL_CAPSULE | ORAL | Status: DC

## 2024-09-03 MED ORDER — TRAZODONE HCL 100 MG PO TABS: 100.0000 mg | ORAL_TABLET | Freq: Every evening | ORAL | Status: DC | PRN

## 2024-09-03 MED ORDER — NIFEDIPINE ER 30 MG PO TB24: 30.0000 mg | ORAL_TABLET | Freq: Every day | ORAL | 0 refills | Status: DC

## 2024-09-03 MED ORDER — HYDROXYZINE HCL 25 MG PO TABS: 25.0000 mg | ORAL_TABLET | Freq: Three times a day (TID) | ORAL | Status: DC | PRN

## 2024-09-03 MED ORDER — ARIPIPRAZOLE 15 MG PO TABS: 15.0000 mg | ORAL_TABLET | Freq: Every day | ORAL | Status: DC

## 2024-09-03 MED ORDER — FERROUS SULFATE 325 (65 FE) MG PO TABS: 325.0000 mg | ORAL_TABLET | Freq: Two times a day (BID) | ORAL | Status: DC

## 2024-09-03 MED ORDER — AMOXICILLIN 500 MG PO CAPS: 500.0000 mg | ORAL_CAPSULE | Freq: Three times a day (TID) | ORAL | Status: DC

## 2024-09-03 NOTE — ED Notes (Signed)
Pt awake & resting at present, no distress noted.  Monitoring for safety. 

## 2024-09-03 NOTE — Group Note (Unsigned)
 Group Topic: Wellness  Group Date: 09/03/2024 Start Time: 0800 End Time: 0830 Facilitators: Byrd, Henriette Hesser, RN  Department: [facility]  Number of Participants: 4  Group Focus: check in, communication, nursing group, and psychiatric education Treatment Modality:  Psychoeducation Interventions utilized were clarification, patient education, and support Purpose: express feelings, improve communication skills, and increase insight   Name: Melinda Jimenez Date of Birth: 10-23-1985  MR: [id number]    Level of Participation: {THERAPIES; PSYCH GROUP PARTICIPATION OZCZO:76008} Quality of Participation: {THERAPIES; PSYCH QUALITY OF PARTICIPATION:23992} Interactions with others: {THERAPIES; PSYCH INTERACTIONS:23993} Mood/Affect: {THERAPIES; PSYCH MOOD/AFFECT:23994} Triggers (if applicable): *** Cognition: {THERAPIES; PSYCH COGNITION:23995} Progress: {THERAPIES; PSYCH PROGRESS:23997} Response: *** Plan: {THERAPIES; PSYCH EOJW:76003}  Patients Problems:  Patient Active Problem List   Diagnosis Date Noted   Severe episode of recurrent major depressive disorder, with psychotic features (HCC) 08/13/2024   Hypertension 03/11/2024   PTSD (post-traumatic stress disorder) 03/10/2024   Borderline personality disorder (HCC) 03/10/2024   Prediabetes 03/10/2024   Bipolar affective disorder, depressed, severe (HCC) 03/09/2024   Vitamin D  deficiency 04/19/2010   GAD (generalized anxiety disorder) 04/18/2010

## 2024-09-03 NOTE — Group Note (Unsigned)
 Group Topic: Fears and Unhealthy Coping Skills  Group Date: 09/03/2024 Start Time: 1000 End Time: 1045 Facilitators: Lonzell Dwayne RAMAN, NT  Department: [facility]  Number of Participants: 2  Group Focus: depression and substance abuse education Treatment Modality:  Behavior Modification Therapy and Spiritual Interventions utilized were mental fitness and patient education Purpose: explore maladaptive thinking, regain self-worth, and relapse prevention strategies   Name: Melinda Jimenez Date of Birth: 1985/10/31  MR: [id number]    Level of Participation: {THERAPIES; PSYCH GROUP PARTICIPATION OZCZO:76008} Quality of Participation: {THERAPIES; PSYCH QUALITY OF PARTICIPATION:23992} Interactions with others: {THERAPIES; PSYCH INTERACTIONS:23993} Mood/Affect: {THERAPIES; PSYCH MOOD/AFFECT:23994} Triggers (if applicable): *** Cognition: {THERAPIES; PSYCH COGNITION:23995} Progress: {THERAPIES; PSYCH PROGRESS:23997} Response: *** Plan: {THERAPIES; PSYCH EOJW:76003}  Patients Problems:  Patient Active Problem List   Diagnosis Date Noted   Severe episode of recurrent major depressive disorder, with psychotic features (HCC) 08/13/2024   Hypertension 03/11/2024   PTSD (post-traumatic stress disorder) 03/10/2024   Borderline personality disorder (HCC) 03/10/2024   Prediabetes 03/10/2024   Bipolar affective disorder, depressed, severe (HCC) 03/09/2024   Vitamin D  deficiency 04/19/2010   GAD (generalized anxiety disorder) 04/18/2010

## 2024-09-03 NOTE — ED Notes (Signed)
 Pt sleeping at present, no distress noted.  Monitoring for safety.

## 2024-09-03 NOTE — Discharge Instructions (Signed)
 Outpatient Therapy and Psychiatry Resources for Patients: Your psychiatric needs would be well-served by consultation and regular meetings with an outpatient therapist to assist you with your mood-related conditions. Here are a series of links for finding a therapist.    Includes links to the following: [facility] (http://wilson-mayo.com/) (only for [facility] and please reserve for uninsured) [facility] (http://blankenship-martinez.net/) Psychology Today Special Educational Needs Teacher (https://www.psychologytoday.com/us lendell) Psychology Today Support Group Tax Inspector (https://www.psychologytoday.com/us /groups/) [employer] - [employer] Location (https://carolinabehavioralcare.com/staff-location/[place]/) [facility] - Support Group Finder - (recorddebt.fi) [facility] [employer] - Lexicographer (https://fspcares.org/contact/) [employer] for Mental Health [place] - NAMI (https://namiguilford.org/support-and-education/support-groups/) [employer]er [facility] - Affiliated with [facility] (https://www.K[place]terra.com/lb/locations/profile/[facility]-at-[address]/) Dept of Health and Health And Safety Inspector - Find a mental health facility (http://lester.info/) _ Mental Health Crisis Resources:

## 2024-09-03 NOTE — Care Management (Signed)
 OBS Care Management   Writer met with the patient to discuss discharge planning.  Writer provided patient with options to go to the [facility] and the [facility]).    Patient reports that she is able to discharge and she will be able to use her phone in the lobby to contact family members or the [facility].   Writer consulted with the MD and informed the RN working with the patient .

## 2024-09-03 NOTE — ED Notes (Signed)
 Pt is resting with eyes closed, respiration with even rise and fall of chest, no acute distress noted.

## 2024-09-03 NOTE — ED Notes (Signed)
 Pt discharged, belongings returned to patients satisfaction, no acute distress noted, pt denies needs at [facility]. [facility] Discharge instructions reviewed with patient, pt verbalized understanding.

## 2024-09-03 NOTE — ED Provider Notes (Signed)
 FBC/OBS ASAP Discharge Summary  Date and Time: 09/03/2024 1:09 PM  Name: Melinda Jimenez  MRN:  [id number]   Discharge Diagnoses:  Final diagnoses:  None    Subjective: Pt was seen with medical student on the adult observation unit.  Discussed various aspects of the patient's grief process.  Discussed that she was admitted yesterday in the setting of acute shock.  Discussed that today it would be appropriate for her to pursue other options for housing and pursue her psychiatric care on an outpatient basis.  She agreed with this approach and was amenable to discharge.  Reports: Sleep: Fair Appetite: Fair Depression: About the same as yesterday Anxiety: About the same as yesterday Auditory Hallucinations: Denies Visual Hallucinations: Denies Paranoia: Denies Delusions: Denies SI: Passive SI HI: Denies   Stay Summary: Admitted on 12/3 for acute on chronic SI  Total Time spent with patient: 20 minutes  Past Psychiatric History: Previous Psych Diagnoses: MDD, bipolar disorder, and generalized anxiety. Prior inpatient treatment: X 2, most recently at [facility] in June 2025 Current/prior outpatient treatment: Denies Prior rehab hx: Denies Psychotherapy hx: Yes History of suicide: Denies History of homicide or aggression: Denies Psychiatric medication history: Yes, Abilify  trazodone  and hydroxyzine  Psychiatric medication compliance history: Noncompliance Neuromodulation history: denies Current Psychiatrist: Denies Current therapist: Denies  Past Medical History:  Past Medical History:  Diagnosis Date   Anemia    Anxiety    off meds with preg   Bipolar 1 disorder (HCC) 03/10/2024   Borderline personality disorder (HCC) 03/10/2024   Brief psychotic disorder (HCC) 04/03/2020   Carpal tunnel syndrome    Cesarean delivery delivered 10/10/2020   Chlamydia    Chronic hypertension    Chronic post-traumatic stress disorder (PTSD) 03/10/2024   COVID-19 09/12/2020   Genital HSV    Last  OB: > 34yr ago; Valtrex  500 mg po prn outbreaks   GERD (gastroesophageal reflux disease)    GERD without esophagitis 01/20/2013   Gestational diabetes    diet controlled   Goiter, unspecified 04/18/2010   Centricity Description: THYROMEGALY  Qualifier: Diagnosis of   By: Lelon RIGGERS, Scott      Centricity Description: GOITER  Qualifier: Diagnosis of   By: Lelon RIGGERS, Scott      Replacing diagnoses that were inactivated after the 12/31/22 regulatory import     H/O: cesarean section 09/27/2016   last C-section for active HSV, C-section X1: 05/20/13; records in epic [facility]  TOLAC desired, signed 09/27/16      Headache(784.0)    Herpes genitalis in women 04/18/2010   Qualifier: Diagnosis of   By: Lelon RIGGERS, Scott         IBS (irritable bowel syndrome)    Infection    UTI   Intrauterine pregnancy 03/22/2020   EDC 11/14/2020     Panic attack    Premature rupture of membranes (PROM), onset of labor after 24 hours, antepartum, preterm, third trimester 10/10/2020   Pulmonary embolus (HCC) 10/18/2020   Thyroid  disease    Trichomonas    Vitamin D  deficiency 03/10/2024    Family History:  Family History  Problem Relation Age of Onset   Depression Mother    Anxiety disorder Mother    Breast cancer Other    Bipolar disorder Brother    Diabetes Maternal Grandmother    Cancer Maternal Grandmother    Hypertension Maternal Grandmother    Hypertension Paternal Grandmother    Autism Other    Schizophrenia Other    Social History:  Patient  is currently homeless, has had numerous family upheavals over the past couple of years that she becomes tearful when she discusses. Tobacco Cessation:  A prescription for an FDA-approved tobacco cessation medication was offered at discharge and the patient refused  Current Medications:  Current Facility-Administered Medications  Medication Dose Route Frequency Provider Last Rate Last Admin   acetaminophen  (TYLENOL ) tablet 650 mg  650 mg Oral Q6H PRN Delsie Lynwood Morene Lavone, MD       albuterol  (VENTOLIN  HFA) 108 (90 Base) MCG/ACT inhaler 2 puff  2 puff Inhalation Q4H PRN Chaeli Judy Benjamin Stallworth, MD       alum & mag hydroxide-simeth (MAALOX/MYLANTA) 200-200-20 MG/5ML suspension 30 mL  30 mL Oral Q4H PRN Delsie Lynwood Morene Lavone, MD       amoxicillin  (AMOXIL ) capsule 500 mg  500 mg Oral TID Delsie Lynwood Morene Lavone, MD   500 mg at 09/03/24 9072   ARIPiprazole  (ABILIFY ) tablet 15 mg  15 mg Oral Daily Delsie Lynwood Morene Lavone, MD   15 mg at 09/03/24 9072   benzonatate  (TESSALON ) capsule 200 mg  200 mg Oral TID PRN Delsie Lynwood Morene Lavone, MD   200 mg at 09/02/24 2146   haloperidol  (HALDOL ) tablet 5 mg  5 mg Oral TID PRN Delsie Lynwood Morene Lavone, MD       And   diphenhydrAMINE  (BENADRYL ) capsule 50 mg  50 mg Oral TID PRN Delsie Lynwood Morene Lavone, MD       haloperidol  lactate (HALDOL ) injection 5 mg  5 mg Intramuscular TID PRN Delsie Lynwood Morene Lavone, MD       And   diphenhydrAMINE  (BENADRYL ) injection 50 mg  50 mg Intramuscular TID PRN Delsie Lynwood Morene Lavone, MD       And   LORazepam  (ATIVAN ) injection 2 mg  2 mg Intramuscular TID PRN Delsie Lynwood Morene Lavone, MD       haloperidol  lactate (HALDOL ) injection 10 mg  10 mg Intramuscular TID PRN Delsie Lynwood Morene Lavone, MD       And   diphenhydrAMINE  (BENADRYL ) injection 50 mg  50 mg Intramuscular TID PRN Delsie Lynwood Morene Lavone, MD       And   LORazepam  (ATIVAN ) injection 2 mg  2 mg Intramuscular TID PRN Delsie Lynwood Morene Lavone, MD       ferrous sulfate  tablet 325 mg  325 mg Oral BID WC Delsie Lynwood Morene Lavone, MD   325 mg at 09/03/24 9072   hydrOXYzine  (ATARAX ) tablet 25 mg  25 mg Oral TID PRN Delsie Lynwood Morene Lavone, MD       magnesium  hydroxide (MILK OF MAGNESIA) suspension 30 mL  30 mL Oral Daily PRN Delsie Lynwood Morene Lavone, MD       NIFEdipine  (PROCARDIA -XL/NIFEDICAL-XL)  24 hr tablet 30 mg  30 mg Oral Daily Delsie Lynwood Morene Lavone, MD   30 mg at 09/03/24 9072   traZODone  (DESYREL ) tablet 100 mg  100 mg Oral QHS PRN Delsie Lynwood Morene Lavone, MD       Vitamin D  (Ergocalciferol ) (DRISDOL ) 1.25 MG (50000 UNIT) capsule 50,000 Units  50,000 Units Oral Q7 days Delsie Lynwood Morene Lavone, MD   50,000 Units at 09/02/24 1637   Current Outpatient Medications  Medication Sig Dispense Refill   ibuprofen  (ADVIL ) 600 MG tablet Take 1 tablet (600 mg total) by mouth every 6 (six) hours as needed for moderate pain (pain score 4-6). 30 tablet 0   albuterol  (VENTOLIN  HFA) 108 (90 Base)  MCG/ACT inhaler Inhale 2 puffs into the lungs every 4 (four) hours as needed for wheezing or shortness of breath.     amoxicillin  (AMOXIL ) 500 MG capsule Take 1 capsule (500 mg total) by mouth 3 (three) times daily.     ARIPiprazole  (ABILIFY ) 15 MG tablet Take 1 tablet (15 mg total) by mouth daily.     benzonatate  (TESSALON ) 200 MG capsule Take 1 capsule (200 mg total) by mouth 3 (three) times daily as needed for cough. 30 capsule    ferrous sulfate  325 (65 FE) MG tablet Take 1 tablet (325 mg total) by mouth 2 (two) times daily with a meal.     hydrOXYzine  (ATARAX ) 25 MG tablet Take 1 tablet (25 mg total) by mouth every 8 (eight) hours as needed for anxiety.     NIFEdipine  (ADALAT  CC) 30 MG 24 hr tablet Take 1 tablet (30 mg total) by mouth daily. 7 tablet 0   traZODone  (DESYREL ) 100 MG tablet Take 1 tablet (100 mg total) by mouth at bedtime as needed for sleep.     Vitamin D , Ergocalciferol , (DRISDOL ) 1.25 MG (50000 UNIT) CAPS capsule Take 1 capsule (50,000 Units total) by mouth every 7 (seven) days.      PTA Medications:  Facility Ordered Medications  Medication   acetaminophen  (TYLENOL ) tablet 650 mg   alum & mag hydroxide-simeth (MAALOX/MYLANTA) 200-200-20 MG/5ML suspension 30 mL   magnesium  hydroxide (MILK OF MAGNESIA) suspension 30 mL   albuterol  (VENTOLIN  HFA) 108 (90  Base) MCG/ACT inhaler 2 puff   ARIPiprazole  (ABILIFY ) tablet 15 mg   benzonatate  (TESSALON ) capsule 200 mg   amoxicillin  (AMOXIL ) capsule 500 mg   NIFEdipine  (PROCARDIA -XL/NIFEDICAL-XL) 24 hr tablet 30 mg   traZODone  (DESYREL ) tablet 100 mg   Vitamin D  (Ergocalciferol ) (DRISDOL ) 1.25 MG (50000 UNIT) capsule 50,000 Units   ferrous sulfate  tablet 325 mg   haloperidol  (HALDOL ) tablet 5 mg   And   diphenhydrAMINE  (BENADRYL ) capsule 50 mg   haloperidol  lactate (HALDOL ) injection 5 mg   And   diphenhydrAMINE  (BENADRYL ) injection 50 mg   And   LORazepam  (ATIVAN ) injection 2 mg   haloperidol  lactate (HALDOL ) injection 10 mg   And   diphenhydrAMINE  (BENADRYL ) injection 50 mg   And   LORazepam  (ATIVAN ) injection 2 mg   hydrOXYzine  (ATARAX ) tablet 25 mg   PTA Medications  Medication Sig   ibuprofen  (ADVIL ) 600 MG tablet Take 1 tablet (600 mg total) by mouth every 6 (six) hours as needed for moderate pain (pain score 4-6).   ARIPiprazole  (ABILIFY ) 15 MG tablet Take 1 tablet (15 mg total) by mouth daily.   amoxicillin  (AMOXIL ) 500 MG capsule Take 1 capsule (500 mg total) by mouth 3 (three) times daily.   hydrOXYzine  (ATARAX ) 25 MG tablet Take 1 tablet (25 mg total) by mouth every 8 (eight) hours as needed for anxiety.   traZODone  (DESYREL ) 100 MG tablet Take 1 tablet (100 mg total) by mouth at bedtime as needed for sleep.   benzonatate  (TESSALON ) 200 MG capsule Take 1 capsule (200 mg total) by mouth 3 (three) times daily as needed for cough.   albuterol  (VENTOLIN  HFA) 108 (90 Base) MCG/ACT inhaler Inhale 2 puffs into the lungs every 4 (four) hours as needed for wheezing or shortness of breath.   Vitamin D , Ergocalciferol , (DRISDOL ) 1.25 MG (50000 UNIT) CAPS capsule Take 1 capsule (50,000 Units total) by mouth every 7 (seven) days.   ferrous sulfate  325 (65 FE) MG tablet Take 1 tablet (325  mg total) by mouth 2 (two) times daily with a meal.   NIFEdipine  (ADALAT  CC) 30 MG 24 hr tablet Take 1  tablet (30 mg total) by mouth daily.       02/17/2020   10:08 AM 05/14/2017    1:05 PM  Depression screen PHQ 2/9  Decreased Interest 3 0  Down, Depressed, Hopeless 3 0  PHQ - 2 Score 6 0  Altered sleeping 3 0  Tired, decreased energy 3 3  Change in appetite 1 0  Feeling bad or failure about yourself  3 0  Trouble concentrating 3 0  Moving slowly or fidgety/restless 2 0  Suicidal thoughts 2 0  PHQ-9 Score 23  3      Data saved with a previous flowsheet row definition    Flowsheet Row ED from 09/02/2024 in [facility] (Discharged[facility]12/[facility] in [facility]  C-SSRS RISK CATEGORY High Risk High Risk High Risk    Musculoskeletal  Strength & Muscle Tone: within normal limits Gait & Station: normal Patient leans: N/A  Psychiatric Specialty Exam  Presentation  General Appearance:  Appropriate for Environment; Casual  Eye Contact: Good  Speech: Clear and Coherent  Speech Volume: Normal  Handedness: Right   Mood and Affect  Mood: Anxious; Depressed  Affect: Congruent   Thought Process  Thought Processes: Coherent; Linear  Descriptions of Associations:Intact  Orientation:Full (Time, Place and Person)  Thought Content:Logical  Diagnosis of Schizophrenia or Schizoaffective disorder in past: No  Duration of Psychotic Symptoms: Less than six months   Hallucinations:No data recorded Ideas of Reference:None  Suicidal Thoughts:No data recorded Homicidal Thoughts:No data recorded  Sensorium  Memory: Immediate Fair; Recent Fair  Judgment: Fair  Insight: Fair   Art Therapist  Concentration: Good  Attention Span: Good  Recall: Fair  Fund of Knowledge: Fair  Language: Good   Psychomotor Activity  Psychomotor Activity:No data recorded  Assets  Assets: Communication Skills; Housing; Physical  Health; Resilience   Sleep  Sleep:No data recorded No Safety Checks orders active in given range  No data recorded  Physical Exam  Physical Exam Vitals and nursing note reviewed.  Constitutional:      General: She is not in acute distress.    Appearance: She is well-developed.  HENT:     Head: Normocephalic and atraumatic.  Eyes:     Conjunctiva/sclera: Conjunctivae normal.  Cardiovascular:     Rate and Rhythm: Normal rate and regular rhythm.     Heart sounds: No murmur heard. Pulmonary:     Effort: Pulmonary effort is normal. No respiratory distress.     Breath sounds: Rhonchi present.  Abdominal:     Palpations: Abdomen is soft.     Tenderness: There is no abdominal tenderness.  Musculoskeletal:        General: No swelling.     Cervical back: Neck supple.  Skin:    General: Skin is warm and dry.     Capillary Refill: Capillary refill takes less than 2 seconds.  Neurological:     Mental Status: She is alert.  Psychiatric:        Mood and Affect: Mood normal.        Behavior: Behavior normal.        Judgment: Judgment normal.    Review of Systems  Constitutional:  Negative for chills, diaphoresis, fever, malaise/fatigue and weight loss.  Respiratory:  Positive for cough  and sputum production. Negative for wheezing.   Cardiovascular:  Negative for chest pain.  Gastrointestinal:  Negative for abdominal pain, constipation, diarrhea, nausea and vomiting.  Genitourinary:  Negative for urgency.  Psychiatric/Behavioral:  Positive for depression. Negative for hallucinations and substance abuse. The patient is nervous/anxious.    Blood pressure 126/88, pulse 86, temperature 97.6 F (36.4 C), temperature source Oral, resp. rate 16, SpO2 100%. There is no height or weight on file to calculate BMI.  Suicide Risk Assessment:  Suicidal ideation/thoughts:   []  Current         [x]  Recent         []  Denies        []  Remote   [x]  Chronic  Intention to act or plan:        []   Current     []  Recent [x]  Denies  []  Remote  Preparatory behavior:  []  Recent    [x]  Denies Recent      []  Remote Instance  Suicide attempts:  []  Immediately prior to this admission     []  During this admission    []  Recent    [x]  Multiple   [x]  Remote    []  Denies Ever     Risk Factors  Protective Factors  Acute  Recent loss (job, relationship, family member), Withdrawing from society, Recent impulsivity or acting recklessly, and Feelings of humiliation, shame, or guilt AcuteSuicideProtectiveFactors: No access to highly lethal means, Denies current SI or Intent, No recent suicide attempts, No pattern of escalating substance use, No signs of apparent anger/rage, Anxiety/panic systems are not acute, and No command AH encouraging suicide  Chronic Major psychiatric disorder, Cluster B personality disorder/traits, Emotional lability, Poor coping or problem solving skills, Pattern of impulsivity/recklessness, Single, Separated, or Divorced, Chronic unemployment, Chronic homelessness, Lack of social support, and Barriers to accessing healthcare No history of violence, Good physical health, No barriers to healthcare access, and Willingness to seek help   Potential future factors that may impact risk: FutureSuicideFactors : Chronic/terminal illness of loved one and Expected change in living situation  Summary: While it is impossible to accurately predict with absolute certainty future events and human behaviors, an assessment of current suicidal indicators, risk factors, and protective factors suggests that this patient's:   Acute suicide risk is: mild in degree.    Chronic suicide risk is: moderate in degree.   Suicide risk increases with substance/alcohol use and acute intoxication.  Disposition: Discharged to self-care  Lynwood Morene Lavone Delsie, MD 09/03/2024, 1:09 PM

## 2024-09-07 ENCOUNTER — Ambulatory Visit (HOSPITAL_COMMUNITY)
Admission: EM | Admit: 2024-09-07 | Discharge: 2024-09-09 | Disposition: A | Payer: MEDICAID | Attending: Psychiatry | Admitting: Psychiatry

## 2024-09-07 DIAGNOSIS — Z79899 Other long term (current) drug therapy: Secondary | ICD-10-CM | POA: Insufficient documentation

## 2024-09-07 DIAGNOSIS — R410 Disorientation, unspecified: Secondary | ICD-10-CM | POA: Diagnosis not present

## 2024-09-07 DIAGNOSIS — F339 Major depressive disorder, recurrent, unspecified: Secondary | ICD-10-CM

## 2024-09-07 DIAGNOSIS — I10 Essential (primary) hypertension: Secondary | ICD-10-CM | POA: Diagnosis not present

## 2024-09-07 DIAGNOSIS — F411 Generalized anxiety disorder: Secondary | ICD-10-CM | POA: Diagnosis not present

## 2024-09-07 DIAGNOSIS — F603 Borderline personality disorder: Secondary | ICD-10-CM | POA: Diagnosis not present

## 2024-09-07 DIAGNOSIS — Z818 Family history of other mental and behavioral disorders: Secondary | ICD-10-CM | POA: Diagnosis not present

## 2024-09-07 DIAGNOSIS — Z638 Other specified problems related to primary support group: Secondary | ICD-10-CM | POA: Insufficient documentation

## 2024-09-07 DIAGNOSIS — Z5900 Homelessness unspecified: Secondary | ICD-10-CM

## 2024-09-07 DIAGNOSIS — F4310 Post-traumatic stress disorder, unspecified: Secondary | ICD-10-CM | POA: Insufficient documentation

## 2024-09-07 DIAGNOSIS — F319 Bipolar disorder, unspecified: Secondary | ICD-10-CM | POA: Insufficient documentation

## 2024-09-07 LAB — POCT URINE DRUG SCREEN - MANUAL ENTRY (I-SCREEN)
POC Amphetamine UR: NOT DETECTED
POC Buprenorphine (BUP): NOT DETECTED
POC Cocaine UR: NOT DETECTED
POC Marijuana UR: NOT DETECTED
POC Methadone UR: NOT DETECTED
POC Methamphetamine UR: NOT DETECTED
POC Morphine: NOT DETECTED
POC Oxazepam (BZO): NOT DETECTED
POC Oxycodone UR: NOT DETECTED
POC Secobarbital (BAR): NOT DETECTED

## 2024-09-07 LAB — POC URINE PREG, ED: Preg Test, Ur: NEGATIVE

## 2024-09-07 MED ORDER — DIPHENHYDRAMINE HCL 50 MG/ML IJ SOLN: 50.0000 mg | Freq: Three times a day (TID) | INTRAMUSCULAR | Status: DC | PRN

## 2024-09-07 MED ORDER — LORAZEPAM 2 MG/ML IJ SOLN: 2.0000 mg | Freq: Three times a day (TID) | INTRAMUSCULAR | Status: DC | PRN

## 2024-09-07 MED ORDER — HALOPERIDOL 5 MG PO TABS: 5.0000 mg | ORAL_TABLET | Freq: Three times a day (TID) | ORAL | Status: DC | PRN

## 2024-09-07 MED ORDER — ACETAMINOPHEN 325 MG PO TABS: 650.0000 mg | ORAL_TABLET | Freq: Four times a day (QID) | ORAL | Status: DC | PRN

## 2024-09-07 MED ORDER — HALOPERIDOL LACTATE 5 MG/ML IJ SOLN: 10.0000 mg | Freq: Three times a day (TID) | INTRAMUSCULAR | Status: DC | PRN

## 2024-09-07 MED ORDER — ALUM & MAG HYDROXIDE-SIMETH 200-200-20 MG/5ML PO SUSP: 30.0000 mL | ORAL | Status: DC | PRN

## 2024-09-07 MED ORDER — HALOPERIDOL LACTATE 5 MG/ML IJ SOLN: 5.0000 mg | Freq: Three times a day (TID) | INTRAMUSCULAR | Status: DC | PRN

## 2024-09-07 MED ORDER — DIPHENHYDRAMINE HCL 50 MG PO CAPS: 50.0000 mg | ORAL_CAPSULE | Freq: Three times a day (TID) | ORAL | Status: DC | PRN

## 2024-09-07 MED ORDER — MAGNESIUM HYDROXIDE 400 MG/5ML PO SUSP: 30.0000 mL | Freq: Every day | ORAL | Status: DC | PRN
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
## 2024-09-07 NOTE — ED Notes (Signed)
 RN spoke with patient A&Ox4 and very familiar with [facility] rules and protocols.Patient instructing RN on next steps in order to get onto floor.She denies intent to harm self/others when asked. Denies A/VH or any physical complaints when asked. No acute distress noted. Active listening, support and encouragement provided. Routine safety checks conducted according to facility protocol. Encouraged patient to notify staff if thoughts of harm toward self or others arise. Patient verbalize understanding and agreement. Meal, toiletries and tour offered.
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
## 2024-09-07 NOTE — ED Provider Notes (Signed)
 [facility] Continuous Assessment Admission H&P  Date: 09/07/24 Patient Name: Melinda Jimenez MRN: [id number] Chief Complaint: Family discord  Diagnoses:  Final diagnoses:  Family discord  Recurrent major depressive disorder, remission status unspecified  Homeless    HPI: Melinda Jimenez, 38 y/o female with a history of severe depression, suicidal ideation, GAD, bipolar disorder 1, borderline personality disorder, presented to [facility] via [facility], according to the patient she is stressed out and she just got out of jail today.  Stated she went to jail because she did not appear in court for a charge against threatening someone.  I review of patient records show the patient was recently seen and admitted on 09/02/24 last week.  Patient also reports she is currently homeless.  Patient stated I just feel like something bad is going to happen to me.  According to patient she and her particular family member got into an argument and she was asked to leave and if not the police would have been called.  Update from triage note: Pt presents to [facility] as a voluntary walk-in, accompanied by [employer] with complaint of stress and family discord.Pt reports being released from jail today and was planning to pick up clothing from a relatives home. Pt and this particular relative got into an argument and she was asked to leave and if not, police would be called. Pt reports that she called police because she feels off. Pt reports feeling paranoid or sense of impending doom. She stated  I just feel like something bad is going to happen to me. Pt reports diagnosis of Bipolar and PTSD. Pt states she is prescribed Abilify , but she has not had it about four days. Pt currently denies SI,HI,AVH and substance/alcohol use. Patient says that she feels confused. She said she called the police because I don't know what to do. When asked about suicidality she says I don't know, I just want to give up. She denies a suicide plan but says  I just want to die. No HI or A/V hallucinations. Patient denies use of ETOH or other substances. When asked what her stressors were she says everything. She denies access to guns. When this clinician asked her about who presribes her medications she says I don't know and says the same thing regarding what her medications are.   Face-to-face evaluation of patient, patient is alert and oriented x 4, speech is clear, maintained minimal eye contact.  Patient denied current SI, HI, AVH or paranoia.  Patient denies substance abuse denies alcohol use.  Present moment patient does not seem to be influenced by internal stimuli, denies access to guns.  However given patient prior admission for suicidal ideation and subsequent admissions writer discussed with patient that we will keep her for observation and reassessment in the a.m.  Total Time spent with patient: 30 minutes  Musculoskeletal  Strength & Muscle Tone: within normal limits Gait & Station: normal Patient leans: N/A  Psychiatric Specialty Exam  Presentation General Appearance:  Appropriate for Environment  Eye Contact: Fair  Speech: Clear and Coherent  Speech Volume: Normal  Handedness: Right   Mood and Affect  Mood: Anxious; Depressed  Affect: Congruent   Thought Process  Thought Processes: Coherent  Descriptions of Associations:Intact  Orientation:Full (Time, Place and Person)  Thought Content:WDL  Diagnosis of Schizophrenia or Schizoaffective disorder in past: No  Duration of Psychotic Symptoms: Less than six months  Hallucinations:Hallucinations: None  Ideas of Reference:None  Suicidal Thoughts:Suicidal Thoughts: Yes, Active SI Passive Intent  and/or Plan: With Intent; With Plan  Homicidal Thoughts:Homicidal Thoughts: No   Sensorium  Memory: Immediate Fair  Judgment: Fair  Insight: Fair   Chartered Certified Accountant: Fair  Attention Span: Fair  Recall: [employer] of  Knowledge: Fair  Language: Fair   Psychomotor Activity  Psychomotor Activity: Psychomotor Activity: Normal   Assets  Assets: Desire for Improvement; Resilience; Vocational/Educational   Sleep  Sleep: Sleep: Fair Number of Hours of Sleep: 8   Nutritional Assessment (For OBS and FBC admissions only) Has the patient had a weight loss or gain of 10 pounds or more in the last 3 months?: No Has the patient had a decrease in food intake/or appetite?: No Does the patient have dental problems?: No Does the patient have eating habits or behaviors that may be indicators of an eating disorder including binging or inducing vomiting?: No Has the patient recently lost weight without trying?: 0 Has the patient been eating poorly because of a decreased appetite?: 0 Malnutrition Screening Tool Score: 0    Physical Exam HENT:     Head: Normocephalic.     Nose: Nose normal.  Eyes:     Pupils: Pupils are equal, round, and reactive to light.  Cardiovascular:     Rate and Rhythm: Normal rate.  Pulmonary:     Effort: Pulmonary effort is normal.  Musculoskeletal:        General: Normal range of motion.     Cervical back: Normal range of motion.  Neurological:     General: No focal deficit present.     Mental Status: She is alert.  Psychiatric:        Mood and Affect: Mood normal.        Behavior: Behavior normal.        Thought Content: Thought content normal.        Judgment: Judgment normal.    Review of Systems  Constitutional: Negative.   HENT: Negative.    Eyes: Negative.   Respiratory: Negative.    Cardiovascular: Negative.   Gastrointestinal: Negative.   Genitourinary: Negative.   Musculoskeletal: Negative.   Skin: Negative.   Neurological: Negative.   Psychiatric/Behavioral:  Positive for depression and suicidal ideas. The patient is nervous/anxious.     Blood pressure 131/87, pulse 86, temperature 98.1 F (36.7 C), temperature source Oral, resp. rate 18, SpO2  99%. There is no height or weight on file to calculate BMI.  Past Psychiatric History: MDD, bipolar disorder, borderline personality disorder, SI  Is the patient at risk to self? No  Has the patient been a risk to self in the past 6 months? Yes .    Has the patient been a risk to self within the distant past? Yes   Is the patient a risk to others? No   Has the patient been a risk to others in the past 6 months? No   Has the patient been a risk to others within the distant past? No   Past Medical History: See chart  Family History: Unknown  Social History: Denies  Last Labs:  Admission on 09/02/2024, Discharged on 09/03/2024  Component Date Value Ref Range Status   POC Amphetamine UR 09/02/2024 None Detected  NONE DETECTED (Cut Off Level 1000 ng/mL) Final   POC Secobarbital (BAR) 09/02/2024 None Detected  NONE DETECTED (Cut Off Level 300 ng/mL) Final   POC Buprenorphine (BUP) 09/02/2024 None Detected  NONE DETECTED (Cut Off Level 10 ng/mL) Final   POC Oxazepam (BZO) 09/02/2024 None  Detected  NONE DETECTED (Cut Off Level 300 ng/mL) Final   POC Cocaine UR 09/02/2024 None Detected  NONE DETECTED (Cut Off Level 300 ng/mL) Final   POC Methamphetamine UR 09/02/2024 None Detected  NONE DETECTED (Cut Off Level 1000 ng/mL) Final   POC Morphine  09/02/2024 None Detected  NONE DETECTED (Cut Off Level 300 ng/mL) Final   POC Methadone UR 09/02/2024 None Detected  NONE DETECTED (Cut Off Level 300 ng/mL) Final   POC Oxycodone  UR 09/02/2024 None Detected  NONE DETECTED (Cut Off Level 100 ng/mL) Final   POC Marijuana UR 09/02/2024 None Detected  NONE DETECTED (Cut Off Level 50 ng/mL) Final   Preg Test, Ur 09/02/2024 Negative  Negative Final  Admission on 09/02/2024, Discharged on 09/02/2024  Component Date Value Ref Range Status   SARS Coronavirus 2 by RT PCR 09/02/2024 NEGATIVE  NEGATIVE Final   Influenza A by PCR 09/02/2024 NEGATIVE  NEGATIVE Final   Influenza B by PCR 09/02/2024 NEGATIVE  NEGATIVE  Final   Comment: (NOTE) The Xpert Xpress SARS-CoV-2/FLU/RSV plus assay is intended as an aid in the diagnosis of influenza from Nasopharyngeal swab specimens and should not be used as a sole basis for treatment. Nasal washings and aspirates are unacceptable for Xpert Xpress SARS-CoV-2/FLU/RSV testing.  Fact Sheet for Patients: bloggercourse.com  Fact Sheet for Healthcare Providers: seriousbroker.it  This test is not yet approved or cleared by the [place]  FDA and has been authorized for detection and/or diagnosis of SARS-CoV-2 by FDA under an Emergency Use Authorization (EUA). This EUA will remain in effect (meaning this test can be used) for the duration of the COVID-19 declaration under Section 564(b)(1) of the Act, 21 U.S.C. section 360bbb-3(b)(1), unless the authorization is terminated or revoked.     Resp Syncytial Virus by PCR 09/02/2024 NEGATIVE  NEGATIVE Final   Comment: (NOTE) Fact Sheet for Patients: bloggercourse.com  Fact Sheet for Healthcare Providers: seriousbroker.it  This test is not yet approved or cleared by the [place]  FDA and has been authorized for detection and/or diagnosis of SARS-CoV-2 by FDA under an Emergency Use Authorization (EUA). This EUA will remain in effect (meaning this test can be used) for the duration of the COVID-19 declaration under Section 564(b)(1) of the Act, 21 U.S.C. section 360bbb-3(b)(1), unless the authorization is terminated or revoked.  Performed at [facility], [address], [place], [place] [address]   Admission on 08/12/2024, Discharged on 08/13/2024  Component Date Value Ref Range Status   RPR Ser Ql 08/12/2024 NON REACTIVE  NON REACTIVE Final   Performed at [facility], [address] [place], [place] [address]   Neisseria Gonorrhea 08/12/2024 Negative   Final   Chlamydia 08/12/2024 Negative    Final   Comment 08/12/2024 Normal Reference Ranger Chlamydia - Negative   Final   Comment 08/12/2024 Normal Reference Range Neisseria Gonorrhea - Negative   Final   TSH 08/12/2024 0.595  0.350 - 4.500 uIU/mL Final   Comment: Performed by a 3rd Generation assay with a functional sensitivity of <=0.01 uIU/mL. Performed at [facility], [address], [place], [place] [address]    Cholesterol 08/12/2024 126  0 - 200 mg/dL Final   Triglycerides 88/87/7974 93  <150 mg/dL Final   HDL 88/87/7974 35 (L)  >40 mg/dL Final   Total CHOL/HDL Ratio 08/12/2024 3.6  RATIO Final   VLDL 08/12/2024 19  0 - 40 mg/dL Final   LDL Cholesterol 08/12/2024 72  0 - 99 mg/dL  Final   Comment:        Total Cholesterol/HDL:CHD Risk Coronary Heart Disease Risk Table                     Men   Women  1/2 Average Risk   3.4   3.3  Average Risk       5.0   4.4  2 X Average Risk   9.6   7.1  3 X Average Risk  23.4   11.0        Use the calculated Patient Ratio above and the CHD Risk Table to determine the patient's CHD Risk.        ATP III CLASSIFICATION (LDL):  <100     mg/dL   Optimal  899-870  mg/dL   Near or Above                    Optimal  130-159  mg/dL   Borderline  839-810  mg/dL   High  >809     mg/dL   Very High Performed at [facility], [address] [place], [place] [address]    Magnesium  08/12/2024 2.0  1.7 - 2.4 mg/dL Final   Performed at [facility], [address] [place], [place] [address]   Hgb A1c MFr Bld 08/12/2024 6.0 (H)  4.8 - 5.6 % Final   Comment: (NOTE) Diagnosis of Diabetes The following HbA1c ranges recommended by the American Diabetes Association (ADA) may be used as an aid in the diagnosis of diabetes mellitus.  Hemoglobin             Suggested A1C NGSP%              Diagnosis  <5.7                   Non Diabetic  5.7-6.4                Pre-Diabetic  >6.4                   Diabetic  <7.0                   Glycemic control for                        adults with diabetes.     Mean Plasma Glucose 08/12/2024 125.5  mg/dL Final   Performed at [facility], [address] [place], [place] [address]   Sodium 08/12/2024 138  135 - 145 mmol/L Final   Potassium 08/12/2024 3.6  3.5 - 5.1 mmol/L Final   Chloride 08/12/2024 102  98 - 111 mmol/L Final   CO2 08/12/2024 24  22 - 32 mmol/L Final   Glucose, Bld 08/12/2024 108 (H)  70 - 99 mg/dL Final   Glucose reference range applies only to samples taken after fasting for at least 8 hours.   BUN 08/12/2024 14  6 - 20 mg/dL Final   Creatinine, Ser 08/12/2024 0.67  0.44 - 1.00 mg/dL Final   Calcium  08/12/2024 9.4  8.9 - 10.3 mg/dL Final   Total Protein 88/87/7974 8.9 (H)  6.5 - 8.1 g/dL Final   Albumin 88/87/7974 3.9  3.5 - 5.0 g/dL Final   AST 88/87/7974 16  15 - 41 U/L Final   ALT 08/12/2024 15  0 - 44 U/L Final   Alkaline Phosphatase 08/12/2024 63  38 - 126 U/L Final   Total Bilirubin 08/12/2024 0.6  0.0 - 1.2 mg/dL Final   GFR, Estimated 08/12/2024 >60  >60 mL/min Final   Comment: (NOTE) Calculated using the CKD-EPI Creatinine Equation (2021)    Anion gap 08/12/2024 12  5 - 15 Final   Performed at [facility], [address] [place], [place] [address]   WBC 08/12/2024 12.2 (H)  4.0 - 10.5 K/uL Final   RBC 08/12/2024 5.25 (H)  3.87 - 5.11 MIL/uL Final   Hemoglobin 08/12/2024 10.6 (L)  12.0 - 15.0 g/dL Final   HCT 88/87/7974 35.8 (L)  36.0 - 46.0 % Final   MCV 08/12/2024 68.2 (L)  80.0 - 100.0 fL Final   MCH 08/12/2024 20.2 (L)  26.0 - 34.0 pg Final   MCHC 08/12/2024 29.6 (L)  30.0 - 36.0 g/dL Final   RDW 88/87/7974 20.2 (H)  11.5 - 15.5 % Final   Platelets 08/12/2024 405 (H)  150 - 400 K/uL Final   nRBC 08/12/2024 0.0  0.0 - 0.2 % Final   Neutrophils Relative % 08/12/2024 53  % Final   Neutro Abs 08/12/2024 6.3  1.7 - 7.7 K/uL Final   Lymphocytes Relative 08/12/2024 42  % Final   Lymphs Abs 08/12/2024 5.2 (H)  0.7 - 4.0 K/uL Final   Monocytes Relative 08/12/2024 4  %  Final   Monocytes Absolute 08/12/2024 0.5  0.1 - 1.0 K/uL Final   Eosinophils Relative 08/12/2024 1  % Final   Eosinophils Absolute 08/12/2024 0.1  0.0 - 0.5 K/uL Final   Basophils Relative 08/12/2024 0  % Final   Basophils Absolute 08/12/2024 0.0  0.0 - 0.1 K/uL Final   WBC Morphology 08/12/2024 MORPHOLOGY UNREMARKABLE   Final   Smear Review 08/12/2024 Normal platelet morphology   Final   Immature Granulocytes 08/12/2024 0  % Final   Abs Immature Granulocytes 08/12/2024 0.03  0.00 - 0.07 K/uL Final   Polychromasia 08/12/2024 PRESENT   Final   Performed at [facility], [address], [place], [place] [address]   Hepatitis B Surface Ag 08/12/2024 NON REACTIVE  NON REACTIVE Final   HCV Ab 08/12/2024 NON REACTIVE  NON REACTIVE Final   Comment: (NOTE) Nonreactive HCV antibody screen is consistent with no HCV infections,  unless recent infection is suspected or other evidence exists to indicate HCV infection.     Hep A IgM 08/12/2024 NON REACTIVE  NON REACTIVE Final   Hep B C IgM 08/12/2024 NON REACTIVE  NON REACTIVE Final   Performed at [facility], [address] [place], [place] [address]   Preg Test, Ur 08/12/2024 Negative  Negative Final   POC Amphetamine UR 08/12/2024 None Detected  NONE DETECTED (Cut Off Level 1000 ng/mL) Final   POC Secobarbital (BAR) 08/12/2024 None Detected  NONE DETECTED (Cut Off Level 300 ng/mL) Final   POC Buprenorphine (BUP) 08/12/2024 None Detected  NONE DETECTED (Cut Off Level 10 ng/mL) Final   POC Oxazepam (BZO) 08/12/2024 None Detected  NONE DETECTED (Cut Off Level 300 ng/mL) Final   POC Cocaine UR 08/12/2024 None Detected  NONE DETECTED (Cut Off Level 300 ng/mL) Final   POC Methamphetamine UR 08/12/2024 None Detected  NONE DETECTED (Cut Off Level 1000 ng/mL) Final   POC Morphine  08/12/2024 None Detected  NONE DETECTED (Cut Off Level 300 ng/mL) Final   POC Methadone UR 08/12/2024 None Detected  NONE DETECTED (Cut Off Level 300 ng/mL) Final  POC Oxycodone  UR 08/12/2024 None Detected  NONE DETECTED (Cut Off Level 100 ng/mL) Final   POC Marijuana UR 08/12/2024 None Detected  NONE DETECTED (Cut Off Level 50 ng/mL) Final  Admission on 07/28/2024, Discharged on 07/31/2024  Component Date Value Ref Range Status   Cholesterol 07/29/2024 116  0 - 200 mg/dL Final   Comment:        ATP III CLASSIFICATION:  <200     mg/dL   Desirable  799-760  mg/dL   Borderline High  >=759    mg/dL   High           Triglycerides 07/29/2024 56  <150 mg/dL Final   HDL 89/70/7974 33 (L)  >40 mg/dL Final   Total CHOL/HDL Ratio 07/29/2024 3.5  RATIO Final   VLDL 07/29/2024 11  0 - 40 mg/dL Final   LDL Cholesterol 07/29/2024 71  0 - 99 mg/dL Final   Comment:        Total Cholesterol/HDL:CHD Risk Coronary Heart Disease Risk Table                     Men   Women  1/2 Average Risk   3.4   3.3  Average Risk       5.0   4.4  2 X Average Risk   9.6   7.1  3 X Average Risk  23.4   11.0        Use the calculated Patient Ratio above and the CHD Risk Table to determine the patient's CHD Risk.        ATP III CLASSIFICATION (LDL):  <100     mg/dL   Optimal  899-870  mg/dL   Near or Above                    Optimal  130-159  mg/dL   Borderline  839-810  mg/dL   High  >809     mg/dL   Very High Performed at [facility], [address] [place], [place] [address]    Vit D, 25-Hydroxy 07/30/2024 11.48 (L)  30 - 100 ng/mL Final   Comment: (NOTE) Vitamin D  deficiency has been defined by the Institute of Medicine  and an Endocrine Society practice guideline as a level of serum 25-OH  vitamin D  less than 20 ng/mL (1,2). The Endocrine Society went on to  further define vitamin D  insufficiency as a level between 21 and 29  ng/mL (2).  1. IOM (Institute of Medicine). 2010. Dietary reference intakes for  calcium  and D. [place]: [employer]. 2. Holick MF, Binkley [place], Bischoff-Ferrari HA, et al. Evaluation,   treatment, and prevention of vitamin D  deficiency: an Endocrine  Society clinical practice guideline, JCEM. 2011 Jul; 96(7): 1911-30.  Performed at [facility], [address], [place], [place] [address]   Admission on 07/27/2024, Discharged on 07/28/2024  Component Date Value Ref Range Status   WBC 07/28/2024 10.4  4.0 - 10.5 K/uL Final   RBC 07/28/2024 4.98  3.87 - 5.11 MIL/uL Final   Hemoglobin 07/28/2024 10.1 (L)  12.0 - 15.0 g/dL Final   HCT 89/71/7974 34.4 (L)  36.0 - 46.0 % Final   MCV 07/28/2024 69.1 (L)  80.0 - 100.0 fL Final   MCH 07/28/2024 20.3 (L)  26.0 - 34.0 pg Final   MCHC 07/28/2024 29.4 (L)  30.0 - 36.0 g/dL Final   RDW 89/71/7974 19.6 (H)  11.5 -  15.5 % Final   Platelets 07/28/2024 389  150 - 400 K/uL Final   REPEATED TO VERIFY   nRBC 07/28/2024 0.0  0.0 - 0.2 % Final   Neutrophils Relative % 07/28/2024 50  % Final   Neutro Abs 07/28/2024 5.2  1.7 - 7.7 K/uL Final   Lymphocytes Relative 07/28/2024 42  % Final   Lymphs Abs 07/28/2024 4.3 (H)  0.7 - 4.0 K/uL Final   Monocytes Relative 07/28/2024 6  % Final   Monocytes Absolute 07/28/2024 0.6  0.1 - 1.0 K/uL Final   Eosinophils Relative 07/28/2024 1  % Final   Eosinophils Absolute 07/28/2024 0.1  0.0 - 0.5 K/uL Final   Basophils Relative 07/28/2024 1  % Final   Basophils Absolute 07/28/2024 0.1  0.0 - 0.1 K/uL Final   WBC Morphology 07/28/2024 MORPHOLOGY UNREMARKABLE   Final   RBC Morphology 07/28/2024 MORPHOLOGY UNREMARKABLE   Final   Smear Review 07/28/2024 Normal platelet morphology   Final   Immature Granulocytes 07/28/2024 0  % Final   Abs Immature Granulocytes 07/28/2024 0.04  0.00 - 0.07 K/uL Final   Performed at [facility], [address], [place], [place] [address]   Sodium 07/28/2024 137  135 - 145 mmol/L Final   Potassium 07/28/2024 4.2  3.5 - 5.1 mmol/L Final   Chloride 07/28/2024 101  98 - 111 mmol/L Final   CO2 07/28/2024 25  22 - 32 mmol/L Final   Glucose, Bld 07/28/2024 95  70 -  99 mg/dL Final   Glucose reference range applies only to samples taken after fasting for at least 8 hours.   BUN 07/28/2024 13  6 - 20 mg/dL Final   Creatinine, Ser 07/28/2024 0.75  0.44 - 1.00 mg/dL Final   Calcium  07/28/2024 9.4  8.9 - 10.3 mg/dL Final   Total Protein 89/71/7974 8.0  6.5 - 8.1 g/dL Final   Albumin 89/71/7974 3.9  3.5 - 5.0 g/dL Final   AST 89/71/7974 15  15 - 41 U/L Final   ALT 07/28/2024 11  0 - 44 U/L Final   Alkaline Phosphatase 07/28/2024 65  38 - 126 U/L Final   Total Bilirubin 07/28/2024 0.7  0.0 - 1.2 mg/dL Final   GFR, Estimated 07/28/2024 >60  >60 mL/min Final   Comment: (NOTE) Calculated using the CKD-EPI Creatinine Equation (2021)    Anion gap 07/28/2024 11  5 - 15 Final   Performed at [facility], [address] [place], [place] [address]   Hgb A1c MFr Bld 07/28/2024 5.9 (H)  4.8 - 5.6 % Final   Comment: (NOTE) Diagnosis of Diabetes The following HbA1c ranges recommended by the American Diabetes Association (ADA) may be used as an aid in the diagnosis of diabetes mellitus.  Hemoglobin             Suggested A1C NGSP%              Diagnosis  <5.7                   Non Diabetic  5.7-6.4                Pre-Diabetic  >6.4                   Diabetic  <7.0                   Glycemic control for  adults with diabetes.     Mean Plasma Glucose 07/28/2024 122.63  mg/dL Final   Performed at [facility], [address] [place], [place] [address]   Preg Test, Ur 07/27/2024 Negative  Negative Final   POC Amphetamine UR 07/27/2024 None Detected  NONE DETECTED (Cut Off Level 1000 ng/mL) Final   POC Secobarbital (BAR) 07/27/2024 None Detected  NONE DETECTED (Cut Off Level 300 ng/mL) Final   POC Buprenorphine (BUP) 07/27/2024 None Detected  NONE DETECTED (Cut Off Level 10 ng/mL) Final   POC Oxazepam (BZO) 07/27/2024 None Detected  NONE DETECTED (Cut Off Level 300 ng/mL) Final   POC Cocaine UR 07/27/2024 None Detected  NONE  DETECTED (Cut Off Level 300 ng/mL) Final   POC Methamphetamine UR 07/27/2024 None Detected  NONE DETECTED (Cut Off Level 1000 ng/mL) Final   POC Morphine  07/27/2024 None Detected  NONE DETECTED (Cut Off Level 300 ng/mL) Final   POC Methadone UR 07/27/2024 None Detected  NONE DETECTED (Cut Off Level 300 ng/mL) Final   POC Oxycodone  UR 07/27/2024 None Detected  NONE DETECTED (Cut Off Level 100 ng/mL) Final   POC Marijuana UR 07/27/2024 None Detected  NONE DETECTED (Cut Off Level 50 ng/mL) Final  Admission on 06/22/2024, Discharged on 06/23/2024  Component Date Value Ref Range Status   WBC 06/22/2024 10.4  4.0 - 10.5 K/uL Final   RBC 06/22/2024 4.93  3.87 - 5.11 MIL/uL Final   Hemoglobin 06/22/2024 10.1 (L)  12.0 - 15.0 g/dL Final   HCT 90/77/7974 34.2 (L)  36.0 - 46.0 % Final   MCV 06/22/2024 69.4 (L)  80.0 - 100.0 fL Final   MCH 06/22/2024 20.5 (L)  26.0 - 34.0 pg Final   MCHC 06/22/2024 29.5 (L)  30.0 - 36.0 g/dL Final   RDW 90/77/7974 19.3 (H)  11.5 - 15.5 % Final   Platelets 06/22/2024 377  150 - 400 K/uL Final   REPEATED TO VERIFY   nRBC 06/22/2024 0.0  0.0 - 0.2 % Final   Neutrophils Relative % 06/22/2024 52  % Final   Neutro Abs 06/22/2024 5.4  1.7 - 7.7 K/uL Final   Lymphocytes Relative 06/22/2024 40  % Final   Lymphs Abs 06/22/2024 4.2 (H)  0.7 - 4.0 K/uL Final   Monocytes Relative 06/22/2024 6  % Final   Monocytes Absolute 06/22/2024 0.6  0.1 - 1.0 K/uL Final   Eosinophils Relative 06/22/2024 1  % Final   Eosinophils Absolute 06/22/2024 0.1  0.0 - 0.5 K/uL Final   Basophils Relative 06/22/2024 1  % Final   Basophils Absolute 06/22/2024 0.1  0.0 - 0.1 K/uL Final   Immature Granulocytes 06/22/2024 0  % Final   Abs Immature Granulocytes 06/22/2024 0.02  0.00 - 0.07 K/uL Final   Performed at [facility], [address] [place], [place] [address]   Sodium 06/22/2024 135  135 - 145 mmol/L Final   Potassium 06/22/2024 3.2 (L)  3.5 - 5.1 mmol/L Final   Chloride 06/22/2024  100  98 - 111 mmol/L Final   CO2 06/22/2024 23  22 - 32 mmol/L Final   Glucose, Bld 06/22/2024 148 (H)  70 - 99 mg/dL Final   Glucose reference range applies only to samples taken after fasting for at least 8 hours.   BUN 06/22/2024 13  6 - 20 mg/dL Final   Creatinine, Ser 06/22/2024 0.74  0.44 - 1.00 mg/dL Final   Calcium  06/22/2024 9.1  8.9 - 10.3 mg/dL Final  Total Protein 06/22/2024 7.9  6.5 - 8.1 g/dL Final   Albumin 90/77/7974 3.5  3.5 - 5.0 g/dL Final   AST 90/77/7974 16  15 - 41 U/L Final   ALT 06/22/2024 9  0 - 44 U/L Final   Alkaline Phosphatase 06/22/2024 61  38 - 126 U/L Final   Total Bilirubin 06/22/2024 0.8  0.0 - 1.2 mg/dL Final   GFR, Estimated 06/22/2024 >60  >60 mL/min Final   Comment: (NOTE) Calculated using the CKD-EPI Creatinine Equation (2021)    Anion gap 06/22/2024 12  5 - 15 Final   Performed at [facility], [address], [place], [place] [address]   Color, Urine 06/22/2024 YELLOW  YELLOW Final   APPearance 06/22/2024 HAZY (A)  CLEAR Final   Specific Gravity, Urine 06/22/2024 1.019  1.005 - 1.030 Final   pH 06/22/2024 5.0  5.0 - 8.0 Final   Glucose, UA 06/22/2024 NEGATIVE  NEGATIVE mg/dL Final   Hgb urine dipstick 06/22/2024 LARGE (A)  NEGATIVE Final   Bilirubin Urine 06/22/2024 NEGATIVE  NEGATIVE Final   Ketones, ur 06/22/2024 NEGATIVE  NEGATIVE mg/dL Final   Protein, ur 90/77/7974 30 (A)  NEGATIVE mg/dL Final   Nitrite 90/77/7974 NEGATIVE  NEGATIVE Final   Leukocytes,Ua 06/22/2024 SMALL (A)  NEGATIVE Final   RBC / HPF 06/22/2024 >50  0 - 5 RBC/hpf Final   WBC, UA 06/22/2024 6-10  0 - 5 WBC/hpf Final   Bacteria, UA 06/22/2024 FEW (A)  NONE SEEN Final   Squamous Epithelial / HPF 06/22/2024 0-5  0 - 5 /HPF Final   Mucus 06/22/2024 PRESENT   Final   Performed at [facility], [address], [place], [place] [address]   hCG, Beta Chain, Quant, S 06/22/2024 <1  <5 mIU/mL Final   Comment:          GEST. AGE      CONC.  (mIU/mL)   <=1 WEEK         5 - 50     2 WEEKS       50 - 500     3 WEEKS       100 - 10,000     4 WEEKS     1,000 - 30,000     5 WEEKS     3,500 - 115,000   6-8 WEEKS     12,000 - 270,000    12 WEEKS     15,000 - 220,000        FEMALE AND NON-PREGNANT FEMALE:     LESS THAN 5 mIU/mL Performed at [facility], [address] [place], [place] [address]    Lipase 06/22/2024 20  11 - 51 U/L Final   Performed at [facility], [address] [place], [place] [address]   Specimen Description 06/23/2024 URINE, CLEAN CATCH   Final   Special Requests 06/23/2024    Final                   Value:NONE Performed at [facility], [address] [place], [place] [address]    Culture 06/23/2024 MULTIPLE SPECIES PRESENT, SUGGEST RECOLLECTION (A)   Final   Report Status 06/23/2024 06/23/2024 FINAL   Final   Yeast Wet Prep HPF POC 06/23/2024 NONE SEEN  NONE SEEN Final   Trich, Wet Prep 06/23/2024 NONE SEEN  NONE SEEN Final   Clue Cells Wet Prep HPF POC 06/23/2024 NONE SEEN  NONE SEEN  Final   WBC, Wet Prep HPF POC 06/23/2024 <10  <10 Final   Sperm 06/23/2024 NONE SEEN   Final   Performed at [facility], [address] [place], [place] [address]   Neisseria Gonorrhea 06/23/2024 Negative   Final   Chlamydia 06/23/2024 Negative   Final   Comment 06/23/2024 Normal Reference Ranger Chlamydia - Negative   Final   Comment 06/23/2024 Normal Reference Range Neisseria Gonorrhea - Negative   Final  Admission on 05/31/2024, Discharged on 05/31/2024  Component Date Value Ref Range Status   Specimen Source 05/31/2024 URINE, CLEAN CATCH   Final   Color, Urine 05/31/2024 YELLOW  YELLOW Final   APPearance 05/31/2024 CLEAR  CLEAR Final   Specific Gravity, Urine 05/31/2024 1.017  1.005 - 1.030 Final   pH 05/31/2024 5.0  5.0 - 8.0 Final   Glucose, UA 05/31/2024 NEGATIVE  NEGATIVE mg/dL Final   Hgb urine dipstick 05/31/2024 NEGATIVE  NEGATIVE Final   Bilirubin Urine 05/31/2024 NEGATIVE  NEGATIVE Final    Ketones, ur 05/31/2024 NEGATIVE  NEGATIVE mg/dL Final   Protein, ur 91/68/7974 NEGATIVE  NEGATIVE mg/dL Final   Nitrite 91/68/7974 NEGATIVE  NEGATIVE Final   Leukocytes,Ua 05/31/2024 NEGATIVE  NEGATIVE Final   RBC / HPF 05/31/2024 0-5  0 - 5 RBC/hpf Final   WBC, UA 05/31/2024 0-5  0 - 5 WBC/hpf Final   Comment:        Reflex urine culture not performed if WBC <=10, OR if Squamous epithelial cells >5. If Squamous epithelial cells >5 suggest recollection.    Bacteria, UA 05/31/2024 RARE (A)  NONE SEEN Final   Squamous Epithelial / HPF 05/31/2024 0-5  0 - 5 /HPF Final   Mucus 05/31/2024 PRESENT   Final   Performed at [facility], [address] [place], [place] [address]  Admission on 05/07/2024, Discharged on 05/07/2024  Component Date Value Ref Range Status   Color, Urine 05/07/2024 YELLOW  YELLOW Final   APPearance 05/07/2024 CLEAR  CLEAR Final   Specific Gravity, Urine 05/07/2024 1.014  1.005 - 1.030 Final   pH 05/07/2024 7.0  5.0 - 8.0 Final   Glucose, UA 05/07/2024 NEGATIVE  NEGATIVE mg/dL Final   Hgb urine dipstick 05/07/2024 NEGATIVE  NEGATIVE Final   Bilirubin Urine 05/07/2024 NEGATIVE  NEGATIVE Final   Ketones, ur 05/07/2024 NEGATIVE  NEGATIVE mg/dL Final   Protein, ur 91/92/7974 NEGATIVE  NEGATIVE mg/dL Final   Nitrite 91/92/7974 NEGATIVE  NEGATIVE Final   Leukocytes,Ua 05/07/2024 NEGATIVE  NEGATIVE Final   Performed at [facility], [address] [place], [place] [address]  Admission on 05/02/2024, Discharged on 05/02/2024  Component Date Value Ref Range Status   WBC 05/02/2024 9.4  4.0 - 10.5 K/uL Final   RBC 05/02/2024 4.77  3.87 - 5.11 MIL/uL Final   Hemoglobin 05/02/2024 9.7 (L)  12.0 - 15.0 g/dL Final   HCT 91/97/7974 33.2 (L)  36.0 - 46.0 % Final   MCV 05/02/2024 69.6 (L)  80.0 - 100.0 fL Final   MCH 05/02/2024 20.3 (L)  26.0 - 34.0 pg Final   MCHC 05/02/2024 29.2 (L)  30.0 - 36.0 g/dL Final   RDW 91/97/7974 19.3 (H)  11.5 - 15.5 % Final    Platelets 05/02/2024 350  150 - 400 K/uL Final   nRBC 05/02/2024 0.0  0.0 - 0.2 % Final   Neutrophils Relative % 05/02/2024 62  % Final   Neutro Abs 05/02/2024 5.8  1.7 - 7.7  K/uL Final   Lymphocytes Relative 05/02/2024 32  % Final   Lymphs Abs 05/02/2024 3.0  0.7 - 4.0 K/uL Final   Monocytes Relative 05/02/2024 2  % Final   Monocytes Absolute 05/02/2024 0.2  0.1 - 1.0 K/uL Final   Eosinophils Relative 05/02/2024 2  % Final   Eosinophils Absolute 05/02/2024 0.2  0.0 - 0.5 K/uL Final   Basophils Relative 05/02/2024 2  % Final   Basophils Absolute 05/02/2024 0.2 (H)  0.0 - 0.1 K/uL Final   WBC Morphology 05/02/2024 See Note   Final    Morphology unremarkable   RBC Morphology 05/02/2024 MORPHOLOGY UNREMARKABLE   Final    Morphology unremarkable   Smear Review 05/02/2024 See Note   Final   Comment:  Normal Platelet Morphology Performed at [facility], [address], [place], [place] [address]    Sodium 05/02/2024 137  135 - 145 mmol/L Final   Potassium 05/02/2024 3.6  3.5 - 5.1 mmol/L Final   Chloride 05/02/2024 105  98 - 111 mmol/L Final   CO2 05/02/2024 22  22 - 32 mmol/L Final   Glucose, Bld 05/02/2024 144 (H)  70 - 99 mg/dL Final   Glucose reference range applies only to samples taken after fasting for at least 8 hours.   BUN 05/02/2024 9  6 - 20 mg/dL Final   Creatinine, Ser 05/02/2024 0.74  0.44 - 1.00 mg/dL Final   Calcium  05/02/2024 9.1  8.9 - 10.3 mg/dL Final   Total Protein 91/97/7974 7.7  6.5 - 8.1 g/dL Final   Albumin 91/97/7974 3.4 (L)  3.5 - 5.0 g/dL Final   AST 91/97/7974 13 (L)  15 - 41 U/L Final   ALT 05/02/2024 9  0 - 44 U/L Final   Alkaline Phosphatase 05/02/2024 57  38 - 126 U/L Final   Total Bilirubin 05/02/2024 0.7  0.0 - 1.2 mg/dL Final   GFR, Estimated 05/02/2024 >60  >60 mL/min Final   Comment: (NOTE) Calculated using the CKD-EPI Creatinine Equation (2021)    Anion gap 05/02/2024 10  5 - 15 Final   Performed at [facility], [address], [place], [place] [address]   Lipase 05/02/2024 24  11 - 51 U/L Final   Performed at [facility], [address] [place], [place] [address]  Admission on 03/09/2024, Discharged on 03/15/2024  Component Date Value Ref Range Status   Folate 03/10/2024 9.7  >5.9 ng/mL Final   Performed at [facility], [address], [place], [place] [address]   RPR Ser Ql 03/10/2024 NON REACTIVE  NON REACTIVE Final   Performed at [facility], [address] [place], [place] [address]   Vitamin B-12 03/10/2024 252  180 - 914 pg/mL Final   Comment: (NOTE) This assay is not validated for testing neonatal or myeloproliferative syndrome specimens for Vitamin B12 levels. Performed at [facility], [address], [place], [place] [address]    HIV Screen 4th Generation wRfx 03/10/2024 Non Reactive  Non Reactive Final   Performed at [facility], [address] [place], [place] [address]  There may be more visits with results that are not included.    Allergies: Latex, Zithromax  [azithromycin ], and Zofran  [ondansetron ]  Medications:  PTA Medications  Medication Sig   ibuprofen  (ADVIL ) 600 MG tablet Take 1 tablet (600 mg total) by mouth every 6 (six) hours as needed for moderate pain (pain score 4-6).  ARIPiprazole  (ABILIFY ) 15 MG tablet Take 1 tablet (15 mg total) by mouth daily.   amoxicillin  (AMOXIL ) 500 MG capsule Take 1 capsule (500 mg total) by mouth 3 (three) times daily.   hydrOXYzine  (ATARAX ) 25 MG tablet Take 1 tablet (25 mg total) by mouth every 8 (eight) hours as needed for anxiety.   traZODone  (DESYREL ) 100 MG tablet Take 1 tablet (100 mg total) by mouth at bedtime as needed for sleep.   benzonatate  (TESSALON ) 200 MG capsule Take 1 capsule (200 mg total) by mouth 3 (three) times daily as needed for cough.   albuterol  (VENTOLIN  HFA) 108 (90 Base) MCG/ACT inhaler Inhale 2 puffs into the lungs every 4 (four) hours as needed for wheezing or  shortness of breath.   Vitamin D , Ergocalciferol , (DRISDOL ) 1.25 MG (50000 UNIT) CAPS capsule Take 1 capsule (50,000 Units total) by mouth every 7 (seven) days.   ferrous sulfate  325 (65 FE) MG tablet Take 1 tablet (325 mg total) by mouth 2 (two) times daily with a meal.   NIFEdipine  (ADALAT  CC) 30 MG 24 hr tablet Take 1 tablet (30 mg total) by mouth daily.      Medical Decision Making  Observation unit    Recommendations  Based on my evaluation the patient does not appear to have an emergency medical condition.  Gaither Pouch, NP 09/07/24  8:48 PM

## 2024-09-08 ENCOUNTER — Other Ambulatory Visit (HOSPITAL_COMMUNITY): Payer: Self-pay

## 2024-09-08 ENCOUNTER — Telehealth (HOSPITAL_COMMUNITY): Payer: Self-pay | Admitting: Pharmacy Technician

## 2024-09-08 LAB — CBC WITH DIFFERENTIAL/PLATELET
Abs Immature Granulocytes: 0.02 K/uL (ref 0.00–0.07)
Basophils Absolute: 0 K/uL (ref 0.0–0.1)
Basophils Relative: 0 %
Eosinophils Absolute: 0.1 K/uL (ref 0.0–0.5)
Eosinophils Relative: 0 %
HCT: 30.4 % — ABNORMAL LOW (ref 36.0–46.0)
Hemoglobin: 10.3 g/dL — ABNORMAL LOW (ref 12.0–15.0)
Immature Granulocytes: 0 %
Lymphocytes Relative: 34 %
Lymphs Abs: 3.8 K/uL (ref 0.7–4.0)
MCH: 25.9 pg — ABNORMAL LOW (ref 26.0–34.0)
MCHC: 33.9 g/dL (ref 30.0–36.0)
MCV: 76.4 fL — ABNORMAL LOW (ref 80.0–100.0)
Monocytes Absolute: 0.6 K/uL (ref 0.1–1.0)
Monocytes Relative: 5 %
Neutro Abs: 6.8 K/uL (ref 1.7–7.7)
Neutrophils Relative %: 61 %
Platelets: 354 K/uL (ref 150–400)
RBC: 3.98 MIL/uL (ref 3.87–5.11)
RDW: 21.9 % — ABNORMAL HIGH (ref 11.5–15.5)
Smear Review: NORMAL
WBC: 11.3 K/uL — ABNORMAL HIGH (ref 4.0–10.5)
nRBC: 0 % (ref 0.0–0.2)

## 2024-09-08 LAB — COMPREHENSIVE METABOLIC PANEL WITH GFR
ALT: 12 U/L (ref 0–44)
AST: 17 U/L (ref 15–41)
Albumin: 3.9 g/dL (ref 3.5–5.0)
Alkaline Phosphatase: 62 U/L (ref 38–126)
Anion gap: 5 (ref 5–15)
BUN: 11 mg/dL (ref 6–20)
CO2: 31 mmol/L (ref 22–32)
Calcium: 8.8 mg/dL — ABNORMAL LOW (ref 8.9–10.3)
Chloride: 101 mmol/L (ref 98–111)
Creatinine, Ser: 0.73 mg/dL (ref 0.44–1.00)
GFR, Estimated: >60 mL/min (ref >60)
Glucose, Bld: 89 mg/dL (ref 70–99)
Potassium: 4 mmol/L (ref 3.5–5.1)
Sodium: 137 mmol/L (ref 135–145)
Total Bilirubin: 1 mg/dL (ref 0.0–1.2)
Total Protein: 7.9 g/dL (ref 6.5–8.1)

## 2024-09-08 LAB — ETHANOL: Alcohol, Ethyl (B): <15 mg/dL (ref <15)

## 2024-09-08 LAB — TSH: TSH: 1.082 u[IU]/mL (ref 0.350–4.500)

## 2024-09-08 MED ORDER — ARIPIPRAZOLE 15 MG PO TABS
15.0000 mg | ORAL_TABLET | Freq: Every day | ORAL | Status: DC
  Filled 2024-09-08: qty 7

## 2024-09-08 NOTE — Care Management (Addendum)
 OBS Care Managemet    1:30pm  Patient met with Harlene and Bri (Peer Support). They will assist [facility] with resources for housing, food and follow up mental health care.    The intake process has been completed so that the Peer Support Specialist is able to work with the patient once she is discharged.   Writer informed the MD working with the patient.    9:25am Writer referred patient to the following Case Management programs: [place] Crime prevention program and the contact person is Herschell Hummer [phone]) Clinical Research Associate provided patient with resource information to the [employer].  Patient will need to contact the [employer] and complete a phone intake interview in order to come into their program.  The Peer Support Specialist is Harlene [phone] and she will be coming to speak with the patient today between 11:30am and 12:30pm Writer provided patient with a listing of community resources for felons due to the patient being released from jail yesterday.

## 2024-09-08 NOTE — Telephone Encounter (Signed)
 Patient Product/process Development Scientist completed.    The patient is insured through [employer].     Ran test claim for Abilify  Maintena 400 mg Prsy and the current 30 day co-pay is $4.00.   This test claim was processed through [facility]- copay amounts may vary at other pharmacies due to pharmacy/plan contracts, or as the patient moves through the different stages of their insurance plan.     Reyes Sharps, CPHT Pharmacy Technician Patient Advocate Specialist Lead [facility] Patient Advocate Team Direct Number: [phone]  Fax: [phone]

## 2024-09-08 NOTE — ED Notes (Signed)
 Pt resting quietly at this time appears to be asleep.  No complaints for hallucination or harmful thoughts.  Will  continue to monitor for safety.

## 2024-09-08 NOTE — ED Provider Notes (Signed)
 Behavioral Health Progress Note  Date and Time: 09/08/2024 11:52 AM Name: Melinda Jimenez MRN:  [id number]  Subjective:  Melinda Jimenez is a 38 year old African-American female who was recently discharged from [facility] on 07/30/2024. Present with past psychiatric history significant for major depressive disorder, recurrent, episode severe generalized anxiety, disorder bipolar 1 disorder severe, borderline personality disorder. PMHx include hypertension, prediabetes, and vitamin D  deficiency.    She came to the [facility] she came back to the behavioral health urgent care last week for assistance in the setting of the recent murder of her brother.  Since that time she has continued to have minimal compliance with medication regimen, numerous conflicts with her family around the grief that they are all experiencing, also had a failure to appear the court hearing, which resulted in her being put in jail overnight recently.  The patient this morning appears to be on the border of a hypomanic state.  She reports that she has had increased mood swings, irritability in the last few days along with poor sleep and racing thoughts.  She admits that she does not like how she feels and that she feels out of control but she is very adamant that she does not want to take a long-acting injectable.  She reports I am not in denial that my disorder is getting worse, I know it is, but I still do not want that injection.  Patient's logic around this difficulty of medication adherence tends to be circular and circumstantial.    Reports: Sleep: Fair Appetite: Fair Depression: About the same as yesterday Anxiety: About the same as yesterday Auditory Hallucinations: Denies Visual Hallucinations: Denies Paranoia: Denies Delusions: Denies SI: Passive SI HI: Denies  Diagnosis:  Final diagnoses:  Family discord  Recurrent major depressive disorder, remission status unspecified  Homeless    Total Time spent with patient: 20  minutes  Past Psychiatric History: Previous Psych Diagnoses: MDD, bipolar disorder, and generalized anxiety. Prior inpatient treatment: X 2, most recently at [facility] in June 2025 Current/prior outpatient treatment: Denies Prior rehab hx: Denies Psychotherapy hx: Yes History of suicide: Denies History of homicide or aggression: Denies Psychiatric medication history: Yes, Abilify  trazodone  and hydroxyzine  Psychiatric medication compliance history: Noncompliance Neuromodulation history: denies Current Psychiatrist: Denies Current therapist: Denies  Past Medical History:  Past Medical History:  Diagnosis Date   Anemia    Anxiety    off meds with preg   Bipolar 1 disorder (HCC) 03/10/2024   Borderline personality disorder (HCC) 03/10/2024   Brief psychotic disorder (HCC) 04/03/2020   Carpal tunnel syndrome    Cesarean delivery delivered 10/10/2020   Chlamydia    Chronic hypertension    Chronic post-traumatic stress disorder (PTSD) 03/10/2024   COVID-19 09/12/2020   Genital HSV    Last OB: > 18yr ago; Valtrex  500 mg po prn outbreaks   GERD (gastroesophageal reflux disease)    GERD without esophagitis 01/20/2013   Gestational diabetes    diet controlled   Goiter, unspecified 04/18/2010   Centricity Description: THYROMEGALY  Qualifier: Diagnosis of   By: Lelon RIGGERS, Scott      Centricity Description: GOITER  Qualifier: Diagnosis of   By: Lelon RIGGERS, Scott      Replacing diagnoses that were inactivated after the 12/31/22 regulatory import     H/O: cesarean section 09/27/2016   last C-section for active HSV, C-section X1: 05/20/13; records in epic [facility]  TOLAC desired, signed 09/27/16      Headache(784.0)    Herpes genitalis in  women 04/18/2010   Qualifier: Diagnosis of   By: Lelon RIGGERS, Scott         IBS (irritable bowel syndrome)    Infection    UTI   Intrauterine pregnancy 03/22/2020   EDC 11/14/2020     Panic attack    Premature rupture of membranes (PROM), onset of labor after  24 hours, antepartum, preterm, third trimester 10/10/2020   Pulmonary embolus (HCC) 10/18/2020   Thyroid  disease    Trichomonas    Vitamin D  deficiency 03/10/2024    Family History:  Family History  Problem Relation Age of Onset   Depression Mother    Anxiety disorder Mother    Breast cancer Other    Bipolar disorder Brother    Diabetes Maternal Grandmother    Cancer Maternal Grandmother    Hypertension Maternal Grandmother    Hypertension Paternal Grandmother    Autism Other    Schizophrenia Other    Social History:  Patient is currently homeless, has had numerous family upheavals over the past couple of years that she becomes tearful when she discusses. Patient lost her brother to murder approximately 8 days ago (12/1).   Additional Social History:    Pain Medications: See d/c summary from [facility] on 07/31/24. Prescriptions: See d/c summary from [facility] on 07/31/24. Over the Counter: See d/c summary from [facility] on 07/31/24. History of alcohol / drug use?: No history of alcohol / drug abuse Longest period of sobriety (when/how long): none reported Withdrawal Symptoms: None       Sleep: Fair  Appetite:  Good  Current Medications:  Current Facility-Administered Medications  Medication Dose Route Frequency Provider Last Rate Last Admin   acetaminophen  (TYLENOL ) tablet 650 mg  650 mg Oral Q6H PRN Trudy Carwin, NP       alum & mag hydroxide-simeth (MAALOX/MYLANTA) 200-200-20 MG/5ML suspension 30 mL  30 mL Oral Q4H PRN Trudy Carwin, NP       haloperidol  (HALDOL ) tablet 5 mg  5 mg Oral TID PRN Trudy Carwin, NP       And   diphenhydrAMINE  (BENADRYL ) capsule 50 mg  50 mg Oral TID PRN Trudy Carwin, NP       haloperidol  lactate (HALDOL ) injection 5 mg  5 mg Intramuscular TID PRN Trudy Carwin, NP       And   diphenhydrAMINE  (BENADRYL ) injection 50 mg  50 mg Intramuscular TID PRN Trudy Carwin, NP       And   LORazepam  (ATIVAN ) injection 2 mg  2 mg Intramuscular TID PRN Trudy Carwin, NP       haloperidol  lactate (HALDOL ) injection 10 mg  10 mg Intramuscular TID PRN Trudy Carwin, NP       And   diphenhydrAMINE  (BENADRYL ) injection 50 mg  50 mg Intramuscular TID PRN Trudy Carwin, NP       And   LORazepam  (ATIVAN ) injection 2 mg  2 mg Intramuscular TID PRN Trudy Carwin, NP       magnesium  hydroxide (MILK OF MAGNESIA) suspension 30 mL  30 mL Oral Daily PRN Trudy Carwin, NP       Current Outpatient Medications  Medication Sig Dispense Refill   albuterol  (VENTOLIN  HFA) 108 (90 Base) MCG/ACT inhaler Inhale 2 puffs into the lungs every 4 (four) hours as needed for wheezing or shortness of breath.     amoxicillin  (AMOXIL ) 500 MG capsule Take 1 capsule (500 mg total) by mouth 3 (three) times daily. (Patient taking differently: Take 500 mg by mouth 3 (  three) times daily. Take for 10 days starting on 09/02/24)     ARIPiprazole  (ABILIFY ) 15 MG tablet Take 1 tablet (15 mg total) by mouth daily.     ferrous sulfate  325 (65 FE) MG tablet Take 1 tablet (325 mg total) by mouth 2 (two) times daily with a meal.     NIFEdipine  (ADALAT  CC) 30 MG 24 hr tablet Take 1 tablet (30 mg total) by mouth daily. 7 tablet 0   Vitamin D , Ergocalciferol , (DRISDOL ) 1.25 MG (50000 UNIT) CAPS capsule Take 1 capsule (50,000 Units total) by mouth every 7 (seven) days.     benzonatate  (TESSALON ) 200 MG capsule Take 1 capsule (200 mg total) by mouth 3 (three) times daily as needed for cough. (Patient not taking: Reported on 09/08/2024) 30 capsule    hydrOXYzine  (ATARAX ) 25 MG tablet Take 1 tablet (25 mg total) by mouth every 8 (eight) hours as needed for anxiety.     ibuprofen  (ADVIL ) 600 MG tablet Take 1 tablet (600 mg total) by mouth every 6 (six) hours as needed for moderate pain (pain score 4-6). 30 tablet 0   traZODone  (DESYREL ) 100 MG tablet Take 1 tablet (100 mg total) by mouth at bedtime as needed for sleep.      Labs  Lab Results:  Admission on 09/07/2024  Component Date Value Ref Range Status    WBC 09/07/2024 11.3 (H)  4.0 - 10.5 K/uL Final   RBC 09/07/2024 3.98  3.87 - 5.11 MIL/uL Final   Hemoglobin 09/07/2024 10.3 (L)  12.0 - 15.0 g/dL Final   HCT 87/91/7974 30.4 (L)  36.0 - 46.0 % Final   MCV 09/07/2024 76.4 (L)  80.0 - 100.0 fL Final   MCH 09/07/2024 25.9 (L)  26.0 - 34.0 pg Final   MCHC 09/07/2024 33.9  30.0 - 36.0 g/dL Final   RDW 87/91/7974 21.9 (H)  11.5 - 15.5 % Final   Platelets 09/07/2024 354  150 - 400 K/uL Final   nRBC 09/07/2024 0.0  0.0 - 0.2 % Final   Neutrophils Relative % 09/07/2024 61  % Final   Neutro Abs 09/07/2024 6.8  1.7 - 7.7 K/uL Final   Lymphocytes Relative 09/07/2024 34  % Final   Lymphs Abs 09/07/2024 3.8  0.7 - 4.0 K/uL Final   Monocytes Relative 09/07/2024 5  % Final   Monocytes Absolute 09/07/2024 0.6  0.1 - 1.0 K/uL Final   Eosinophils Relative 09/07/2024 0  % Final   Eosinophils Absolute 09/07/2024 0.1  0.0 - 0.5 K/uL Final   Basophils Relative 09/07/2024 0  % Final   Basophils Absolute 09/07/2024 0.0  0.0 - 0.1 K/uL Final   WBC Morphology 09/07/2024 MORPHOLOGY UNREMARKABLE   Final   RBC Morphology 09/07/2024 MORPHOLOGY UNREMARKABLE   Final   Smear Review 09/07/2024 Normal platelet morphology   Final   Immature Granulocytes 09/07/2024 0  % Final   Abs Immature Granulocytes 09/07/2024 0.02  0.00 - 0.07 K/uL Final   Performed at [facility], [address], [place], [place] [address]   Sodium 09/07/2024 137  135 - 145 mmol/L Final   Potassium 09/07/2024 4.0  3.5 - 5.1 mmol/L Final   Chloride 09/07/2024 101  98 - 111 mmol/L Final   CO2 09/07/2024 31  22 - 32 mmol/L Final   Glucose, Bld 09/07/2024 89  70 - 99 mg/dL Final   Glucose reference range applies only to samples taken after fasting for at least 8 hours.   BUN 09/07/2024  11  6 - 20 mg/dL Final   Creatinine, Ser 09/07/2024 0.73  0.44 - 1.00 mg/dL Final   Calcium  09/07/2024 8.8 (L)  8.9 - 10.3 mg/dL Final   Total Protein 87/91/7974 7.9  6.5 - 8.1 g/dL Final   Albumin 87/91/7974  3.9  3.5 - 5.0 g/dL Final   AST 87/91/7974 17  15 - 41 U/L Final   ALT 09/07/2024 12  0 - 44 U/L Final   Alkaline Phosphatase 09/07/2024 62  38 - 126 U/L Final   Total Bilirubin 09/07/2024 1.0  0.0 - 1.2 mg/dL Final   GFR, Estimated 09/07/2024 >60  >60 mL/min Final   Comment: (NOTE) Calculated using the CKD-EPI Creatinine Equation (2021)    Anion gap 09/07/2024 5  5 - 15 Final   Performed at [facility], [address] [place], [place] [address]   Alcohol, Ethyl (B) 09/07/2024 <15  <15 mg/dL Final   Comment: (NOTE) For medical purposes only. Performed at [facility], [address], [place], [place] [address]    TSH 09/07/2024 1.082  0.350 - 4.500 uIU/mL Final   Comment: Performed by a 3rd Generation assay with a functional sensitivity of <=0.01 uIU/mL. Performed at [facility], [address], [place], [place] [address]    Preg Test, Ur 09/07/2024 Negative  Negative Final   POC Amphetamine UR 09/07/2024 None Detected  NONE DETECTED (Cut Off Level 1000 ng/mL) Final   POC Secobarbital (BAR) 09/07/2024 None Detected  NONE DETECTED (Cut Off Level 300 ng/mL) Final   POC Buprenorphine (BUP) 09/07/2024 None Detected  NONE DETECTED (Cut Off Level 10 ng/mL) Final   POC Oxazepam (BZO) 09/07/2024 None Detected  NONE DETECTED (Cut Off Level 300 ng/mL) Final   POC Cocaine UR 09/07/2024 None Detected  NONE DETECTED (Cut Off Level 300 ng/mL) Final   POC Methamphetamine UR 09/07/2024 None Detected  NONE DETECTED (Cut Off Level 1000 ng/mL) Final   POC Morphine  09/07/2024 None Detected  NONE DETECTED (Cut Off Level 300 ng/mL) Final   POC Methadone UR 09/07/2024 None Detected  NONE DETECTED (Cut Off Level 300 ng/mL) Final   POC Oxycodone  UR 09/07/2024 None Detected  NONE DETECTED (Cut Off Level 100 ng/mL) Final   POC Marijuana UR 09/07/2024 None Detected  NONE DETECTED (Cut Off Level 50 ng/mL) Final  Admission on 09/02/2024, Discharged on 09/03/2024  Component Date Value Ref  Range Status   POC Amphetamine UR 09/02/2024 None Detected  NONE DETECTED (Cut Off Level 1000 ng/mL) Final   POC Secobarbital (BAR) 09/02/2024 None Detected  NONE DETECTED (Cut Off Level 300 ng/mL) Final   POC Buprenorphine (BUP) 09/02/2024 None Detected  NONE DETECTED (Cut Off Level 10 ng/mL) Final   POC Oxazepam (BZO) 09/02/2024 None Detected  NONE DETECTED (Cut Off Level 300 ng/mL) Final   POC Cocaine UR 09/02/2024 None Detected  NONE DETECTED (Cut Off Level 300 ng/mL) Final   POC Methamphetamine UR 09/02/2024 None Detected  NONE DETECTED (Cut Off Level 1000 ng/mL) Final   POC Morphine  09/02/2024 None Detected  NONE DETECTED (Cut Off Level 300 ng/mL) Final   POC Methadone UR 09/02/2024 None Detected  NONE DETECTED (Cut Off Level 300 ng/mL) Final   POC Oxycodone  UR 09/02/2024 None Detected  NONE DETECTED (Cut Off Level 100 ng/mL) Final   POC Marijuana UR 09/02/2024 None Detected  NONE DETECTED (Cut Off Level 50 ng/mL) Final   Preg Test, Ur 09/02/2024 Negative  Negative Final  Admission on 09/02/2024,  Discharged on 09/02/2024  Component Date Value Ref Range Status   SARS Coronavirus 2 by RT PCR 09/02/2024 NEGATIVE  NEGATIVE Final   Influenza A by PCR 09/02/2024 NEGATIVE  NEGATIVE Final   Influenza B by PCR 09/02/2024 NEGATIVE  NEGATIVE Final   Comment: (NOTE) The Xpert Xpress SARS-CoV-2/FLU/RSV plus assay is intended as an aid in the diagnosis of influenza from Nasopharyngeal swab specimens and should not be used as a sole basis for treatment. Nasal washings and aspirates are unacceptable for Xpert Xpress SARS-CoV-2/FLU/RSV testing.  Fact Sheet for Patients: bloggercourse.com  Fact Sheet for Healthcare Providers: seriousbroker.it  This test is not yet approved or cleared by the [place]  FDA and has been authorized for detection and/or diagnosis of SARS-CoV-2 by FDA under an Emergency Use Authorization (EUA). This EUA will  remain in effect (meaning this test can be used) for the duration of the COVID-19 declaration under Section 564(b)(1) of the Act, 21 U.S.C. section 360bbb-3(b)(1), unless the authorization is terminated or revoked.     Resp Syncytial Virus by PCR 09/02/2024 NEGATIVE  NEGATIVE Final   Comment: (NOTE) Fact Sheet for Patients: bloggercourse.com  Fact Sheet for Healthcare Providers: seriousbroker.it  This test is not yet approved or cleared by the [place]  FDA and has been authorized for detection and/or diagnosis of SARS-CoV-2 by FDA under an Emergency Use Authorization (EUA). This EUA will remain in effect (meaning this test can be used) for the duration of the COVID-19 declaration under Section 564(b)(1) of the Act, 21 U.S.C. section 360bbb-3(b)(1), unless the authorization is terminated or revoked.  Performed at [facility], [address], [place], [place] [address]   Admission on 08/12/2024, Discharged on 08/13/2024  Component Date Value Ref Range Status   RPR Ser Ql 08/12/2024 NON REACTIVE  NON REACTIVE Final   Performed at [facility], [address], [place], [place] [address]   Neisseria Gonorrhea 08/12/2024 Negative   Final   Chlamydia 08/12/2024 Negative   Final   Comment 08/12/2024 Normal Reference Ranger Chlamydia - Negative   Final   Comment 08/12/2024 Normal Reference Range Neisseria Gonorrhea - Negative   Final   TSH 08/12/2024 0.595  0.350 - 4.500 uIU/mL Final   Comment: Performed by a 3rd Generation assay with a functional sensitivity of <=0.01 uIU/mL. Performed at [facility], [address], [place], [place] [address]    Cholesterol 08/12/2024 126  0 - 200 mg/dL Final   Triglycerides 88/87/7974 93  <150 mg/dL Final   HDL 88/87/7974 35 (L)  >40 mg/dL Final   Total CHOL/HDL Ratio 08/12/2024 3.6  RATIO Final   VLDL 08/12/2024 19  0 - 40 mg/dL Final   LDL Cholesterol 08/12/2024 72  0 -  99 mg/dL Final   Comment:        Total Cholesterol/HDL:CHD Risk Coronary Heart Disease Risk Table                     Men   Women  1/2 Average Risk   3.4   3.3  Average Risk       5.0   4.4  2 X Average Risk   9.6   7.1  3 X Average Risk  23.4   11.0        Use the calculated Patient Ratio above and the CHD Risk Table to determine the patient's CHD Risk.        ATP III CLASSIFICATION (LDL):  <100  mg/dL   Optimal  899-870  mg/dL   Near or Above                    Optimal  130-159  mg/dL   Borderline  839-810  mg/dL   High  >809     mg/dL   Very High Performed at [facility], [address] [place], [place] [address]    Magnesium  08/12/2024 2.0  1.7 - 2.4 mg/dL Final   Performed at [facility], [address] [place], [place] [address]   Hgb A1c MFr Bld 08/12/2024 6.0 (H)  4.8 - 5.6 % Final   Comment: (NOTE) Diagnosis of Diabetes The following HbA1c ranges recommended by the American Diabetes Association (ADA) may be used as an aid in the diagnosis of diabetes mellitus.  Hemoglobin             Suggested A1C NGSP%              Diagnosis  <5.7                   Non Diabetic  5.7-6.4                Pre-Diabetic  >6.4                   Diabetic  <7.0                   Glycemic control for                       adults with diabetes.     Mean Plasma Glucose 08/12/2024 125.5  mg/dL Final   Performed at [facility], [address] [place], [place] [address]   Sodium 08/12/2024 138  135 - 145 mmol/L Final   Potassium 08/12/2024 3.6  3.5 - 5.1 mmol/L Final   Chloride 08/12/2024 102  98 - 111 mmol/L Final   CO2 08/12/2024 24  22 - 32 mmol/L Final   Glucose, Bld 08/12/2024 108 (H)  70 - 99 mg/dL Final   Glucose reference range applies only to samples taken after fasting for at least 8 hours.   BUN 08/12/2024 14  6 - 20 mg/dL Final   Creatinine, Ser 08/12/2024 0.67  0.44 - 1.00 mg/dL Final   Calcium  08/12/2024 9.4  8.9 - 10.3 mg/dL Final   Total  Protein 08/12/2024 8.9 (H)  6.5 - 8.1 g/dL Final   Albumin 88/87/7974 3.9  3.5 - 5.0 g/dL Final   AST 88/87/7974 16  15 - 41 U/L Final   ALT 08/12/2024 15  0 - 44 U/L Final   Alkaline Phosphatase 08/12/2024 63  38 - 126 U/L Final   Total Bilirubin 08/12/2024 0.6  0.0 - 1.2 mg/dL Final   GFR, Estimated 08/12/2024 >60  >60 mL/min Final   Comment: (NOTE) Calculated using the CKD-EPI Creatinine Equation (2021)    Anion gap 08/12/2024 12  5 - 15 Final   Performed at [facility], [address] [place], [place] [address]   WBC 08/12/2024 12.2 (H)  4.0 - 10.5 K/uL Final   RBC 08/12/2024 5.25 (H)  3.87 - 5.11 MIL/uL Final   Hemoglobin 08/12/2024 10.6 (L)  12.0 - 15.0 g/dL Final   HCT 88/87/7974 35.8 (L)  36.0 - 46.0 % Final   MCV 08/12/2024 68.2 (L)  80.0 - 100.0 fL Final   MCH  08/12/2024 20.2 (L)  26.0 - 34.0 pg Final   MCHC 08/12/2024 29.6 (L)  30.0 - 36.0 g/dL Final   RDW 88/87/7974 20.2 (H)  11.5 - 15.5 % Final   Platelets 08/12/2024 405 (H)  150 - 400 K/uL Final   nRBC 08/12/2024 0.0  0.0 - 0.2 % Final   Neutrophils Relative % 08/12/2024 53  % Final   Neutro Abs 08/12/2024 6.3  1.7 - 7.7 K/uL Final   Lymphocytes Relative 08/12/2024 42  % Final   Lymphs Abs 08/12/2024 5.2 (H)  0.7 - 4.0 K/uL Final   Monocytes Relative 08/12/2024 4  % Final   Monocytes Absolute 08/12/2024 0.5  0.1 - 1.0 K/uL Final   Eosinophils Relative 08/12/2024 1  % Final   Eosinophils Absolute 08/12/2024 0.1  0.0 - 0.5 K/uL Final   Basophils Relative 08/12/2024 0  % Final   Basophils Absolute 08/12/2024 0.0  0.0 - 0.1 K/uL Final   WBC Morphology 08/12/2024 MORPHOLOGY UNREMARKABLE   Final   Smear Review 08/12/2024 Normal platelet morphology   Final   Immature Granulocytes 08/12/2024 0  % Final   Abs Immature Granulocytes 08/12/2024 0.03  0.00 - 0.07 K/uL Final   Polychromasia 08/12/2024 PRESENT   Final   Performed at [facility], [address] [place], [place] [address]   Hepatitis B Surface Ag  08/12/2024 NON REACTIVE  NON REACTIVE Final   HCV Ab 08/12/2024 NON REACTIVE  NON REACTIVE Final   Comment: (NOTE) Nonreactive HCV antibody screen is consistent with no HCV infections,  unless recent infection is suspected or other evidence exists to indicate HCV infection.     Hep A IgM 08/12/2024 NON REACTIVE  NON REACTIVE Final   Hep B C IgM 08/12/2024 NON REACTIVE  NON REACTIVE Final   Performed at [facility], [address], [place], [place] [address]   Preg Test, Ur 08/12/2024 Negative  Negative Final   POC Amphetamine UR 08/12/2024 None Detected  NONE DETECTED (Cut Off Level 1000 ng/mL) Final   POC Secobarbital (BAR) 08/12/2024 None Detected  NONE DETECTED (Cut Off Level 300 ng/mL) Final   POC Buprenorphine (BUP) 08/12/2024 None Detected  NONE DETECTED (Cut Off Level 10 ng/mL) Final   POC Oxazepam (BZO) 08/12/2024 None Detected  NONE DETECTED (Cut Off Level 300 ng/mL) Final   POC Cocaine UR 08/12/2024 None Detected  NONE DETECTED (Cut Off Level 300 ng/mL) Final   POC Methamphetamine UR 08/12/2024 None Detected  NONE DETECTED (Cut Off Level 1000 ng/mL) Final   POC Morphine  08/12/2024 None Detected  NONE DETECTED (Cut Off Level 300 ng/mL) Final   POC Methadone UR 08/12/2024 None Detected  NONE DETECTED (Cut Off Level 300 ng/mL) Final   POC Oxycodone  UR 08/12/2024 None Detected  NONE DETECTED (Cut Off Level 100 ng/mL) Final   POC Marijuana UR 08/12/2024 None Detected  NONE DETECTED (Cut Off Level 50 ng/mL) Final  Admission on 07/28/2024, Discharged on 07/31/2024  Component Date Value Ref Range Status   Cholesterol 07/29/2024 116  0 - 200 mg/dL Final   Comment:        ATP III CLASSIFICATION:  <200     mg/dL   Desirable  799-760  mg/dL   Borderline High  >=759    mg/dL   High           Triglycerides 07/29/2024 56  <150 mg/dL Final   HDL 89/70/7974 33 (L)  >40 mg/dL Final   Total CHOL/HDL Ratio  07/29/2024 3.5  RATIO Final   VLDL 07/29/2024 11  0 - 40 mg/dL Final   LDL  Cholesterol 07/29/2024 71  0 - 99 mg/dL Final   Comment:        Total Cholesterol/HDL:CHD Risk Coronary Heart Disease Risk Table                     Men   Women  1/2 Average Risk   3.4   3.3  Average Risk       5.0   4.4  2 X Average Risk   9.6   7.1  3 X Average Risk  23.4   11.0        Use the calculated Patient Ratio above and the CHD Risk Table to determine the patient's CHD Risk.        ATP III CLASSIFICATION (LDL):  <100     mg/dL   Optimal  899-870  mg/dL   Near or Above                    Optimal  130-159  mg/dL   Borderline  839-810  mg/dL   High  >809     mg/dL   Very High Performed at [facility], [address], [place], [place] [address]    Vit D, 25-Hydroxy 07/30/2024 11.48 (L)  30 - 100 ng/mL Final   Comment: (NOTE) Vitamin D  deficiency has been defined by the Institute of Medicine  and an Endocrine Society practice guideline as a level of serum 25-OH  vitamin D  less than 20 ng/mL (1,2). The Endocrine Society went on to  further define vitamin D  insufficiency as a level between 21 and 29  ng/mL (2).  1. IOM (Institute of Medicine). 2010. Dietary reference intakes for  calcium  and D. [place]: [employer]. 2. Holick MF, Binkley Rathdrum, Bischoff-Ferrari HA, et al. Evaluation,  treatment, and prevention of vitamin D  deficiency: an Endocrine  Society clinical practice guideline, JCEM. 2011 Jul; 96(7): 1911-30.  Performed at [facility], [address], [place], [place] [address]   Admission on 07/27/2024, Discharged on 07/28/2024  Component Date Value Ref Range Status   WBC 07/28/2024 10.4  4.0 - 10.5 K/uL Final   RBC 07/28/2024 4.98  3.87 - 5.11 MIL/uL Final   Hemoglobin 07/28/2024 10.1 (L)  12.0 - 15.0 g/dL Final   HCT 89/71/7974 34.4 (L)  36.0 - 46.0 % Final   MCV 07/28/2024 69.1 (L)  80.0 - 100.0 fL Final   MCH 07/28/2024 20.3 (L)  26.0 - 34.0 pg Final   MCHC 07/28/2024 29.4 (L)  30.0 - 36.0 g/dL Final   RDW  89/71/7974 19.6 (H)  11.5 - 15.5 % Final   Platelets 07/28/2024 389  150 - 400 K/uL Final   REPEATED TO VERIFY   nRBC 07/28/2024 0.0  0.0 - 0.2 % Final   Neutrophils Relative % 07/28/2024 50  % Final   Neutro Abs 07/28/2024 5.2  1.7 - 7.7 K/uL Final   Lymphocytes Relative 07/28/2024 42  % Final   Lymphs Abs 07/28/2024 4.3 (H)  0.7 - 4.0 K/uL Final   Monocytes Relative 07/28/2024 6  % Final   Monocytes Absolute 07/28/2024 0.6  0.1 - 1.0 K/uL Final   Eosinophils Relative 07/28/2024 1  % Final   Eosinophils Absolute 07/28/2024 0.1  0.0 - 0.5 K/uL Final   Basophils Relative 07/28/2024 1  % Final   Basophils  Absolute 07/28/2024 0.1  0.0 - 0.1 K/uL Final   WBC Morphology 07/28/2024 MORPHOLOGY UNREMARKABLE   Final   RBC Morphology 07/28/2024 MORPHOLOGY UNREMARKABLE   Final   Smear Review 07/28/2024 Normal platelet morphology   Final   Immature Granulocytes 07/28/2024 0  % Final   Abs Immature Granulocytes 07/28/2024 0.04  0.00 - 0.07 K/uL Final   Performed at [facility], [address], [place], [place] [address]   Sodium 07/28/2024 137  135 - 145 mmol/L Final   Potassium 07/28/2024 4.2  3.5 - 5.1 mmol/L Final   Chloride 07/28/2024 101  98 - 111 mmol/L Final   CO2 07/28/2024 25  22 - 32 mmol/L Final   Glucose, Bld 07/28/2024 95  70 - 99 mg/dL Final   Glucose reference range applies only to samples taken after fasting for at least 8 hours.   BUN 07/28/2024 13  6 - 20 mg/dL Final   Creatinine, Ser 07/28/2024 0.75  0.44 - 1.00 mg/dL Final   Calcium  07/28/2024 9.4  8.9 - 10.3 mg/dL Final   Total Protein 89/71/7974 8.0  6.5 - 8.1 g/dL Final   Albumin 89/71/7974 3.9  3.5 - 5.0 g/dL Final   AST 89/71/7974 15  15 - 41 U/L Final   ALT 07/28/2024 11  0 - 44 U/L Final   Alkaline Phosphatase 07/28/2024 65  38 - 126 U/L Final   Total Bilirubin 07/28/2024 0.7  0.0 - 1.2 mg/dL Final   GFR, Estimated 07/28/2024 >60  >60 mL/min Final   Comment: (NOTE) Calculated using the CKD-EPI Creatinine  Equation (2021)    Anion gap 07/28/2024 11  5 - 15 Final   Performed at [facility], [address] [place], [place] [address]   Hgb A1c MFr Bld 07/28/2024 5.9 (H)  4.8 - 5.6 % Final   Comment: (NOTE) Diagnosis of Diabetes The following HbA1c ranges recommended by the American Diabetes Association (ADA) may be used as an aid in the diagnosis of diabetes mellitus.  Hemoglobin             Suggested A1C NGSP%              Diagnosis  <5.7                   Non Diabetic  5.7-6.4                Pre-Diabetic  >6.4                   Diabetic  <7.0                   Glycemic control for                       adults with diabetes.     Mean Plasma Glucose 07/28/2024 122.63  mg/dL Final   Performed at [facility], [address] [place], [place] [address]   Preg Test, Ur 07/27/2024 Negative  Negative Final   POC Amphetamine UR 07/27/2024 None Detected  NONE DETECTED (Cut Off Level 1000 ng/mL) Final   POC Secobarbital (BAR) 07/27/2024 None Detected  NONE DETECTED (Cut Off Level 300 ng/mL) Final   POC Buprenorphine (BUP) 07/27/2024 None Detected  NONE DETECTED (Cut Off Level 10 ng/mL) Final   POC Oxazepam (BZO) 07/27/2024 None Detected  NONE DETECTED (Cut Off Level 300 ng/mL) Final   POC Cocaine UR 07/27/2024 None Detected  NONE DETECTED (Cut Off Level 300 ng/mL) Final   POC Methamphetamine UR 07/27/2024 None Detected  NONE DETECTED (Cut Off Level 1000 ng/mL) Final   POC Morphine  07/27/2024 None Detected  NONE DETECTED (Cut Off Level 300 ng/mL) Final   POC Methadone UR 07/27/2024 None Detected  NONE DETECTED (Cut Off Level 300 ng/mL) Final   POC Oxycodone  UR 07/27/2024 None Detected  NONE DETECTED (Cut Off Level 100 ng/mL) Final   POC Marijuana UR 07/27/2024 None Detected  NONE DETECTED (Cut Off Level 50 ng/mL) Final  Admission on 06/22/2024, Discharged on 06/23/2024  Component Date Value Ref Range Status   WBC 06/22/2024 10.4  4.0 - 10.5 K/uL Final   RBC 06/22/2024 4.93   3.87 - 5.11 MIL/uL Final   Hemoglobin 06/22/2024 10.1 (L)  12.0 - 15.0 g/dL Final   HCT 90/77/7974 34.2 (L)  36.0 - 46.0 % Final   MCV 06/22/2024 69.4 (L)  80.0 - 100.0 fL Final   MCH 06/22/2024 20.5 (L)  26.0 - 34.0 pg Final   MCHC 06/22/2024 29.5 (L)  30.0 - 36.0 g/dL Final   RDW 90/77/7974 19.3 (H)  11.5 - 15.5 % Final   Platelets 06/22/2024 377  150 - 400 K/uL Final   REPEATED TO VERIFY   nRBC 06/22/2024 0.0  0.0 - 0.2 % Final   Neutrophils Relative % 06/22/2024 52  % Final   Neutro Abs 06/22/2024 5.4  1.7 - 7.7 K/uL Final   Lymphocytes Relative 06/22/2024 40  % Final   Lymphs Abs 06/22/2024 4.2 (H)  0.7 - 4.0 K/uL Final   Monocytes Relative 06/22/2024 6  % Final   Monocytes Absolute 06/22/2024 0.6  0.1 - 1.0 K/uL Final   Eosinophils Relative 06/22/2024 1  % Final   Eosinophils Absolute 06/22/2024 0.1  0.0 - 0.5 K/uL Final   Basophils Relative 06/22/2024 1  % Final   Basophils Absolute 06/22/2024 0.1  0.0 - 0.1 K/uL Final   Immature Granulocytes 06/22/2024 0  % Final   Abs Immature Granulocytes 06/22/2024 0.02  0.00 - 0.07 K/uL Final   Performed at [facility], [address] [place], [place] [address]   Sodium 06/22/2024 135  135 - 145 mmol/L Final   Potassium 06/22/2024 3.2 (L)  3.5 - 5.1 mmol/L Final   Chloride 06/22/2024 100  98 - 111 mmol/L Final   CO2 06/22/2024 23  22 - 32 mmol/L Final   Glucose, Bld 06/22/2024 148 (H)  70 - 99 mg/dL Final   Glucose reference range applies only to samples taken after fasting for at least 8 hours.   BUN 06/22/2024 13  6 - 20 mg/dL Final   Creatinine, Ser 06/22/2024 0.74  0.44 - 1.00 mg/dL Final   Calcium  06/22/2024 9.1  8.9 - 10.3 mg/dL Final   Total Protein 90/77/7974 7.9  6.5 - 8.1 g/dL Final   Albumin 90/77/7974 3.5  3.5 - 5.0 g/dL Final   AST 90/77/7974 16  15 - 41 U/L Final   ALT 06/22/2024 9  0 - 44 U/L Final   Alkaline Phosphatase 06/22/2024 61  38 - 126 U/L Final   Total Bilirubin 06/22/2024 0.8  0.0 - 1.2 mg/dL Final    GFR, Estimated 06/22/2024 >60  >60 mL/min Final   Comment: (NOTE) Calculated using the CKD-EPI Creatinine Equation (2021)    Anion gap 06/22/2024 12  5 - 15 Final   Performed at [facility], [address], [place], [place] [address]  Color, Urine 06/22/2024 YELLOW  YELLOW Final   APPearance 06/22/2024 HAZY (A)  CLEAR Final   Specific Gravity, Urine 06/22/2024 1.019  1.005 - 1.030 Final   pH 06/22/2024 5.0  5.0 - 8.0 Final   Glucose, UA 06/22/2024 NEGATIVE  NEGATIVE mg/dL Final   Hgb urine dipstick 06/22/2024 LARGE (A)  NEGATIVE Final   Bilirubin Urine 06/22/2024 NEGATIVE  NEGATIVE Final   Ketones, ur 06/22/2024 NEGATIVE  NEGATIVE mg/dL Final   Protein, ur 90/77/7974 30 (A)  NEGATIVE mg/dL Final   Nitrite 90/77/7974 NEGATIVE  NEGATIVE Final   Leukocytes,Ua 06/22/2024 SMALL (A)  NEGATIVE Final   RBC / HPF 06/22/2024 >50  0 - 5 RBC/hpf Final   WBC, UA 06/22/2024 6-10  0 - 5 WBC/hpf Final   Bacteria, UA 06/22/2024 FEW (A)  NONE SEEN Final   Squamous Epithelial / HPF 06/22/2024 0-5  0 - 5 /HPF Final   Mucus 06/22/2024 PRESENT   Final   Performed at [facility], [address], [place], [place] [address]   hCG, Beta Chain, Quant, S 06/22/2024 <1  <5 mIU/mL Final   Comment:          GEST. AGE      CONC.  (mIU/mL)   <=1 WEEK        5 - 50     2 WEEKS       50 - 500     3 WEEKS       100 - 10,000     4 WEEKS     1,000 - 30,000     5 WEEKS     3,500 - 115,000   6-8 WEEKS     12,000 - 270,000    12 WEEKS     15,000 - 220,000        FEMALE AND NON-PREGNANT FEMALE:     LESS THAN 5 mIU/mL Performed at [facility], [address], [place], [place] [address]    Lipase 06/22/2024 20  11 - 51 U/L Final   Performed at [facility], [address], [place], [place] [address]   Specimen Description 06/23/2024 URINE, CLEAN CATCH   Final   Special Requests 06/23/2024    Final                   Value:NONE Performed at [facility], [address] [place], [place] [address]    Culture 06/23/2024 MULTIPLE SPECIES PRESENT, SUGGEST RECOLLECTION (A)   Final   Report Status 06/23/2024 06/23/2024 FINAL   Final   Yeast Wet Prep HPF POC 06/23/2024 NONE SEEN  NONE SEEN Final   Trich, Wet Prep 06/23/2024 NONE SEEN  NONE SEEN Final   Clue Cells Wet Prep HPF POC 06/23/2024 NONE SEEN  NONE SEEN Final   WBC, Wet Prep HPF POC 06/23/2024 <10  <10 Final   Sperm 06/23/2024 NONE SEEN   Final   Performed at [facility], [address], [place], [place] [address]   Neisseria Gonorrhea 06/23/2024 Negative   Final   Chlamydia 06/23/2024 Negative   Final   Comment 06/23/2024 Normal Reference Ranger Chlamydia - Negative   Final   Comment 06/23/2024 Normal Reference Range Neisseria Gonorrhea - Negative   Final  Admission on 05/31/2024, Discharged on 05/31/2024  Component Date Value Ref Range Status   Specimen Source 05/31/2024 URINE, CLEAN CATCH   Final   Color, Urine 05/31/2024 YELLOW  YELLOW Final  APPearance 05/31/2024 CLEAR  CLEAR Final   Specific Gravity, Urine 05/31/2024 1.017  1.005 - 1.030 Final   pH 05/31/2024 5.0  5.0 - 8.0 Final   Glucose, UA 05/31/2024 NEGATIVE  NEGATIVE mg/dL Final   Hgb urine dipstick 05/31/2024 NEGATIVE  NEGATIVE Final   Bilirubin Urine 05/31/2024 NEGATIVE  NEGATIVE Final   Ketones, ur 05/31/2024 NEGATIVE  NEGATIVE mg/dL Final   Protein, ur 91/68/7974 NEGATIVE  NEGATIVE mg/dL Final   Nitrite 91/68/7974 NEGATIVE  NEGATIVE Final   Leukocytes,Ua 05/31/2024 NEGATIVE  NEGATIVE Final   RBC / HPF 05/31/2024 0-5  0 - 5 RBC/hpf Final   WBC, UA 05/31/2024 0-5  0 - 5 WBC/hpf Final   Comment:        Reflex urine culture not performed if WBC <=10, OR if Squamous epithelial cells >5. If Squamous epithelial cells >5 suggest recollection.    Bacteria, UA 05/31/2024 RARE (A)  NONE SEEN Final   Squamous Epithelial / HPF 05/31/2024 0-5  0 - 5 /HPF Final   Mucus 05/31/2024 PRESENT   Final   Performed at [facility],  [address], [place], [place] [address]  Admission on 05/07/2024, Discharged on 05/07/2024  Component Date Value Ref Range Status   Color, Urine 05/07/2024 YELLOW  YELLOW Final   APPearance 05/07/2024 CLEAR  CLEAR Final   Specific Gravity, Urine 05/07/2024 1.014  1.005 - 1.030 Final   pH 05/07/2024 7.0  5.0 - 8.0 Final   Glucose, UA 05/07/2024 NEGATIVE  NEGATIVE mg/dL Final   Hgb urine dipstick 05/07/2024 NEGATIVE  NEGATIVE Final   Bilirubin Urine 05/07/2024 NEGATIVE  NEGATIVE Final   Ketones, ur 05/07/2024 NEGATIVE  NEGATIVE mg/dL Final   Protein, ur 91/92/7974 NEGATIVE  NEGATIVE mg/dL Final   Nitrite 91/92/7974 NEGATIVE  NEGATIVE Final   Leukocytes,Ua 05/07/2024 NEGATIVE  NEGATIVE Final   Performed at [facility], [address], [place], [place] [address]  Admission on 05/02/2024, Discharged on 05/02/2024  Component Date Value Ref Range Status   WBC 05/02/2024 9.4  4.0 - 10.5 K/uL Final   RBC 05/02/2024 4.77  3.87 - 5.11 MIL/uL Final   Hemoglobin 05/02/2024 9.7 (L)  12.0 - 15.0 g/dL Final   HCT 91/97/7974 33.2 (L)  36.0 - 46.0 % Final   MCV 05/02/2024 69.6 (L)  80.0 - 100.0 fL Final   MCH 05/02/2024 20.3 (L)  26.0 - 34.0 pg Final   MCHC 05/02/2024 29.2 (L)  30.0 - 36.0 g/dL Final   RDW 91/97/7974 19.3 (H)  11.5 - 15.5 % Final   Platelets 05/02/2024 350  150 - 400 K/uL Final   nRBC 05/02/2024 0.0  0.0 - 0.2 % Final   Neutrophils Relative % 05/02/2024 62  % Final   Neutro Abs 05/02/2024 5.8  1.7 - 7.7 K/uL Final   Lymphocytes Relative 05/02/2024 32  % Final   Lymphs Abs 05/02/2024 3.0  0.7 - 4.0 K/uL Final   Monocytes Relative 05/02/2024 2  % Final   Monocytes Absolute 05/02/2024 0.2  0.1 - 1.0 K/uL Final   Eosinophils Relative 05/02/2024 2  % Final   Eosinophils Absolute 05/02/2024 0.2  0.0 - 0.5 K/uL Final   Basophils Relative 05/02/2024 2  % Final   Basophils Absolute 05/02/2024 0.2 (H)  0.0 - 0.1 K/uL Final   WBC Morphology 05/02/2024 See Note   Final    Morphology  unremarkable   RBC Morphology 05/02/2024 MORPHOLOGY UNREMARKABLE   Final    Morphology unremarkable  Smear Review 05/02/2024 See Note   Final   Comment:  Normal Platelet Morphology Performed at [facility], [address] [place], [place] [address]    Sodium 05/02/2024 137  135 - 145 mmol/L Final   Potassium 05/02/2024 3.6  3.5 - 5.1 mmol/L Final   Chloride 05/02/2024 105  98 - 111 mmol/L Final   CO2 05/02/2024 22  22 - 32 mmol/L Final   Glucose, Bld 05/02/2024 144 (H)  70 - 99 mg/dL Final   Glucose reference range applies only to samples taken after fasting for at least 8 hours.   BUN 05/02/2024 9  6 - 20 mg/dL Final   Creatinine, Ser 05/02/2024 0.74  0.44 - 1.00 mg/dL Final   Calcium  05/02/2024 9.1  8.9 - 10.3 mg/dL Final   Total Protein 91/97/7974 7.7  6.5 - 8.1 g/dL Final   Albumin 91/97/7974 3.4 (L)  3.5 - 5.0 g/dL Final   AST 91/97/7974 13 (L)  15 - 41 U/L Final   ALT 05/02/2024 9  0 - 44 U/L Final   Alkaline Phosphatase 05/02/2024 57  38 - 126 U/L Final   Total Bilirubin 05/02/2024 0.7  0.0 - 1.2 mg/dL Final   GFR, Estimated 05/02/2024 >60  >60 mL/min Final   Comment: (NOTE) Calculated using the CKD-EPI Creatinine Equation (2021)    Anion gap 05/02/2024 10  5 - 15 Final   Performed at [facility], [address], [place], [place] [address]   Lipase 05/02/2024 24  11 - 51 U/L Final   Performed at [facility], [address] [place], [place] [address]  There may be more visits with results that are not included.    Blood Alcohol level:  Lab Results  Component Value Date   [facility] <15 09/07/2024   ETH <15 03/09/2024    Metabolic Disorder Labs: Lab Results  Component Value Date   HGBA1C 6.0 (H) 08/12/2024   MPG 125.5 08/12/2024   MPG 122.63 07/28/2024   No results found for: PROLACTIN Lab Results  Component Value Date   CHOL 126 08/12/2024   TRIG 93 08/12/2024   HDL 35 (L) 08/12/2024   CHOLHDL 3.6 08/12/2024   VLDL 19 08/12/2024    LDLCALC 72 08/12/2024   LDLCALC 71 07/29/2024    Therapeutic Lab Levels: No results found for: LITHIUM No results found for: VALPROATE No results found for: CBMZ  Physical Findings   AUDIT    Flowsheet Row [facility] Ad[facility] ([facility]) [facility] Admission (Discharged) [facility]  Alcohol Use Disorder Identification Test Final Score (AUDIT) 0 0 0   GAD-7    Flowsheet Row Office Visit from 02/17/2020 in [facility] Telemedicine from 01/18/2020 in [facility] Postpartum Visit from 05/14/2017 in [facility]  Total GAD-7 Score 15 14 0   PHQ2-9    Flowsheet Row Office Visit from 02/17/2020 in [facility] 1 Postpartum Visit from 05/14/2017 in [facility]  PHQ-2 Total Score 6 0  PHQ-9 Total Score 23 3   Flowsheet Row ED from 09/07/2024 in [facility] ED from 09/02/2024 in [facility] (Discharged[facility]  C-SSRS RISK CATEGORY Moderate Risk High Risk High Risk  Musculoskeletal  Strength & Muscle Tone: within normal limits Gait & Station: normal Patient leans: N/A  Psychiatric Specialty Exam  Presentation  General Appearance:  Appropriate for Environment  Eye Contact: Fair  Speech: Clear and Coherent  Speech Volume: Normal  Handedness: Right   Mood and Affect  Mood: Anxious; Depressed  Affect: Congruent   Thought Process  Thought Processes: Coherent  Descriptions of Associations:Intact  Orientation:Full (Time, Place and Person)  Thought Content:WDL  Diagnosis of Schizophrenia or Schizoaffective disorder in past: No  Duration of Psychotic  Symptoms: Less than six months   Hallucinations:Hallucinations: None  Ideas of Reference:None  Suicidal Thoughts:Suicidal Thoughts: Yes, Active SI Passive Intent and/or Plan: With Intent; With Plan  Homicidal Thoughts:Homicidal Thoughts: No   Sensorium  Memory: Immediate Fair  Judgment: Fair  Insight: Fair   Chartered Certified Accountant: Fair  Attention Span: Fair  Recall: [employer] of Knowledge: Fair  Language: Fair   Psychomotor Activity  Psychomotor Activity: Psychomotor Activity: Normal   Assets  Assets: Desire for Improvement; Resilience; Vocational/Educational   Sleep  Sleep: Sleep: Fair  No Safety Checks orders active in given range  Nutritional Assessment (For OBS and [facility] admissions only) Has the patient had a weight loss or gain of 10 pounds or more in the last 3 months?: No Has the patient had a decrease in food intake/or appetite?: No Does the patient have dental problems?: No Does the patient have eating habits or behaviors that may be indicators of an eating disorder including binging or inducing vomiting?: No Has the patient recently lost weight without trying?: 0 Has the patient been eating poorly because of a decreased appetite?: 0 Malnutrition Screening Tool Score: 0    Physical Exam  Physical Exam Vitals and nursing note reviewed.  Constitutional:      General: She is not in acute distress.    Appearance: She is well-developed.  HENT:     Head: Normocephalic and atraumatic.  Eyes:     Conjunctiva/sclera: Conjunctivae normal.  Cardiovascular:     Rate and Rhythm: Normal rate.  Pulmonary:     Effort: Pulmonary effort is normal.  Musculoskeletal:        General: No swelling.     Cervical back: Neck supple.  Skin:    General: Skin is warm and dry.     Capillary Refill: Capillary refill takes less than 2 seconds.  Neurological:     Mental Status: She is alert.  Psychiatric:        Attention and Perception:  Attention and perception normal.        Mood and Affect: Mood is depressed. Affect is blunt.        Speech: Speech is tangential.        Behavior: Behavior is uncooperative.        Thought Content: Thought content normal.        Cognition and Memory: Memory normal. Cognition is impaired.        Judgment: Judgment is impulsive.    Review of Systems  Constitutional:  Negative for chills, diaphoresis, fever, malaise/fatigue and weight loss.  Respiratory:  Negative for cough.   Cardiovascular:  Negative for chest pain.  Gastrointestinal:  Negative for abdominal pain, constipation, diarrhea, nausea and vomiting.  Genitourinary:  Negative for urgency.  Psychiatric/Behavioral:  Positive for depression. Negative for hallucinations, substance abuse and suicidal ideas. The patient is nervous/anxious and has insomnia.    Blood pressure (!) 103/59, pulse 85, temperature 98.6 F (37 C),  temperature source Oral, resp. rate 18, SpO2 99%. There is no height or weight on file to calculate BMI.  Treatment Plan Summary: Daily contact with patient to assess and evaluate symptoms and progress in treatment and Medication management  We will keep the patient here tonight with the hope of getting her connected with better resources in the outpatient setting and continue counseling about a long acting injectable. We would like to avoid sending her to [facility] or another hospital if possible.  Lynwood Morene Lavone Delsie, MD 09/08/2024 11:52 AM

## 2024-09-08 NOTE — ED Notes (Signed)
Patient sleeping with no s/s of distress.

## 2024-09-08 NOTE — ED Notes (Signed)
 Pt. Received awake, alert and oriented, resistant to nurse patient interview initially.  Then began to answer questions selectively.  Denies SI or AVH. Pt is slow to answer when she does, and is guarded.  Will approach unhurriedly.  Will provide safety

## 2024-09-09 DIAGNOSIS — F3162 Bipolar disorder, current episode mixed, moderate: Secondary | ICD-10-CM

## 2024-09-09 DIAGNOSIS — Z638 Other specified problems related to primary support group: Secondary | ICD-10-CM

## 2024-09-09 DIAGNOSIS — Z5900 Homelessness unspecified: Secondary | ICD-10-CM

## 2024-09-09 MED ORDER — NIFEDIPINE ER 30 MG PO TB24: 30.0000 mg | ORAL_TABLET | Freq: Every day | ORAL | 0 refills | Status: DC

## 2024-09-09 MED ORDER — ARIPIPRAZOLE 15 MG PO TABS: 15.0000 mg | ORAL_TABLET | Freq: Every day | ORAL | Status: DC

## 2024-09-09 MED ORDER — ARIPIPRAZOLE 15 MG PO TABS: 15.0000 mg | ORAL_TABLET | Freq: Every day | ORAL | 0 refills | Status: DC

## 2024-09-09 NOTE — ED Provider Notes (Signed)
 FBC/OBS ASAP Discharge Summary  Date and Time: 09/09/2024 9:33 AM  Name: Melinda Jimenez  MRN:  [id number]   Discharge Diagnoses:  Final diagnoses:  Family discord  Recurrent major depressive disorder, remission status unspecified  Homeless    Subjective: Melinda Jimenez is a 38 year old African-American female who was recently discharged from [facility] on 07/30/2024. Present with past psychiatric history significant for major depressive disorder, recurrent, episode severe generalized anxiety, disorder bipolar 1 disorder severe, borderline personality disorder. PMHx include hypertension, prediabetes, and vitamin D  deficiency.     She came to the [facility] she came back to the behavioral health urgent care last week for assistance in the setting of the recent murder of her brother.  Since that time she has continued to have minimal compliance with medication regimen, numerous conflicts with her family around the grief that they are all experiencing, also had a failure to appear the court hearing, which resulted in her being put in jail overnight recently.  12/10: This morning on assessment the patient is seen on the observation unit.  She is calm but irritable.  She reports that several staff members make her upset and she feels that they have negative energy.  Asked if she meant this in a magic energy sort of way or a  feels that they are not kind sort of a way.  She endorsed the latter.  Discussed the efforts that social work had gone to in order to set her up with case management programs.  She will be working with the peer support specialist Jessica phone number [phone].  They will be assisting her with navigating resources for housing and care follow-up.  Patient was once again reluctant to engage in conversation about risks and benefits of long-acting injectable formulations for her primary antipsychotic use as a mood stabilizer for her bipolar disorder.  She remains pre-contemplative with  regards to starting more consistent therapy. She is, however, not appearing as pressured as yesterday and is able to demonstrate good self-control.  Stay Summary: Patient was admitted overnight on 12/8, reassessed on 12/9, Outpatient social work resources were gathered on 12/9, patient was deemed stable for discharge on 12/10.  Total Time spent with patient: 20 minutes  Past Psychiatric History: Previous Psych Diagnoses: MDD, bipolar disorder, and generalized anxiety. Prior inpatient treatment: X 3, most recently at [facility] in November 2025 Current/prior outpatient treatment: Denies Prior rehab hx: Denies Psychotherapy hx: Yes History of suicide: Denies History of homicide or aggression: Denies Psychiatric medication history: Yes, Abilify  trazodone  and hydroxyzine  Psychiatric medication compliance history: Noncompliance Neuromodulation history: denies Current Psychiatrist: Denies Current therapist: Denies  Past Medical History:  Diagnosis Date   Anemia    Anxiety    off meds with preg   Bipolar 1 disorder (HCC) 03/10/2024   Borderline personality disorder (HCC) 03/10/2024   Brief psychotic disorder (HCC) 04/03/2020   Carpal tunnel syndrome    Cesarean delivery delivered 10/10/2020   Chlamydia    Chronic hypertension    Chronic post-traumatic stress disorder (PTSD) 03/10/2024   COVID-19 09/12/2020   Genital HSV    Last OB: > 47yr ago; Valtrex  500 mg po prn outbreaks   GERD (gastroesophageal reflux disease)    GERD without esophagitis 01/20/2013   Gestational diabetes    diet controlled   Goiter, unspecified 04/18/2010   Centricity Description: THYROMEGALY  Qualifier: Diagnosis of   By: Lelon RIGGERS, Scott      Centricity Description: GOITER  Qualifier: Diagnosis of   By: Lelon  PA-C, Scott      Replacing diagnoses that were inactivated after the 12/31/22 regulatory import     H/O: cesarean section 09/27/2016   last C-section for active HSV, C-section X1: 05/20/13; records in epic LT[facility]   TOLAC desired, signed 09/27/16      Headache(784.0)    Herpes genitalis in women 04/18/2010   Qualifier: Diagnosis of   By: Lelon RIGGERS, Scott         IBS (irritable bowel syndrome)    Infection    UTI   Intrauterine pregnancy 03/22/2020   EDC 11/14/2020     Panic attack    Premature rupture of membranes (PROM), onset of labor after 24 hours, antepartum, preterm, third trimester 10/10/2020   Pulmonary embolus (HCC) 10/18/2020   Thyroid  disease    Trichomonas    Vitamin D  deficiency 03/10/2024    Family History:  Family History  Problem Relation Age of Onset   Depression Mother    Anxiety disorder Mother    Breast cancer Other    Bipolar disorder Brother    Diabetes Maternal Grandmother    Cancer Maternal Grandmother    Hypertension Maternal Grandmother    Hypertension Paternal Grandmother    Autism Other    Schizophrenia Other    Social History: Patient is currently homeless, has had numerous family upheavals over the past couple of years that she becomes tearful when she discusses. Patient lost her brother to murder approximately 8 days ago (12/1).    Tobacco Cessation:  N/A, patient does not currently use tobacco products  Current Medications:  Current Facility-Administered Medications  Medication Dose Route Frequency Provider Last Rate Last Admin   acetaminophen  (TYLENOL ) tablet 650 mg  650 mg Oral Q6H PRN Trudy Carwin, NP       alum & mag hydroxide-simeth (MAALOX/MYLANTA) 200-200-20 MG/5ML suspension 30 mL  30 mL Oral Q4H PRN Trudy Carwin, NP       ARIPiprazole  (ABILIFY ) tablet 15 mg  15 mg Oral Daily Delsie Lynwood Morene Lavone, MD       haloperidol  (HALDOL ) tablet 5 mg  5 mg Oral TID PRN Trudy Carwin, NP       And   diphenhydrAMINE  (BENADRYL ) capsule 50 mg  50 mg Oral TID PRN Trudy Carwin, NP       haloperidol  lactate (HALDOL ) injection 5 mg  5 mg Intramuscular TID PRN Trudy Carwin, NP       And   diphenhydrAMINE  (BENADRYL ) injection 50 mg  50 mg  Intramuscular TID PRN Trudy Carwin, NP       And   LORazepam  (ATIVAN ) injection 2 mg  2 mg Intramuscular TID PRN Trudy Carwin, NP       haloperidol  lactate (HALDOL ) injection 10 mg  10 mg Intramuscular TID PRN Trudy Carwin, NP       And   diphenhydrAMINE  (BENADRYL ) injection 50 mg  50 mg Intramuscular TID PRN Trudy Carwin, NP       And   LORazepam  (ATIVAN ) injection 2 mg  2 mg Intramuscular TID PRN Trudy Carwin, NP       magnesium  hydroxide (MILK OF MAGNESIA) suspension 30 mL  30 mL Oral Daily PRN Trudy Carwin, NP       Current Outpatient Medications  Medication Sig Dispense Refill   albuterol  (VENTOLIN  HFA) 108 (90 Base) MCG/ACT inhaler Inhale 2 puffs into the lungs every 4 (four) hours as needed for wheezing or shortness of breath.     amoxicillin  (AMOXIL ) 500 MG capsule Take 1  capsule (500 mg total) by mouth 3 (three) times daily. (Patient taking differently: Take 500 mg by mouth 3 (three) times daily. Take for 10 days starting on 09/02/24)     ARIPiprazole  (ABILIFY ) 15 MG tablet Take 1 tablet (15 mg total) by mouth daily.     NIFEdipine  (ADALAT  CC) 30 MG 24 hr tablet Take 1 tablet (30 mg total) by mouth daily. 7 tablet 0   Vitamin D , Ergocalciferol , (DRISDOL ) 1.25 MG (50000 UNIT) CAPS capsule Take 1 capsule (50,000 Units total) by mouth every 7 (seven) days.     hydrOXYzine  (ATARAX ) 25 MG tablet Take 1 tablet (25 mg total) by mouth every 8 (eight) hours as needed for anxiety.     ibuprofen  (ADVIL ) 600 MG tablet Take 1 tablet (600 mg total) by mouth every 6 (six) hours as needed for moderate pain (pain score 4-6). 30 tablet 0   traZODone  (DESYREL ) 100 MG tablet Take 1 tablet (100 mg total) by mouth at bedtime as needed for sleep.      PTA Medications:  Facility Ordered Medications  Medication   acetaminophen  (TYLENOL ) tablet 650 mg   alum & mag hydroxide-simeth (MAALOX/MYLANTA) 200-200-20 MG/5ML suspension 30 mL   magnesium  hydroxide (MILK OF MAGNESIA) suspension 30 mL    haloperidol  (HALDOL ) tablet 5 mg   And   diphenhydrAMINE  (BENADRYL ) capsule 50 mg   haloperidol  lactate (HALDOL ) injection 5 mg   And   diphenhydrAMINE  (BENADRYL ) injection 50 mg   And   LORazepam  (ATIVAN ) injection 2 mg   haloperidol  lactate (HALDOL ) injection 10 mg   And   diphenhydrAMINE  (BENADRYL ) injection 50 mg   And   LORazepam  (ATIVAN ) injection 2 mg   ARIPiprazole  (ABILIFY ) tablet 15 mg   PTA Medications  Medication Sig   ARIPiprazole  (ABILIFY ) 15 MG tablet Take 1 tablet (15 mg total) by mouth daily.   amoxicillin  (AMOXIL ) 500 MG capsule Take 1 capsule (500 mg total) by mouth 3 (three) times daily. (Patient taking differently: Take 500 mg by mouth 3 (three) times daily. Take for 10 days starting on 09/02/24)   albuterol  (VENTOLIN  HFA) 108 (90 Base) MCG/ACT inhaler Inhale 2 puffs into the lungs every 4 (four) hours as needed for wheezing or shortness of breath.   Vitamin D , Ergocalciferol , (DRISDOL ) 1.25 MG (50000 UNIT) CAPS capsule Take 1 capsule (50,000 Units total) by mouth every 7 (seven) days.   NIFEdipine  (ADALAT  CC) 30 MG 24 hr tablet Take 1 tablet (30 mg total) by mouth daily.   ibuprofen  (ADVIL ) 600 MG tablet Take 1 tablet (600 mg total) by mouth every 6 (six) hours as needed for moderate pain (pain score 4-6).   hydrOXYzine  (ATARAX ) 25 MG tablet Take 1 tablet (25 mg total) by mouth every 8 (eight) hours as needed for anxiety.   traZODone  (DESYREL ) 100 MG tablet Take 1 tablet (100 mg total) by mouth at bedtime as needed for sleep.       02/17/2020   10:08 AM 05/14/2017    1:05 PM  Depression screen PHQ 2/9  Decreased Interest 3 0  Down, Depressed, Hopeless 3 0  PHQ - 2 Score 6 0  Altered sleeping 3 0  Tired, decreased energy 3 3  Change in appetite 1 0  Feeling bad or failure about yourself  3 0  Trouble concentrating 3 0  Moving slowly or fidgety/restless 2 0  Suicidal thoughts 2 0  PHQ-9 Score 23  3      Data saved with a previous flowsheet  row definition     Flowsheet Row ED from 09/07/2024 in [facility] ED from 09/02/2024 in [facility] Ad[facility] (Discharged[facility]  C-SSRS RISK CATEGORY Error: Question 6 not populated High Risk High Risk    Musculoskeletal  Strength & Muscle Tone: within normal limits Gait & Station: normal Patient leans: N/A  Psychiatric Specialty Exam  Mental Status Exam:  Appearance: Patient is a casually dressed African-American female sitting upright in bed.  She is wearing hospital scrubs, has numerous tattoos she is clean and her hair is brushed  Behavior: Good eye contact, not fidgeting  Attitude: Guarded with respect to the other staff, cooperative with interviewer  Speech: Regular rate rhythm unpressured  Mood:  I am okay, irritated with that woman though  Affect: Congruent, irritable  Thought Process: Linear, coherent  Thought Content: Denies paranoia, denies delusions  SI/HI: Denies active or passive SI, denies HI  Perceptions: Denies hallucinations, not seen responding  Judgment: Shallow  Insight: Shallow  Fund of Knowledge: WNL     No data recorded  Physical Exam  Physical Exam Vitals and nursing note reviewed.  Constitutional:      Appearance: She is obese.  HENT:     Head: Normocephalic and atraumatic.     Nose: Nose normal.  Pulmonary:     Effort: Pulmonary effort is normal.  Skin:    General: Skin is warm and dry.  Neurological:     Mental Status: She is alert and oriented to person, place, and time.  Psychiatric:        Behavior: Behavior normal.        Thought Content: Thought content normal.    Review of Systems  Constitutional:  Negative for chills, diaphoresis, fever, malaise/fatigue and weight loss.  Respiratory:  Negative for cough.   Cardiovascular:  Negative for chest pain.  Gastrointestinal:  Negative for abdominal pain, constipation, diarrhea, nausea  and vomiting.  Genitourinary:  Negative for urgency.  Psychiatric/Behavioral:  Positive for depression. Negative for hallucinations, substance abuse and suicidal ideas. The patient is not nervous/anxious and does not have insomnia.    Blood pressure 129/88, pulse 81, temperature 98.4 F (36.9 C), temperature source Oral, resp. rate 18, SpO2 97%. There is no height or weight on file to calculate BMI.  Suicide Risk Assessment:  Suicidal ideation/thoughts:   []  Current         [x]  Recent         [x]  Denies Current       []  Remote   []  Chronic  Intention to act or plan:        []  Current     []  Recent [x]  Denies  []  Remote  Preparatory behavior:  []  Recent    [x]  Denies Recent      []  Remote Instance  Suicide attempts:  []  Immediately prior to this admission     []  During this admission    []  Recent    []  Multiple   [x]  Remote    []  Denies Ever     Risk Factors  Protective Factors  Acute  Recent loss (job, relationship, family member), Sense of purposelessness, Recent impulsivity or acting recklessly, Feelings of humiliation, shame, or guilt, Feelings of rejection or abandonment, Legal problems, and Recent Discharge from inpatient psych unit (within last two weeks) AcuteSuicideProtectiveFactors: No access to highly lethal means, Denies current SI or Intent, No recent suicide attempts, Not in acute  distress, No recent severe insomnia, No command AH encouraging suicide, No recent exposure to suicide, No severe pain, and None  Chronic Previous suicide attempt, Major psychiatric disorder, Cluster B personality disorder/traits, Emotional lability, History of abuse/trauma, Pattern of impulsivity/recklessness, Chronic unemployment, Chronic homelessness, Lack of social support, and Barriers to accessing healthcare No previous self-harm behaviors, No history of violence, Good physical health, Willingness to seek help, and Achievable life goals/ambitions   Potential future factors that may impact  risk: FutureSuicideFactors : Criminal proceedings and Expected change in living situation  Summary: While it is impossible to accurately predict with absolute certainty future events and human behaviors, an assessment of current suicidal indicators, risk factors, and protective factors suggests that this patient's:   Acute suicide risk is: mild in degree.    Chronic suicide risk is: moderate in degree.   Suicide risk increases with substance/alcohol use and acute intoxication.   Plan Of Care/Follow-up recommendations:  Patient discharging to [facility] with Peer Support Specialists engaged in her care. She should follow-up with   Disposition: to the [facility].  Lynwood Morene Lavone Delsie, MD 09/09/2024, 9:33 AM

## 2024-09-09 NOTE — Discharge Instructions (Addendum)
 The Pilot Program  Peer Support Specialist is Harlene [phone]  They will assist Juliahna with resources for housing, food and follow up mental health care.     The intake process has been completed so that the Peer Support Specialist is able to work with the patient once she is discharged.     ____ Outpatient Therapy and Psychiatry Resources for Patients: Your psychiatric needs would be well-served by consultation and regular meetings with an outpatient therapist to assist you with your mood-related conditions. Here are a series of links for finding a therapist.    Includes links to the following: [facility] (http://wilson-mayo.com/) (only for [facility] and please reserve for uninsured) [facility] (http://blankenship-martinez.net/) Psychology Today Special Educational Needs Teacher (https://www.psychologytoday.com/us lendell) Psychology Today Support Group Tax Inspector (https://www.psychologytoday.com/us /groups/) [employer] - [employer] Location (https://carolinabehavioralcare.com/staff-location/[place]/) [facility] - Support Group Finder - (recorddebt.fi) [facility] the [employer] - [employer] (https://fspcares.org/contact/) [employer] for Mental Health [place] - NAMI (https://namiguilford.org/support-and-education/support-groups/) [employer] [facility] - Affiliated with [facility] (https://www.[place].com/lb/locations/profile/[facility]-[place]-behavioral-medicine-at-[address]/) Dept of Health and Human Services - Find a mental health facility (http://lester.info/) _____ Closing notes from your doctor: It was a pleasure taking care of you while you were with us . I want to stress once more that  medications are part of, but not ALL of what we must do to take care of ourselves mentally.   We all need: - Physical activity - at least 3, but preferably 5 days per week where we exercise for 30 minutes at a level where we get out of breath. That will vary by person and health conditions, but it will improve your mental health as well as your physical health. - Good restful sleep - avoid using a phone or screens in bed, sleep in a dark room, use a white noise machine or app and if you are having trouble sleeping, consult your doctor. - To reduce use of substances - alcohol and tobacco are shown to be harmful to many parts of our health, if you need help, ask for it. The same is true for illegal drugs, if you need help, ask for it. - To find purpose - whether it is caring for loved ones, volunteering, pursuing hobbies, or doing work that means something to you, try and live in accordance with your values. - Healthy relationships - seek healthy relationships where the other person helps you grow and challenges you to be your best. If it does not feel good, seek assistance.  JINNY Morene GORMAN Delsie, MD [facility] Psychiatry Resident

## 2024-09-09 NOTE — Care Management (Signed)
 OBS Managed Care..  Patient can discharge today 09/09/24 to [facility]  Writer coordinated meeting with Peer Support Specialist  Patient is connected to Harlene and [employer] (Peer Support) [phone].   They will follow up with patient and continue working with patient providing community support resources (ie housing, food, MH care and assist with DSS)   Patient has referral, consents and documentation submitted to [employer] Program   RN to arrange transportation

## 2024-09-12 ENCOUNTER — Ambulatory Visit (HOSPITAL_COMMUNITY)
Admission: EM | Admit: 2024-09-12 | Discharge: 2024-09-12 | Disposition: A | Discharge status: Home / Self Care | Payer: MEDICAID | Attending: Nurse Practitioner | Admitting: Nurse Practitioner

## 2024-09-12 DIAGNOSIS — F331 Major depressive disorder, recurrent, moderate: Secondary | ICD-10-CM
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
## 2024-09-12 NOTE — Discharge Instructions (Addendum)
°  Discharge recommendations:  Patient is to take medications as prescribed. Please see information for follow-up appointment with psychiatry and therapy. Please follow up with your primary care provider for all medical related needs.  Follow up with [facility] with Peer Support Specialists-:  [address]  [place], [place] [address] [phone]  Follow up with-[facility] [address]  [place], [place], [place] [address] Tel [phone] Hours of operation: Mon- Thurs: 8am - 8pm?Fri: 8am - 5pm Walk-in hours: Mon-Fri: 8:30am - 12pm?1pm - 2:30pm  Therapy: We recommend that patient participate in individual therapy to address mental health concerns.  Medications: The patient or guardian is to contact a medical professional and/or outpatient provider to address any new side effects that develop. The patient or guardian should update outpatient providers of any new medications and/or medication changes.   Atypical antipsychotics: If you are prescribed an atypical antipsychotic, it is recommended that your height, weight, BMI, blood pressure, fasting lipid panel, and fasting blood sugar be monitored by your outpatient providers.  Safety:  The patient should abstain from use of illicit substances/drugs and abuse of any medications. If symptoms worsen or do not continue to improve or if the patient becomes actively suicidal or homicidal then it is recommended that the patient return to the closest hospital emergency department, the [facility], or call 911 for further evaluation and treatment. National Suicide Prevention Lifeline 1-800-SUICIDE or 705 472 4244.  About 988 988 offers 24/7 access to trained crisis counselors who can help people experiencing mental health-related distress. People can call or text 988 or chat 988lifeline.org for themselves or if they are worried about a loved one who may need crisis support.  Crisis Mobile: Therapeutic  Alternatives:                     (936)309-6452 (for crisis response 24 hours a day) [facility] Hotline:                                            360-021-5679

## 2024-09-12 NOTE — ED Provider Notes (Signed)
 [facility] Medical Screening Exam  Patient Name: Melinda Jimenez MRN: [id number] Date of Evaluation: 09/13/2024 Chief Complaint:   Diagnosis:  Final diagnoses:  MDD (major depressive disorder), recurrent episode, moderate (HCC)    History of Present illness: Melinda Jimenez is a 38 y.o. single female with a psychiatric history of MDD, bipolar disorder, borderline personality disorder, GAD presenting voluntarily to [facility] unaccompanied via [employer]. Patient reports that she felt uncomfortable around the people at the hotel where she was staying. Patient reports that she does not like to be around a lot of people because it makes he feel unsafe. Patient reports that her brother passed away recently and she is still grieving her brother.   Melinda Jimenez is being seen face to face by this provider and chart reviewed on 09/13/2024.  On evaluation [name] reports that she does feel paranoid around people at times and that makes her feel unsafe. Patient reports that she is taking medications that were prescribed at her last hospitalization at [facility] from 09/07/24 to 09/09/24 for MDD. Patient reports that she is compliant with her medications although she does not know the name of her medications. Patient reports that she does not have a therapist or psychiatrist for medication management.   During evaluation Melinda Jimenez is sitting in the assessment room in no acute distress.  She is alert, oriented x 4, calm, cooperative and attentive.  Her mood is euthymic with congruent affect.  She has normal speech, and behavior.  Objectively there is no evidence of psychosis/mania or delusional thinking.  Patient is able to converse coherently, goal directed thoughts, no distractibility, or pre-occupation. She also denies suicidal/self-harm/homicidal ideation, psychosis, and paranoia.  Patient answered question appropriately.    Patient can be discharged with community resources and follow up  care in outpatient services for Medication Management and Individual Therapy.   Flowsheet Row ED from 09/12/2024 in [facility] ED from 09/07/2024 in [facility] ED from 09/02/2024 in [facility]  C-SSRS RISK CATEGORY Error: Q3, 4, or 5 should not be populated when Q2 is No Error: Question 6 not populated High Risk    Psychiatric Specialty Exam  Presentation  General Appearance:Casual  Eye Contact:Fair  Speech:Clear and Coherent  Speech Volume:Normal  Handedness:Right   Mood and Affect  Mood: Euthymic  Affect: Congruent   Thought Process  Thought Processes: Coherent  Descriptions of Associations:Intact  Orientation:Full (Time, Place and Person)  Thought Content:WDL  Diagnosis of Schizophrenia or Schizoaffective disorder in past: No  Duration of Psychotic Symptoms: Less than six months  Hallucinations:None  Ideas of Reference:None  Suicidal Thoughts:No Without Intent; Without Plan Without Intent; Without Plan  Homicidal Thoughts:No Without Intent; Without Means to [employer]  Memory: Immediate Fair; Recent Fair; Remote Fair  Judgment: Fair  Insight: Fair   Chartered Certified Accountant: Fair  Attention Span: Fair  Recall: [employer] of Knowledge: Fair  Language: Fair   Psychomotor Activity  Psychomotor Activity: Normal   Assets  Assets: Manufacturing Systems Engineer; Physical Health; Resilience   Sleep  Sleep: Fair  Number of hours:  6   Physical Exam: Physical Exam HENT:     Head: Normocephalic.     Nose: Nose normal.  Eyes:     Pupils: Pupils are equal, round, and reactive to light.  Cardiovascular:     Rate and Rhythm: Normal rate.  Musculoskeletal:  General: Normal range of motion.  Skin:    General: Skin is warm.  Neurological:     Mental Status: She is alert and oriented to person, place, and time.   Psychiatric:        Attention and Perception: Attention normal.        Mood and Affect: Mood is anxious.        Speech: Speech normal.        Behavior: Behavior is cooperative.        Thought Content: Thought content normal.        Cognition and Memory: Cognition normal.        Judgment: Judgment normal.    Review of Systems  Constitutional: Negative.   HENT: Negative.    Respiratory: Negative.    Cardiovascular: Negative.   Genitourinary: Negative.   Skin: Negative.   Neurological: Negative.   Psychiatric/Behavioral:  The patient is nervous/anxious.    Blood pressure 130/85, pulse 92, temperature 98.3 F (36.8 C), temperature source Oral, resp. rate 16, SpO2 98%. There is no height or weight on file to calculate BMI.  Musculoskeletal: Strength & Muscle Tone: within normal limits Gait & Station: normal Patient leans: N/A   [facility] MSE Discharge Disposition for Follow up and Recommendations: Based on my evaluation the patient does not appear to have an emergency medical condition and can be discharged with resources and follow up care in outpatient services for Medication Management and Individual Therapy   Sadat Sliwa E Shamonique Battiste, NP 09/13/2024, 7:06 AM

## 2024-09-13 ENCOUNTER — Ambulatory Visit (HOSPITAL_COMMUNITY)
Admission: EM | Admit: 2024-09-13 | Discharge: 2024-09-15 | Disposition: A | Payer: MEDICAID | Attending: Psychiatry | Admitting: Psychiatry

## 2024-09-13 DIAGNOSIS — R45851 Suicidal ideations: Secondary | ICD-10-CM | POA: Diagnosis not present

## 2024-09-13 DIAGNOSIS — Z5901 Sheltered homelessness: Secondary | ICD-10-CM | POA: Diagnosis not present

## 2024-09-13 DIAGNOSIS — F3162 Bipolar disorder, current episode mixed, moderate: Secondary | ICD-10-CM | POA: Diagnosis not present

## 2024-09-13 DIAGNOSIS — Z79899 Other long term (current) drug therapy: Secondary | ICD-10-CM | POA: Insufficient documentation

## 2024-09-13 DIAGNOSIS — Z560 Unemployment, unspecified: Secondary | ICD-10-CM | POA: Insufficient documentation

## 2024-09-13 LAB — POCT URINE DRUG SCREEN - MANUAL ENTRY (I-SCREEN)
POC Amphetamine UR: NOT DETECTED
POC Buprenorphine (BUP): NOT DETECTED
POC Cocaine UR: NOT DETECTED
POC Marijuana UR: NOT DETECTED
POC Methadone UR: NOT DETECTED
POC Methamphetamine UR: NOT DETECTED
POC Morphine: NOT DETECTED
POC Oxazepam (BZO): NOT DETECTED
POC Oxycodone UR: NOT DETECTED
POC Secobarbital (BAR): NOT DETECTED

## 2024-09-13 LAB — URINALYSIS, ROUTINE W REFLEX MICROSCOPIC
Bilirubin Urine: NEGATIVE
Glucose, UA: NEGATIVE mg/dL
Hgb urine dipstick: NEGATIVE
Ketones, ur: NEGATIVE mg/dL
Leukocytes,Ua: NEGATIVE
Nitrite: NEGATIVE
Protein, ur: NEGATIVE mg/dL
Specific Gravity, Urine: 1.018 (ref 1.005–1.030)
pH: 6 (ref 5.0–8.0)

## 2024-09-13 LAB — POC URINE PREG, ED: Preg Test, Ur: NEGATIVE

## 2024-09-13 MED ORDER — DIPHENHYDRAMINE HCL 50 MG PO CAPS: 50.0000 mg | ORAL_CAPSULE | Freq: Three times a day (TID) | ORAL | Status: DC | PRN

## 2024-09-13 MED ORDER — HALOPERIDOL 5 MG PO TABS: 5.0000 mg | ORAL_TABLET | Freq: Three times a day (TID) | ORAL | Status: DC | PRN

## 2024-09-13 MED ORDER — HALOPERIDOL LACTATE 5 MG/ML IJ SOLN: 5.0000 mg | Freq: Three times a day (TID) | INTRAMUSCULAR | Status: DC | PRN

## 2024-09-13 MED ORDER — ACETAMINOPHEN 325 MG PO TABS: 650.0000 mg | ORAL_TABLET | Freq: Four times a day (QID) | ORAL | Status: DC | PRN

## 2024-09-13 MED ORDER — ARIPIPRAZOLE 15 MG PO TABS
15.0000 mg | ORAL_TABLET | Freq: Every day | ORAL | Status: DC
  Administered 2024-09-14: 09:00:00 15 mg via ORAL
  Filled 2024-09-13 (×2): qty 1

## 2024-09-13 MED ORDER — MAGNESIUM HYDROXIDE 400 MG/5ML PO SUSP: 30.0000 mL | Freq: Every day | ORAL | Status: DC | PRN

## 2024-09-13 MED ORDER — TRAZODONE HCL 50 MG PO TABS: 50.0000 mg | ORAL_TABLET | Freq: Every evening | ORAL | Status: DC | PRN

## 2024-09-13 MED ORDER — HYDROXYZINE HCL 25 MG PO TABS: 25.0000 mg | ORAL_TABLET | Freq: Three times a day (TID) | ORAL | Status: DC | PRN

## 2024-09-13 MED ORDER — ALUM & MAG HYDROXIDE-SIMETH 200-200-20 MG/5ML PO SUSP: 30.0000 mL | ORAL | Status: DC | PRN

## 2024-09-13 MED ORDER — NIFEDIPINE ER OSMOTIC RELEASE 30 MG PO TB24
30.0000 mg | ORAL_TABLET | Freq: Every day | ORAL | Status: DC
  Administered 2024-09-14: 09:00:00 30 mg via ORAL
  Filled 2024-09-13 (×2): qty 1

## 2024-09-13 MED ORDER — LORAZEPAM 2 MG/ML IJ SOLN: 2.0000 mg | Freq: Three times a day (TID) | INTRAMUSCULAR | Status: DC | PRN

## 2024-09-13 MED ORDER — DIPHENHYDRAMINE HCL 50 MG/ML IJ SOLN: 50.0000 mg | Freq: Three times a day (TID) | INTRAMUSCULAR | Status: DC | PRN

## 2024-09-13 MED ORDER — ALBUTEROL SULFATE HFA 108 (90 BASE) MCG/ACT IN AERS: 2.0000 | INHALATION_SPRAY | RESPIRATORY_TRACT | Status: DC | PRN

## 2024-09-13 MED ORDER — HALOPERIDOL LACTATE 5 MG/ML IJ SOLN: 10.0000 mg | Freq: Three times a day (TID) | INTRAMUSCULAR | Status: DC | PRN

## 2024-09-13 NOTE — ED Notes (Signed)
 Patient admitted to observation unit for overnight observation with planned discharge in the morning d/t SI and HI, both of which patient is now denying on the unit. Calm, cooperative throughout interview process. Skin assessment completed. Oriented to unit. Meal and drink offered.  Patient alert & oriented x4. Denies intent to harm self or others when asked. Denies A/VH. Patient denies any physical complaints when asked. No acute distress noted. Support and encouragement provided. Routine safety checks conducted per facility protocol. Encouraged patient to notify staff if any thoughts of harm towards self or others arise. Patient verbalizes understanding and agreement.

## 2024-09-13 NOTE — ED Notes (Signed)
 Pt is sleeping at the moment. No acute distress noted. Q15 safety checks in place.

## 2024-09-13 NOTE — ED Provider Notes (Signed)
 [facility] Continuous Assessment Admission H&P  Date: 09/13/2024 Patient Name: Melinda Jimenez MRN: [id number] Chief Complaint: SI, worsening irritability & HI.  Diagnoses:  Final diagnoses:  None   HPI: Melinda Jimenez is a 38 y.o. female with a documented history of bipolar disorder presenting to the [facility] today with complaints of worsening depressive symptoms, as well as suicidal ideations, and reporting that she came close to jumping off a bridge last night.  Patient also presenting with homicidal ideations towards random people who often high in the community.  Assessment and review of psychiatry symptoms: On assessment, mood is depressed, dysphoric, patient repeatedly states I don't feel like I have to place in the world no more.  She reports feelings of worthlessness, helplessness, repeatedly talks about how tired she is of trying to push through her life, but only in bed.  Repeatedly states that the reason why she has not completed suicide yet is because she wants to be able to see her children again; she talks about her children being in DSS custody for some time now, states that children who are in DSS custody 49, 45, and 36 years old.  She reports that she has an 36 year old with whom she talks, sometimes he does not pick up the phone also rendering her to question her existence.  Patient reports depressive symptoms including insomnia, anhedonia, talks about how she used to have a love for music, but does not love music anymore.  She reports feelings of guilt regarding not being able to see her children anymore, reports decreased energy levels, and even though she carries the diagnosis of bipolar disorder, she denies episodes where her energy level was ever more elevated than usual.  Patient reports that she stays mostly depressed, she talks about her concentration level being poor, reports appetite has been fluctuating between over eating, and barely  eating.  She reports mental clouding, inability to think clearly and also reports symptoms consistent with psychomotor retardation.  Patient talks about difficulty getting motivated to do things that will positively sustain her.  Patient reports that the symptoms have been ongoing for multiple years now, worsening in the past several weeks.  Patient reports worsening irritability and anger, persistently talking about wanting to hurt others in the community who often her; she states somebody is going to get it I do not know who it's gonna be.  Patient reports that the only reason why she has not physically harm somebody in the community yet is because she wants to be able to see her children again in the future.  Patient is guarded over the issue of her children being taken by DSS, will not disclose the reasons why.  She however talks about the last time that she was incarcerated, which she states was several months ago, and spent 4 days in jail, and states that it was for felony larceny.  Patient perseverates over the issue of people disrespecting her, she talks about being at the hotel last night, where they refused to renew her room, and and states in my head,I'm dead. I'm thinking at that time in the hotel, I'll kill somebody in this [place]. I'm trying to have them feel what I feel. They be in packs, but I am all alone.  Patient talks about not killing the person that offended her at the hotel last night because she wants to be able to see her children again.  Patient denies AVH, but presents with some lingering  delusions of persecution; she talks about people persistently bullying her, states that people have turned her into something that she really is not.  Talks about her frustration at this, and has anger at this.  Patient denies substance abuse of any type.  As per chart review, patient has had multiple presentations to the ER within the last month, and there are some concerns for malingering  behaviors for secondary gains of food and shelter, however, patient is also presenting with multiple risk factors to render her at a risk of danger to self and others at this time.  Patient is unable to tell writer why she did not follow-up with services after being discharged from [facility] earlier this month.  Patient reports that she takes her medications as ordered, specifically Abilify  p.o.  However, she denies having a current mental health provider in the community, denies having a therapist, requesting these services.  Suicide Risk Assessment  SUICIDE RISK: Chronic- Patient has a history of presenting to the ER with Frequent, intense, and enduring suicidal ideations, with specific plans but with no subjective intent, but some objective markers of intent (i.e., choice of lethal method), the method is accessible, some limited preparatory behavior. There evidence of some self-control, as patient is not able to tell writer a past suicide attempt.  She is however presenting with severe dysphoria/symptomatology, and possesses multiple risk factors such as a low socioeconomic status, and few if any protective factors, particularly a lack of social support.   Recommendations:  We will admit to the Observation unit overnight, with a plan to discharge tomorrow after connecting patient to her community support, and CSW could possibly assisting with locating  women's shelter such as the [facility] to where patient can go. Historically, patient has presented to this facility, and has been provided with lots of community resources including a peer support specialist, assisting them with locating shelter, and food resources.   Total Time spent with patient: 1.5 hours  Musculoskeletal  Strength & Muscle Tone: within normal limits Gait & Station: normal Patient leans: N/A  Psychiatric Specialty Exam  Presentation General Appearance:  Fairly Groomed  Eye Contact: Fair  Speech: Clear and  Coherent  Speech Volume: Normal  Handedness: Right   Mood and Affect  Mood: Depressed; Anxious  Affect: Congruent   Thought Process  Thought Processes: Coherent  Descriptions of Associations:Intact  Orientation:Full (Time, Place and Person)  Thought Content:WDL  Diagnosis of Schizophrenia or Schizoaffective disorder in past: No  Duration of Psychotic Symptoms: Less than six months  Hallucinations:Hallucinations: None  Ideas of Reference:Percusatory  Suicidal Thoughts:Suicidal Thoughts: Yes, Active SI Active Intent and/or Plan: With Plan SI Passive Intent and/or Plan: Without Intent; Without Plan  Homicidal Thoughts:Homicidal Thoughts: Yes, Active HI Active Intent and/or Plan: With Plan   Sensorium  Memory: Immediate Fair  Judgment: Fair  Insight: Fair   Chartered Certified Accountant: Fair  Attention Span: Fair  Recall: Dotti Abe of Knowledge: Fair  Language: Fair   Psychomotor Activity  Psychomotor Activity: Psychomotor Activity: Normal   Assets  Assets: Manufacturing Systems Engineer; Physical Health; Resilience   Sleep  Sleep: Sleep: Poor Number of Hours of Sleep: 6   Nutritional Assessment (For OBS and FBC admissions only) Has the patient had a weight loss or gain of 10 pounds or more in the last 3 months?: No Has the patient had a decrease in food intake/or appetite?: No Does the patient have dental problems?: No Does the patient have eating habits or behaviors  that may be indicators of an eating disorder including binging or inducing vomiting?: No Has the patient recently lost weight without trying?: 0 Has the patient been eating poorly because of a decreased appetite?: 0 Malnutrition Screening Tool Score: 0    Physical Exam HENT:     Head: Normocephalic.  Eyes:     Pupils: Pupils are equal, round, and reactive to light.  Neurological:     General: No focal deficit present.     Mental Status: She is alert and  oriented to person, place, and time.    ROS  Blood pressure (!) 131/97, pulse 85, temperature 98.3 F (36.8 C), temperature source Oral, resp. rate 18, SpO2 98%. There is no height or weight on file to calculate BMI.  Past Psychiatric History: bipolar d/o   Is the patient at risk to self? Yes  Has the patient been a risk to self in the past 6 months? Yes .    Has the patient been a risk to self within the distant past? No   Is the patient a risk to others? Yes   Has the patient been a risk to others in the past 6 months? No   Has the patient been a risk to others within the distant past? No   Past Medical History: htn as per patient   Family History: not provided  Social History: homeless, 4 kids, non in her custody. Not working.  Last Labs:  Admission on 09/07/2024, Discharged on 09/09/2024  Component Date Value Ref Range Status   WBC 09/07/2024 11.3 (H)  4.0 - 10.5 K/uL Final   RBC 09/07/2024 3.98  3.87 - 5.11 MIL/uL Final   Hemoglobin 09/07/2024 10.3 (L)  12.0 - 15.0 g/dL Final   HCT 87/91/7974 30.4 (L)  36.0 - 46.0 % Final   MCV 09/07/2024 76.4 (L)  80.0 - 100.0 fL Final   MCH 09/07/2024 25.9 (L)  26.0 - 34.0 pg Final   MCHC 09/07/2024 33.9  30.0 - 36.0 g/dL Final   RDW 87/91/7974 21.9 (H)  11.5 - 15.5 % Final   Platelets 09/07/2024 354  150 - 400 K/uL Final   nRBC 09/07/2024 0.0  0.0 - 0.2 % Final   Neutrophils Relative % 09/07/2024 61  % Final   Neutro Abs 09/07/2024 6.8  1.7 - 7.7 K/uL Final   Lymphocytes Relative 09/07/2024 34  % Final   Lymphs Abs 09/07/2024 3.8  0.7 - 4.0 K/uL Final   Monocytes Relative 09/07/2024 5  % Final   Monocytes Absolute 09/07/2024 0.6  0.1 - 1.0 K/uL Final   Eosinophils Relative 09/07/2024 0  % Final   Eosinophils Absolute 09/07/2024 0.1  0.0 - 0.5 K/uL Final   Basophils Relative 09/07/2024 0  % Final   Basophils Absolute 09/07/2024 0.0  0.0 - 0.1 K/uL Final   WBC Morphology 09/07/2024 MORPHOLOGY UNREMARKABLE   Final   RBC Morphology  09/07/2024 MORPHOLOGY UNREMARKABLE   Final   Smear Review 09/07/2024 Normal platelet morphology   Final   Immature Granulocytes 09/07/2024 0  % Final   Abs Immature Granulocytes 09/07/2024 0.02  0.00 - 0.07 K/uL Final   Performed at [facility], [address], [place], [place] [address]   Sodium 09/07/2024 137  135 - 145 mmol/L Final   Potassium 09/07/2024 4.0  3.5 - 5.1 mmol/L Final   Chloride 09/07/2024 101  98 - 111 mmol/L Final   CO2 09/07/2024 31  22 - 32 mmol/L Final  Glucose, Bld 09/07/2024 89  70 - 99 mg/dL Final   Glucose reference range applies only to samples taken after fasting for at least 8 hours.   BUN 09/07/2024 11  6 - 20 mg/dL Final   Creatinine, Ser 09/07/2024 0.73  0.44 - 1.00 mg/dL Final   Calcium  09/07/2024 8.8 (L)  8.9 - 10.3 mg/dL Final   Total Protein 87/91/7974 7.9  6.5 - 8.1 g/dL Final   Albumin 87/91/7974 3.9  3.5 - 5.0 g/dL Final   AST 87/91/7974 17  15 - 41 U/L Final   ALT 09/07/2024 12  0 - 44 U/L Final   Alkaline Phosphatase 09/07/2024 62  38 - 126 U/L Final   Total Bilirubin 09/07/2024 1.0  0.0 - 1.2 mg/dL Final   GFR, Estimated 09/07/2024 >60  >60 mL/min Final   Comment: (NOTE) Calculated using the CKD-EPI Creatinine Equation (2021)    Anion gap 09/07/2024 5  5 - 15 Final   Performed at [facility], [address] [place], [place] [address]   Alcohol, Ethyl (B) 09/07/2024 <15  <15 mg/dL Final   Comment: (NOTE) For medical purposes only. Performed at [facility], [address] [place], [place] [address]    TSH 09/07/2024 1.082  0.350 - 4.500 uIU/mL Final   Comment: Performed by a 3rd Generation assay with a functional sensitivity of <=0.01 uIU/mL. Performed at [facility], [address] [place], [place] [address]    Preg Test, Ur 09/07/2024 Negative  Negative Final   POC Amphetamine UR 09/07/2024 None Detected  NONE DETECTED (Cut Off Level 1000 ng/mL) Final   POC Secobarbital (BAR) 09/07/2024 None Detected  NONE  DETECTED (Cut Off Level 300 ng/mL) Final   POC Buprenorphine (BUP) 09/07/2024 None Detected  NONE DETECTED (Cut Off Level 10 ng/mL) Final   POC Oxazepam (BZO) 09/07/2024 None Detected  NONE DETECTED (Cut Off Level 300 ng/mL) Final   POC Cocaine UR 09/07/2024 None Detected  NONE DETECTED (Cut Off Level 300 ng/mL) Final   POC Methamphetamine UR 09/07/2024 None Detected  NONE DETECTED (Cut Off Level 1000 ng/mL) Final   POC Morphine  09/07/2024 None Detected  NONE DETECTED (Cut Off Level 300 ng/mL) Final   POC Methadone UR 09/07/2024 None Detected  NONE DETECTED (Cut Off Level 300 ng/mL) Final   POC Oxycodone  UR 09/07/2024 None Detected  NONE DETECTED (Cut Off Level 100 ng/mL) Final   POC Marijuana UR 09/07/2024 None Detected  NONE DETECTED (Cut Off Level 50 ng/mL) Final  Admission on 09/02/2024, Discharged on 09/03/2024  Component Date Value Ref Range Status   POC Amphetamine UR 09/02/2024 None Detected  NONE DETECTED (Cut Off Level 1000 ng/mL) Final   POC Secobarbital (BAR) 09/02/2024 None Detected  NONE DETECTED (Cut Off Level 300 ng/mL) Final   POC Buprenorphine (BUP) 09/02/2024 None Detected  NONE DETECTED (Cut Off Level 10 ng/mL) Final   POC Oxazepam (BZO) 09/02/2024 None Detected  NONE DETECTED (Cut Off Level 300 ng/mL) Final   POC Cocaine UR 09/02/2024 None Detected  NONE DETECTED (Cut Off Level 300 ng/mL) Final   POC Methamphetamine UR 09/02/2024 None Detected  NONE DETECTED (Cut Off Level 1000 ng/mL) Final   POC Morphine  09/02/2024 None Detected  NONE DETECTED (Cut Off Level 300 ng/mL) Final   POC Methadone UR 09/02/2024 None Detected  NONE DETECTED (Cut Off Level 300 ng/mL) Final   POC Oxycodone  UR 09/02/2024 None Detected  NONE DETECTED (Cut Off Level 100 ng/mL) Final  POC Marijuana UR 09/02/2024 None Detected  NONE DETECTED (Cut Off Level 50 ng/mL) Final   Preg Test, Ur 09/02/2024 Negative  Negative Final  Admission on 09/02/2024, Discharged on 09/02/2024  Component Date Value Ref  Range Status   SARS Coronavirus 2 by RT PCR 09/02/2024 NEGATIVE  NEGATIVE Final   Influenza A by PCR 09/02/2024 NEGATIVE  NEGATIVE Final   Influenza B by PCR 09/02/2024 NEGATIVE  NEGATIVE Final   Comment: (NOTE) The Xpert Xpress SARS-CoV-2/FLU/RSV plus assay is intended as an aid in the diagnosis of influenza from Nasopharyngeal swab specimens and should not be used as a sole basis for treatment. Nasal washings and aspirates are unacceptable for Xpert Xpress SARS-CoV-2/FLU/RSV testing.  Fact Sheet for Patients: bloggercourse.com  Fact Sheet for Healthcare Providers: seriousbroker.it  This test is not yet approved or cleared by the [place]  FDA and has been authorized for detection and/or diagnosis of SARS-CoV-2 by FDA under an Emergency Use Authorization (EUA). This EUA will remain in effect (meaning this test can be used) for the duration of the COVID-19 declaration under Section 564(b)(1) of the Act, 21 U.S.C. section 360bbb-3(b)(1), unless the authorization is terminated or revoked.     Resp Syncytial Virus by PCR 09/02/2024 NEGATIVE  NEGATIVE Final   Comment: (NOTE) Fact Sheet for Patients: bloggercourse.com  Fact Sheet for Healthcare Providers: seriousbroker.it  This test is not yet approved or cleared by the [place]  FDA and has been authorized for detection and/or diagnosis of SARS-CoV-2 by FDA under an Emergency Use Authorization (EUA). This EUA will remain in effect (meaning this test can be used) for the duration of the COVID-19 declaration under Section 564(b)(1) of the Act, 21 U.S.C. section 360bbb-3(b)(1), unless the authorization is terminated or revoked.  Performed at [facility], [address], [place], [place] [address]   Admission on 08/12/2024, Discharged on 08/13/2024  Component Date Value Ref Range Status   RPR Ser Ql 08/12/2024 NON  REACTIVE  NON REACTIVE Final   Performed at [facility], [address] [place], [place] [address]   Neisseria Gonorrhea 08/12/2024 Negative   Final   Chlamydia 08/12/2024 Negative   Final   Comment 08/12/2024 Normal Reference Ranger Chlamydia - Negative   Final   Comment 08/12/2024 Normal Reference Range Neisseria Gonorrhea - Negative   Final   TSH 08/12/2024 0.595  0.350 - 4.500 uIU/mL Final   Comment: Performed by a 3rd Generation assay with a functional sensitivity of <=0.01 uIU/mL. Performed at [facility], [address] [place], [place] [address]    Cholesterol 08/12/2024 126  0 - 200 mg/dL Final   Triglycerides 88/87/7974 93  <150 mg/dL Final   HDL 88/87/7974 35 (L)  >40 mg/dL Final   Total CHOL/HDL Ratio 08/12/2024 3.6  RATIO Final   VLDL 08/12/2024 19  0 - 40 mg/dL Final   LDL Cholesterol 08/12/2024 72  0 - 99 mg/dL Final   Comment:        Total Cholesterol/HDL:CHD Risk Coronary Heart Disease Risk Table                     Men   Women  1/2 Average Risk   3.4   3.3  Average Risk       5.0   4.4  2 X Average Risk   9.6   7.1  3 X Average Risk  23.4   11.0        Use the  calculated Patient Ratio above and the CHD Risk Table to determine the patient's CHD Risk.        ATP III CLASSIFICATION (LDL):  <100     mg/dL   Optimal  899-870  mg/dL   Near or Above                    Optimal  130-159  mg/dL   Borderline  839-810  mg/dL   High  >809     mg/dL   Very High Performed at [facility], [address] [place], [place] [address]    Magnesium  08/12/2024 2.0  1.7 - 2.4 mg/dL Final   Performed at [facility], [address] [place], [place] [address]   Hgb A1c MFr Bld 08/12/2024 6.0 (H)  4.8 - 5.6 % Final   Comment: (NOTE) Diagnosis of Diabetes The following HbA1c ranges recommended by the American Diabetes Association (ADA) may be used as an aid in the diagnosis of diabetes mellitus.  Hemoglobin             Suggested A1C NGSP%               Diagnosis  <5.7                   Non Diabetic  5.7-6.4                Pre-Diabetic  >6.4                   Diabetic  <7.0                   Glycemic control for                       adults with diabetes.     Mean Plasma Glucose 08/12/2024 125.5  mg/dL Final   Performed at [facility], [address] [place], [place] [address]   Sodium 08/12/2024 138  135 - 145 mmol/L Final   Potassium 08/12/2024 3.6  3.5 - 5.1 mmol/L Final   Chloride 08/12/2024 102  98 - 111 mmol/L Final   CO2 08/12/2024 24  22 - 32 mmol/L Final   Glucose, Bld 08/12/2024 108 (H)  70 - 99 mg/dL Final   Glucose reference range applies only to samples taken after fasting for at least 8 hours.   BUN 08/12/2024 14  6 - 20 mg/dL Final   Creatinine, Ser 08/12/2024 0.67  0.44 - 1.00 mg/dL Final   Calcium  08/12/2024 9.4  8.9 - 10.3 mg/dL Final   Total Protein 88/87/7974 8.9 (H)  6.5 - 8.1 g/dL Final   Albumin 88/87/7974 3.9  3.5 - 5.0 g/dL Final   AST 88/87/7974 16  15 - 41 U/L Final   ALT 08/12/2024 15  0 - 44 U/L Final   Alkaline Phosphatase 08/12/2024 63  38 - 126 U/L Final   Total Bilirubin 08/12/2024 0.6  0.0 - 1.2 mg/dL Final   GFR, Estimated 08/12/2024 >60  >60 mL/min Final   Comment: (NOTE) Calculated using the CKD-EPI Creatinine Equation (2021)    Anion gap 08/12/2024 12  5 - 15 Final   Performed at [facility], [address], [place], [place] [address]   WBC 08/12/2024 12.2 (H)  4.0 - 10.5 K/uL Final   RBC 08/12/2024 5.25 (H)  3.87 - 5.11 MIL/uL Final   Hemoglobin 08/12/2024 10.6 (L)  12.0 - 15.0 g/dL Final   HCT 88/87/7974 35.8 (L)  36.0 - 46.0 % Final   MCV 08/12/2024 68.2 (L)  80.0 - 100.0 fL Final   MCH 08/12/2024 20.2 (L)  26.0 - 34.0 pg Final   MCHC 08/12/2024 29.6 (L)  30.0 - 36.0 g/dL Final   RDW 88/87/7974 20.2 (H)  11.5 - 15.5 % Final   Platelets 08/12/2024 405 (H)  150 - 400 K/uL Final   nRBC 08/12/2024 0.0  0.0 - 0.2 % Final   Neutrophils Relative % 08/12/2024 53  % Final    Neutro Abs 08/12/2024 6.3  1.7 - 7.7 K/uL Final   Lymphocytes Relative 08/12/2024 42  % Final   Lymphs Abs 08/12/2024 5.2 (H)  0.7 - 4.0 K/uL Final   Monocytes Relative 08/12/2024 4  % Final   Monocytes Absolute 08/12/2024 0.5  0.1 - 1.0 K/uL Final   Eosinophils Relative 08/12/2024 1  % Final   Eosinophils Absolute 08/12/2024 0.1  0.0 - 0.5 K/uL Final   Basophils Relative 08/12/2024 0  % Final   Basophils Absolute 08/12/2024 0.0  0.0 - 0.1 K/uL Final   WBC Morphology 08/12/2024 MORPHOLOGY UNREMARKABLE   Final   Smear Review 08/12/2024 Normal platelet morphology   Final   Immature Granulocytes 08/12/2024 0  % Final   Abs Immature Granulocytes 08/12/2024 0.03  0.00 - 0.07 K/uL Final   Polychromasia 08/12/2024 PRESENT   Final   Performed at [facility], [address] [place], [place] [address]   Hepatitis B Surface Ag 08/12/2024 NON REACTIVE  NON REACTIVE Final   HCV Ab 08/12/2024 NON REACTIVE  NON REACTIVE Final   Comment: (NOTE) Nonreactive HCV antibody screen is consistent with no HCV infections,  unless recent infection is suspected or other evidence exists to indicate HCV infection.     Hep A IgM 08/12/2024 NON REACTIVE  NON REACTIVE Final   Hep B C IgM 08/12/2024 NON REACTIVE  NON REACTIVE Final   Performed at [facility], [address] [place], [place] [address]   Preg Test, Ur 08/12/2024 Negative  Negative Final   POC Amphetamine UR 08/12/2024 None Detected  NONE DETECTED (Cut Off Level 1000 ng/mL) Final   POC Secobarbital (BAR) 08/12/2024 None Detected  NONE DETECTED (Cut Off Level 300 ng/mL) Final   POC Buprenorphine (BUP) 08/12/2024 None Detected  NONE DETECTED (Cut Off Level 10 ng/mL) Final   POC Oxazepam (BZO) 08/12/2024 None Detected  NONE DETECTED (Cut Off Level 300 ng/mL) Final   POC Cocaine UR 08/12/2024 None Detected  NONE DETECTED (Cut Off Level 300 ng/mL) Final   POC Methamphetamine UR 08/12/2024 None Detected  NONE DETECTED (Cut Off Level 1000  ng/mL) Final   POC Morphine  08/12/2024 None Detected  NONE DETECTED (Cut Off Level 300 ng/mL) Final   POC Methadone UR 08/12/2024 None Detected  NONE DETECTED (Cut Off Level 300 ng/mL) Final   POC Oxycodone  UR 08/12/2024 None Detected  NONE DETECTED (Cut Off Level 100 ng/mL) Final   POC Marijuana UR 08/12/2024 None Detected  NONE DETECTED (Cut Off Level 50 ng/mL) Final  Admission on 07/28/2024, Discharged on 07/31/2024  Component Date Value Ref Range Status   Cholesterol 07/29/2024 116  0 - 200 mg/dL Final   Comment:        ATP III CLASSIFICATION:  <200     mg/dL   Desirable  799-760  mg/dL   Borderline High  >=759    mg/dL  High           Triglycerides 07/29/2024 56  <150 mg/dL Final   HDL 89/70/7974 33 (L)  >40 mg/dL Final   Total CHOL/HDL Ratio 07/29/2024 3.5  RATIO Final   VLDL 07/29/2024 11  0 - 40 mg/dL Final   LDL Cholesterol 07/29/2024 71  0 - 99 mg/dL Final   Comment:        Total Cholesterol/HDL:CHD Risk Coronary Heart Disease Risk Table                     Men   Women  1/2 Average Risk   3.4   3.3  Average Risk       5.0   4.4  2 X Average Risk   9.6   7.1  3 X Average Risk  23.4   11.0        Use the calculated Patient Ratio above and the CHD Risk Table to determine the patient's CHD Risk.        ATP III CLASSIFICATION (LDL):  <100     mg/dL   Optimal  899-870  mg/dL   Near or Above                    Optimal  130-159  mg/dL   Borderline  839-810  mg/dL   High  >809     mg/dL   Very High Performed at [facility], [address] [place], [place] [address]    Vit D, 25-Hydroxy 07/30/2024 11.48 (L)  30 - 100 ng/mL Final   Comment: (NOTE) Vitamin D  deficiency has been defined by the Institute of Medicine  and an Endocrine Society practice guideline as a level of serum 25-OH  vitamin D  less than 20 ng/mL (1,2). The Endocrine Society went on to  further define vitamin D  insufficiency as a level between 21 and 29  ng/mL (2).  1. IOM  (Institute of Medicine). 2010. Dietary reference intakes for  calcium  and D. [place]: [employer]. 2. Holick MF, Binkley [place], Bischoff-Ferrari HA, et al. Evaluation,  treatment, and prevention of vitamin D  deficiency: an Endocrine  Society clinical practice guideline, JCEM. 2011 Jul; 96(7): 1911-30.  Performed at [facility], [address], [place], [place] [address]   Admission on 07/27/2024, Discharged on 07/28/2024  Component Date Value Ref Range Status   WBC 07/28/2024 10.4  4.0 - 10.5 K/uL Final   RBC 07/28/2024 4.98  3.87 - 5.11 MIL/uL Final   Hemoglobin 07/28/2024 10.1 (L)  12.0 - 15.0 g/dL Final   HCT 89/71/7974 34.4 (L)  36.0 - 46.0 % Final   MCV 07/28/2024 69.1 (L)  80.0 - 100.0 fL Final   MCH 07/28/2024 20.3 (L)  26.0 - 34.0 pg Final   MCHC 07/28/2024 29.4 (L)  30.0 - 36.0 g/dL Final   RDW 89/71/7974 19.6 (H)  11.5 - 15.5 % Final   Platelets 07/28/2024 389  150 - 400 K/uL Final   REPEATED TO VERIFY   nRBC 07/28/2024 0.0  0.0 - 0.2 % Final   Neutrophils Relative % 07/28/2024 50  % Final   Neutro Abs 07/28/2024 5.2  1.7 - 7.7 K/uL Final   Lymphocytes Relative 07/28/2024 42  % Final   Lymphs Abs 07/28/2024 4.3 (H)  0.7 - 4.0 K/uL Final   Monocytes Relative 07/28/2024 6  % Final   Monocytes Absolute 07/28/2024 0.6  0.1 - 1.0 K/uL Final  Eosinophils Relative 07/28/2024 1  % Final   Eosinophils Absolute 07/28/2024 0.1  0.0 - 0.5 K/uL Final   Basophils Relative 07/28/2024 1  % Final   Basophils Absolute 07/28/2024 0.1  0.0 - 0.1 K/uL Final   WBC Morphology 07/28/2024 MORPHOLOGY UNREMARKABLE   Final   RBC Morphology 07/28/2024 MORPHOLOGY UNREMARKABLE   Final   Smear Review 07/28/2024 Normal platelet morphology   Final   Immature Granulocytes 07/28/2024 0  % Final   Abs Immature Granulocytes 07/28/2024 0.04  0.00 - 0.07 K/uL Final   Performed at [facility], [address], [place], [place] [address]   Sodium 07/28/2024 137  135 - 145  mmol/L Final   Potassium 07/28/2024 4.2  3.5 - 5.1 mmol/L Final   Chloride 07/28/2024 101  98 - 111 mmol/L Final   CO2 07/28/2024 25  22 - 32 mmol/L Final   Glucose, Bld 07/28/2024 95  70 - 99 mg/dL Final   Glucose reference range applies only to samples taken after fasting for at least 8 hours.   BUN 07/28/2024 13  6 - 20 mg/dL Final   Creatinine, Ser 07/28/2024 0.75  0.44 - 1.00 mg/dL Final   Calcium  07/28/2024 9.4  8.9 - 10.3 mg/dL Final   Total Protein 89/71/7974 8.0  6.5 - 8.1 g/dL Final   Albumin 89/71/7974 3.9  3.5 - 5.0 g/dL Final   AST 89/71/7974 15  15 - 41 U/L Final   ALT 07/28/2024 11  0 - 44 U/L Final   Alkaline Phosphatase 07/28/2024 65  38 - 126 U/L Final   Total Bilirubin 07/28/2024 0.7  0.0 - 1.2 mg/dL Final   GFR, Estimated 07/28/2024 >60  >60 mL/min Final   Comment: (NOTE) Calculated using the CKD-EPI Creatinine Equation (2021)    Anion gap 07/28/2024 11  5 - 15 Final   Performed at [facility], [address], [place], [place] [address]   Hgb A1c MFr Bld 07/28/2024 5.9 (H)  4.8 - 5.6 % Final   Comment: (NOTE) Diagnosis of Diabetes The following HbA1c ranges recommended by the American Diabetes Association (ADA) may be used as an aid in the diagnosis of diabetes mellitus.  Hemoglobin             Suggested A1C NGSP%              Diagnosis  <5.7                   Non Diabetic  5.7-6.4                Pre-Diabetic  >6.4                   Diabetic  <7.0                   Glycemic control for                       adults with diabetes.     Mean Plasma Glucose 07/28/2024 122.63  mg/dL Final   Performed at [facility], [address] [place], [place] [address]   Preg Test, Ur 07/27/2024 Negative  Negative Final   POC Amphetamine UR 07/27/2024 None Detected  NONE DETECTED (Cut Off Level 1000 ng/mL) Final   POC Secobarbital (BAR) 07/27/2024 None Detected  NONE DETECTED (Cut Off Level 300 ng/mL) Final   POC Buprenorphine (BUP) 07/27/2024 None  Detected  NONE  DETECTED (Cut Off Level 10 ng/mL) Final   POC Oxazepam (BZO) 07/27/2024 None Detected  NONE DETECTED (Cut Off Level 300 ng/mL) Final   POC Cocaine UR 07/27/2024 None Detected  NONE DETECTED (Cut Off Level 300 ng/mL) Final   POC Methamphetamine UR 07/27/2024 None Detected  NONE DETECTED (Cut Off Level 1000 ng/mL) Final   POC Morphine  07/27/2024 None Detected  NONE DETECTED (Cut Off Level 300 ng/mL) Final   POC Methadone UR 07/27/2024 None Detected  NONE DETECTED (Cut Off Level 300 ng/mL) Final   POC Oxycodone  UR 07/27/2024 None Detected  NONE DETECTED (Cut Off Level 100 ng/mL) Final   POC Marijuana UR 07/27/2024 None Detected  NONE DETECTED (Cut Off Level 50 ng/mL) Final  Admission on 06/22/2024, Discharged on 06/23/2024  Component Date Value Ref Range Status   WBC 06/22/2024 10.4  4.0 - 10.5 K/uL Final   RBC 06/22/2024 4.93  3.87 - 5.11 MIL/uL Final   Hemoglobin 06/22/2024 10.1 (L)  12.0 - 15.0 g/dL Final   HCT 90/77/7974 34.2 (L)  36.0 - 46.0 % Final   MCV 06/22/2024 69.4 (L)  80.0 - 100.0 fL Final   MCH 06/22/2024 20.5 (L)  26.0 - 34.0 pg Final   MCHC 06/22/2024 29.5 (L)  30.0 - 36.0 g/dL Final   RDW 90/77/7974 19.3 (H)  11.5 - 15.5 % Final   Platelets 06/22/2024 377  150 - 400 K/uL Final   REPEATED TO VERIFY   nRBC 06/22/2024 0.0  0.0 - 0.2 % Final   Neutrophils Relative % 06/22/2024 52  % Final   Neutro Abs 06/22/2024 5.4  1.7 - 7.7 K/uL Final   Lymphocytes Relative 06/22/2024 40  % Final   Lymphs Abs 06/22/2024 4.2 (H)  0.7 - 4.0 K/uL Final   Monocytes Relative 06/22/2024 6  % Final   Monocytes Absolute 06/22/2024 0.6  0.1 - 1.0 K/uL Final   Eosinophils Relative 06/22/2024 1  % Final   Eosinophils Absolute 06/22/2024 0.1  0.0 - 0.5 K/uL Final   Basophils Relative 06/22/2024 1  % Final   Basophils Absolute 06/22/2024 0.1  0.0 - 0.1 K/uL Final   Immature Granulocytes 06/22/2024 0  % Final   Abs Immature Granulocytes 06/22/2024 0.02  0.00 - 0.07 K/uL Final    Performed at [facility], [address], [place], [place] [address]   Sodium 06/22/2024 135  135 - 145 mmol/L Final   Potassium 06/22/2024 3.2 (L)  3.5 - 5.1 mmol/L Final   Chloride 06/22/2024 100  98 - 111 mmol/L Final   CO2 06/22/2024 23  22 - 32 mmol/L Final   Glucose, Bld 06/22/2024 148 (H)  70 - 99 mg/dL Final   Glucose reference range applies only to samples taken after fasting for at least 8 hours.   BUN 06/22/2024 13  6 - 20 mg/dL Final   Creatinine, Ser 06/22/2024 0.74  0.44 - 1.00 mg/dL Final   Calcium  06/22/2024 9.1  8.9 - 10.3 mg/dL Final   Total Protein 90/77/7974 7.9  6.5 - 8.1 g/dL Final   Albumin 90/77/7974 3.5  3.5 - 5.0 g/dL Final   AST 90/77/7974 16  15 - 41 U/L Final   ALT 06/22/2024 9  0 - 44 U/L Final   Alkaline Phosphatase 06/22/2024 61  38 - 126 U/L Final   Total Bilirubin 06/22/2024 0.8  0.0 - 1.2 mg/dL Final   GFR, Estimated 06/22/2024 >60  >60 mL/min Final   Comment: (NOTE) Calculated using the  CKD-EPI Creatinine Equation (2021)    Anion gap 06/22/2024 12  5 - 15 Final   Performed at [facility], [address], [place], [place] [address]   Color, Urine 06/22/2024 YELLOW  YELLOW Final   APPearance 06/22/2024 HAZY (A)  CLEAR Final   Specific Gravity, Urine 06/22/2024 1.019  1.005 - 1.030 Final   pH 06/22/2024 5.0  5.0 - 8.0 Final   Glucose, UA 06/22/2024 NEGATIVE  NEGATIVE mg/dL Final   Hgb urine dipstick 06/22/2024 LARGE (A)  NEGATIVE Final   Bilirubin Urine 06/22/2024 NEGATIVE  NEGATIVE Final   Ketones, ur 06/22/2024 NEGATIVE  NEGATIVE mg/dL Final   Protein, ur 90/77/7974 30 (A)  NEGATIVE mg/dL Final   Nitrite 90/77/7974 NEGATIVE  NEGATIVE Final   Leukocytes,Ua 06/22/2024 SMALL (A)  NEGATIVE Final   RBC / HPF 06/22/2024 >50  0 - 5 RBC/hpf Final   WBC, UA 06/22/2024 6-10  0 - 5 WBC/hpf Final   Bacteria, UA 06/22/2024 FEW (A)  NONE SEEN Final   Squamous Epithelial / HPF 06/22/2024 0-5  0 - 5 /HPF Final   Mucus 06/22/2024 PRESENT   Final    Performed at [facility], [address], [place], [place] [address]   hCG, Beta Chain, Quant, S 06/22/2024 <1  <5 mIU/mL Final   Comment:          GEST. AGE      CONC.  (mIU/mL)   <=1 WEEK        5 - 50     2 WEEKS       50 - 500     3 WEEKS       100 - 10,000     4 WEEKS     1,000 - 30,000     5 WEEKS     3,500 - 115,000   6-8 WEEKS     12,000 - 270,000    12 WEEKS     15,000 - 220,000        FEMALE AND NON-PREGNANT FEMALE:     LESS THAN 5 mIU/mL Performed at [facility], [address], [place], [place] [address]    Lipase 06/22/2024 20  11 - 51 U/L Final   Performed at [facility], [address], [place], [place] [address]   Specimen Description 06/23/2024 URINE, CLEAN CATCH   Final   Special Requests 06/23/2024    Final                   Value:NONE Performed at [facility], [address], [place], [place] [address]    Culture 06/23/2024 MULTIPLE SPECIES PRESENT, SUGGEST RECOLLECTION (A)   Final   Report Status 06/23/2024 06/23/2024 FINAL   Final   Yeast Wet Prep HPF POC 06/23/2024 NONE SEEN  NONE SEEN Final   Trich, Wet Prep 06/23/2024 NONE SEEN  NONE SEEN Final   Clue Cells Wet Prep HPF POC 06/23/2024 NONE SEEN  NONE SEEN Final   WBC, Wet Prep HPF POC 06/23/2024 <10  <10 Final   Sperm 06/23/2024 NONE SEEN   Final   Performed at [facility], [address], [place], [place] [address]   Neisseria Gonorrhea 06/23/2024 Negative   Final   Chlamydia 06/23/2024 Negative   Final   Comment 06/23/2024 Normal Reference Ranger Chlamydia - Negative   Final   Comment 06/23/2024 Normal Reference Range Neisseria Gonorrhea - Negative   Final  Admission on  05/31/2024, Discharged on 05/31/2024  Component Date Value Ref Range Status   Specimen Source 05/31/2024 URINE, CLEAN CATCH   Final   Color, Urine 05/31/2024 YELLOW  YELLOW Final   APPearance 05/31/2024 CLEAR  CLEAR Final   Specific Gravity, Urine 05/31/2024 1.017  1.005 - 1.030 Final    pH 05/31/2024 5.0  5.0 - 8.0 Final   Glucose, UA 05/31/2024 NEGATIVE  NEGATIVE mg/dL Final   Hgb urine dipstick 05/31/2024 NEGATIVE  NEGATIVE Final   Bilirubin Urine 05/31/2024 NEGATIVE  NEGATIVE Final   Ketones, ur 05/31/2024 NEGATIVE  NEGATIVE mg/dL Final   Protein, ur 91/68/7974 NEGATIVE  NEGATIVE mg/dL Final   Nitrite 91/68/7974 NEGATIVE  NEGATIVE Final   Leukocytes,Ua 05/31/2024 NEGATIVE  NEGATIVE Final   RBC / HPF 05/31/2024 0-5  0 - 5 RBC/hpf Final   WBC, UA 05/31/2024 0-5  0 - 5 WBC/hpf Final   Comment:        Reflex urine culture not performed if WBC <=10, OR if Squamous epithelial cells >5. If Squamous epithelial cells >5 suggest recollection.    Bacteria, UA 05/31/2024 RARE (A)  NONE SEEN Final   Squamous Epithelial / HPF 05/31/2024 0-5  0 - 5 /HPF Final   Mucus 05/31/2024 PRESENT   Final   Performed at [facility], [address], [place], [place] [address]  Admission on 05/07/2024, Discharged on 05/07/2024  Component Date Value Ref Range Status   Color, Urine 05/07/2024 YELLOW  YELLOW Final   APPearance 05/07/2024 CLEAR  CLEAR Final   Specific Gravity, Urine 05/07/2024 1.014  1.005 - 1.030 Final   pH 05/07/2024 7.0  5.0 - 8.0 Final   Glucose, UA 05/07/2024 NEGATIVE  NEGATIVE mg/dL Final   Hgb urine dipstick 05/07/2024 NEGATIVE  NEGATIVE Final   Bilirubin Urine 05/07/2024 NEGATIVE  NEGATIVE Final   Ketones, ur 05/07/2024 NEGATIVE  NEGATIVE mg/dL Final   Protein, ur 91/92/7974 NEGATIVE  NEGATIVE mg/dL Final   Nitrite 91/92/7974 NEGATIVE  NEGATIVE Final   Leukocytes,Ua 05/07/2024 NEGATIVE  NEGATIVE Final   Performed at [facility], [address] [place], [place] [address]  Admission on 05/02/2024, Discharged on 05/02/2024  Component Date Value Ref Range Status   WBC 05/02/2024 9.4  4.0 - 10.5 K/uL Final   RBC 05/02/2024 4.77  3.87 - 5.11 MIL/uL Final   Hemoglobin 05/02/2024 9.7 (L)  12.0 - 15.0 g/dL Final   HCT 91/97/7974 33.2 (L)  36.0 - 46.0 % Final    MCV 05/02/2024 69.6 (L)  80.0 - 100.0 fL Final   MCH 05/02/2024 20.3 (L)  26.0 - 34.0 pg Final   MCHC 05/02/2024 29.2 (L)  30.0 - 36.0 g/dL Final   RDW 91/97/7974 19.3 (H)  11.5 - 15.5 % Final   Platelets 05/02/2024 350  150 - 400 K/uL Final   nRBC 05/02/2024 0.0  0.0 - 0.2 % Final   Neutrophils Relative % 05/02/2024 62  % Final   Neutro Abs 05/02/2024 5.8  1.7 - 7.7 K/uL Final   Lymphocytes Relative 05/02/2024 32  % Final   Lymphs Abs 05/02/2024 3.0  0.7 - 4.0 K/uL Final   Monocytes Relative 05/02/2024 2  % Final   Monocytes Absolute 05/02/2024 0.2  0.1 - 1.0 K/uL Final   Eosinophils Relative 05/02/2024 2  % Final   Eosinophils Absolute 05/02/2024 0.2  0.0 - 0.5 K/uL Final   Basophils Relative 05/02/2024 2  % Final   Basophils Absolute 05/02/2024 0.2 (H)  0.0 -  0.1 K/uL Final   WBC Morphology 05/02/2024 See Note   Final    Morphology unremarkable   RBC Morphology 05/02/2024 MORPHOLOGY UNREMARKABLE   Final    Morphology unremarkable   Smear Review 05/02/2024 See Note   Final   Comment:  Normal Platelet Morphology Performed at [facility], [address], [place], [place] [address]    Sodium 05/02/2024 137  135 - 145 mmol/L Final   Potassium 05/02/2024 3.6  3.5 - 5.1 mmol/L Final   Chloride 05/02/2024 105  98 - 111 mmol/L Final   CO2 05/02/2024 22  22 - 32 mmol/L Final   Glucose, Bld 05/02/2024 144 (H)  70 - 99 mg/dL Final   Glucose reference range applies only to samples taken after fasting for at least 8 hours.   BUN 05/02/2024 9  6 - 20 mg/dL Final   Creatinine, Ser 05/02/2024 0.74  0.44 - 1.00 mg/dL Final   Calcium  05/02/2024 9.1  8.9 - 10.3 mg/dL Final   Total Protein 91/97/7974 7.7  6.5 - 8.1 g/dL Final   Albumin 91/97/7974 3.4 (L)  3.5 - 5.0 g/dL Final   AST 91/97/7974 13 (L)  15 - 41 U/L Final   ALT 05/02/2024 9  0 - 44 U/L Final   Alkaline Phosphatase 05/02/2024 57  38 - 126 U/L Final   Total Bilirubin 05/02/2024 0.7  0.0 - 1.2 mg/dL Final   GFR, Estimated  05/02/2024 >60  >60 mL/min Final   Comment: (NOTE) Calculated using the CKD-EPI Creatinine Equation (2021)    Anion gap 05/02/2024 10  5 - 15 Final   Performed at [facility], [address], [place], [place] [address]   Lipase 05/02/2024 24  11 - 51 U/L Final   Performed at [facility], [address], [place], [place] [address]  There may be more visits with results that are not included.    Allergies: Latex, Zithromax  [azithromycin ], and Zofran  [ondansetron ]  Medications:  Facility Ordered Medications  Medication   acetaminophen  (TYLENOL ) tablet 650 mg   alum & mag hydroxide-simeth (MAALOX/MYLANTA) 200-200-20 MG/5ML suspension 30 mL   magnesium  hydroxide (MILK OF MAGNESIA) suspension 30 mL   haloperidol  (HALDOL ) tablet 5 mg   And   diphenhydrAMINE  (BENADRYL ) capsule 50 mg   haloperidol  lactate (HALDOL ) injection 5 mg   And   diphenhydrAMINE  (BENADRYL ) injection 50 mg   And   LORazepam  (ATIVAN ) injection 2 mg   haloperidol  lactate (HALDOL ) injection 10 mg   And   diphenhydrAMINE  (BENADRYL ) injection 50 mg   And   LORazepam  (ATIVAN ) injection 2 mg   hydrOXYzine  (ATARAX ) tablet 25 mg   traZODone  (DESYREL ) tablet 50 mg   albuterol  (VENTOLIN  HFA) 108 (90 Base) MCG/ACT inhaler 2 puff   ARIPiprazole  (ABILIFY ) tablet 15 mg   [START ON 09/14/2024] NIFEdipine  (PROCARDIA -XL/NIFEDICAL-XL) 24 hr tablet 30 mg   PTA Medications  Medication Sig   albuterol  (VENTOLIN  HFA) 108 (90 Base) MCG/ACT inhaler Inhale 2 puffs into the lungs every 4 (four) hours as needed for wheezing or shortness of breath.   Vitamin D , Ergocalciferol , (DRISDOL ) 1.25 MG (50000 UNIT) CAPS capsule Take 1 capsule (50,000 Units total) by mouth every 7 (seven) days.   ARIPiprazole  (ABILIFY ) 15 MG tablet Take 1 tablet (15 mg total) by mouth daily.   amoxicillin  (AMOXIL ) 500 MG capsule Take 1 capsule (500 mg total) by mouth 3 (three) times daily. (Patient not taking: Reported on 09/13/2024)   hydrOXYzine   (ATARAX )  25 MG tablet Take 1 tablet (25 mg total) by mouth every 8 (eight) hours as needed for anxiety. (Patient not taking: Reported on 09/13/2024)   traZODone  (DESYREL ) 100 MG tablet Take 1 tablet (100 mg total) by mouth at bedtime as needed for sleep. (Patient not taking: Reported on 09/13/2024)   NIFEdipine  (ADALAT  CC) 30 MG 24 hr tablet Take 1 tablet (30 mg total) by mouth daily. (Patient not taking: Reported on 09/13/2024)   Medical Decision Making  -Overnight hold at the observation unit at the [facility], with a plan to discharge patient on 09/14/2024 after getting her connected to community resources.  - Started home medications, see above. -Ordered some baseline labs, such as vitamin D , B12, repeat CMP, and CBC as well as TIBC related to a history of anemia.  Ordered UDS, baseline EKG, abstained from repeating ordered labs previously completed in the past 2 weeks.  - Ordered agitation protocol as noted above.    Recommendations  Monitor per unit protocol.  Donia Snell, NP 09/13/2024  2:39 PM
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
## 2024-09-14 MED ORDER — ADULT MULTIVITAMIN W/MINERALS CH
1.0000 | ORAL_TABLET | Freq: Every day | ORAL | Status: DC
  Filled 2024-09-14 (×2): qty 1

## 2024-09-14 MED ORDER — FERROUS SULFATE 325 (65 FE) MG PO TABS
325.0000 mg | ORAL_TABLET | Freq: Every day | ORAL | Status: DC
  Administered 2024-09-14: 09:00:00 325 mg via ORAL
  Filled 2024-09-14 (×2): qty 1

## 2024-09-14 NOTE — ED Notes (Addendum)
 Attempted to contact [facility] 5 times with no success. Voicemail full and unable to leave a message.

## 2024-09-14 NOTE — Progress Notes (Addendum)
 Pt has been accepted to [facility] on 09/14/2024  Bed assignment: 2W   Pt meets inpatient criteria per Lynwood Morene Bash, MD   Attending Physician will be Dr. Carmin   Report can be called to: [phone]  Pt can arrive 8AM   Care Team Notified: Zaira Byrd, RN

## 2024-09-14 NOTE — ED Notes (Signed)
 Pt requested for food, stated she didn't get to eat dinner. Meal provided.

## 2024-09-14 NOTE — ED Notes (Signed)
 Pt is sleeping at this moment. Respirations are  even and labored. No acute distress noted.

## 2024-09-14 NOTE — Progress Notes (Signed)
 Inpatient Psychiatric Referral  Patient was recommended inpatient per Lynwood Morene Bash, MD  . There are no available beds at [facility], per [facility] . Patient was referred to the following out of network facilities:  Destination  Service Provider Address Phone Fax  [facility]  [address], [place] [address] [phone]  [facility]  [address] [place], [place] [address] [phone]  [facility]  [address] [place] [address] [phone]  [facility]  [address], [place] [address] [phone]  [facility]  [address], [place] [address] [phone]  [facility] EFAX  [address] [place], [place] [phone]  [facility]  [address] [place] [address] [phone]  [facility]  [address] [place] [address] [phone]    Situation ongoing, CSW to continue following and update chart as more information becomes available.   Harrie Sofia MSW, [place] 09/14/2024

## 2024-09-14 NOTE — ED Notes (Signed)
 Pt A&O x 4, awake & resting at present.  No distress noted, calm & cooperative. Monitoring for safety.

## 2024-09-14 NOTE — ED Notes (Signed)
 Patient experienced n/v in trash can. Patient stated she had taken meds earlier this morning and ate a salad which made her sick and could not tolerate the smell. Patient did not want any nausea medication and is letting her stomach settle. Patient denies any further nausea at this moment and reports no further needs at this time. Patient remains safe on unit with no s/s of current distress.

## 2024-09-14 NOTE — Discharge Instructions (Addendum)
 Outpatient Therapy and Psychiatry Resources for Patients: Your psychiatric needs would be well-served by consultation and regular meetings with an outpatient therapist to assist you with your mood-related conditions. Here are a series of links for finding a therapist.     Includes links to the following: [facility] (http://wilson-mayo.com/) (only for [facility] and please reserve for uninsured) [facility] (http://blankenship-martinez.[email or web address]/) Psychology Today Special educational needs teacher (https://www.psychologytoday.com/us/therapists) Psychology Today Support Group Finder (https://www.psychologytoday.com/us/groups/) [facility] Location (https://carolinabehavioralcare.com/staff-location/[place]/) [facility] [place] - Support Group Finder - (RecordDebt.fi) [facility] [employer] - Lexicographer (https://fspcares.org/contact/) [employer] for Mental Health [place] - NAMI (https://namigu[place].org/support-and-education/support-groups/) Interior and spatial designer Health - Affiliated with [facility] (https://www.[place].com/lb/locations/profile/[facility]-health-[place]-behavioral-medicine-at-[address]/) Dept of Health and Human Services - Find a mental health facility (http://lester.info/)

## 2024-09-14 NOTE — ED Notes (Signed)
 Pt sleeping at present, no distress noted.  Monitoring for safety.
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
## 2024-09-14 NOTE — ED Notes (Signed)
 Patient experiencing more n/v after eating some lunch. Patient provided with some water  and is no longer currently vomiting. VS taking and are WDL. Provider notified. Patient currently back in recliner resting. No s/s of current distress.

## 2024-09-14 NOTE — ED Notes (Signed)
 Patient ate dinner with no n/v this evening. Patient aware of pending transfer to [facility]. Patient remains cooperative in unit. No s/s of current distress.

## 2024-09-15 NOTE — ED Notes (Addendum)
 Writer attempted to give pt her scheduled medication but pt refused. Pt states, You might as well go ahead and put me down for a refusal. I don't want to take any of those medicines. They all make me sick to my stomach. Writer attempted encouraging pt to take prescribed medications but unsuccessful. Provider made aware via secure chat. Report given to [place]na, CHARITY FUNDRAISER at [facility]. Pt made aware of her transport to [facility] for continued stay. Pt verbalized understanding.  Pt sitting in chair talking to self. When writer asked if pt was hallucinating or speaking with someone not seen, pt denies. Pt states, Sometimes I just talk to myself. Writer informed pt to notify staff with any needs or concerns. Pt deny SI/HI/AVH. Will continue to monitor for safety. Safe transport called to transport pt to [facility].

## 2024-09-15 NOTE — ED Notes (Signed)
 Patient A&O x 4, ambulatory. Patient transferred to [facility] via safe transport. Pt belongings given to safe transport driver from secured locker intact. Patient escorted to [facility] via staff for transport to destination. Safety maintained.

## 2024-09-15 NOTE — ED Notes (Signed)
 Patient is awake and alert lying in bed without issue.  Patient is voluntary and has a bed pending at [facility].

## 2024-09-15 NOTE — ED Notes (Signed)
 Pt sleeping at present, no distress noted.  Monitoring for safety.
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
## 2024-09-30 ENCOUNTER — Emergency Department (HOSPITAL_COMMUNITY)
Admission: EM | Admit: 2024-09-30 | Discharge: 2024-09-30 | Discharge status: Against Medical Advice | Payer: MEDICAID | Attending: Nurse Practitioner | Admitting: Nurse Practitioner

## 2024-09-30 ENCOUNTER — Other Ambulatory Visit: Payer: Self-pay

## 2024-09-30 ENCOUNTER — Ambulatory Visit (HOSPITAL_COMMUNITY)
Admission: EM | Admit: 2024-09-30 | Discharge: 2024-09-30 | Disposition: A | Discharge status: Home / Self Care | Payer: MEDICAID | Source: Home / Self Care

## 2024-09-30 DIAGNOSIS — F603 Borderline personality disorder: Secondary | ICD-10-CM | POA: Insufficient documentation

## 2024-09-30 DIAGNOSIS — F4310 Post-traumatic stress disorder, unspecified: Secondary | ICD-10-CM | POA: Insufficient documentation

## 2024-09-30 DIAGNOSIS — Z765 Malingerer [conscious simulation]: Secondary | ICD-10-CM | POA: Insufficient documentation

## 2024-09-30 DIAGNOSIS — I10 Essential (primary) hypertension: Secondary | ICD-10-CM | POA: Insufficient documentation

## 2024-09-30 DIAGNOSIS — F410 Panic disorder [episodic paroxysmal anxiety] without agoraphobia: Secondary | ICD-10-CM | POA: Insufficient documentation

## 2024-09-30 DIAGNOSIS — Z5321 Procedure and treatment not carried out due to patient leaving prior to being seen by health care provider: Secondary | ICD-10-CM | POA: Diagnosis not present

## 2024-09-30 DIAGNOSIS — Z91148 Patient's other noncompliance with medication regimen for other reason: Secondary | ICD-10-CM | POA: Insufficient documentation

## 2024-09-30 DIAGNOSIS — F419 Anxiety disorder, unspecified: Secondary | ICD-10-CM | POA: Insufficient documentation

## 2024-09-30 DIAGNOSIS — F3162 Bipolar disorder, current episode mixed, moderate: Secondary | ICD-10-CM | POA: Insufficient documentation

## 2024-09-30 DIAGNOSIS — Z79899 Other long term (current) drug therapy: Secondary | ICD-10-CM | POA: Insufficient documentation

## 2024-09-30 DIAGNOSIS — Z5900 Homelessness unspecified: Secondary | ICD-10-CM | POA: Insufficient documentation

## 2024-09-30 DIAGNOSIS — R519 Headache, unspecified: Secondary | ICD-10-CM | POA: Insufficient documentation

## 2024-09-30 DIAGNOSIS — L292 Pruritus vulvae: Secondary | ICD-10-CM | POA: Diagnosis not present

## 2024-09-30 DIAGNOSIS — R112 Nausea with vomiting, unspecified: Secondary | ICD-10-CM | POA: Insufficient documentation

## 2024-09-30 LAB — URINALYSIS, ROUTINE W REFLEX MICROSCOPIC
Bilirubin Urine: NEGATIVE
Glucose, UA: NEGATIVE mg/dL
Hgb urine dipstick: NEGATIVE
Ketones, ur: NEGATIVE mg/dL
Nitrite: NEGATIVE
Protein, ur: 30 mg/dL — AB
Specific Gravity, Urine: 1.015 (ref 1.005–1.030)
pH: 6 (ref 5.0–8.0)

## 2024-09-30 LAB — CBC WITH DIFFERENTIAL/PLATELET
Abs Immature Granulocytes: 0.05 K/uL (ref 0.00–0.07)
Basophils Absolute: 0.1 K/uL (ref 0.0–0.1)
Basophils Relative: 1 %
Eosinophils Absolute: 0.1 K/uL (ref 0.0–0.5)
Eosinophils Relative: 1 %
HCT: 33.4 % — ABNORMAL LOW (ref 36.0–46.0)
Hemoglobin: 10.1 g/dL — ABNORMAL LOW (ref 12.0–15.0)
Immature Granulocytes: 0 %
Lymphocytes Relative: 36 %
Lymphs Abs: 4.5 K/uL — ABNORMAL HIGH (ref 0.7–4.0)
MCH: 20.9 pg — ABNORMAL LOW (ref 26.0–34.0)
MCHC: 30.2 g/dL (ref 30.0–36.0)
MCV: 69 fL — ABNORMAL LOW (ref 80.0–100.0)
Monocytes Absolute: 0.6 K/uL (ref 0.1–1.0)
Monocytes Relative: 5 %
Neutro Abs: 7.1 K/uL (ref 1.7–7.7)
Neutrophils Relative %: 57 %
Platelets: 388 K/uL (ref 150–400)
RBC: 4.84 MIL/uL (ref 3.87–5.11)
RDW: 20.6 % — ABNORMAL HIGH (ref 11.5–15.5)
Smear Review: NORMAL
WBC: 12.3 K/uL — ABNORMAL HIGH (ref 4.0–10.5)
nRBC: 0 % (ref 0.0–0.2)

## 2024-09-30 LAB — BASIC METABOLIC PANEL WITH GFR
Anion gap: 13 (ref 5–15)
BUN: 13 mg/dL (ref 6–20)
CO2: 23 mmol/L (ref 22–32)
Calcium: 9.3 mg/dL (ref 8.9–10.3)
Chloride: 101 mmol/L (ref 98–111)
Creatinine, Ser: 0.62 mg/dL (ref 0.44–1.00)
GFR, Estimated: >60 mL/min
Glucose, Bld: 105 mg/dL — ABNORMAL HIGH (ref 70–99)
Potassium: 3.7 mmol/L (ref 3.5–5.1)
Sodium: 137 mmol/L (ref 135–145)

## 2024-09-30 MED ORDER — CLONIDINE HCL 0.1 MG PO TABS
0.1000 mg | ORAL_TABLET | Freq: Once | ORAL | Status: DC
  Filled 2024-09-30: qty 1

## 2024-09-30 MED ORDER — NIFEDIPINE ER OSMOTIC RELEASE 30 MG PO TB24
30.0000 mg | ORAL_TABLET | Freq: Once | ORAL | Status: DC
  Filled 2024-09-30: qty 1

## 2024-09-30 MED ORDER — ACETAMINOPHEN 500 MG PO TABS
1000.0000 mg | ORAL_TABLET | Freq: Once | ORAL | Status: AC
  Administered 2024-09-30: 1000 mg via ORAL
  Filled 2024-09-30: qty 2

## 2024-09-30 NOTE — ED Notes (Signed)
 Patient left.

## 2024-09-30 NOTE — ED Provider Notes (Cosign Needed Addendum)
 Behavioral Health Urgent Care Medical Screening Exam  Patient Name: Melinda Jimenez MRN: [id number] Date of Evaluation: 09/30/2024 Chief Complaint:   I'm not hurting right now,.  Diagnosis:  Final diagnoses:  Malingering  Homelessness unspecified  Non compliance w medication regimen    History of Present illness: Melinda Jimenez is a 38 y.o. female.  With psychiatric history of bipolar 1 disorder, borderline personality disorder, PTSD, anxiety, panic attack and medical history of chronic hypertension, who presented voluntarily as a walk-in to [facility] with complaints of headache and constantly feeling that people are bothering her.  Patient was seen face-to-face by this provider and chart reviewed.  Per chart review, patient has a history of frequent ED/urgent care visits and 2 inpatient psychiatric hospitalizations within 6 months. Patient presented to [facility] tonight with complaint of headache and reports she was given two tylenol  and water  before eloping. She denies current headache.  She reports having headache off-and-on for a week and being unable to sleep well. Patient declined comment on when asked what she is currently using to manage her headaches.  Instead she reports I'm tired of this headaches, if I go home and the headache come back, I'm a flip out and go back to the ED and come back here.  Per chart review, patient has a history of chronic headaches and is prescribed nifedipine  30 mg 24 hr tablet. Patient was asked if she takes this medication as prescribed to manage her hypertension.  She is silent and does not report respond.  Per chart review, patient was dispensed 30 tablets starting 09/09/2024. Patient was offered this medication here prior to discharge and she refused, stating she just needed water  to calm down. Patient encouraged to take a dose when she gets home tonight.  Patient education provided on the risks of not taking her BP medications.  Patient encouraged to  follow-up with her outpatient PCP for medical symptoms and to go to the ED or call 911 if symptoms persist or worsen.  Patient denies illicit substance use.  Patient reports she is compliant with her Abilify  which she takes daily.Patient reports she lives alone and feels safe returning home.  She reports being established with a psychiatrist.   On evaluation, patient is alert, oriented x 3, and cooperative. Speech is clear and coherent. Pt appears casually dressed. Eye contact is good. Mood is euthymic, affect is congruent with mood. Thought process is coherent and thought content is WDL. Pt denies SI/HI/AVH. There is no objective indication that the patient is responding to internal stimuli. No delusions elicited during this assessment.    Discussed recommendation for discharge and follow-up with outpatient providers for med management and other medical needs. Patient is provided with opportunity for questions.  She verbalized understanding and is in agreement.  Flowsheet Row ED from 09/30/2024 in [facility] Most recent reading at 09/30/2024 10:37 PM ED from 09/30/2024 in [facility] Emergency Department at [facility] Most recent reading at 09/30/2024  7:05 PM ED from 09/13/2024 in [facility] Most recent reading at 09/13/2024  2:56 PM  C-SSRS RISK CATEGORY No Risk No Risk High Risk    Psychiatric Specialty Exam  Presentation  General Appearance:Casual  Eye Contact:Good  Speech:Clear and Coherent  Speech Volume:Normal  Handedness:Right   Mood and Affect  Mood: Euthymic  Affect: Congruent   Thought Process  Thought Processes: Coherent  Descriptions of Associations:Intact  Orientation:Full (Time, Place and Person)  Thought Content:WDL  Diagnosis  of Schizophrenia or Schizoaffective disorder in past: No  Duration of Psychotic Symptoms: Less than six months  Hallucinations:None  Ideas of  Reference:None  Suicidal Thoughts:No With Plan Without Intent; Without Plan  Homicidal Thoughts:No With Plan Without Intent; Without Means to [employer]  Memory: Immediate Fair  Judgment: Poor  Insight: Fair   Art Therapist  Concentration: Good  Attention Span: Good  Recall: Dotti Abe of Knowledge: Good  Language: Good   Psychomotor Activity  Psychomotor Activity: Normal   Assets  Assets: Communication Skills; Desire for Improvement   Sleep  Sleep: Fair  Number of hours:  6   Physical Exam: Physical Exam Constitutional:      General: She is not in acute distress.    Appearance: She is not diaphoretic.  HENT:     Nose: No congestion.  Cardiovascular:     Rate and Rhythm: Normal rate.  Pulmonary:     Effort: No respiratory distress.  Chest:     Chest wall: No tenderness.  Neurological:     Mental Status: She is alert and oriented to person, place, and time.  Psychiatric:        Attention and Perception: Attention and perception normal.        Mood and Affect: Mood and affect normal.        Speech: Speech normal.        Behavior: Behavior is cooperative.        Thought Content: Thought content normal.    Review of Systems  Constitutional:  Negative for chills, diaphoresis and fever.  HENT: Negative.    Eyes:  Negative for discharge.  Respiratory:  Negative for cough, shortness of breath and wheezing.   Cardiovascular:  Negative for chest pain and palpitations.  Gastrointestinal:  Negative for diarrhea, nausea and vomiting.  Neurological:  Negative for dizziness and headaches.  Psychiatric/Behavioral: Negative.     Blood pressure (!) 140/88, temperature 98.6 F (37 C), temperature source Oral, resp. rate 18, SpO2 100%. There is no height or weight on file to calculate BMI.  Musculoskeletal: Strength & Muscle Tone: within normal limits Gait & Station: normal Patient leans: N/A   [facility] MSE Discharge  Disposition for Follow up and Recommendations: Based on my evaluation the patient does not appear to have an emergency medical condition and can be discharged with resources and follow up care in outpatient services for Medication Management and Individual Therapy  Patient denies SI/HI/AVH and paranoia.  Patient does not meet inpatient psychiatric admission criteria or IVC criteria at this time.  There is no evidence of imminent risk of harm to self or others.  Recommend discharge and follow-up with outpatient psychiatric and PCP providers. Recommend continuation of blood pressure medication nifedipine  for hypertension as prescribed. Recommend continuation of psychotropic medication Abilify  for mood stabilization.  Discharge recommendations:  Patient is to take medications as prescribed. Please follow up with your primary care provider for all medical related needs.   Therapy: We recommend that patient participate in individual therapy to address mental health concerns.  Medications: The patient or guardian is to contact a medical professional and/or outpatient provider to address any new side effects that develop. The patient or guardian should update outpatient providers of any new medications and/or medication changes.   Atypical antipsychotics: If you are prescribed an atypical antipsychotic, it is recommended that your height, weight, BMI, blood pressure, fasting lipid panel, and fasting blood sugar be monitored by your outpatient providers.  Safety:  The patient should abstain from use of illicit substances/drugs and abuse of any medications. If symptoms worsen or do not continue to improve or if the patient becomes actively suicidal or homicidal then it is recommended that the patient return to the closest hospital emergency department, the [facility], or call 911 for further evaluation and treatment. National Suicide Prevention Lifeline 1-800-SUICIDE or  346-814-3496.  About 988 988 offers 24/7 access to trained crisis counselors who can help people experiencing mental health-related distress. People can call or text 988 or chat 988lifeline.org for themselves or if they are worried about a loved one who may need crisis support.  Crisis Mobile: [facility]:                     4033310906 (for crisis response 24 hours a day) [facility] Hotline:                                            (714) 849-3663   Patient discharged in stable condition.  Thurman LULLA Ivans, NP 09/30/2024, 11:48 PM

## 2024-09-30 NOTE — Discharge Instructions (Signed)
  Discharge recommendations:  Patient is to take medications as prescribed. Please follow up with your primary care provider for all medical related needs.   Therapy: We recommend that patient participate in individual therapy to address mental health concerns.  Medications: The patient or guardian is to contact a medical professional and/or outpatient provider to address any new side effects that develop. The patient or guardian should update outpatient providers of any new medications and/or medication changes.   Atypical antipsychotics: If you are prescribed an atypical antipsychotic, it is recommended that your height, weight, BMI, blood pressure, fasting lipid panel, and fasting blood sugar be monitored by your outpatient providers.  Safety:  The patient should abstain from use of illicit substances/drugs and abuse of any medications. If symptoms worsen or do not continue to improve or if the patient becomes actively suicidal or homicidal then it is recommended that the patient return to the closest hospital emergency department, the [facility], or call 911 for further evaluation and treatment. National Suicide Prevention Lifeline 1-800-SUICIDE or (226)426-6522.  About 988 988 offers 24/7 access to trained crisis counselors who can help people experiencing mental health-related distress. People can call or text 988 or chat 988lifeline.org for themselves or if they are worried about a loved one who may need crisis support.  Crisis Mobile: [facility]:                     (615)212-2104 (for crisis response 24 hours a day) [facility] Hotline:                                            620-565-7939

## 2024-09-30 NOTE — ED Notes (Signed)
 Pt provided water.

## 2024-09-30 NOTE — ED Notes (Signed)
 Pt offered BP medication per provider's orders.  Pt refused stated I want a cup of water  and need to calm down. Provider made aware
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
## 2024-09-30 NOTE — ED Provider Triage Note (Signed)
 Emergency Medicine Provider Triage Evaluation Note  Melinda Jimenez , a 38 y.o. female  was evaluated in triage.  Pt complains of left sided headache for several days. Also reports vaginal itching. Recently treated for yeast infection. No history of migraines. Patient is bipolar, recently left a treatment facility in [place].  Review of Systems  Positive: Headache, vaginal itching, nausea, vomiting Negative: Abdominal pain, visual disturbance  Physical Exam  BP (!) 171/103   Pulse 100   Temp 98.4 F (36.9 C)   Resp 18   SpO2 100%  Gen:   Awake, no distress   Resp:  Normal effort  MSK:   Moves extremities without difficulty  Other:    Medical Decision Making  Medically screening exam initiated at 7:59 PM.  Appropriate orders placed.  Melinda Jimenez was informed that the remainder of the evaluation will be completed by another provider, this initial triage assessment does not replace that evaluation, and the importance of remaining in the ED until their evaluation is complete.     Sharps Lenis, NP 09/30/24 2004

## 2024-09-30 NOTE — ED Triage Notes (Signed)
 PT is here for headache for week and worsening 3 hours ago. Nausea and vomiting. Head is throbbing. She was brought in by [facility] and [facility] team member but does not feel like this is related to her mental health disorders. SHe is unhoused.

## 2024-10-01 ENCOUNTER — Other Ambulatory Visit: Payer: Self-pay

## 2024-10-01 ENCOUNTER — Ambulatory Visit (HOSPITAL_COMMUNITY)
Admission: EM | Admit: 2024-10-01 | Discharge: 2024-10-01 | Disposition: A | Discharge status: Home / Self Care | Payer: MEDICAID | Attending: Nurse Practitioner | Admitting: Nurse Practitioner

## 2024-10-01 DIAGNOSIS — Z765 Malingerer [conscious simulation]: Secondary | ICD-10-CM | POA: Insufficient documentation

## 2024-10-01 DIAGNOSIS — F319 Bipolar disorder, unspecified: Secondary | ICD-10-CM | POA: Insufficient documentation

## 2024-10-01 DIAGNOSIS — F419 Anxiety disorder, unspecified: Secondary | ICD-10-CM | POA: Insufficient documentation

## 2024-10-01 DIAGNOSIS — R45851 Suicidal ideations: Secondary | ICD-10-CM | POA: Insufficient documentation

## 2024-10-01 DIAGNOSIS — F603 Borderline personality disorder: Secondary | ICD-10-CM | POA: Insufficient documentation

## 2024-10-01 DIAGNOSIS — F4310 Post-traumatic stress disorder, unspecified: Secondary | ICD-10-CM | POA: Insufficient documentation

## 2024-10-01 DIAGNOSIS — Z5900 Homelessness unspecified: Secondary | ICD-10-CM | POA: Insufficient documentation

## 2024-10-01 DIAGNOSIS — Z79899 Other long term (current) drug therapy: Secondary | ICD-10-CM | POA: Insufficient documentation

## 2024-10-01 DIAGNOSIS — I10 Essential (primary) hypertension: Secondary | ICD-10-CM | POA: Insufficient documentation

## 2024-10-01 LAB — POCT URINE DRUG SCREEN - MANUAL ENTRY (I-SCREEN)
POC Amphetamine UR: NOT DETECTED
POC Buprenorphine (BUP): NOT DETECTED
POC Cocaine UR: NOT DETECTED
POC Marijuana UR: NOT DETECTED
POC Methadone UR: NOT DETECTED
POC Methamphetamine UR: NOT DETECTED
POC Morphine: NOT DETECTED
POC Oxazepam (BZO): NOT DETECTED
POC Oxycodone UR: NOT DETECTED
POC Secobarbital (BAR): NOT DETECTED

## 2024-10-01 LAB — POC URINE PREG, ED: Preg Test, Ur: NEGATIVE

## 2024-10-01 MED ORDER — DIPHENHYDRAMINE HCL 50 MG PO CAPS: 50.0000 mg | ORAL_CAPSULE | Freq: Three times a day (TID) | ORAL | Status: DC | PRN

## 2024-10-01 MED ORDER — HALOPERIDOL LACTATE 5 MG/ML IJ SOLN: 10.0000 mg | Freq: Three times a day (TID) | INTRAMUSCULAR | Status: DC | PRN

## 2024-10-01 MED ORDER — NIFEDIPINE ER OSMOTIC RELEASE 30 MG PO TB24
30.0000 mg | ORAL_TABLET | Freq: Every day | ORAL | Status: DC
  Administered 2024-10-01: 30 mg via ORAL
  Filled 2024-10-01: qty 1

## 2024-10-01 MED ORDER — HALOPERIDOL 5 MG PO TABS: 5.0000 mg | ORAL_TABLET | Freq: Three times a day (TID) | ORAL | Status: DC | PRN

## 2024-10-01 MED ORDER — DIPHENHYDRAMINE HCL 50 MG/ML IJ SOLN: 50.0000 mg | Freq: Three times a day (TID) | INTRAMUSCULAR | Status: DC | PRN

## 2024-10-01 MED ORDER — ACETAMINOPHEN 325 MG PO TABS
650.0000 mg | ORAL_TABLET | Freq: Four times a day (QID) | ORAL | Status: DC | PRN
  Administered 2024-10-01: 650 mg via ORAL
  Filled 2024-10-01: qty 2

## 2024-10-01 MED ORDER — TRAZODONE HCL 50 MG PO TABS: 50.0000 mg | ORAL_TABLET | Freq: Every evening | ORAL | Status: DC | PRN

## 2024-10-01 MED ORDER — HYDROXYZINE HCL 25 MG PO TABS: 25.0000 mg | ORAL_TABLET | Freq: Three times a day (TID) | ORAL | Status: DC | PRN

## 2024-10-01 MED ORDER — MAGNESIUM HYDROXIDE 400 MG/5ML PO SUSP: 30.0000 mL | Freq: Every day | ORAL | Status: DC | PRN

## 2024-10-01 MED ORDER — HALOPERIDOL LACTATE 5 MG/ML IJ SOLN: 5.0000 mg | Freq: Three times a day (TID) | INTRAMUSCULAR | Status: DC | PRN

## 2024-10-01 MED ORDER — LORAZEPAM 2 MG/ML IJ SOLN: 2.0000 mg | Freq: Three times a day (TID) | INTRAMUSCULAR | Status: DC | PRN

## 2024-10-01 MED ORDER — ALUM & MAG HYDROXIDE-SIMETH 200-200-20 MG/5ML PO SUSP: 30.0000 mL | ORAL | Status: DC | PRN

## 2024-10-01 MED ORDER — ARIPIPRAZOLE 15 MG PO TABS
15.0000 mg | ORAL_TABLET | Freq: Every day | ORAL | Status: DC
  Administered 2024-10-01: 15 mg via ORAL
  Filled 2024-10-01: qty 1

## 2024-10-01 NOTE — ED Notes (Signed)
 Pt is currently sleeping, no distress noted, environmental check complete, will continue to monitor patient for safety.
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
## 2024-10-01 NOTE — ED Provider Notes (Signed)
 [facility] Continuous Assessment Admission H&P  Date: 10/01/2024 Patient Name: Melinda Jimenez MRN: [id number] Chief Complaint:  I don't have nowhere to go.   Diagnoses:  Final diagnoses:  Homelessness  Malingering  Suicidal ideations    HPI: Melinda Jimenez is a 39 y.o. female.  With psychiatric history of bipolar 1 disorder, borderline personality disorder, PTSD, anxiety, panic attack and medical history of chronic hypertension, who initially presented voluntarily as a walk-in to [facility] with complaints of headache and constantly feeling that people are bothering her.   Patient was evaluated, did not meet admission criteria and discharged with recommendations.  Per front desk staff, patient never left the facility after discharge and was observed in the waiting area using her cell phone, confirming earlier concern for malingering and seeking hospitalization to meet her need for housing.  Patient is now reporting she is going to run out in front of a car because she can't find anyone that will let her stay with them and its cold outside. She is endorsing homelessness.   Patient was seen face to face by this provider and chart reviewed.  She reports I tried to call everybody and nobody would help me and I'm thinking of killing myself. I plan to run out in front of a car, overdose on pills or lay on the road.  Patient endorses homelessness for a long time which she describes as since October or November.  She reports paying to stay at different [employer] and has recently been placed on the Do Not Rent List. She declines to reports why she is on that list, but is interested to know if a SW would find her a place to stay at a shelter.   Patient reports sitting outside for a while tonight before deciding to present to the [facility] earlier.  On evaluation, patient is alert, oriented x 3, and cooperative. Speech is clear and coherent.. Pt appears appropriately dressed. Eye contact is good.  Mood is depressed, affect is congruent with mood. Thought process is coherent and thought content is WDL. Pt endorses conditional SI with plan, denies HI/AVH. There is no objective indication that the patient is responding to internal stimuli. No delusions elicited during this assessment.   Discussed recommendation for admission to the continuous observation unit for safety monitoring overnight and reeval in the a.m.   Discussed recommendation for collaboration with social worker to address psychosocial needs.   Patient is provided with opportunity for questions.  She verbalized her understanding and is in agreement.   Total Time spent with patient: 20 minutes  Musculoskeletal  Strength & Muscle Tone: within normal limits Gait & Station: normal Patient leans: N/A  Psychiatric Specialty Exam  Presentation General Appearance:  Appropriate for Environment  Eye Contact: Good  Speech: Clear and Coherent  Speech Volume: Normal  Handedness: Right   Mood and Affect  Mood: Depressed  Affect: Congruent   Thought Process  Thought Processes: Coherent  Descriptions of Associations:Intact  Orientation:Full (Time, Place and Person)  Thought Content:WDL  Diagnosis of Schizophrenia or Schizoaffective disorder in past: No  Duration of Psychotic Symptoms: Less than six months  Hallucinations:Hallucinations: None  Ideas of Reference:None  Suicidal Thoughts:Suicidal Thoughts: Yes, Passive SI Passive Intent and/or Plan: With Plan  Homicidal Thoughts:Homicidal Thoughts: No   Sensorium  Memory: Immediate Good  Judgment: Poor  Insight: Present   Executive Functions  Concentration: Good  Attention Span: Good  Recall: Good  Fund of Knowledge: Good  Language: Good  Psychomotor Activity  Psychomotor Activity: Psychomotor Activity: Normal   Assets  Assets: Communication Skills; Desire for Improvement   Sleep  Sleep: Sleep: Fair   Nutritional  Assessment (For OBS and FBC admissions only) Has the patient had a weight loss or gain of 10 pounds or more in the last 3 months?: No Has the patient had a decrease in food intake/or appetite?: No Does the patient have dental problems?: No Does the patient have eating habits or behaviors that may be indicators of an eating disorder including binging or inducing vomiting?: No Has the patient recently lost weight without trying?: 0 Has the patient been eating poorly because of a decreased appetite?: 0 Malnutrition Screening Tool Score: 0    Physical Exam Constitutional:      General: She is not in acute distress.    Appearance: She is not diaphoretic.  HENT:     Nose: No congestion.  Pulmonary:     Effort: No respiratory distress.  Chest:     Chest wall: No tenderness.  Neurological:     Mental Status: She is alert and oriented to person, place, and time.  Psychiatric:        Attention and Perception: Attention and perception normal.        Mood and Affect: Mood is depressed. Affect is blunt.        Speech: Speech normal.        Behavior: Behavior is cooperative.        Thought Content: Thought content includes suicidal ideation. Thought content includes suicidal plan.    Review of Systems  Constitutional:  Negative for chills, diaphoresis and fever.  HENT:  Negative for congestion.   Eyes:  Negative for discharge.  Respiratory:  Negative for cough, shortness of breath and wheezing.   Cardiovascular:  Negative for chest pain and palpitations.  Gastrointestinal:  Negative for diarrhea, nausea and vomiting.  Neurological: Negative.   Psychiatric/Behavioral:  Positive for suicidal ideas.    Past Psychiatric History: See H & P   Is the patient at risk to self? Yes  Has the patient been a risk to self in the past 6 months? Yes .    Has the patient been a risk to self within the distant past? Yes   Is the patient a risk to others? No   Has the patient been a risk to others in  the past 6 months? No   Has the patient been a risk to others within the distant past? No   Past Medical History: See Chart  Family History: N/A  Social History: N/A  Last Labs:  Admission on 09/30/2024, Discharged on 09/30/2024  Component Date Value Ref Range Status   Sodium 09/30/2024 137  135 - 145 mmol/L Final   Potassium 09/30/2024 3.7  3.5 - 5.1 mmol/L Final   Chloride 09/30/2024 101  98 - 111 mmol/L Final   CO2 09/30/2024 23  22 - 32 mmol/L Final   Glucose, Bld 09/30/2024 105 (H)  70 - 99 mg/dL Final   Glucose reference range applies only to samples taken after fasting for at least 8 hours.   BUN 09/30/2024 13  6 - 20 mg/dL Final   Creatinine, Ser 09/30/2024 0.62  0.44 - 1.00 mg/dL Final   Calcium  09/30/2024 9.3  8.9 - 10.3 mg/dL Final   GFR, Estimated 09/30/2024 >60  >60 mL/min Final   Comment: (NOTE) Calculated using the CKD-EPI Creatinine Equation (2021)    Anion gap 09/30/2024 13  5 -  15 Final   Performed at [facility], [address] [place], [place] [address]   WBC 09/30/2024 12.3 (H)  4.0 - 10.5 K/uL Final   RBC 09/30/2024 4.84  3.87 - 5.11 MIL/uL Final   Hemoglobin 09/30/2024 10.1 (L)  12.0 - 15.0 g/dL Final   HCT 87/68/7974 33.4 (L)  36.0 - 46.0 % Final   MCV 09/30/2024 69.0 (L)  80.0 - 100.0 fL Final   MCH 09/30/2024 20.9 (L)  26.0 - 34.0 pg Final   MCHC 09/30/2024 30.2  30.0 - 36.0 g/dL Final   RDW 87/68/7974 20.6 (H)  11.5 - 15.5 % Final   Platelets 09/30/2024 388  150 - 400 K/uL Final   nRBC 09/30/2024 0.0  0.0 - 0.2 % Final   Neutrophils Relative % 09/30/2024 57  % Final   Neutro Abs 09/30/2024 7.1  1.7 - 7.7 K/uL Final   Lymphocytes Relative 09/30/2024 36  % Final   Lymphs Abs 09/30/2024 4.5 (H)  0.7 - 4.0 K/uL Final   Monocytes Relative 09/30/2024 5  % Final   Monocytes Absolute 09/30/2024 0.6  0.1 - 1.0 K/uL Final   Eosinophils Relative 09/30/2024 1  % Final   Eosinophils Absolute 09/30/2024 0.1  0.0 - 0.5 K/uL Final   Basophils Relative  09/30/2024 1  % Final   Basophils Absolute 09/30/2024 0.1  0.0 - 0.1 K/uL Final   WBC Morphology 09/30/2024 MORPHOLOGY UNREMARKABLE   Final   RBC Morphology 09/30/2024 MORPHOLOGY UNREMARKABLE   Final   Smear Review 09/30/2024 Normal platelet morphology   Final   Immature Granulocytes 09/30/2024 0  % Final   Abs Immature Granulocytes 09/30/2024 0.05  0.00 - 0.07 K/uL Final   Performed at [facility], [address], [place], [place] [address]   Color, Urine 09/30/2024 YELLOW  YELLOW Final   APPearance 09/30/2024 HAZY (A)  CLEAR Final   Specific Gravity, Urine 09/30/2024 1.015  1.005 - 1.030 Final   pH 09/30/2024 6.0  5.0 - 8.0 Final   Glucose, UA 09/30/2024 NEGATIVE  NEGATIVE mg/dL Final   Hgb urine dipstick 09/30/2024 NEGATIVE  NEGATIVE Final   Bilirubin Urine 09/30/2024 NEGATIVE  NEGATIVE Final   Ketones, ur 09/30/2024 NEGATIVE  NEGATIVE mg/dL Final   Protein, ur 87/68/7974 30 (A)  NEGATIVE mg/dL Final   Nitrite 87/68/7974 NEGATIVE  NEGATIVE Final   Leukocytes,Ua 09/30/2024 TRACE (A)  NEGATIVE Final   RBC / HPF 09/30/2024 0-5  0 - 5 RBC/hpf Final   WBC, UA 09/30/2024 0-5  0 - 5 WBC/hpf Final   Bacteria, UA 09/30/2024 RARE (A)  NONE SEEN Final   Squamous Epithelial / HPF 09/30/2024 6-10  0 - 5 /HPF Final   Mucus 09/30/2024 PRESENT   Final   Performed at [facility], [address] [place], [place] [address]  Admission on 09/13/2024, Discharged on 09/15/2024  Component Date Value Ref Range Status   Color, Urine 09/13/2024 YELLOW  YELLOW Final   APPearance 09/13/2024 CLEAR  CLEAR Final   Specific Gravity, Urine 09/13/2024 1.018  1.005 - 1.030 Final   pH 09/13/2024 6.0  5.0 - 8.0 Final   Glucose, UA 09/13/2024 NEGATIVE  NEGATIVE mg/dL Final   Hgb urine dipstick 09/13/2024 NEGATIVE  NEGATIVE Final   Bilirubin Urine 09/13/2024 NEGATIVE  NEGATIVE Final   Ketones, ur 09/13/2024 NEGATIVE  NEGATIVE mg/dL Final   Protein, ur 87/85/7974 NEGATIVE  NEGATIVE mg/dL Final   Nitrite  87/85/7974 NEGATIVE  NEGATIVE Final  Leukocytes,Ua 09/13/2024 NEGATIVE  NEGATIVE Final   Performed at [facility], [address] [place], [place] [address]   POC Amphetamine UR 09/13/2024 None Detected  NONE DETECTED (Cut Off Level 1000 ng/mL) Final   POC Secobarbital (BAR) 09/13/2024 None Detected  NONE DETECTED (Cut Off Level 300 ng/mL) Final   POC Buprenorphine (BUP) 09/13/2024 None Detected  NONE DETECTED (Cut Off Level 10 ng/mL) Final   POC Oxazepam (BZO) 09/13/2024 None Detected  NONE DETECTED (Cut Off Level 300 ng/mL) Final   POC Cocaine UR 09/13/2024 None Detected  NONE DETECTED (Cut Off Level 300 ng/mL) Final   POC Methamphetamine UR 09/13/2024 None Detected  NONE DETECTED (Cut Off Level 1000 ng/mL) Final   POC Morphine  09/13/2024 None Detected  NONE DETECTED (Cut Off Level 300 ng/mL) Final   POC Methadone UR 09/13/2024 None Detected  NONE DETECTED (Cut Off Level 300 ng/mL) Final   POC Oxycodone  UR 09/13/2024 None Detected  NONE DETECTED (Cut Off Level 100 ng/mL) Final   POC Marijuana UR 09/13/2024 None Detected  NONE DETECTED (Cut Off Level 50 ng/mL) Final   Preg Test, Ur 09/13/2024 Negative  Negative Final  Admission on 09/07/2024, Discharged on 09/09/2024  Component Date Value Ref Range Status   WBC 09/07/2024 11.3 (H)  4.0 - 10.5 K/uL Final   RBC 09/07/2024 3.98  3.87 - 5.11 MIL/uL Final   Hemoglobin 09/07/2024 10.3 (L)  12.0 - 15.0 g/dL Final   HCT 87/91/7974 30.4 (L)  36.0 - 46.0 % Final   MCV 09/07/2024 76.4 (L)  80.0 - 100.0 fL Final   MCH 09/07/2024 25.9 (L)  26.0 - 34.0 pg Final   MCHC 09/07/2024 33.9  30.0 - 36.0 g/dL Final   RDW 87/91/7974 21.9 (H)  11.5 - 15.5 % Final   Platelets 09/07/2024 354  150 - 400 K/uL Final   nRBC 09/07/2024 0.0  0.0 - 0.2 % Final   Neutrophils Relative % 09/07/2024 61  % Final   Neutro Abs 09/07/2024 6.8  1.7 - 7.7 K/uL Final   Lymphocytes Relative 09/07/2024 34  % Final   Lymphs Abs 09/07/2024 3.8  0.7 - 4.0 K/uL Final    Monocytes Relative 09/07/2024 5  % Final   Monocytes Absolute 09/07/2024 0.6  0.1 - 1.0 K/uL Final   Eosinophils Relative 09/07/2024 0  % Final   Eosinophils Absolute 09/07/2024 0.1  0.0 - 0.5 K/uL Final   Basophils Relative 09/07/2024 0  % Final   Basophils Absolute 09/07/2024 0.0  0.0 - 0.1 K/uL Final   WBC Morphology 09/07/2024 MORPHOLOGY UNREMARKABLE   Final   RBC Morphology 09/07/2024 MORPHOLOGY UNREMARKABLE   Final   Smear Review 09/07/2024 Normal platelet morphology   Final   Immature Granulocytes 09/07/2024 0  % Final   Abs Immature Granulocytes 09/07/2024 0.02  0.00 - 0.07 K/uL Final   Performed at [facility], [address] [place], [place] [address]   Sodium 09/07/2024 137  135 - 145 mmol/L Final   Potassium 09/07/2024 4.0  3.5 - 5.1 mmol/L Final   Chloride 09/07/2024 101  98 - 111 mmol/L Final   CO2 09/07/2024 31  22 - 32 mmol/L Final   Glucose, Bld 09/07/2024 89  70 - 99 mg/dL Final   Glucose reference range applies only to samples taken after fasting for at least 8 hours.   BUN 09/07/2024 11  6 - 20 mg/dL Final   Creatinine, Ser 09/07/2024 0.73  0.44 - 1.00 mg/dL  Final   Calcium  09/07/2024 8.8 (L)  8.9 - 10.3 mg/dL Final   Total Protein 87/91/7974 7.9  6.5 - 8.1 g/dL Final   Albumin 87/91/7974 3.9  3.5 - 5.0 g/dL Final   AST 87/91/7974 17  15 - 41 U/L Final   ALT 09/07/2024 12  0 - 44 U/L Final   Alkaline Phosphatase 09/07/2024 62  38 - 126 U/L Final   Total Bilirubin 09/07/2024 1.0  0.0 - 1.2 mg/dL Final   GFR, Estimated 09/07/2024 >60  >60 mL/min Final   Comment: (NOTE) Calculated using the CKD-EPI Creatinine Equation (2021)    Anion gap 09/07/2024 5  5 - 15 Final   Performed at [facility], [address] [place], [place] [address]   Alcohol, Ethyl (B) 09/07/2024 <15  <15 mg/dL Final   Comment: (NOTE) For medical purposes only. Performed at [facility], [address], [place], [place] [address]    TSH 09/07/2024 1.082  0.350 - 4.500  uIU/mL Final   Comment: Performed by a 3rd Generation assay with a functional sensitivity of <=0.01 uIU/mL. Performed at [facility], [address] [place], [place] [address]    Preg Test, Ur 09/07/2024 Negative  Negative Final   POC Amphetamine UR 09/07/2024 None Detected  NONE DETECTED (Cut Off Level 1000 ng/mL) Final   POC Secobarbital (BAR) 09/07/2024 None Detected  NONE DETECTED (Cut Off Level 300 ng/mL) Final   POC Buprenorphine (BUP) 09/07/2024 None Detected  NONE DETECTED (Cut Off Level 10 ng/mL) Final   POC Oxazepam (BZO) 09/07/2024 None Detected  NONE DETECTED (Cut Off Level 300 ng/mL) Final   POC Cocaine UR 09/07/2024 None Detected  NONE DETECTED (Cut Off Level 300 ng/mL) Final   POC Methamphetamine UR 09/07/2024 None Detected  NONE DETECTED (Cut Off Level 1000 ng/mL) Final   POC Morphine  09/07/2024 None Detected  NONE DETECTED (Cut Off Level 300 ng/mL) Final   POC Methadone UR 09/07/2024 None Detected  NONE DETECTED (Cut Off Level 300 ng/mL) Final   POC Oxycodone  UR 09/07/2024 None Detected  NONE DETECTED (Cut Off Level 100 ng/mL) Final   POC Marijuana UR 09/07/2024 None Detected  NONE DETECTED (Cut Off Level 50 ng/mL) Final  Admission on 09/02/2024, Discharged on 09/03/2024  Component Date Value Ref Range Status   POC Amphetamine UR 09/02/2024 None Detected  NONE DETECTED (Cut Off Level 1000 ng/mL) Final   POC Secobarbital (BAR) 09/02/2024 None Detected  NONE DETECTED (Cut Off Level 300 ng/mL) Final   POC Buprenorphine (BUP) 09/02/2024 None Detected  NONE DETECTED (Cut Off Level 10 ng/mL) Final   POC Oxazepam (BZO) 09/02/2024 None Detected  NONE DETECTED (Cut Off Level 300 ng/mL) Final   POC Cocaine UR 09/02/2024 None Detected  NONE DETECTED (Cut Off Level 300 ng/mL) Final   POC Methamphetamine UR 09/02/2024 None Detected  NONE DETECTED (Cut Off Level 1000 ng/mL) Final   POC Morphine  09/02/2024 None Detected  NONE DETECTED (Cut Off Level 300 ng/mL) Final   POC Methadone  UR 09/02/2024 None Detected  NONE DETECTED (Cut Off Level 300 ng/mL) Final   POC Oxycodone  UR 09/02/2024 None Detected  NONE DETECTED (Cut Off Level 100 ng/mL) Final   POC Marijuana UR 09/02/2024 None Detected  NONE DETECTED (Cut Off Level 50 ng/mL) Final   Preg Test, Ur 09/02/2024 Negative  Negative Final  Admission on 09/02/2024, Discharged on 09/02/2024  Component Date Value Ref Range Status   SARS Coronavirus 2 by RT PCR  09/02/2024 NEGATIVE  NEGATIVE Final   Influenza A by PCR 09/02/2024 NEGATIVE  NEGATIVE Final   Influenza B by PCR 09/02/2024 NEGATIVE  NEGATIVE Final   Comment: (NOTE) The Xpert Xpress SARS-CoV-2/FLU/RSV plus assay is intended as an aid in the diagnosis of influenza from Nasopharyngeal swab specimens and should not be used as a sole basis for treatment. Nasal washings and aspirates are unacceptable for Xpert Xpress SARS-CoV-2/FLU/RSV testing.  Fact Sheet for Patients: bloggercourse.com  Fact Sheet for Healthcare Providers: seriousbroker.it  This test is not yet approved or cleared by the [place]  FDA and has been authorized for detection and/or diagnosis of SARS-CoV-2 by FDA under an Emergency Use Authorization (EUA). This EUA will remain in effect (meaning this test can be used) for the duration of the COVID-19 declaration under Section 564(b)(1) of the Act, 21 U.S.C. section 360bbb-3(b)(1), unless the authorization is terminated or revoked.     Resp Syncytial Virus by PCR 09/02/2024 NEGATIVE  NEGATIVE Final   Comment: (NOTE) Fact Sheet for Patients: bloggercourse.com  Fact Sheet for Healthcare Providers: seriousbroker.it  This test is not yet approved or cleared by the [place]  FDA and has been authorized for detection and/or diagnosis of SARS-CoV-2 by FDA under an Emergency Use Authorization (EUA). This EUA will remain in effect (meaning this  test can be used) for the duration of the COVID-19 declaration under Section 564(b)(1) of the Act, 21 U.S.C. section 360bbb-3(b)(1), unless the authorization is terminated or revoked.  Performed at [facility], [address], [place], [place] [address]   Admission on 08/12/2024, Discharged on 08/13/2024  Component Date Value Ref Range Status   RPR Ser Ql 08/12/2024 NON REACTIVE  NON REACTIVE Final   Performed at [facility], [address], [place], [place] [address]   Neisseria Gonorrhea 08/12/2024 Negative   Final   Chlamydia 08/12/2024 Negative   Final   Comment 08/12/2024 Normal Reference Ranger Chlamydia - Negative   Final   Comment 08/12/2024 Normal Reference Range Neisseria Gonorrhea - Negative   Final   TSH 08/12/2024 0.595  0.350 - 4.500 uIU/mL Final   Comment: Performed by a 3rd Generation assay with a functional sensitivity of <=0.01 uIU/mL. Performed at [facility], [address] [place], [place] [address]    Cholesterol 08/12/2024 126  0 - 200 mg/dL Final   Triglycerides 88/87/7974 93  <150 mg/dL Final   HDL 88/87/7974 35 (L)  >40 mg/dL Final   Total CHOL/HDL Ratio 08/12/2024 3.6  RATIO Final   VLDL 08/12/2024 19  0 - 40 mg/dL Final   LDL Cholesterol 08/12/2024 72  0 - 99 mg/dL Final   Comment:        Total Cholesterol/HDL:CHD Risk Coronary Heart Disease Risk Table                     Men   Women  1/2 Average Risk   3.4   3.3  Average Risk       5.0   4.4  2 X Average Risk   9.6   7.1  3 X Average Risk  23.4   11.0        Use the calculated Patient Ratio above and the CHD Risk Table to determine the patient's CHD Risk.        ATP III CLASSIFICATION (LDL):  <100     mg/dL   Optimal  899-870  mg/dL   Near or Above  Optimal  130-159  mg/dL   Borderline  839-810  mg/dL   High  >809     mg/dL   Very High Performed at [facility], [address] [place], [place] [address]    Magnesium  08/12/2024 2.0  1.7 - 2.4  mg/dL Final   Performed at [facility], [address], [place], [place] [address]   Hgb A1c MFr Bld 08/12/2024 6.0 (H)  4.8 - 5.6 % Final   Comment: (NOTE) Diagnosis of Diabetes The following HbA1c ranges recommended by the American Diabetes Association (ADA) may be used as an aid in the diagnosis of diabetes mellitus.  Hemoglobin             Suggested A1C NGSP%              Diagnosis  <5.7                   Non Diabetic  5.7-6.4                Pre-Diabetic  >6.4                   Diabetic  <7.0                   Glycemic control for                       adults with diabetes.     Mean Plasma Glucose 08/12/2024 125.5  mg/dL Final   Performed at [facility], [address] [place], [place] [address]   Sodium 08/12/2024 138  135 - 145 mmol/L Final   Potassium 08/12/2024 3.6  3.5 - 5.1 mmol/L Final   Chloride 08/12/2024 102  98 - 111 mmol/L Final   CO2 08/12/2024 24  22 - 32 mmol/L Final   Glucose, Bld 08/12/2024 108 (H)  70 - 99 mg/dL Final   Glucose reference range applies only to samples taken after fasting for at least 8 hours.   BUN 08/12/2024 14  6 - 20 mg/dL Final   Creatinine, Ser 08/12/2024 0.67  0.44 - 1.00 mg/dL Final   Calcium  08/12/2024 9.4  8.9 - 10.3 mg/dL Final   Total Protein 88/87/7974 8.9 (H)  6.5 - 8.1 g/dL Final   Albumin 88/87/7974 3.9  3.5 - 5.0 g/dL Final   AST 88/87/7974 16  15 - 41 U/L Final   ALT 08/12/2024 15  0 - 44 U/L Final   Alkaline Phosphatase 08/12/2024 63  38 - 126 U/L Final   Total Bilirubin 08/12/2024 0.6  0.0 - 1.2 mg/dL Final   GFR, Estimated 08/12/2024 >60  >60 mL/min Final   Comment: (NOTE) Calculated using the CKD-EPI Creatinine Equation (2021)    Anion gap 08/12/2024 12  5 - 15 Final   Performed at [facility], [address] [place], [place] [address]   WBC 08/12/2024 12.2 (H)  4.0 - 10.5 K/uL Final   RBC 08/12/2024 5.25 (H)  3.87 - 5.11 MIL/uL Final   Hemoglobin 08/12/2024 10.6 (L)  12.0 - 15.0 g/dL  Final   HCT 88/87/7974 35.8 (L)  36.0 - 46.0 % Final   MCV 08/12/2024 68.2 (L)  80.0 - 100.0 fL Final   MCH 08/12/2024 20.2 (L)  26.0 - 34.0 pg Final   MCHC 08/12/2024 29.6 (L)  30.0 - 36.0 g/dL Final   RDW 88/87/7974 20.2 (H)  11.5 - 15.5 %  Final   Platelets 08/12/2024 405 (H)  150 - 400 K/uL Final   nRBC 08/12/2024 0.0  0.0 - 0.2 % Final   Neutrophils Relative % 08/12/2024 53  % Final   Neutro Abs 08/12/2024 6.3  1.7 - 7.7 K/uL Final   Lymphocytes Relative 08/12/2024 42  % Final   Lymphs Abs 08/12/2024 5.2 (H)  0.7 - 4.0 K/uL Final   Monocytes Relative 08/12/2024 4  % Final   Monocytes Absolute 08/12/2024 0.5  0.1 - 1.0 K/uL Final   Eosinophils Relative 08/12/2024 1  % Final   Eosinophils Absolute 08/12/2024 0.1  0.0 - 0.5 K/uL Final   Basophils Relative 08/12/2024 0  % Final   Basophils Absolute 08/12/2024 0.0  0.0 - 0.1 K/uL Final   WBC Morphology 08/12/2024 MORPHOLOGY UNREMARKABLE   Final   Smear Review 08/12/2024 Normal platelet morphology   Final   Immature Granulocytes 08/12/2024 0  % Final   Abs Immature Granulocytes 08/12/2024 0.03  0.00 - 0.07 K/uL Final   Polychromasia 08/12/2024 PRESENT   Final   Performed at [facility], [address] [place], [place] [address]   Hepatitis B Surface Ag 08/12/2024 NON REACTIVE  NON REACTIVE Final   HCV Ab 08/12/2024 NON REACTIVE  NON REACTIVE Final   Comment: (NOTE) Nonreactive HCV antibody screen is consistent with no HCV infections,  unless recent infection is suspected or other evidence exists to indicate HCV infection.     Hep A IgM 08/12/2024 NON REACTIVE  NON REACTIVE Final   Hep B C IgM 08/12/2024 NON REACTIVE  NON REACTIVE Final   Performed at [facility], [address] [place], [place] [address]   Preg Test, Ur 08/12/2024 Negative  Negative Final   POC Amphetamine UR 08/12/2024 None Detected  NONE DETECTED (Cut Off Level 1000 ng/mL) Final   POC Secobarbital (BAR) 08/12/2024 None Detected  NONE DETECTED (Cut  Off Level 300 ng/mL) Final   POC Buprenorphine (BUP) 08/12/2024 None Detected  NONE DETECTED (Cut Off Level 10 ng/mL) Final   POC Oxazepam (BZO) 08/12/2024 None Detected  NONE DETECTED (Cut Off Level 300 ng/mL) Final   POC Cocaine UR 08/12/2024 None Detected  NONE DETECTED (Cut Off Level 300 ng/mL) Final   POC Methamphetamine UR 08/12/2024 None Detected  NONE DETECTED (Cut Off Level 1000 ng/mL) Final   POC Morphine  08/12/2024 None Detected  NONE DETECTED (Cut Off Level 300 ng/mL) Final   POC Methadone UR 08/12/2024 None Detected  NONE DETECTED (Cut Off Level 300 ng/mL) Final   POC Oxycodone  UR 08/12/2024 None Detected  NONE DETECTED (Cut Off Level 100 ng/mL) Final   POC Marijuana UR 08/12/2024 None Detected  NONE DETECTED (Cut Off Level 50 ng/mL) Final  Admission on 07/28/2024, Discharged on 07/31/2024  Component Date Value Ref Range Status   Cholesterol 07/29/2024 116  0 - 200 mg/dL Final   Comment:        ATP III CLASSIFICATION:  <200     mg/dL   Desirable  799-760  mg/dL   Borderline High  >=759    mg/dL   High           Triglycerides 07/29/2024 56  <150 mg/dL Final   HDL 89/70/7974 33 (L)  >40 mg/dL Final   Total CHOL/HDL Ratio 07/29/2024 3.5  RATIO Final   VLDL 07/29/2024 11  0 - 40 mg/dL Final   LDL Cholesterol 07/29/2024 71  0 - 99 mg/dL Final   Comment:  Total Cholesterol/HDL:CHD Risk Coronary Heart Disease Risk Table                     Men   Women  1/2 Average Risk   3.4   3.3  Average Risk       5.0   4.4  2 X Average Risk   9.6   7.1  3 X Average Risk  23.4   11.0        Use the calculated Patient Ratio above and the CHD Risk Table to determine the patient's CHD Risk.        ATP III CLASSIFICATION (LDL):  <100     mg/dL   Optimal  899-870  mg/dL   Near or Above                    Optimal  130-159  mg/dL   Borderline  839-810  mg/dL   High  >809     mg/dL   Very High Performed at [facility], [address] [place], [place] [address]     Vit D, 25-Hydroxy 07/30/2024 11.48 (L)  30 - 100 ng/mL Final   Comment: (NOTE) Vitamin D  deficiency has been defined by the Institute of Medicine  and an Endocrine Society practice guideline as a level of serum 25-OH  vitamin D  less than 20 ng/mL (1,2). The Endocrine Society went on to  further define vitamin D  insufficiency as a level between 21 and 29  ng/mL (2).  1. IOM (Institute of Medicine). 2010. Dietary reference intakes for  calcium  and D. [place]: [employer]. 2. Holick MF, Binkley Ajo, Bischoff-Ferrari HA, et al. Evaluation,  treatment, and prevention of vitamin D  deficiency: an Endocrine  Society clinical practice guideline, JCEM. 2011 Jul; 96(7): 1911-30.  Performed at [facility], [address], [place], [place] [address]   Admission on 07/27/2024, Discharged on 07/28/2024  Component Date Value Ref Range Status   WBC 07/28/2024 10.4  4.0 - 10.5 K/uL Final   RBC 07/28/2024 4.98  3.87 - 5.11 MIL/uL Final   Hemoglobin 07/28/2024 10.1 (L)  12.0 - 15.0 g/dL Final   HCT 89/71/7974 34.4 (L)  36.0 - 46.0 % Final   MCV 07/28/2024 69.1 (L)  80.0 - 100.0 fL Final   MCH 07/28/2024 20.3 (L)  26.0 - 34.0 pg Final   MCHC 07/28/2024 29.4 (L)  30.0 - 36.0 g/dL Final   RDW 89/71/7974 19.6 (H)  11.5 - 15.5 % Final   Platelets 07/28/2024 389  150 - 400 K/uL Final   REPEATED TO VERIFY   nRBC 07/28/2024 0.0  0.0 - 0.2 % Final   Neutrophils Relative % 07/28/2024 50  % Final   Neutro Abs 07/28/2024 5.2  1.7 - 7.7 K/uL Final   Lymphocytes Relative 07/28/2024 42  % Final   Lymphs Abs 07/28/2024 4.3 (H)  0.7 - 4.0 K/uL Final   Monocytes Relative 07/28/2024 6  % Final   Monocytes Absolute 07/28/2024 0.6  0.1 - 1.0 K/uL Final   Eosinophils Relative 07/28/2024 1  % Final   Eosinophils Absolute 07/28/2024 0.1  0.0 - 0.5 K/uL Final   Basophils Relative 07/28/2024 1  % Final   Basophils Absolute 07/28/2024 0.1  0.0 - 0.1 K/uL Final   WBC Morphology 07/28/2024  MORPHOLOGY UNREMARKABLE   Final   RBC Morphology 07/28/2024 MORPHOLOGY UNREMARKABLE   Final   Smear Review 07/28/2024 Normal platelet morphology  Final   Immature Granulocytes 07/28/2024 0  % Final   Abs Immature Granulocytes 07/28/2024 0.04  0.00 - 0.07 K/uL Final   Performed at [facility], [address] [place], [place] [address]   Sodium 07/28/2024 137  135 - 145 mmol/L Final   Potassium 07/28/2024 4.2  3.5 - 5.1 mmol/L Final   Chloride 07/28/2024 101  98 - 111 mmol/L Final   CO2 07/28/2024 25  22 - 32 mmol/L Final   Glucose, Bld 07/28/2024 95  70 - 99 mg/dL Final   Glucose reference range applies only to samples taken after fasting for at least 8 hours.   BUN 07/28/2024 13  6 - 20 mg/dL Final   Creatinine, Ser 07/28/2024 0.75  0.44 - 1.00 mg/dL Final   Calcium  07/28/2024 9.4  8.9 - 10.3 mg/dL Final   Total Protein 89/71/7974 8.0  6.5 - 8.1 g/dL Final   Albumin 89/71/7974 3.9  3.5 - 5.0 g/dL Final   AST 89/71/7974 15  15 - 41 U/L Final   ALT 07/28/2024 11  0 - 44 U/L Final   Alkaline Phosphatase 07/28/2024 65  38 - 126 U/L Final   Total Bilirubin 07/28/2024 0.7  0.0 - 1.2 mg/dL Final   GFR, Estimated 07/28/2024 >60  >60 mL/min Final   Comment: (NOTE) Calculated using the CKD-EPI Creatinine Equation (2021)    Anion gap 07/28/2024 11  5 - 15 Final   Performed at [facility], [address], [place], [place] [address]   Hgb A1c MFr Bld 07/28/2024 5.9 (H)  4.8 - 5.6 % Final   Comment: (NOTE) Diagnosis of Diabetes The following HbA1c ranges recommended by the American Diabetes Association (ADA) may be used as an aid in the diagnosis of diabetes mellitus.  Hemoglobin             Suggested A1C NGSP%              Diagnosis  <5.7                   Non Diabetic  5.7-6.4                Pre-Diabetic  >6.4                   Diabetic  <7.0                   Glycemic control for                       adults with diabetes.     Mean Plasma Glucose 07/28/2024 122.63   mg/dL Final   Performed at [facility], [address] [place], [place] [address]   Preg Test, Ur 07/27/2024 Negative  Negative Final   POC Amphetamine UR 07/27/2024 None Detected  NONE DETECTED (Cut Off Level 1000 ng/mL) Final   POC Secobarbital (BAR) 07/27/2024 None Detected  NONE DETECTED (Cut Off Level 300 ng/mL) Final   POC Buprenorphine (BUP) 07/27/2024 None Detected  NONE DETECTED (Cut Off Level 10 ng/mL) Final   POC Oxazepam (BZO) 07/27/2024 None Detected  NONE DETECTED (Cut Off Level 300 ng/mL) Final   POC Cocaine UR 07/27/2024 None Detected  NONE DETECTED (Cut Off Level 300 ng/mL) Final   POC Methamphetamine UR 07/27/2024 None Detected  NONE DETECTED (Cut Off Level 1000 ng/mL) Final   POC Morphine  07/27/2024 None Detected  NONE DETECTED (Cut Off Level  300 ng/mL) Final   POC Methadone UR 07/27/2024 None Detected  NONE DETECTED (Cut Off Level 300 ng/mL) Final   POC Oxycodone  UR 07/27/2024 None Detected  NONE DETECTED (Cut Off Level 100 ng/mL) Final   POC Marijuana UR 07/27/2024 None Detected  NONE DETECTED (Cut Off Level 50 ng/mL) Final  Admission on 06/22/2024, Discharged on 06/23/2024  Component Date Value Ref Range Status   WBC 06/22/2024 10.4  4.0 - 10.5 K/uL Final   RBC 06/22/2024 4.93  3.87 - 5.11 MIL/uL Final   Hemoglobin 06/22/2024 10.1 (L)  12.0 - 15.0 g/dL Final   HCT 90/77/7974 34.2 (L)  36.0 - 46.0 % Final   MCV 06/22/2024 69.4 (L)  80.0 - 100.0 fL Final   MCH 06/22/2024 20.5 (L)  26.0 - 34.0 pg Final   MCHC 06/22/2024 29.5 (L)  30.0 - 36.0 g/dL Final   RDW 90/77/7974 19.3 (H)  11.5 - 15.5 % Final   Platelets 06/22/2024 377  150 - 400 K/uL Final   REPEATED TO VERIFY   nRBC 06/22/2024 0.0  0.0 - 0.2 % Final   Neutrophils Relative % 06/22/2024 52  % Final   Neutro Abs 06/22/2024 5.4  1.7 - 7.7 K/uL Final   Lymphocytes Relative 06/22/2024 40  % Final   Lymphs Abs 06/22/2024 4.2 (H)  0.7 - 4.0 K/uL Final   Monocytes Relative 06/22/2024 6  % Final   Monocytes  Absolute 06/22/2024 0.6  0.1 - 1.0 K/uL Final   Eosinophils Relative 06/22/2024 1  % Final   Eosinophils Absolute 06/22/2024 0.1  0.0 - 0.5 K/uL Final   Basophils Relative 06/22/2024 1  % Final   Basophils Absolute 06/22/2024 0.1  0.0 - 0.1 K/uL Final   Immature Granulocytes 06/22/2024 0  % Final   Abs Immature Granulocytes 06/22/2024 0.02  0.00 - 0.07 K/uL Final   Performed at [facility], [address], [place], [place] [address]   Sodium 06/22/2024 135  135 - 145 mmol/L Final   Potassium 06/22/2024 3.2 (L)  3.5 - 5.1 mmol/L Final   Chloride 06/22/2024 100  98 - 111 mmol/L Final   CO2 06/22/2024 23  22 - 32 mmol/L Final   Glucose, Bld 06/22/2024 148 (H)  70 - 99 mg/dL Final   Glucose reference range applies only to samples taken after fasting for at least 8 hours.   BUN 06/22/2024 13  6 - 20 mg/dL Final   Creatinine, Ser 06/22/2024 0.74  0.44 - 1.00 mg/dL Final   Calcium  06/22/2024 9.1  8.9 - 10.3 mg/dL Final   Total Protein 90/77/7974 7.9  6.5 - 8.1 g/dL Final   Albumin 90/77/7974 3.5  3.5 - 5.0 g/dL Final   AST 90/77/7974 16  15 - 41 U/L Final   ALT 06/22/2024 9  0 - 44 U/L Final   Alkaline Phosphatase 06/22/2024 61  38 - 126 U/L Final   Total Bilirubin 06/22/2024 0.8  0.0 - 1.2 mg/dL Final   GFR, Estimated 06/22/2024 >60  >60 mL/min Final   Comment: (NOTE) Calculated using the CKD-EPI Creatinine Equation (2021)    Anion gap 06/22/2024 12  5 - 15 Final   Performed at [facility], [address], [place], [place] [address]   Color, Urine 06/22/2024 YELLOW  YELLOW Final   APPearance 06/22/2024 HAZY (A)  CLEAR Final   Specific Gravity, Urine 06/22/2024 1.019  1.005 - 1.030 Final   pH 06/22/2024 5.0  5.0 - 8.0 Final  Glucose, UA 06/22/2024 NEGATIVE  NEGATIVE mg/dL Final   Hgb urine dipstick 06/22/2024 LARGE (A)  NEGATIVE Final   Bilirubin Urine 06/22/2024 NEGATIVE  NEGATIVE Final   Ketones, ur 06/22/2024 NEGATIVE  NEGATIVE mg/dL Final   Protein, ur 90/77/7974 30  (A)  NEGATIVE mg/dL Final   Nitrite 90/77/7974 NEGATIVE  NEGATIVE Final   Leukocytes,Ua 06/22/2024 SMALL (A)  NEGATIVE Final   RBC / HPF 06/22/2024 >50  0 - 5 RBC/hpf Final   WBC, UA 06/22/2024 6-10  0 - 5 WBC/hpf Final   Bacteria, UA 06/22/2024 FEW (A)  NONE SEEN Final   Squamous Epithelial / HPF 06/22/2024 0-5  0 - 5 /HPF Final   Mucus 06/22/2024 PRESENT   Final   Performed at [facility], [address], [place], [place] [address]   hCG, Beta Chain, Quant, S 06/22/2024 <1  <5 mIU/mL Final   Comment:          GEST. AGE      CONC.  (mIU/mL)   <=1 WEEK        5 - 50     2 WEEKS       50 - 500     3 WEEKS       100 - 10,000     4 WEEKS     1,000 - 30,000     5 WEEKS     3,500 - 115,000   6-8 WEEKS     12,000 - 270,000    12 WEEKS     15,000 - 220,000        FEMALE AND NON-PREGNANT FEMALE:     LESS THAN 5 mIU/mL Performed at [facility], [address] [place], [place] [address]    Lipase 06/22/2024 20  11 - 51 U/L Final   Performed at [facility], [address], [place], [place] [address]   Specimen Description 06/23/2024 URINE, CLEAN CATCH   Final   Special Requests 06/23/2024    Final                   Value:NONE Performed at [facility], [address] [place], [place] [address]    Culture 06/23/2024 MULTIPLE SPECIES PRESENT, SUGGEST RECOLLECTION (A)   Final   Report Status 06/23/2024 06/23/2024 FINAL   Final   Yeast Wet Prep HPF POC 06/23/2024 NONE SEEN  NONE SEEN Final   Trich, Wet Prep 06/23/2024 NONE SEEN  NONE SEEN Final   Clue Cells Wet Prep HPF POC 06/23/2024 NONE SEEN  NONE SEEN Final   WBC, Wet Prep HPF POC 06/23/2024 <10  <10 Final   Sperm 06/23/2024 NONE SEEN   Final   Performed at [facility], [address] [place], [place] [address]   Neisseria Gonorrhea 06/23/2024 Negative   Final   Chlamydia 06/23/2024 Negative   Final   Comment 06/23/2024 Normal Reference Ranger Chlamydia - Negative   Final   Comment  06/23/2024 Normal Reference Range Neisseria Gonorrhea - Negative   Final  Admission on 05/31/2024, Discharged on 05/31/2024  Component Date Value Ref Range Status   Specimen Source 05/31/2024 URINE, CLEAN CATCH   Final   Color, Urine 05/31/2024 YELLOW  YELLOW Final   APPearance 05/31/2024 CLEAR  CLEAR Final   Specific Gravity, Urine 05/31/2024 1.017  1.005 - 1.030 Final   pH 05/31/2024 5.0  5.0 - 8.0 Final   Glucose, UA 05/31/2024 NEGATIVE  NEGATIVE mg/dL Final  Hgb urine dipstick 05/31/2024 NEGATIVE  NEGATIVE Final   Bilirubin Urine 05/31/2024 NEGATIVE  NEGATIVE Final   Ketones, ur 05/31/2024 NEGATIVE  NEGATIVE mg/dL Final   Protein, ur 91/68/7974 NEGATIVE  NEGATIVE mg/dL Final   Nitrite 91/68/7974 NEGATIVE  NEGATIVE Final   Leukocytes,Ua 05/31/2024 NEGATIVE  NEGATIVE Final   RBC / HPF 05/31/2024 0-5  0 - 5 RBC/hpf Final   WBC, UA 05/31/2024 0-5  0 - 5 WBC/hpf Final   Comment:        Reflex urine culture not performed if WBC <=10, OR if Squamous epithelial cells >5. If Squamous epithelial cells >5 suggest recollection.    Bacteria, UA 05/31/2024 RARE (A)  NONE SEEN Final   Squamous Epithelial / HPF 05/31/2024 0-5  0 - 5 /HPF Final   Mucus 05/31/2024 PRESENT   Final   Performed at [facility], [address] [place], [place] [address]  There may be more visits with results that are not included.    Allergies: Latex, Zithromax  [azithromycin ], and Zofran  [ondansetron ]  Medications:  Facility Ordered Medications  Medication   acetaminophen  (TYLENOL ) tablet 650 mg   alum & mag hydroxide-simeth (MAALOX/MYLANTA) 200-200-20 MG/5ML suspension 30 mL   magnesium  hydroxide (MILK OF MAGNESIA) suspension 30 mL   haloperidol  (HALDOL ) tablet 5 mg   And   diphenhydrAMINE  (BENADRYL ) capsule 50 mg   haloperidol  lactate (HALDOL ) injection 5 mg   And   diphenhydrAMINE  (BENADRYL ) injection 50 mg   And   LORazepam  (ATIVAN ) injection 2 mg   haloperidol  lactate (HALDOL ) injection 10 mg    And   diphenhydrAMINE  (BENADRYL ) injection 50 mg   And   LORazepam  (ATIVAN ) injection 2 mg   hydrOXYzine  (ATARAX ) tablet 25 mg   traZODone  (DESYREL ) tablet 50 mg   ARIPiprazole  (ABILIFY ) tablet 15 mg   NIFEdipine  (PROCARDIA -XL/NIFEDICAL-XL) 24 hr tablet 30 mg   PTA Medications  Medication Sig   hydrOXYzine  (ATARAX ) 25 MG tablet Take 1 tablet (25 mg total) by mouth every 8 (eight) hours as needed for anxiety. (Patient not taking: Reported on 09/13/2024)   albuterol  (VENTOLIN  HFA) 108 (90 Base) MCG/ACT inhaler Inhale 2 puffs into the lungs every 4 (four) hours as needed for wheezing or shortness of breath.   Vitamin D , Ergocalciferol , (DRISDOL ) 1.25 MG (50000 UNIT) CAPS capsule Take 1 capsule (50,000 Units total) by mouth every 7 (seven) days.   ARIPiprazole  (ABILIFY ) 15 MG tablet Take 1 tablet (15 mg total) by mouth daily.   NIFEdipine  (ADALAT  CC) 30 MG 24 hr tablet Take 1 tablet (30 mg total) by mouth daily. (Patient not taking: Reported on 09/13/2024)      Medical Decision Making  Recommend admission to the continuous observation unit for safety monitoring and re-eval in the am for SI/HI/AVH.  Recommend collaboration with SW to address psycho-social needs and access to community resources.   Lab Orders         POC urine preg, ED         POCT Urine Drug Screen - (I-Screen)      Home medications continued -Abilify  15 mg PO daily for mood stabilization Nifedipine  24 hr 30 mg tablet for HTN.  Other Prns -Tylenol , Maalox, MOM, Atarax , Trazodone  -Agitation protocol medications   Recommendations  Based on my evaluation the patient does not appear to have an emergency medical condition.  Recommend admission to the continuous observation unit for safety monitoring overnight and re-eval in the am for SI/HI/AVH.  Recommend collaboration with SW to address  psycho-social needs and access to community resources.   Thurman LULLA Ivans, NP 10/01/2024  1:39 AM

## 2024-10-01 NOTE — ED Notes (Signed)
 Pt A&O x 4, returns with complaint of SI, plan to run into traffic.  Pt irritable, guarded, resistant to care.left  Comfort measures given.  Pt resting at  present.  Monitoring for safety.

## 2024-10-01 NOTE — Discharge Instructions (Addendum)
 Based on the information that you have provided and the presenting issues outpatient services and resources for have been recommended.  It is imperative that you follow through with treatment recommendations within 5-7 days from the of discharge to mitigate further risk to your safety and mental well-being. A list of referrals has been provided below to get you started.  You are not limited to the list provided.  In case of an urgent crisis, you may contact the Mobile Crisis Unit with [facility] at [phone].                Housing Resources   [facility] Address: [address], [place], [place] [address] Phone: [phone] Hours: M -F: 8:30am-5:30pm Description: [facility] is a Management Consultant that works with individuals and families at any stage of the continuum from homelessness to homeownership.   [facility] Address: [address] [place], [place], [place] [address] Phone: [phone] Hours: M -F: 8:30am-5:00pm Description: [facility] mission is to express the love of God to our neighbors in need by offering food, shelter and solutions.   [facility] Address: [address], [place], [place] [address] Phone: [phone]  [facility] [place]  Address: [phone] [address] [place], [place] [address] Phone: [phone] Description: Emergency Rental Assistance Program (COVID-19 related); Foreclosure Clinic referrals   [facility] Address: [address] [place], [place] [address] Phone: [phone] Description: Voucher based housing program by [employer]; openings vary and typically has waitlists   [employer] [facility] Address: [address] [place], [place] [address] Phone: [phone]  The [facility] provides food, shelter and other programs and services to the homeless men of  [place] through our [employer].  By offering safe shelter, three meals a day, clean clothing, Biblical counseling, financial planning, vocational training, GED/education and employment assistance, we've helped mend the shattered lives of many homeless men since opening in 1974.  We have approximately 267 beds available, with a max of 312 beds including mats for emergency situations and currently house an average of 270 men a night.  Prospective Client Check-In Information Photo ID Required (State/ Out of State/ [facility]) - if photo ID is not available, clients are required to have a printout of a police/sheriff's criminal history report. Help out with chores around the [place]. No sex offender of any type (pending, charged, registered and/or any other sex related offenses) will be permitted to check in. Must be willing to abide by all rules, regulations, and policies established by the [employer]. The following will be provided - shelter, food, clothing, and biblical counseling. If you or someone you know is in need of assistance at our [facility] shelter in [place], [place], please call [phone] ext. [phone].  [facility] [employer] hours are Monday-Friday only.  Discharge recommendations:   Medications: Patient is to take medications as prescribed. The patient or patient's guardian is to contact a medical professional and/or outpatient provider to address any new side effects that develop. The patient or the patient's guardian should update outpatient providers of any new medications and/or medication changes.    Outpatient Follow up: Please review list of outpatient resources for psychiatry and counseling. Please follow up with your primary care provider for all medical related needs.    Therapy: We recommend that patient participate in individual therapy to address mental  health concerns.   Atypical antipsychotics: If you are prescribed an  atypical antipsychotic, it is recommended that your height, weight, BMI, blood pressure, fasting lipid panel, and fasting blood sugar be monitored by your outpatient providers.  Safety:   The following safety precautions should be taken:   No sharp objects. This includes scissors, razors, scrapers, and putty knives.   Chemicals should be removed and locked up.   Medications should be removed and locked up.   Weapons should be removed and locked up. This includes firearms, knives and instruments that can be used to cause injury.   The patient should abstain from use of illicit substances/drugs and abuse of any medications.  If symptoms worsen or do not continue to improve or if the patient becomes actively suicidal or homicidal then it is recommended that the patient return to the closest hospital emergency department, the [facility], or call 911 for further evaluation and treatment. National Suicide Prevention Lifeline 1-800-SUICIDE or 463-821-8118.  About 988 988 offers 24/7 access to trained crisis counselors who can help people experiencing mental health-related distress. People can call or text 988 or chat 988lifeline.org for themselves or if they are worried about a loved one who may need crisis support.    You are encouraged to follow up with [facility] for outpatient treatment.  Walk in/ Open Access Hours: Monday - Friday 8AM - 10 AM (To see provider and therapist) Please arrive by 7am  [facility] [address] [place], [place] 2nd Floor [phone]

## 2024-10-01 NOTE — ED Notes (Signed)
 Patient is being discharged with Taxi voucher to the [facility]

## 2024-10-01 NOTE — ED Notes (Addendum)
 Pt reports headache 10/10 provided tylenol .  Instructed to ask if she wanted to take BP medication or wait till in the morning pt reported she wants to wait till the morning.  Pt requesting sandwich, food provided.  Provider notified about bp.

## 2024-10-01 NOTE — ED Provider Notes (Signed)
 FBC/OBS ASAP Discharge Summary  Date and Time: 10/01/2024 10:57 AM  Name: Melinda Jimenez  MRN:  [id number]   Discharge Diagnoses:  Final diagnoses:  Homelessness  Malingering  Suicidal ideations    Subjective:  Nobody wants to help me  Stay Summary: Melinda Jimenez is a 39 year old female with a psychiatric history significant for bipolar I disorder, borderline personality disorder, PTSD, anxiety, and panic attacks, as well as a medical history of chronic hypertension. She initially presented voluntarily as a walk-in to [facility] with complaints of headache and persistent perceptions that others were bothering her. The patient was evaluated, did not meet criteria for admission, and was discharged with recommendations. According to front desk staff, the patient did not leave the facility following discharge and was observed remaining in the waiting area using her cell phone, which raised concern for malingering and possible hospitalization-seeking behavior related to housing needs. The patient is now reporting suicidal statements, specifically stating she will run in front of a car due to inability to secure shelter and exposure to cold weather. She is endorsing homelessness at this time. Wendall, Chinwendu V, NP @ [phone])   Assessment: On assessment this morning, Melinda Jimenez was observed lying in bed in the [facility] observation unit. Upon approach, the patient was calm and cooperative, with no acute distress noted. The patient stated, I have been everywhere and nobody wants to help me. When asked to clarify what assistance she was seeking, the patient reported that she needs a place to stay and expressed a desire to remain in the [facility] observation unit for at least two days. She denied wanting inpatient psychiatric admission, stating that she had a negative experience during her most recent hospitalization. The patient reported being hospitalized for two weeks at an out-of-town facility, during which  she was unable to sleep during the entire stay. Per chart review, the patient was admitted to [facility] for psychiatric stabilization on 12/16. At this time, the patient endorses chronic passive suicidal ideation, stating, I always have these thoughts, but denies any active suicidal ideation, intent, or plan. She also denies homicidal ideation. Patient reports that she was able to sleep throughout the night last night and endorses good appetite.  The patient is alert and oriented to person, place, time, and situation. She remained attentive throughout the assessment interview. Her mood is depressed with a congruent affect. Speech is normal in rate, volume, and tone, and behavior is appropriate. Objectively, there is no evidence of psychosis, mania, or delusional thinking. Thought processes are coherent, logical, and goal directed, with no observed distractibility or preoccupation. The patient is able to engage in meaningful conversation and demonstrates intact attention during the interview.   Plan: Based on the current assessment, the patient does not appear to be a danger to herself or others at this time. She denies active suicidal ideation, intent, or plan and reports only chronic passive suicidal thoughts. The patient has consistently expressed that her primary concern is the need for assistance with securing a place to stay. Additionally, per chart review, the provider who evaluated the patient the previous evening determined that she did not meet criteria for inpatient admission and discharged her from care. The patient was subsequently admitted to observation after expressing suicidal statements in the context of being unable to find housing following discharge. Given the absence of acute safety concerns, the patients stated goals, and her presentation during the current assessment, this provider believes it is appropriate and safe to discharge the  patient with outpatient mental health  resources as well as referrals for shelter and homelessness support. The case was discussed with Dr. Cole, who is in agreement with this plan. The case was also reviewed with AVA, LCSW, who recommended discharge to [facility] where the patient can receive assistance with shelter placement and additional supportive services.   Total Time spent with patient: 45 minutes   Tobacco Cessation:  N/A, patient does not currently use tobacco products  Current Medications:  Current Facility-Administered Medications  Medication Dose Route Frequency Provider Last Rate Last Admin   acetaminophen  (TYLENOL ) tablet 650 mg  650 mg Oral Q6H PRN Onuoha, Chinwendu V, NP   650 mg at 10/01/24 0226   alum & mag hydroxide-simeth (MAALOX/MYLANTA) 200-200-20 MG/5ML suspension 30 mL  30 mL Oral Q4H PRN Onuoha, Chinwendu V, NP       ARIPiprazole  (ABILIFY ) tablet 15 mg  15 mg Oral Daily Onuoha, Chinwendu V, NP   15 mg at 10/01/24 9078   haloperidol  (HALDOL ) tablet 5 mg  5 mg Oral TID PRN Onuoha, Chinwendu V, NP       And   diphenhydrAMINE  (BENADRYL ) capsule 50 mg  50 mg Oral TID PRN Onuoha, Chinwendu V, NP       haloperidol  lactate (HALDOL ) injection 5 mg  5 mg Intramuscular TID PRN Onuoha, Chinwendu V, NP       And   diphenhydrAMINE  (BENADRYL ) injection 50 mg  50 mg Intramuscular TID PRN Onuoha, Chinwendu V, NP       And   LORazepam  (ATIVAN ) injection 2 mg  2 mg Intramuscular TID PRN Onuoha, Chinwendu V, NP       haloperidol  lactate (HALDOL ) injection 10 mg  10 mg Intramuscular TID PRN Onuoha, Chinwendu V, NP       And   diphenhydrAMINE  (BENADRYL ) injection 50 mg  50 mg Intramuscular TID PRN Onuoha, Chinwendu V, NP       And   LORazepam  (ATIVAN ) injection 2 mg  2 mg Intramuscular TID PRN Onuoha, Chinwendu V, NP       hydrOXYzine  (ATARAX ) tablet 25 mg  25 mg Oral TID PRN Onuoha, Chinwendu V, NP       magnesium  hydroxide (MILK OF MAGNESIA) suspension 30 mL  30 mL Oral Daily PRN Onuoha,  Chinwendu V, NP       NIFEdipine  (PROCARDIA -XL/NIFEDICAL-XL) 24 hr tablet 30 mg  30 mg Oral Daily Onuoha, Chinwendu V, NP   30 mg at 10/01/24 9078   traZODone  (DESYREL ) tablet 50 mg  50 mg Oral QHS PRN Onuoha, Chinwendu V, NP       Current Outpatient Medications  Medication Sig Dispense Refill   albuterol  (VENTOLIN  HFA) 108 (90 Base) MCG/ACT inhaler Inhale 2 puffs into the lungs every 4 (four) hours as needed for wheezing or shortness of breath.     ARIPiprazole  (ABILIFY ) 15 MG tablet Take 1 tablet (15 mg total) by mouth daily. 30 tablet 0   hydrOXYzine  (ATARAX ) 25 MG tablet Take 1 tablet (25 mg total) by mouth every 8 (eight) hours as needed for anxiety. (Patient not taking: Reported on 09/13/2024)     NIFEdipine  (ADALAT  CC) 30 MG 24 hr tablet Take 1 tablet (30 mg total) by mouth daily. (Patient not taking: Reported on 09/13/2024) 30 tablet 0    PTA Medications:  Facility Ordered Medications  Medication   acetaminophen  (TYLENOL ) tablet 650 mg   alum & mag hydroxide-simeth (MAALOX/MYLANTA) 200-200-20 MG/5ML suspension 30 mL   magnesium   hydroxide (MILK OF MAGNESIA) suspension 30 mL   haloperidol  (HALDOL ) tablet 5 mg   And   diphenhydrAMINE  (BENADRYL ) capsule 50 mg   haloperidol  lactate (HALDOL ) injection 5 mg   And   diphenhydrAMINE  (BENADRYL ) injection 50 mg   And   LORazepam  (ATIVAN ) injection 2 mg   haloperidol  lactate (HALDOL ) injection 10 mg   And   diphenhydrAMINE  (BENADRYL ) injection 50 mg   And   LORazepam  (ATIVAN ) injection 2 mg   hydrOXYzine  (ATARAX ) tablet 25 mg   traZODone  (DESYREL ) tablet 50 mg   ARIPiprazole  (ABILIFY ) tablet 15 mg   NIFEdipine  (PROCARDIA -XL/NIFEDICAL-XL) 24 hr tablet 30 mg   PTA Medications  Medication Sig   albuterol  (VENTOLIN  HFA) 108 (90 Base) MCG/ACT inhaler Inhale 2 puffs into the lungs every 4 (four) hours as needed for wheezing or shortness of breath.   ARIPiprazole  (ABILIFY ) 15 MG tablet Take 1 tablet (15 mg total) by mouth daily.    hydrOXYzine  (ATARAX ) 25 MG tablet Take 1 tablet (25 mg total) by mouth every 8 (eight) hours as needed for anxiety. (Patient not taking: Reported on 09/13/2024)   NIFEdipine  (ADALAT  CC) 30 MG 24 hr tablet Take 1 tablet (30 mg total) by mouth daily. (Patient not taking: Reported on 09/13/2024)       02/17/2020   10:08 AM 05/14/2017    1:05 PM  Depression screen PHQ 2/9  Decreased Interest 3 0  Down, Depressed, Hopeless 3 0  PHQ - 2 Score 6 0  Altered sleeping 3 0  Tired, decreased energy 3 3  Change in appetite 1 0  Feeling bad or failure about yourself  3 0  Trouble concentrating 3 0  Moving slowly or fidgety/restless 2 0  Suicidal thoughts 2 0  PHQ-9 Score 23  3      Data saved with a previous flowsheet row definition    Flowsheet Row ED from 10/01/2024 in [facility] Most recent reading at 10/01/2024  3:00 AM ED from 09/30/2024 in [facility] Most recent reading at 09/30/2024 10:37 PM ED from 09/30/2024 in [facility] Emergency Department at [facility] Most recent reading at 09/30/2024  7:05 PM  C-SSRS RISK CATEGORY High Risk No Risk No Risk    Musculoskeletal  Strength & Muscle Tone: within normal limits Gait & Station: normal Patient leans: Right  Psychiatric Specialty Exam  Presentation  General Appearance:  Appropriate for Environment  Eye Contact: Good  Speech: Clear and Coherent  Speech Volume: Normal  Handedness: Right   Mood and Affect  Mood: Depressed  Affect: Congruent   Thought Process  Thought Processes: Coherent  Descriptions of Associations:Intact  Orientation:None  Thought Content:WDL  Diagnosis of Schizophrenia or Schizoaffective disorder in past: No  Duration of Psychotic Symptoms: Less than six months   Hallucinations:Hallucinations: None  Ideas of Reference:None  Suicidal Thoughts:Suicidal Thoughts: Yes, Passive SI Active Intent and/or Plan: Without Plan SI  Passive Intent and/or Plan: Without Plan  Homicidal Thoughts:Homicidal Thoughts: No   Sensorium  Memory: Immediate Good; Recent Fair; Remote Fair  Judgment: Fair  Insight: Fair   Art Therapist  Concentration: Good  Attention Span: Good  Recall: Good  Fund of Knowledge: Good  Language: Good   Psychomotor Activity  Psychomotor Activity: Psychomotor Activity: Normal   Assets  Assets: Communication Skills; Desire for Improvement   Sleep  Sleep: Sleep: Good  No Safety Checks orders active in given range  Nutritional Assessment (For OBS and FBC admissions only)  Has the patient had a weight loss or gain of 10 pounds or more in the last 3 months?: No Has the patient had a decrease in food intake/or appetite?: No Does the patient have dental problems?: No Does the patient have eating habits or behaviors that may be indicators of an eating disorder including binging or inducing vomiting?: No Has the patient recently lost weight without trying?: 0 Has the patient been eating poorly because of a decreased appetite?: 0 Malnutrition Screening Tool Score: 0    Physical Exam  Physical Exam Vitals and nursing note reviewed.  Neurological:     Mental Status: She is alert.  Psychiatric:        Attention and Perception: Attention normal.        Mood and Affect: Mood is depressed.        Speech: Speech normal.        Behavior: Behavior is cooperative.    Review of Systems  Psychiatric/Behavioral:  Positive for depression and suicidal ideas. Negative for hallucinations. The patient does not have insomnia.   All other systems reviewed and are negative.  Blood pressure 137/82, pulse 85, temperature 97.7 F (36.5 C), temperature source Oral, resp. rate 18, SpO2 100%. There is no height or weight on file to calculate BMI.  Demographic Factors:  Low socioeconomic status and Unemployed  Loss Factors: Decrease in vocational status and Financial  problems/change in socioeconomic status  Historical Factors: Impulsivity  Risk Reduction Factors:   Positive coping skills or problem solving skills  Continued Clinical Symptoms:  Depression:   Impulsivity  Cognitive Features That Contribute To Risk:  None    Suicide Risk:  Minimal: No identifiable suicidal ideation.  Patients presenting with no risk factors but with morbid ruminations; may be classified as minimal risk based on the severity of the depressive symptoms  Plan Of Care/Follow-up recommendations:  Based on the current assessment, the patient does not appear to be a danger to herself or others at this time. She denies active suicidal ideation, intent, or plan and reports only chronic passive suicidal thoughts. The patient has consistently expressed that her primary concern is the need for assistance with securing a place to stay. Additionally, per chart review, the provider who evaluated the patient the previous evening determined that she did not meet criteria for inpatient admission and discharged her from care. The patient was subsequently admitted to observation after expressing suicidal statements in the context of being unable to find housing following discharge. Given the absence of acute safety concerns, the patients stated goals, and her presentation during the current assessment, this provider believes it is appropriate and safe to discharge the patient with outpatient mental health resources as well as referrals for shelter and homelessness support.  -The patient was provided with resources to follow up with [facility] outpatient services for ongoing mental health support.  -The patient was encouraged to continue her home medications as prescribed. She was offered medication samples and/or a prescription to be sent; however, the patient declined both options stating that she currently has a supply of the medications.   Disposition: The patient will be discharged to the  [facility]), where the patient can receive assistance with shelter placement and additional supportive services.    Ardelle JONELLE Blush, NP 10/01/2024, 10:57 AM

## 2024-10-01 NOTE — ED Notes (Signed)
 Patient is resting comfortably.
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
## 2024-10-18 ENCOUNTER — Ambulatory Visit (HOSPITAL_COMMUNITY)
Admission: EM | Admit: 2024-10-18 | Discharge: 2024-10-18 | Disposition: A | Discharge status: Home / Self Care | Payer: MEDICAID | Attending: Psychiatry | Admitting: Psychiatry

## 2024-10-18 DIAGNOSIS — Z765 Malingerer [conscious simulation]: Secondary | ICD-10-CM | POA: Insufficient documentation

## 2024-10-18 DIAGNOSIS — F603 Borderline personality disorder: Secondary | ICD-10-CM | POA: Insufficient documentation

## 2024-10-18 DIAGNOSIS — F319 Bipolar disorder, unspecified: Secondary | ICD-10-CM | POA: Insufficient documentation

## 2024-10-18 DIAGNOSIS — K589 Irritable bowel syndrome without diarrhea: Secondary | ICD-10-CM | POA: Insufficient documentation

## 2024-10-18 DIAGNOSIS — R519 Headache, unspecified: Secondary | ICD-10-CM | POA: Insufficient documentation

## 2024-10-18 DIAGNOSIS — R45851 Suicidal ideations: Secondary | ICD-10-CM | POA: Insufficient documentation

## 2024-10-18 DIAGNOSIS — I10 Essential (primary) hypertension: Secondary | ICD-10-CM | POA: Insufficient documentation

## 2024-10-18 DIAGNOSIS — Z5901 Sheltered homelessness: Secondary | ICD-10-CM | POA: Insufficient documentation

## 2024-10-18 MED ORDER — ARIPIPRAZOLE 15 MG PO TABS: 15.0000 mg | ORAL_TABLET | Freq: Every day | ORAL | 0 refills | Status: DC
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
## 2024-10-18 NOTE — Discharge Instructions (Signed)
 Discharge recommendations:   Medications: Patient is to take medications as prescribed. The patient or patient's guardian is to contact a medical professional and/or outpatient provider to address any new side effects that develop. The patient or the patient's guardian should update outpatient providers of any new medications and/or medication changes.    Outpatient Follow up: Please review list of outpatient resources for psychiatry and counseling. Please follow up with your primary care provider for all medical related needs.    Therapy: We recommend that patient participate in individual therapy to address mental health concerns.   Atypical antipsychotics: If you are prescribed an atypical antipsychotic, it is recommended that your height, weight, BMI, blood pressure, fasting lipid panel, and fasting blood sugar be monitored by your outpatient providers.  Safety:   The following safety precautions should be taken:   No sharp objects. This includes scissors, razors, scrapers, and putty knives.   Chemicals should be removed and locked up.   Medications should be removed and locked up.   Weapons should be removed and locked up. This includes firearms, knives and instruments that can be used to cause injury.   The patient should abstain from use of illicit substances/drugs and abuse of any medications.  If symptoms worsen or do not continue to improve or if the patient becomes actively suicidal or homicidal then it is recommended that the patient return to the closest hospital emergency department, the [facility], or call 911 for further evaluation and treatment. National Suicide Prevention Lifeline 1-800-SUICIDE or (351)801-7106.  About 988 988 offers 24/7 access to trained crisis counselors who can help people experiencing mental health-related distress. People can call or text 988 or chat 988lifeline.org for themselves or if they are worried about a  loved one who may need crisis support.    You are encouraged to follow up with [facility] for outpatient treatment.  Walk in/ Open Access Hours: Monday - Friday 8AM - 11AM (To see provider and therapist)  [facility] [address] [place], [place] [phone]

## 2024-10-18 NOTE — ED Provider Notes (Signed)
 Behavioral Health Urgent Care Medical Screening Exam  Patient Name: Melinda Jimenez MRN: [id number] Date of Evaluation: 10/18/24 Chief Complaint:  A lot going on Diagnosis:  Final diagnoses:  Suicidal ideation  Malingering    History of Present illness: Melinda Jimenez, 39 y.o., female  presented to [facility] voluntarily as a walk in, unaccompanied, with complaints of feeling overwhelmed and having a headache. Patient seen face to face by this provider, and chart reviewed on 10/18/24.  Per chart review patient has a past psychiatric history of borderline personality disorder, bipolar disorder, PTSD and anxiety.  Pertinent medical history of hypertension, IBS, GERD, HSV and anemia.  Last inpatient admission was in November 2025, no current outpatient providers patient did not follow-up with outpatient resources/appointments.  It is noted that patient has had 19 ED visits in the past 6 months.  There is concern for secondary gain as patient is experiencing homelessness and has continuously not followed up with outpatient mental health resources as provided upon discharging.   On evaluation Melinda Jimenez reports I have been dealing with a lot.  Patient does not further discuss her stressors and when asked about her stressors patient makes 1 word comment of suicide.  Patient denies having any plan or intent to harm herself or others at this time.  Patient reports decreased sleep and then begins to talk about her headaches and tooth aches.  Patient reports that she takes Tylenol  for pain and is offered Tylenol  to help with pain however patient declines stating it makes me sleepy.  Patient reports that she has not taken her Abilify  in a couple days as she ran out.  Patient reports that she was staying in a woman shelter but felt like the women there were bullying her so she left and is currently staying in a hotel room.  Patient reports that she has remained compliant  with her nifedipine  for blood pressure and blood pressure is within normal limits today.  We discussed the treatment for depression and suicidal thoughts involves her being involved in her own mental health treatment and attending outpatient psychiatry appointments as referred to by multiple visits.  Patient states why would I need to follow-up with outpatient when I can just get my meds from you guys?  I explained that we are in urgent care setting for crisis not for medication management which is done in an outpatient setting.  I agreed to provide patient with a 2-week supply of her Abilify  15 mg daily but informed her that she will need to follow-up with outpatient services to continue on with medication management.   During evaluation Melinda Jimenez is sitting in a separate room with head on table, in no acute distress. She is alert/oriented x 4; cooperative; and mood congruent with affect. She is speaking in a clear tone at moderate volume, and normal pace; with good eye contact.  Her thought process is coherent and relevant; There is no objective indication that she is currently responding to internal/external stimuli or experiencing delusional thought content. She has denied homicidal ideation, auditory hallucinations, visual hallucinations and/or paranoia. Patient has remained cooperative throughout assessment and has answered questions appropriately.        Flowsheet Row ED from 10/18/2024 in [facility] ED from 10/01/2024 in [facility] ED from 09/30/2024 in [facility]  C-SSRS RISK CATEGORY High Risk High Risk No Risk    Psychiatric Specialty Exam  Presentation  General Appearance:Appropriate for Environment  Eye Contact:Good  Speech:Clear and Coherent  Speech Volume:Normal  Handedness:Right   Mood and Affect  Mood: Depressed  Affect: Congruent   Thought Process  Thought  Processes: Coherent  Descriptions of Associations:Intact  Orientation:None  Thought Content:WDL  Diagnosis of Schizophrenia or Schizoaffective disorder in past: No  Duration of Psychotic Symptoms: Less than six months  Hallucinations:None  Ideas of Reference:None  Suicidal Thoughts:Yes, Passive Without Plan Without Plan  Homicidal Thoughts:No With Plan Without Intent; Without Means to [employer]  Memory: Immediate Good; Recent Fair; Remote Fair  Judgment: Fair  Insight: Fair   Executive Functions  Concentration: Good  Attention Span: Good  Recall: Good  Fund of Knowledge: Good  Language: Good   Psychomotor Activity  Psychomotor Activity: Normal   Assets  Assets: Communication Skills; Desire for Improvement   Sleep  Sleep: Good  Number of hours:  8   Physical Exam: Physical Exam ROS Blood pressure 129/87, pulse 100, temperature 97.8 F (36.6 C), temperature source Oral, resp. rate 18, SpO2 100%. There is no height or weight on file to calculate BMI.  Musculoskeletal: Strength & Muscle Tone: within normal limits Gait & Station: normal Patient leans: N/A   [facility] MSE Discharge Disposition for Follow up and Recommendations: Based on my evaluation the patient does not appear to have an emergency medical condition and can be discharged with resources and follow up care in outpatient services for Medication Management and Individual Therapy Patient is evaluated and discharged from behavioral health urgent care.  Patient came in with complaints of passive suicidal ideations in the context of no longer being able to stay at women shelter and having a headache.  Based on patient's history of multiple ER visits reporting suicidal ideation, which is chronic in nature, there is some concern for secondary gain as patient does not follow-up with outpatient resources and is noncompliant with medications.    Patient does report that she has  been out of medications for a few days so she is provided with a 14-day supply of the Abilify  15 mg and encouraged to follow-up with open access for ongoing outpatient medication management.  Patient reported headache however declined medication for pain.  Patient was provided with bus pass for transportation.  Patient also provided with outpatient resources including times for open access walk-in services.  Shelter resources also provided to patient upon discharge.  Melinda JAYSON Mcardle, NP 10/18/2024, 6:43 PM

## 2024-10-21 ENCOUNTER — Emergency Department (HOSPITAL_COMMUNITY)
Admission: EM | Admit: 2024-10-21 | Discharge: 2024-10-22 | Disposition: A | Discharge status: Other Acute Inpatient Hospital | Payer: MEDICAID | Attending: Emergency Medicine | Admitting: Emergency Medicine

## 2024-10-21 ENCOUNTER — Emergency Department (HOSPITAL_COMMUNITY): Payer: MEDICAID

## 2024-10-21 ENCOUNTER — Encounter (HOSPITAL_COMMUNITY): Payer: Self-pay | Admitting: Emergency Medicine

## 2024-10-21 ENCOUNTER — Other Ambulatory Visit: Payer: Self-pay

## 2024-10-21 DIAGNOSIS — Z79899 Other long term (current) drug therapy: Secondary | ICD-10-CM | POA: Diagnosis not present

## 2024-10-21 DIAGNOSIS — F603 Borderline personality disorder: Secondary | ICD-10-CM | POA: Insufficient documentation

## 2024-10-21 DIAGNOSIS — F332 Major depressive disorder, recurrent severe without psychotic features: Secondary | ICD-10-CM | POA: Diagnosis not present

## 2024-10-21 DIAGNOSIS — R079 Chest pain, unspecified: Secondary | ICD-10-CM | POA: Insufficient documentation

## 2024-10-21 DIAGNOSIS — R45851 Suicidal ideations: Secondary | ICD-10-CM | POA: Insufficient documentation

## 2024-10-21 DIAGNOSIS — F4310 Post-traumatic stress disorder, unspecified: Secondary | ICD-10-CM | POA: Diagnosis present

## 2024-10-21 DIAGNOSIS — R519 Headache, unspecified: Secondary | ICD-10-CM | POA: Diagnosis not present

## 2024-10-21 DIAGNOSIS — F32A Depression, unspecified: Secondary | ICD-10-CM | POA: Diagnosis present

## 2024-10-21 DIAGNOSIS — K0889 Other specified disorders of teeth and supporting structures: Secondary | ICD-10-CM | POA: Insufficient documentation

## 2024-10-21 DIAGNOSIS — Z91040 Latex allergy status: Secondary | ICD-10-CM | POA: Insufficient documentation

## 2024-10-21 DIAGNOSIS — F333 Major depressive disorder, recurrent, severe with psychotic symptoms: Secondary | ICD-10-CM | POA: Diagnosis present

## 2024-10-21 LAB — URINE DRUG SCREEN
Amphetamines: NEGATIVE
Barbiturates: NEGATIVE
Benzodiazepines: NEGATIVE
Cocaine: NEGATIVE
Fentanyl: NEGATIVE
Methadone Scn, Ur: NEGATIVE
Opiates: NEGATIVE
Tetrahydrocannabinol: NEGATIVE

## 2024-10-21 LAB — CBC WITH DIFFERENTIAL/PLATELET
Abs Immature Granulocytes: 0.03 K/uL (ref 0.00–0.07)
Basophils Absolute: 0 K/uL (ref 0.0–0.1)
Basophils Relative: 0 %
Eosinophils Absolute: 0.1 K/uL (ref 0.0–0.5)
Eosinophils Relative: 1 %
HCT: 34 % — ABNORMAL LOW (ref 36.0–46.0)
Hemoglobin: 10.3 g/dL — ABNORMAL LOW (ref 12.0–15.0)
Immature Granulocytes: 0 %
Lymphocytes Relative: 37 %
Lymphs Abs: 4.7 K/uL — ABNORMAL HIGH (ref 0.7–4.0)
MCH: 20.7 pg — ABNORMAL LOW (ref 26.0–34.0)
MCHC: 30.3 g/dL (ref 30.0–36.0)
MCV: 68.3 fL — ABNORMAL LOW (ref 80.0–100.0)
Monocytes Absolute: 0.8 K/uL (ref 0.1–1.0)
Monocytes Relative: 6 %
Neutro Abs: 7.3 K/uL (ref 1.7–7.7)
Neutrophils Relative %: 56 %
Platelets: 361 K/uL (ref 150–400)
RBC: 4.98 MIL/uL (ref 3.87–5.11)
RDW: 18.9 % — ABNORMAL HIGH (ref 11.5–15.5)
Smear Review: NORMAL
WBC: 12.9 K/uL — ABNORMAL HIGH (ref 4.0–10.5)
nRBC: 0 % (ref 0.0–0.2)

## 2024-10-21 LAB — COMPREHENSIVE METABOLIC PANEL WITH GFR
ALT: 12 U/L (ref 0–44)
AST: 17 U/L (ref 15–41)
Albumin: 4.1 g/dL (ref 3.5–5.0)
Alkaline Phosphatase: 83 U/L (ref 38–126)
Anion gap: 12 (ref 5–15)
BUN: 14 mg/dL (ref 6–20)
CO2: 25 mmol/L (ref 22–32)
Calcium: 9.5 mg/dL (ref 8.9–10.3)
Chloride: 101 mmol/L (ref 98–111)
Creatinine, Ser: 0.67 mg/dL (ref 0.44–1.00)
GFR, Estimated: >60 mL/min
Glucose, Bld: 115 mg/dL — ABNORMAL HIGH (ref 70–99)
Potassium: 3.7 mmol/L (ref 3.5–5.1)
Sodium: 138 mmol/L (ref 135–145)
Total Bilirubin: 0.4 mg/dL (ref 0.0–1.2)
Total Protein: 8.4 g/dL — ABNORMAL HIGH (ref 6.5–8.1)

## 2024-10-21 LAB — HCG, SERUM, QUALITATIVE: Preg, Serum: NEGATIVE

## 2024-10-21 LAB — ETHANOL: Alcohol, Ethyl (B): <15 mg/dL

## 2024-10-21 LAB — TROPONIN T, HIGH SENSITIVITY: Troponin T High Sensitivity: <6 ng/L (ref 0–19)

## 2024-10-21 NOTE — ED Provider Notes (Signed)
 " [place] EMERGENCY DEPARTMENT AT [facility] Provider Note   CSN: [id number] Arrival date & time: 10/21/24  2128     Patient presents with: Headache and Dizziness   Melinda Jimenez is a 39 y.o. female.  {Add pertinent medical, surgical, social history, OB history to YEP:67052} Patient presents to the emergency department for evaluation of multiple problems.  Patient initially came in because she has been experiencing a sharp frontal headache for approximately an hour.  Patient reports symptoms began after she got out of the shower.  She does not normally get headaches.  No focal neurologic symptoms with the headache.  She took OTC medications without improvement.  Patient reports that she has had increased belching and chest discomfort.  She is experiencing facial pain secondary to a toothache for which she is on amoxicillin .  Patient endorsed suicidal ideation in triage.  She reports that she has been depressed and thinking about harming herself, has a plan to take bunch of pills to kill herself.       Prior to Admission medications  Medication Sig Start Date End Date Taking? Authorizing Provider  albuterol  (VENTOLIN  HFA) 108 (90 Base) MCG/ACT inhaler Inhale 2 puffs into the lungs every 4 (four) hours as needed for wheezing or shortness of breath. 09/03/24   Delsie Lynwood Morene Lavone, MD  ARIPiprazole  (ABILIFY ) 15 MG tablet Take 1 tablet (15 mg total) by mouth daily. 10/18/24   Brent, Amanda C, NP  hydrOXYzine  (ATARAX ) 25 MG tablet Take 1 tablet (25 mg total) by mouth every 8 (eight) hours as needed for anxiety. Patient not taking: Reported on 09/13/2024 09/03/24   Delsie Lynwood Morene Lavone, MD  NIFEdipine  (ADALAT  CC) 30 MG 24 hr tablet Take 1 tablet (30 mg total) by mouth daily. Patient not taking: Reported on 09/13/2024 09/09/24   Delsie Lynwood Morene Lavone, MD  fluticasone  (FLONASE ) 50 MCG/ACT nasal spray Place 1 spray into both nostrils daily. Use for  2-3 weeks to help with the nasal congestion and ear pain Patient not taking: Reported on 01/21/2020 01/20/20 02/11/20  Doretha Folks, MD  sertraline  (ZOLOFT ) 25 MG tablet Take one tablet daily. If tolerating, increase to two tablets in one week. Patient not taking: Reported on 01/21/2020 01/18/20 02/11/20  Prentiss Dorothyann Maxwell, MD  zolpidem  (AMBIEN ) 5 MG tablet Take 1 tablet (5 mg total) by mouth at bedtime as needed for sleep. Patient not taking: Reported on 01/21/2020 01/20/20 02/11/20  Doretha Folks, MD    Allergies: Latex, Zithromax  [azithromycin ], and Zofran  [ondansetron ]    Review of Systems  Updated Vital Signs BP (!) 133/102 (BP Location: Right Arm)   Pulse 100   Temp 99.2 F (37.3 C) (Oral)   Resp 19   Ht 5' 7 (1.702 m)   Wt 108 kg   SpO2 100%   BMI 37.28 kg/m   Physical Exam Vitals and nursing note reviewed.  Constitutional:      General: She is not in acute distress.    Appearance: She is well-developed.  HENT:     Head: Normocephalic and atraumatic.     Mouth/Throat:     Mouth: Mucous membranes are moist.  Eyes:     General: Vision grossly intact. Gaze aligned appropriately.     Extraocular Movements: Extraocular movements intact.     Conjunctiva/sclera: Conjunctivae normal.  Cardiovascular:     Rate and Rhythm: Normal rate and regular rhythm.     Pulses: Normal pulses.     Heart sounds: Normal heart  sounds, S1 normal and S2 normal. No murmur heard.    No friction rub. No gallop.  Pulmonary:     Effort: Pulmonary effort is normal. No respiratory distress.     Breath sounds: Normal breath sounds.  Abdominal:     General: Bowel sounds are normal.     Palpations: Abdomen is soft.     Tenderness: There is no abdominal tenderness. There is no guarding or rebound.     Hernia: No hernia is present.  Musculoskeletal:        General: No swelling.     Cervical back: Full passive range of motion without pain, normal range of motion and neck supple. No spinous  process tenderness or muscular tenderness. Normal range of motion.     Right lower leg: No edema.     Left lower leg: No edema.  Skin:    General: Skin is warm and dry.     Capillary Refill: Capillary refill takes less than 2 seconds.     Findings: No ecchymosis, erythema, rash or wound.  Neurological:     General: No focal deficit present.     Mental Status: She is alert and oriented to person, place, and time.     GCS: GCS eye subscore is 4. GCS verbal subscore is 5. GCS motor subscore is 6.     Cranial Nerves: Cranial nerves 2-12 are intact.     Sensory: Sensation is intact.     Motor: Motor function is intact.     Coordination: Coordination is intact.  Psychiatric:        Attention and Perception: Attention normal.        Speech: Speech normal.        Behavior: Behavior normal.     (all labs ordered are listed, but only abnormal results are displayed) Labs Reviewed  CBC WITH DIFFERENTIAL/PLATELET - Abnormal; Notable for the following components:      Result Value   WBC 12.9 (*)    Hemoglobin 10.3 (*)    HCT 34.0 (*)    MCV 68.3 (*)    MCH 20.7 (*)    RDW 18.9 (*)    Lymphs Abs 4.7 (*)    All other components within normal limits  COMPREHENSIVE METABOLIC PANEL WITH GFR - Abnormal; Notable for the following components:   Glucose, Bld 115 (*)    Total Protein 8.4 (*)    All other components within normal limits  ETHANOL  URINE DRUG SCREEN  HCG, SERUM, QUALITATIVE  TROPONIN T, HIGH SENSITIVITY  TROPONIN T, HIGH SENSITIVITY    EKG: EKG Interpretation Date/Time:  Wednesday October 21 2024 21:35:54 EST Ventricular Rate:  97 PR Interval:  144 QRS Duration:  84 QT Interval:  336 QTC Calculation: 426 R Axis:   26  Text Interpretation: Normal sinus rhythm Normal ECG Confirmed by Haze Lonni PARAS [phone]) on 10/21/2024 11:22:19 PM  Radiology: DG Chest 2 View Result Date: 10/21/2024 EXAM: 2 VIEW(S) XRAY OF THE CHEST 10/21/2024 10:56:00 PM COMPARISON: 08/06/2023  CLINICAL HISTORY: chest pain FINDINGS: LUNGS AND PLEURA: Low lung volumes. No focal pulmonary opacity. No pleural effusion. No pneumothorax. HEART AND MEDIASTINUM: No acute abnormality of the cardiac and mediastinal silhouettes. BONES AND SOFT TISSUES: No acute osseous abnormality. IMPRESSION: 1. No acute cardiopulmonary abnormality. Electronically signed by: Pinkie Pebbles MD 10/21/2024 11:00 PM EST RP Workstation: [id number]    {Document cardiac monitor, telemetry assessment procedure when appropriate:32947} Procedures   Medications Ordered in the ED - No data to display    {  Click here for ABCD2, HEART and other calculators REFRESH Note before signing:1}                              Medical Decision Making Amount and/or Complexity of Data Reviewed Labs: ordered. Decision-making details documented in ED Course. Radiology: ordered and independent interpretation performed. Decision-making details documented in ED Course. ECG/medicine tests: ordered and independent interpretation performed. Decision-making details documented in ED Course.   Differential Diagnosis considered includes, but not limited to: Hypertensive emergencies, Idiopathic intracranial hypertension, Space occupying lesions (tumors, abscesses, cysts), Acute hydrocephalus, Dural sinus thrombosis, Intracranial hemorrhage, Cerebrovascular accident or stroke, Meningitis and encephalitis, Migraine HA, Tension HA.  Differential Diagnosis considered includes, but not limited to: STEMI; NSTEMI; myocarditis; pericarditis; pulmonary embolism; aortic dissection; pneumothorax; pneumonia; gastritis; musculoskeletal pain  The patient with multiple unrelated complaints.  Patient with headache without any associated neurologic findings on exam.  CT head performed and under 6 hours from onset of headache, no acute abnormality including no signs of bleed.  No further workup necessary.  Treated as headache.  Patient with atypical chest  discomfort including belching.  Cardiac evaluation performed, including serial cardiac enzymes.    {Document critical care time when appropriate  Document review of labs and clinical decision tools ie CHADS2VASC2, etc  Document your independent review of radiology images and any outside records  Document your discussion with family members, caretakers and with consultants  Document social determinants of health affecting pt's care  Document your decision making why or why not admission, treatments were needed:32947:::1}   Final diagnoses:  None    ED Discharge Orders     None        "

## 2024-10-21 NOTE — ED Triage Notes (Addendum)
 BIB [facility] from home with c/o headache and dizziness that started approx 1 hour ago after she got out of the shower. Pt is belching and c/o nausea. Pt is now c/o chest pain. Pt is taking Amoxicillin  for tooth pain. Pt denies hx of HTN or DM  Pt also admits to SI when asked if she has any thoughts of hurting herself or others. Pt states that she has been thinking about taking a bunch of pills to kill herself.   BP 152/101 HR 110 Spo2 95 CBG 232

## 2024-10-22 ENCOUNTER — Inpatient Hospital Stay (HOSPITAL_COMMUNITY)
Admission: AD | Admit: 2024-10-22 | Discharge: 2024-10-29 | DRG: 885 | Disposition: A | Discharge status: Home / Self Care | Payer: MEDICAID | Source: Intra-hospital | Attending: Student in an Organized Health Care Education/Training Program | Admitting: Student in an Organized Health Care Education/Training Program

## 2024-10-22 ENCOUNTER — Emergency Department (HOSPITAL_COMMUNITY): Payer: MEDICAID

## 2024-10-22 ENCOUNTER — Encounter (HOSPITAL_COMMUNITY): Payer: Self-pay | Admitting: Psychiatry

## 2024-10-22 DIAGNOSIS — Z888 Allergy status to other drugs, medicaments and biological substances status: Secondary | ICD-10-CM

## 2024-10-22 DIAGNOSIS — D649 Anemia, unspecified: Secondary | ICD-10-CM | POA: Diagnosis present

## 2024-10-22 DIAGNOSIS — E559 Vitamin D deficiency, unspecified: Secondary | ICD-10-CM | POA: Diagnosis present

## 2024-10-22 DIAGNOSIS — Z803 Family history of malignant neoplasm of breast: Secondary | ICD-10-CM | POA: Diagnosis not present

## 2024-10-22 DIAGNOSIS — F411 Generalized anxiety disorder: Secondary | ICD-10-CM | POA: Diagnosis present

## 2024-10-22 DIAGNOSIS — L42 Pityriasis rosea: Secondary | ICD-10-CM | POA: Diagnosis present

## 2024-10-22 DIAGNOSIS — F333 Major depressive disorder, recurrent, severe with psychotic symptoms: Principal | ICD-10-CM | POA: Diagnosis present

## 2024-10-22 DIAGNOSIS — F332 Major depressive disorder, recurrent severe without psychotic features: Secondary | ICD-10-CM | POA: Diagnosis not present

## 2024-10-22 DIAGNOSIS — F315 Bipolar disorder, current episode depressed, severe, with psychotic features: Principal | ICD-10-CM | POA: Diagnosis present

## 2024-10-22 DIAGNOSIS — I10 Essential (primary) hypertension: Secondary | ICD-10-CM | POA: Diagnosis present

## 2024-10-22 DIAGNOSIS — F4312 Post-traumatic stress disorder, chronic: Secondary | ICD-10-CM | POA: Diagnosis present

## 2024-10-22 DIAGNOSIS — Z91040 Latex allergy status: Secondary | ICD-10-CM

## 2024-10-22 DIAGNOSIS — R45851 Suicidal ideations: Secondary | ICD-10-CM | POA: Diagnosis present

## 2024-10-22 DIAGNOSIS — Z7722 Contact with and (suspected) exposure to environmental tobacco smoke (acute) (chronic): Secondary | ICD-10-CM | POA: Diagnosis present

## 2024-10-22 DIAGNOSIS — K219 Gastro-esophageal reflux disease without esophagitis: Secondary | ICD-10-CM | POA: Diagnosis present

## 2024-10-22 DIAGNOSIS — K589 Irritable bowel syndrome without diarrhea: Secondary | ICD-10-CM | POA: Diagnosis present

## 2024-10-22 DIAGNOSIS — K30 Functional dyspepsia: Secondary | ICD-10-CM | POA: Diagnosis not present

## 2024-10-22 DIAGNOSIS — Z8632 Personal history of gestational diabetes: Secondary | ICD-10-CM

## 2024-10-22 DIAGNOSIS — Z833 Family history of diabetes mellitus: Secondary | ICD-10-CM

## 2024-10-22 DIAGNOSIS — Z5900 Homelessness unspecified: Secondary | ICD-10-CM | POA: Diagnosis not present

## 2024-10-22 DIAGNOSIS — Z5948 Other specified lack of adequate food: Secondary | ICD-10-CM

## 2024-10-22 DIAGNOSIS — R451 Restlessness and agitation: Secondary | ICD-10-CM | POA: Diagnosis present

## 2024-10-22 DIAGNOSIS — Z881 Allergy status to other antibiotic agents status: Secondary | ICD-10-CM

## 2024-10-22 DIAGNOSIS — K0889 Other specified disorders of teeth and supporting structures: Secondary | ICD-10-CM | POA: Diagnosis present

## 2024-10-22 DIAGNOSIS — Z79899 Other long term (current) drug therapy: Secondary | ICD-10-CM

## 2024-10-22 DIAGNOSIS — Z98891 History of uterine scar from previous surgery: Secondary | ICD-10-CM

## 2024-10-22 DIAGNOSIS — Z8759 Personal history of other complications of pregnancy, childbirth and the puerperium: Secondary | ICD-10-CM

## 2024-10-22 DIAGNOSIS — E669 Obesity, unspecified: Secondary | ICD-10-CM | POA: Diagnosis present

## 2024-10-22 DIAGNOSIS — Z8616 Personal history of COVID-19: Secondary | ICD-10-CM | POA: Diagnosis not present

## 2024-10-22 DIAGNOSIS — Z6834 Body mass index (BMI) 34.0-34.9, adult: Secondary | ICD-10-CM

## 2024-10-22 DIAGNOSIS — Z86711 Personal history of pulmonary embolism: Secondary | ICD-10-CM

## 2024-10-22 DIAGNOSIS — Z91148 Patient's other noncompliance with medication regimen for other reason: Secondary | ICD-10-CM | POA: Diagnosis not present

## 2024-10-22 DIAGNOSIS — F603 Borderline personality disorder: Secondary | ICD-10-CM | POA: Diagnosis present

## 2024-10-22 DIAGNOSIS — Z8619 Personal history of other infectious and parasitic diseases: Secondary | ICD-10-CM

## 2024-10-22 DIAGNOSIS — R7303 Prediabetes: Secondary | ICD-10-CM | POA: Diagnosis present

## 2024-10-22 DIAGNOSIS — K029 Dental caries, unspecified: Secondary | ICD-10-CM | POA: Diagnosis present

## 2024-10-22 DIAGNOSIS — Z560 Unemployment, unspecified: Secondary | ICD-10-CM | POA: Diagnosis not present

## 2024-10-22 DIAGNOSIS — G4700 Insomnia, unspecified: Secondary | ICD-10-CM | POA: Diagnosis present

## 2024-10-22 DIAGNOSIS — Z8249 Family history of ischemic heart disease and other diseases of the circulatory system: Secondary | ICD-10-CM

## 2024-10-22 DIAGNOSIS — F329 Major depressive disorder, single episode, unspecified: Secondary | ICD-10-CM | POA: Diagnosis present

## 2024-10-22 DIAGNOSIS — Z818 Family history of other mental and behavioral disorders: Secondary | ICD-10-CM

## 2024-10-22 DIAGNOSIS — Z87440 Personal history of urinary (tract) infections: Secondary | ICD-10-CM

## 2024-10-22 DIAGNOSIS — Z5941 Food insecurity: Secondary | ICD-10-CM

## 2024-10-22 LAB — TROPONIN T, HIGH SENSITIVITY: Troponin T High Sensitivity: <6 ng/L (ref 0–19)

## 2024-10-22 MED ORDER — ACETAMINOPHEN 325 MG PO TABS
650.0000 mg | ORAL_TABLET | Freq: Four times a day (QID) | ORAL | Status: DC | PRN
  Administered 2024-10-23: 650 mg via ORAL
  Filled 2024-10-22 (×2): qty 2

## 2024-10-22 MED ORDER — DIPHENHYDRAMINE HCL 50 MG/ML IJ SOLN: 50.0000 mg | Freq: Three times a day (TID) | INTRAMUSCULAR | Status: DC | PRN

## 2024-10-22 MED ORDER — HALOPERIDOL LACTATE 5 MG/ML IJ SOLN: 5.0000 mg | Freq: Three times a day (TID) | INTRAMUSCULAR | Status: DC | PRN

## 2024-10-22 MED ORDER — NICOTINE 21 MG/24HR TD PT24
21.0000 mg | MEDICATED_PATCH | Freq: Every day | TRANSDERMAL | Status: DC
  Filled 2024-10-22 (×2): qty 1

## 2024-10-22 MED ORDER — HALOPERIDOL 5 MG PO TABS
5.0000 mg | ORAL_TABLET | Freq: Three times a day (TID) | ORAL | Status: DC | PRN
  Filled 2024-10-22: qty 1

## 2024-10-22 MED ORDER — IBUPROFEN 800 MG PO TABS
800.0000 mg | ORAL_TABLET | Freq: Once | ORAL | Status: DC
  Filled 2024-10-22: qty 1

## 2024-10-22 MED ORDER — HALOPERIDOL LACTATE 5 MG/ML IJ SOLN: 10.0000 mg | Freq: Three times a day (TID) | INTRAMUSCULAR | Status: DC | PRN

## 2024-10-22 MED ORDER — ALUM & MAG HYDROXIDE-SIMETH 200-200-20 MG/5ML PO SUSP
30.0000 mL | ORAL | Status: DC | PRN
  Administered 2024-10-26: 30 mL via ORAL
  Filled 2024-10-22: qty 30

## 2024-10-22 MED ORDER — LORAZEPAM 2 MG/ML IJ SOLN: 2.0000 mg | Freq: Three times a day (TID) | INTRAMUSCULAR | Status: DC | PRN

## 2024-10-22 MED ORDER — ACETAMINOPHEN 500 MG PO TABS
1000.0000 mg | ORAL_TABLET | Freq: Once | ORAL | Status: DC
  Filled 2024-10-22: qty 2

## 2024-10-22 MED ORDER — DIPHENHYDRAMINE HCL 25 MG PO CAPS
50.0000 mg | ORAL_CAPSULE | Freq: Three times a day (TID) | ORAL | Status: DC | PRN
  Filled 2024-10-22: qty 2

## 2024-10-22 MED ORDER — MAGNESIUM HYDROXIDE 400 MG/5ML PO SUSP: 30.0000 mL | Freq: Every day | ORAL | Status: DC | PRN

## 2024-10-22 NOTE — [facility] Group Notes (Signed)
 Adult Psychoeducational Group Note  Date:  10/22/2024 Time:  9:13 PM  Group Topic/Focus:  Wrap-Up Group:   The focus of this group is to help patients review their daily goal of treatment and discuss progress on daily workbooks.  Participation Level:  Did Not Attend  Participation Quality:  Did not attend  Affect:  Did not attend  Cognitive:  Did not attend  Insight: None  Engagement in Group:   not attend  Modes of Intervention:  Did not attend  Additional Comments:   Lang Donia Law 10/22/2024, 9:13 PM

## 2024-10-22 NOTE — ED Provider Notes (Signed)
 Emergency Medicine Observation Re-evaluation Note  Melinda Jimenez is a 39 y.o. female, seen on rounds today.  Pt initially presented to the ED for complaints of Headache and Dizziness Currently, the patient is resting  Physical Exam  BP (!) 133/102 (BP Location: Right Arm)   Pulse 100   Temp 99.2 F (37.3 C) (Oral)   Resp 19   Ht 5' 7 (1.702 m)   Wt 108 kg   SpO2 100%   BMI 37.28 kg/m  Physical Exam General: nad Cardiac: RR Lungs: non labored Psych: calm   ED Course / MDM  EKG:EKG Interpretation Date/Time:  Wednesday October 21 2024 21:35:54 EST Ventricular Rate:  97 PR Interval:  144 QRS Duration:  84 QT Interval:  336 QTC Calculation: 426 R Axis:   26  Text Interpretation: Normal sinus rhythm Normal ECG Confirmed by Haze Lonni PARAS [phone]) on 10/21/2024 11:22:19 PM  I have reviewed the labs performed to date as well as medications administered while in observation.  Recent changes in the last 24 hours include TTS evaluated.  Plan  Current plan is for inpatient psych.    Neysa Caron PARAS, DO 10/22/24 [phone]

## 2024-10-22 NOTE — Group Note (Signed)
 LCSW Group Therapy Note   Group Date: 10/22/2024 Start Time: 1100 End Time: 1200   Participation:  did not attend  Type of Therapy:  Group Therapy  Title:  Healing Hearts:  A Safe Space for Grief  Objective:   The objective of this class, Healing Hearts:  A Safe Space for Grief, is to create a compassionate environment where participants can process their grief, explore different stages of grief, and discover ways to honor their loved ones through personal rituals.  3 Goals: Provide a safe and supportive space where participants feel comfortable sharing their feelings and experiences of grief without judgment. Educate participants about the stages of grief and emphasize that there is no right way to grieve or a fixed timeline for healing. Introduce the concept of rituals as a means to process grief, allowing individuals to honor their loved ones in a personal and meaningful way.  Summary:  In Healing Hearts: A Safe Space for Grief, we explored the unique and personal journey of grief, emphasizing that everyone experiences it differently.  We discussed the five stages of grief (denial, anger, bargaining, depression, and acceptance), with the understanding that grief is not linear.  Rituals were introduced as a way to help cope with loss, offering comfort and connection through meaningful actions such as lighting candles or taking memory walks. Participants were encouraged to express their emotions, focus on self-care, and reflect on moments of gratitude for their loved ones, recognizing that healing is a process and there is no timeline for grief.  Therapeutic Modalities: Elements of CBT: Challenge thoughts, reframe beliefs, self-compassion Elements of DBT: Mindfulness, distress tolerance, emotion regulation Supportive Therapy:  Provide validation, foster a safe and supportive group environment, normalize grief   Melinda Jimenez O Mort Smelser, LCSWA 10/22/2024  12:28 PM

## 2024-10-22 NOTE — Plan of Care (Signed)
   Problem: Safety: Goal: Periods of time without injury will increase Outcome: Progressing

## 2024-10-22 NOTE — Progress Notes (Signed)
 Patient is a 39 year old female who presented to [facility] from [facility] voluntarily for complaints of SI with a plan in the context of homelessness . Pt has a hx of MDD, BPD and  2 prior suicide attempts.  Pt currently denies HI and A/VH and doesn't appear to be responding to internal stimuli. Pt denies using illicit substances and  drinking alcohol. Pt continues to endorse passive SI, and agreed to contact staff before acting on harmful thoughts. Pt presented with depressed affect and mood, was calm and cooperative,answered questions logically and coherently during admission interview and assessment. VS monitored and recorded. Skin check performed with RN. Belongings searched and secured in locker. Admission paperwork completed and signed. Verbal understanding expressed. Pt oriented to the unit. Q 15 min checks initiated for safety. Pt provided with meal try and po fluids.

## 2024-10-22 NOTE — Group Note (Signed)
 Date:  10/22/2024 Time:  2:40 PM  Group Topic/Focus:  Emotional Education:   The focus of this group is to discuss what feelings/emotions are, and how they are experienced. Wellness Toolbox:   The focus of this group is to discuss various aspects of wellness, balancing those aspects and exploring ways to increase the ability to experience wellness.  Patients will create a wellness toolbox for use upon discharge.    Participation Level:  Did Not Attend  Melinda Jimenez 10/22/2024, 2:40 PM

## 2024-10-22 NOTE — Tx Team (Signed)
 Initial Treatment Plan 10/22/2024 2:31 PM Melinda Jimenez FMW:[id number]    PATIENT STRESSORS: Financial difficulties   Other: homelessness     PATIENT STRENGTHS: Average or above average intelligence  Capable of independent living  Education Administrator  Motivation for treatment/growth  Physical Health    PATIENT IDENTIFIED PROBLEMS: Suicidal ideation    Hopelessness    homeless             DISCHARGE CRITERIA:  Improved stabilization in mood, thinking, and/or behavior Reduction of life-threatening or endangering symptoms to within safe limits Verbal commitment to aftercare and medication compliance  PRELIMINARY DISCHARGE PLAN: Outpatient therapy Placement in alternative living arrangements  PATIENT/FAMILY INVOLVEMENT: This treatment plan has been presented to and reviewed with the patient, Melinda Jimenez,   The patient has been given the opportunity to ask questions and make suggestions.  Melinda GORMAN Acosta, RN 10/22/2024, 2:31 PM

## 2024-10-22 NOTE — Progress Notes (Signed)
(  Sleep Hours) - (Any PRNs that were needed, meds refused, or side effects to meds)- None (Any disturbances and when (visitation, over night)- Pt was very irritable. Witnessed pt speaking to herself while alone in her bedroom. (Concerns raised by the patient)- None (SI/HI/AVH)- Passive SI, contracts for safety. Denies HI/AVH.

## 2024-10-22 NOTE — ED Notes (Signed)
 Patient is here  voluntary pt is not IVC FOR NOW

## 2024-10-22 NOTE — ED Notes (Signed)
 Call  safe  tx

## 2024-10-22 NOTE — Progress Notes (Signed)
 Pt has been accepted to [facility] on 10/22/2024 Bed assignment: 402-01  Pt meets inpatient criteria per: Richerd Ivans NP  Attending Physician will be: Dr. Prentis    Report can be called to: -Adult unit: [phone]  Pt can arrive after RN WILL UPDATE   Care Team Notified: [facility] Cherylynn Ernst RN, Jerel Gravely NP  [place] Loriene Taunton LCSW  10/22/2024 9:23 AM

## 2024-10-22 NOTE — ED Notes (Signed)
 Attempted to call RN [place] receiving pt, will call back.

## 2024-10-22 NOTE — Group Note (Signed)
 Date:  10/22/2024 Time:  2:25 PM  Group Topic/Focus:  Patients participated in a psychoeducational and process-oriented group focused on grief. Group members were encouraged to identify personal experiences of loss, discuss emotional responses related to grief, and explore how grief has impacted their daily functioning. Patients practiced expressing thoughts and feelings in a supportive group setting and identified healthy coping strategies and support systems related to the grieving process. Participation included listening to peers, sharing as able, and engaging in guided discussion.    Participation Level:  Did Not Attend  Vena Mais 10/22/2024, 2:25 PM

## 2024-10-22 NOTE — Plan of Care (Signed)
   Problem: Education: Goal: Knowledge of [facility] Education information/materials will improve Outcome: Progressing Goal: Verbalization of understanding the information provided will improve Outcome: Progressing
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
## 2024-10-23 ENCOUNTER — Encounter (HOSPITAL_COMMUNITY): Payer: Self-pay

## 2024-10-23 MED ORDER — ARIPIPRAZOLE 5 MG PO TABS: 5.0000 mg | ORAL_TABLET | Freq: Once | ORAL | Status: DC

## 2024-10-23 MED ORDER — ARIPIPRAZOLE 10 MG PO TABS
10.0000 mg | ORAL_TABLET | Freq: Every day | ORAL | Status: DC
  Filled 2024-10-23 (×3): qty 1

## 2024-10-23 MED ORDER — TRAZODONE HCL 50 MG PO TABS
50.0000 mg | ORAL_TABLET | Freq: Every day | ORAL | Status: DC
  Administered 2024-10-26: 50 mg via ORAL
  Filled 2024-10-23 (×6): qty 1

## 2024-10-23 MED ORDER — HYDROXYZINE HCL 25 MG PO TABS
25.0000 mg | ORAL_TABLET | Freq: Three times a day (TID) | ORAL | Status: DC | PRN
  Filled 2024-10-23 (×2): qty 1

## 2024-10-23 MED ORDER — FERROUS SULFATE 325 (65 FE) MG PO TBEC
325.0000 mg | DELAYED_RELEASE_TABLET | Freq: Every day | ORAL | Status: DC
  Filled 2024-10-23 (×2): qty 1

## 2024-10-23 MED ORDER — TRAZODONE HCL 50 MG PO TABS: 50.0000 mg | ORAL_TABLET | Freq: Every day | ORAL | Status: DC

## 2024-10-23 MED ORDER — NIFEDIPINE ER OSMOTIC RELEASE 30 MG PO TB24
30.0000 mg | ORAL_TABLET | Freq: Every day | ORAL | Status: DC
  Administered 2024-10-23 – 2024-10-28 (×3): 30 mg via ORAL
  Filled 2024-10-23 (×7): qty 1

## 2024-10-23 MED ORDER — ALBUTEROL SULFATE HFA 108 (90 BASE) MCG/ACT IN AERS: 2.0000 | INHALATION_SPRAY | RESPIRATORY_TRACT | Status: DC | PRN

## 2024-10-23 NOTE — Group Note (Signed)
 Date:  10/23/2024 Time:  10:35 AM  Group Topic/Focus:  The Recreation Therapist facilitated a therapeutic activity group focused on promoting social interaction, emotional regulation, and positive leisure skills. Patients were encouraged to participate at their own comfort level while practicing appropriate communication, cooperation, and coping strategies. The activity provided an opportunity for stress reduction, engagement, and expression in a structured and supportive environment.  Participation Level:  Did Not Attend  Melinda Jimenez 10/23/2024, 10:35 AM

## 2024-10-23 NOTE — BH IP Treatment Plan (Signed)
 Interdisciplinary Treatment and Diagnostic Plan Update  10/23/2024 Time of Session: 10:15 AM LORI-ANN LINDFORS MRN: [id number]  Principal Diagnosis: MDD (major depressive disorder), recurrent severe, without psychosis (HCC)  Secondary Diagnoses: Principal Problem:   MDD (major depressive disorder), recurrent severe, without psychosis (HCC)   Current Medications:  Current Facility-Administered Medications  Medication Dose Route Frequency Provider Last Rate Last Admin   acetaminophen  (TYLENOL ) tablet 650 mg  650 mg Oral Q6H PRN Mannie Jerel PARAS, NP       alum & mag hydroxide-simeth (MAALOX/MYLANTA) 200-200-20 MG/5ML suspension 30 mL  30 mL Oral Q4H PRN Mannie Jerel PARAS, NP       haloperidol  (HALDOL ) tablet 5 mg  5 mg Oral TID PRN Mannie Jerel PARAS, NP       And   diphenhydrAMINE  (BENADRYL ) capsule 50 mg  50 mg Oral TID PRN Mannie Jerel PARAS, NP       haloperidol  lactate (HALDOL ) injection 10 mg  10 mg Intramuscular TID PRN Mannie Jerel PARAS, NP       And   diphenhydrAMINE  (BENADRYL ) injection 50 mg  50 mg Intramuscular TID PRN Mannie Jerel PARAS, NP       And   LORazepam  (ATIVAN ) injection 2 mg  2 mg Intramuscular TID PRN Mannie Jerel PARAS, NP       haloperidol  lactate (HALDOL ) injection 5 mg  5 mg Intramuscular TID PRN Mannie Jerel PARAS, NP       And   diphenhydrAMINE  (BENADRYL ) injection 50 mg  50 mg Intramuscular TID PRN Mannie Jerel PARAS, NP       And   LORazepam  (ATIVAN ) injection 2 mg  2 mg Intramuscular TID PRN Mannie Jerel PARAS, NP       magnesium  hydroxide (MILK OF MAGNESIA) suspension 30 mL  30 mL Oral Daily PRN Mannie Jerel PARAS, NP       nicotine  (NICODERM CQ  - dosed in mg/24 hours) patch 21 mg  21 mg Transdermal Q0600 Mannie Jerel PARAS, NP       PTA Medications: Medications Prior to Admission  Medication Sig Dispense Refill Last Dose/Taking   acetaminophen  (TYLENOL ) 500 MG tablet Take 1,000 mg by mouth 2 (two) times daily as needed for headache or fever (pain).      albuterol   (VENTOLIN  HFA) 108 (90 Base) MCG/ACT inhaler Inhale 2 puffs into the lungs every 4 (four) hours as needed for wheezing or shortness of breath.      amoxicillin  (AMOXIL ) 500 MG capsule Take 500 mg by mouth daily as needed (head, tooth pain).      ARIPiprazole  (ABILIFY ) 15 MG tablet Take 1 tablet (15 mg total) by mouth daily. 14 tablet 0    hydrOXYzine  (ATARAX ) 25 MG tablet Take 1 tablet (25 mg total) by mouth every 8 (eight) hours as needed for anxiety.      NIFEdipine  (ADALAT  CC) 30 MG 24 hr tablet Take 1 tablet (30 mg total) by mouth daily. 30 tablet 0     Patient Stressors: Financial difficulties   Other: homelessness    Patient Strengths: Average or above average intelligence  Capable of independent living  Land for treatment/growth  Physical Health   Treatment Modalities: Medication Management, Group therapy, Case management,  1 to 1 session with clinician, Psychoeducation, Recreational therapy.   Physician Treatment Plan for Primary Diagnosis: MDD (major depressive disorder), recurrent severe, without psychosis (HCC) Long Term Goal(s):     Short Term Goals:    Medication Management:  Evaluate patient's response, side effects, and tolerance of medication regimen.  Therapeutic Interventions: 1 to 1 sessions, Unit Group sessions and Medication administration.  Evaluation of Outcomes: Not Progressing  Physician Treatment Plan for Secondary Diagnosis: Principal Problem:   MDD (major depressive disorder), recurrent severe, without psychosis (HCC)  Long Term Goal(s):     Short Term Goals:       Medication Management: Evaluate patient's response, side effects, and tolerance of medication regimen.  Therapeutic Interventions: 1 to 1 sessions, Unit Group sessions and Medication administration.  Evaluation of Outcomes: Not Progressing   RN Treatment Plan for Primary Diagnosis: MDD (major depressive disorder), recurrent severe, without  psychosis (HCC) Long Term Goal(s): Knowledge of disease and therapeutic regimen to maintain health will improve  Short Term Goals: Ability to remain free from injury will improve, Ability to verbalize frustration and anger appropriately will improve, Ability to demonstrate self-control, Ability to participate in decision making will improve, Ability to verbalize feelings will improve, Ability to disclose and discuss suicidal ideas, Ability to identify and develop effective coping behaviors will improve, and Compliance with prescribed medications will improve  Medication Management: RN will administer medications as ordered by provider, will assess and evaluate patient's response and provide education to patient for prescribed medication. RN will report any adverse and/or side effects to prescribing provider.  Therapeutic Interventions: 1 on 1 counseling sessions, Psychoeducation, Medication administration, Evaluate responses to treatment, Monitor vital signs and CBGs as ordered, Perform/monitor CIWA, COWS, AIMS and Fall Risk screenings as ordered, Perform wound care treatments as ordered.  Evaluation of Outcomes: Not Progressing   LCSW Treatment Plan for Primary Diagnosis: MDD (major depressive disorder), recurrent severe, without psychosis (HCC) Long Term Goal(s): Safe transition to appropriate next level of care at discharge, Engage patient in therapeutic group addressing interpersonal concerns.  Short Term Goals: Engage patient in aftercare planning with referrals and resources, Increase social support, Increase ability to appropriately verbalize feelings, Increase emotional regulation, Facilitate acceptance of mental health diagnosis and concerns, Facilitate patient progression through stages of change regarding substance use diagnoses and concerns, Identify triggers associated with mental health/substance abuse issues, and Increase skills for wellness and recovery  Therapeutic Interventions:  Assess for all discharge needs, 1 to 1 time with Social worker, Explore available resources and support systems, Assess for adequacy in community support network, Educate family and significant other(s) on suicide prevention, Complete Psychosocial Assessment, Interpersonal group therapy.  Evaluation of Outcomes: Not Progressing   Progress in Treatment: Attending groups: No. Participating in groups: No. Taking medication as prescribed: patient hasn't been prescribed any medications yet Toleration medication: N/A Family/Significant other contact made: No, will contact:  consents are pending Patient understands diagnosis: Yes. Discussing patient identified problems/goals with staff: Yes. Medical problems stabilized or resolved: Yes. Denies suicidal/homicidal ideation: Yes. Issues/concerns per patient self-inventory: No.  New problem(s) identified:  No  New Short Term/Long Term Goal(s):    medication stabilization, elimination of SI thoughts, development of comprehensive mental wellness plan.    Patient Goals:  patient was symptomatic and couldn't participate in the treatment team meeting  Discharge Plan or Barriers:  Patient recently admitted. CSW will continue to follow and assess for appropriate referrals and possible discharge planning.   Reason for Continuation of Hospitalization: Depression Homicidal ideation Medication stabilization Suicidal ideation  Estimated Length of Stay:  5 - 7 days  Last 3 [place] Suicide Severity Risk Score: Flowsheet Row [facility] 10[facility]21/2024 in [facility] ED from 10/18/2024 in [facility]  C-SSRS RISK CATEGORY High Risk High Risk High Risk    Last [facility] 2/9 Scores:    02/17/2020   10:08 AM 05/14/2017    1:05 PM  Depression screen PHQ 2/9  Decreased Interest 3 0  Down, Depressed, Hopeless 3 0   PHQ - 2 Score 6 0  Altered sleeping 3 0  Tired, decreased energy 3 3  Change in appetite 1 0  Feeling bad or failure about yourself  3 0  Trouble concentrating 3 0  Moving slowly or fidgety/restless 2 0  Suicidal thoughts 2 0  PHQ-9 Score 23  3      Data saved with a previous flowsheet row definition    Scribe for Treatment Team: Abdalla Naramore O Kasidee Voisin, LCSW 10/23/2024 1:33 PM

## 2024-10-23 NOTE — Group Note (Signed)
 Date:  10/23/2024 Time:  5:08 PM  Group Topic/Focus:  Emotional Hygiene Video Group - Description Patients attended a psychoeducational group focused on emotional hygiene. The group involved viewing a mental health-related video addressing emotional awareness, coping strategies, and the importance of maintaining emotional well-being. Patients were encouraged to reflect on the content and its relevance to daily functioning and recovery. The group provided education in a structured and supportive environment.    Participation Level:  Did Not Attend    Melinda Jimenez 10/23/2024, 5:08 PM

## 2024-10-23 NOTE — Group Note (Signed)
 Recreation Therapy Group Note   Group Topic:General Recreation  Group Date: 10/23/2024 Start Time: 0930 End Time: 1010 Facilitators: Aalani Aikens-McCall, LRT,CTRS Location: [address] Dayroom   Group Topic/Focus: General Recreation   Goal Area(s) Addresses:  Patient will use appropriate interactions in play with peers.   Patient will appropriately follow the rules of the activity.  Behavioral Response:    Intervention: Competitive Play  Activity: Grab the Mic. LRT flips over a card with a word on it and the patient had to think of a lyric with that word in it. Each card has a number in the corner that represents the points for that word. The person who could think of lyric, races to the mic, picks it up and sings the lyrics. The person with the most points wins the game.     Affect/Mood: N/A   Participation Level: Did not attend    Clinical Observations/Individualized Feedback:      Plan: Continue to engage patient in RT group sessions 2-3x/week.   Melinda Jimenez, LRT,CTRS 10/23/2024 12:58 PM

## 2024-10-23 NOTE — Progress Notes (Signed)
" °   10/23/24 0900  Psych Admission Type (Psych Patients Only)  Admission Status Voluntary  Psychosocial Assessment  Patient Complaints Irritability;Self-harm thoughts  Eye Contact Brief  Facial Expression Flat  Affect Depressed;Irritable  Speech Tangential  Interaction Assertive  Motor Activity Other (Comment) (WNL)  Appearance/Hygiene Unremarkable  Behavior Characteristics Irritable  Mood Labile;Depressed;Preoccupied  Thought Process  Coherency Disorganized;Tangential  Content Preoccupation  Delusions None reported or observed  Perception Hallucinations  Hallucination None reported or observed  Judgment Poor  Confusion None  Danger to Self  Current suicidal ideation? Passive  Description of Suicide Plan no plan  Self-Injurious Behavior Some self-injurious ideation observed or expressed.  No lethal plan expressed   Agreement Not to Harm Self Yes  Description of Agreement verbal  Danger to Others  Danger to Others None reported or observed  Danger to Others Abnormal  Harmful Behavior to others No threats or harm toward other people  Destructive Behavior No threats or harm toward property    "

## 2024-10-23 NOTE — BHH Counselor (Signed)
 Adult Comprehensive Assessment  Patient ID: Melinda Jimenez, female   DOB: 10/30/1985, 39 y.o.   MRN: [id number]  The LCSWA attempted to complete the assessment with the patient. The patient refused to complete the assessment. Stating that she don't need a child psychotherapist so there was no need for the LCSWA to return. Another attempt will be made at another time.     Roselyn GORMAN Lento. 10/23/2024

## 2024-10-23 NOTE — Plan of Care (Signed)

## 2024-10-23 NOTE — H&P (Signed)
 " Psychiatric Admission Assessment Adult  Patient Identification: Melinda Jimenez  MRN:  [id number]  Date of Evaluation:  10/23/2024  Chief Complaint:  MDD (major depressive disorder), recurrent severe, without psychosis (HCC) [F33.2]  Principal Diagnosis: MDD (major depressive disorder), recurrent severe, without psychosis (HCC)  Diagnosis:  Principal Problem:   MDD (major depressive disorder), recurrent severe, without psychosis (HCC)  History of Present Illness: This is one of several psychiatric admission/evaluations in this [facility] for this 39 year old AA female with hx of mental illness, chronic. Admitted from the [facility] hospital ED with complaint of suicidal ideations. Chart review indicated that patient presented to the ED with complaint of dizziness & headache pain, and during her medical evaluation for her presenting medical symptoms, patient reported feeling suicidal. She reported at the ED that she has been feeling depressed & was thinking about harming herself by taking bunch of pills. She was then transferred to the [facility] for further psychiatric evaluation/treatments. Patient was recently discharged from [facility] with a recommendation for an outpatient psychiatric follow-up visits & medication management with [facility]. During this evaluation, Melinda Jimenez reports,   Can you please let me be. I don't know how you guys are going to help me. I'm sad & depressed. This has been going on for a long time. I have been taking all my medicines like I'm suppose to take them. I don't know what I need to do. I just go by what the universe wants from me. If they don't let me sleep, I won't sleep. I sleep when they want me to sleep. I stay up when ever they want me to stay up. Everything you need to know is on the record. Go read it, bye.  Objective: Melinda Jimenez presents during this evaluation alert & awake. She presents with a flat affect, making a fair eye contact. She was uncooperative during this evaluation. She  presents somewhat delusional. She says for this provider should go & read what's in her record. She stopped talking to the provider after she says, bye. She is currently resumed on Abilify . See the treatment plan. She is started on ferrous sulfate  325 mg for anemia & Nifedipine  for HTN. She is resumed on Abilify  for her mood.  Associated Signs/Symptoms:  Depression Symptoms:  depressed mood, hopelessness, suicidal thoughts without plan,  (Hypo) Manic Symptoms:  Denies any racing thoughts, mood swings or manic symptoms.  Anxiety Symptoms:  Excessive Worry,  Psychotic Symptoms:  Delusional thinking present. Patient says she does what ever the universe wants her to do at anytime.  PTSD Symptoms: NA  Did the patient present with any abnormal findings indicating the need for additional neurological or psychological testing?  No  Total Time spent with patient: 1.5 hours   Past Psychiatric History: Major depressive disorder, borderline personality disorder. Previous Psych Diagnoses: MDD, bipolar disorder, and generalized anxiety. Prior inpatient treatment: X 2, most recently at [facility] in June 2025 Current/prior outpatient treatment: Denies Prior rehab hx: Denies Psychotherapy hx: Yes History of suicide: Denies History of homicide or aggression: Denies Psychiatric medication history: Yes, Abilify  trazodone  and hydroxyzine  Psychiatric medication compliance history: Noncompliance Neuromodulation history: denies Current Psychiatrist: Denies Current therapist: Denies  Is the patient at risk to self? Yes.  (Passive ideations. Denies any plans or intent. Has the patient been a risk to self in the past 6 months? Yes.    Has the patient been a risk to self within the distant past? Yes.    Is the patient a risk to  others? No.  Has the patient been a risk to others in the past 6 months? No.  Has the patient been a risk to others within the distant past? No.   Columbia Scale:  Flowsheet Row  [facility]) [facility] from 10/21/2024 in [facility] ED from 10/18/2024 in [facility]  C-SSRS RISK CATEGORY High Risk High Risk High Risk    Prior Inpatient Therapy: Yes.   If yes, describe: [facility].   Prior Outpatient Therapy: Yes.   If yes, describe: [facility].   Alcohol Screening: 1. How often do you have a drink containing alcohol?: Never 2. How many drinks containing alcohol do you have on a typical day when you are drinking?: 1 or 2 3. How often do you have six or more drinks on one occasion?: Never AUDIT-C Score: 0 4. How often during the last year have you found that you were not able to stop drinking once you had started?: Never 5. How often during the last year have you failed to do what was normally expected from you because of drinking?: Never 6. How often during the last year have you needed a first drink in the morning to get yourself going after a heavy drinking session?: Never 7. How often during the last year have you had a feeling of guilt of remorse after drinking?: Never 8. How often during the last year have you been unable to remember what happened the night before because you had been drinking?: Never 9. Have you or someone else been injured as a result of your drinking?: No 10. Has a relative or friend or a doctor or another health worker been concerned about your drinking or suggested you cut down?: No Alcohol Use Disorder Identification Test Final Score (AUDIT): 0  Substance Abuse History in the last 12 months:  Yes.    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   Carpal tunnel syndrome    Cesarean delivery delivered 10/10/2020   Chlamydia    Chronic hypertension    Chronic post-traumatic stress disorder (PTSD) 03/10/2024   COVID-19 09/12/2020   Genital HSV    Last OB: > 59yr ago; Valtrex  500 mg po prn outbreaks   GERD (gastroesophageal reflux disease)    GERD without esophagitis 01/20/2013   Gestational diabetes    diet controlled   Goiter, unspecified 04/18/2010   Centricity Description: THYROMEGALY  Qualifier: Diagnosis of   By: Lelon RIGGERS, Scott      Centricity Description: GOITER  Qualifier: Diagnosis of   By: Lelon RIGGERS, Scott      Replacing diagnoses that were inactivated after the 12/31/22 regulatory import     H/O: cesarean section 09/27/2016   last C-section for active HSV, C-section X1: 05/20/13; records in epic [facility]  TOLAC desired, signed 09/27/16      Headache(784.0)    Herpes genitalis in women 04/18/2010   Qualifier: Diagnosis of   By: Lelon RIGGERS, Scott         IBS (irritable bowel syndrome)    Infection    UTI   Intrauterine pregnancy 03/22/2020  EDC 11/14/2020     Panic attack    Premature rupture of membranes (PROM), onset of labor after 24 hours, antepartum, preterm, third trimester 10/10/2020   Pulmonary embolus (HCC) 10/18/2020   Thyroid  disease    Trichomonas    Vitamin D  deficiency 03/10/2024    Past Surgical History:  Procedure Laterality Date   CESAREAN SECTION N/A 05/20/2013   Procedure: CESAREAN SECTION;  Surgeon: Carlin DELENA Centers, MD;  Location: [facility] ORS;  Service: Obstetrics;  Laterality: N/A;   CESAREAN SECTION N/A 04/06/2017   Procedure: CESAREAN SECTION;  Surgeon: Lorence Ozell CROME, MD;  Location: [facility] BIR[facility]ING SU[facility];  Service: Obstetrics;  Laterality: N/A;   CESAREAN SECTION  10/10/2020   Procedure: CESAREAN SECTION;  Surgeon: Zina Jerilynn DELENA, MD;  Location: [facility] LD ORS;  Service: Obstetrics;;   INDUCED ABORTION  2009   VAGINAL DELIVERY  2007   Family History:  Family  History  Problem Relation Age of Onset   Depression Mother    Anxiety disorder Mother    Breast cancer Other    Bipolar disorder Brother    Diabetes Maternal Grandmother    Cancer Maternal Grandmother    Hypertension Maternal Grandmother    Hypertension Paternal Grandmother    Autism Other    Schizophrenia Other    Family Psychiatric  History: Positive for autism & schizophrenia. Major depression: Mother.  Bipolar disorder: Brother.  Tobacco Screening: Tobacco Use History[1]  BH Tobacco Counseling     Are you interested in Tobacco Cessation Medications?  N/A, patient does not use tobacco products Counseled patient on smoking cessation:  N/A, patient does not use tobacco products Reason Tobacco Screening Not Completed: No value filed.       Social History: Patient reports, I'm single, that's about it. Social History   Substance and Sexual Activity  Alcohol Use No   Comment: Twice a month     Social History   Substance and Sexual Activity  Drug Use No    Additional Social History:  Allergies:  Allergies[2] Lab Results:  Results for orders placed or performed during the hospital encounter of 10/21/24 (from the past 48 hours)  Urine Drug Screen     Status: None   Collection Time: 10/21/24  9:04 PM  Result Value Ref Range   Opiates NEGATIVE NEGATIVE   Cocaine NEGATIVE NEGATIVE   Benzodiazepines NEGATIVE NEGATIVE   Amphetamines NEGATIVE NEGATIVE   Tetrahydrocannabinol NEGATIVE NEGATIVE   Barbiturates NEGATIVE NEGATIVE   Methadone Scn, Ur NEGATIVE NEGATIVE   Fentanyl  NEGATIVE NEGATIVE    Comment: (NOTE) Drug screen is for Medical Purposes only. Positive results are preliminary only. If confirmation is needed, notify lab within 5 days.  Drug Class                 Cutoff (ng/mL) Amphetamine and metabolites 1000 Barbiturate and metabolites 200 Benzodiazepine              200 Opiates and metabolites     300 Cocaine and metabolites     300 THC                          50 Fentanyl                     5 Methadone                   300  Trazodone  is metabolized in vivo to several metabolites,  including pharmacologically active  m-CPP, which is excreted in the  urine.  Immunoassay screens for amphetamines and MDMA have potential  cross-reactivity with these compounds and may provide false positive  result.  Performed at [facility], [address] [place], [place] [address]   CBC with Differential     Status: Abnormal   Collection Time: 10/21/24 10:16 PM  Result Value Ref Range   WBC 12.9 (H) 4.0 - 10.5 K/uL   RBC 4.98 3.87 - 5.11 MIL/uL   Hemoglobin 10.3 (L) 12.0 - 15.0 g/dL   HCT 65.9 (L) 63.9 - 53.9 %   MCV 68.3 (L) 80.0 - 100.0 fL   MCH 20.7 (L) 26.0 - 34.0 pg   MCHC 30.3 30.0 - 36.0 g/dL   RDW 81.0 (H) 88.4 - 84.4 %   Platelets 361 150 - 400 K/uL    Comment: REPEATED TO VERIFY   nRBC 0.0 0.0 - 0.2 %   Neutrophils Relative % 56 %   Neutro Abs 7.3 1.7 - 7.7 K/uL   Lymphocytes Relative 37 %   Lymphs Abs 4.7 (H) 0.7 - 4.0 K/uL   Monocytes Relative 6 %   Monocytes Absolute 0.8 0.1 - 1.0 K/uL   Eosinophils Relative 1 %   Eosinophils Absolute 0.1 0.0 - 0.5 K/uL   Basophils Relative 0 %   Basophils Absolute 0.0 0.0 - 0.1 K/uL   WBC Morphology MORPHOLOGY UNREMARKABLE    RBC Morphology MORPHOLOGY UNREMARKABLE    Smear Review Normal platelet morphology    Immature Granulocytes 0 %   Abs Immature Granulocytes 0.03 0.00 - 0.07 K/uL    Comment: Performed at [facility], [address] [place], [place] [address]  Comprehensive metabolic panel     Status: Abnormal   Collection Time: 10/21/24 10:16 PM  Result Value Ref Range   Sodium 138 135 - 145 mmol/L   Potassium 3.7 3.5 - 5.1 mmol/L   Chloride 101 98 - 111 mmol/L   CO2 25 22 - 32 mmol/L   Glucose, Bld 115 (H) 70 - 99 mg/dL    Comment: Glucose reference range applies only to samples taken after fasting for at least 8 hours.   BUN 14 6 - 20 mg/dL   Creatinine, Ser 9.32  0.44 - 1.00 mg/dL   Calcium  9.5 8.9 - 10.3 mg/dL   Total Protein 8.4 (H) 6.5 - 8.1 g/dL   Albumin 4.1 3.5 - 5.0 g/dL   AST 17 15 - 41 U/L   ALT 12 0 - 44 U/L   Alkaline Phosphatase 83 38 - 126 U/L   Total Bilirubin 0.4 0.0 - 1.2 mg/dL   GFR, Estimated >39 >39 mL/min    Comment: (NOTE) Calculated using the CKD-EPI Creatinine Equation (2021)    Anion gap 12 5 - 15    Comment: Performed at [facility], [address], [place], [place] [address]  Ethanol     Status: None   Collection Time: 10/21/24 10:16 PM  Result Value Ref Range   Alcohol, Ethyl (B) <15 <15 mg/dL    Comment: (NOTE) For medical purposes only. Performed at [facility], [address], [place], [place] [address]   hCG, serum, qualitative     Status: None   Collection Time: 10/21/24 10:16 PM  Result Value Ref Range   Preg, Serum NEGATIVE NEGATIVE    Comment:        THE SENSITIVITY OF THIS METHODOLOGY IS >10 mIU/mL. Performed at [facility]  [facility], [address], [place], [place] [address]   Troponin T, High Sensitivity     Status: None   Collection Time: 10/21/24 10:16 PM  Result Value Ref Range   Troponin T High Sensitivity <6 0 - 19 ng/L    Comment: (NOTE) Biotin concentrations > 1000 ng/mL falsely decrease TnT results.  Serial cardiac troponin measurements are suggested.  Refer to the Links section for chest pain algorithms and additional  guidance. Performed at [facility], [address], [place], [place] [address]   Troponin T, High Sensitivity     Status: None   Collection Time: 10/22/24 12:24 AM  Result Value Ref Range   Troponin T High Sensitivity <6 0 - 19 ng/L    Comment: (NOTE) Biotin concentrations > 1000 ng/mL falsely decrease TnT results.  Serial cardiac troponin measurements are suggested.  Refer to the Links section for chest pain algorithms and additional  guidance. Performed at [facility], [address], [place], [place] [address]    Blood Alcohol  level:  Lab Results  Component Value Date   [facility] <15 10/21/2024   ETH <15 09/07/2024   Metabolic Disorder Labs:  Lab Results  Component Value Date   HGBA1C 6.0 (H) 08/12/2024   MPG 125.5 08/12/2024   MPG 122.63 07/28/2024   No results found for: PROLACTIN Lab Results  Component Value Date   CHOL 126 08/12/2024   TRIG 93 08/12/2024   HDL 35 (L) 08/12/2024   CHOLHDL 3.6 08/12/2024   VLDL 19 08/12/2024   LDLCALC 72 08/12/2024   LDLCALC 71 07/29/2024   Current Medications: Current Facility-Administered Medications  Medication Dose Route Frequency Provider Last Rate Last Admin   acetaminophen  (TYLENOL ) tablet 650 mg  650 mg Oral Q6H PRN Mannie Jerel PARAS, NP       alum & mag hydroxide-simeth (MAALOX/MYLANTA) 200-200-20 MG/5ML suspension 30 mL  30 mL Oral Q4H PRN Mannie Jerel PARAS, NP       haloperidol  (HALDOL ) tablet 5 mg  5 mg Oral TID PRN Mannie Jerel PARAS, NP       And   diphenhydrAMINE  (BENADRYL ) capsule 50 mg  50 mg Oral TID PRN Mannie Jerel PARAS, NP       haloperidol  lactate (HALDOL ) injection 10 mg  10 mg Intramuscular TID PRN Mannie Jerel PARAS, NP       And   diphenhydrAMINE  (BENADRYL ) injection 50 mg  50 mg Intramuscular TID PRN Mannie Jerel PARAS, NP       And   LORazepam  (ATIVAN ) injection 2 mg  2 mg Intramuscular TID PRN Mannie Jerel PARAS, NP       haloperidol  lactate (HALDOL ) injection 5 mg  5 mg Intramuscular TID PRN Mannie Jerel PARAS, NP       And   diphenhydrAMINE  (BENADRYL ) injection 50 mg  50 mg Intramuscular TID PRN Mannie Jerel PARAS, NP       And   LORazepam  (ATIVAN ) injection 2 mg  2 mg Intramuscular TID PRN Mannie Jerel PARAS, NP       magnesium  hydroxide (MILK OF MAGNESIA) suspension 30 mL  30 mL Oral Daily PRN Mannie Jerel PARAS, NP       nicotine  (NICODERM CQ  - dosed in mg/24 hours) patch 21 mg  21 mg Transdermal Q0600 Mannie Jerel PARAS, NP       PTA Medications: Medications Prior to Admission  Medication Sig Dispense Refill Last Dose/Taking   acetaminophen   (TYLENOL ) 500 MG tablet Take 1,000 mg  by mouth 2 (two) times daily as needed for headache or fever (pain).      albuterol  (VENTOLIN  HFA) 108 (90 Base) MCG/ACT inhaler Inhale 2 puffs into the lungs every 4 (four) hours as needed for wheezing or shortness of breath.      amoxicillin  (AMOXIL ) 500 MG capsule Take 500 mg by mouth daily as needed (head, tooth pain).      ARIPiprazole  (ABILIFY ) 15 MG tablet Take 1 tablet (15 mg total) by mouth daily. 14 tablet 0    hydrOXYzine  (ATARAX ) 25 MG tablet Take 1 tablet (25 mg total) by mouth every 8 (eight) hours as needed for anxiety.      NIFEdipine  (ADALAT  CC) 30 MG 24 hr tablet Take 1 tablet (30 mg total) by mouth daily. 30 tablet 0    AIMS:  ,  ,  ,  ,  ,  ,    Musculoskeletal: Strength & Muscle Tone: within normal limits Gait & Station: normal Patient leans: N/A  Psychiatric Specialty Exam:  Presentation  General Appearance:  Appropriate for Environment  Eye Contact: Good  Speech: Clear and Coherent  Speech Volume: Normal  Handedness: Right   Mood and Affect  Mood: Depressed  Affect: Congruent   Thought Process  Thought Processes: Coherent  Duration of Psychotic Symptoms:N/A  Past Diagnosis of Schizophrenia or Psychoactive disorder: No  Descriptions of Associations:Intact  Orientation:None  Thought Content:WDL  Hallucinations:No data recorded Ideas of Reference:None  Suicidal Thoughts:No data recorded Homicidal Thoughts:No data recorded  Sensorium  Memory: Immediate Good; Recent Fair; Remote Fair  Judgment: Fair  Insight: Fair   Art Therapist  Concentration: Good  Attention Span: Good  Recall: Good  Fund of Knowledge: Good  Language: Good   Psychomotor Activity  Psychomotor Activity:No data recorded  Assets  Assets: Communication Skills; Desire for Improvement   Sleep  Sleep:No data recorded Estimated Sleeping Duration (Last 24 Hours): 6.50-7.00 hours   Physical  Exam: Physical Exam Vitals and nursing note reviewed.  HENT:     Head: Normocephalic.     Nose: Nose normal.     Mouth/Throat:     Pharynx: Oropharynx is clear.  Cardiovascular:     Rate and Rhythm: Normal rate.     Pulses: Normal pulses.  Pulmonary:     Effort: Pulmonary effort is normal.  Genitourinary:    Comments: Deferred.                                                                                 Deferred.              Musculoskeletal:        General: Normal range of motion.     Cervical back: Normal range of motion.  Skin:    General: Skin is dry.  Neurological:     General: No focal deficit present.     Mental Status: She is alert and oriented to person, place, and time.    Review of Systems  Constitutional:  Negative for chills, diaphoresis and fever.  HENT:  Negative for congestion and sore throat.   Respiratory:  Negative for cough, shortness of breath and wheezing.   Cardiovascular:  Negative for chest  pain and palpitations.  Gastrointestinal:  Negative for abdominal pain, constipation, diarrhea, heartburn, nausea and vomiting.  Musculoskeletal:  Negative for joint pain and myalgias.  Skin:  Negative for itching and rash.  Neurological:  Negative for dizziness, tingling, tremors, sensory change, speech change, focal weakness, seizures, loss of consciousness, weakness and headaches.  Endo/Heme/Allergies:        See & review lists.  Psychiatric/Behavioral:  Positive for depression.    Blood pressure 137/86, pulse 94, temperature 98 F (36.7 C), temperature source Oral, resp. rate 16, height 5' 7 (1.702 m), weight 101.2 kg, SpO2 100%. Body mass index is 34.93 kg/m.  Treatment Plan Summary: Daily contact with patient to assess and evaluate symptoms and progress in treatment and Medication management.   Principal/active diagnoses.  Major depressive disorder,  recurrent episodes.  Medical issues.  Anemia. HTN.  Plan: The risks/benefits/side-effects/alternatives to the medications in use were discussed in detail with the patient and time was given for patient's questions. The patient consents to medication trial.   -Initiated Abilify  5 mg po once today for mood control.  -Continue Abilify  10 mg po daily for mood. -Continue Hydroxyzine  25 mg po tid prn for anxiety. -Continue Trazodone  50 mg po Q hs for insomnia.   Other PRNS -Continue Tylenol  650 mg every 6 hours PRN for mild pain -Continue Maalox 30 ml Q 4 hrs PRN for indigestion -Continue MOM 30 ml po Q 6 hrs for constipation  Safety and Monitoring: Voluntary admission to inpatient psychiatric unit for safety, stabilization and treatment Daily contact with patient to assess and evaluate symptoms and progress in treatment Patient's case to be discussed in multi-disciplinary team meeting Observation Level : q15 minute checks Vital signs: q12 hours Precautions: Safety  Discharge Planning: Social work and case management to assist with discharge planning and identification of hospital follow-up needs prior to discharge Estimated LOS: 5-7 days Discharge Concerns: Need to establish a safety plan; Medication compliance and effectiveness Discharge Goals: Return home with outpatient referrals for mental health follow-up including medication management/psychotherapy  Observation Level/Precautions:  15 minute checks  Laboratory:  Per ED. Current lab results reviewed.  Psychotherapy: Enrolled in the group sessions.  Medications:  See MAR.  Consultations:  As needed.  Discharge Concerns:  Safety, mood stability.  Estimated LOS: 3-5 days.  Other: NA   Physician Treatment Plan for Primary Diagnosis: MDD (major depressive disorder), recurrent severe, without psychosis (HCC) Long Term Goal(s): Improvement in symptoms so as ready for discharge  Short Term Goals: Ability to identify changes in  lifestyle to reduce recurrence of condition will improve, Ability to verbalize feelings will improve, Ability to disclose and discuss suicidal ideas, and Ability to demonstrate self-control will improve  Physician Treatment Plan for Secondary Diagnosis: Principal Problem:   MDD (major depressive disorder), recurrent severe, without psychosis (HCC)  Long Term Goal(s): Improvement in symptoms so as ready for discharge  Short Term Goals: Ability to identify and develop effective coping behaviors will improve, Ability to maintain clinical measurements within normal limits will improve, Compliance with prescribed medications will improve, and Ability to identify triggers associated with substance abuse/mental health issues will improve  I certify that inpatient services furnished can reasonably be expected to improve the patient's condition.    Mac Bolster, NP, pmhnp, fnp-bc. 1/23/20263:01 PM     [1]  Social History Tobacco Use  Smoking Status Passive Smoke Exposure - Never Smoker  Smokeless Tobacco Never  Tobacco Comments   was not a daily smoker, tried it  [  2]  Allergies Allergen Reactions   Latex Itching   Zithromax  [Azithromycin ] Hives and Nausea And Vomiting   Zofran  [Ondansetron ] Nausea Only   "

## 2024-10-23 NOTE — Group Note (Signed)
 Date:  10/23/2024 Time:  1:10 PM  Group Topic/Focus:  Group Description: The Recreation Therapist facilitated a Social Wellness group focused on increasing awareness of healthy social interactions, communication, boundaries, and shared experiences. Patients participated in an interactive activity that encouraged self-reflection, peer engagement, and recognition of common social challenges. The group promoted connection, respect, and appropriate social behaviors in a structured and supportive environment.   Participation Level:  Did Not Attend  Jaretzi Droz 10/23/2024, 1:10 PM

## 2024-10-23 NOTE — [facility] Suicide Risk Assessment (Signed)
 Suicide Risk Assessment  Admission Assessment    [facility] Admission Suicide Risk Assessment   Nursing information obtained from:  Patient  Demographic factors:  Low socioeconomic status, Living alone, Unemployed  Current Mental Status:  Suicidal ideation indicated by patient  Loss Factors:  Financial problems / change in socioeconomic status  Historical Factors:  Prior suicide attempts  Risk Reduction Factors:  NA  Total Time spent with patient: 1.5 hours  Principal Problem: MDD (major depressive disorder), recurrent severe, without psychosis (HCC)  Diagnosis:  Principal Problem:   MDD (major depressive disorder), recurrent severe, without psychosis (HCC)  Subjective Data: See H&P.  Continued Clinical Symptoms:  Alcohol Use Disorder Identification Test Final Score (AUDIT): 0 The Alcohol Use Disorders Identification Test, Guidelines for Use in Primary Care, Second Edition.  [employer] [facility]). Score between 0-7:  no or low risk or alcohol related problems. Score between 8-15:  moderate risk of alcohol related problems. Score between 16-19:  high risk of alcohol related problems. Score 20 or above:  warrants further diagnostic evaluation for alcohol dependence and treatment.  CLINICAL FACTORS:   Depression:   Hopelessness Personality Disorders:   Cluster B Unstable or Poor Therapeutic Relationship Previous Psychiatric Diagnoses and Treatments  Musculoskeletal: Strength & Muscle Tone: within normal limits Gait & Station: normal Patient leans: N/A  Psychiatric Specialty Exam:  Presentation  General Appearance:  Casual; Fairly Groomed  Eye Contact: Fair  Speech: Clear and Coherent; Normal Rate  Speech Volume: Normal  Handedness: Right   Mood and Affect  Mood: Depressed  Affect: Congruent; Flat   Thought Process  Thought Processes: Coherent  Descriptions of Associations:Intact  Orientation:Full (Time, Place and Person)  Thought  Content:Delusions  History of Schizophrenia/Schizoaffective disorder:No  Duration of Psychotic Symptoms:N/A  Hallucinations:Hallucinations: None  Ideas of Reference:Delusions (I do what ever the universe wants me to do.)  Suicidal Thoughts:Suicidal Thoughts: Yes, Passive SI Passive Intent and/or Plan: Without Intent; Without Plan; Without Means to Carry Out; Without Access to Means  Homicidal Thoughts:Homicidal Thoughts: No   Sensorium  Memory: Recent Fair; Remote Fair; Immediate Good  Judgment: Fair  Insight: Fair   Executive Functions  Concentration: Good  Attention Span: Good  Recall: Fair  Fund of Knowledge: Fair  Language: Good   Psychomotor Activity  Psychomotor Activity:Psychomotor Activity: Normal   Assets  Assets: Communication Skills; Desire for Improvement; Financial Resources/Insurance; Physical Health; Resilience  Sleep  Sleep:Sleep: Fair Number of Hours of Sleep: 6.5  Physical Exam: See H&P.  Blood pressure 137/86, pulse 94, temperature 98 F (36.7 C), temperature source Oral, resp. rate 16, height 5' 7 (1.702 m), weight 101.2 kg, SpO2 100%. Body mass index is 34.93 kg/m.  COGNITIVE FEATURES THAT CONTRIBUTE TO RISK:  Polarized thinking    SUICIDE RISK:   Moderate:  Frequent suicidal ideation with limited intensity, and duration, some specificity in terms of plans, no associated intent, good self-control, limited dysphoria/symptomatology, some risk factors present, and identifiable protective factors, including available and accessible social support.  PLAN OF CARE: See H&P.  I certify that inpatient services furnished can reasonably be expected to improve the patient's condition.   Mac Bolster, NP, pmhnp, fnp-bc. 10/23/2024, 3:26 PM

## 2024-10-23 NOTE — Group Note (Signed)
 Date:  10/23/2024 Time:  4:22 PM  Group Topic/Focus:  Coping With Mental Health Crisis:   The purpose of this group is to help patients identify strategies for coping with mental health crisis.  Group discusses possible causes of crisis and ways to manage them effectively.    Participation Level:  Did Not Attend  Participation Quality:    Affect:    Cognitive:    Insight:   Engagement in Group:    Modes of Intervention:    Additional Comments:    Asberry CROME Yousof Alderman 10/23/2024, 4:22 PM

## 2024-10-23 NOTE — Progress Notes (Signed)
" °   10/23/24 2152  Psych Admission Type (Psych Patients Only)  Admission Status Voluntary  Psychosocial Assessment  Patient Complaints Anxiety  Eye Contact Brief  Facial Expression Flat  Affect Appropriate to circumstance  Speech Logical/coherent  Interaction Assertive  Motor Activity Other (Comment) (WDL)  Appearance/Hygiene Unremarkable  Behavior Characteristics Appropriate to situation  Mood Depressed  Thought Process  Coherency Tangential  Content Preoccupation  Delusions None reported or observed  Perception Hallucinations  Hallucination None reported or observed  Judgment Poor  Confusion None  Danger to Self  Current suicidal ideation? Passive  Self-Injurious Behavior No self-injurious ideation or behavior indicators observed or expressed   Agreement Not to Harm Self Yes  Description of Agreement verbal  Danger to Others  Danger to Others None reported or observed  Danger to Others Abnormal  Harmful Behavior to others No threats or harm toward other people  Destructive Behavior No threats or harm toward property    "

## 2024-10-23 NOTE — Group Note (Signed)
 Date:  10/23/2024 Time:  9:20 AM  Group Topic/Focus:  Goals Group:   The focus of this group is to help patients establish daily goals to achieve during treatment and discuss how the patient can incorporate goal setting into their daily lives to aide in recovery. Orientation:   The focus of this group is to educate the patient on the purpose and policies of crisis stabilization and provide a format to answer questions about their admission.  The group details unit policies and expectations of patients while admitted.    Participation Level:  Did Not Attend   Hadessah Grennan 10/23/2024, 9:20 AM

## 2024-10-24 MED ORDER — BACITRACIN-NEOMYCIN-POLYMYXIN OINTMENT TUBE
TOPICAL_OINTMENT | CUTANEOUS | Status: DC | PRN
  Administered 2024-10-27: 1 via TOPICAL
  Filled 2024-10-24: qty 14.17

## 2024-10-24 MED ORDER — MAGIC MOUTHWASH
5.0000 mL | Freq: Three times a day (TID) | ORAL | Status: DC | PRN
  Administered 2024-10-24 – 2024-10-26 (×3): 5 mL via ORAL
  Filled 2024-10-24 (×4): qty 5

## 2024-10-24 MED ORDER — FERROUS SULFATE 325 (65 FE) MG PO TABS
325.0000 mg | ORAL_TABLET | Freq: Every day | ORAL | Status: DC
  Administered 2024-10-26: 325 mg via ORAL
  Filled 2024-10-24 (×5): qty 1

## 2024-10-24 MED ORDER — IBUPROFEN 400 MG PO TABS: 400.0000 mg | ORAL_TABLET | Freq: Four times a day (QID) | ORAL | Status: DC | PRN

## 2024-10-24 MED ORDER — ACETAMINOPHEN 325 MG PO TABS
650.0000 mg | ORAL_TABLET | Freq: Three times a day (TID) | ORAL | Status: DC
  Filled 2024-10-24: qty 2

## 2024-10-24 NOTE — Progress Notes (Signed)
" °   10/24/24 1300  Psych Admission Type (Psych Patients Only)  Admission Status Voluntary  Psychosocial Assessment  Patient Complaints Anxiety;Depression  Eye Contact Brief  Facial Expression Flat  Affect Depressed  Speech Logical/coherent  Interaction Assertive  Motor Activity Other (Comment) (WNL)  Appearance/Hygiene Unremarkable  Behavior Characteristics Unwilling to participate;Resistant to care  Mood Depressed;Preoccupied  Thought Process  Coherency Tangential  Content Preoccupation  Delusions None reported or observed  Perception Hallucinations  Hallucination None reported or observed  Judgment Poor  Confusion None  Danger to Self  Current suicidal ideation? Denies  Self-Injurious Behavior No self-injurious ideation or behavior indicators observed or expressed   Agreement Not to Harm Self Yes  Description of Agreement verbal  Danger to Others  Danger to Others None reported or observed  Danger to Others Abnormal  Harmful Behavior to others No threats or harm toward other people  Destructive Behavior No threats or harm toward property    "

## 2024-10-24 NOTE — Progress Notes (Signed)
(  Sleep Hours) - 3.5 hours (Any PRNs that were needed, meds refused, or side effects to meds)-  Pt refused all (Any disturbances and when (visitation, over night)- Pt up periodically loudly talking in room by herself.   (Concerns raised by the patient)-  Pt states If you all would quit coming in here I would be asleep.  Refused sleep medication, refused agitation medication. (SI/HI/AVH)-  Appears to be responding to auditory hallucinations

## 2024-10-24 NOTE — Group Note (Signed)
 Date:  10/24/2024 Time:  10:25 AM  Group Topic/Focus: Social Wellness  Patients participated in a charades activity designed to promote creativity, critical thinking, and peer interaction. The activity encouraged communication and engagement among group members. During the final 10 minutes of group, patients socialized with one another, further supporting socialization and interpersonal skills.    Participation Level:  Did Not Attend  Additional Comments:  Pt did not attend group  Kristi HERO [facility] 10/24/2024, 10:25 AM
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
## 2024-10-24 NOTE — Group Note (Signed)
 LCSW Group Therapy Note  Group Date: 10/24/2024 Start Time: 1000 End Time: 1100   Type of Therapy and Topic:  Group Therapy - Healthy vs Unhealthy Coping Skills  Participation Level:  Did Not Attend   Description of Group The focus of this group was to determine what unhealthy coping techniques typically are used by group members and what healthy coping techniques would be helpful in coping with various problems. Patients were guided in becoming aware of the differences between healthy and unhealthy coping techniques. Patients were asked to identify 2-3 healthy coping skills they would like to learn to use more effectively.  Therapeutic Goals Patients learned that coping is what human beings do all day long to deal with various situations in their lives Patients defined and discussed healthy vs unhealthy coping techniques Patients identified their preferred coping techniques and identified whether these were healthy or unhealthy Patients determined 2-3 healthy coping skills they would like to become more familiar with and use more often. Patients provided support and ideas to each other   Summary of Patient Progress:  NA   Therapeutic Modalities Cognitive Behavioral Therapy Motivational Interviewing  Evalene MALVA Quale, LCSWA 10/24/2024  12:22 PM

## 2024-10-24 NOTE — Plan of Care (Signed)
  Problem: Education: Goal: Mental status will improve Outcome: Not Progressing   Problem: Activity: Goal: Sleeping patterns will improve Outcome: Not Progressing

## 2024-10-24 NOTE — Group Note (Signed)
 Date:  10/24/2024 Time:  12:50 PM  Group Topic/Focus: Physical Wellness Group  Patients viewed two TED Talks focused on physical exercise and its benefits for mental health. Prior to viewing, patients engaged in discussion about their perceptions of physical wellness and mental health and related these concepts to their own experiences. Group members explored and weighed the positive and negative effects of physical activity as a coping mechanism, demonstrating insight and self-reflection.    Participation Level:  Did Not Attend  Additional Comments:  Pt did not attend group  Kristi HERO [facility] 10/24/2024, 12:50 PM

## 2024-10-24 NOTE — Plan of Care (Signed)

## 2024-10-24 NOTE — Group Note (Signed)
 Date:  10/24/2024 Time:  9:08 AM  Group Topic/Focus: Goals Group  Patients participated in an research scientist (life sciences) by sharing their favorite foods and their feelings about the weekend snow. Patients then completed worksheets focused on developing SMART goals and were encouraged to share recovery-related goals with the group. The group promoted positive social interaction, engagement, and provided a supportive environment for safe self-expression and vulnerability.   Participation Level:  Did Not Attend  Additional Comments:  Pt did not attend group  Kristi HERO [facility] 10/24/2024, 9:08 AM

## 2024-10-24 NOTE — Progress Notes (Signed)
 [facility] MD Progress Note  10/24/2024 10:29 AM Melinda Jimenez  MRN:  [id number]  Principal Problem: MDD (major depressive disorder), recurrent severe, without psychosis (HCC) Diagnosis: Principal Problem:   MDD (major depressive disorder), recurrent severe, without psychosis (HCC)   Reason for Admission:  Melinda Jimenez is a 39 year old female w/ hx of MDD, GAD, PTSD, BPD and past medical history of hypertension, prediabetes, and vitamin D  deficiency who presented to presented to [facility] on 10/21/2024 for headache and then recommended for inpatient behavioral health due to suicidal ideation with plan to take a bunch of pills (admitted on 10/22/2024, total  LOS: 2 days ).   ASSESSMENT: Patient ruminative regarding dental pain throughout assessment. Ordered magic mouthwash to aid with dental pain.  Plan to schedule Tylenol  and ordered as needed ibuprofen  to aid with dental pain.  Upon discussing discharge planning, patient grew more irritable and paranoid stating that I was observing demons suck out my teeth. She was resistant to further discussions regarding psychotropics. Will attempt to address dental pain and continue psychotropics for now as this is likely contributing to her irritability and insomnia. Current working diagnosis is MDD although there appears to be some psychotic symptoms are of unclear etiology.   PLAN: -Magic mouthwash as needed for dental pain - Schedule Tylenol  650 mg every 8 hours - Start ibuprofen  400 mg every 6 hours as needed for headaches - Nifedipine  30 mg daily for hypertension -Abilify  10 mg daily  Disposition Planning: -- Estimated LOS: 5 days --Estimated Discharge Date 10/27/24 --Barriers to Discharge: depression, psychosis -- Discharge Goals: Return home with outpatient referrals for mental health follow-up including medication management/psychotherapy  Subjective: The patient was seen and evaluated on the unit. On assessment today the patient reports not doing  well today as she had poor sleep last night due to severe dental pain likely contributing to headaches.  She reports eating overall okay.  She continues to endorse passive suicidal ideation without intent or plan.  She denies HI/AVH.  She reports she has been homeless again after being kicked out of relative's house as relative had mental health problems. She ruminated regarding dental pain throughout assessment and we discussed options for treatment.  As assessment continued, patient became more irritable and irate regarding assessment stating that a demon is sucking my teeth out and that I was there to observe this.  I discussed how I did not observe any demons although would assist with dental pain. Interview terminated as patient became more agitated.   Objective: Chart Review from last 24 hours:  The patient's chart was reviewed and nursing notes were reviewed. The patient's case was discussed in multidisciplinary team meeting.  - Overnight events to report per chart review / staff report: irritable and responding to internal stimuli - Patient took all prescribed medications refused all scheduled meds yesterday - Patient received the following PRNs: tylenol   Current Medications: Current Facility-Administered Medications  Medication Dose Route Frequency Provider Last Rate Last Admin   acetaminophen  (TYLENOL ) tablet 650 mg  650 mg Oral Q8H Lynnette Barter, MD       albuterol  (VENTOLIN  HFA) 108 (90 Base) MCG/ACT inhaler 2 puff  2 puff Inhalation Q4H PRN Collene Gouge I, NP       alum & mag hydroxide-simeth (MAALOX/MYLANTA) 200-200-20 MG/5ML suspension 30 mL  30 mL Oral Q4H PRN Mannie Jerel PARAS, NP       ARIPiprazole  (ABILIFY ) tablet 10 mg  10 mg Oral Daily Nwoko, Agnes I, NP  haloperidol  (HALDOL ) tablet 5 mg  5 mg Oral TID PRN Mannie Jerel PARAS, NP       And   diphenhydrAMINE  (BENADRYL ) capsule 50 mg  50 mg Oral TID PRN Mannie Jerel PARAS, NP       haloperidol  lactate (HALDOL ) injection 10 mg   10 mg Intramuscular TID PRN Mannie Jerel PARAS, NP       And   diphenhydrAMINE  (BENADRYL ) injection 50 mg  50 mg Intramuscular TID PRN Mannie Jerel PARAS, NP       And   LORazepam  (ATIVAN ) injection 2 mg  2 mg Intramuscular TID PRN Mannie Jerel PARAS, NP       haloperidol  lactate (HALDOL ) injection 5 mg  5 mg Intramuscular TID PRN Mannie Jerel PARAS, NP       And   diphenhydrAMINE  (BENADRYL ) injection 50 mg  50 mg Intramuscular TID PRN Mannie Jerel PARAS, NP       And   LORazepam  (ATIVAN ) injection 2 mg  2 mg Intramuscular TID PRN Mannie Jerel PARAS, NP       ferrous sulfate  tablet 325 mg  325 mg Oral Q breakfast Towana Leita SAILOR, MD       hydrOXYzine  (ATARAX ) tablet 25 mg  25 mg Oral Q8H PRN Collene Gouge I, NP       ibuprofen  (ADVIL ) tablet 400 mg  400 mg Oral Q6H PRN Lynnette Barter, MD       magic mouthwash  5 mL Oral TID PRN Lynnette Barter, MD       magnesium  hydroxide (MILK OF MAGNESIA) suspension 30 mL  30 mL Oral Daily PRN Mannie Jerel PARAS, NP       nicotine  (NICODERM CQ  - dosed in mg/24 hours) patch 21 mg  21 mg Transdermal Q0600 Mannie Jerel PARAS, NP       NIFEdipine  (PROCARDIA -XL/NIFEDICAL-XL) 24 hr tablet 30 mg  30 mg Oral Daily Nwoko, Gouge I, NP   30 mg at 10/23/24 1806   traZODone  (DESYREL ) tablet 50 mg  50 mg Oral QHS Collene Gouge I, NP        Lab Results: No results found for this or any previous visit (from the past 48 hours).  Blood Alcohol level:  Lab Results  Component Value Date   [facility] <15 10/21/2024   ETH <15 09/07/2024    Metabolic Labs: Lab Results  Component Value Date   HGBA1C 6.0 (H) 08/12/2024   MPG 125.5 08/12/2024   MPG 122.63 07/28/2024   No results found for: PROLACTIN Lab Results  Component Value Date   CHOL 126 08/12/2024   TRIG 93 08/12/2024   HDL 35 (L) 08/12/2024   CHOLHDL 3.6 08/12/2024   VLDL 19 08/12/2024   LDLCALC 72 08/12/2024   LDLCALC 71 07/29/2024    Physical Findings: AIMS: No  CIWA:    COWS:     Psychiatric Specialty Exam: General  Appearance: Casual; Disheveled   Eye Contact: Fair   Speech: Clear and Coherent; Normal Rate   Volume: Normal   Mood: Angry; Depressed   Affect: Flat   Thought Content: Rumination (dental pain)   Suicidal Thoughts: Suicidal Thoughts: Yes, Passive SI Passive Intent and/or Plan: Without Intent; Without Plan; Without Means to Carry Out; Without Access to Means   Homicidal Thoughts: Homicidal Thoughts: No   Thought Process: Disorganized   Orientation: Full (Time, Place and Person)     Memory: Remote Fair   Judgment: Poor   Insight: Poor   Concentration: Fair   Recall:  Fair   [employer] of Knowledge: Fair   Language: Fair   Psychomotor Activity: Psychomotor Activity: Normal   Assets: Manufacturing Systems Engineer; Desire for Improvement; Financial Resources/Insurance; Physical Health   Sleep: Sleep: Fair Number of Hours of Sleep: 6.5    Review of Systems ROS  Vital Signs: Blood pressure (!) 122/92, pulse (!) 103, temperature (!) 97.5 F (36.4 C), temperature source Oral, resp. rate 16, height 5' 7 (1.702 m), weight 101.2 kg, SpO2 100%. Body mass index is 34.93 kg/m. Physical Exam  I certify that inpatient services furnished can reasonably be expected to improve the patient's condition.   Signed: Prentice Espy, MD 10/24/2024, 10:29 AM

## 2024-10-24 NOTE — Group Note (Signed)
 Date:  10/24/2024 Time:  4:37 PM  Group Topic/Focus:  Rediscovering Joy:   The focus of this group is to explore various ways to relieve stress in a positive manner.    Participation Level:  Did Not Attend   Inocente PARAS [facility] 10/24/2024, 4:37 PM

## 2024-10-24 NOTE — Group Note (Unsigned)
 Date:  10/24/2024 Time:  9:05 PM  Group Topic/Focus:  Wrap-Up Group:   The focus of this group is to help patients review their daily goal of treatment and discuss progress on daily workbooks.     Participation Level:  {BHH PARTICIPATION OZCZO:77735}  Participation Quality:  {BHH PARTICIPATION QUALITY:22265}  Affect:  {BHH AFFECT:22266}  Cognitive:  {BHH COGNITIVE:22267}  Insight: {BHH Insight2:20797}  Engagement in Group:  {BHH ENGAGEMENT IN HMNLE:77731}  Modes of Intervention:  {BHH MODES OF INTERVENTION:22269}  Additional Comments:  ***  Bari Moats 10/24/2024, 9:05 PM

## 2024-10-24 NOTE — BHH Group Notes (Signed)
 Pt did not attend CSW group

## 2024-10-24 NOTE — Group Note (Signed)
 Date:  10/24/2024 Time:  1:57 PM  Group Topic/Focus: Karaoke Group   Patients participated in singing songs individually and as a group. The activity encouraged creativity, peer support, and confidence building, while utilizing music as a positive coping mechanism and means of self-expression    Participation Level:  Did Not Attend  Additional Comments:  Pt did not attend this group  Kristi HERO [facility] 10/24/2024, 1:57 PM

## 2024-10-24 NOTE — Progress Notes (Signed)
 Pt heard speaking loudly in room, becomes irate if staff enters room and speaks to her.  Pt appears to be responding to internal stimuli.  Pt refuses medication x 3 stating If you would stop coming in here constantly I'd be asleep right now.  I'm not even mad, I don't know you, and let's keep it that way!  Pt door to room very loud every time it is opened and this adds to Pt's agitation.  Will continue to monitor for safety, Pt presently observed to be safe in room, agitated with staff.  Will continue to monitor,  [facility] made aware.

## 2024-10-25 DIAGNOSIS — F332 Major depressive disorder, recurrent severe without psychotic features: Secondary | ICD-10-CM

## 2024-10-25 MED ORDER — AMOXICILLIN-POT CLAVULANATE 875-125 MG PO TABS
1.0000 | ORAL_TABLET | Freq: Two times a day (BID) | ORAL | Status: DC
  Administered 2024-10-25 – 2024-10-29 (×8): 1 via ORAL
  Filled 2024-10-25 (×4): qty 1
  Filled 2024-10-25: qty 3
  Filled 2024-10-25 (×6): qty 1

## 2024-10-25 MED ORDER — TRIAMCINOLONE ACETONIDE 0.1 % EX CREA
1.0000 | TOPICAL_CREAM | Freq: Two times a day (BID) | CUTANEOUS | Status: DC
  Administered 2024-10-25 – 2024-10-28 (×6): 1 via TOPICAL
  Filled 2024-10-25 (×2): qty 15

## 2024-10-25 MED ORDER — NAPROXEN 500 MG PO TABS
500.0000 mg | ORAL_TABLET | Freq: Two times a day (BID) | ORAL | Status: DC | PRN
  Administered 2024-10-27: 500 mg via ORAL
  Filled 2024-10-25 (×3): qty 1

## 2024-10-25 NOTE — Group Note (Signed)
 Date:  10/25/2024 Time:  9:15 PM  Group Topic/Focus:  Wrap-Up Group:   The focus of this group is to help patients review their daily goal of treatment and discuss progress on daily workbooks.    Participation Level:  Did Not Attend  Participation Quality:  Resistant  Affect:  Resistant  Cognitive:  Lacking  Insight: None  Engagement in Group:  None  Modes of Intervention:  Discussion  Additional Comments:  Patient did not attend group this evening   Melinda Jimenez 10/25/2024, 9:15 PM

## 2024-10-25 NOTE — Group Note (Signed)
 Date:  10/25/2024 Time:  11:47 AM  Group Topic/Focus:  Wellness Toolbox:   The focus of this group is to discuss various aspects of wellness, balancing those aspects and exploring ways to increase the ability to experience wellness.  Patients will create a wellness toolbox for use upon discharge.    Participation Level:  Did Not Attend   Melinda Jimenez 10/25/2024, 11:47 AM

## 2024-10-25 NOTE — Progress Notes (Signed)
(  Sleep Hours) - 7.25 (Any PRNs that were needed, meds refused, or side effects to meds)-  (Any disturbances and when (visitation, over night)- None (Concerns raised by the patient)- None (SI/HI/AVH)- Denies

## 2024-10-25 NOTE — Plan of Care (Signed)
  Problem: Education: Goal: Mental status will improve Outcome: Progressing   

## 2024-10-25 NOTE — Plan of Care (Signed)
   Problem: Education: Goal: Knowledge of [facility] Education information/materials will improve Outcome: Progressing Goal: Emotional status will improve Outcome: Progressing Goal: Mental status will improve Outcome: Progressing Goal: Verbalization of understanding the information provided will improve Outcome: Progressing   Problem: Activity: Goal: Interest or engagement in activities will improve Outcome: Progressing

## 2024-10-25 NOTE — Progress Notes (Signed)
 Patient is wide awake and can be heard belching and talking loudly to herself. When checking on her, she complained of a headache and constipation. Nurse has been notified. Patient was encouraged to go to the medication window but has yet to do so.

## 2024-10-25 NOTE — Group Note (Signed)
 Date:  10/25/2024 Time:  10:20 AM  Group Topic/Focus:  Goals Group:   The focus of this group is to help patients establish daily goals to achieve during treatment and discuss how the patient can incorporate goal setting into their daily lives to aide in recovery.    Participation Level:  Did Not Attend   Melinda Jimenez 10/25/2024, 10:20 AM

## 2024-10-25 NOTE — Progress Notes (Signed)
" °   10/24/24 2200  Psych Admission Type (Psych Patients Only)  Admission Status Voluntary  Psychosocial Assessment  Patient Complaints Anxiety;Depression  Eye Contact Brief  Facial Expression Flat  Affect Depressed  Speech Logical/coherent  Interaction Assertive  Motor Activity Slow  Appearance/Hygiene Unremarkable  Behavior Characteristics Unwilling to participate  Mood Depressed;Preoccupied  Thought Process  Coherency Tangential  Content Preoccupation  Delusions None reported or observed  Perception Hallucinations  Hallucination None reported or observed  Judgment Poor  Confusion None  Danger to Self  Current suicidal ideation? Denies  Self-Injurious Behavior No self-injurious ideation or behavior indicators observed or expressed   Agreement Not to Harm Self Yes  Description of Agreement Verbal  Danger to Others  Danger to Others None reported or observed  Danger to Others Abnormal  Harmful Behavior to others No threats or harm toward other people  Destructive Behavior No threats or harm toward property    "

## 2024-10-25 NOTE — Progress Notes (Addendum)
 Attempted to take pt her meds at bedside and explained medications and what Dr. Lynnette ordered.   Pt stated  I will take them at the  window.   Staff returned to the nursing station and pt did not come to the window for medications even after prompt.

## 2024-10-25 NOTE — Progress Notes (Signed)
" °   10/25/24 1000  Psych Admission Type (Psych Patients Only)  Admission Status Voluntary  Psychosocial Assessment  Patient Complaints Anxiety;Depression  Eye Contact Brief  Facial Expression Flat  Affect Depressed;Irritable  Speech Logical/coherent  Interaction Assertive  Motor Activity Other (Comment) (WDL)  Appearance/Hygiene Unremarkable  Behavior Characteristics Unwilling to participate  Mood Depressed;Preoccupied  Thought Process  Coherency Tangential  Content Preoccupation  Delusions None reported or observed  Perception Hallucinations  Hallucination None reported or observed  Judgment Poor  Confusion None  Danger to Self  Current suicidal ideation? Denies  Self-Injurious Behavior No self-injurious ideation or behavior indicators observed or expressed   Agreement Not to Harm Self Yes  Description of Agreement Verbal  Danger to Others  Danger to Others None reported or observed  Danger to Others Abnormal  Harmful Behavior to others No threats or harm toward other people  Destructive Behavior No threats or harm toward property    "

## 2024-10-25 NOTE — Progress Notes (Signed)
 [facility] MD Progress Note  10/25/2024 12:14 PM MARYCARMEN HAGEY  MRN:  [id number]  Principal Problem: Major depressive disorder, recurrent, severe with psychotic features (HCC) Diagnosis: Principal Problem:   Major depressive disorder, recurrent, severe with psychotic features (HCC)   Reason for Admission:  Melinda Jimenez is a 39 year old female w/ hx of MDD, GAD, PTSD, BPD and past medical history of hypertension, prediabetes, and vitamin D  deficiency who presented to presented to [facility] on 10/21/2024 for headache and then recommended for inpatient behavioral health due to suicidal ideation with plan to take a bunch of pills (admitted on 10/22/2024, total  LOS: 3 days ).   ASSESSMENT: Patient minimally engaged today during assessment. 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 Again refused abilify  but encouraged to take and will discuss if alternative antipsychotic option would be preferable tomorrow by oncoming provider. Patient able to maintain safety while on the unit. Noted to have rash concerning for pityriasis rosea. Triamcinolone  started for this although it will also self-resolve.   PLAN: -Magic mouthwash as needed for dental pain - Discontinue tylenol  as patient refused - start augmentin  bid for 5 days for dental caries (gram negative coverage) - ibuprofen  400 mg every 6 hours as needed for headaches - Nifedipine  30 mg daily for hypertension -Abilify  10 mg daily - triamcinolone  ointment bid   Disposition Planning: -- Estimated LOS: 5 days --Estimated Discharge Date 10/28/24 --Barriers to Discharge: depression, psychosis -- Discharge Goals: Return home with outpatient referrals for mental health follow-up including medication management/psychotherapy  Subjective: The patient was seen and evaluated on the unit. On assessment today the patient reports not doing well and asking for a different provider although does not state why. She denies HI/AVH. She  endorses passive SI. She reports still having dental pain and was amenable to using PRNs and antibiotics to have it initiated. Eating is fair. Sleep is poor due todental pain.    Objective: Chart Review from last 24 hours:  The patient's chart was reviewed and nursing notes were reviewed. The patient's case was discussed in multidisciplinary team meeting.  - Overnight events to report per chart review / staff report: irritable and responding to internal stimuli - Patient took all prescribed medications refused all scheduled meds yesterday - Patient received the following PRNs: tylenol   Current Medications: Current Facility-Administered Medications  Medication Dose Route Frequency Provider Last Rate Last Admin   albuterol  (VENTOLIN  HFA) 108 (90 Base) MCG/ACT inhaler 2 puff  2 puff Inhalation Q4H PRN Nwoko, Mac I, NP       alum & mag hydroxide-simeth (MAALOX/MYLANTA) 200-200-20 MG/5ML suspension 30 mL  30 mL Oral Q4H PRN Mannie Jerel PARAS, NP       ARIPiprazole  (ABILIFY ) tablet 10 mg  10 mg Oral Daily Nwoko, Mac I, NP       haloperidol  (HALDOL ) tablet 5 mg  5 mg Oral TID PRN Mannie Jerel PARAS, NP       And   diphenhydrAMINE  (BENADRYL ) capsule 50 mg  50 mg Oral TID PRN Mannie Jerel PARAS, NP       haloperidol  lactate (HALDOL ) injection 10 mg  10 mg Intramuscular TID PRN Mannie Jerel PARAS, NP       And   diphenhydrAMINE  (BENADRYL ) injection 50 mg  50 mg Intramuscular TID PRN Mannie Jerel PARAS, NP       And   LORazepam  (ATIVAN ) injection 2 mg  2 mg Intramuscular TID PRN Mannie Jerel PARAS, NP  haloperidol  lactate (HALDOL ) injection 5 mg  5 mg Intramuscular TID PRN Mannie Jerel PARAS, NP       And   diphenhydrAMINE  (BENADRYL ) injection 50 mg  50 mg Intramuscular TID PRN Mannie Jerel PARAS, NP       And   LORazepam  (ATIVAN ) injection 2 mg  2 mg Intramuscular TID PRN Mannie Jerel PARAS, NP       ferrous sulfate  tablet 325 mg  325 mg Oral Q breakfast Towana Leita SAILOR, MD       hydrOXYzine  (ATARAX )  tablet 25 mg  25 mg Oral Q8H PRN Collene Gouge I, NP       ibuprofen  (ADVIL ) tablet 400 mg  400 mg Oral Q6H PRN Lynnette Barter, MD       magic mouthwash  5 mL Oral TID PRN Lynnette Barter, MD   5 mL at 10/25/24 9090   magnesium  hydroxide (MILK OF MAGNESIA) suspension 30 mL  30 mL Oral Daily PRN Mannie Jerel PARAS, NP       neomycin -bacitracin -polymyxin (NEOSPORIN) ointment   Topical PRN Towana Leita SAILOR, MD   Given at 10/25/24 [id number]   nicotine  (NICODERM CQ  - dosed in mg/24 hours) patch 21 mg  21 mg Transdermal Q0600 Mannie Jerel PARAS, NP       NIFEdipine  (PROCARDIA -XL/NIFEDICAL-XL) 24 hr tablet 30 mg  30 mg Oral Daily Nwoko, Agnes I, NP   30 mg at 10/23/24 1806   traZODone  (DESYREL ) tablet 50 mg  50 mg Oral QHS Collene Gouge I, NP       triamcinolone  cream (KENALOG ) 0.1 % cream 1 Application  1 Application Topical Q12H Lynnette Barter, MD        Lab Results: No results found for this or any previous visit (from the past 48 hours).  Blood Alcohol level:  Lab Results  Component Value Date   [facility] <15 10/21/2024   ETH <15 09/07/2024    Metabolic Labs: Lab Results  Component Value Date   HGBA1C 6.0 (H) 08/12/2024   MPG 125.5 08/12/2024   MPG 122.63 07/28/2024   No results found for: PROLACTIN Lab Results  Component Value Date   CHOL 126 08/12/2024   TRIG 93 08/12/2024   HDL 35 (L) 08/12/2024   CHOLHDL 3.6 08/12/2024   VLDL 19 08/12/2024   LDLCALC 72 08/12/2024   LDLCALC 71 07/29/2024    Physical Findings: AIMS: No  CIWA:    COWS:     Psychiatric Specialty Exam: General Appearance: Casual; Disheveled   Eye Contact: Fair   Speech: Clear and Coherent; Normal Rate   Volume: Normal   Mood: Angry; Depressed   Affect: Flat   Thought Content: Rumination (dental pain)   Suicidal Thoughts: Suicidal Thoughts: Yes, Passive   Homicidal Thoughts: Homicidal Thoughts: No   Thought Process: Disorganized   Orientation: Full (Time, Place and Person)     Memory: Remote Fair   Judgment:  Poor   Insight: Poor   Concentration: Fair   Recall: [employer] of Knowledge: Fair   Language: Fair   Psychomotor Activity: Psychomotor Activity: Normal   Assets: Manufacturing Systems Engineer; Desire for Improvement; Financial Resources/Insurance; Physical Health   Sleep: Sleep: Fair    Review of Systems ROS  Vital Signs: Blood pressure 114/70, pulse 82, temperature (!) 97.5 F (36.4 C), temperature source Oral, resp. rate 18, height 5' 7 (1.702 m), weight 101.2 kg, SpO2 100%. Body mass index is 34.93 kg/m. Physical Exam  I certify that inpatient services furnished can reasonably  be expected to improve the patient's condition.   Signed: Prentice Espy, MD 10/25/2024, 12:14 PM

## 2024-10-26 MED ORDER — ARIPIPRAZOLE 10 MG PO TABS
10.0000 mg | ORAL_TABLET | Freq: Every day | ORAL | Status: DC
  Administered 2024-10-27 – 2024-10-28 (×2): 10 mg via ORAL
  Filled 2024-10-26 (×2): qty 1

## 2024-10-26 NOTE — Plan of Care (Signed)
   Problem: Education: Goal: Knowledge of [facility] Education information/materials will improve Outcome: Progressing   Problem: Activity: Goal: Interest or engagement in activities will improve Outcome: Progressing   Problem: Coping: Goal: Ability to verbalize frustrations and anger appropriately will improve Outcome: Progressing   Problem: Safety: Goal: Periods of time without injury will increase Outcome: Progressing

## 2024-10-26 NOTE — Progress Notes (Signed)
 [facility] MD Progress Note  10/26/2024 1:52 PM Melinda Jimenez  MRN:  [id number]  Principal Problem: Major depressive disorder, recurrent, severe with psychotic features (HCC) Diagnosis: Principal Problem:   Major depressive disorder, recurrent, severe with psychotic features (HCC) Active Problems:   MDD (major depressive disorder), recurrent severe, without psychosis (HCC)   Reason for Admission:  Melinda Jimenez is a 39 year old female w/ hx of MDD, GAD, PTSD, BPD and past medical history of hypertension, prediabetes, and vitamin D  deficiency who presented to presented to [facility] on 10/21/2024 for headache and then recommended for inpatient behavioral health due to suicidal ideation with plan to take a bunch of pills (admitted on 10/22/2024, total  LOS: 4 days ).   Today's Assessment Note: Today, Melinda Jimenez is observed in the milieu and is minimally engaged but cooperative with interview. She reports her mood as terrible, with frequent crying related to stressors involving her children and family situation. She denies current suicidal or homicidal ideation, intent, or plan. She denies hallucinations or delusions, though endorses talking to herself; nursing reports last night she was loudly talking to herself and appearing to respond to internal stimuli, raising continued concern for underlying personality pathology (borderline vs schizotypal) and possible psychotic features. She has not attended unit groups so far today. Appetite is decreased due to dental pain from caries; sleep was disrupted with multiple awakenings, and she declined PRN sleep medication. She has been nonadherent with psychiatric medications, including refusing Abilify  this morning due to sleepiness; after discussion, she agreed to reschedule Abilify  to bedtime to improve tolerability. She denies medication side effects otherwise. She remains compliant with oral antibiotics and reports improvement of her rash, consistent with resolving pityriasis  rosea, with continued benefit from topical triamcinolone . Blood pressure remains elevated (135/110); patient was educated on the importance of antihypertensive adherence and follow-up with primary care. She reports dental pain and indigestion symptoms and was encouraged to request PRN Maalox. She denies chest pain or other cardiac symptoms. Case reviewed with attending psychiatrist; plan is to continue current treatment regimen with Abilify  rescheduled per patient preference, encourage medication adherence, and consider antipsychotic LAI prior to discharge to support adherence.    PLAN: -Magic mouthwash as needed for dental pain - Discontinue tylenol  as patient refused (10/25/24) - Continue augmentin  bid for 5 days for dental caries (gram negative coverage) - Ibuprofen  400 mg every 6 hours as needed for headaches - Nifedipine  30 mg daily for hypertension - Abilify  10 mg daily at bedtime  - triamcinolone  ointment bid   Disposition Planning: -- Estimated LOS: 5 days --Estimated Discharge Date 10/28/24 --Barriers to Discharge: depression, psychosis -- Discharge Goals: Return home with outpatient referrals for mental health follow-up including medication management/psychotherapy   Objective: Chart Review from last 24 hours:  The patient's chart was reviewed and nursing notes were reviewed. The patient's case was discussed in multidisciplinary team meeting.  - Overnight events to report per chart review / staff report: patient can be heard belching and loudly responding to internal stimuli - Patient refused all scheduled meds yesterday - Patient received the following PRNs: Magic Mouthwash   Current Medications: Current Facility-Administered Medications  Medication Dose Route Frequency Provider Last Rate Last Admin   albuterol  (VENTOLIN  HFA) 108 (90 Base) MCG/ACT inhaler 2 puff  2 puff Inhalation Q4H PRN Nwoko, Agnes I, NP       alum & mag hydroxide-simeth (MAALOX/MYLANTA) 200-200-20 MG/5ML  suspension 30 mL  30 mL Oral Q4H PRN Mannie Jerel PARAS, NP  amoxicillin -clavulanate (AUGMENTIN ) 875-125 MG per tablet 1 tablet  1 tablet Oral Q12H Ji, Andrew, MD   1 tablet at 10/26/24 0904   [START ON 10/27/2024] ARIPiprazole  (ABILIFY ) tablet 10 mg  10 mg Oral QHS Rodel Glaspy H, NP       haloperidol  (HALDOL ) tablet 5 mg  5 mg Oral TID PRN Mannie Jerel PARAS, NP       And   diphenhydrAMINE  (BENADRYL ) capsule 50 mg  50 mg Oral TID PRN Mannie Jerel PARAS, NP       haloperidol  lactate (HALDOL ) injection 10 mg  10 mg Intramuscular TID PRN Mannie Jerel PARAS, NP       And   diphenhydrAMINE  (BENADRYL ) injection 50 mg  50 mg Intramuscular TID PRN Mannie Jerel PARAS, NP       And   LORazepam  (ATIVAN ) injection 2 mg  2 mg Intramuscular TID PRN Mannie Jerel PARAS, NP       haloperidol  lactate (HALDOL ) injection 5 mg  5 mg Intramuscular TID PRN Mannie Jerel PARAS, NP       And   diphenhydrAMINE  (BENADRYL ) injection 50 mg  50 mg Intramuscular TID PRN Mannie Jerel PARAS, NP       And   LORazepam  (ATIVAN ) injection 2 mg  2 mg Intramuscular TID PRN Mannie Jerel PARAS, NP       ferrous sulfate  tablet 325 mg  325 mg Oral Q breakfast Towana Leita SAILOR, MD   325 mg at 10/26/24 1124   hydrOXYzine  (ATARAX ) tablet 25 mg  25 mg Oral Q8H PRN Collene Gouge I, NP       magic mouthwash  5 mL Oral TID PRN Lynnette Barter, MD   5 mL at 10/26/24 1125   magnesium  hydroxide (MILK OF MAGNESIA) suspension 30 mL  30 mL Oral Daily PRN Mannie Jerel PARAS, NP       naproxen  (NAPROSYN ) tablet 500 mg  500 mg Oral BID PRN Butler, Laura N, MD       neomycin -bacitracin -polymyxin (NEOSPORIN) ointment   Topical PRN Towana Leita SAILOR, MD   Given at 10/25/24 [phone]   nicotine  (NICODERM CQ  - dosed in mg/24 hours) patch 21 mg  21 mg Transdermal Q0600 Mannie Jerel PARAS, NP       NIFEdipine  (PROCARDIA -XL/NIFEDICAL-XL) 24 hr tablet 30 mg  30 mg Oral Daily Nwoko, Gouge I, NP   30 mg at 10/26/24 1122   traZODone  (DESYREL ) tablet 50 mg  50 mg Oral QHS Collene Gouge I,  NP       triamcinolone  cream (KENALOG ) 0.1 % cream 1 Application  1 Application Topical Q12H Lynnette Barter, MD   1 Application at 10/26/24 [phone]    Lab Results: No results found for this or any previous visit (from the past 48 hours).  Blood Alcohol level:  Lab Results  Component Value Date   [facility] <15 10/21/2024   ETH <15 09/07/2024    Metabolic Labs: Lab Results  Component Value Date   HGBA1C 6.0 (H) 08/12/2024   MPG 125.5 08/12/2024   MPG 122.63 07/28/2024   No results found for: PROLACTIN Lab Results  Component Value Date   CHOL 126 08/12/2024   TRIG 93 08/12/2024   HDL 35 (L) 08/12/2024   CHOLHDL 3.6 08/12/2024   VLDL 19 08/12/2024   LDLCALC 72 08/12/2024   LDLCALC 71 07/29/2024    Physical Findings: AIMS: No  CIWA:    COWS:     Psychiatric Specialty Exam: General Appearance: Casual; Disheveled   Eye  Contact: Fair   Speech: Clear and Coherent; Normal Rate   Volume: Normal   Mood: Depressed; Irritable (Terrible)   Affect: Flat   Thought Content: Rumination (Dental pain)   Suicidal Thoughts: Suicidal Thoughts: No SI Active Intent and/or Plan: -- (Denies presence) SI Passive Intent and/or Plan: -- (Denies presence)    Homicidal Thoughts: Homicidal Thoughts: No    Thought Process: Disorganized   Orientation: Full (Time, Place and Person)     Memory: Remote Fair   Judgment: Poor   Insight: Poor   Concentration: Fair   Recall: [employer] of Knowledge: Fair   Language: Fair   Psychomotor Activity: Psychomotor Activity: Normal    Assets: Desire for Improvement; Resilience   Sleep: Sleep: Fair     Review of Systems Review of Systems  Psychiatric/Behavioral:  Positive for depression. Negative for hallucinations, substance abuse and suicidal ideas. The patient is nervous/anxious and has insomnia.   All other systems reviewed and are negative.   Vital Signs: Blood pressure (!) 135/110, pulse 97, temperature (!) 97.3 F (36.3 C),  temperature source Oral, resp. rate 18, height 5' 7 (1.702 m), weight 101.2 kg, SpO2 96%. Body mass index is 34.93 kg/m. Physical Exam Vitals and nursing note reviewed.  Constitutional:      General: She is not in acute distress.    Appearance: She is obese.  HENT:     Head: Normocephalic and atraumatic.     Mouth/Throat:     Pharynx: Oropharynx is clear.  Pulmonary:     Effort: No respiratory distress.  Musculoskeletal:        General: Normal range of motion.  Neurological:     Mental Status: She is alert and oriented to person, place, and time.     I certify that inpatient services furnished can reasonably be expected to improve the patient's condition.   Signed: Blair Chiquita Hint, NP 10/26/2024, 1:52 PMPatient ID: Levon JONETTA Sharps, female   DOB: 1986-03-24, 39 y.o.   MRN: [id number]

## 2024-10-26 NOTE — Plan of Care (Signed)
 Pt was isolated to her room for most of the day. Was ambivalent about taking her medication. Went to [employer] and ate adequately. Not attending groups. Denies SI/HI/SH/paranoia/VH. Endorses AH but says it is her alter ego and she enjoys talking to herself. She loves herself. She is distrustful of the medications and wants to be healed by God. Just before dinner she requested a 72 hour form and when handing it to her to sign she took it and went to her room to look it over but didn't sign it. Will continue to monitor.

## 2024-10-26 NOTE — Progress Notes (Signed)

## 2024-10-26 NOTE — [facility] Group Notes (Signed)
 [facility] Group Notes:  (Nursing/MHT/Case Management/Adjunct)  Date:  10/26/2024  Time:  11:05 PM  Type of Therapy:  Group Therapy  Participation Level:  Did Not Attend  Participation Quality:  Did not Attend  Affect:  Did not attend   Cognitive:  Did not attend   Insight:  None  Engagement in Group:  Did not attend  Modes of Intervention:  Did not attend  Summary of Progress/Problems: Patient did not attend the evening A.A. speaker's meeting.   Melinda Jimenez S 10/26/2024, 11:05 PM

## 2024-10-26 NOTE — Group Note (Signed)
 Date:  10/26/2024 Time:  10:28 AM  Group Topic/Focus:  Goals Group:   The focus of this group is to help patients establish daily goals to achieve during treatment and discuss how the patient can incorporate goal setting into their daily lives to aide in recovery.    Participation Level:  Did Not Attend   Melinda Jimenez 10/26/2024, 10:28 AM

## 2024-10-26 NOTE — Progress Notes (Signed)
(  Sleep Hours) - 7.5 (Any PRNs that were needed, meds refused, or side effects to meds)- none (Any disturbances and when (visitation, over night)- n/a (Concerns raised by the patient)- none (SI/HI/AVH)- Denies

## 2024-10-26 NOTE — Group Note (Signed)
 Date:  10/26/2024 Time:  11:44 AM  Group Topic/Focus:  Wellness Toolbox:   The focus of this group is to discuss various aspects of wellness, balancing those aspects and exploring ways to increase the ability to experience wellness.  Patients will create a wellness toolbox for use upon discharge.    Participation Level:  Did Not Attend   Melinda Jimenez 10/26/2024, 11:44 AM

## 2024-10-26 NOTE — Group Note (Signed)
 Occupational Therapy Group Note  Group Topic:Coping Skills  Group Date: 10/26/2024 Start Time: 1500 End Time: 1530 Facilitators: Dot Dallas MATSU, OT   Group Description: Group encouraged increased engagement and participation through discussion and activity focused on Coping Ahead. Patients were split up into teams and selected a card from a stack of positive coping strategies. Patients were instructed to act out/charade the coping skill for other peers to guess and receive points for their team. Discussion followed with a focus on identifying additional positive coping strategies and patients shared how they were going to cope ahead over the weekend while continuing hospitalization stay.  Therapeutic Goal(s): Identify positive vs negative coping strategies. Identify coping skills to be used during hospitalization vs coping skills outside of hospital/at home Increase participation in therapeutic group environment and promote engagement in treatment   Participation Level: Engaged   Participation Quality: Independent   Behavior: Appropriate   Speech/Thought Process: Relevant   Affect/Mood: Appropriate   Insight: Fair   Judgement: Fair      Modes of Intervention: Education  Patient Response to Interventions:  Attentive   Plan: Continue to engage patient in OT groups 2 - 3x/week.  10/26/2024  D[place] MATSU Dot, OT  Aria Jarrard, OT

## 2024-10-27 MED ORDER — PANTOPRAZOLE SODIUM 40 MG PO TBEC
40.0000 mg | DELAYED_RELEASE_TABLET | Freq: Every day | ORAL | Status: DC
  Filled 2024-10-27 (×2): qty 1

## 2024-10-27 MED ORDER — NICOTINE POLACRILEX 2 MG MT GUM: 2.0000 mg | CHEWING_GUM | OROMUCOSAL | Status: DC | PRN

## 2024-10-27 MED ORDER — DIPHENHYDRAMINE HCL 25 MG PO CAPS
25.0000 mg | ORAL_CAPSULE | Freq: Once | ORAL | Status: DC
  Filled 2024-10-27: qty 1

## 2024-10-27 NOTE — Group Note (Signed)
 Date:  10/27/2024 Time:  8:40 PM  Group Topic/Focus:  Wrap-Up Group:   The focus of this group is to help patients review their daily goal of treatment and discuss progress on daily workbooks.    Participation Level:  Did Not Attend  Participation Quality:  Resistant  Affect:  Resistant  Cognitive:  Lacking  Insight: None  Engagement in Group:  None  Modes of Intervention:  Discussion  Additional Comments:  Patient did not participate in wrap up group this evening   Bari Moats 10/27/2024, 8:40 PM

## 2024-10-27 NOTE — Progress Notes (Signed)
 [facility] MD Progress Note  10/27/2024 1:10 PM CARRIANNE HYUN  MRN:  [id number]  Principal Problem: Major depressive disorder, recurrent, severe with psychotic features (HCC) Diagnosis: Principal Problem:   Major depressive disorder, recurrent, severe with psychotic features (HCC) Active Problems:   MDD (major depressive disorder), recurrent severe, without psychosis (HCC)   Reason for Admission:  MARQUISHA NIKOLOV is a 39 year old female w/ hx of MDD, GAD, PTSD, BPD and past medical history of hypertension, prediabetes, and vitamin D  deficiency who presented to presented to [facility] on 10/21/2024 for headache and then recommended for inpatient behavioral health due to suicidal ideation with plan to take a bunch of pills (admitted on 10/22/2024, total  LOS: 5 days ).   Subjective: Today, Irais was seen in her room during rounds and reports her mood as Im here, noting improvement in both depressive and anxiety symptoms compared to yesterday. She denies suicidal or homicidal ideation, intent, or plan and denies hallucinations, delusions, or other psychotic symptoms. She reports adequate appetite, stating, Im eating. Regarding sleep, she reports waking around 2:00 AM due to headache. She denies side effects from her current psychiatric medications. She reports her rash is unchanged. She endorses ongoing indigestion and frequent belching and agreed to initiation of Protonix  after discussion of risks and benefits. She denies chest pain or other cardiac symptoms. She reports an upcoming court date on February 6 or 7, 2026. She states she does not use nicotine  products and does not require nicotine  replacement therapy.   Assessment: Today, Naara remains minimally engaged on the unit and continues to isolate to her room, with nursing documentation indicating she has not attended groups and slept approximately 7.5 hours overnight. To encourage participation and engagement, we will implement a respectful room lockout  during scheduled meals and group times. She does not appear to be responding to internal stimuli during interview, though she continues to exhibit somatic focus, including repeated belching. She remains intermittently nonadherent with medications, refusing antihypertensive medication again this morning and expressing distrust of medications per nursing notes, though she was compliant with psychotropic medications overnight. Gastroesophageal reflux symptoms persist, and Protonix  40 mg daily will be initiated. Her rash remains stable, consistent with resolving pityriasis rosea, and topical treatment will be continued. Blood pressure will continue to be monitored, and she was again educated on the importance of antihypertensive adherence and outpatient follow-up. Nursing notes indicate she requested but did not sign a 72-hour discharge form. The plan is to continue the current psychiatric regimen, encourage medication adherence and participation in unit programming, monitor somatic complaints, and continue coordination of care in anticipation of safe discharge planning.    PLAN: -Magic mouthwash as needed for dental pain - Discontinue tylenol  as patient refused (10/25/24) - Continue augmentin  bid for 5 days for dental caries (gram negative coverage) - Ibuprofen  400 mg every 6 hours as needed for headaches - Nifedipine  30 mg daily for hypertension - Abilify  10 mg daily at bedtime  - Consider LAI prior to discharge to support adherence.  - triamcinolone  ointment bid  - Start Protonix  EC 40 mg daily for acid reflux   Disposition Planning: -- Estimated LOS: 5 days -- Estimated Discharge Date 10/28/24 -- Barriers to Discharge: depression, psychosis -- Discharge Goals: Return home with outpatient referrals for mental health follow-up including medication management/psychotherapy   Objective: Chart Review from last 24 hours:  The patient's chart was reviewed and nursing notes were reviewed. The patient's  case was discussed in multidisciplinary team meeting.  -  Overnight events to report per chart review / staff report: patient can be heard belching and loudly responding to internal stimuli - Patient refused all scheduled meds yesterday - Patient received the following PRNs: Magic Mouthwash   Current Medications: Current Facility-Administered Medications  Medication Dose Route Frequency Provider Last Rate Last Admin   albuterol  (VENTOLIN  HFA) 108 (90 Base) MCG/ACT inhaler 2 puff  2 puff Inhalation Q4H PRN Nwoko, Mac I, NP       alum & mag hydroxide-simeth (MAALOX/MYLANTA) 200-200-20 MG/5ML suspension 30 mL  30 mL Oral Q4H PRN Mannie Jerel PARAS, NP   30 mL at 10/26/24 2053   amoxicillin -clavulanate (AUGMENTIN ) 875-125 MG per tablet 1 tablet  1 tablet Oral Q12H Ji, Andrew, MD   1 tablet at 10/27/24 1051   ARIPiprazole  (ABILIFY ) tablet 10 mg  10 mg Oral QHS Tanai Bouler H, NP       haloperidol  (HALDOL ) tablet 5 mg  5 mg Oral TID PRN Mannie Jerel PARAS, NP       And   diphenhydrAMINE  (BENADRYL ) capsule 50 mg  50 mg Oral TID PRN Mannie Jerel PARAS, NP       haloperidol  lactate (HALDOL ) injection 10 mg  10 mg Intramuscular TID PRN Mannie Jerel PARAS, NP       And   diphenhydrAMINE  (BENADRYL ) injection 50 mg  50 mg Intramuscular TID PRN Mannie Jerel PARAS, NP       And   LORazepam  (ATIVAN ) injection 2 mg  2 mg Intramuscular TID PRN Mannie Jerel PARAS, NP       haloperidol  lactate (HALDOL ) injection 5 mg  5 mg Intramuscular TID PRN Mannie Jerel PARAS, NP       And   diphenhydrAMINE  (BENADRYL ) injection 50 mg  50 mg Intramuscular TID PRN Mannie Jerel PARAS, NP       And   LORazepam  (ATIVAN ) injection 2 mg  2 mg Intramuscular TID PRN Mannie Jerel PARAS, NP       ferrous sulfate  tablet 325 mg  325 mg Oral Q breakfast Towana Leita SAILOR, MD   325 mg at 10/26/24 1124   hydrOXYzine  (ATARAX ) tablet 25 mg  25 mg Oral Q8H PRN Collene Mac I, NP       magic mouthwash  5 mL Oral TID PRN Lynnette Barter, MD   5 mL at 10/26/24  1125   magnesium  hydroxide (MILK OF MAGNESIA) suspension 30 mL  30 mL Oral Daily PRN Mannie Jerel PARAS, NP       naproxen  (NAPROSYN ) tablet 500 mg  500 mg Oral BID PRN Towana Leita SAILOR, MD   500 mg at 10/27/24 0125   neomycin -bacitracin -polymyxin (NEOSPORIN) ointment   Topical PRN Towana Leita SAILOR, MD   Given at 10/26/24 1902   NIFEdipine  (PROCARDIA -XL/NIFEDICAL-XL) 24 hr tablet 30 mg  30 mg Oral Daily Nwoko, Mac I, NP   30 mg at 10/26/24 1122   traZODone  (DESYREL ) tablet 50 mg  50 mg Oral QHS Collene Mac I, NP   50 mg at 10/26/24 2051   triamcinolone  cream (KENALOG ) 0.1 % cream 1 Application  1 Application Topical Q12H Lynnette Barter, MD   1 Application at 10/27/24 1053    Lab Results: No results found for this or any previous visit (from the past 48 hours).  Blood Alcohol level:  Lab Results  Component Value Date   [facility] <15 10/21/2024   ETH <15 09/07/2024    Metabolic Labs: Lab Results  Component Value Date   HGBA1C 6.0 (H)  08/12/2024   MPG 125.5 08/12/2024   MPG 122.63 07/28/2024   No results found for: PROLACTIN Lab Results  Component Value Date   CHOL 126 08/12/2024   TRIG 93 08/12/2024   HDL 35 (L) 08/12/2024   CHOLHDL 3.6 08/12/2024   VLDL 19 08/12/2024   LDLCALC 72 08/12/2024   LDLCALC 71 07/29/2024    Physical Findings: AIMS: No  CIWA:    COWS:     Psychiatric Specialty Exam: General Appearance: Appropriate for Environment (Wearing hospital gowns)   Eye Contact: Fair   Speech: Clear and Coherent   Volume: Normal   Mood: Depressed (I'm here.)   Affect: Blunt   Thought Content: Rumination (Dental pain)   Suicidal Thoughts: Suicidal Thoughts: No SI Active Intent and/or Plan: -- (Denies presence) SI Passive Intent and/or Plan: -- (Denies presence)    Homicidal Thoughts: Homicidal Thoughts: No    Thought Process: Coherent   Orientation: Full (Time, Place and Person)     Memory: Immediate Fair   Judgment: Poor   Insight: Poor    Concentration: Fair   Recall: [employer] of Knowledge: Fair   Language: Fair   Psychomotor Activity: Psychomotor Activity: Normal    Assets: Desire for Improvement; Resilience   Sleep: Sleep: Fair     Review of Systems Review of Systems  Psychiatric/Behavioral:  Positive for depression. Negative for hallucinations, substance abuse and suicidal ideas. The patient is nervous/anxious and has insomnia.   All other systems reviewed and are negative.   Vital Signs: Blood pressure 136/78, pulse 89, temperature (!) 97.3 F (36.3 C), temperature source Oral, resp. rate 18, height 5' 7 (1.702 m), weight 101.2 kg, SpO2 96%. Body mass index is 34.93 kg/m. Physical Exam Vitals and nursing note reviewed.  Constitutional:      General: She is not in acute distress.    Appearance: She is obese.  HENT:     Head: Normocephalic and atraumatic.     Mouth/Throat:     Pharynx: Oropharynx is clear.  Pulmonary:     Effort: No respiratory distress.  Musculoskeletal:        General: Normal range of motion.  Neurological:     Mental Status: She is alert and oriented to person, place, and time.     I certify that inpatient services furnished can reasonably be expected to improve the patient's condition.   Signed: Blair Chiquita Hint, NP 10/27/2024, 1:10 PMPatient ID: Levon JONETTA Sharps, female   DOB: 09-12-1986, 39 y.o.   MRN: [id number] Patient ID: NOHEMI NICKLAUS, female   DOB: May 16, 1986, 39 y.o.   MRN: [id number]

## 2024-10-27 NOTE — Progress Notes (Signed)
 Melinda Jimenez   Type of Note: Check-in  Spoke with patient this morning regarding [facility]. Pt reports she never followed up with them and they never followed up with her after discharge last time. Pt gave verbal permission for staff to reach out to them and ensure f/u at discharge.   This clinical research associate also received a message from licensed conveyancer regarding an individual from [facility]) who called in regards to patient and was requesting a call back from social work. Pt declined for this writer to reach out to [facility] stating what do they want with me and my insurance? No, it's none of their business.   This clinical research associate inquired about pt's discharge plans. Patient states it's really none of your business but I am going to book a room somewhere.  Pt pulled covers over her head and would not speak with this clinical research associate further. No needs voiced at this time.   Signed:  Brenya Taulbee, LCSW-A 10/27/2024  9:22 AM

## 2024-10-27 NOTE — Group Note (Signed)
 Recreation Therapy Group Note   Group Topic:Animal Assisted Therapy   Group Date: 10/27/2024 Start Time: 9051 End Time: 1028 Facilitators: Collin Hendley-McCall, LRT,CTRS Location: [address] Dayroom   Animal-Assisted Activity (AAA) Program Checklist/Progress Notes Patient Eligibility Criteria Checklist & Daily Group note for Rec Tx Intervention  AAA/T Program Assumption of Risk Form signed by Patient/ or Parent Legal Guardian Yes  Patient understands his/her participation is voluntary Yes  Behavioral Response:    Education: Charity Fundraiser, Appropriate Animal Interaction   Education Outcome: Acknowledges education.    Affect/Mood: N/A   Participation Level: Did not attend    Clinical Observations/Individualized Feedback:      Plan: Continue to engage patient in RT group sessions 2-3x/week.   Kristopher Delk-McCall, LRT,CTRS 10/27/2024 1:01 PM

## 2024-10-27 NOTE — Group Note (Signed)
 Date:  10/27/2024 Time:  9:51 AM  Group Topic/Focus: goals and orientation Goals Group:   The focus of this group is to help patients establish daily goals to achieve during treatment and discuss how the patient can incorporate goal setting into their daily lives to aide in recovery. Orientation:   The focus of this group is to educate the patient on the purpose and policies of crisis stabilization and provide a format to answer questions about their admission.  The group details unit policies and expectations of patients while admitted.    Participation Level:  Did Not Attend  Dolores CHRISTELLA Fredericks 10/27/2024, 9:51 AM

## 2024-10-27 NOTE — Group Note (Signed)
 LCSW Group Therapy Note   Group Date: 10/27/2024 Start Time: 1100 End Time: 1200   Participation:  did not attend  Type of Therapy:  Group Therapy  Topic:  Stronger Together:  Building Healthy Relationships  Objective:  To explore loneliness, boundaries, and safe ways to build relationships.  Goals: Recognize healthy vs. unhealthy relationships. Learn safe ways to connect with others. Strengthen communication and murphy oil.  Summary:  Participants discussed loneliness, healthy connections, and setting boundaries. They explored safe ways to meet people and shared personal experiences. Key insights were reinforced through discussion and quotes.  Therapeutic Modalities Used: Cognitive Behavioral Therapy (CBT) Elements - Identifying unhealthy relationship patterns, challenging negative thoughts about connection. Dialectical Behavior Therapy (DBT) Elements - Interpersonal effectiveness, setting and maintaining boundaries. Supportive Group Therapy - Peer discussion, shared experiences, and emotional validation.   Jacson Rapaport O Rebeckah Masih, LCSW 10/27/2024  12:25 PM

## 2024-10-27 NOTE — Plan of Care (Signed)
   Problem: Education: Goal: Emotional status will improve Outcome: Progressing Goal: Mental status will improve Outcome: Progressing

## 2024-10-27 NOTE — Progress Notes (Signed)
(  Sleep Hours) -7.75 (Any PRNs that were needed, meds refused, or side effects to meds)- Naproxen , Maalox (Any disturbances and when (visitation, over night)-pt has had several perceived needs this shift. Pt did participate in care and was med compliant this shift (Concerns raised by the patient)-  (SI/HI/AVH)-pt denied, but pt has been heard from staff attending to internal stimuli

## 2024-10-27 NOTE — Group Note (Signed)
 Date:  10/27/2024 Time:  12:52 PM  Group Topic/Focus:Social work- Investment Banker, Corporate Together:  Building Healthy Relationships  Building Self Esteem:   The Focus of this group is helping patients become aware of the effects of self-esteem on their lives, the things they and others do that enhance or undermine their self-esteem, seeing the relationship between their level of self-esteem and the choices they make and learning ways to enhance self-esteem.    Participation Level:  Did Not Attend   Melinda Jimenez 10/27/2024, 12:52 PM

## 2024-10-27 NOTE — Progress Notes (Signed)
 Nurse spoke to patient about the importance of attending groups and meals. She verbalized understanding and went to meals.

## 2024-10-27 NOTE — [facility] Group Notes (Signed)
 [facility] Group Notes:  (Nursing/MHT/Case Management/Adjunct)  Date:  10/27/2024  Time:  12:55 AM  Type of Therapy:  Psychoeducational Skills  Participation Level:  Did Not Attend  Participation Quality:  Did not attend   Affect:  Did not attend   Cognitive:  Did not attend   Insight:  None  Engagement in Group:  Did not attend   Modes of Intervention:  Did not attend   Summary of Progress/Problems: The patient did not attend wrap-up group last evening.   Caragh Gasper S 10/27/2024, 12:55 AM

## 2024-10-27 NOTE — Group Note (Signed)
 Date:  10/27/2024 Time:  5:57 PM  Group Topic/Focus:  Wellness Toolbox:   The focus of this group is to discuss various aspects of wellness, balancing those aspects and exploring ways to increase the ability to experience wellness.  Patients will create a wellness toolbox for use upon discharge.    Participation Level:  Did Not Attend  Avelina DELENA Humphreys 10/27/2024, 5:57 PM

## 2024-10-27 NOTE — Group Note (Signed)
 Date:  10/27/2024 Time:  4:32 PM  Group Topic/Focus: Drug and alcohol substance abuse Diagnosis Education:   The focus of this group is to discuss the major disorders that patients maybe diagnosed with.  Group discusses the importance of knowing what one's diagnosis is so that one can understand treatment and better advocate for oneself.    Participation Level:  Did Not Attend   Melinda Jimenez 10/27/2024, 4:32 PM

## 2024-10-27 NOTE — Group Note (Signed)
 Date:  10/27/2024 Time:  10:23 AM  Group Topic/Focus: Pet therapy Self Care:   The focus of this group is to help patients understand the importance of self-care in order to improve or restore emotional, physical, spiritual, interpersonal, and financial health.    Participation Level:  Did Not Attend  Dolores CHRISTELLA Fredericks 10/27/2024, 10:23 AM

## 2024-10-27 NOTE — Plan of Care (Signed)
  Problem: Education: Goal: Emotional status will improve Outcome: Progressing   Problem: Activity: Goal: Interest or engagement in activities will improve Outcome: Not Progressing   

## 2024-10-28 ENCOUNTER — Encounter (HOSPITAL_COMMUNITY): Payer: Self-pay

## 2024-10-28 NOTE — Plan of Care (Signed)
   Problem: Education: Goal: Knowledge of [facility] Education information/materials will improve Outcome: Progressing Goal: Emotional status will improve Outcome: Progressing Goal: Mental status will improve Outcome: Progressing Goal: Verbalization of understanding the information provided will improve Outcome: Progressing

## 2024-10-28 NOTE — Group Note (Signed)
 Date:  10/28/2024 Time:  4:39 PM  Group Topic/Focus:   Spiritual Wellness  Pt did attend spiritual wellness group   Malic Rosten D Dlynn Ranes 10/28/2024, 4:39 PM

## 2024-10-28 NOTE — Progress Notes (Incomplete)
 Pt and peer engaged in a verbal altercation. Pt stated she stepped in the hallway to check the time. Pt stated peer began to allege that she she was staring and talking to her in a aggressive manner. Peer told pt to do something about it and pt began to walk towards pt. Writer was able to redirect pt to go back into room.

## 2024-10-28 NOTE — Group Note (Signed)
 Date:  10/28/2024 Time:  10:31 AM  Group Topic/Focus:  Goals Group:   The focus of this group is to help patients establish daily goals to achieve during treatment and discuss how the patient can incorporate goal setting into their daily lives to aide in recovery. Orientation:   The focus of this group is to educate the patient on the purpose and policies of crisis stabilization and provide a format to answer questions about their admission.  The group details unit policies and expectations of patients while admitted.    Participation Level:  Did Not Attend   Melinda Jimenez 10/28/2024, 10:31 AM

## 2024-10-28 NOTE — Progress Notes (Signed)
 Patient went to breakfast but reports she was not hungry. She did not go to group but was not in her room during group time.

## 2024-10-28 NOTE — Group Note (Signed)
 Recreation Therapy Group Note   Group Topic:Problem Solving  Group Date: 10/28/2024 Start Time: 0932 End Time: 1001 Facilitators: Elihu Milstein-McCall, LRT,CTRS Location: [address] Dayroom   Group Topic: Communication, Team Building, Problem Solving  Goal Area(s) Addresses:  Patient will effectively work with peer towards shared goal.  Patient will identify skills used to make activity successful.  Patient will identify how skills used during activity can be used to reach post d/c goals.   Behavioral Response:   Intervention: STEM Activity  Activity: Landing Pad. In teams of 3-5, patients were given 12 plastic drinking straws and an equal length of masking tape. Using the materials provided, patients were asked to build a landing pad to catch a golf ball dropped from approximately 5 feet in the air. All materials were required to be used by the team in their design. LRT facilitated post-activity discussion.  Education: Pharmacist, Community, Scientist, Physiological, Discharge Planning   Education Outcome: Acknowledges education/In group clarification offered/Needs additional education.    Affect/Mood: N/A   Participation Level: Did not attend    Clinical Observations/Individualized Feedback:      Plan: Continue to engage patient in RT group sessions 2-3x/week.   Alnisa Hasley-McCall, LRT,CTRS 10/28/2024 1:12 PM

## 2024-10-28 NOTE — Progress Notes (Signed)
 Patient was polite when this writer went in to introduce myself as her nurse for this evening. Denies SI/HI and AVH. Pt c/o a rash on her arms and chest. Pt was reminded of room lock-out order and she got up to go to 2000 wrap up group. When patient came to medication window, she was in a hurry, speaking fast, being offensive, states, you know I can't take that antibiotic whole, you need to cut it in half, and stateing she didn't want the benadryl  for itching rash, she didn't want the cream, but did take it.

## 2024-10-28 NOTE — [facility] Group Notes (Signed)
 Adult Psychoeducational Group Note  Date:  10/28/2024 Time:  8:19 PM  Group Topic/Focus:  Wrap-Up Group:   The focus of this group is to help patients review their daily goal of treatment and discuss progress on daily workbooks.  Participation Level:  Active  Participation Quality:  Appropriate  Affect:  Appropriate  Cognitive:  Appropriate  Insight: Appropriate  Engagement in Group:  Engaged  Modes of Intervention:  Discussion  Additional Comments:  Denee said her day was a 1. Goal for today going home tomorrow coping skills staying to herself   Lang Donia Law 10/28/2024, 8:19 PM

## 2024-10-28 NOTE — Progress Notes (Signed)
(  Sleep Hours) -8.75 (Any PRNs that were needed, meds refused, or side effects to meds)-  (Any disturbances and when (visitation, over night)- (Concerns raised by the patient)- Pt verbalized concerns about her rash. It is the pts belief that the rash has begun to spread. Pt stated she is concerned and asked writer could this which be related to AIDS. Writer asked pt to elaborate on her concerns; pt stated she had a random sexual encounter  and they had unprotected sex. Pt stated she would like an STI panel ordered.  (SI/HI/AVH)-denied, but pt has been heard responfing to internal stimuli.Pt remains to have somatic symptoms.

## 2024-10-28 NOTE — Progress Notes (Signed)
 [facility] MD Progress Note  10/28/2024 10:27 AM Melinda Jimenez  MRN:  [id number]  Principal Problem: Major depressive disorder, recurrent, severe with psychotic features (HCC) Diagnosis: Principal Problem:   Major depressive disorder, recurrent, severe with psychotic features (HCC) Active Problems:   MDD (major depressive disorder), recurrent severe, without psychosis (HCC)   Reason for Admission:  Melinda Jimenez is a 39 year old female w/ hx of MDD, GAD, PTSD, BPD and past medical history of hypertension, prediabetes, and vitamin D  deficiency who presented to presented to [facility] on 10/21/2024 for headache and then recommended for inpatient behavioral health due to suicidal ideation with plan to take a bunch of pills (admitted on 10/22/2024, total  LOS: 6 days ).  Daily notes: Melinda Jimenez is seen in her room. Chart reviewed. The chart findings discussed with the treatment team. She presents alert, oriented & aware of situation. She is visible on the unit. The staff reports patient has not been attending group sessions. That she spends most of her time in her room. During this evaluation, she presents with a good affect, good eye contact & verbally responsive. She reports, I'm in a bad mood today. My tooth hurts. I need to see a dentist. I have been here almost a week, when am I going home? I sleep so-so at night. I do have a peer support person who is helping find me a place to live because I'm homeless. I have signed a 72 hour discharge notice yesterday. I need to see the social worker to ask her something. If you can discharge me tomorrow, I will just go to a hotel or motel because I'm homeless. Melinda Jimenez currently denies any SIHI, AVH, delusional thoughts or paranoia. She does not appear to be responding to any internal stimuli. Patient is likely to be discharged tomorrow morning..   Current Medications: Current Facility-Administered Medications  Medication Dose Route Frequency Provider Last Rate Last Admin    albuterol  (VENTOLIN  HFA) 108 (90 Base) MCG/ACT inhaler 2 puff  2 puff Inhalation Q4H PRN Nash Bolls I, NP       alum & mag hydroxide-simeth (MAALOX/MYLANTA) 200-200-20 MG/5ML suspension 30 mL  30 mL Oral Q4H PRN Mannie Jerel PARAS, NP   30 mL at 10/26/24 2053   amoxicillin -clavulanate (AUGMENTIN ) 875-125 MG per tablet 1 tablet  1 tablet Oral Q12H Ji, Andrew, MD   1 tablet at 10/28/24 9140   ARIPiprazole  (ABILIFY ) tablet 10 mg  10 mg Oral QHS Bennett, Christal H, NP   10 mg at 10/27/24 2151   diphenhydrAMINE  (BENADRYL ) capsule 25 mg  25 mg Oral Once Onuoha, Chinwendu V, NP       haloperidol  (HALDOL ) tablet 5 mg  5 mg Oral TID PRN Mannie Jerel PARAS, NP       And   diphenhydrAMINE  (BENADRYL ) capsule 50 mg  50 mg Oral TID PRN Mannie Jerel PARAS, NP       haloperidol  lactate (HALDOL ) injection 10 mg  10 mg Intramuscular TID PRN Mannie Jerel PARAS, NP       And   diphenhydrAMINE  (BENADRYL ) injection 50 mg  50 mg Intramuscular TID PRN Mannie Jerel PARAS, NP       And   LORazepam  (ATIVAN ) injection 2 mg  2 mg Intramuscular TID PRN Mannie Jerel PARAS, NP       haloperidol  lactate (HALDOL ) injection 5 mg  5 mg Intramuscular TID PRN Mannie Jerel PARAS, NP       And   diphenhydrAMINE  (BENADRYL ) injection 50  mg  50 mg Intramuscular TID PRN Mannie Jerel PARAS, NP       And   LORazepam  (ATIVAN ) injection 2 mg  2 mg Intramuscular TID PRN Mannie Jerel PARAS, NP       ferrous sulfate  tablet 325 mg  325 mg Oral Q breakfast Towana Leita SAILOR, MD   325 mg at 10/26/24 1124   hydrOXYzine  (ATARAX ) tablet 25 mg  25 mg Oral Q8H PRN Collene Gouge I, NP       magic mouthwash  5 mL Oral TID PRN Lynnette Barter, MD   5 mL at 10/26/24 1125   magnesium  hydroxide (MILK OF MAGNESIA) suspension 30 mL  30 mL Oral Daily PRN Mannie Jerel PARAS, NP       naproxen  (NAPROSYN ) tablet 500 mg  500 mg Oral BID PRN Butler, Laura N, MD   500 mg at 10/27/24 0125   neomycin -bacitracin -polymyxin (NEOSPORIN) ointment   Topical PRN Towana Leita SAILOR, MD   1 Application  at 10/27/24 2159   NIFEdipine  (PROCARDIA -XL/NIFEDICAL-XL) 24 hr tablet 30 mg  30 mg Oral Daily Lilymarie Scroggins I, NP   30 mg at 10/28/24 0859   pantoprazole  (PROTONIX ) EC tablet 40 mg  40 mg Oral Daily Bennett, Christal H, NP       traZODone  (DESYREL ) tablet 50 mg  50 mg Oral QHS Collene Gouge I, NP   50 mg at 10/26/24 2051   triamcinolone  cream (KENALOG ) 0.1 % cream 1 Application  1 Application Topical Q12H Lynnette Barter, MD   1 Application at 10/27/24 2152    Lab Results: No results found for this or any previous visit (from the past 48 hours).  Blood Alcohol level:  Lab Results  Component Value Date   [facility] <15 10/21/2024   ETH <15 09/07/2024    Metabolic Labs: Lab Results  Component Value Date   HGBA1C 6.0 (H) 08/12/2024   MPG 125.5 08/12/2024   MPG 122.63 07/28/2024   No results found for: PROLACTIN Lab Results  Component Value Date   CHOL 126 08/12/2024   TRIG 93 08/12/2024   HDL 35 (L) 08/12/2024   CHOLHDL 3.6 08/12/2024   VLDL 19 08/12/2024   LDLCALC 72 08/12/2024   LDLCALC 71 07/29/2024    Physical Findings: AIMS: No  CIWA:    COWS:     Psychiatric Specialty Exam: General Appearance: Appropriate for Environment (Wearing hospital gowns)   Eye Contact: Fair   Speech: Clear and Coherent   Volume: Normal   Mood: Depressed (I'm here.)   Affect: Blunt   Thought Content: Rumination (Dental pain)   Suicidal Thoughts: Suicidal Thoughts: No    Homicidal Thoughts: No data recorded    Thought Process: Coherent   Orientation: Full (Time, Place and Person)     Memory: Immediate Fair   Judgment: Poor   Insight: Poor   Concentration: Fair   Recall: [employer] of Knowledge: Fair   Language: Fair   Psychomotor Activity: No data recorded    Assets: Desire for Improvement; Resilience   Sleep: Sleep: Fair     Review of Systems Review of Systems  Constitutional:  Negative for chills, diaphoresis and fever.  HENT:  Negative for congestion and  sore throat.   Eyes:  Negative for blurred vision.  Respiratory:  Negative for cough, shortness of breath and wheezing.   Cardiovascular:  Negative for chest pain and palpitations.  Gastrointestinal:  Negative for abdominal pain, constipation, diarrhea, heartburn, nausea and vomiting.  Genitourinary:  Negative  for dysuria.  Musculoskeletal:  Negative for joint pain and myalgias.  Skin:  Negative for itching and rash.  Neurological:  Negative for dizziness, tingling, tremors, sensory change, speech change, focal weakness, seizures, loss of consciousness, weakness and headaches.  Endo/Heme/Allergies:        Allergies: Zithromax , Zofran .   Other.  Latex.  Psychiatric/Behavioral:  Positive for depression. Negative for hallucinations, memory loss, substance abuse and suicidal ideas. The patient is nervous/anxious and has insomnia.   All other systems reviewed and are negative.   Vital Signs: Blood pressure (!) 124/91, pulse 89, temperature 98.5 F (36.9 C), temperature source Oral, resp. rate 18, height 5' 7 (1.702 m), weight 101.2 kg, SpO2 99%. Body mass index is 34.93 kg/m. Physical Exam Vitals and nursing note reviewed.  Constitutional:      General: She is not in acute distress.    Appearance: She is obese.  HENT:     Head: Normocephalic and atraumatic.     Nose: Nose normal.     Mouth/Throat:     Pharynx: Oropharynx is clear.  Cardiovascular:     Pulses: Normal pulses.  Pulmonary:     Effort: No respiratory distress.  Genitourinary:    Comments: Deferred. Musculoskeletal:        General: Normal range of motion.  Skin:    General: Skin is dry.  Neurological:     General: No focal deficit present.     Mental Status: She is alert and oriented to person, place, and time.    Treatment Plan Summary: Daily contact with patient to assess and evaluate symptoms and progress in treatment and Medication management.  Principal/active diagnoses Major depressive disorder,  recurrent, severe with psychotic features (HCC) MDD (major depressive disorder), recurrent severe, without psychosis (HCC)   PLAN: -Magic mouthwash as needed for dental pain - Discontinue tylenol  as patient refused (10/25/24) - Continue augmentin  bid for 5 days for dental caries (gram negative coverage) - Ibuprofen  400 mg every 6 hours as needed for headaches - Nifedipine  30 mg daily for hypertension - Abilify  10 mg daily at bedtime  - Consider LAI prior to discharge to support adherence.  - triamcinolone  ointment bid  - Start Protonix  EC 40 mg daily for acid reflux   Disposition Planning: -- Estimated LOS: 5 days -- Estimated Discharge Date 10/28/24 -- Barriers to Discharge: depression, psychosis -- Discharge Goals: Return home with outpatient referrals for mental health follow-up including medication management/psychotherapy    I certify that inpatient services furnished can reasonably be expected to improve the patient's condition.   Signed: Mac Bolster, NP, pmhnp, fnp-bc. 10/28/2024, 10:27 AMPatient ID: Melinda Jimenez, female   DOB: 11-03-1985, 39 y.o.   MRN: [id number] Patient ID: Melinda Jimenez, female   DOB: May 13, 1986, 39 y.o.   MRN: [id number] Patient ID: Melinda Jimenez, female   DOB: 12-12-1985, 39 y.o.   MRN: [id number]

## 2024-10-28 NOTE — Progress Notes (Signed)
 Spiritual care group facilitated by Chaplain Rockie Sofia, [facility]  Group focused on topic of strength. Group members reflected on what thoughts and feelings emerge when they hear this topic. They then engaged in facilitated dialog around how strength is present in their lives. This dialog focused on representing what strength had been to them in their lives (images and patterns given) and what they saw as helpful in their life now (what they needed / wanted).  Activity drew on narrative framework.  Patient Progress: Melinda Jimenez attended group and actively engaged and participated in group conversation.  She did not like the direction that some group members were taking and tried to encourage them to think about their strengths.

## 2024-10-28 NOTE — Plan of Care (Signed)
   Problem: Education: Goal: Emotional status will improve Outcome: Progressing Goal: Mental status will improve Outcome: Progressing Goal: Verbalization of understanding the information provided will improve Outcome: Progressing

## 2024-10-28 NOTE — Group Note (Signed)
 Date:  10/28/2024 Time:  3:43 PM  Group Topic/Focus:   Pharmacy Group  Pt did attend pharmacy group   Daneya Hartgrove D Gracelyn Coventry 10/28/2024, 3:43 PM

## 2024-10-28 NOTE — BH IP Treatment Plan (Signed)
 Interdisciplinary Treatment and Diagnostic Plan Update  10/28/2024 Time of Session: UPDATE Melinda Jimenez MRN: [id number]  Principal Diagnosis: Major depressive disorder, recurrent, severe with psychotic features (HCC)  Secondary Diagnoses: Principal Problem:   Major depressive disorder, recurrent, severe with psychotic features (HCC) Active Problems:   MDD (major depressive disorder), recurrent severe, without psychosis (HCC)   Current Medications:  Current Facility-Administered Medications  Medication Dose Route Frequency Provider Last Rate Last Admin   albuterol  (VENTOLIN  HFA) 108 (90 Base) MCG/ACT inhaler 2 puff  2 puff Inhalation Q4H PRN Nwoko, Mac I, NP       alum & mag hydroxide-simeth (MAALOX/MYLANTA) 200-200-20 MG/5ML suspension 30 mL  30 mL Oral Q4H PRN Mannie Jerel PARAS, NP   30 mL at 10/26/24 2053   amoxicillin -clavulanate (AUGMENTIN ) 875-125 MG per tablet 1 tablet  1 tablet Oral Q12H Lynnette Barter, MD   1 tablet at 10/28/24 9140   ARIPiprazole  (ABILIFY ) tablet 10 mg  10 mg Oral QHS Bennett, Christal H, NP   10 mg at 10/27/24 2151   diphenhydrAMINE  (BENADRYL ) capsule 25 mg  25 mg Oral Once Onuoha, Chinwendu V, NP       haloperidol  (HALDOL ) tablet 5 mg  5 mg Oral TID PRN Mannie Jerel PARAS, NP       And   diphenhydrAMINE  (BENADRYL ) capsule 50 mg  50 mg Oral TID PRN Mannie Jerel PARAS, NP       haloperidol  lactate (HALDOL ) injection 10 mg  10 mg Intramuscular TID PRN Mannie Jerel PARAS, NP       And   diphenhydrAMINE  (BENADRYL ) injection 50 mg  50 mg Intramuscular TID PRN Mannie Jerel PARAS, NP       And   LORazepam  (ATIVAN ) injection 2 mg  2 mg Intramuscular TID PRN Mannie Jerel PARAS, NP       haloperidol  lactate (HALDOL ) injection 5 mg  5 mg Intramuscular TID PRN Mannie Jerel PARAS, NP       And   diphenhydrAMINE  (BENADRYL ) injection 50 mg  50 mg Intramuscular TID PRN Mannie Jerel PARAS, NP       And   LORazepam  (ATIVAN ) injection 2 mg  2 mg Intramuscular TID PRN Mannie Jerel PARAS, NP        ferrous sulfate  tablet 325 mg  325 mg Oral Q breakfast Towana Leita SAILOR, MD   325 mg at 10/26/24 1124   hydrOXYzine  (ATARAX ) tablet 25 mg  25 mg Oral Q8H PRN Collene Mac I, NP       magic mouthwash  5 mL Oral TID PRN Lynnette Barter, MD   5 mL at 10/26/24 1125   magnesium  hydroxide (MILK OF MAGNESIA) suspension 30 mL  30 mL Oral Daily PRN Mannie Jerel PARAS, NP       naproxen  (NAPROSYN ) tablet 500 mg  500 mg Oral BID PRN Towana Leita SAILOR, MD   500 mg at 10/27/24 0125   neomycin -bacitracin -polymyxin (NEOSPORIN) ointment   Topical PRN Towana Leita SAILOR, MD   1 Application at 10/27/24 2159   NIFEdipine  (PROCARDIA -XL/NIFEDICAL-XL) 24 hr tablet 30 mg  30 mg Oral Daily Nwoko, Mac I, NP   30 mg at 10/28/24 0859   pantoprazole  (PROTONIX ) EC tablet 40 mg  40 mg Oral Daily Bennett, Christal H, NP       traZODone  (DESYREL ) tablet 50 mg  50 mg Oral QHS Nwoko, Mac I, NP   50 mg at 10/26/24 2051   triamcinolone  cream (KENALOG ) 0.1 % cream 1 Application  1 Application  Topical Q12H Lynnette Barter, MD   1 Application at 10/27/24 2152   PTA Medications: Medications Prior to Admission  Medication Sig Dispense Refill Last Dose/Taking   acetaminophen  (TYLENOL ) 500 MG tablet Take 1,000 mg by mouth 2 (two) times daily as needed for headache or fever (pain).      albuterol  (VENTOLIN  HFA) 108 (90 Base) MCG/ACT inhaler Inhale 2 puffs into the lungs every 4 (four) hours as needed for wheezing or shortness of breath.      amoxicillin  (AMOXIL ) 500 MG capsule Take 500 mg by mouth daily as needed (head, tooth pain).      ARIPiprazole  (ABILIFY ) 15 MG tablet Take 1 tablet (15 mg total) by mouth daily. 14 tablet 0    hydrOXYzine  (ATARAX ) 25 MG tablet Take 1 tablet (25 mg total) by mouth every 8 (eight) hours as needed for anxiety.      NIFEdipine  (ADALAT  CC) 30 MG 24 hr tablet Take 1 tablet (30 mg total) by mouth daily. 30 tablet 0     Patient Stressors: Financial difficulties   Other: homelessness    Patient Strengths: Average or  above average intelligence  Capable of independent living  Land for treatment/growth  Physical Health   Treatment Modalities: Medication Management, Group therapy, Case management,  1 to 1 session with clinician, Psychoeducation, Recreational therapy.   Physician Treatment Plan for Primary Diagnosis: Major depressive disorder, recurrent, severe with psychotic features (HCC) Long Term Goal(s): Improvement in symptoms so as ready for discharge   Short Term Goals: Ability to identify and develop effective coping behaviors will improve Ability to maintain clinical measurements within normal limits will improve Compliance with prescribed medications will improve Ability to identify triggers associated with substance abuse/mental health issues will improve Ability to identify changes in lifestyle to reduce recurrence of condition will improve Ability to verbalize feelings will improve Ability to disclose and discuss suicidal ideas Ability to demonstrate self-control will improve  Medication Management: Evaluate patient's response, side effects, and tolerance of medication regimen.  Therapeutic Interventions: 1 to 1 sessions, Unit Group sessions and Medication administration.  Evaluation of Outcomes: Progressing  Physician Treatment Plan for Secondary Diagnosis: Principal Problem:   Major depressive disorder, recurrent, severe with psychotic features (HCC) Active Problems:   MDD (major depressive disorder), recurrent severe, without psychosis (HCC)  Long Term Goal(s): Improvement in symptoms so as ready for discharge   Short Term Goals: Ability to identify and develop effective coping behaviors will improve Ability to maintain clinical measurements within normal limits will improve Compliance with prescribed medications will improve Ability to identify triggers associated with substance abuse/mental health issues will improve Ability to  identify changes in lifestyle to reduce recurrence of condition will improve Ability to verbalize feelings will improve Ability to disclose and discuss suicidal ideas Ability to demonstrate self-control will improve     Medication Management: Evaluate patient's response, side effects, and tolerance of medication regimen.  Therapeutic Interventions: 1 to 1 sessions, Unit Group sessions and Medication administration.  Evaluation of Outcomes: Progressing   RN Treatment Plan for Primary Diagnosis: Major depressive disorder, recurrent, severe with psychotic features (HCC) Long Term Goal(s): Knowledge of disease and therapeutic regimen to maintain health will improve  Short Term Goals: Ability to verbalize feelings will improve, Ability to disclose and discuss suicidal ideas, and Ability to identify and develop effective coping behaviors will improve  Medication Management: RN will administer medications as ordered by provider, will assess and evaluate patient's response and  provide education to patient for prescribed medication. RN will report any adverse and/or side effects to prescribing provider.  Therapeutic Interventions: 1 on 1 counseling sessions, Psychoeducation, Medication administration, Evaluate responses to treatment, Monitor vital signs and CBGs as ordered, Perform/monitor CIWA, COWS, AIMS and Fall Risk screenings as ordered, Perform wound care treatments as ordered.  Evaluation of Outcomes: Progressing   LCSW Treatment Plan for Primary Diagnosis: Major depressive disorder, recurrent, severe with psychotic features (HCC) Long Term Goal(s): Safe transition to appropriate next level of care at discharge, Engage patient in therapeutic group addressing interpersonal concerns.  Short Term Goals: Engage patient in aftercare planning with referrals and resources, Increase social support, Increase ability to appropriately verbalize feelings, Identify triggers associated with mental  health/substance abuse issues, and Increase skills for wellness and recovery  Therapeutic Interventions: Assess for all discharge needs, 1 to 1 time with Social worker, Explore available resources and support systems, Assess for adequacy in community support network, Educate family and significant other(s) on suicide prevention, Complete Psychosocial Assessment, Interpersonal group therapy.  Evaluation of Outcomes: Progressing   Progress in Treatment: Attending groups: No. Participating in groups: No. Taking medication as prescribed: Taking some meds Toleration medication: Yes. Family/Significant other contact made: No, will contact:  declined consents for SPE Patient understands diagnosis: Yes. Discussing patient identified problems/goals with staff: Yes. Medical problems stabilized or resolved: Yes. Denies suicidal/homicidal ideation: Yes. Issues/concerns per patient self-inventory: No.   New problem(s) identified:  No   New Short Term/Long Term Goal(s):     medication stabilization, elimination of SI thoughts, development of comprehensive mental wellness plan.      Patient Goals:  patient was symptomatic and couldn't participate in the treatment team meeting   Discharge Plan or Barriers:  Patient recently admitted. CSW will continue to follow and assess for appropriate referrals and possible discharge planning.    Reason for Continuation of Hospitalization: Depression Homicidal ideation Medication stabilization Suicidal ideation   Estimated Length of Stay:  2-3 days  Last 3 [place] Suicide Severity Risk Score: Flowsheet Row Admission (Current) [facility] from 10/21/2024 in [facility] from 10/18/2024 in [facility]  C-SSRS RISK CATEGORY High Risk High Risk High Risk    Last [facility] 2/9 Scores:    02/17/2020   10:08 AM 05/14/2017    1:05 PM   Depression screen PHQ 2/9  Decreased Interest 3 0  Down, Depressed, Hopeless 3 0  PHQ - 2 Score 6 0  Altered sleeping 3 0  Tired, decreased energy 3 3  Change in appetite 1 0  Feeling bad or failure about yourself  3 0  Trouble concentrating 3 0  Moving slowly or fidgety/restless 2 0  Suicidal thoughts 2 0  PHQ-9 Score 23  3      Data saved with a previous flowsheet row definition    Scribe for Treatment Team: Jenkins LULLA Primer, [place] 10/28/2024 1:37 PM

## 2024-10-28 NOTE — Group Note (Signed)
 Date:  10/28/2024 Time:  11:00 AM  Recreational Therapy   Participation Level:  Did Not Attend   Melinda Jimenez 10/28/2024, 11:00 AM

## 2024-10-29 MED ORDER — AMOXICILLIN-POT CLAVULANATE 875-125 MG PO TABS: 1.0000 | ORAL_TABLET | Freq: Two times a day (BID) | ORAL | Status: AC

## 2024-10-29 MED ORDER — TRIAMCINOLONE ACETONIDE 0.1 % EX CREA: 1.0000 | TOPICAL_CREAM | Freq: Two times a day (BID) | CUTANEOUS | Status: AC

## 2024-10-29 MED ORDER — HYDROXYZINE HCL 25 MG PO TABS: 25.0000 mg | ORAL_TABLET | Freq: Three times a day (TID) | ORAL | 0 refills | Status: AC | PRN

## 2024-10-29 MED ORDER — PANTOPRAZOLE SODIUM 40 MG PO TBEC: 40.0000 mg | DELAYED_RELEASE_TABLET | Freq: Every day | ORAL | 0 refills | Status: AC

## 2024-10-29 MED ORDER — TRAZODONE HCL 50 MG PO TABS: 50.0000 mg | ORAL_TABLET | Freq: Every day | ORAL | 0 refills | Status: AC

## 2024-10-29 MED ORDER — NIFEDIPINE ER 30 MG PO TB24: 30.0000 mg | ORAL_TABLET | Freq: Every day | ORAL | 0 refills | Status: AC

## 2024-10-29 MED ORDER — ARIPIPRAZOLE 10 MG PO TABS: 10.0000 mg | ORAL_TABLET | Freq: Every day | ORAL | 0 refills | Status: AC

## 2024-10-29 MED ORDER — BACITRACIN-NEOMYCIN-POLYMYXIN OINTMENT TUBE: 1.0000 | TOPICAL_OINTMENT | CUTANEOUS | Status: AC | PRN

## 2024-10-29 MED ORDER — FERROUS SULFATE 325 (65 FE) MG PO TABS: 325.0000 mg | ORAL_TABLET | Freq: Every day | ORAL | 0 refills | Status: AC

## 2024-10-29 NOTE — Discharge Summary (Signed)
 " Physician Discharge Summary Note  Patient:  Melinda Jimenez is an 39 y.o., female  MRN:  [id number]  DOB:  05-01-1986  Patient phone:  [phone] (home)   Patient address:   [address]  [address] [place] [address],   Total Time spent with patient: 45 minutes  Date of Admission:  10/22/2024  Date of Discharge: 10-29-24  Reason for Admission: Complaint of dizziness & headache pain, and during her medical evaluation for her presenting medical symptoms, patient reported feeling suicidal.  Principal Problem: Major depressive disorder, recurrent, severe with psychotic features [facility])  Discharge Diagnoses: Principal Problem:   Major depressive disorder, recurrent, severe with psychotic features (HCC) Active Problems:   MDD (major depressive disorder), recurrent severe, without psychosis (HCC)  Past Psychiatric History: See H&P.  Past Medical History:  Past Medical History:  Diagnosis Date   Anemia    Anxiety    off meds with preg   Bipolar 1 disorder (HCC) 03/10/2024   Borderline personality disorder (HCC) 03/10/2024   Brief psychotic disorder (HCC) 04/03/2020   Carpal tunnel syndrome    Cesarean delivery delivered 10/10/2020   Chlamydia    Chronic hypertension    Chronic post-traumatic stress disorder (PTSD) 03/10/2024   COVID-19 09/12/2020   Genital HSV    Last OB: > 35yr ago; Valtrex  500 mg po prn outbreaks   GERD (gastroesophageal reflux disease)    GERD without esophagitis 01/20/2013   Gestational diabetes    diet controlled   Goiter, unspecified 04/18/2010   Centricity Description: THYROMEGALY  Qualifier: Diagnosis of   By: Lelon RIGGERS, Scott      Centricity Description: GOITER  Qualifier: Diagnosis of   By: Lelon RIGGERS, Scott      Replacing diagnoses that were inactivated after the 12/31/22 regulatory import     H/O: cesarean section 09/27/2016   last C-section for active HSV, C-section X1: 05/20/13; records in epic [facility]  TOLAC desired, signed 09/27/16       Headache(784.0)    Herpes genitalis in women 04/18/2010   Qualifier: Diagnosis of   By: Lelon RIGGERS, Scott         IBS (irritable bowel syndrome)    Infection    UTI   Intrauterine pregnancy 03/22/2020   EDC 11/14/2020     Panic attack    Premature rupture of membranes (PROM), onset of labor after 24 hours, antepartum, preterm, third trimester 10/10/2020   Pulmonary embolus (HCC) 10/18/2020   Thyroid  disease    Trichomonas    Vitamin D  deficiency 03/10/2024    Past Surgical History:  Procedure Laterality Date   CESAREAN SECTION N/A 05/20/2013   Procedure: CESAREAN SECTION;  Surgeon: Carlin DELENA Centers, MD;  Location: [facility] ORS;  Service: Obstetrics;  Laterality: N/A;   CESAREAN SECTION N/A 04/06/2017   Procedure: CESAREAN SECTION;  Surgeon: Lorence Ozell CROME, MD;  Location: [facility] BIRTHING SUITES;  Service: Obstetrics;  Laterality: N/A;   CESAREAN SECTION  10/10/2020   Procedure: CESAREAN SECTION;  Surgeon: Zina Jerilynn DELENA, MD;  Location: [facility] LD ORS;  Service: Obstetrics;;   INDUCED ABORTION  2009   VAGINAL DELIVERY  2007   Family History:  Family History  Problem Relation Age of Onset   Depression Mother    Anxiety disorder Mother    Breast cancer Other    Bipolar disorder Brother    Diabetes Maternal Grandmother    Cancer Maternal Grandmother    Hypertension Maternal Grandmother    Hypertension Paternal Grandmother    Autism Other  Schizophrenia Other    Family Psychiatric  History: See H&P.  Social History:  Social History   Substance and Sexual Activity  Alcohol Use No   Comment: Twice a month     Social History   Substance and Sexual Activity  Drug Use No    Social History   Socioeconomic History   Marital status: Single    Spouse name: Not on file   Number of children: 1   Years of education: Not on file   Highest education level: Not on file  Occupational History   Occupation: Unemployed  Tobacco Use   Smoking status: Passive Smoke Exposure - Never Smoker    Smokeless tobacco: Never   Tobacco comments:    was not a daily smoker, tried it  Advertising Account Planner   Vaping status: Never Used  Substance and Sexual Activity   Alcohol use: No    Comment: Twice a month   Drug use: No   Sexual activity: Yes    Birth control/protection: None    Comment: Patient is interested in implant  Other Topics Concern   Not on file  Social History Narrative   Not on file   Social Drivers of Health   Tobacco Use: Medium Risk (10/22/2024)   Patient History    Smoking Tobacco Use: Passive Smoke Exposure - Never Smoker    Smokeless Tobacco Use: Never    Passive Exposure: Yes  Financial Resource Strain: Not on file  Food Insecurity: Food Insecurity Present (10/22/2024)   Epic    Worried About Programme Researcher, Broadcasting/film/video in the Last Year: Sometimes true    Ran Out of Food in the Last Year: Sometimes true  Transportation Needs: Patient Declined (10/22/2024)   Epic    Lack of Transportation (Medical): Patient declined    Lack of Transportation (Non-Medical): Patient declined  Physical Activity: Not on file  Stress: Not on file  Social Connections: Unknown (02/05/2022)   Received from [facility]   Social Network    Social Network: Not on file  Depression [phone]): Not on file  Alcohol Screen: Low Risk (10/22/2024)   Alcohol Screen    Last Alcohol Screening Score (AUDIT): 0  Housing: Low Risk (10/22/2024)   Epic    Unable to Pay for Housing in the Last Year: No    Number of Times Moved in the Last Year: 0    Homeless in the Last Year: No  Utilities: Not At Risk (10/22/2024)   Epic    Threatened with loss of utilities: No  Health Literacy: Not on file   Hospital Course: (Per admission evaluation notes): 39 year old AA female with hx of mental illness, chronic. Admitted from the [facility] hospital ED with complaint of suicidal ideations. Chart review indicated that patient presented to the ED with complaint of dizziness & headache pain, and during her medical evaluation  for her presenting medical symptoms, patient reported feeling suicidal. She reported at the ED that she has been feeling depressed & was thinking about harming herself by taking bunch of pills. She was then transferred to the [facility] for further psychiatric evaluation/treatments. Patient was recently discharged from [facility] with a recommendation for an outpatient psychiatric follow-up visits & medication management with [facility].   This is one of several psychiatric admissions/discharge summaries from this [facility] for this 39 year old AA female with hx of chronic mental illness, multiple psychiatric admissions, substance use disorders, homelessness & medication noncompliance. She is known in this [facility] from her  previous admissions/treatments for her psychiatric symptoms. She was admitted to the [facility] this time around with complaint of dizziness & headache pain & during her medical evaluation for her presenting medical symptoms, patient reported feeling suicidal. She was recommended & transferred to the [facility] for further psychiatric evaluation/treatments. Besides being known in this [facility] for her mental health symptoms, [place] is also known to not adhere to her recommended treatment regimen/keeping her outpatient psychiatric appointments. Then, the issues with non-compliant to her recommended treatment regimen/keeping up with her outpatient psychiatric appointments could be blamed on her problems with chronic homelessness as a potential barrier to her mental health stabilization. She reported on this present admission that she does have a peer support person in the community helping her to find a suitable place to live.  After evaluation of her presenting symptoms this time around, Caleigha was recommended for mood stabilization treatments. The medication regimen for her presenting symptoms were discussed & with her consent initiated. She received, stabilized & was discharged on the medications as listed below on her discharge medication  lists. She was also enrolled, but seldom participated in the group counseling sessions being offered & held on this unit for her to learn coping skills. She presented on this admission, other significant chronic medical conditions that required treatment & monitoring. She was resumed/discharged on all her pertinent medications for those health issues . She tolerated her treatment regimen without any adverse effects or reactions reported.   Cloa's symptoms responded well to her treatment regimen warranting this discharge. This is evidenced by her reports of improved mood & absence of suicidal ideations. During the course of her hospitalization, the 15-minute checks were adequate to ensure [facility] safety.  Patient did not display any dangerous, violent or suicidal behavior on the unit. She interacted with the other patients & staff appropriately. She seldom participated in some group sessions/therapies. Her medications were addressed & adjusted to meet her needs. She was recommended for outpatient follow-up care & medication management upon discharge to assure her continuity of care.  At the time of discharge, patient is not reporting any acute suicidal/homicidal ideations. She feels more confident about her self-care & in taking her medications. She currently denies any new issues or concerns. Education and supportive counseling provided throughout her hospital stay & upon discharge.   Today upon her discharge evaluation with her treatment team, Anasophia shares she is doing well. She denies any other specific concerns. She is sleeping well. Her appetite is good. She denies other physical complaints. She denies AH/VH. She feels that her medications have been helpful & is in agreement to continue her current treatment regimen as recommended. She was able to engage in safety planning including plan to return to [facility] or contact emergency services if she feels unable to maintain her own safety or the safety of others. Pt  had no further questions, comments, or concerns. She left [facility] with all personal belongings in no apparent distress. Transportation per taxi cab. [facility] assisted with taxi fare.   Physical Findings: AIMS:  , ,  ,  ,  ,  ,   CIWA:    COWS:     Musculoskeletal: Strength & Muscle Tone: within normal limits Gait & Station: normal Patient leans: N/A   Psychiatric Specialty Exam:  Presentation  General Appearance:  Appropriate for Environment; Casual; Fairly Groomed  Eye Contact: Good  Speech: Clear and Coherent; Normal Rate  Speech Volume: Normal  Handedness: Right   Mood and Affect  Mood: Euthymic  Affect: Appropriate; Congruent   Thought Process  Thought Processes: Coherent; Goal Directed; Linear  Descriptions of Associations:Intact  Orientation:Full (Time, Place and Person)  Thought Content:Logical  History of Schizophrenia/Schizoaffective disorder:No  Duration of Psychotic Symptoms:N/A  Hallucinations:Hallucinations: None  Ideas of Reference:None  Suicidal Thoughts:Suicidal Thoughts: No  Homicidal Thoughts:Homicidal Thoughts: No   Sensorium  Memory: Immediate Good; Recent Good; Remote Good  Judgment: Fair  Insight: Fair   Art Therapist  Concentration: Good  Attention Span: Good  Recall: Good  Fund of Knowledge: Fair  Language: Good   Psychomotor Activity  Psychomotor Activity: Psychomotor Activity: Normal   Assets  Assets: Communication Skills; Desire for Improvement; Financial Resources/Insurance; Physical Health; Resilience; Social Support   Sleep  Sleep: Sleep: Good  Estimated Sleeping Duration (Last 24 Hours): 3.00-3.50 hours   Physical Exam: Physical Exam Vitals and nursing note reviewed.  HENT:     Head: Normocephalic.     Nose: Nose normal.     Mouth/Throat:     Pharynx: Oropharynx is clear.  Cardiovascular:     Rate and Rhythm: Normal rate.     Pulses: Normal pulses.  Pulmonary:      Effort: Pulmonary effort is normal.  Genitourinary:    Comments: Deferred. Musculoskeletal:        General: Normal range of motion.     Cervical back: Normal range of motion.  Skin:    General: Skin is dry.  Neurological:     General: No focal deficit present.     Mental Status: She is alert and oriented to person, place, and time. Mental status is at baseline.    Review of Systems  Constitutional:  Negative for chills, diaphoresis and fever.  HENT:  Negative for congestion and sore throat.   Respiratory:  Negative for cough, shortness of breath and wheezing.   Cardiovascular:  Negative for chest pain and palpitations.  Gastrointestinal:  Negative for abdominal pain, constipation, diarrhea, heartburn, nausea and vomiting.  Genitourinary:  Negative for dysuria.  Musculoskeletal:  Negative for joint pain and myalgias.  Skin:  Negative for itching and rash.  Neurological:  Negative for dizziness, tingling, tremors, sensory change, speech change, focal weakness, seizures, loss of consciousness, weakness and headaches.  Endo/Heme/Allergies:        Allergy lists: Azithromax, Zofran .   Other: Latex.   Blood pressure 115/84, pulse (!) 102, temperature 97.6 F (36.4 C), temperature source Oral, resp. rate 18, height 5' 7 (1.702 m), weight 101.2 kg, SpO2 98%. Body mass index is 34.93 kg/m.   Tobacco Use History[1] Tobacco Cessation:  N/A, patient does not currently use tobacco products  Blood Alcohol level:  Lab Results  Component Value Date   [facility] <15 10/21/2024   ETH <15 09/07/2024   Metabolic Disorder Labs:  Lab Results  Component Value Date   HGBA1C 6.0 (H) 08/12/2024   MPG 125.5 08/12/2024   MPG 122.63 07/28/2024   No results found for: PROLACTIN Lab Results  Component Value Date   CHOL 126 08/12/2024   TRIG 93 08/12/2024   HDL 35 (L) 08/12/2024   CHOLHDL 3.6 08/12/2024   VLDL 19 08/12/2024   LDLCALC 72 08/12/2024   LDLCALC 71 07/29/2024   See Psychiatric  Specialty Exam and Suicide Risk Assessment completed by Attending Physician prior to discharge.  Discharge destination:  Home  Is patient on multiple antipsychotic therapies at discharge:  No    Has Patient had three or more failed trials of antipsychotic monotherapy by history:  No  Recommended Plan  for Multiple Antipsychotic Therapies: NA  Allergies as of 10/29/2024       Reactions   Latex Itching   Zithromax  [azithromycin ] Hives, Nausea And Vomiting   Zofran  [ondansetron ] Nausea Only        Medication List     STOP taking these medications    acetaminophen  500 MG tablet Commonly known as: TYLENOL    amoxicillin  500 MG capsule Commonly known as: AMOXIL        TAKE these medications      Indication  albuterol  108 (90 Base) MCG/ACT inhaler Commonly known as: VENTOLIN  HFA Inhale 2 puffs into the lungs every 4 (four) hours as needed for wheezing or shortness of breath.  Indication: Asthma   amoxicillin -clavulanate 875-125 MG tablet Commonly known as: AUGMENTIN  Take 1 tablet by mouth every 12 (twelve) hours. For infection.  Indication: Infection.   ARIPiprazole  10 MG tablet Commonly known as: ABILIFY  Take 1 tablet (10 mg total) by mouth at bedtime. For mood control. What changed:  medication strength how much to take when to take this additional instructions  Indication: Mood control.   ferrous sulfate  325 (65 FE) MG tablet Take 1 tablet (325 mg total) by mouth daily with breakfast. (May by from over the counter): For anemia. Start taking on: October 30, 2024  Indication: Iron Deficiency   hydrOXYzine  25 MG tablet Commonly known as: ATARAX  Take 1 tablet (25 mg total) by mouth every 8 (eight) hours as needed for anxiety.  Indication: Feeling Anxious   neomycin -bacitracin -polymyxin Oint Commonly known as: NEOSPORIN Apply 1 Application topically as needed. (May buy from over the counter): For wound care.  Indication: Wound cae.   NIFEdipine  30 MG 24 hr  tablet Commonly known as: ADALAT  CC Take 1 tablet (30 mg total) by mouth daily. For hypertension. What changed: additional instructions  Indication: High Blood Pressure   pantoprazole  40 MG tablet Commonly known as: PROTONIX  Take 1 tablet (40 mg total) by mouth daily. For acid reflux. Start taking on: October 30, 2024  Indication: Gastroesophageal Reflux Disease   traZODone  50 MG tablet Commonly known as: DESYREL  Take 1 tablet (50 mg total) by mouth at bedtime. For sleep.  Indication: Trouble Sleeping   triamcinolone  cream 0.1 % Commonly known as: KENALOG  Apply 1 Application topically every 12 (twelve) hours. For skin rash  Indication: Atopic Dermatitis        Follow-up Information     [facility] Follow up on 10/30/2024.   Why: Please continue with this provider after discharge on 10/30/24 at 10:00 am for ACTT services (P:  [phone] [facility]). They will provide therapy and medication management. They will see you out in the community. Contact information: [address] [place] [address] [phone]                Plan Of Care/Follow-up recommendations:  Activity: as tolerated  Diet: heart healthy  Other: -Follow-up with your outpatient psychiatric provider -instructions on appointment date, time, and address (location) are provided to you in discharge paperwork.  -Take your psychiatric medications as prescribed at discharge - instructions are provided to you in the discharge paperwork  -Follow-up with outpatient primary care doctor and other specialists -for management of preventative medicine and chronic medical issues  -Testing: Follow-up with outpatient provider for abnormal lab results: NA  -If you are prescribed an atypical antipsychotic medication, we recommend that your outpatient psychiatrist follow routine screening for side effects within 3 months of discharge, including monitoring: AIMS scale, height, weight,  blood pressure,  fasting lipid panel, HbA1c, and fasting blood sugar.   -Recommend total abstinence from alcohol, tobacco, and other illicit drug use at discharge.   -If your psychiatric symptoms recur, worsen, or if you have side effects to your psychiatric medications, call your outpatient psychiatric provider, 911, [phone] or go to the nearest emergency department.  -If suicidal thoughts occur, immediately call your outpatient psychiatric provider, 911, [phone] or go to the nearest emergency department.  Signed: Mac Bolster, NP, pmhnp, fnp-bc. 10/29/2024, 10:26 AM           [1]  Social History Tobacco Use  Smoking Status Passive Smoke Exposure - Never Smoker  Smokeless Tobacco Never  Tobacco Comments   was not a daily smoker, tried it   "

## 2024-10-29 NOTE — Group Note (Signed)
 Date:  10/29/2024 Time:  9:55 AM  Group Topic/Focus: goals and orientation Goals Group:   The focus of this group is to help patients establish daily goals to achieve during treatment and discuss how the patient can incorporate goal setting into their daily lives to aide in recovery. Orientation:   The focus of this group is to educate the patient on the purpose and policies of crisis stabilization and provide a format to answer questions about their admission.  The group details unit policies and expectations of patients while admitted.    Participation Level:  Active  Participation Quality:  Appropriate  Affect:  Appropriate  Cognitive:  Alert and Appropriate  Insight: Appropriate  Engagement in Group:  Engaged  Modes of Intervention:  Orientation  Additional Comments:    Melinda Jimenez 10/29/2024, 9:55 AM

## 2024-10-29 NOTE — [facility] Suicide Risk Assessment (Signed)
 [facility] INPATIENT:  Family/Significant Other Suicide Prevention Education  Suicide Prevention Education:   Suicide Prevention Education was reviewed thoroughly with patient, including risk factors, warning signs, and what to do. Mobile Crisis services were described and that telephone number pointed out, with encouragement to patient to put this number in personal cell phone. Brochure was provided to patient to share with natural supports. Patient acknowledged the ways in which they are at risk, and how working through each of their issues can gradually start to reduce their risk factors. Patient was encouraged to think of the information in the context of people in their own lives. Patient denied having access to firearms Patient verbalized understanding of information provided. Patient endorsed a desire to live.

## 2024-10-29 NOTE — Transportation (Signed)
 10/29/2024  Melinda Jimenez DOB: [birth year] MRN: [id number]   RIDER WAIVER AND RELEASE OF LIABILITY  For the purposes of helping with transportation needs, [facility] partners with outside transportation providers (taxi companies, [employer], catering manager.) to give [facility] patients or other approved people the choice of on-demand rides Public Librarian) to our buildings for non-emergency visits.  By using [employer], I, the person signing this document, on behalf of myself and/or any legal minors (in my care using the [employer]), agree:  Science Writer given to me are supplied by independent, outside transportation providers who do not work for, or have any affiliation with, [employer]. [facility] is not a transportation company. [facility] has no control over the quality or safety of the rides I get using [employer]. [facility] has no control over whether any outside ride will happen on time or not. [facility] gives no guarantee on the reliability, quality, safety, or availability on any rides, or that no mistakes will happen. I know and accept that traveling by vehicle (car, truck, SVU, fleeta, bus, taxi, etc.) has risks of serious injuries such as disability, being paralyzed, and death. I know and agree the risk of using [employer] is mine alone, and not [employer]. [employer] are provided as is and as are available. The transportation providers are in charge for all inspections and care of the vehicles used to provide these rides. I agree not to take legal action against [facility] [facility], its agents, employees, officers, directors, representatives, insurers, attorneys, assigns, successors, subsidiaries, and affiliates at any time for any reasons related directly or indirectly to using [employer]. I also agree not to take legal action against [facility] or its affiliates for any injury, death, or damage to property caused by or related to using  [employer]. I have read this Waiver and Release of Liability, and I understand the terms used in it and their legal meaning. This Waiver is freely and voluntarily given with the understanding that my right (or any legal minors) to legal action against  relating to [employer] is knowingly given up to use these services.   I attest that I read the Ride Waiver and Release of Liability to Melinda Jimenez, gave Ms. Abram the opportunity to ask questions and answered the questions asked (if any). I affirm that Melinda Jimenez then provided consent for assistance with transportation.

## 2024-10-29 NOTE — [facility] Suicide Risk Assessment (Signed)
 [facility] INPATIENT:  Family/Significant Other Suicide Prevention Education  Suicide Prevention Education:  Patient Refusal for Family/Significant Other Suicide Prevention Education: The patient Melinda Jimenez has refused to provide written consent for family/significant other to be provided Family/Significant Other Suicide Prevention Education during admission and/or prior to discharge.  Physician notified.  Jenkins LULLA Primer 10/29/2024, 8:10 AM

## 2024-10-29 NOTE — Group Note (Signed)
 Date:  10/29/2024 Time:  10:04 AM  Group Topic/Focus: Nutrient group .Adequate nutrition plays a vital role in supporting mental health in adults. Key nutrient groups include omega-3 fatty acids, which support brain structure and mood regulation; B-complex vitamins, essential for neurotransmitter production and stress management; vitamin D , which is linked to emotional well-being; and important minerals such as magnesium , zinc, and iron that help regulate the nervous system and cognitive function. Protein is also critical, as it provides amino acids needed to produce brain chemicals like serotonin and dopamine, while antioxidants from fruits and vegetables protect brain cells from oxidative stress. Together, these nutrients support emotional balance, focus, and overall mental well-being.    Participation Level:  Active   Aubrielle Stroud M Naphtali Riede 10/29/2024, 10:04 AM

## 2024-10-29 NOTE — Progress Notes (Signed)
(  Sleep Hours) -4.25 as of 0530 (Any PRNs that were needed, meds refused, or side effects to meds)- refused trazodone  (Any disturbances and when (visitation, over night)-none (Concerns raised by the patient)- none (SI/HI/AVH)- denies all

## 2024-10-29 NOTE — Progress Notes (Addendum)
" °  [facility] Adult Case Management Discharge Plan :  Will you be returning to the same living situation after discharge:  No. Pt will discharge to the [facility] At discharge, do you have transportation home?: Yes,  CSW arranged [employer] for 1100 AM Do you have the ability to pay for your medications: Yes,  pt has active health insurance coverage   Release of information consent forms completed and in the chart;  Patient's signature needed at discharge.  Patient to Follow up at:  Follow-up Information     [facility] Follow up on 10/30/2024.   Why: Please continue with this provider after discharge on 10/30/24 at 10:00 am for ACTT services (P:  [phone] [facility]). They will provide therapy and medication management. They will see you out in the community. Contact information: [address] [place] [address] [phone]                 Next level of care provider has access to [facility] Link:no  Safety Planning and Suicide Prevention discussed: Yes,  with patient, declined consents to contact others     Has patient been referred to the Quitline?: Patient does not use tobacco/nicotine  products  Patient has been referred for addiction treatment: No known substance use disorder.  Jenkins LULLA Primer, LCSWA 10/29/2024, 9:40 AM "

## 2024-10-29 NOTE — [facility] Suicide Risk Assessment (Addendum)
 Suicide Risk Assessment  Discharge Assessment    [facility] Discharge Suicide Risk Assessment   Principal Problem: Major depressive disorder, recurrent, severe with psychotic features (HCC)  Discharge Diagnoses: Principal Problem:   Major depressive disorder, recurrent, severe with psychotic features (HCC) Active Problems:   MDD (major depressive disorder), recurrent severe, without psychosis (HCC)  Total Time spent with patient: 45 minutes  Musculoskeletal: Strength & Muscle Tone: within normal limits Gait & Station: normal Patient leans: N/A  Psychiatric Specialty Exam  Presentation  General Appearance:  Appropriate for Environment; Casual; Fairly Groomed  Eye Contact: Good  Speech: Clear and Coherent; Normal Rate  Speech Volume: Normal  Handedness: Right   Mood and Affect  Mood: Euthymic  Duration of Depression Symptoms: Greater than two weeks  Affect: Appropriate; Congruent   Thought Process  Thought Processes: Coherent; Goal Directed; Linear  Descriptions of Associations:Intact  Orientation:Full (Time, Place and Person)  Thought Content:Logical  History of Schizophrenia/Schizoaffective disorder:No  Duration of Psychotic Symptoms:N/A  Hallucinations:Hallucinations: None  Ideas of Reference:None  Suicidal Thoughts:Suicidal Thoughts: No  Homicidal Thoughts:Homicidal Thoughts: No   Sensorium  Memory: Immediate Good; Recent Good; Remote Good  Judgment: Fair  Insight: Fair   Art Therapist  Concentration: Good  Attention Span: Good  Recall: Good  Fund of Knowledge: Fair  Language: Good   Psychomotor Activity  Psychomotor Activity:Psychomotor Activity: Normal   Assets  Assets: Communication Skills; Desire for Improvement; Financial Resources/Insurance; Physical Health; Resilience; Social Support   Sleep  Sleep:Sleep: Good  Estimated Sleeping Duration (Last 24 Hours): 3.00-3.50 hours  Physical Exam: See The  discharge summary.  Blood pressure 115/84, pulse (!) 102, temperature 97.6 F (36.4 C), temperature source Oral, resp. rate 18, height 5' 7 (1.702 m), weight 101.2 kg, SpO2 98%. Body mass index is 34.93 kg/m.  Mental Status Per Nursing Assessment::   On Admission:  Suicidal ideation indicated by patient  Demographic Factors:  Adolescent or young adult, Low socioeconomic status, and Living alone  Loss Factors: NA  Historical Factors: Prior suicide attempts and Impulsivity  Risk Reduction Factors:   Responsible for children under 87 years of age, Sense of responsibility to family, Positive social support, Positive therapeutic relationship, and Positive coping skills or problem solving skills  Continued Clinical Symptoms:  Alcohol/Substance Abuse/Dependencies Previous Psychiatric Diagnoses and Treatments  Cognitive Features That Contribute To Risk:  Polarized thinking and Thought constriction (tunnel vision)    Suicide Risk:  Minimal: No identifiable suicidal ideation.  Patients presenting with no risk factors but with morbid ruminations; may be classified as minimal risk based on the severity of the depressive symptoms   Follow-up Information     [facility] Follow up on 10/30/2024.   Why: Please continue with this provider after discharge on 10/30/24 at 10:00 am for ACTT services (P:  [phone] [facility]). They will provide therapy and medication management. They will see you out in the community. Contact information: [address] [place] [address] [phone]                Plan Of Care/Follow-up recommendations:  See the discharge recommendation above.  Mac Bolster, NP, pmhnp, fnp-bc. 10/29/2024, 10:32 AM

## 2024-11-04 ENCOUNTER — Emergency Department (HOSPITAL_COMMUNITY)
Admission: EM | Admit: 2024-11-04 | Discharge: 2024-11-04 | Discharge status: Against Medical Advice | Payer: MEDICAID | Attending: Student | Admitting: Student

## 2024-11-04 ENCOUNTER — Other Ambulatory Visit: Payer: Self-pay

## 2024-11-04 ENCOUNTER — Encounter (HOSPITAL_COMMUNITY): Payer: Self-pay

## 2024-11-04 DIAGNOSIS — Z5321 Procedure and treatment not carried out due to patient leaving prior to being seen by health care provider: Secondary | ICD-10-CM | POA: Insufficient documentation

## 2024-11-04 DIAGNOSIS — R519 Headache, unspecified: Secondary | ICD-10-CM | POA: Insufficient documentation

## 2024-11-04 MED ORDER — HYDROCODONE-ACETAMINOPHEN 5-325 MG PO TABS
1.0000 | ORAL_TABLET | Freq: Once | ORAL | Status: AC
  Administered 2024-11-04: 1 via ORAL
  Filled 2024-11-04: qty 1

## 2024-11-04 NOTE — ED Notes (Signed)
 Pt not answering x2 in lobby.

## 2024-11-04 NOTE — ED Provider Triage Note (Signed)
 Emergency Medicine Provider Triage Evaluation Note  Melinda Jimenez , a 39 y.o. female  was evaluated in triage.  Pt complains of left sided headache that has reportedly been ongoing for some time but the time frame is not clear.  She denies vision changes, weakness. Pain radiates into her mouth  Review of Systems  Positive:  Negative:   Physical Exam  BP (!) 149/102   Pulse 82   Temp 98.1 F (36.7 C) (Oral)   Resp 16   Ht 5' 7 (1.702 m)   Wt 101.2 kg   SpO2 98%   BMI 34.93 kg/m  Gen:   Awake, no distress   Resp:  Normal effort  MSK:   Moves extremities without difficulty  Other:    Medical Decision Making  Medically screening exam initiated at 3:05 AM.  Appropriate orders placed.  Melinda Jimenez was informed that the remainder of the evaluation will be completed by another provider, this initial triage assessment does not replace that evaluation, and the importance of remaining in the ED until their evaluation is complete.     Logan Ubaldo [place], [place] 11/04/24 [phone]

## 2024-11-04 NOTE — ED Triage Notes (Signed)
 Pt BIB [facility] from a hotel c/o a headache since yesterday. Pt states is a 10/10 constant pain.   [facility] vitals  142/100  88 HR  18 rr   135 cbg
